# Patient Record
Sex: Male | Born: 1950 | Race: Black or African American | Hispanic: No | Smoking: Never smoker
Health system: Southern US, Community
[De-identification: ages and names within clinical notes are randomized; demographics above are authoritative.]

## PROBLEM LIST (undated history)

## (undated) DIAGNOSIS — I639 Cerebral infarction, unspecified: Secondary | ICD-10-CM

## (undated) DIAGNOSIS — S0990XA Unspecified injury of head, initial encounter: Secondary | ICD-10-CM

## (undated) DIAGNOSIS — F1010 Alcohol abuse, uncomplicated: Secondary | ICD-10-CM

## (undated) DIAGNOSIS — R011 Cardiac murmur, unspecified: Secondary | ICD-10-CM

## (undated) DIAGNOSIS — I4891 Unspecified atrial fibrillation: Secondary | ICD-10-CM

## (undated) DIAGNOSIS — G913 Post-traumatic hydrocephalus, unspecified: Secondary | ICD-10-CM

## (undated) DIAGNOSIS — I82409 Acute embolism and thrombosis of unspecified deep veins of unspecified lower extremity: Secondary | ICD-10-CM

## (undated) DIAGNOSIS — R569 Unspecified convulsions: Secondary | ICD-10-CM

## (undated) DIAGNOSIS — I10 Essential (primary) hypertension: Secondary | ICD-10-CM

## (undated) HISTORY — PX: IM NAILING TIBIA: SUR734

## (undated) HISTORY — PX: ANTERIOR CERVICAL DECOMP/DISCECTOMY FUSION: SHX1161

## (undated) HISTORY — PX: FRACTURE SURGERY: SHX138

## (undated) HISTORY — PX: BACK SURGERY: SHX140

## (undated) HISTORY — PX: TIBIA FRACTURE SURGERY: SHX806

---

## 1998-02-04 ENCOUNTER — Emergency Department (HOSPITAL_COMMUNITY): Admission: EM | Admit: 1998-02-04 | Discharge: 1998-02-04 | Payer: Self-pay | Admitting: Emergency Medicine

## 1998-03-30 ENCOUNTER — Emergency Department (HOSPITAL_COMMUNITY): Admission: EM | Admit: 1998-03-30 | Discharge: 1998-03-31 | Payer: Self-pay

## 1998-03-31 ENCOUNTER — Ambulatory Visit (HOSPITAL_COMMUNITY): Admission: RE | Admit: 1998-03-31 | Discharge: 1998-03-31 | Payer: Self-pay

## 1998-04-06 ENCOUNTER — Ambulatory Visit (HOSPITAL_COMMUNITY): Admission: RE | Admit: 1998-04-06 | Discharge: 1998-04-06 | Payer: Self-pay | Admitting: Urology

## 1998-04-13 ENCOUNTER — Ambulatory Visit (HOSPITAL_COMMUNITY): Admission: RE | Admit: 1998-04-13 | Discharge: 1998-04-13 | Payer: Self-pay | Admitting: Urology

## 1998-04-13 ENCOUNTER — Encounter: Payer: Self-pay | Admitting: Urology

## 2003-01-31 ENCOUNTER — Encounter: Payer: Self-pay | Admitting: Emergency Medicine

## 2003-01-31 ENCOUNTER — Emergency Department (HOSPITAL_COMMUNITY): Admission: EM | Admit: 2003-01-31 | Discharge: 2003-02-01 | Payer: Self-pay | Admitting: *Deleted

## 2003-02-01 ENCOUNTER — Encounter: Payer: Self-pay | Admitting: *Deleted

## 2003-02-13 ENCOUNTER — Emergency Department (HOSPITAL_COMMUNITY): Admission: EM | Admit: 2003-02-13 | Discharge: 2003-02-13 | Payer: Self-pay | Admitting: Emergency Medicine

## 2003-03-02 ENCOUNTER — Encounter: Payer: Self-pay | Admitting: Emergency Medicine

## 2003-03-02 ENCOUNTER — Emergency Department (HOSPITAL_COMMUNITY): Admission: EM | Admit: 2003-03-02 | Discharge: 2003-03-02 | Payer: Self-pay | Admitting: Emergency Medicine

## 2004-09-26 HISTORY — PX: INCISION AND DRAINAGE: SHX5863

## 2004-09-27 ENCOUNTER — Inpatient Hospital Stay (HOSPITAL_COMMUNITY): Admission: EM | Admit: 2004-09-27 | Discharge: 2004-10-01 | Payer: Self-pay | Admitting: Emergency Medicine

## 2005-07-29 HISTORY — PX: VENA CAVA FILTER PLACEMENT: SUR1032
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
## 2006-05-13 ENCOUNTER — Inpatient Hospital Stay (HOSPITAL_COMMUNITY): Admission: AC | Admit: 2006-05-13 | Discharge: 2006-06-11 | Payer: Self-pay

## 2006-05-28 ENCOUNTER — Ambulatory Visit: Payer: Self-pay | Admitting: Physical Medicine & Rehabilitation

## 2006-05-29 DIAGNOSIS — S0990XA Unspecified injury of head, initial encounter: Secondary | ICD-10-CM

## 2006-05-29 HISTORY — DX: Unspecified injury of head, initial encounter: S09.90XA

## 2006-06-11 ENCOUNTER — Inpatient Hospital Stay (HOSPITAL_COMMUNITY)
Admission: RE | Admit: 2006-06-11 | Discharge: 2006-07-04 | Payer: Self-pay | Admitting: Physical Medicine & Rehabilitation

## 2006-06-11 ENCOUNTER — Ambulatory Visit: Payer: Self-pay | Admitting: Physical Medicine & Rehabilitation

## 2006-06-28 DIAGNOSIS — I82409 Acute embolism and thrombosis of unspecified deep veins of unspecified lower extremity: Secondary | ICD-10-CM

## 2006-06-28 HISTORY — DX: Acute embolism and thrombosis of unspecified deep veins of unspecified lower extremity: I82.409

## 2006-07-13 ENCOUNTER — Emergency Department (HOSPITAL_COMMUNITY): Admission: EM | Admit: 2006-07-13 | Discharge: 2006-07-14 | Payer: Self-pay | Admitting: Emergency Medicine

## 2006-07-15 ENCOUNTER — Inpatient Hospital Stay (HOSPITAL_COMMUNITY): Admission: EM | Admit: 2006-07-15 | Discharge: 2006-08-05 | Payer: Self-pay | Admitting: Emergency Medicine

## 2006-07-21 ENCOUNTER — Encounter: Payer: Self-pay | Admitting: Vascular Surgery

## 2006-08-25 ENCOUNTER — Encounter: Admission: RE | Admit: 2006-08-25 | Discharge: 2006-08-25 | Payer: Self-pay | Admitting: Internal Medicine

## 2006-11-27 DIAGNOSIS — G913 Post-traumatic hydrocephalus, unspecified: Secondary | ICD-10-CM

## 2006-11-27 HISTORY — PX: CSF SHUNT: SHX92

## 2006-11-27 HISTORY — DX: Post-traumatic hydrocephalus, unspecified: G91.3

## 2006-12-03 ENCOUNTER — Encounter: Admission: RE | Admit: 2006-12-03 | Discharge: 2006-12-03 | Payer: Self-pay | Admitting: Neurosurgery

## 2006-12-19 ENCOUNTER — Inpatient Hospital Stay (HOSPITAL_COMMUNITY): Admission: RE | Admit: 2006-12-19 | Discharge: 2006-12-21 | Payer: Self-pay | Admitting: Neurosurgery

## 2007-04-22 ENCOUNTER — Emergency Department (HOSPITAL_COMMUNITY): Admission: EM | Admit: 2007-04-22 | Discharge: 2007-04-23 | Payer: Self-pay | Admitting: Emergency Medicine

## 2007-05-25 ENCOUNTER — Emergency Department (HOSPITAL_COMMUNITY): Admission: EM | Admit: 2007-05-25 | Discharge: 2007-05-26 | Payer: Self-pay | Admitting: *Deleted
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
## 2007-05-31 IMAGING — CR DG ABD PORTABLE 1V
1 series · 1 of 1 positions shown · non-contrast
Comparison: none

CLINICAL DATA: Orogastric tube placement

Portable abdomen at 7357:
OG tube extends into the decompressed gastric fundus. Visualized bowel gas
pattern unremarkable. Visualized lung bases clear.

[view not recorded]
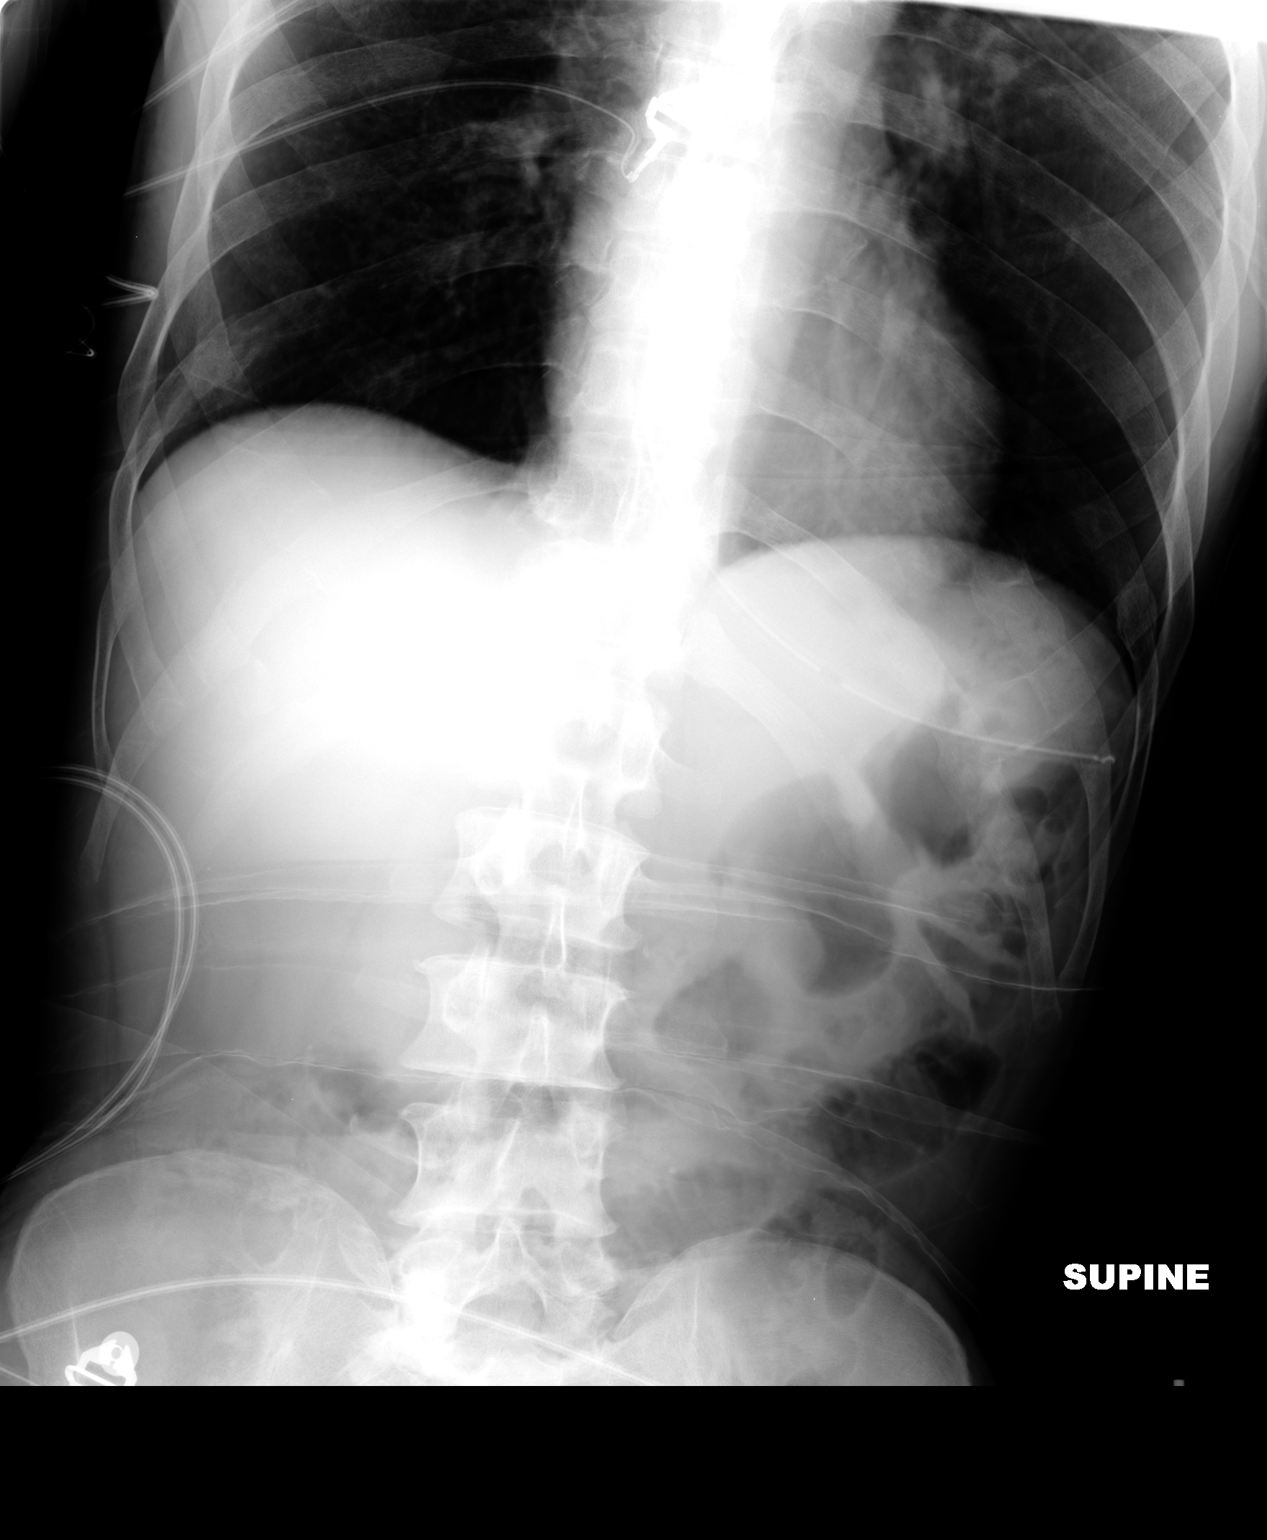

[1 of 1 positions shown; findings below may reference images not displayed]

IMPRESSION: 1. Orogastric tube to the gastric fundus
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
## 2007-06-01 IMAGING — CR DG CHEST 1V PORT
1 series · 1 of 1 positions shown · non-contrast
Comparison: none

CLINICAL DATA: PICC line placement.
PORTABLE SEMIERECT CHEST - 1 VIEW - 05/15/06 AT [DATE] P.M.:
Comparison Examination:   05/15/06 at [DATE] a.m.

[view not recorded]
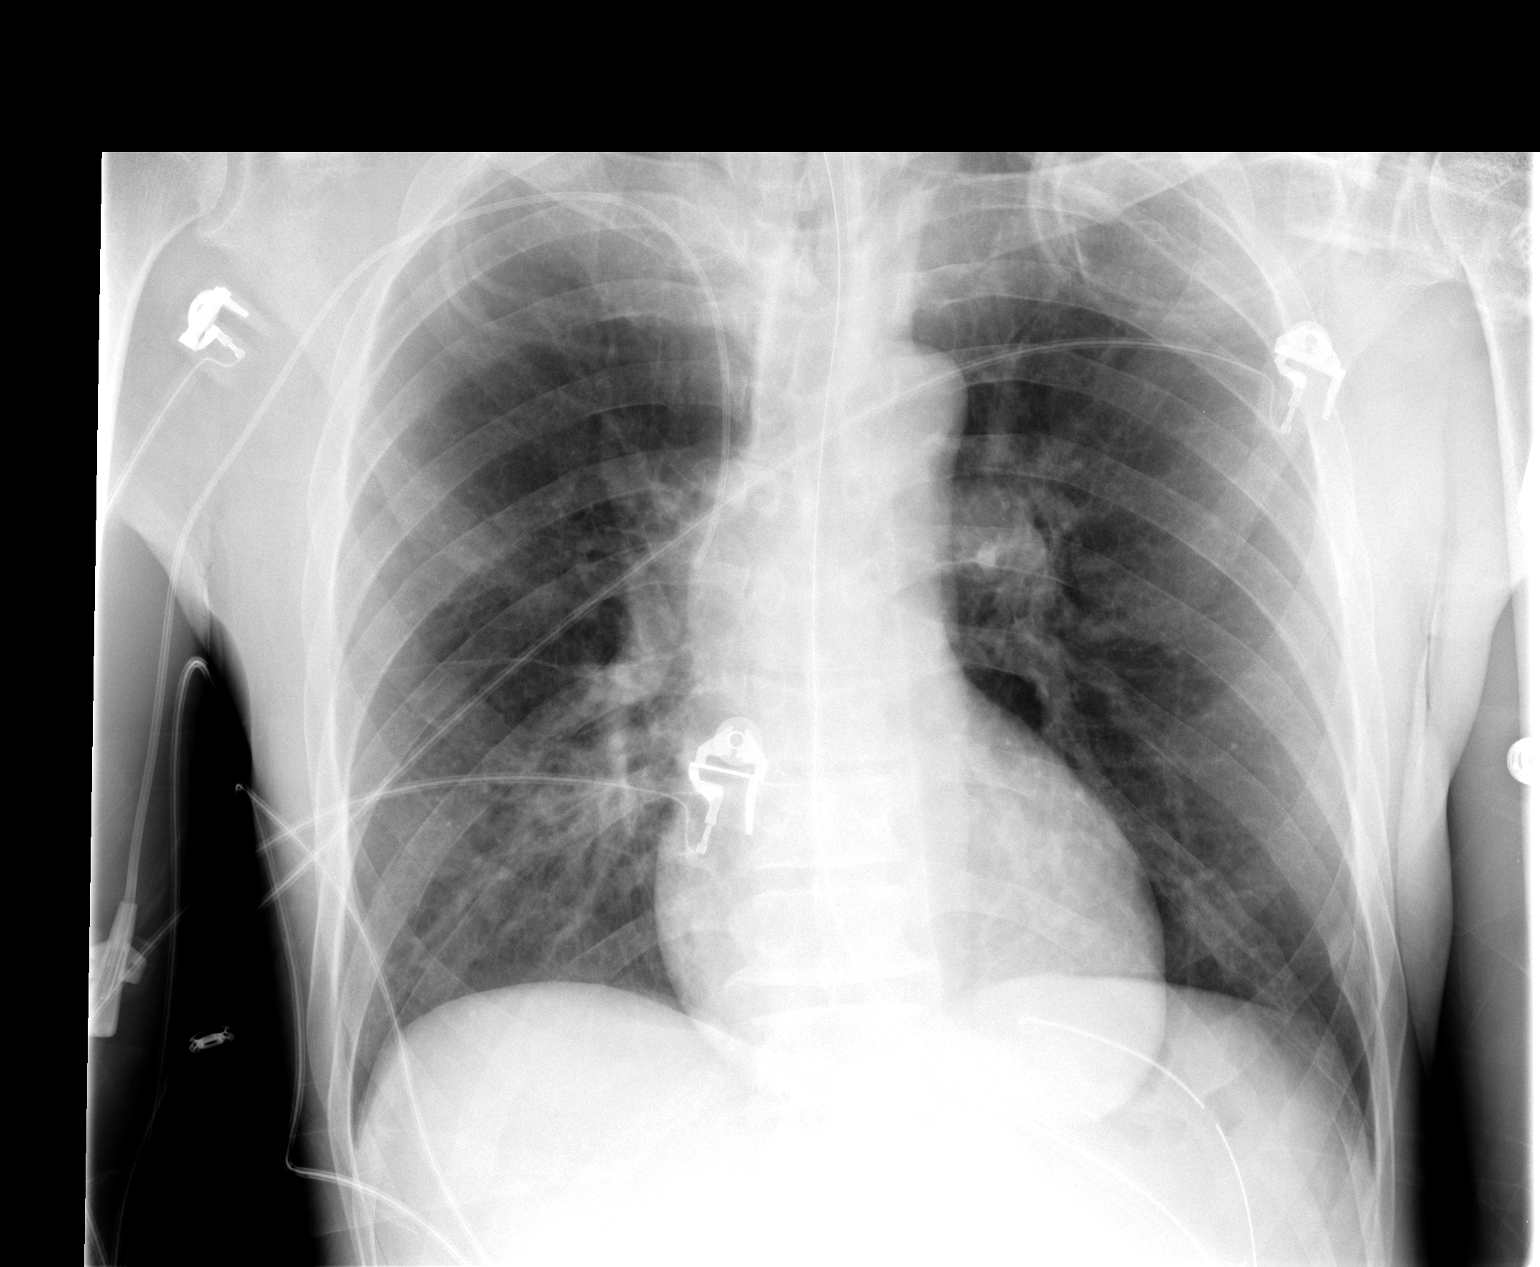

[1 of 1 positions shown; findings below may reference images not displayed]

FINDINGS: ight PICC line tip is at the mid superior vena cava level.  Now better visualized is the well-defined left heart border.  This raises the possibility of subtle pneumomediastinum or medially located left-sided pneumothorax.  This could be evaluated on close follow-up.  Remaining findings are without change.  The floor was contacted.
IMPRESSION: 1.  Right PICC line tip in mid superior vena cava.  
2.  Well-demarcated left heart border raising the possibility of a small left pneumomediastinum/pneumothorax and can be further delineated on close follow-up.
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
## 2007-06-06 IMAGING — CR DG CHEST 1V PORT
1 series · 1 of 1 positions shown · non-contrast
Comparison: 05/19/06.

CLINICAL DATA: Head injury,
 PORTABLE CHEST - 1 VIEW, 1411 HOURS:

[view not recorded]
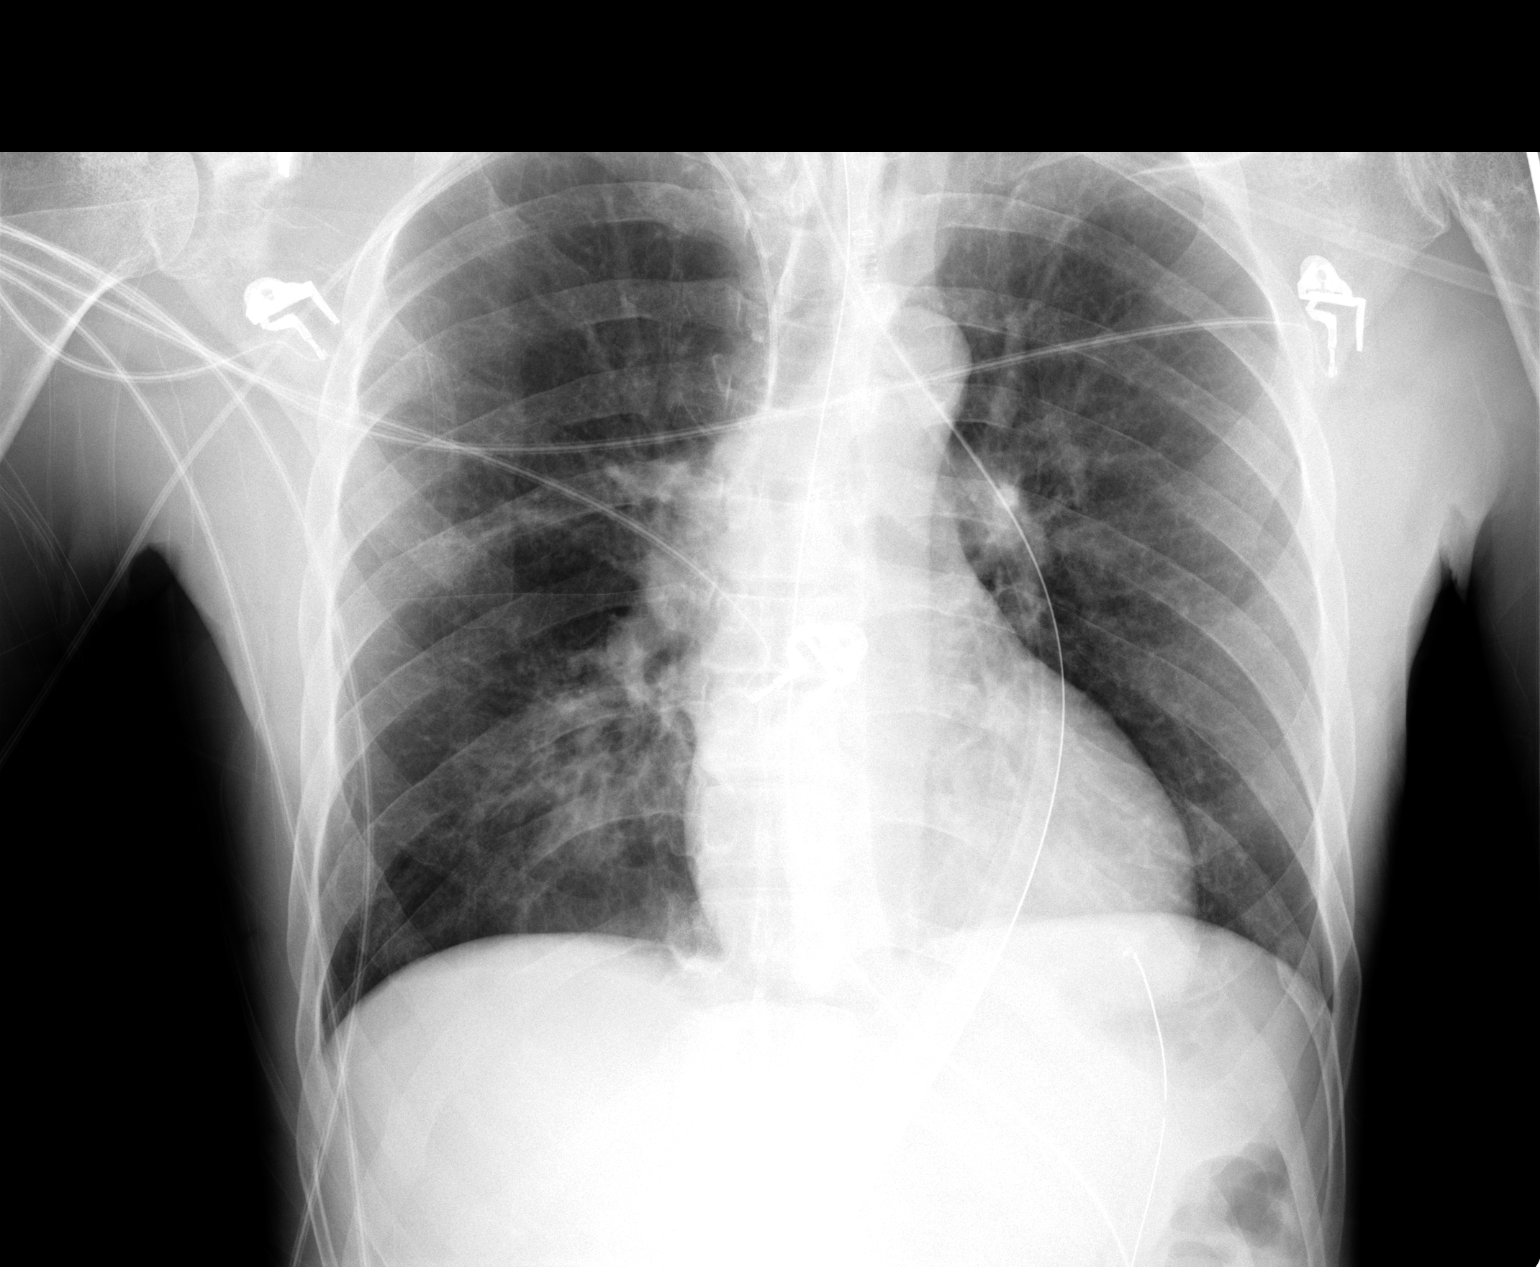

[1 of 1 positions shown; findings below may reference images not displayed]

FINDINGS: Endotracheal tube is in good position.  Central line tip remains in the SVC.  There is no pneumothorax.  NG is coiled in the stomach.  
 There is improved aeration in the bases with less atelectasis.  There remains minimal bibasilar atelectasis.
IMPRESSION: Improved aeration with bibasilar atelectasis present.
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
## 2007-06-08 IMAGING — CR DG CHEST 1V PORT
1 series · 1 of 1 positions shown · non-contrast
Comparison: Comparison made to yesterday?s exam.

CLINICAL DATA: Head injury.  On ventilator.
 PORTABLE CHEST ? 1 VIEW ? 05/22/06 AT 9099 HOURS:

[view not recorded]
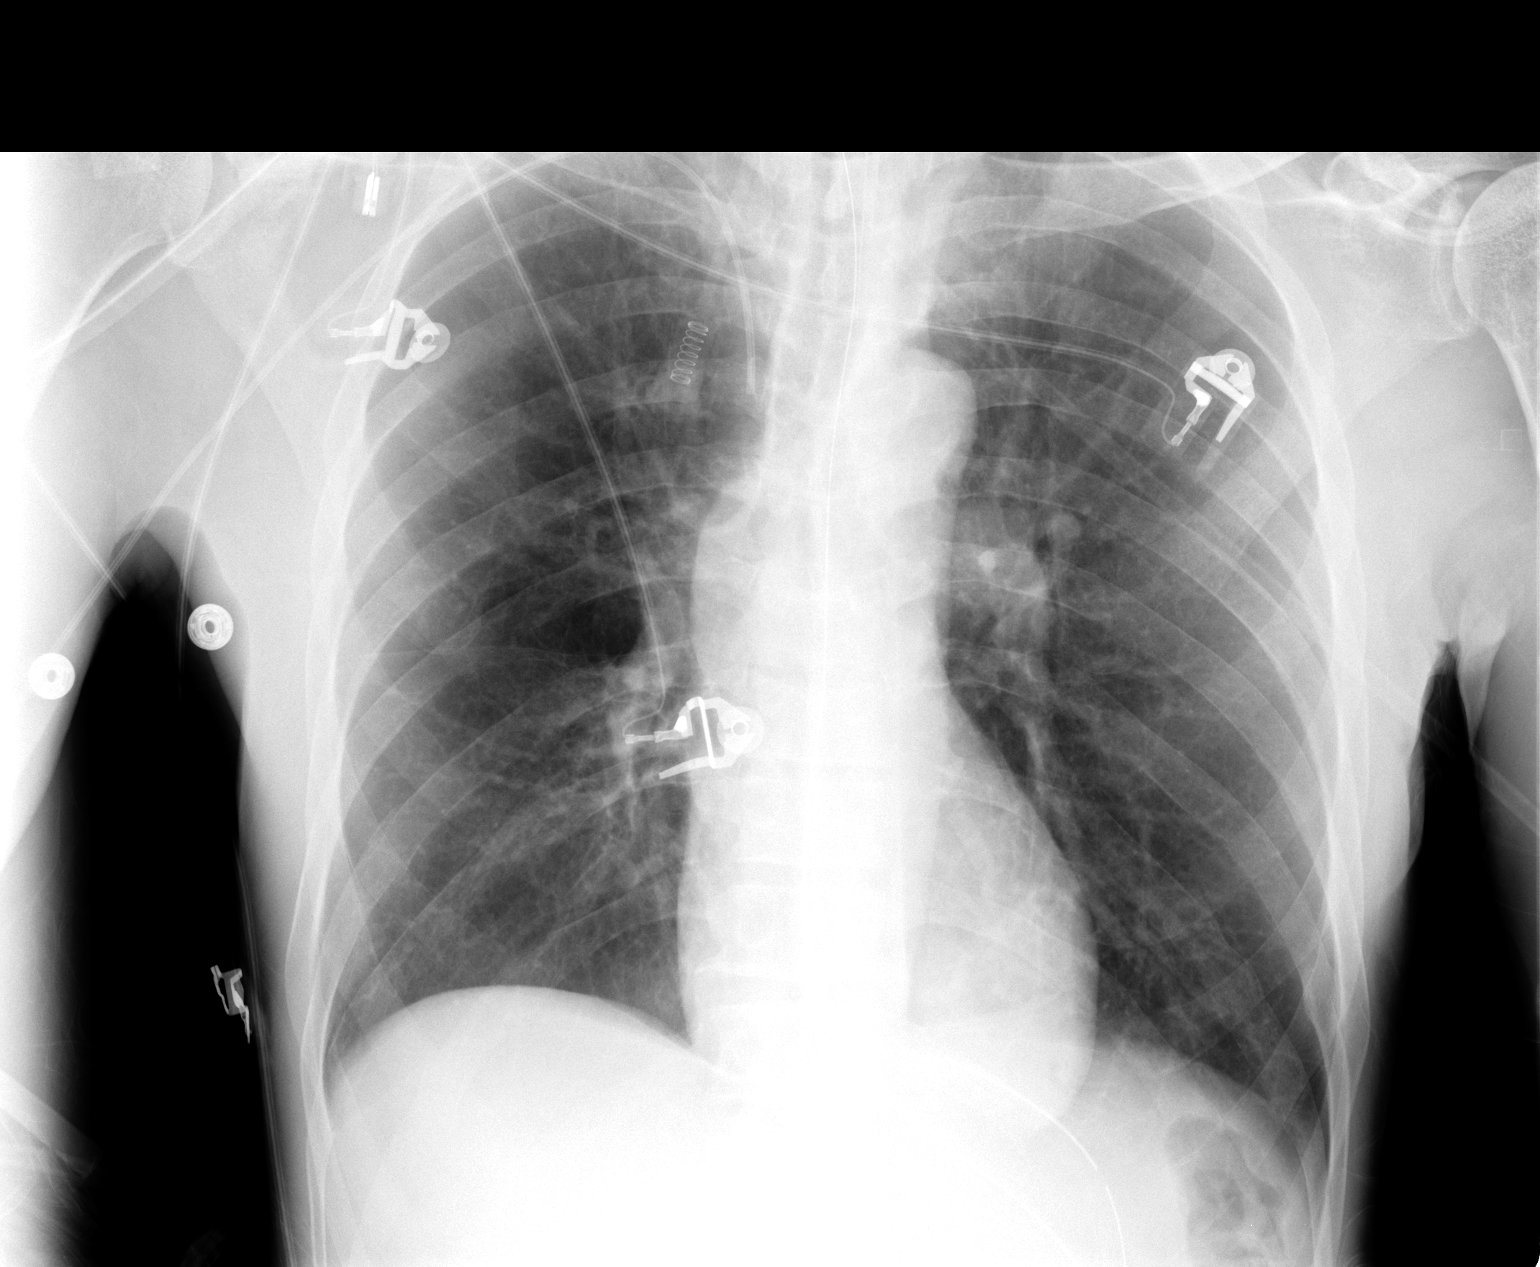

[1 of 1 positions shown; findings below may reference images not displayed]

FINDINGS: Improvement in aeration at the lung bases.  No pneumothorax.  Satisfactory endotracheal tube position.  NG tube tip is in the fundus of the stomach.  Right upper extremity PICC line tip is in the region of the proximal SVC.
IMPRESSION: Improved aeration at the bases.  No new acute chest finding.
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
## 2007-06-12 IMAGING — CR DG CHEST 1V PORT
1 series · 1 of 1 positions shown · non-contrast
Comparison: 05/22/06.

CLINICAL DATA: Traumatic brain injury.  On ventilator.  
 PORTABLE CHEST - 1 VIEW:

[view not recorded]
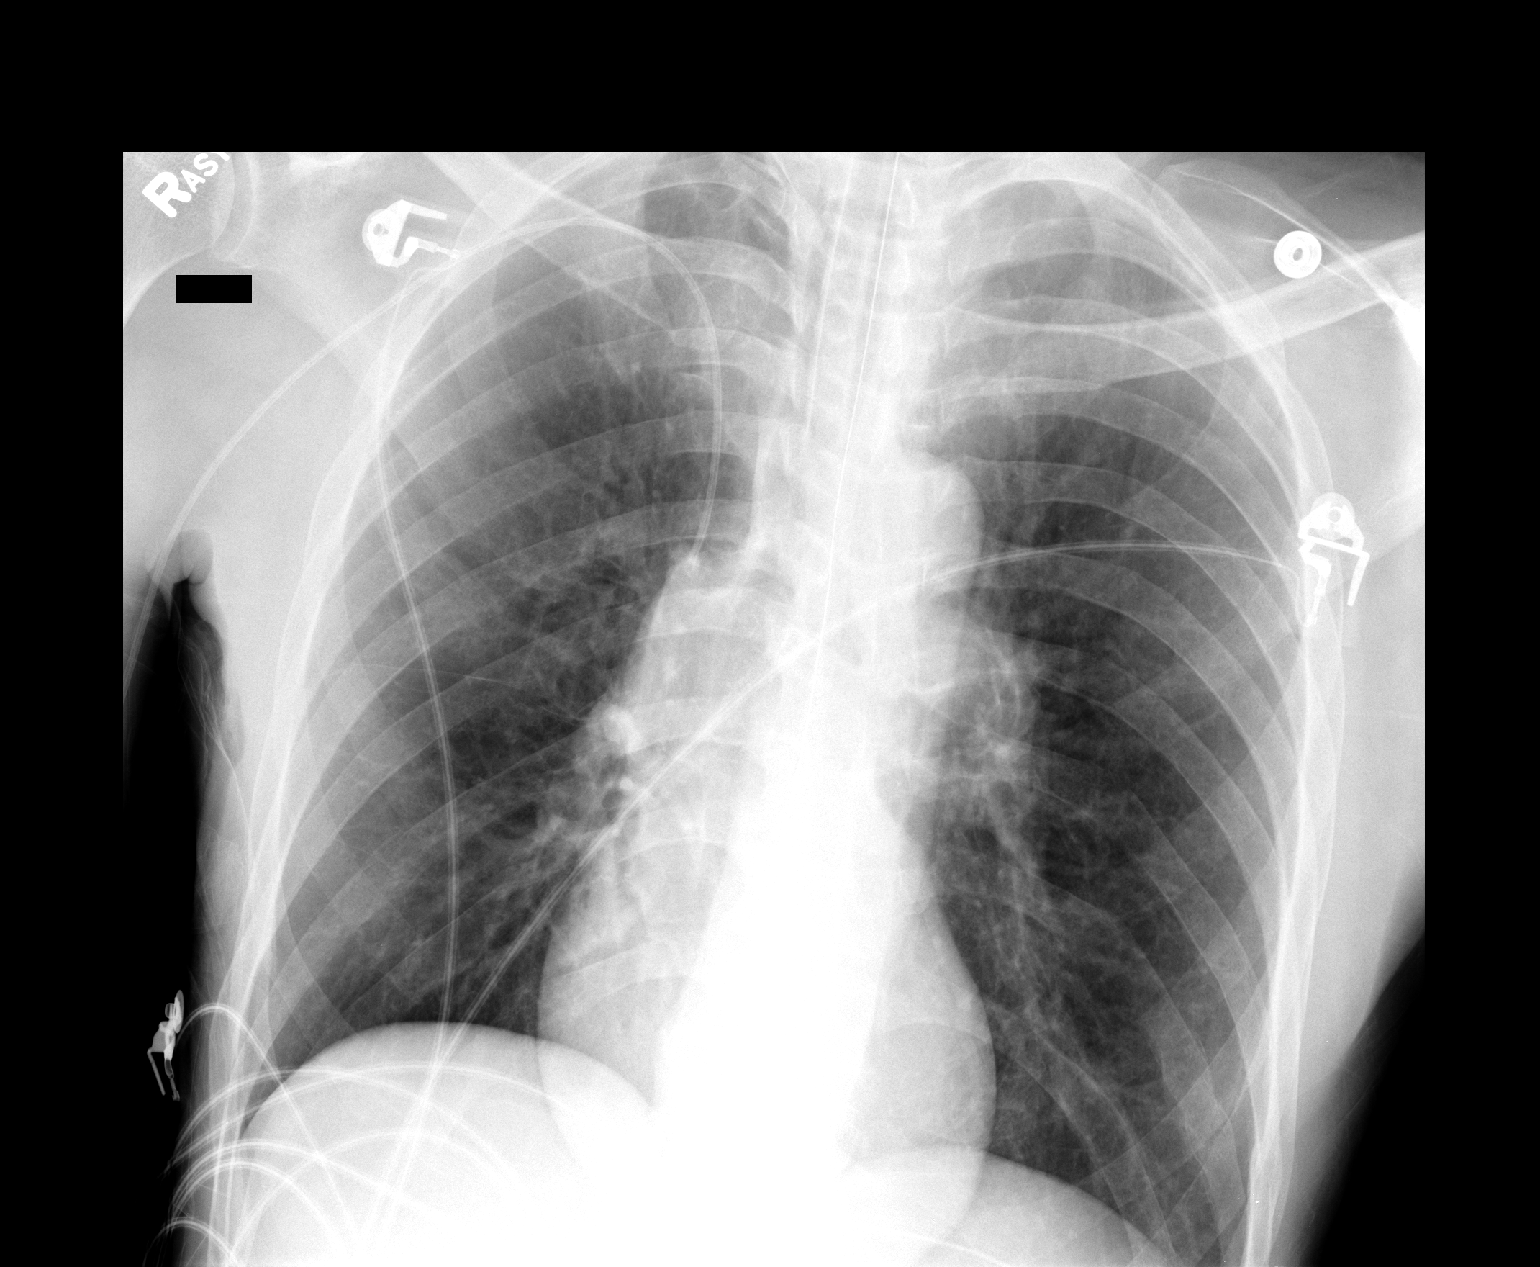

[1 of 1 positions shown; findings below may reference images not displayed]

FINDINGS: Support lines and tubes remain in stable position.  Both lungs are well aerated and are clear.  There is no pneumothorax or pleural effusion.  The heart size is normal.  Left rib fractures are again noted.
IMPRESSION: No active cardiopulmonary disease.   Left rib fractures again noted.

## 2007-06-14 IMAGING — CR DG CHEST 1V PORT
1 series · 1 of 1 positions shown · non-contrast
Comparison: 05/27/06.

CLINICAL DATA: Head injury.  On ventilator.  
 PORTABLE CHEST - 1 VIEW 05/28/06:

[view not recorded]
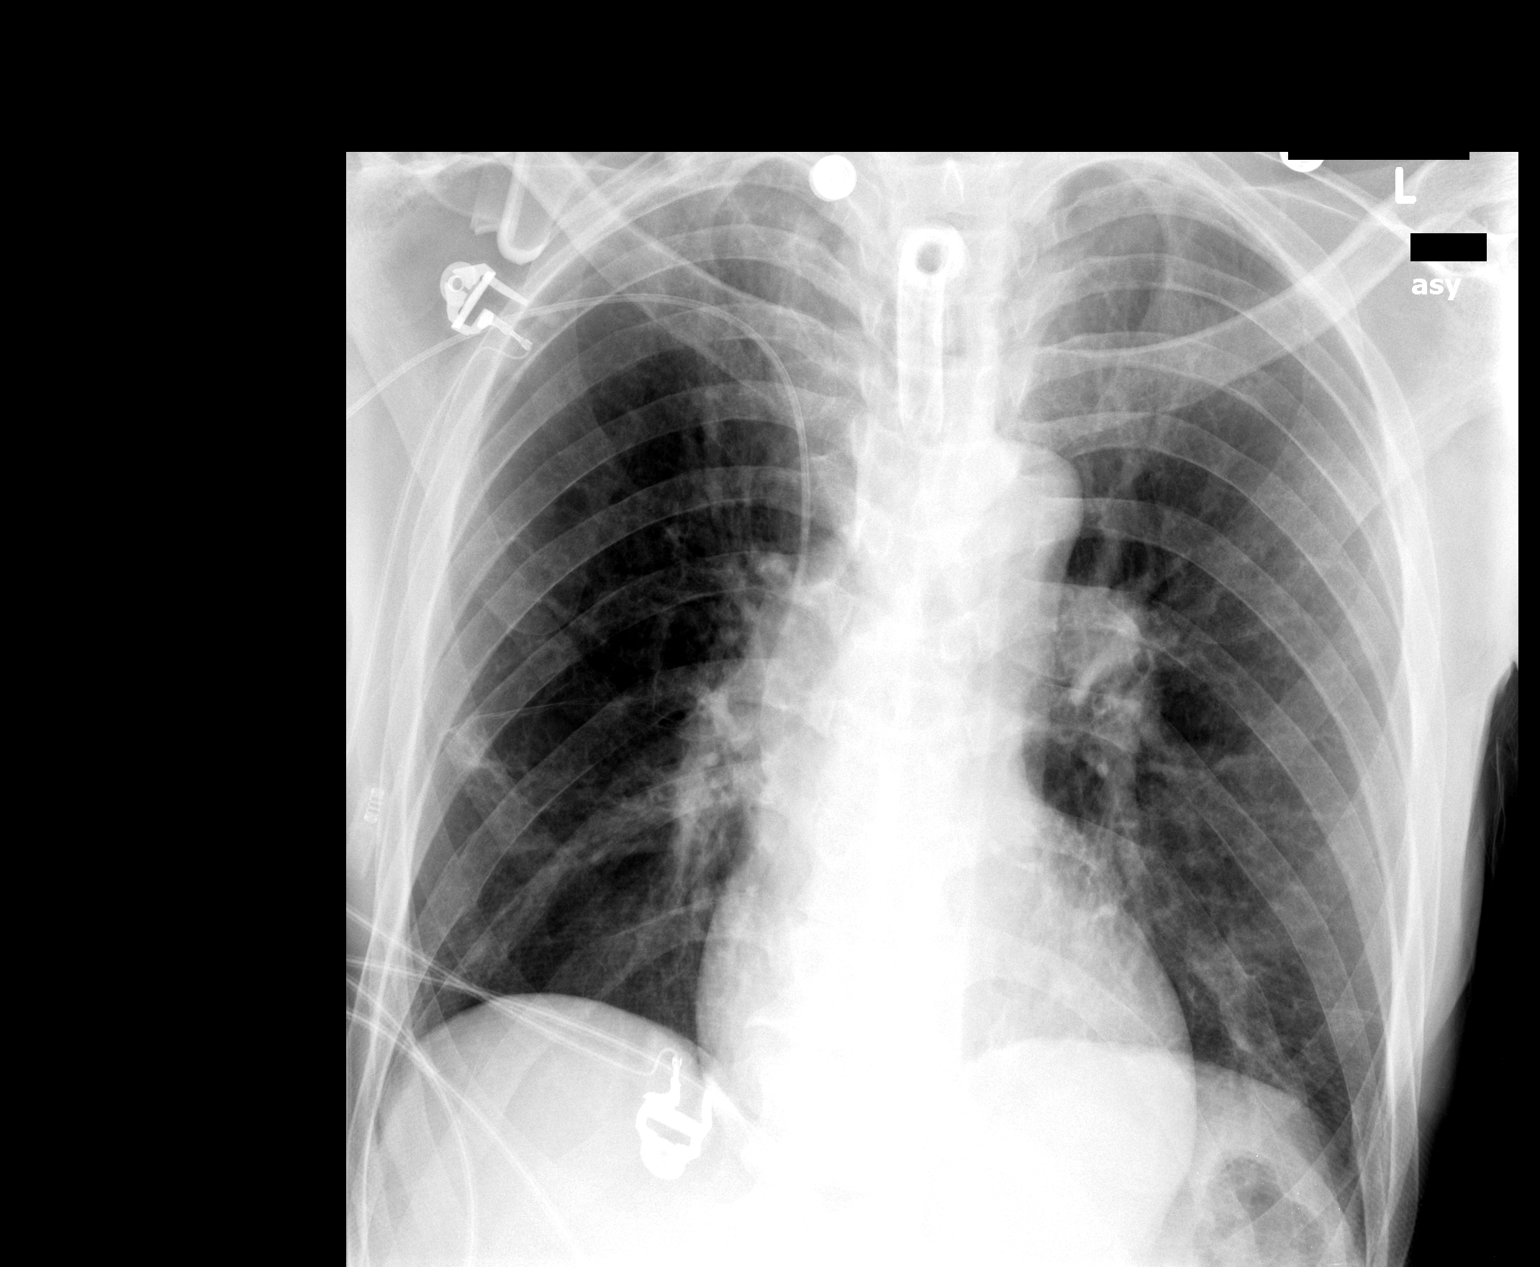

[1 of 1 positions shown; findings below may reference images not displayed]

FINDINGS: There has been development of mild atelectasis in the right mid lung and left lung base since the prior study.  There is no evidence of pulmonary consolidation or edema.  There is no evidence of pleural effusion.  Heart size is normal.
IMPRESSION: Development of mild atelectasis in right mid lung and left lung base.

## 2007-06-15 IMAGING — CT CT HEAD W/O CM
1 of 2 series · 13 of 30 positions shown, 17 images · IV contrast (agent unspecified)
Comparison: 05/21/06.

CLINICAL DATA: Followup intracranial hemorrhage.  
 HEAD CT WITHOUT CONTRAST:
TECHNIQUE: Contiguous axial images were obtained from the base of the skull through the vertex according to standard protocol without contrast.

[Series 2: brain · axial · 0.49mm/px · z∈[+141,+276]mm · 13 of 32 slices shown, 17 images]
[im 3/32  brain]
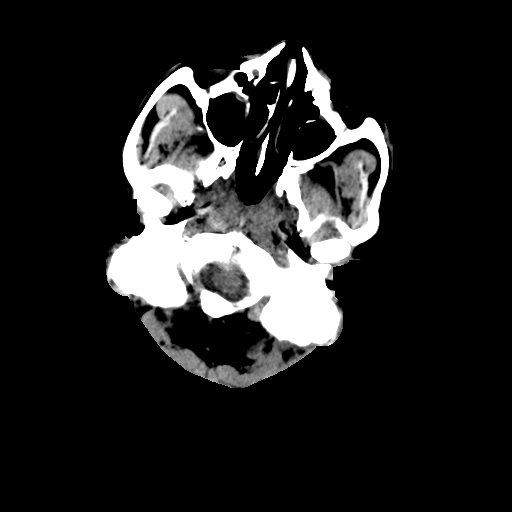
[im 3/32  bone]
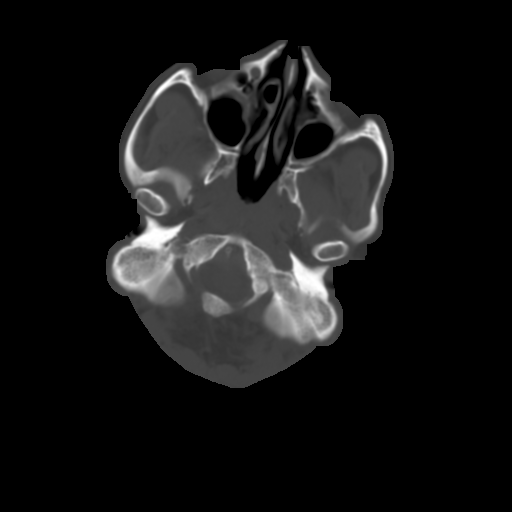
[im 5/32  brain]
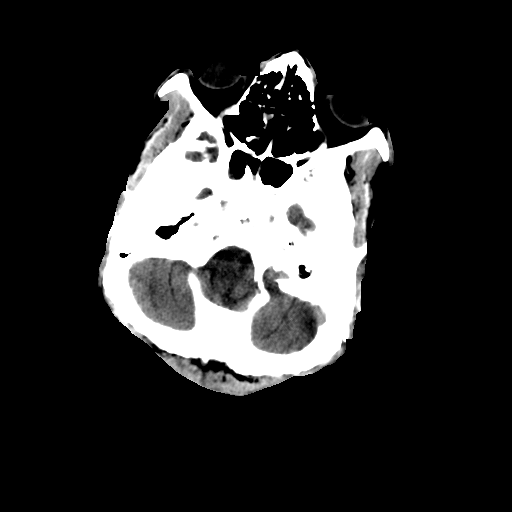
[im 7/32  brain]
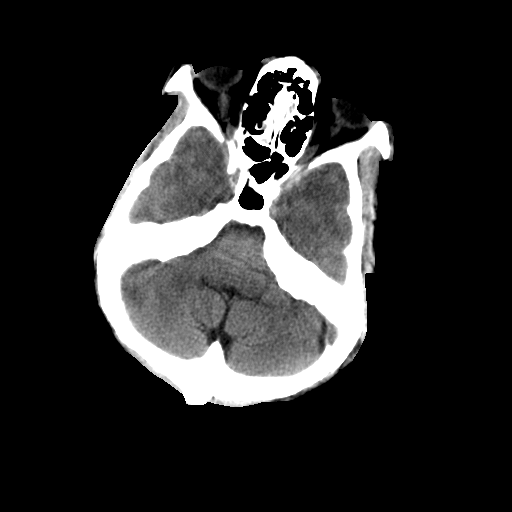
[im 9/32  brain]
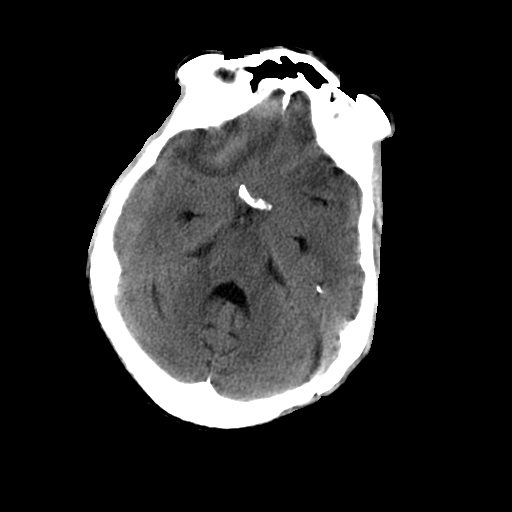
[im 12/32  brain]
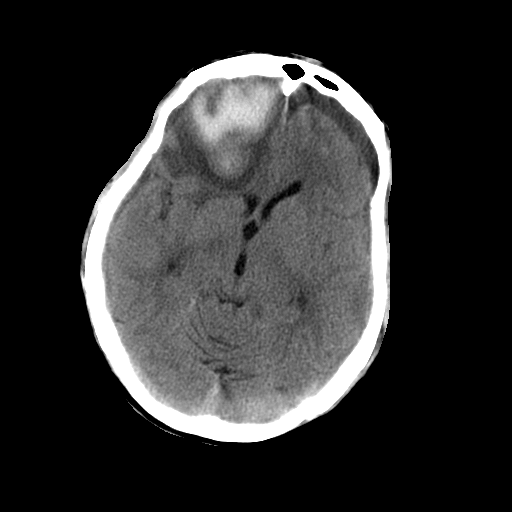
[im 12/32  bone]
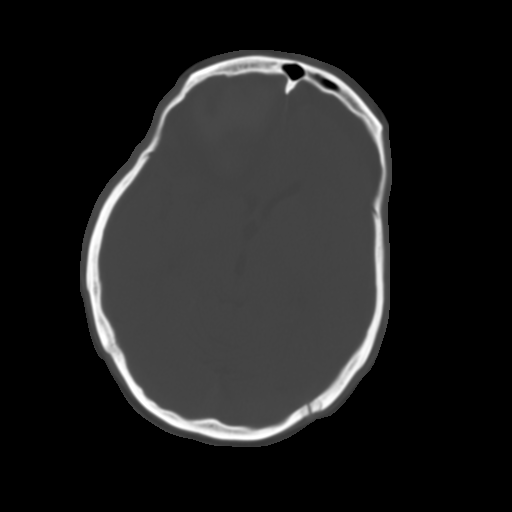
[im 14/32  brain]
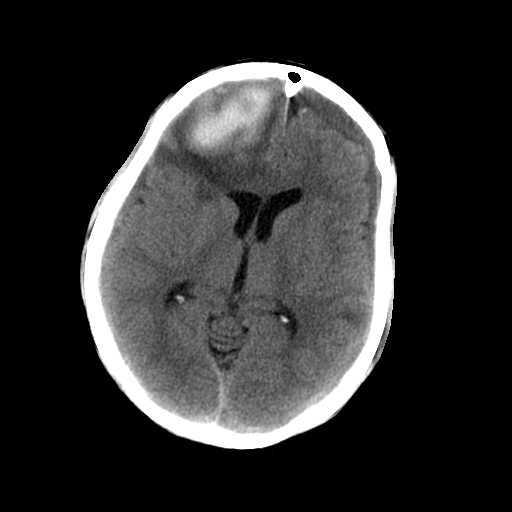
[im 16/32  brain]
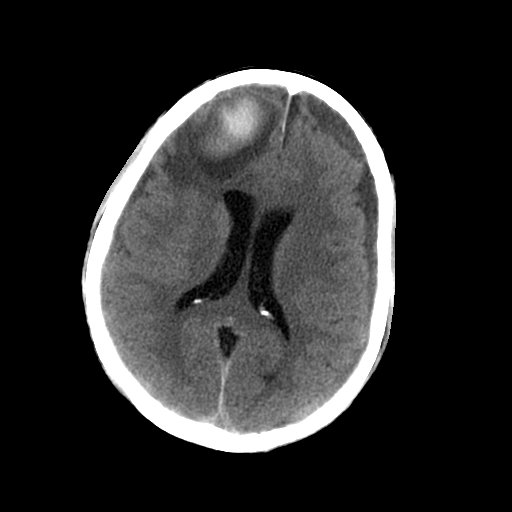
[im 18/32  brain]
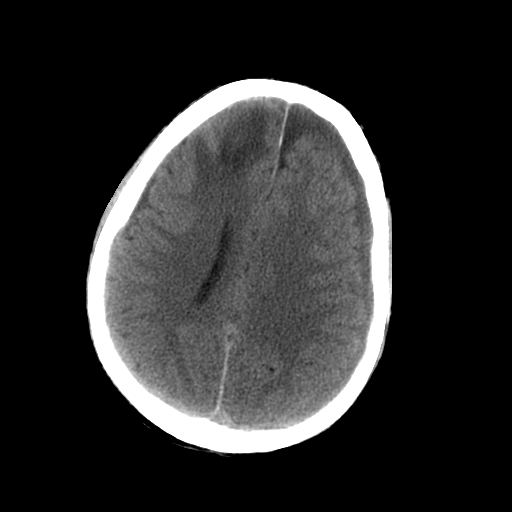
[im 20/32  brain]
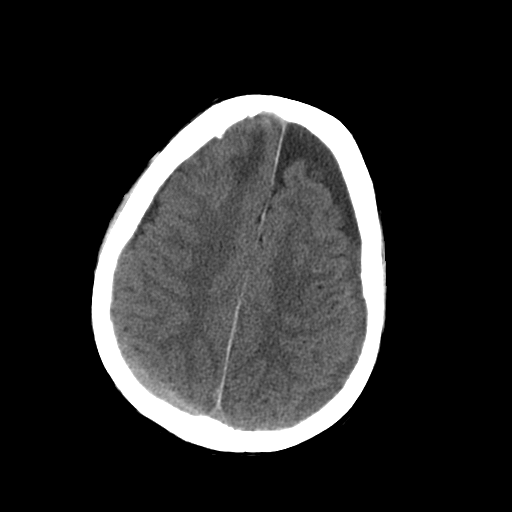
[im 20/32  bone]
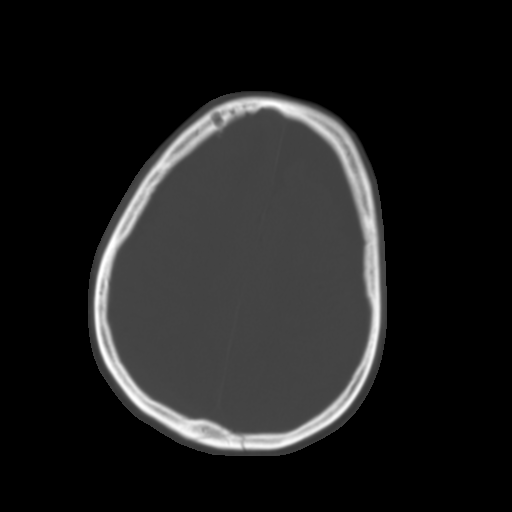
[im 23/32  brain]
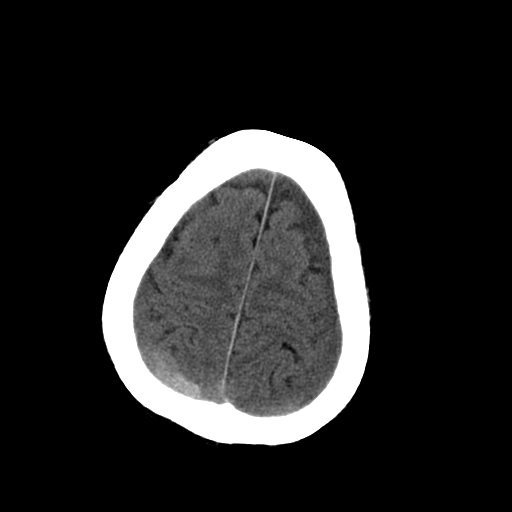
[im 25/32  brain]
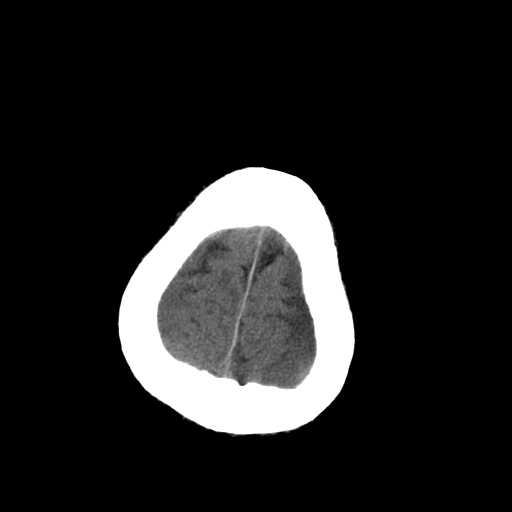
[im 27/32  brain]
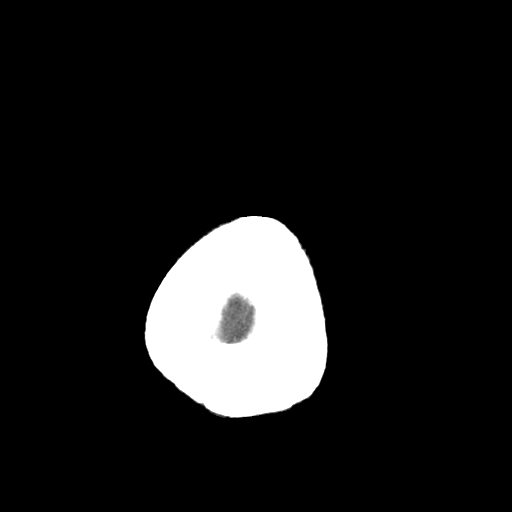
[im 29/32  brain]
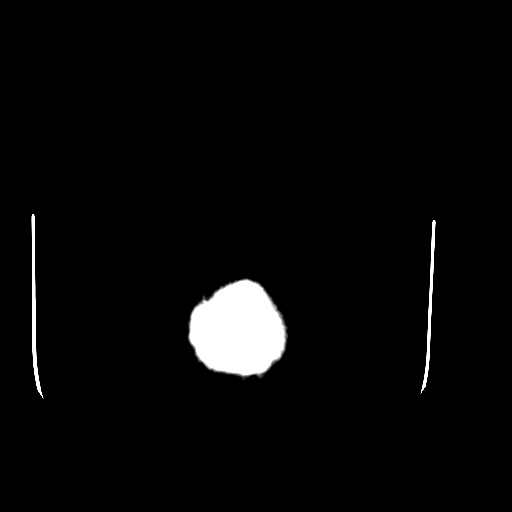
[im 29/32  bone]
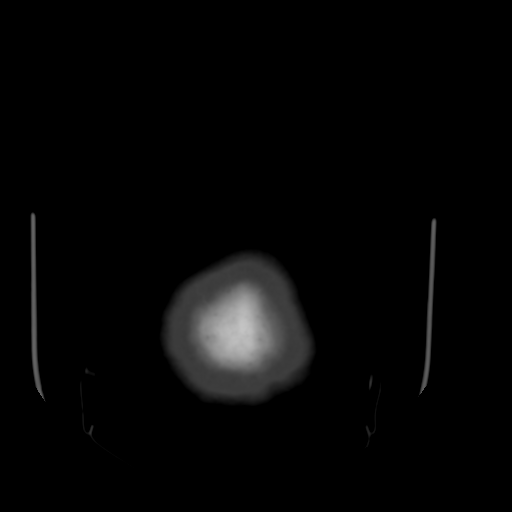

[13 of 30 positions shown; findings below may reference images not displayed]

FINDINGS: Small aging bilateral subdural hematomas are seen which are not significantly changed in size.  Resolution of intracranial gas is noted since prior study.  Decreased size of hemorrhage contusion in the left parietal lobe is noted.  Large intraparenchymal hemorrhage and surrounding edema in the right frontal lobe shows mild decrease in size with decreased mass effect on the right frontal horn.  Mild right to left midline shift has not significantly changed.  Mild increase in ventricular size is seen since prior study although no intraventricular hemorrhage is seen.  Bilateral temporal bone fractures are again noted.
IMPRESSION: 1.  Decreased size of intraparenchymal contusions and associated edema in the right frontal lobe and left parietal lobe. 
 2.  Stable size of aging bilateral subdural hematomas. 
 3.  Mild increase in ventricular size. 
 4.  Bilateral temporal bone fractures again noted.

## 2007-06-15 IMAGING — CR DG CHEST 1V PORT
1 series · 1 of 1 positions shown · non-contrast
Comparison: 05/28/06.

CLINICAL DATA: Traumatic brain injury.  On ventilator.
 PORTABLE CHEST - 1 VIEW:

[view not recorded]
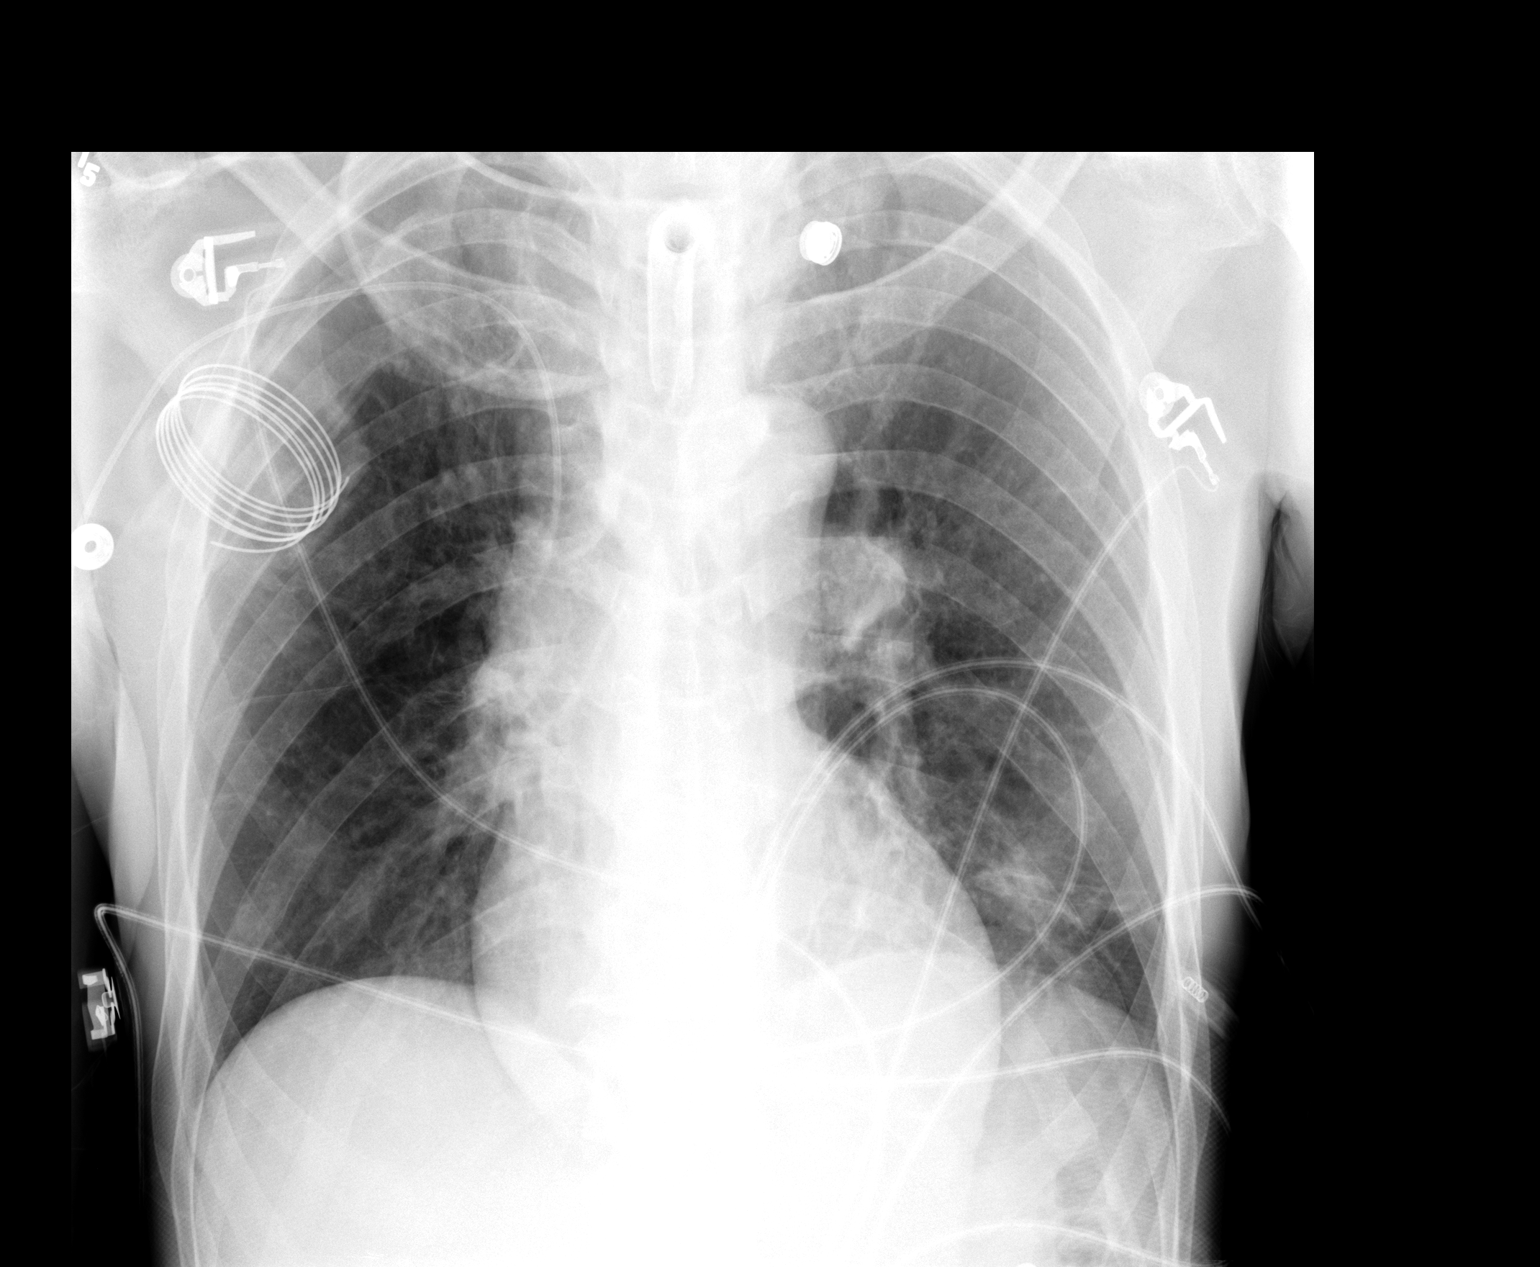

[1 of 1 positions shown; findings below may reference images not displayed]

FINDINGS: Mild worsening of patchy infiltrate is seen in the left lung base.  Right lung remains clear.  Heart size is normal.
IMPRESSION: Mild worsening of left lower lobe infiltrate.

## 2007-06-16 IMAGING — CR DG CHEST 1V PORT
1 series · 1 of 1 positions shown · non-contrast
Comparison: 05/29/06.

CLINICAL DATA: Head injury.  On ventilator
 PORTABLE CHEST ? 1 VIEW:

[view not recorded]
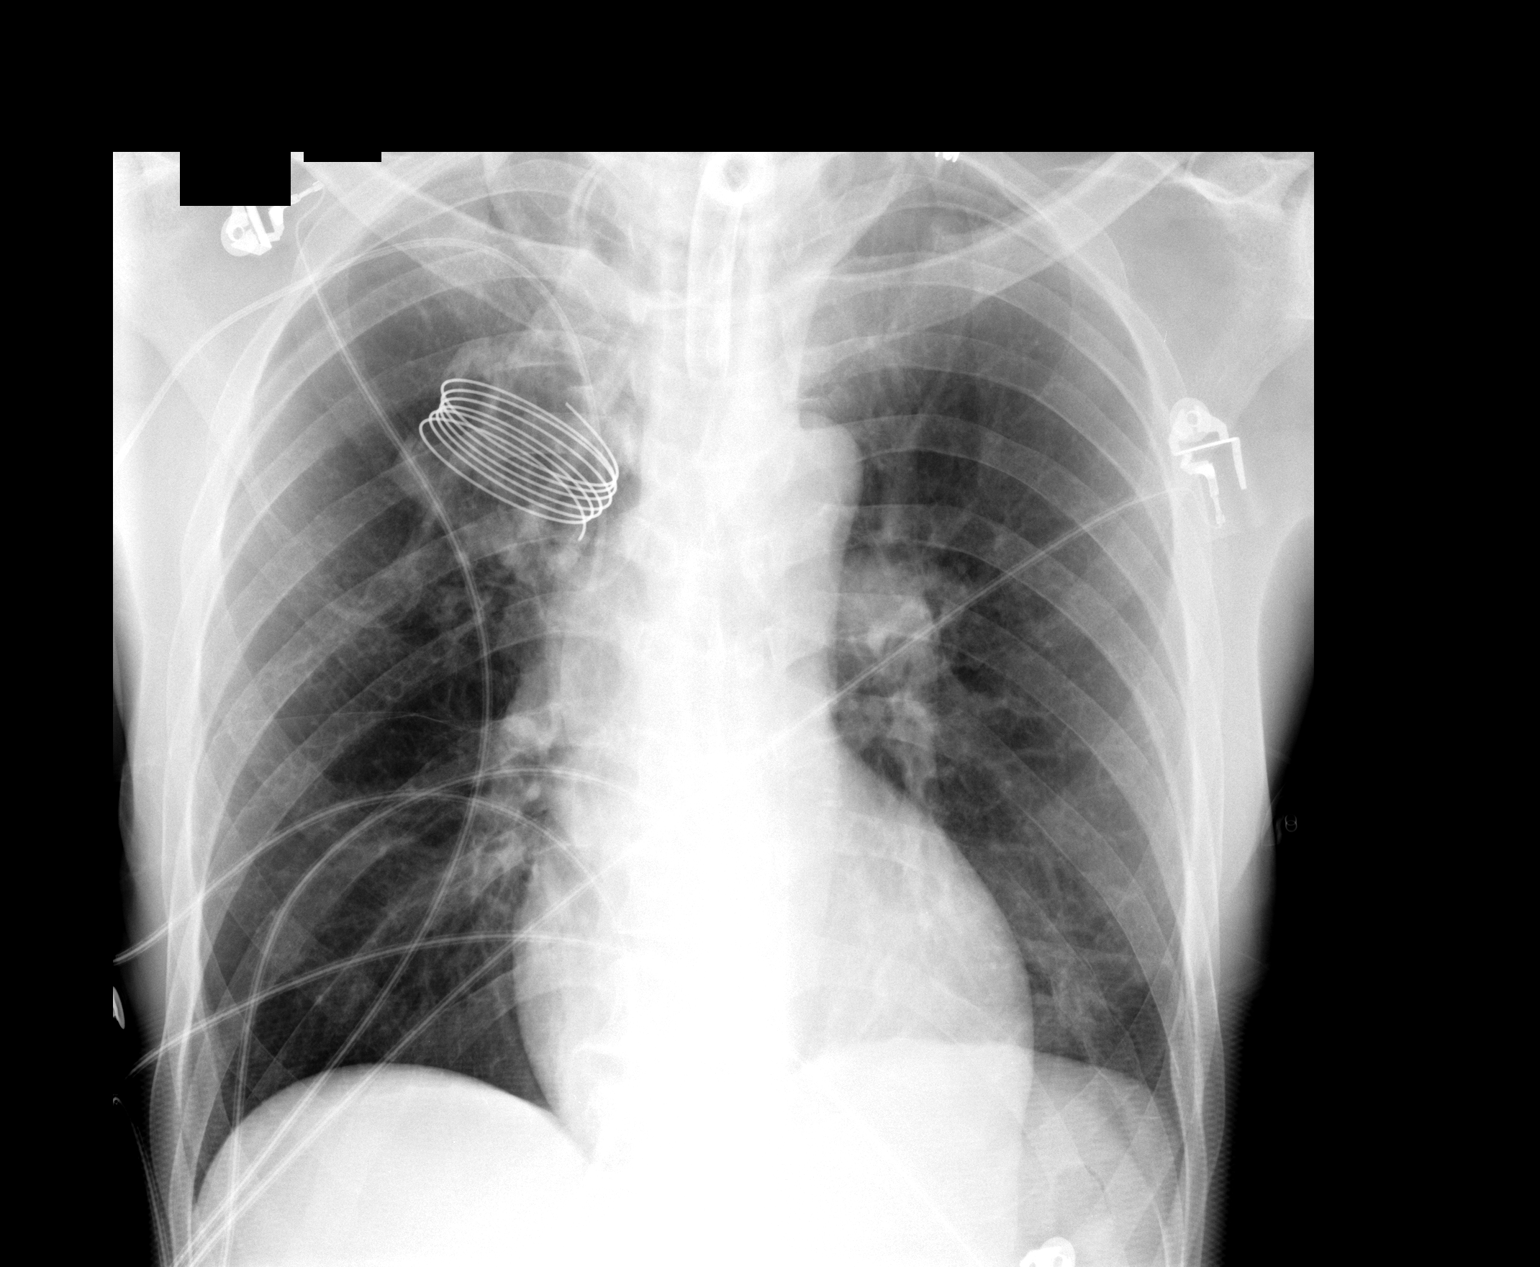

[1 of 1 positions shown; findings below may reference images not displayed]

FINDINGS: Decreased patchy airspace opacity is seen in the left lung base compared with prior study.  The right lung remains clear.  Left lower rib fractures are again noted.
IMPRESSION: Decreased left lower lobe infiltrate.  No new findings.

## 2007-06-19 IMAGING — CR DG CHEST 1V PORT
1 series · 1 of 1 positions shown · non-contrast
Comparison: none

CLINICAL DATA: 55-year-old, with head injury.  
 PORTABLE CHEST ? 1 VIEW:

[view not recorded]
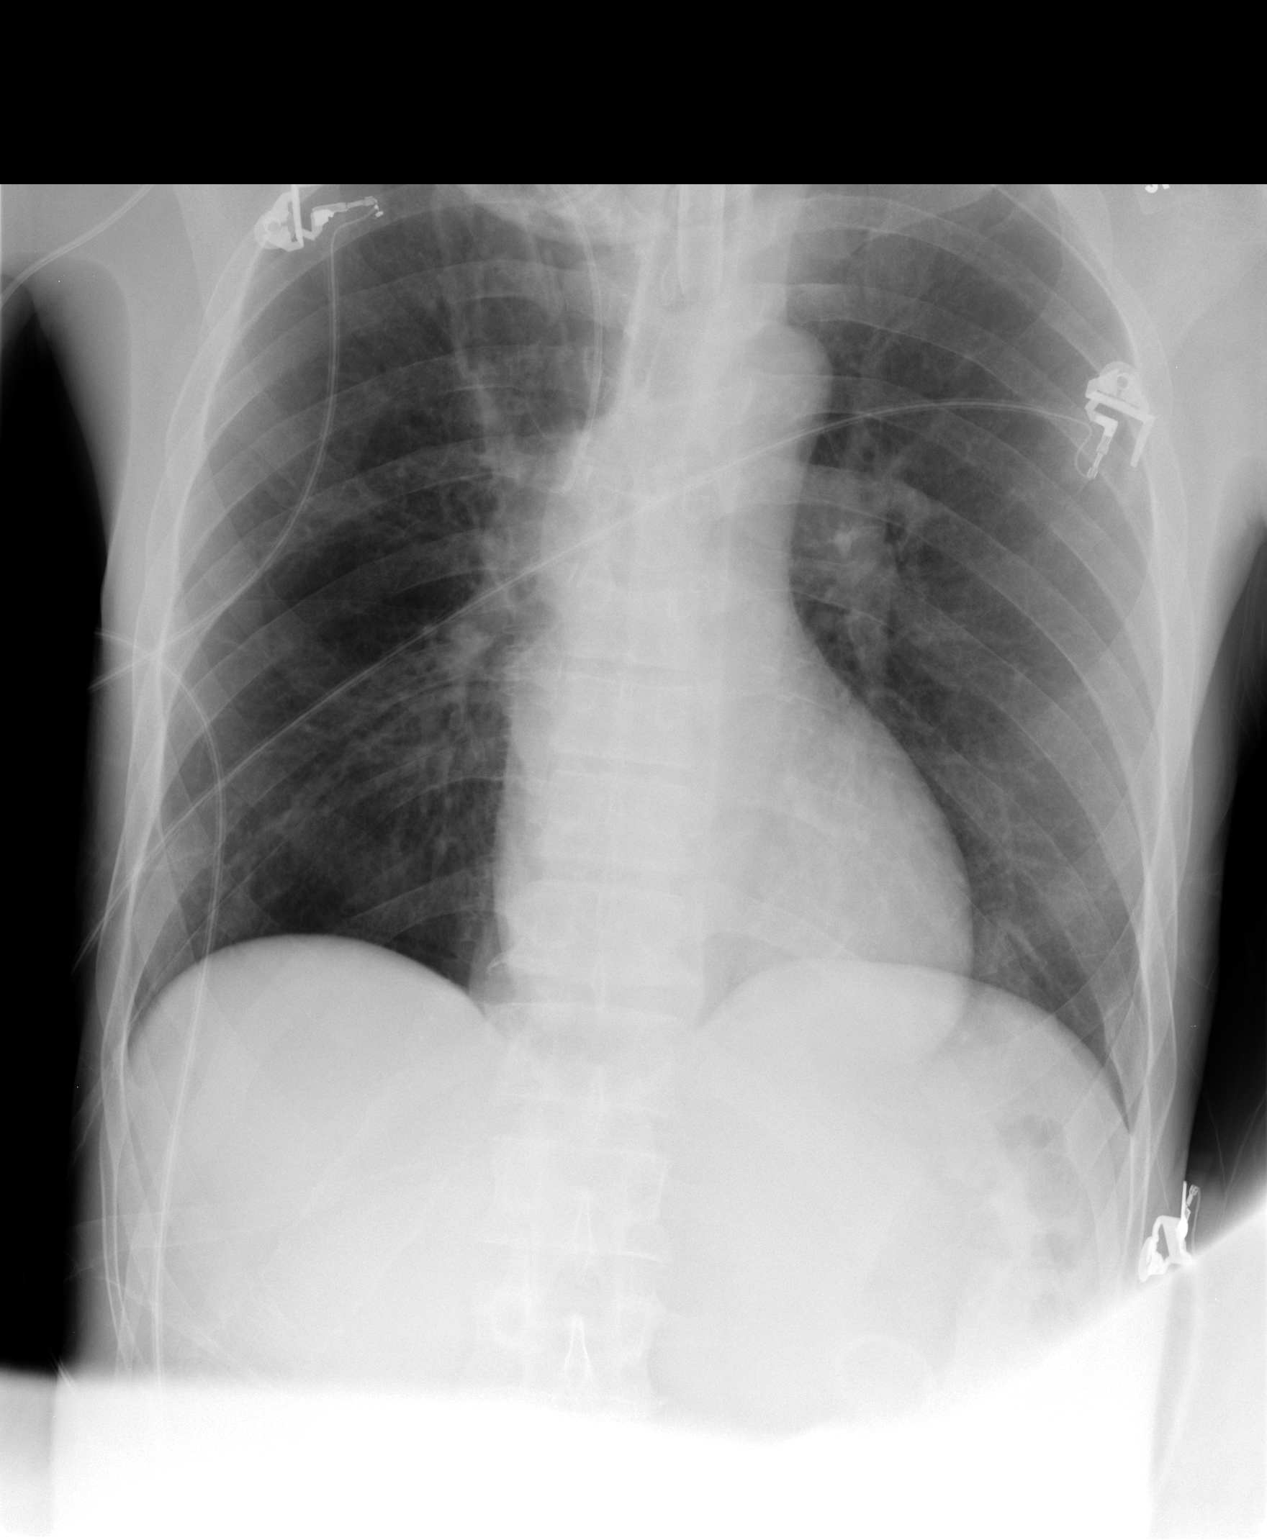

[1 of 1 positions shown; findings below may reference images not displayed]

FINDINGS: Portable exam is performed at [DATE].  Right PICC line tip overlies the level of the superior vena cava.  Patient has a tracheostomy in place.  There is minimal left base atelectasis.  No evidence for pneumothorax.
IMPRESSION: Improving aeration at the left lung base with minimal persistent atelectasis.

## 2007-06-26 IMAGING — CR DG CERVICAL SPINE 2 OR 3 VIEWS
2 series · 2 of 2 positions shown · non-contrast
Comparison: none

CLINICAL DATA: Previous posterior fusion C-3 through C-5. Evaluate alignment.
 CERVICAL SPINE - 2 VIEW:

[w c-spine lat (1 of 2)]
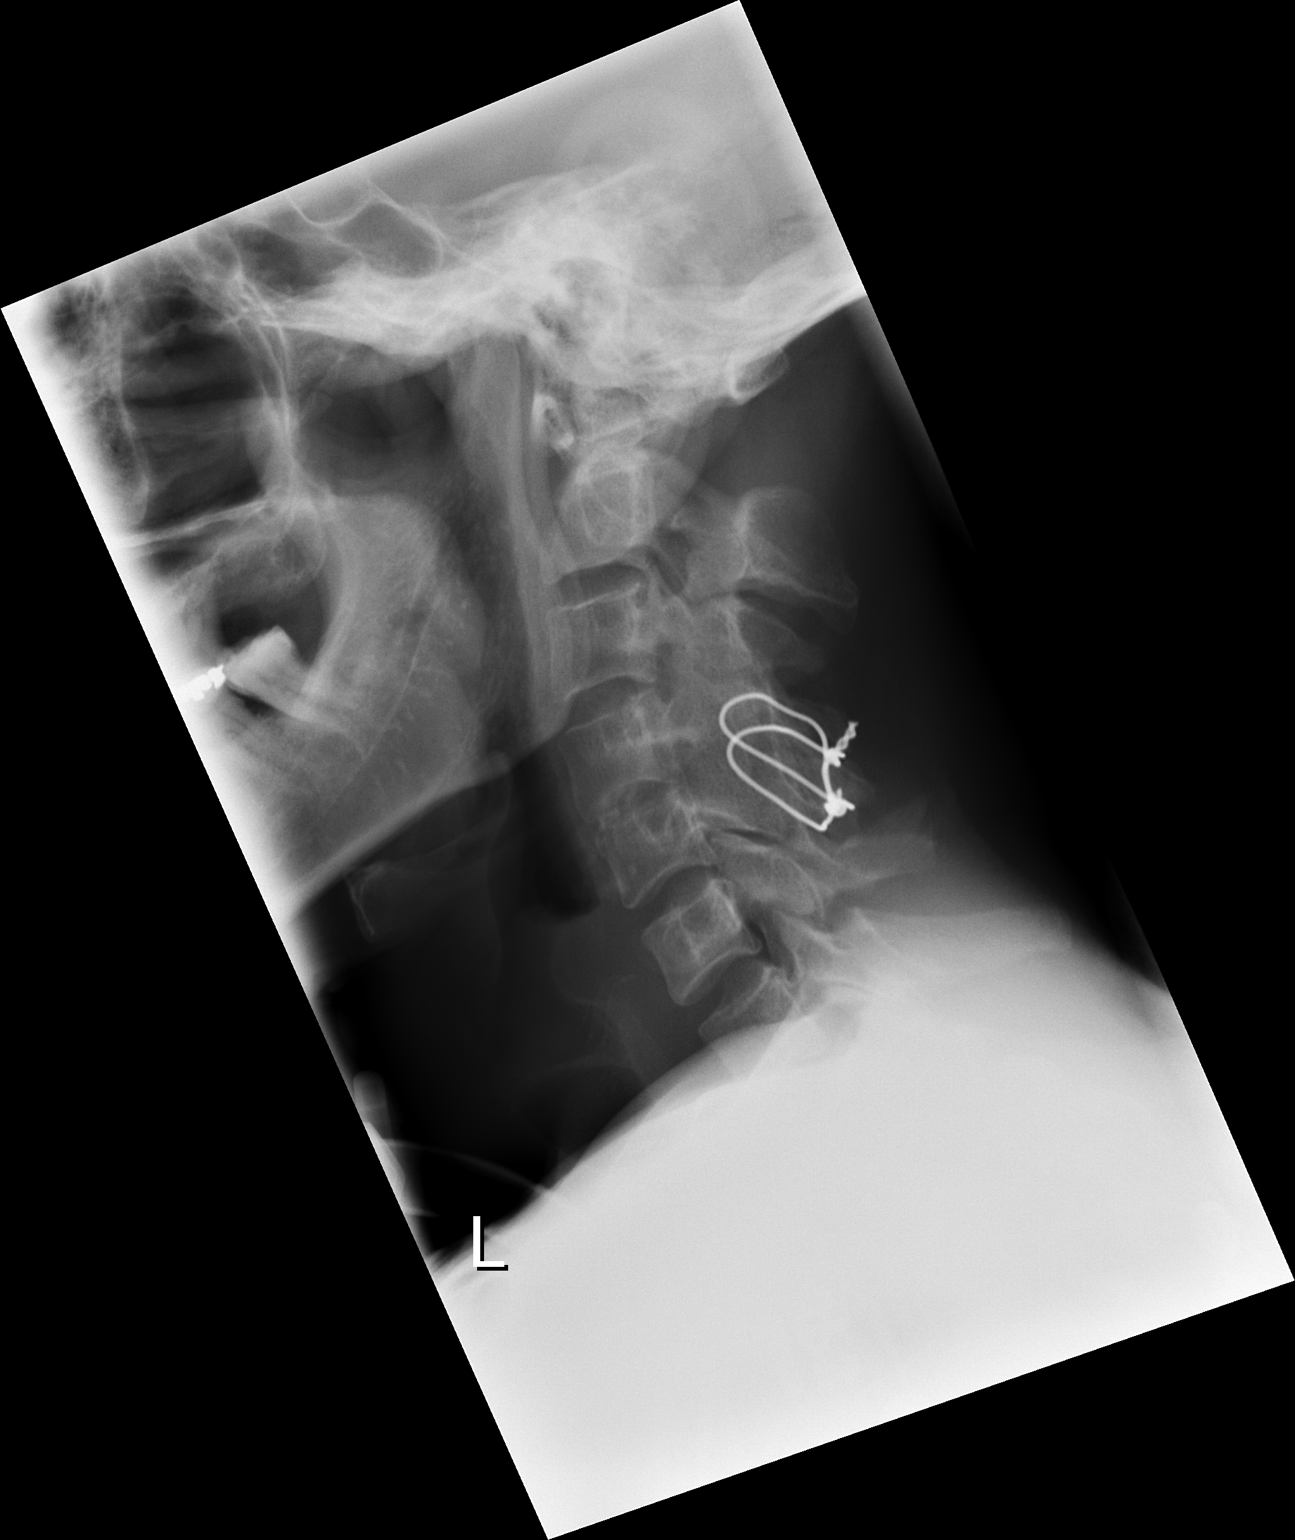

[w c-spine lat (2 of 2)]
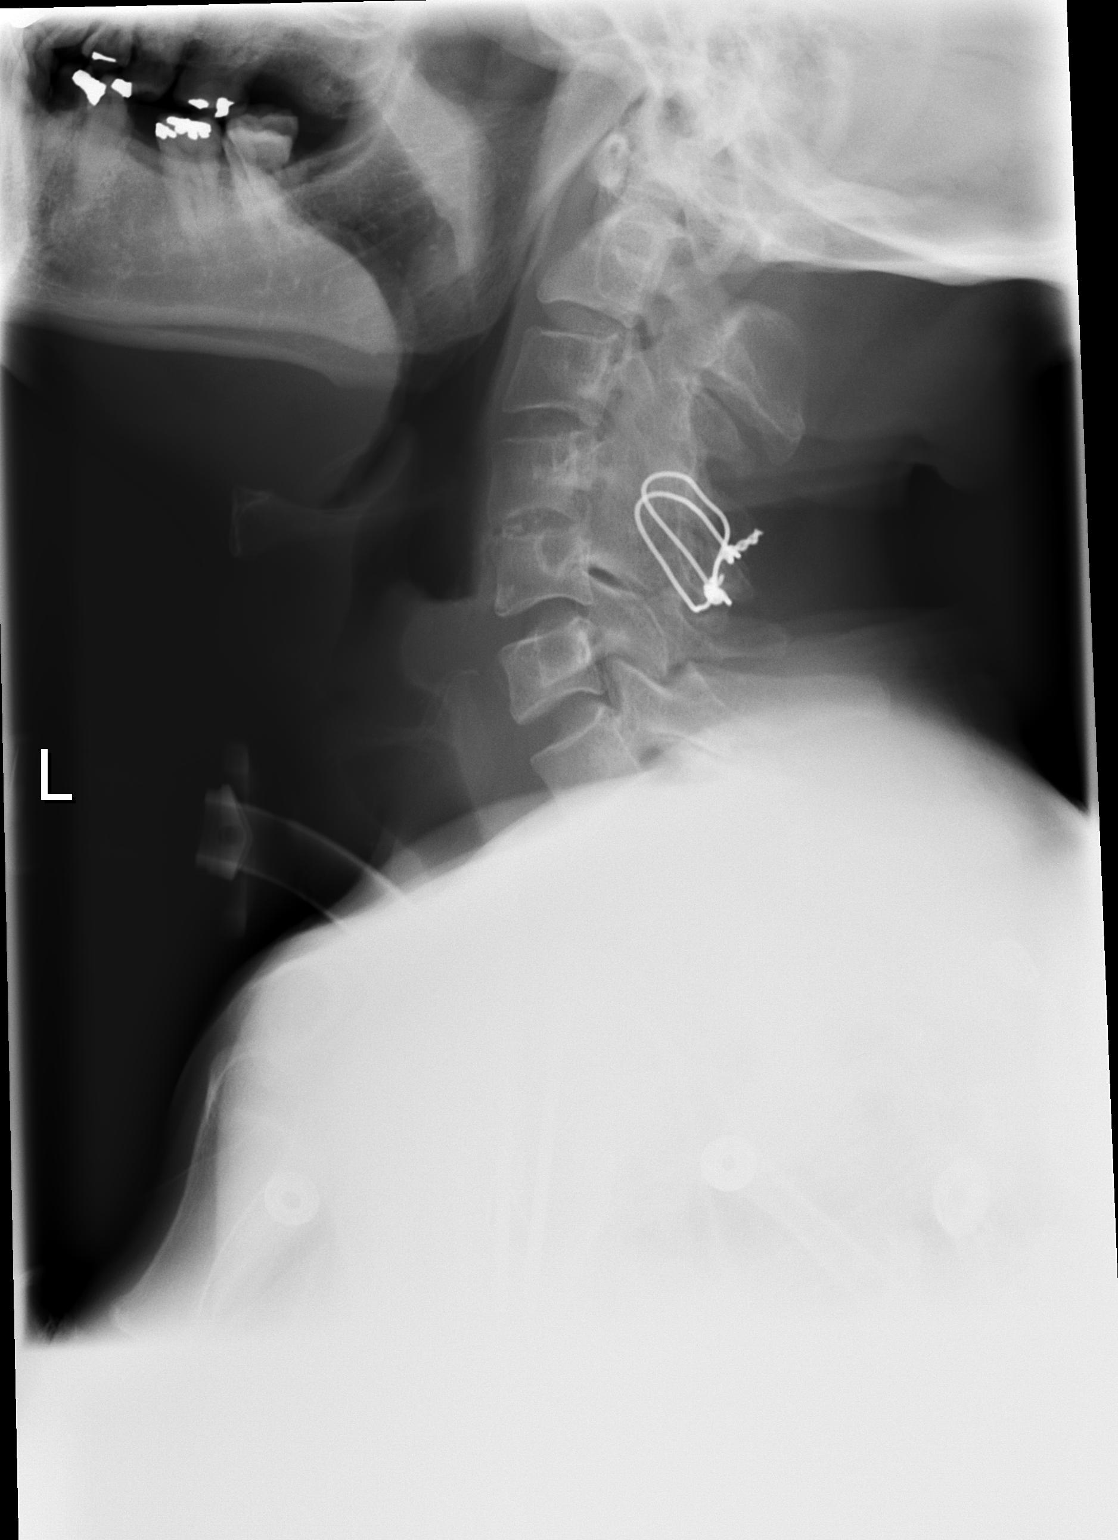

[2 of 2 positions shown; findings below may reference images not displayed]

FINDINGS: The C7-T1 relationship is not visualized on these views. There has been a previous posterior cervical fusion at C-3 through C-5 levels.  Alignment appears satisfactory and stable. There is relatively limited range of motion between flexion and extension.  There is a tracheostomy tube present.
IMPRESSION: Previous posterior cervical fusion C-3 through C-5.  C7-T1 relationship is not visualized on these views.  No evidence for subluxation or fracture.

## 2007-06-27 IMAGING — CR DG CHEST 1V PORT
1 series · 1 of 1 positions shown · non-contrast
Comparison: 06/02/06.

CLINICAL DATA: 55-year-old, patient pulled out tracheostomy tube.  Check replacement. 
PORTABLE CHEST - 1 VIEW:

[view not recorded]
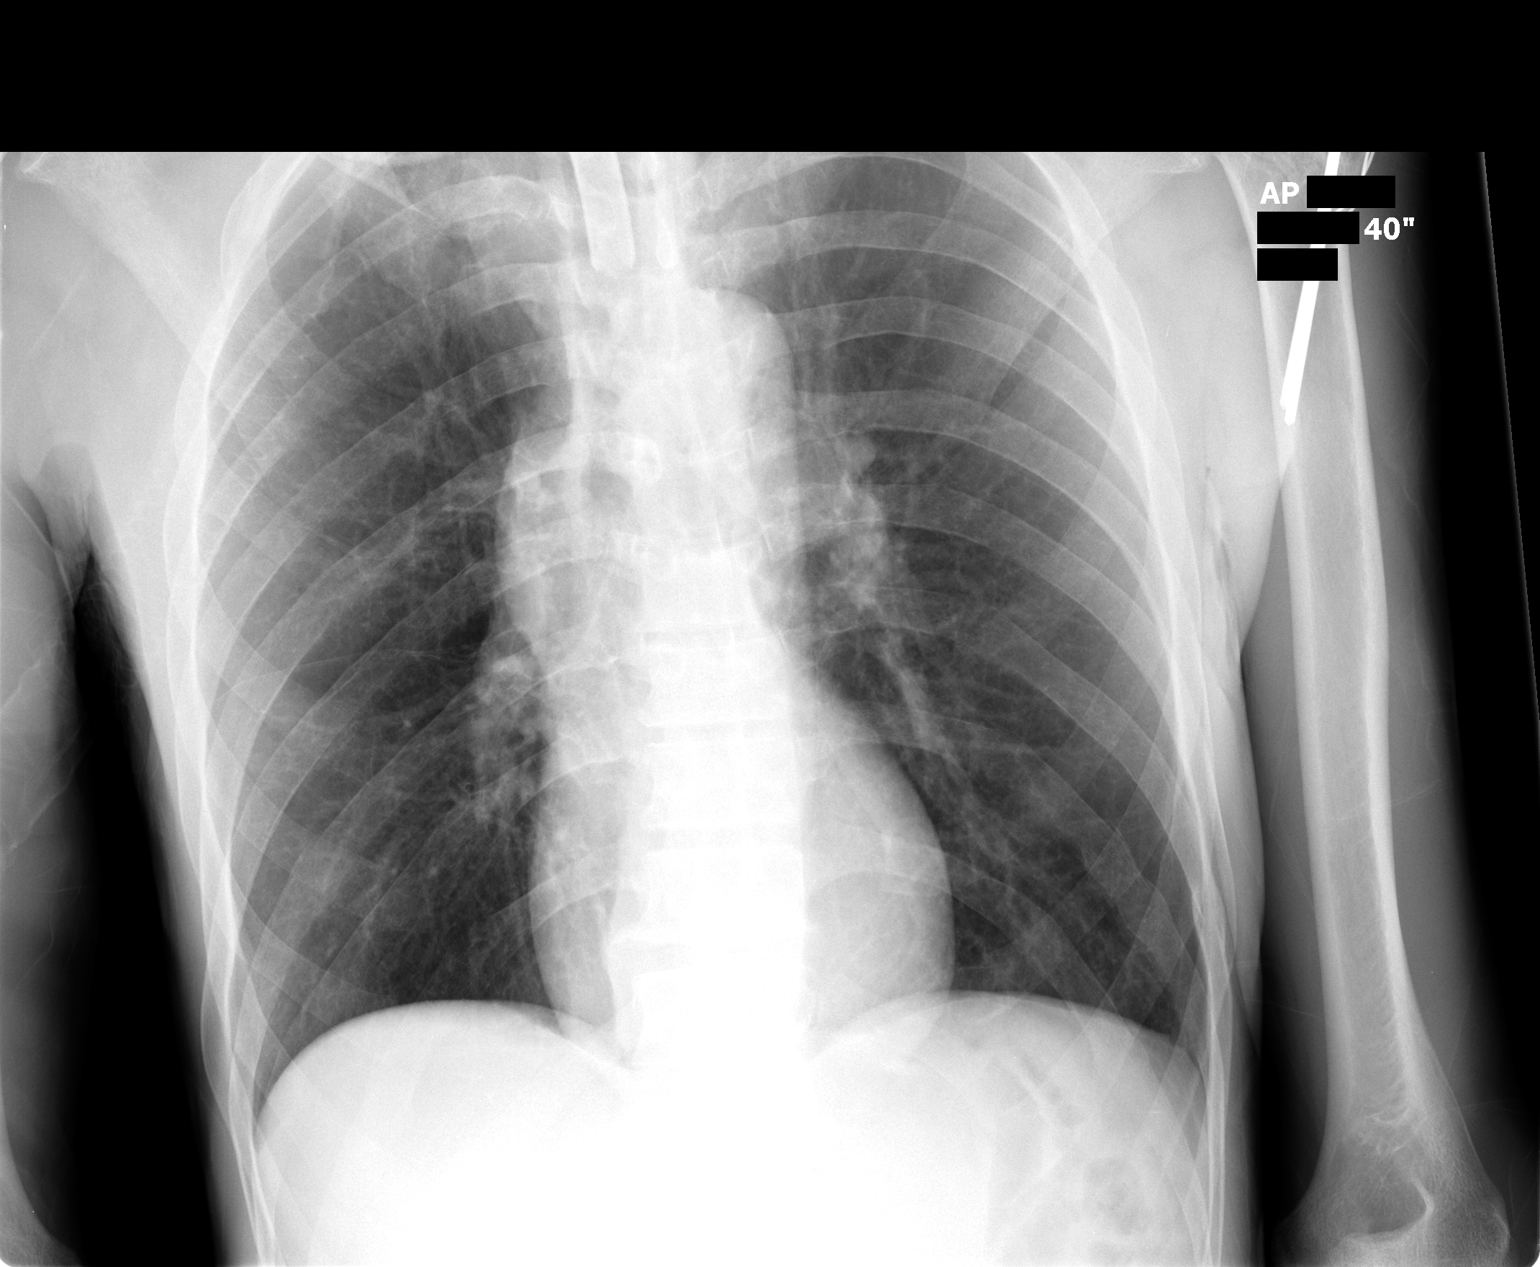

[1 of 1 positions shown; findings below may reference images not displayed]

FINDINGS: Tracheostomy tube is in good position with its tip at the mid tracheal level.  Cardiac silhouette, mediastinal, and hilar contours are within normal limits and lungs are grossly clear.  Remote post traumatic changes are noted.
IMPRESSION: Tracheostomy tube is in good position with its tip in the mid trachea.

## 2007-07-13 ENCOUNTER — Emergency Department (HOSPITAL_COMMUNITY): Admission: EM | Admit: 2007-07-13 | Discharge: 2007-07-13 | Payer: Self-pay | Admitting: Emergency Medicine

## 2007-07-31 IMAGING — CR DG FEMUR 2+V*R*
5 series · 5 of 5 positions shown · non-contrast
Comparison: 03/02/03.

CLINICAL DATA: Status post fall.
RIGHT HIP - 2 VIEW:
TECHNIQUE: Contiguous axial images were obtained from the base of the skull through the vertex according to standard protocol without contrast.

[t femur with hip  ap right]
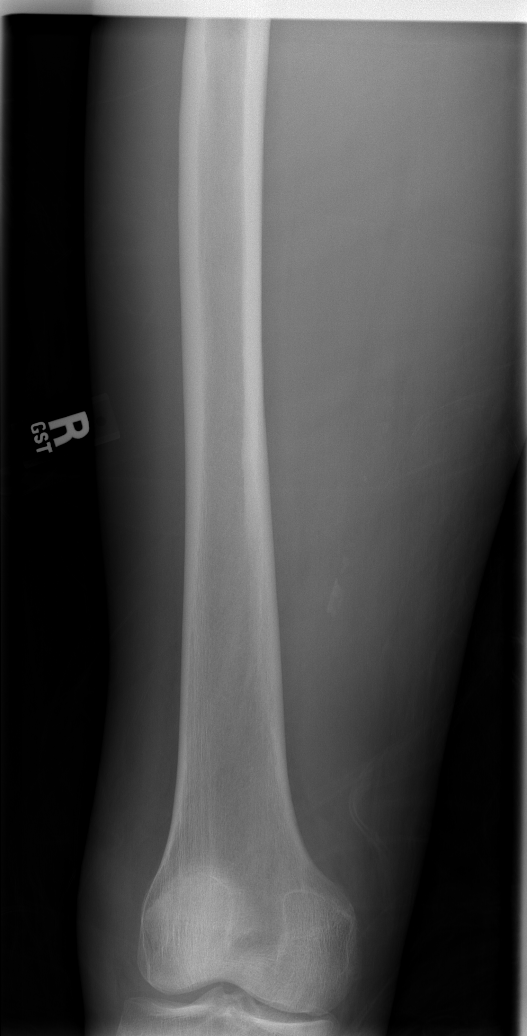

[t femur with knee ap right]
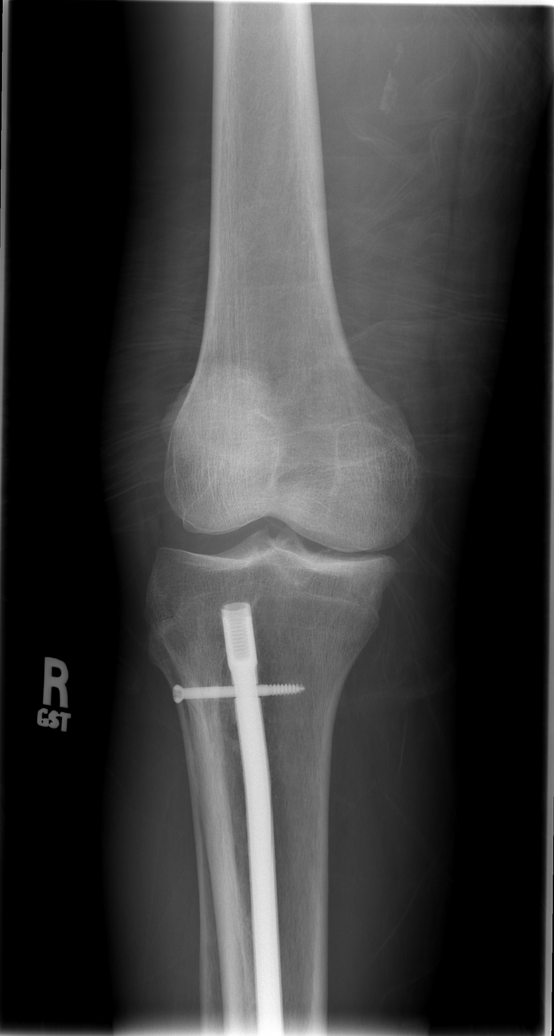

[t femur with hip lat right (1 of 2)]
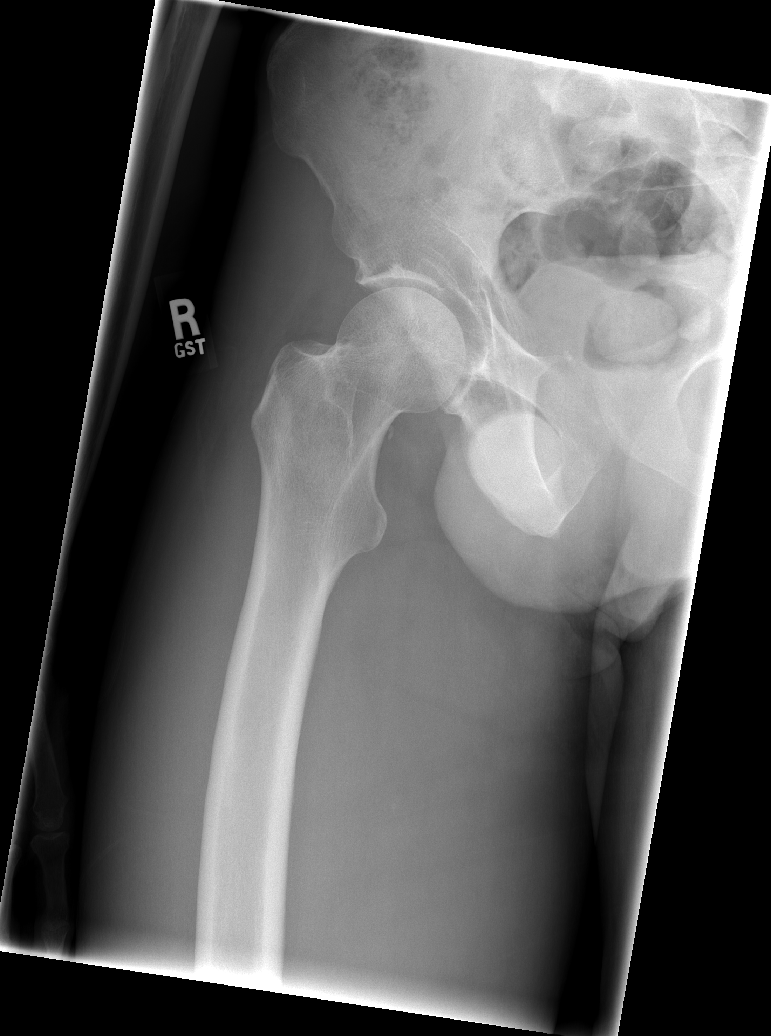

[t femur with hip lat right (2 of 2)]
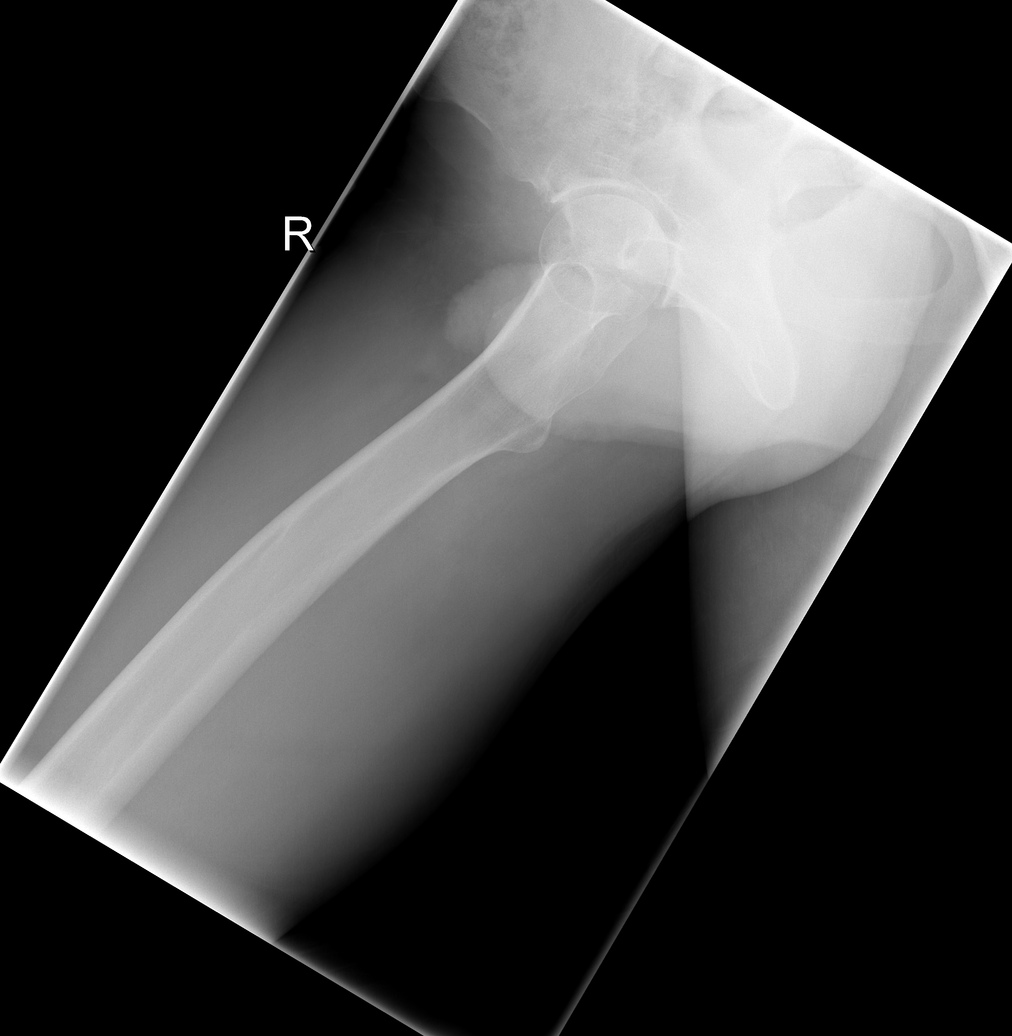

[t femur with knee lat right]
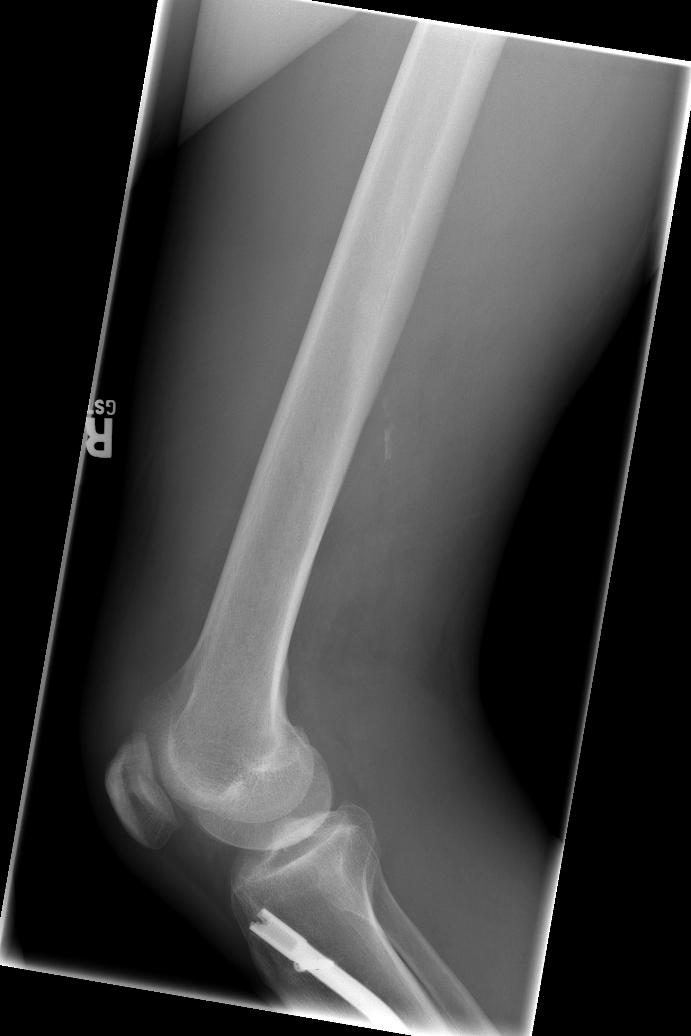

[5 of 5 positions shown; findings below may reference images not displayed]

FINDINGS: The hips are located.  The pelvis is intact.  No fracture is identified.
IMPRESSION: No acute finding. 
RIGHT FEMUR - 4 VIEW:
FINDINGS: Portion of an IM nail is seen in the tibia.  Femur is intact.  There is degenerative change about the knee with near bone on bone medial compartment joint space narrowing.
IMPRESSION: No acute finding with medial compartment degenerative change in the knee noted.  
RIGHT TIB/FIB - 2 VIEW:
FINDINGS: An IM nail with a single proximal and single distal interlocking screw are seen across a healed fracture of the midshaft of the tibia.  No hardware complication with position and alignment anatomical.  Remote healed midshaft fibular fracture noted.  There is no acute bony or joint abnormality.
IMPRESSION: No acute finding in patient with healed tibial and fibular fractures.
HEAD CT WITHOUT CONTRAST:
FINDINGS: Encephalomalacic change is seen in the right frontal lobe.  Patient has subdural hygroma bilaterally larger on the left measuring up to 0.9 cm in diameter.  Ventricular system is diffusely dilated.  There is no evidence of acute hemorrhage, infarct, mass or midline shift.  Imaged paranasal sinuses and mastoid air cells are clear with remote skull fractures on the left noted.
IMPRESSION: 1.  Subdural hygromas bilaterally larger on the left with encephalomalacic change in the right frontal lobe. 
2.  Marked dilatation of ventricular system compatible wiht hydrocephalus.

## 2007-07-31 IMAGING — CR DG TIBIA/FIBULA 2V*R*
3 series · 3 of 3 positions shown · non-contrast
Comparison: 03/02/03.

CLINICAL DATA: Status post fall.
RIGHT HIP - 2 VIEW:
TECHNIQUE: Contiguous axial images were obtained from the base of the skull through the vertex according to standard protocol without contrast.

[t tib/fib ap right]
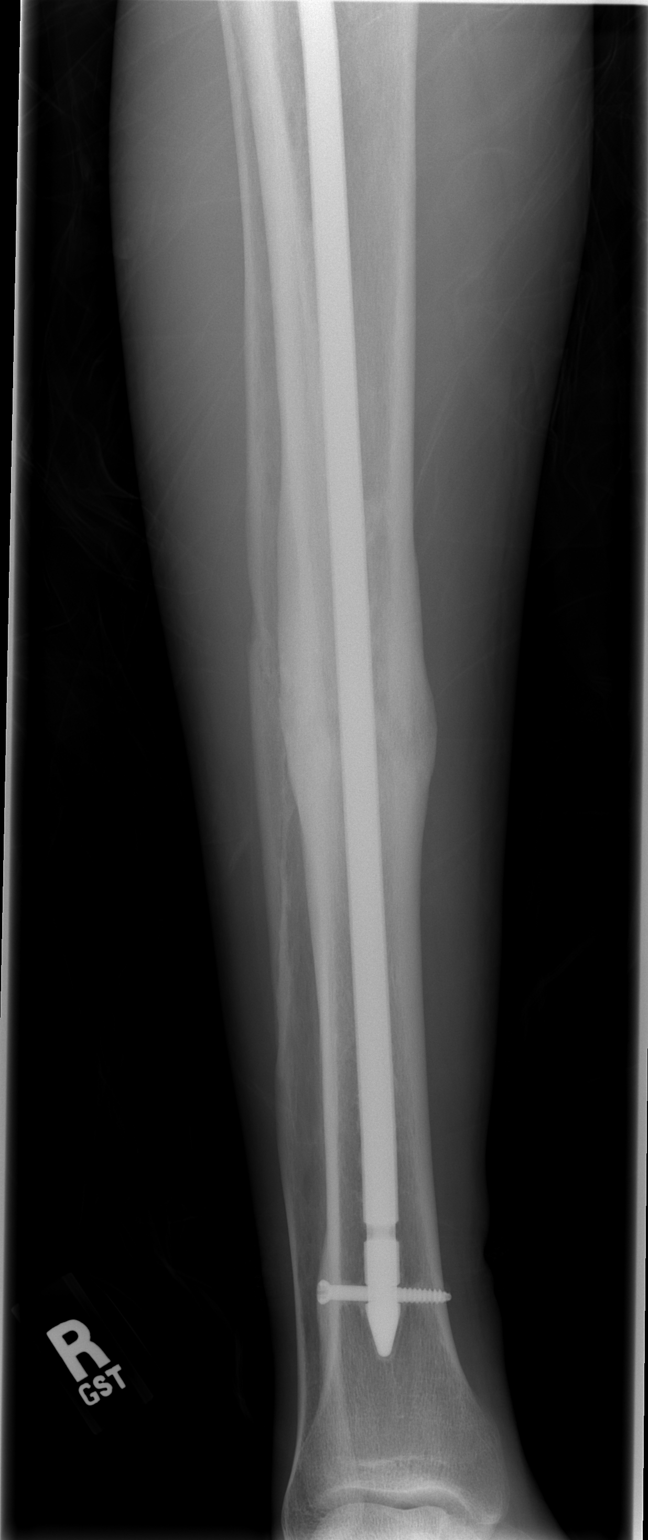

[t tib/fib lat right (1 of 2)]
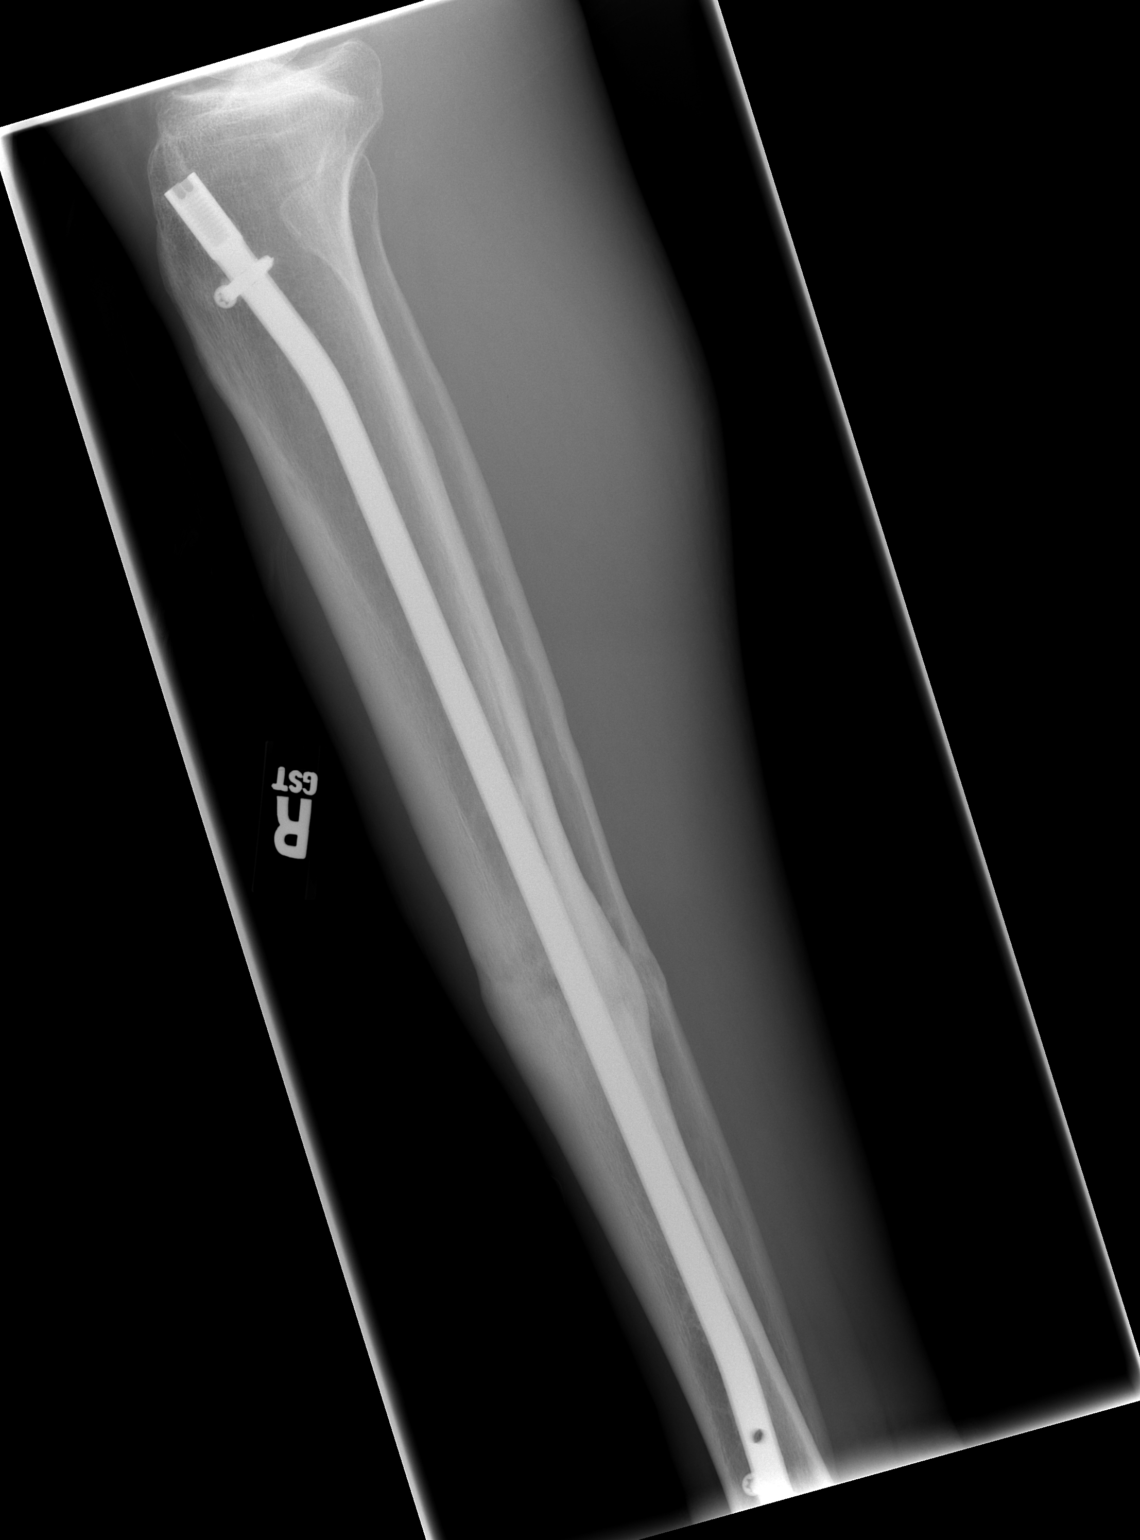

[t tib/fib lat right (2 of 2)]
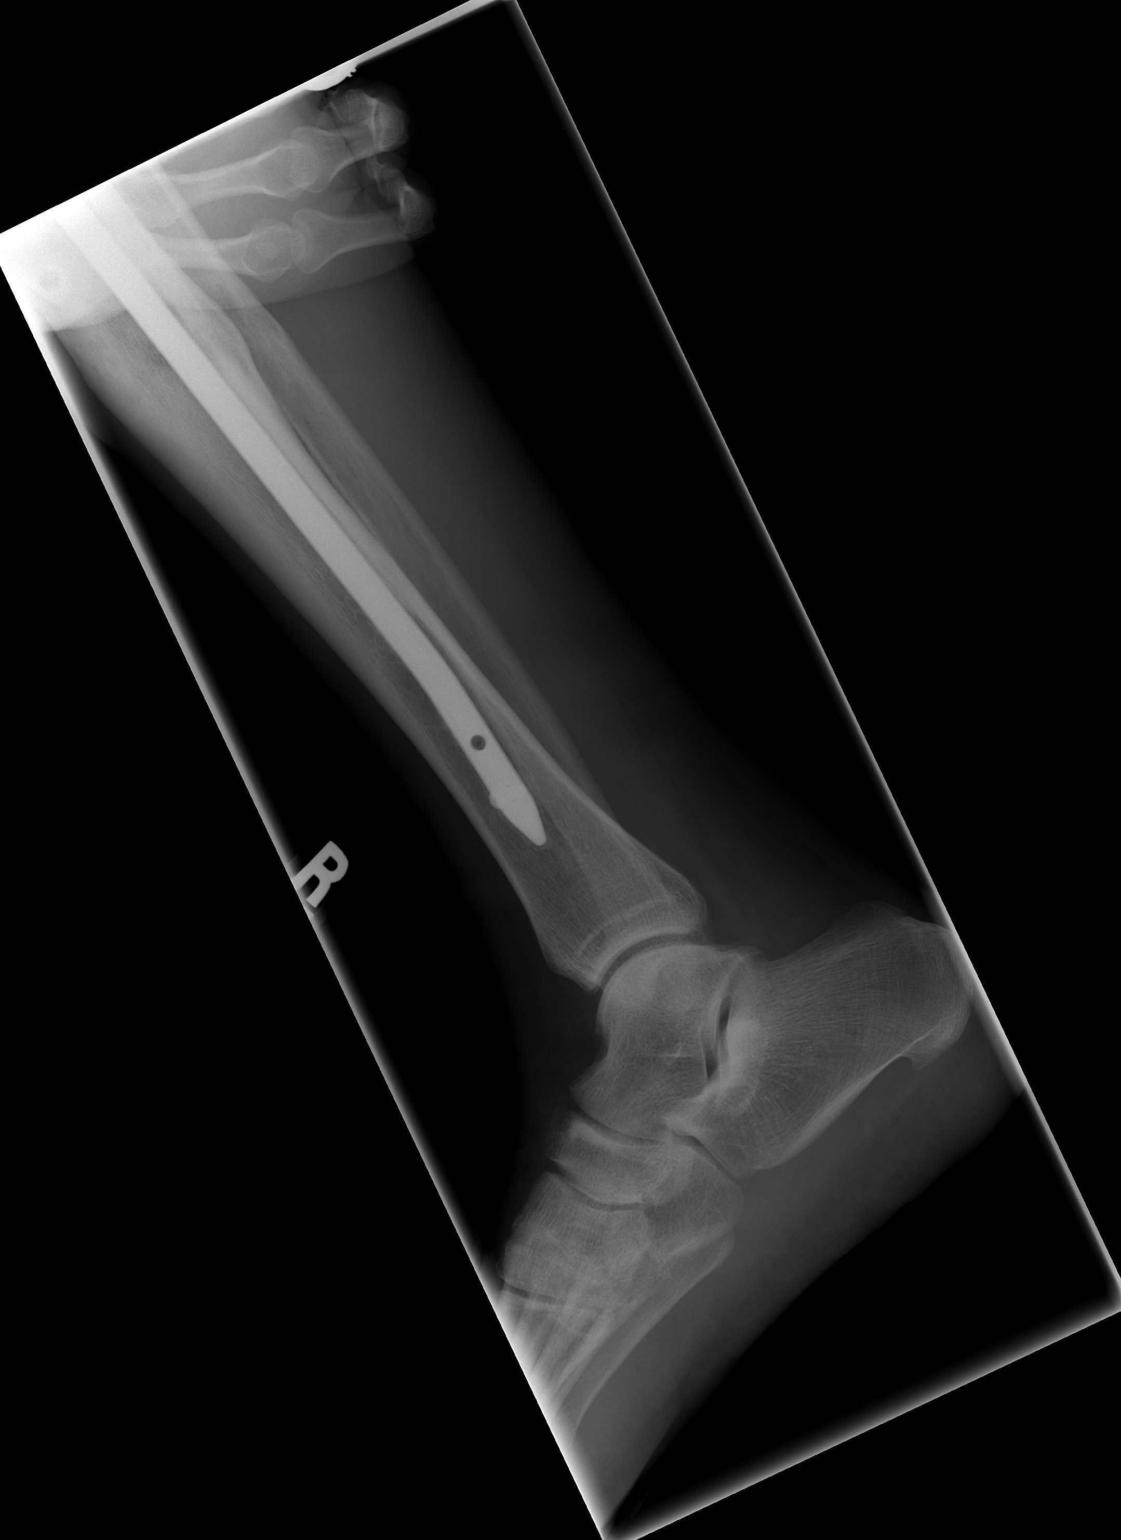

[3 of 3 positions shown; findings below may reference images not displayed]

FINDINGS: The hips are located.  The pelvis is intact.  No fracture is identified.
IMPRESSION: No acute finding. 
RIGHT FEMUR - 4 VIEW:
FINDINGS: Portion of an IM nail is seen in the tibia.  Femur is intact.  There is degenerative change about the knee with near bone on bone medial compartment joint space narrowing.
IMPRESSION: No acute finding with medial compartment degenerative change in the knee noted.  
RIGHT TIB/FIB - 2 VIEW:
FINDINGS: An IM nail with a single proximal and single distal interlocking screw are seen across a healed fracture of the midshaft of the tibia.  No hardware complication with position and alignment anatomical.  Remote healed midshaft fibular fracture noted.  There is no acute bony or joint abnormality.
IMPRESSION: No acute finding in patient with healed tibial and fibular fractures.
HEAD CT WITHOUT CONTRAST:
FINDINGS: Encephalomalacic change is seen in the right frontal lobe.  Patient has subdural hygroma bilaterally larger on the left measuring up to 0.9 cm in diameter.  Ventricular system is diffusely dilated.  There is no evidence of acute hemorrhage, infarct, mass or midline shift.  Imaged paranasal sinuses and mastoid air cells are clear with remote skull fractures on the left noted.
IMPRESSION: 1.  Subdural hygromas bilaterally larger on the left with encephalomalacic change in the right frontal lobe. 
2.  Marked dilatation of ventricular system compatible wiht hydrocephalus.

## 2007-07-31 IMAGING — CT CT ANGIO CHEST
3 of 6 series · 15 of 46 positions shown · IV contrast (APPLIED)
Comparison: none

CLINICAL DATA: Right leg swelling.  Question DVT and pulmonary embolus. 
CT ANGIOGRAPHY OF CHEST (PULMONARY EMBOLUS PROTOCOL):
TECHNIQUE: Multidetector CT imaging of the chest was performed during bolus injection of intravenous contrast.  Multiplanar CT angiographic image reconstructions were generated to evaluate the vascular anatomy.
Contrast:  75 cc Omnipaque 300.
TECHNIQUE: Delayed CT images of the lower extremities were obtained after intravenous contrast administration from the level of the popliteal veins through the lower IVC, according to the protocol for evaluation for deep venous thrombosis.

[Series 5: dvt/legs 5.0 b31s · axial · 0.76mm/px · z∈[-1244,-614]mm · 4 of 37 slices shown]
[im 8/37  lung]
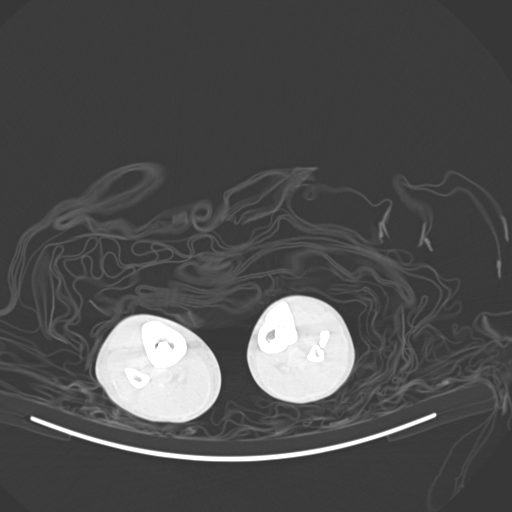
[im 15/37  soft-tissue]
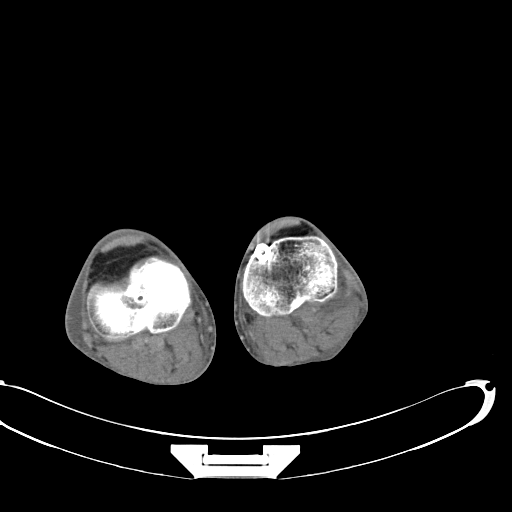
[im 22/37  lung]
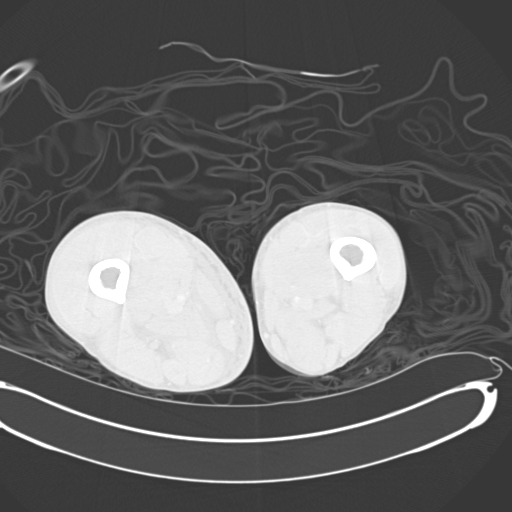
[im 29/37  soft-tissue]
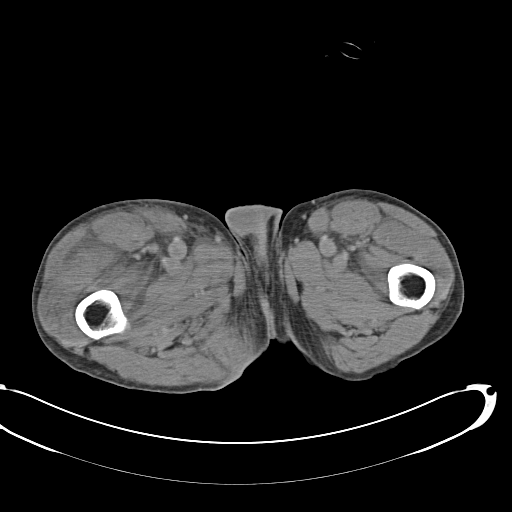

[Series 6: pulm embolism 2.0 st · axial · 0.63mm/px · z∈[-272,+2]mm · 8 of 163 slices shown]
[im 13/163  lung]
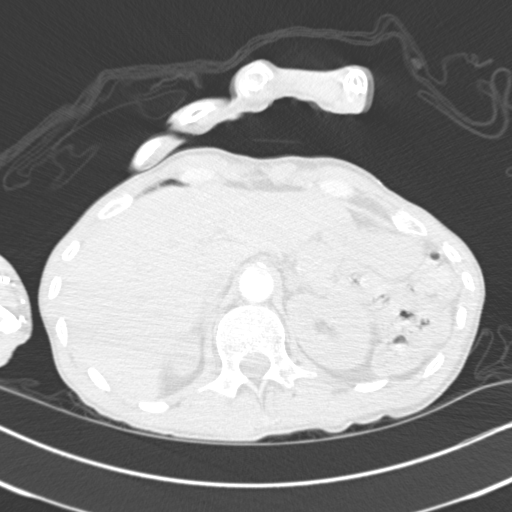
[im 33/163  lung]
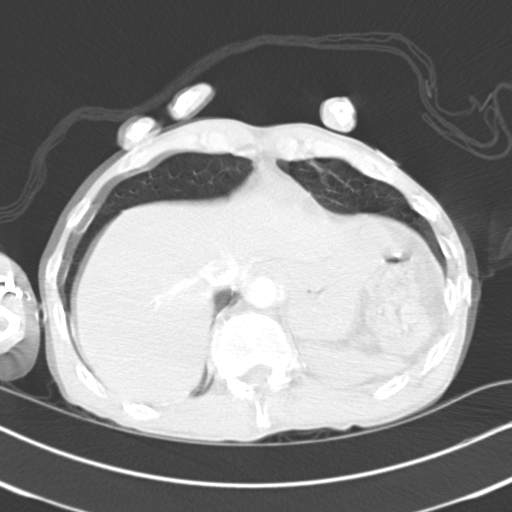
[im 52/163  lung]
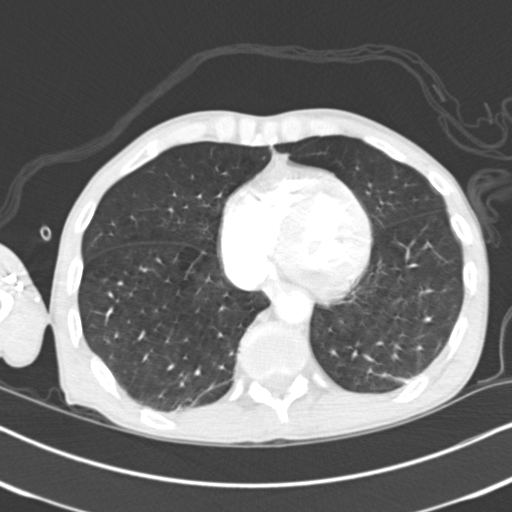
[im 72/163  lung]
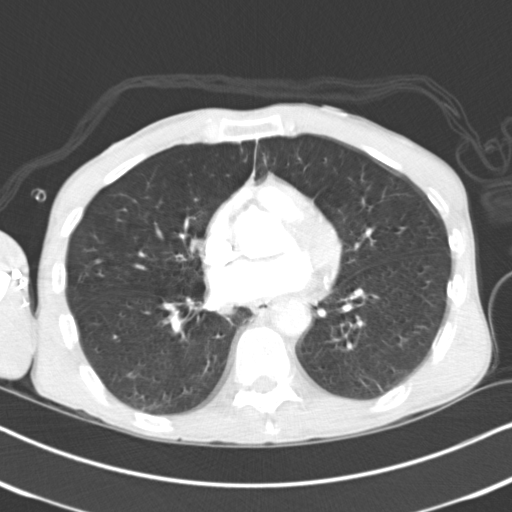
[im 91/163  lung]
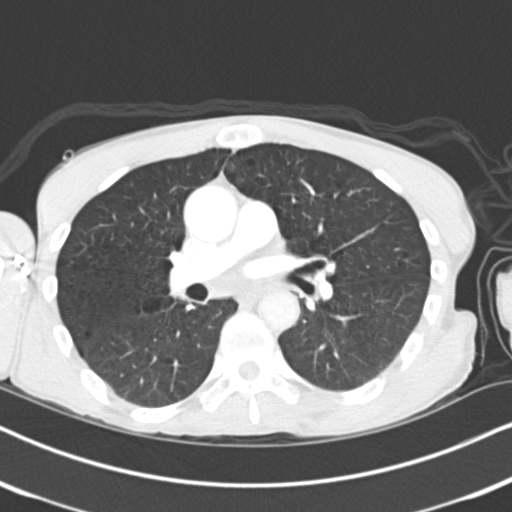
[im 111/163  lung]
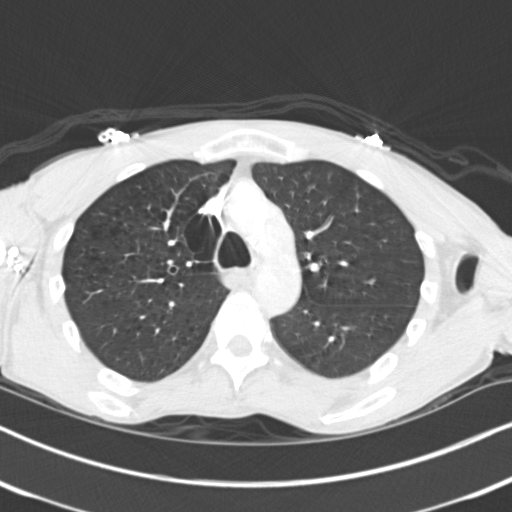
[im 130/163  lung]
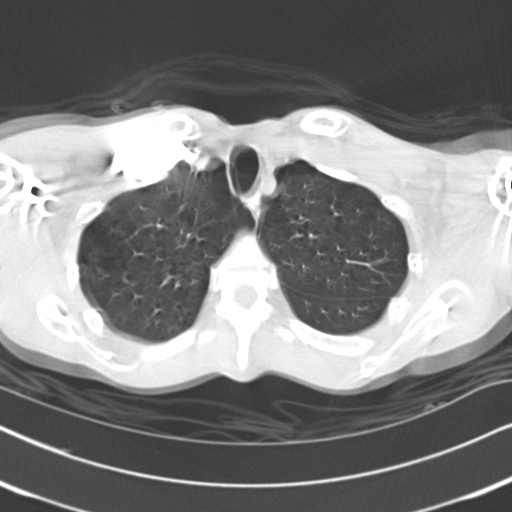
[im 150/163  lung]
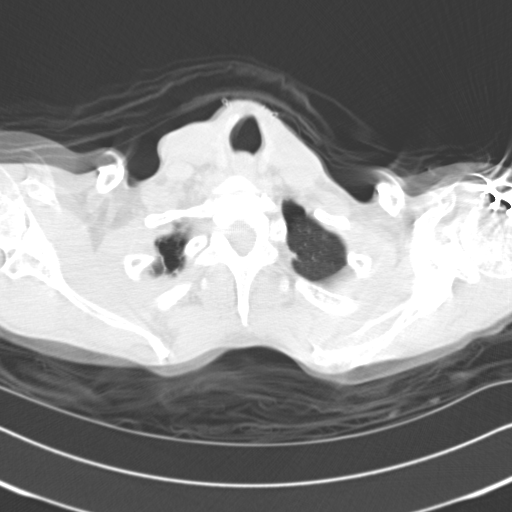

[Series 602: cor · coronal · 0.64mm/px · 3 of 96 slices shown]
[im 24/96  soft-tissue]
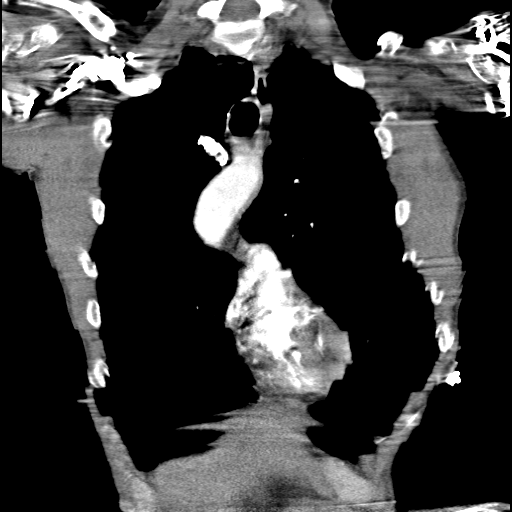
[im 48/96  soft-tissue]
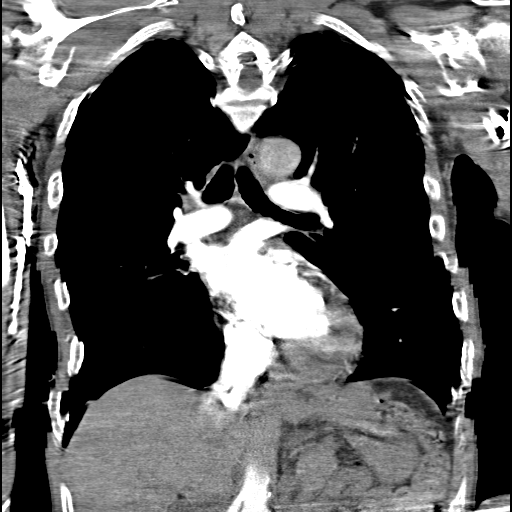
[im 72/96  soft-tissue]
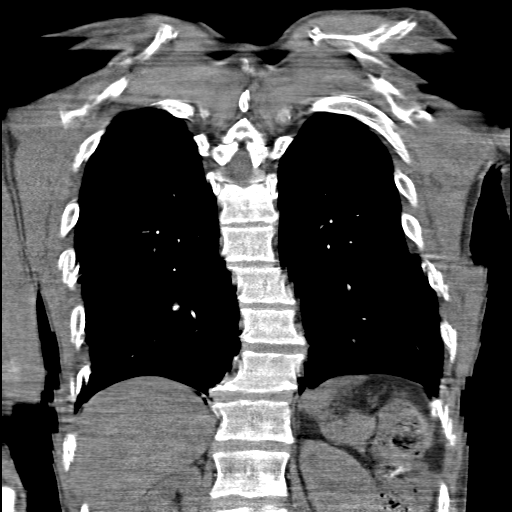

[15 of 46 positions shown; findings below may reference images not displayed]

FINDINGS: The study is limited by patient motion.  There is a tiny filling defect in the periphery of the descending right interlobar pulmonary artery which may represent small chronic pulmonary embolus.  No other evidence of pulmonary embolus is identified.  There is no hilar, mediastinal, or axillary lymphadenopathy.  No pleural or pericardial effusion.  The patient has marked and diffuse emphysema.  The heart size is normal.  Incidentally imaged upper abdomen is unremarkable.  No focal bony abnormality.
IMPRESSION: 1.  Questioned tiny and likely chronic pulmonary embolus in the descending right intralobar pulmonary artery. 
2.  Marked emphysema. 
CT OF THE LOWER EXTREMITIES WITH CONTRAST - DVT PROTOCOL:
FINDINGS: IVC filter is noted.  The soft tissues of the right lower extremity demonstrate diffuse swelling.  Although evaluation is somewhat limited, there is evidence of filling defect within the popliteal vein and within the superficial femoral vein.  The patient has an IM nail in the tibia limiting its evaluation.
IMPRESSION: Findings highly suspicious for deep venous thrombosis in the superficial femoral and popliteal veins.  This can be confirmed with ultrasound.

## 2007-07-31 IMAGING — CR DG HIP COMPLETE 2+V*R*
2 series · 2 of 2 positions shown · non-contrast
Comparison: 03/02/03.

CLINICAL DATA: Status post fall.
RIGHT HIP - 2 VIEW:
TECHNIQUE: Contiguous axial images were obtained from the base of the skull through the vertex according to standard protocol without contrast.

[t pelvis a.p.]
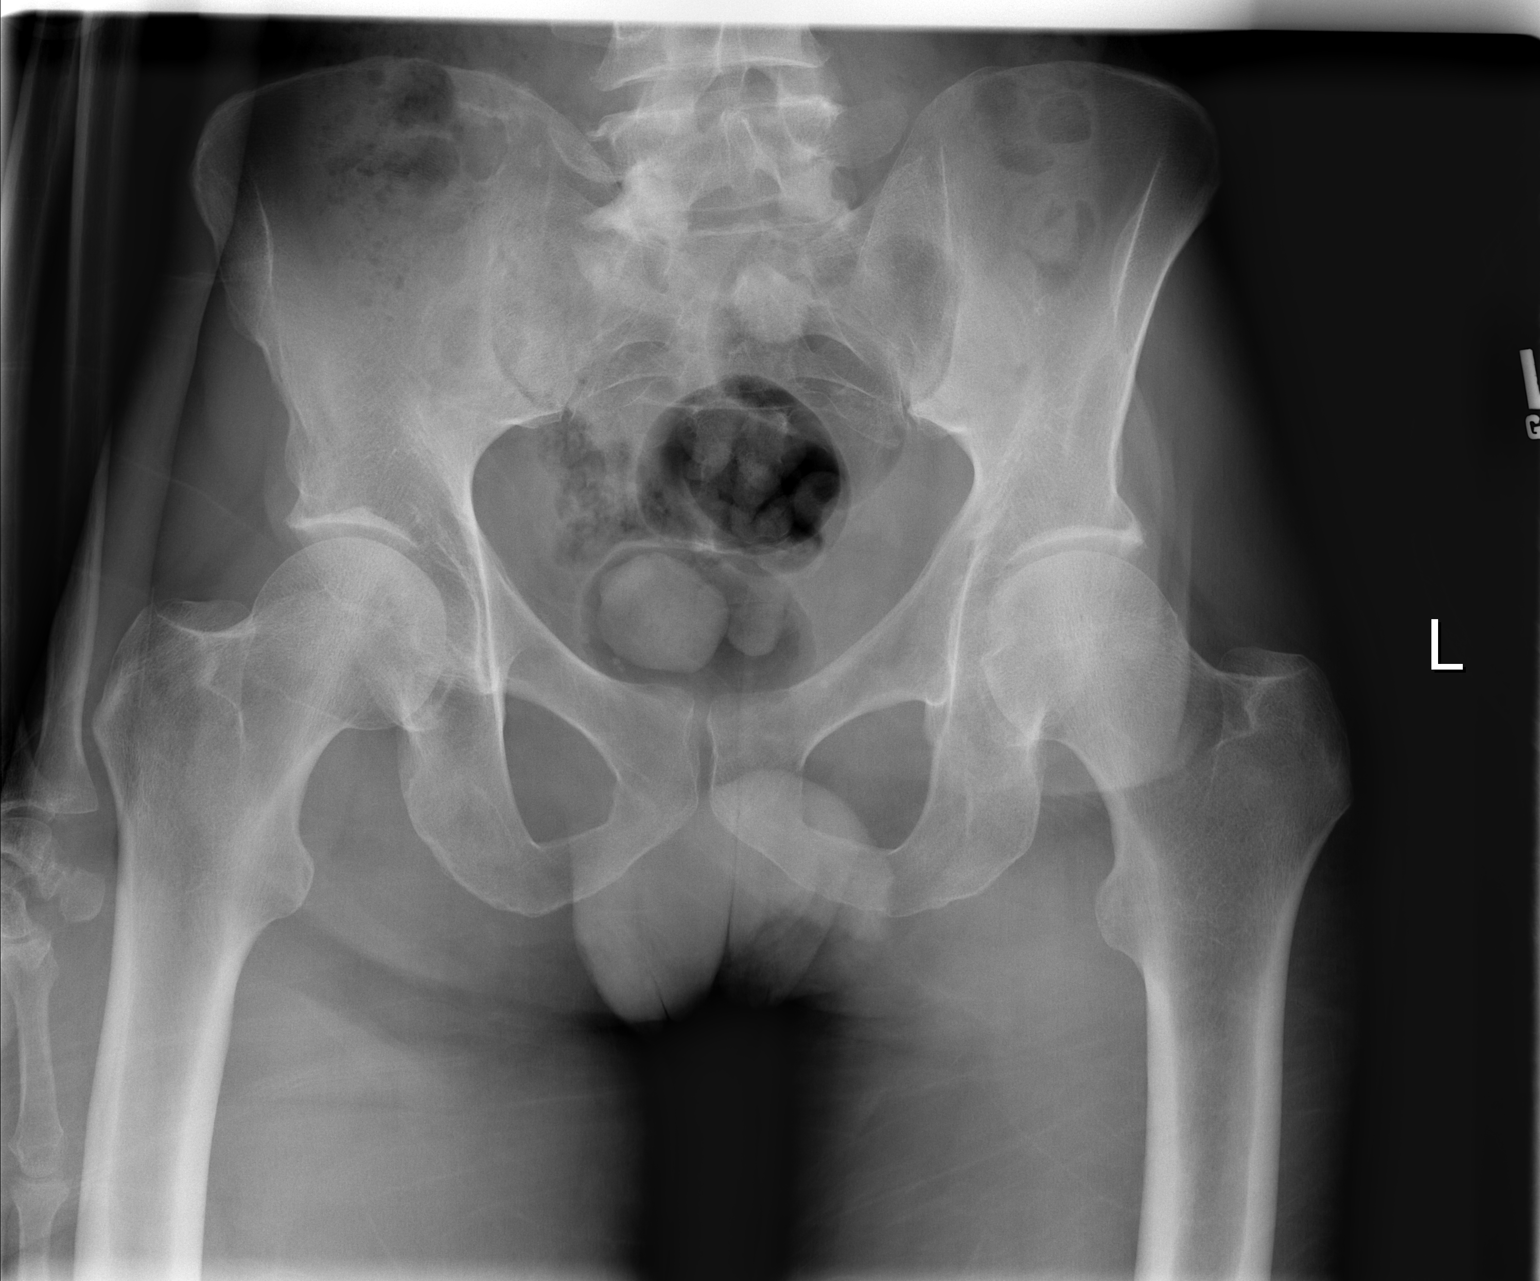

[t hip ap right]
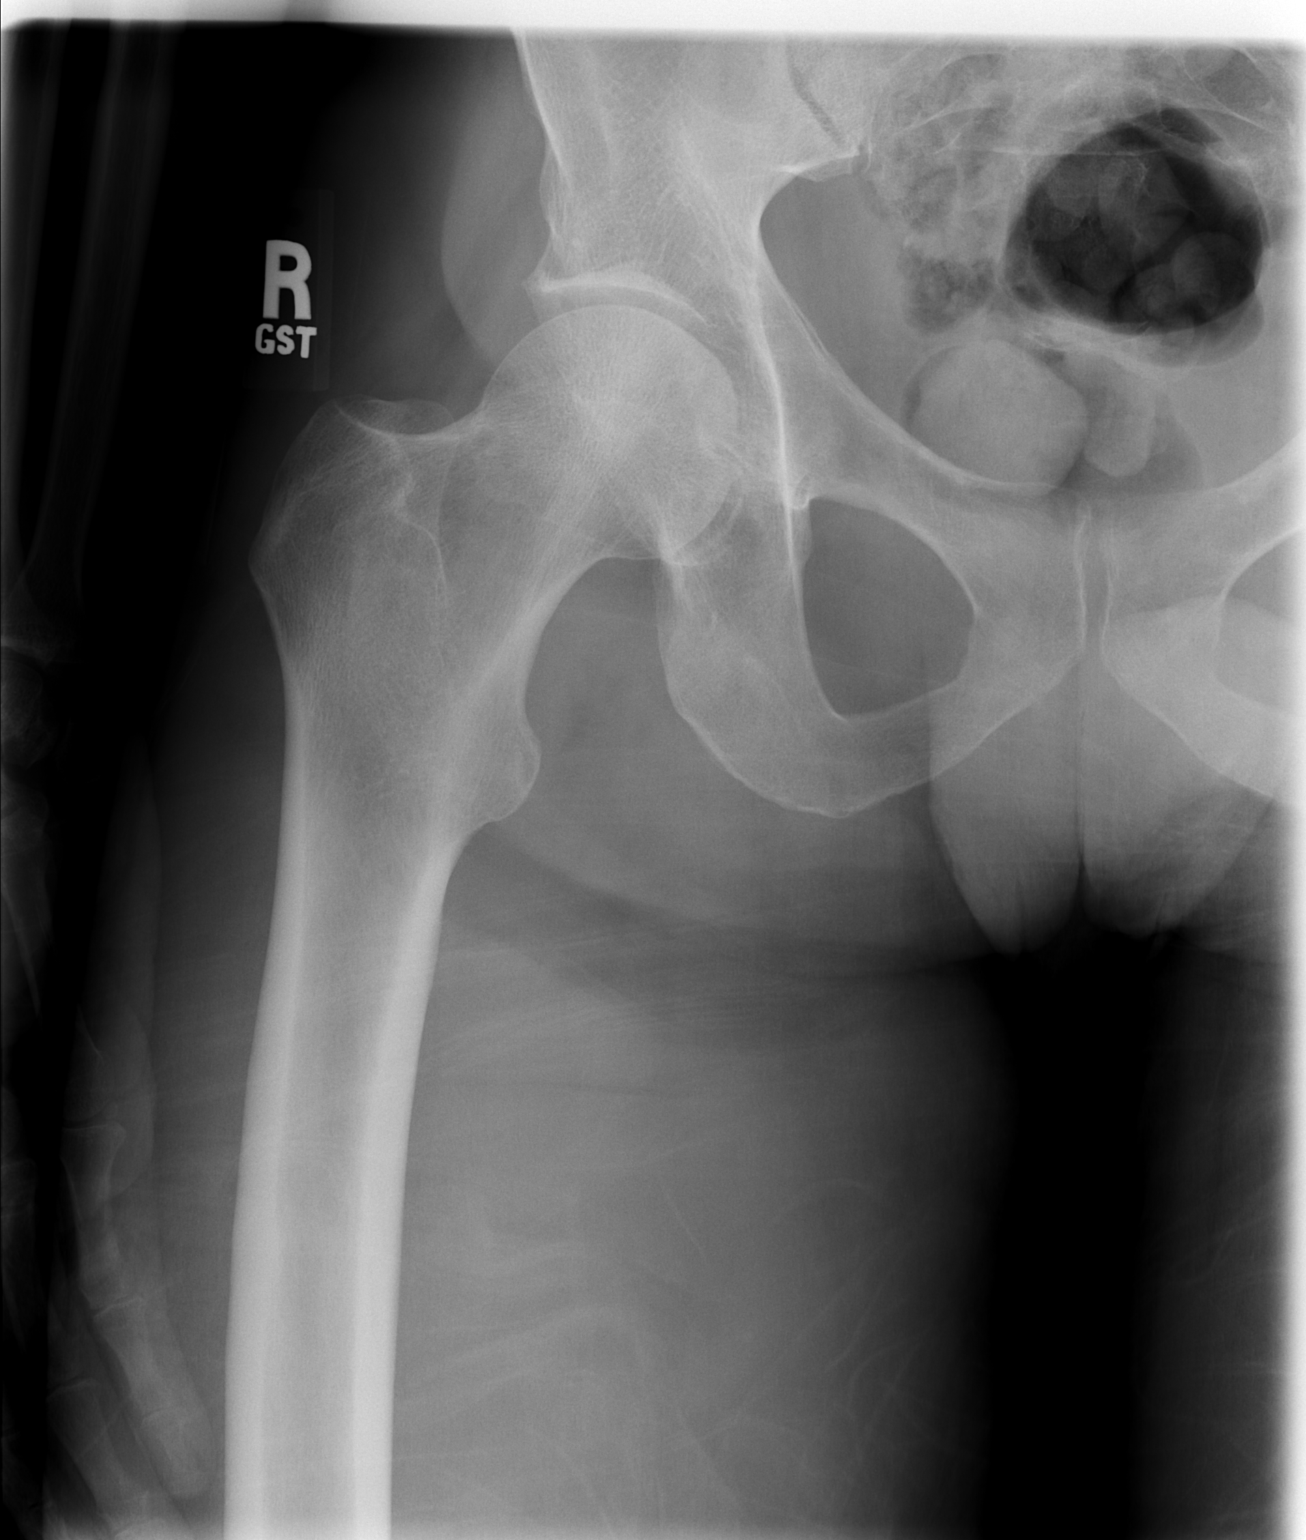

[2 of 2 positions shown; findings below may reference images not displayed]

FINDINGS: The hips are located.  The pelvis is intact.  No fracture is identified.
IMPRESSION: No acute finding. 
RIGHT FEMUR - 4 VIEW:
FINDINGS: Portion of an IM nail is seen in the tibia.  Femur is intact.  There is degenerative change about the knee with near bone on bone medial compartment joint space narrowing.
IMPRESSION: No acute finding with medial compartment degenerative change in the knee noted.  
RIGHT TIB/FIB - 2 VIEW:
FINDINGS: An IM nail with a single proximal and single distal interlocking screw are seen across a healed fracture of the midshaft of the tibia.  No hardware complication with position and alignment anatomical.  Remote healed midshaft fibular fracture noted.  There is no acute bony or joint abnormality.
IMPRESSION: No acute finding in patient with healed tibial and fibular fractures.
HEAD CT WITHOUT CONTRAST:
FINDINGS: Encephalomalacic change is seen in the right frontal lobe.  Patient has subdural hygroma bilaterally larger on the left measuring up to 0.9 cm in diameter.  Ventricular system is diffusely dilated.  There is no evidence of acute hemorrhage, infarct, mass or midline shift.  Imaged paranasal sinuses and mastoid air cells are clear with remote skull fractures on the left noted.
IMPRESSION: 1.  Subdural hygromas bilaterally larger on the left with encephalomalacic change in the right frontal lobe. 
2.  Marked dilatation of ventricular system compatible wiht hydrocephalus.

## 2007-07-31 IMAGING — CT CT HEAD W/O CM
1 of 2 series · 13 of 30 positions shown, 17 images · non-contrast
Comparison: 03/02/03.

CLINICAL DATA: Status post fall.
RIGHT HIP - 2 VIEW:
TECHNIQUE: Contiguous axial images were obtained from the base of the skull through the vertex according to standard protocol without contrast.

[Series 2: brain · axial · 0.47mm/px · z∈[+81,+206]mm · 13 of 28 slices shown, 17 images]
[im 2/28  brain]
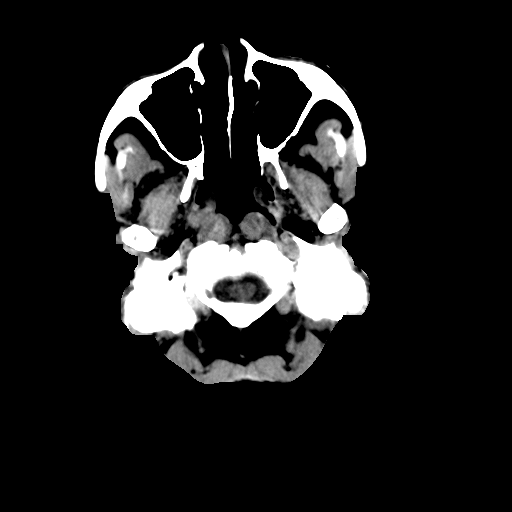
[im 2/28  bone]
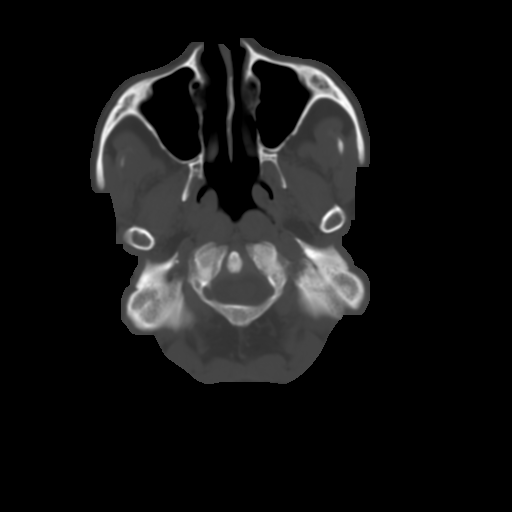
[im 4/28  brain]
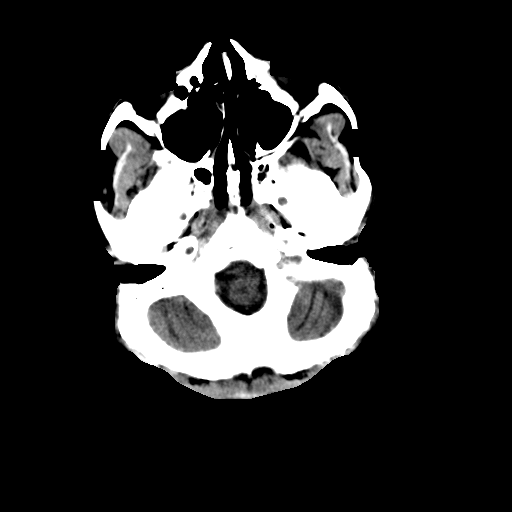
[im 6/28  brain]
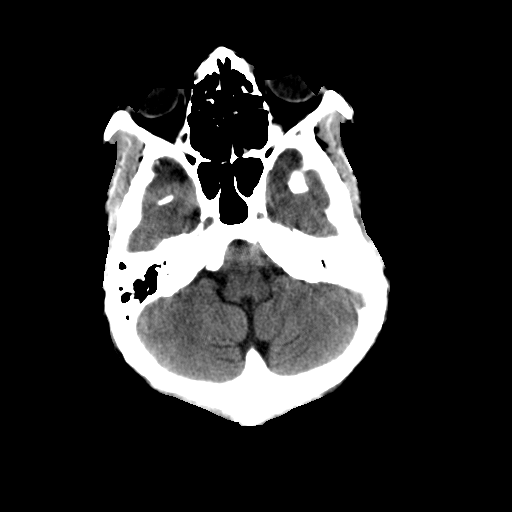
[im 8/28  brain]
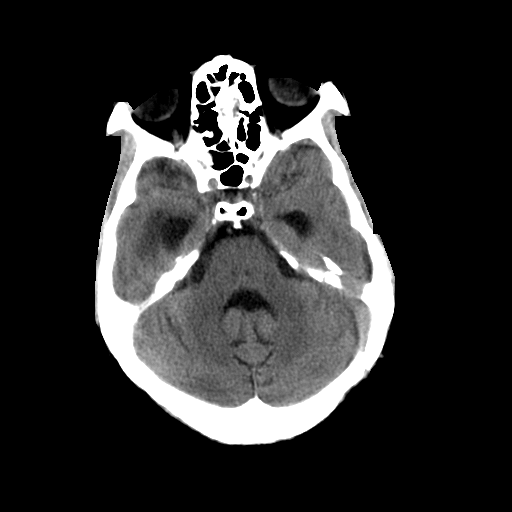
[im 10/28  brain]
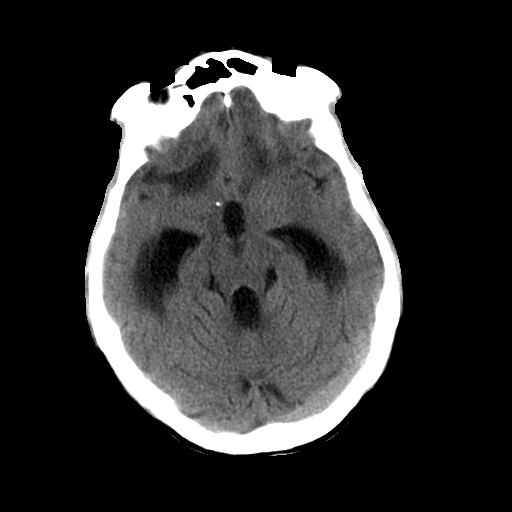
[im 10/28  bone]
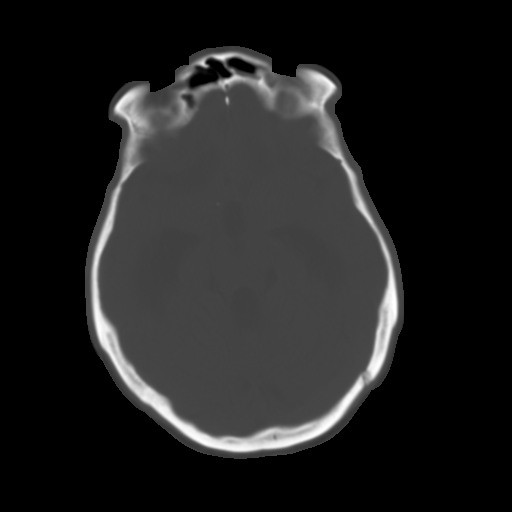
[im 12/28  brain]
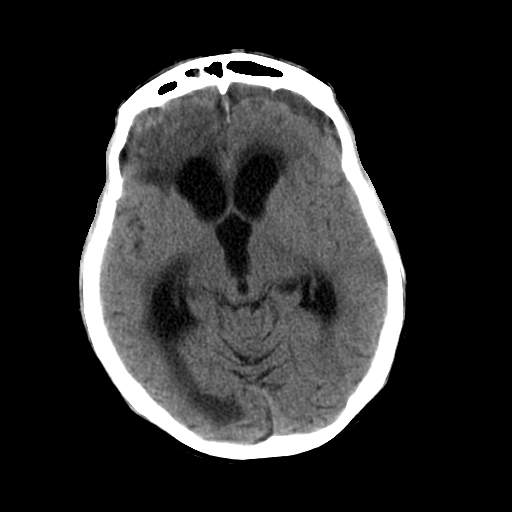
[im 14/28  brain]
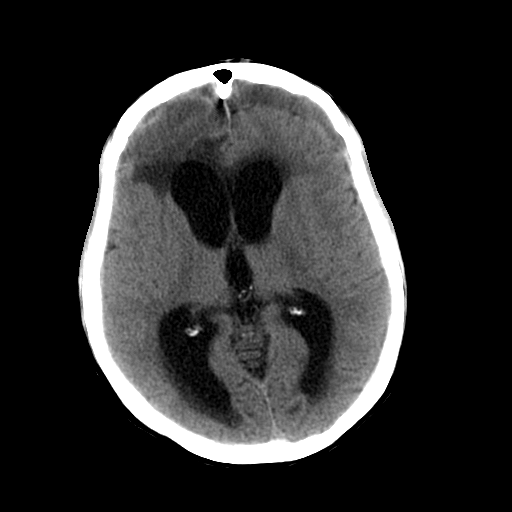
[im 16/28  brain]
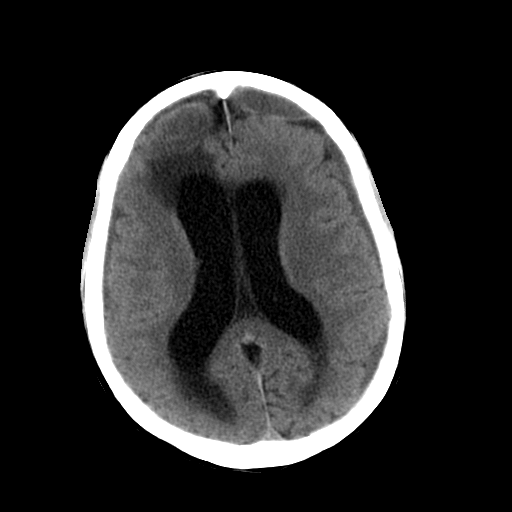
[im 18/28  brain]
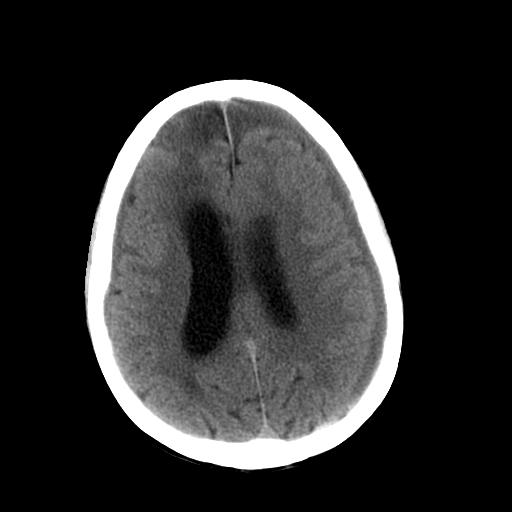
[im 18/28  bone]
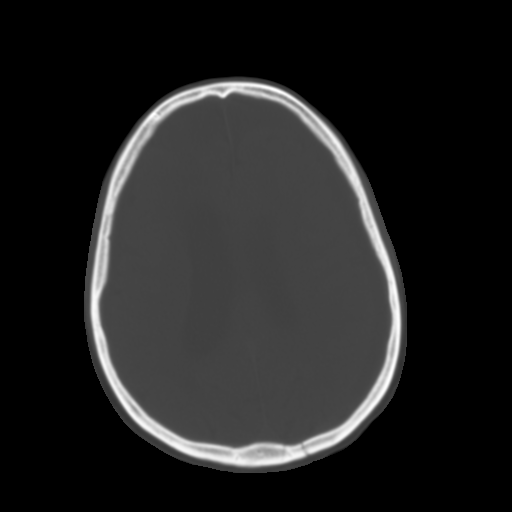
[im 20/28  brain]
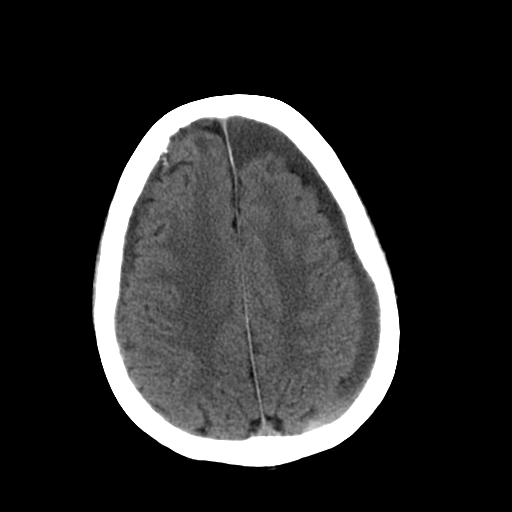
[im 22/28  brain]
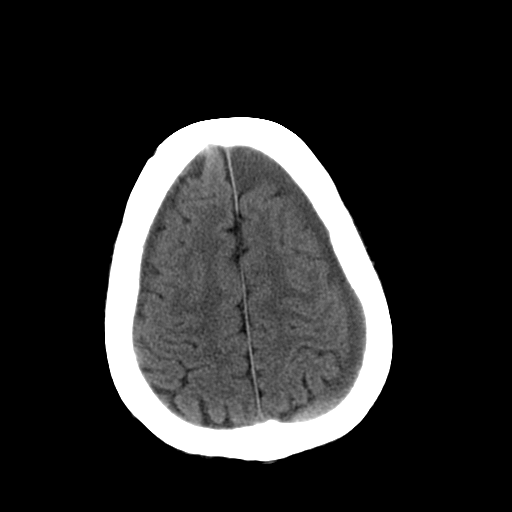
[im 24/28  brain]
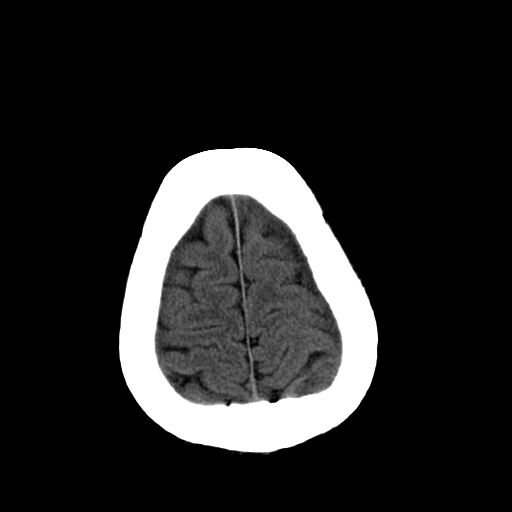
[im 26/28  brain]
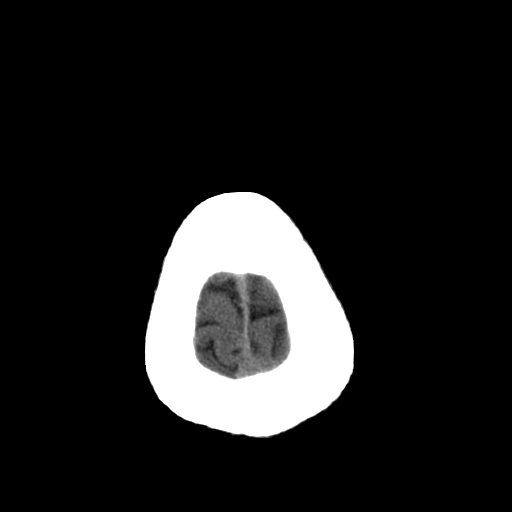
[im 26/28  bone]
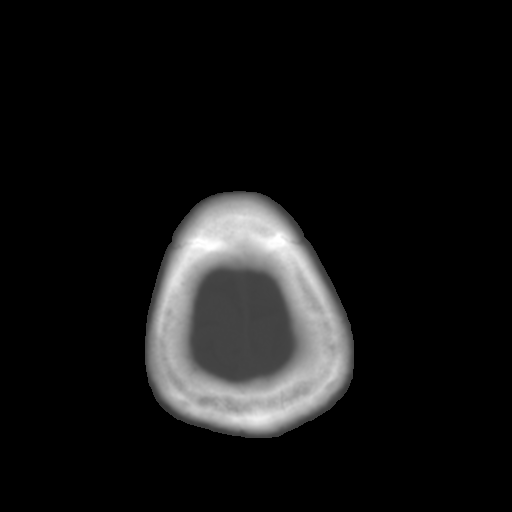

[13 of 30 positions shown; findings below may reference images not displayed]

FINDINGS: The hips are located.  The pelvis is intact.  No fracture is identified.
IMPRESSION: No acute finding. 
RIGHT FEMUR - 4 VIEW:
FINDINGS: Portion of an IM nail is seen in the tibia.  Femur is intact.  There is degenerative change about the knee with near bone on bone medial compartment joint space narrowing.
IMPRESSION: No acute finding with medial compartment degenerative change in the knee noted.  
RIGHT TIB/FIB - 2 VIEW:
FINDINGS: An IM nail with a single proximal and single distal interlocking screw are seen across a healed fracture of the midshaft of the tibia.  No hardware complication with position and alignment anatomical.  Remote healed midshaft fibular fracture noted.  There is no acute bony or joint abnormality.
IMPRESSION: No acute finding in patient with healed tibial and fibular fractures.
HEAD CT WITHOUT CONTRAST:
FINDINGS: Encephalomalacic change is seen in the right frontal lobe.  Patient has subdural hygroma bilaterally larger on the left measuring up to 0.9 cm in diameter.  Ventricular system is diffusely dilated.  There is no evidence of acute hemorrhage, infarct, mass or midline shift.  Imaged paranasal sinuses and mastoid air cells are clear with remote skull fractures on the left noted.
IMPRESSION: 1.  Subdural hygromas bilaterally larger on the left with encephalomalacic change in the right frontal lobe. 
2.  Marked dilatation of ventricular system compatible wiht hydrocephalus.

## 2007-08-01 IMAGING — CR DG CHEST 2V
1 series · 1 of 1 positions shown · non-contrast
Comparison: none

CLINICAL DATA: Confusion. Positive blood cultures.  Lower extremity edema. 
 CHEST ? 2 VIEW:

[view not recorded]
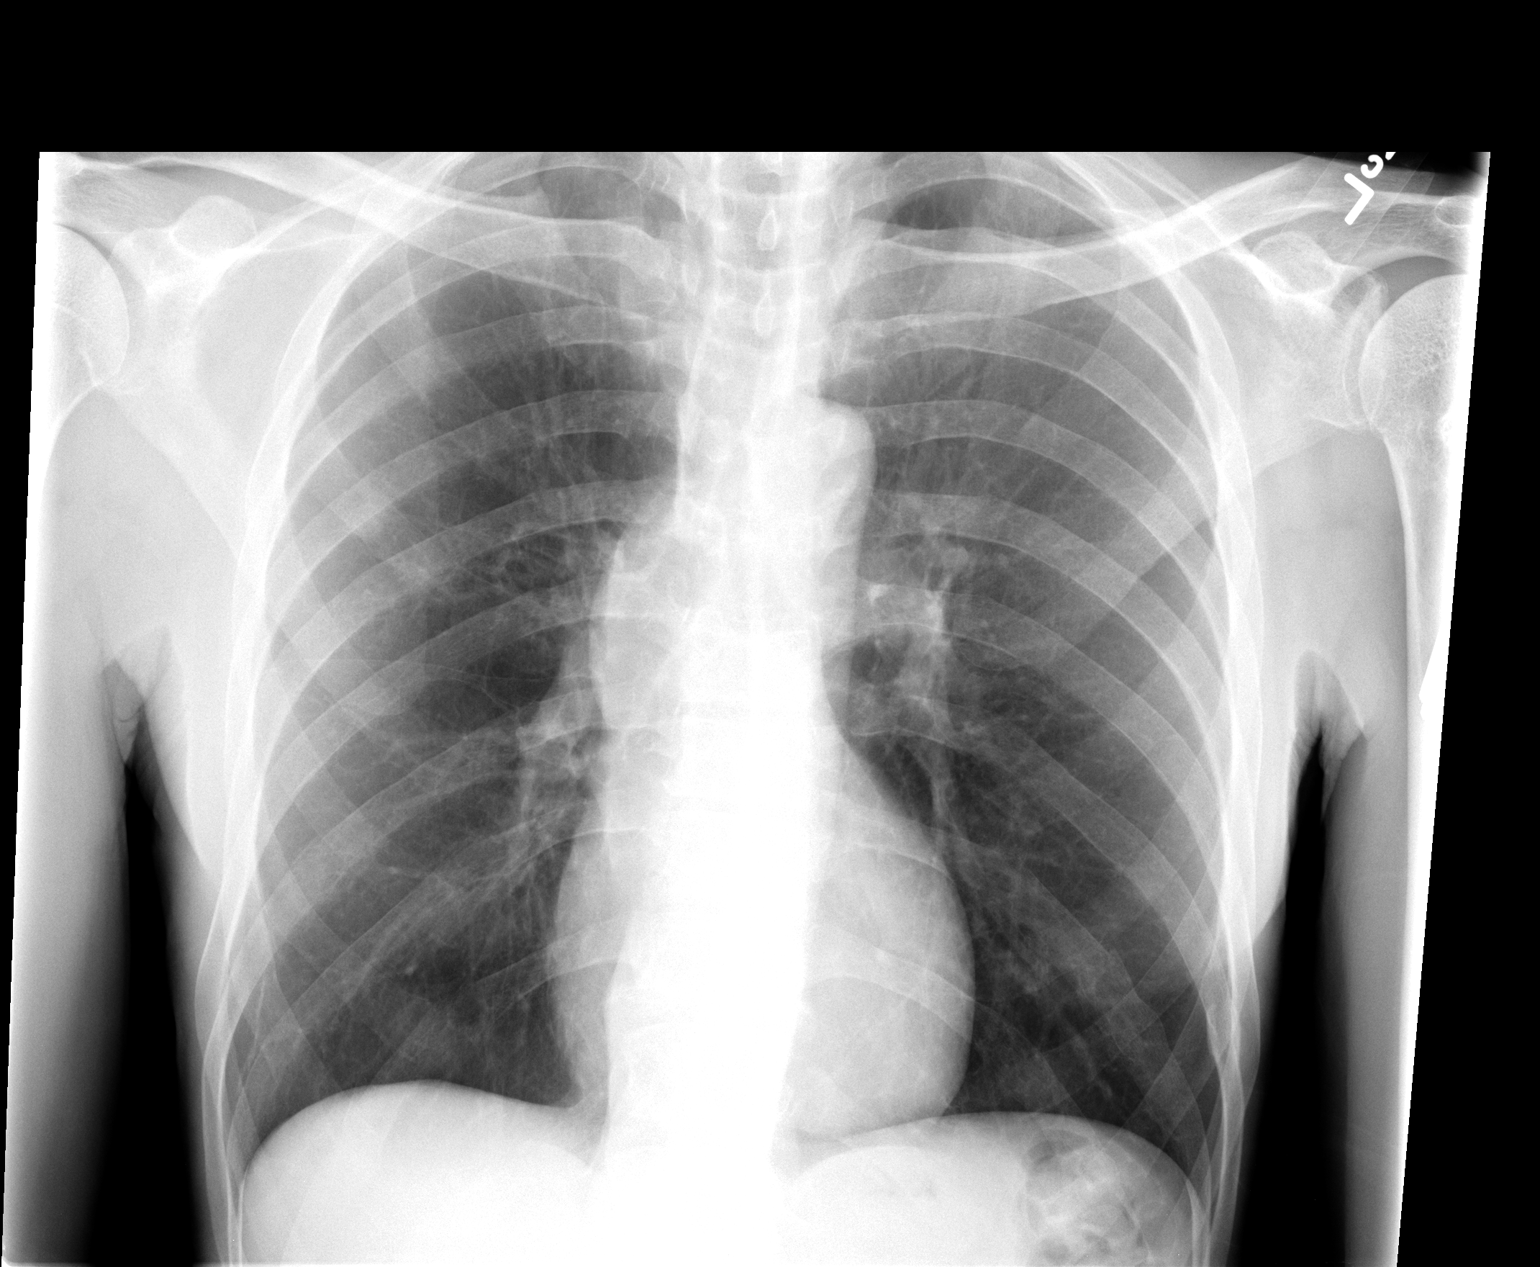

[1 of 1 positions shown; findings below may reference images not displayed]

FINDINGS: Heart size is normal.  Mediastinum is unremarkable except for unfolding of the thoracic aorta.  The lungs are clear.  The vascularity is normal.  No effusions.  No significant bony finding.
IMPRESSION: No active disease.

## 2007-08-02 IMAGING — CT CT PELVIS W/ CM
2 of 5 series · 16 of 46 positions shown, 18 images · IV contrast (APPLIED)
Comparison: Report of CT scan 02/01/03 reviewed.

CLINICAL DATA: Lower abdominal pain.  Nausea.  Anemia.  
ABDOMEN CT WITH CONTRAST:
TECHNIQUE: Multidetector CT imaging of the abdomen was performed following the standard protocol during bolus administration of intravenous contrast.
Contrast:  100 cc Omnipaque 300
TECHNIQUE: Multidetector CT imaging of the pelvis was performed following the standard protocol during bolus administration of intravenous contrast.

[Series 2: abd/pelv with 5.0 b31f st · axial · 0.59mm/px · z∈[-434,-44]mm · 13 of 88 slices shown, 15 images]
[im 5/88  soft-tissue]
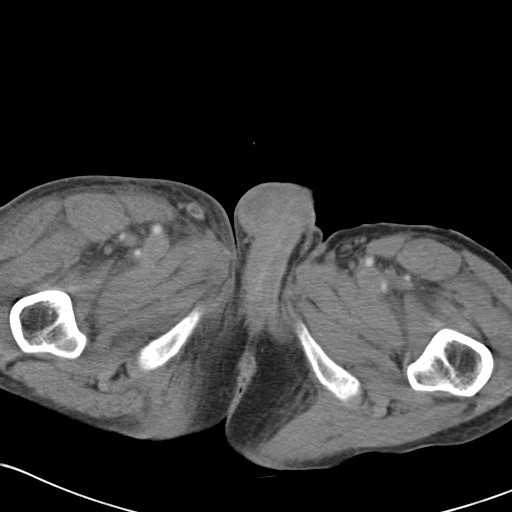
[im 5/88  bone]
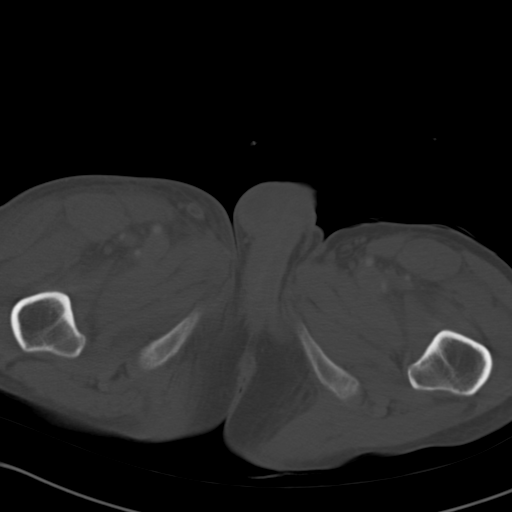
[im 10/88  soft-tissue]
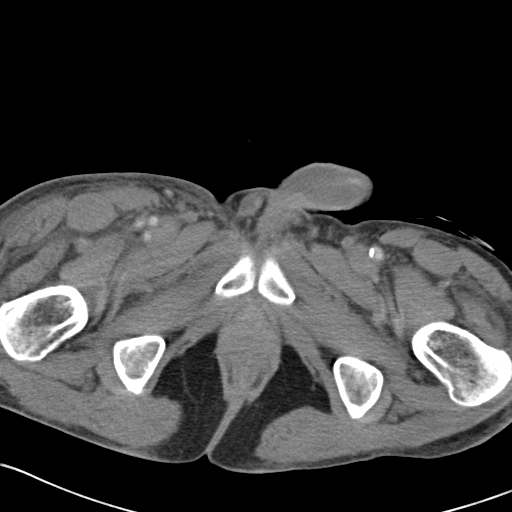
[im 20/88  soft-tissue]
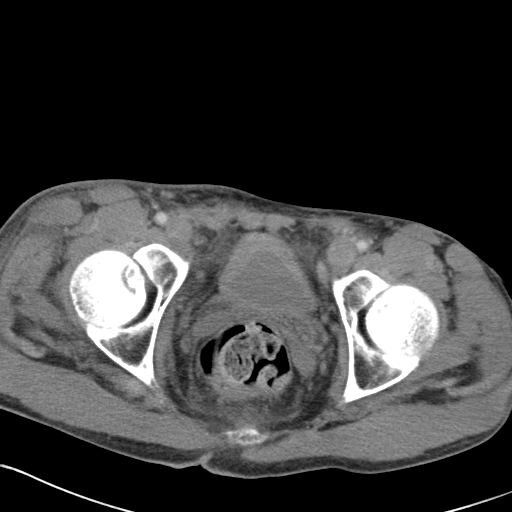
[im 25/88  soft-tissue]
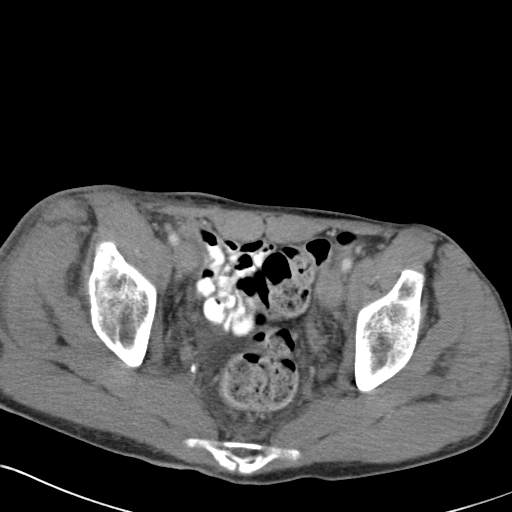
[im 30/88  soft-tissue]
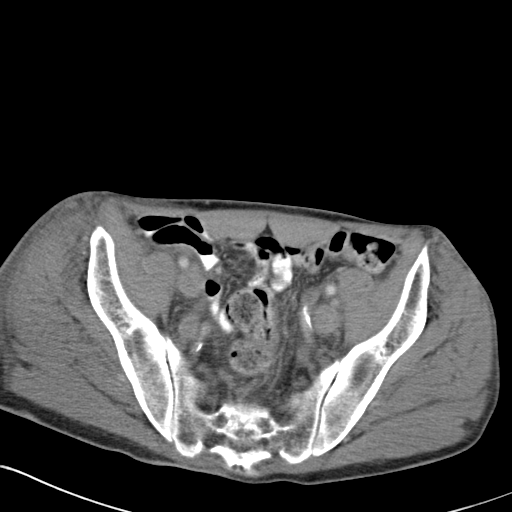
[im 39/88  soft-tissue]
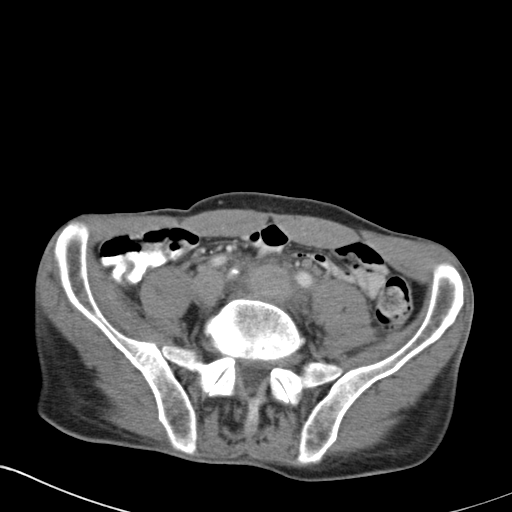
[im 44/88  soft-tissue]
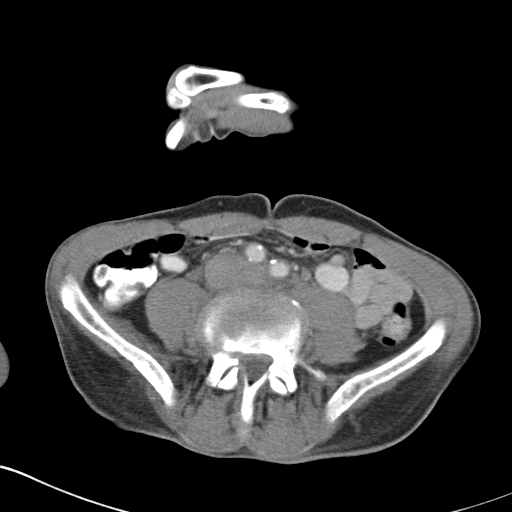
[im 49/88  soft-tissue]
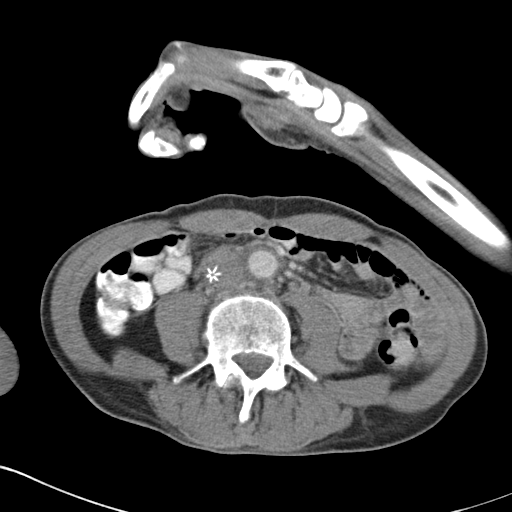
[im 59/88  soft-tissue]
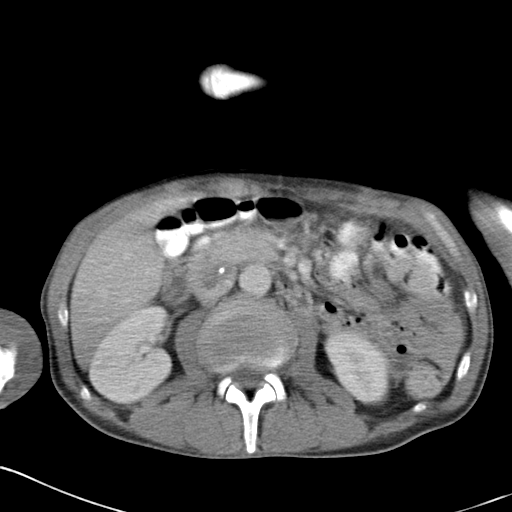
[im 59/88  bone]
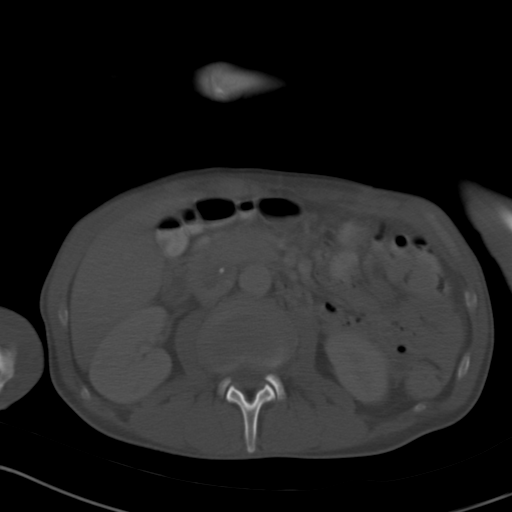
[im 63/88  soft-tissue]
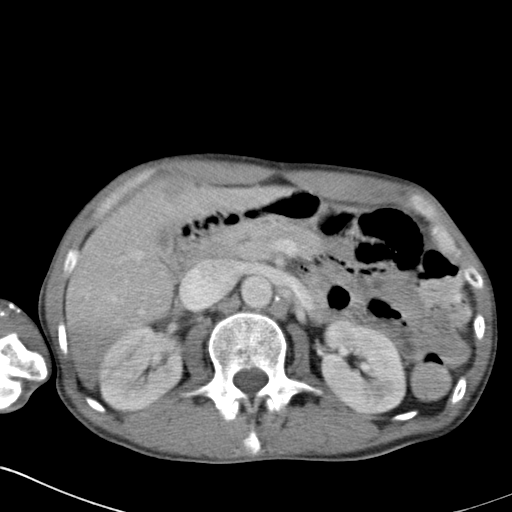
[im 68/88  soft-tissue]
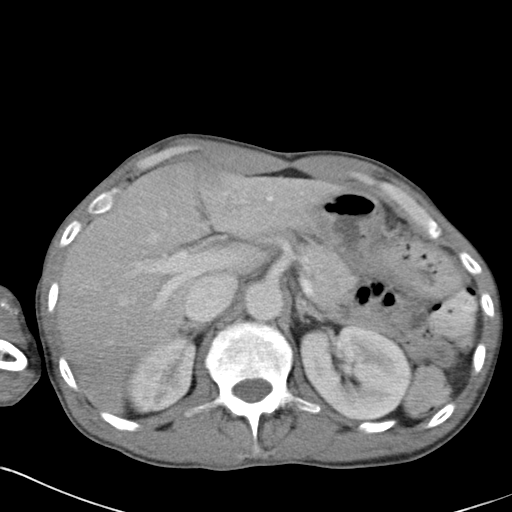
[im 78/88  soft-tissue]
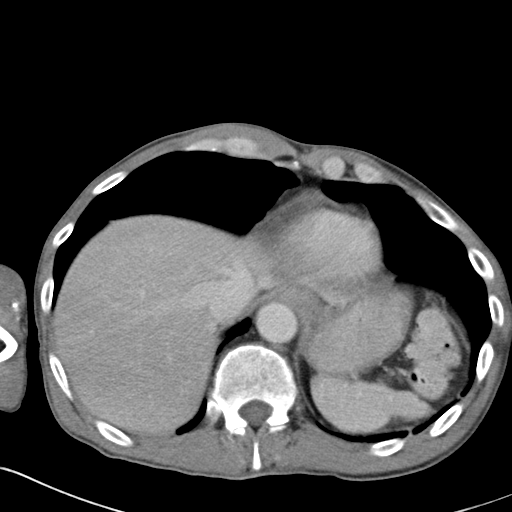
[im 83/88  soft-tissue]
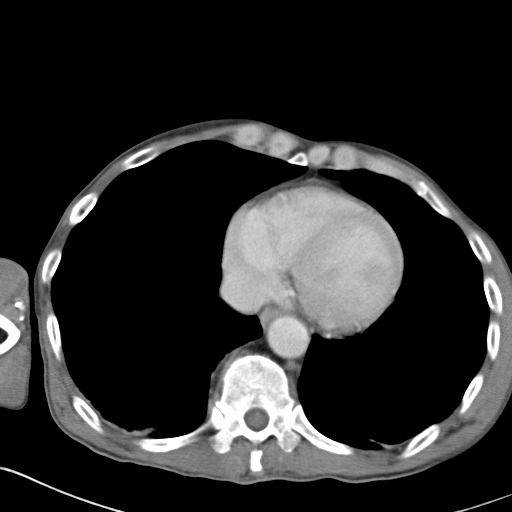

[Series 4: abd/pelv with 2.0 spo st · coronal · 0.86mm/px · 3 of 79 slices shown]
[im 27/79  soft-tissue]
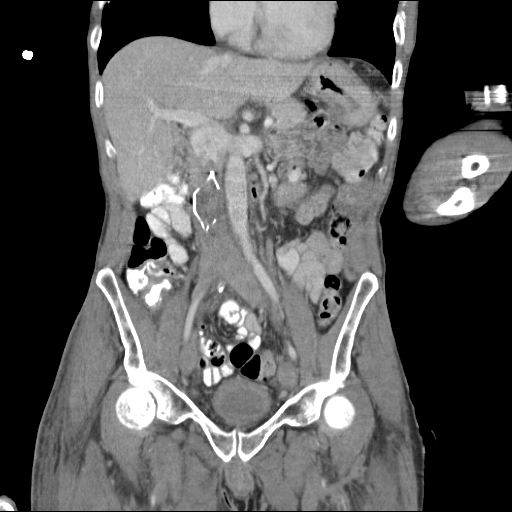
[im 35/79  soft-tissue]
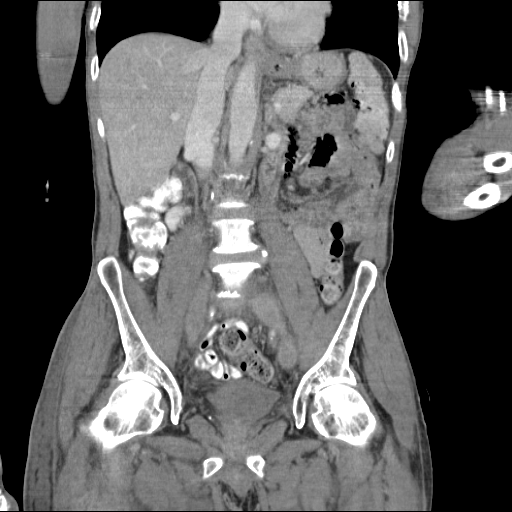
[im 44/79  soft-tissue]
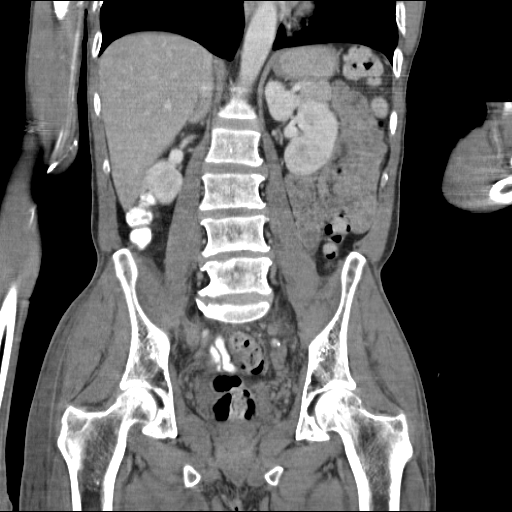

[16 of 46 positions shown; findings below may reference images not displayed]

FINDINGS: There is some mild atelectatic change in the lung bases.  No pleural or pericardial effusion.  
Low attenuation is seen along the inferior aspect of the right lobe of the liver and in the right kidney.  This is compatible with streak artifact as this patient?s right arm is at his side.  Focal area of low attenuation along the falciform ligament on the right likely reflects focal fatty change.  Liver is otherwise unremarkable.  No intrahepatic ductal dilatation.  The spleen, adrenal glands, kidneys, and pancreas are unremarkable.  IVC filter is in place.  No abdominal lymphadenopathy.  Note is made that there is haziness about the inferior vena cava and common iliac veins bilaterally and the veins appear dilated.  There is low attenuation within the patient?s IVC filter and cephalad to it also identified.  Findings are compatible with the presence of thrombus.  No hematoma is identified within the abdomen.  No adenopathy or focal fluid collection.  Stomach and small bowel have an unremarkable CT appearance.
IMPRESSION: Findings compatible with the presence of thrombus within the inferior vena cava and common iliac veins.  Thrombus appears to extend cephalad to patient?s IVC filter. Findings were called to [REDACTED] immediately on interpreatation.
PELVIS CT WITH CONTRAST:
FINDINGS: As noted above, the iliac veins are enlarged with stranding about them suggesting the presence of thrombus.  Urinary bladder is decompressed with the wall thickened.  Colon is unremarkable.  Appendix appears normal.  No focal bony abnormality.
IMPRESSION: Findings compatible with the presence of thrombus in the iliac veins as noted above.

## 2007-08-10 IMAGING — XA IR THROMBOECTOMY MECHANICAL VENOUS
1 series · 14 of 24 positions shown · IV contrast (omnipaque)
Comparison: none

CLINICAL DATA: Extensive bilateral lower extremity, iliac vein, and IVC thrombosis, including complete thrombosis of an indwelling IVC filter.  The patient is status post recent trauma with head injury and intracranial hemorrhage.  He has been started on anticoagulation and is currently receiving IV heparin as well as Coumadin.  There has been a documented strict contraindication to thrombolytic therapy given the recent intracranial hemorrhage.  Further mechanical thrombectomy has now been recommended to provide improved flow in the occluded lower extremities, bilateral iliac veins, and IVC.
ULTRASOUND GUIDANCE FOR VASCULAR ACCESS OF BILATERAL POPLITEAL VEINS:
BILATERAL LOWER EXTREMITY VENOGRAPHY:
IVC VENOGRAPHY:
TRANSCATHETER MECHANICAL THROMBECTOMY OF BILATERAL LOWER EXTREMITY VEINS, BILATERAL ILIAC VEINS, AND THE LOWER IVC:
The patient is unable to give informed consent, and consent was obtained from the patient's wife prior to the procedure. 
Sedation:  2 mg IV Versed, 100 mcg IV fentanyl. 
Total moderate sedation time:  1 hour.
Contrast:  85 cc Omnipaque 300. 
Fluoro time:  8.7 minutes. 
Other medications following the procedure:  Narcan 0.4 mg IV, Romazicon 0.5 mg IV.
The patient was placed in a prone position.  Ultrasound was used to evaluate patency and location of the popliteal veins bilaterally.  The popliteal fossas were sterilely prepped and draped.  During the procedure, local anesthesia was provided with 1% lidocaine. 
Bilateral popliteal venous access was performed under direct ultrasound guidance with micropuncture sets.  Guidewires were advanced.  6-French vascular sheaths were placed. 
Venography was performed of both lower extremities through the indwelling sheaths as well as with staged injection of an AngioJet thrombectomy catheter. 
Extensive mechanical thrombectomy was then performed bilaterally with the AngioJet device beginning in the left lower extremity.  During mechanical suction thrombectomy, periodic formal venograms and contrast injections were performed through the AngioJet catheter with contrast able to be injected around the guidewire.  The mechanical thrombectomy catheter was run up through the femoral vein, common femoral vein, external iliac vein, common iliac vein, and lower IVC.  Contrast injection was also performed at the level of the IVC to evaluate patency.
Mechanical thrombectomy was performed with the AngioJet device in the right lower extremity throughout thrombosed segments of the femoral vein, common femoral vein, external iliac vein, common iliac vein, and lower IVC.  Guidewire was directed in different portions of the indwelling IVC filter and AngioJet thrombectomy performed within the filter extending to just above the filter.
Additional venogram assessment was performed to evaluate flow following mechanical thrombectomy.  After the procedure, both sheaths were removed and hemostasis obtained with manual compression.

[Series 1: run · 14 of 142 slices shown]
[im 1/142]
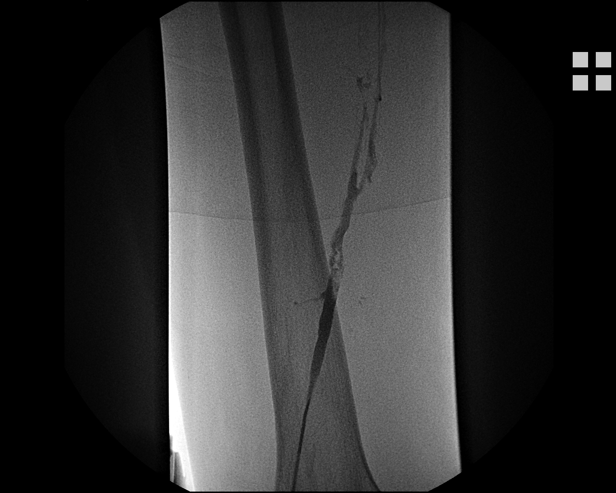
[im 13/142]
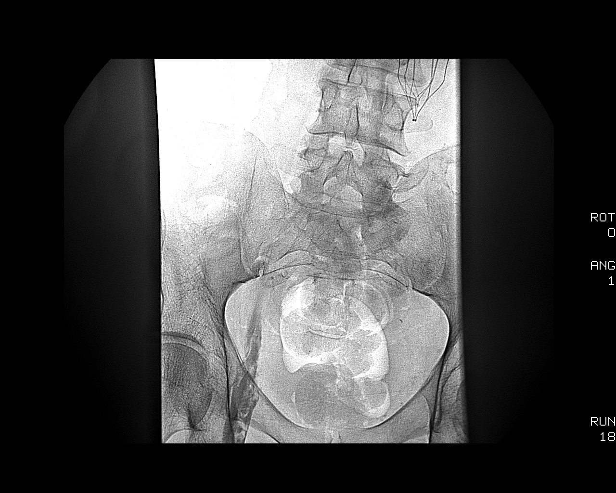
[im 25/142]
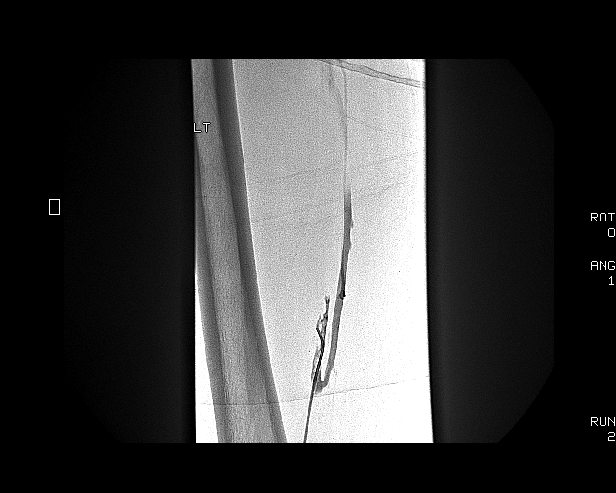
[im 37/142]
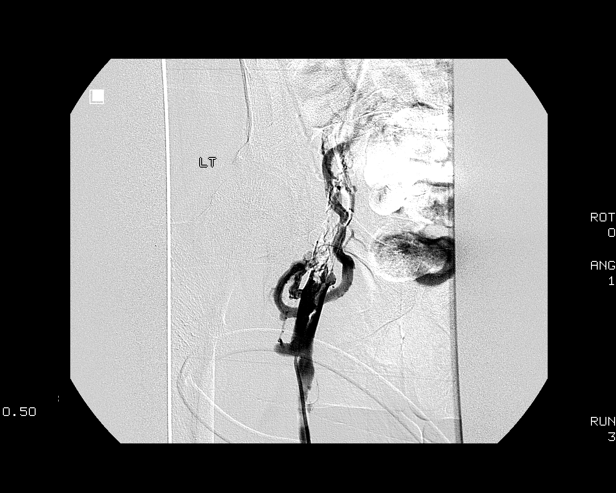
[im 43/142]
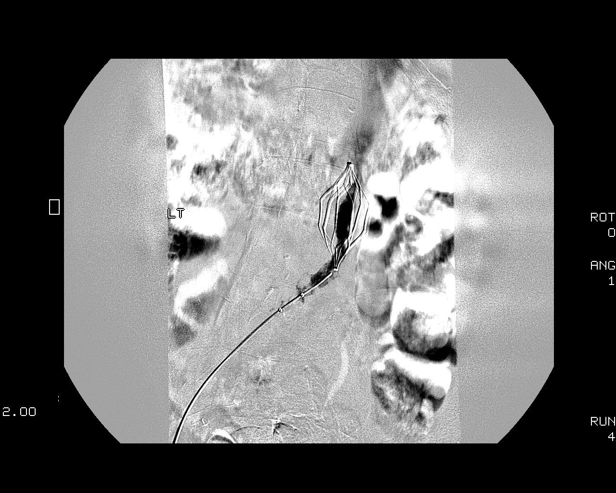
[im 56/142]
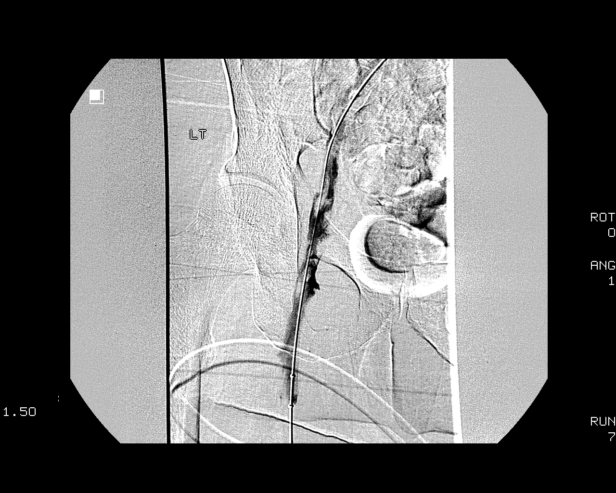
[im 68/142]
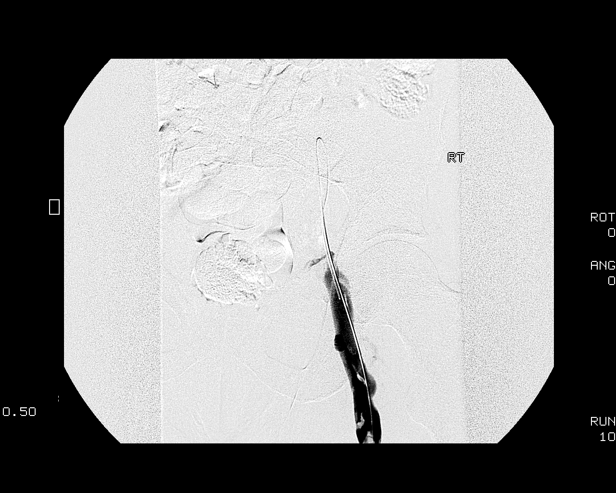
[im 74/142]
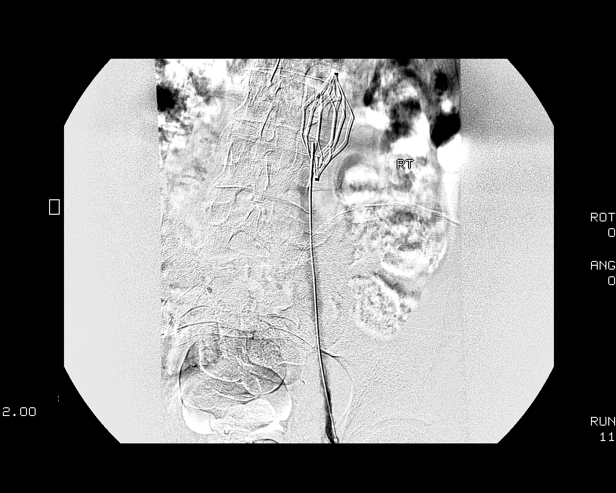
[im 86/142]
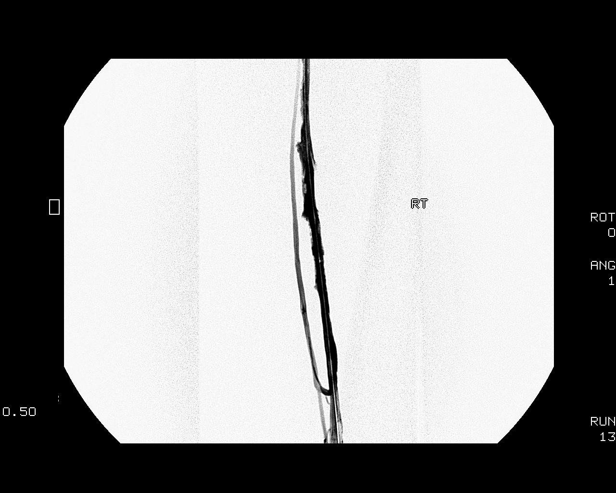
[im 99/142]
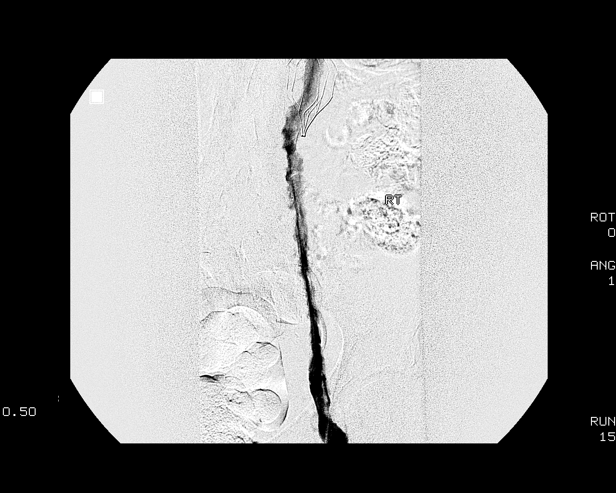
[im 111/142]
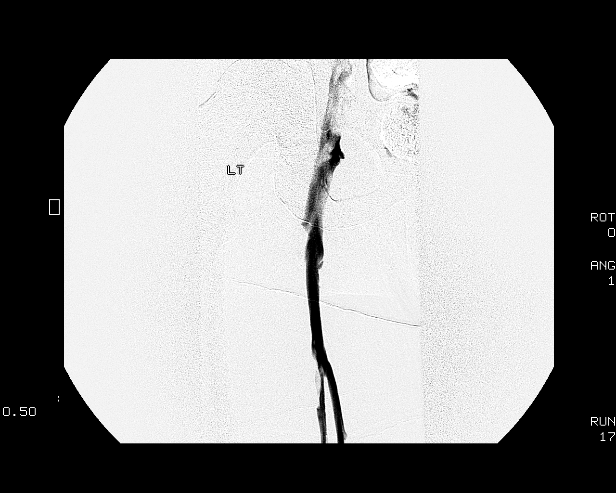
[im 117/142]
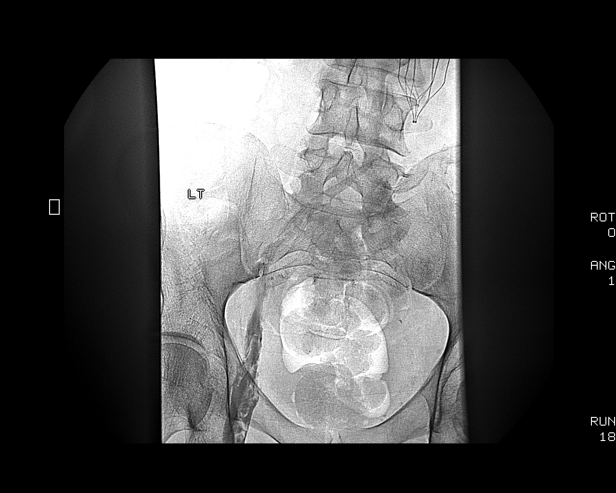
[im 129/142]
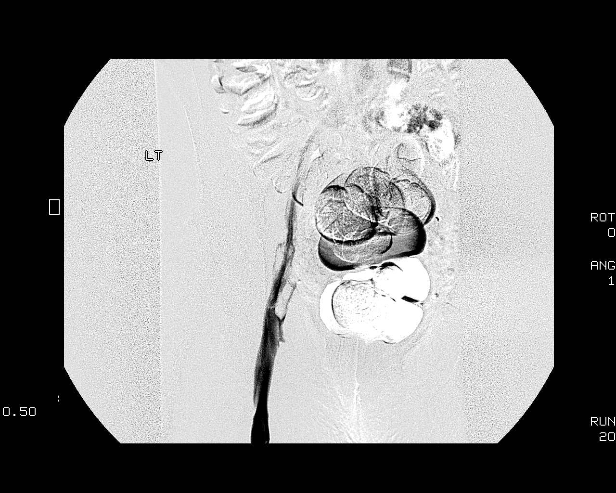
[im 142/142]
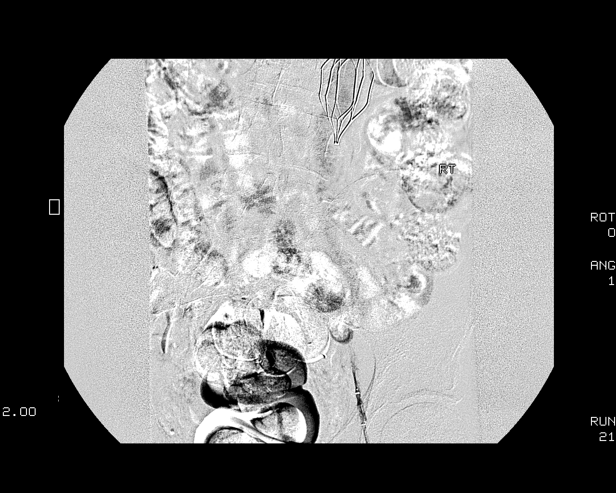

[14 of 24 positions shown; findings below may reference images not displayed]

FINDINGS: Initial ultrasound demonstrates normal patency of the left popliteal vein.  The right popliteal vein shows nonocclusive partial thrombus at the level of the popliteal fossa. 
After establishing venous access, contrast injections show complete occlusion of bilateral deep venous systems in the lower extremities beginning at the level of the distal thighs.  Both common femoral veins and the iliac veins are also completely occluded.  IVC thrombosis is also present extending to the level of the IVC filter.  Above the level of the filter, patent flow was seen. 
Extensive mechanical thrombectomy was performed with the AngioJet device.  This was run for 644 cycles, which is just above the maximum recommended number of cycles due to hemolysis that occurs with use of the thrombectomy catheter.  During the procedure, significant improvement in venous patency was achieved with mechanical thrombectomy.  Patent flow is now present in the femoral veins of the thighs as well as the common femoral veins with component of nonocclusive thrombus remaining present in both common femoral venous segments.  Iliac venous patency was also improved with patent flow now present through bilateral external iliac and common iliac veins after mechanical thrombectomy.  Adherent mural thrombus does cause some residual narrowing of the lumens of the bilateral iliac veins. 
IVC patency at the level of the thrombosed IVC filter was also significantly improved after thrombectomy in various portions of the filter.  Patent flow is now present through the filter.
Upon completion of the procedure, it was difficult to assess the patient?s O2 saturation.  Due to his persistent nonresponsive state from head trauma, it was very difficult to clinically assess the patient.  An arterial blood gas was drawn to assess O2 saturation, which demonstrated a pO2 of 400 on oxygen and O2 saturation of 95%.  Oxygen was therefore decreased.  Dr. Arnous was notified, covering for the [REDACTED].  The patient will be additionally observed for any respiratory compromise, as it is unclear whether any pulmonary embolism occurred during the extensive catheter thrombectomy procedure.  In addition, the patient?s urinary output and renal function, as well as electrolytes, will be followed closely due to hemolysis caused by the thrombectomy catheter.  The patient's urine did turn a reddish color upon completion of the procedure, consistent with hemoglobinuria from hemolysis.  This is expected after the number of cycles of thrombectomy performed.
IMPRESSION: Extensive catheter mechanical thrombectomy of bilateral lower extremity occluded deep veins, bilateral occluded iliac veins, and the occluded lower IVC with the AngioJet device.  Patent flow was reestablished in all of these venous systems with irregular adherent mural thrombus remaining.  Patent flow was also reestablished in the thrombosed IVC filter.  Hemoglobinuria had developed by completion of the procedure due to hemolysis caused by the thrombectomy device.

## 2007-09-11 IMAGING — CT CT HEAD WO/W CM
1 of 2 series · 13 of 30 positions shown, 17 images · IV contrast (75CC OMNI 300)
Comparison: 07/14/2006. 05/29/2006. 05/13/2006.

HEAD CT WITHOUT AND WITH CONTRAST:

CLINICAL DATA: Mental status changes
TECHNIQUE: 5mm collimated images were obtained from the base of the skull
through the vertex according to standard protocol before and after
administration of intravenous contrast.

Contrast:  75 cc Omnipaque 300

[Series 32: 3d filtered head w/o · axial · non-contrast · 0.49mm/px · z∈[+34,+171]mm · 13 of 28 slices shown, 17 images]
[im 2/28  brain]
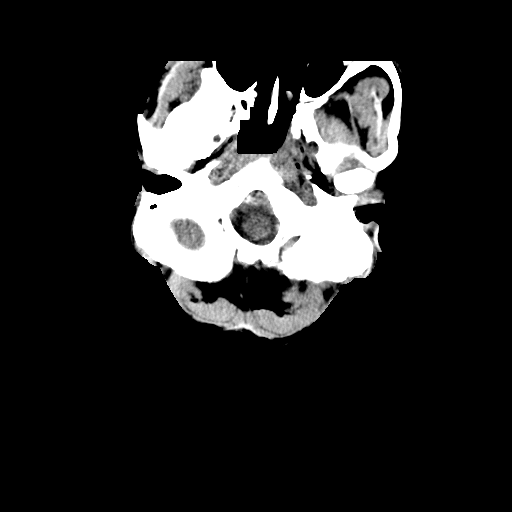
[im 2/28  bone]
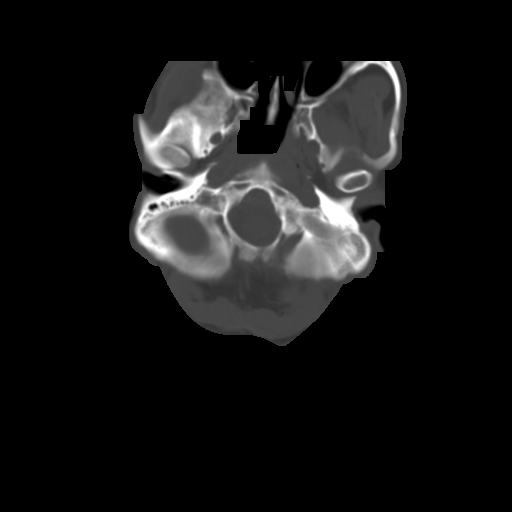
[im 4/28  brain]
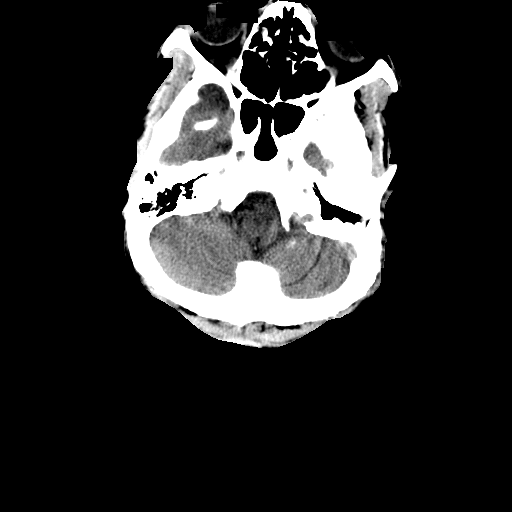
[im 6/28  brain]
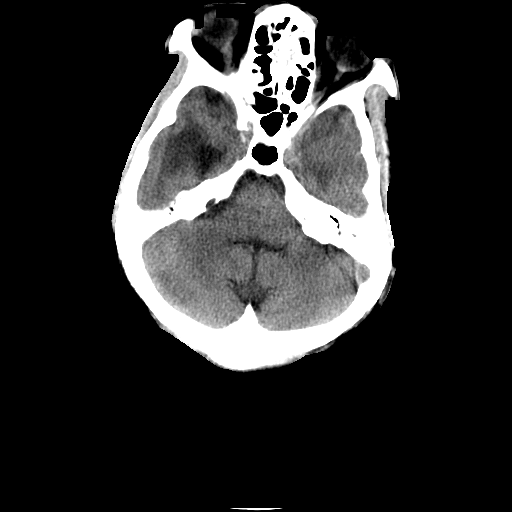
[im 8/28  brain]
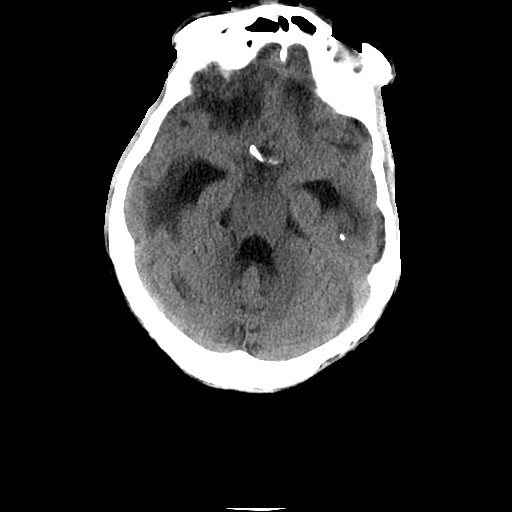
[im 10/28  brain]
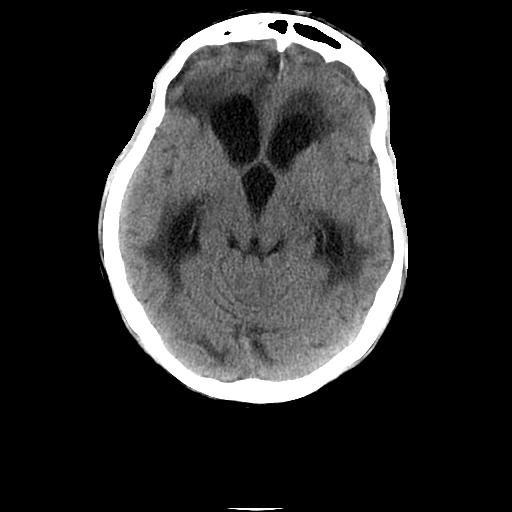
[im 10/28  bone]
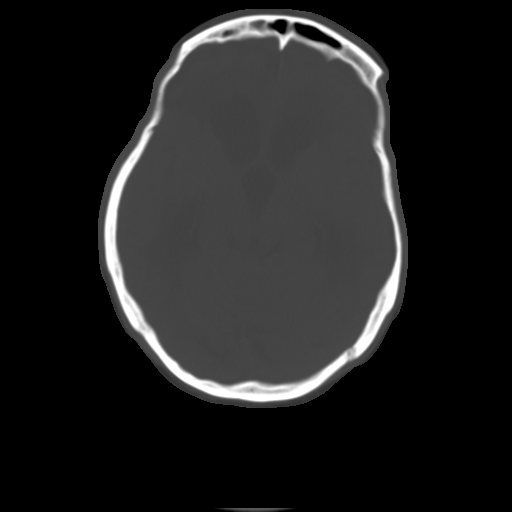
[im 12/28  brain]
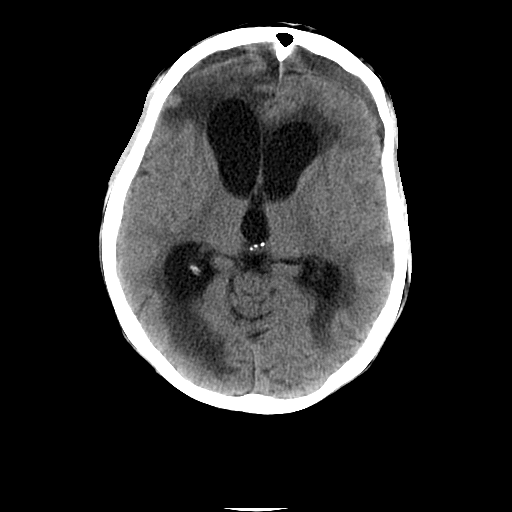
[im 14/28  brain]
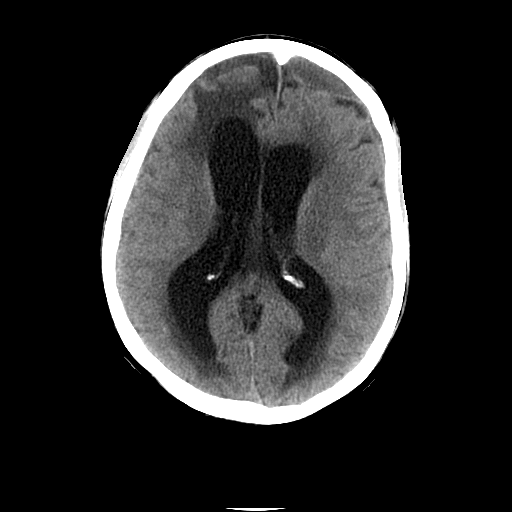
[im 16/28  brain]
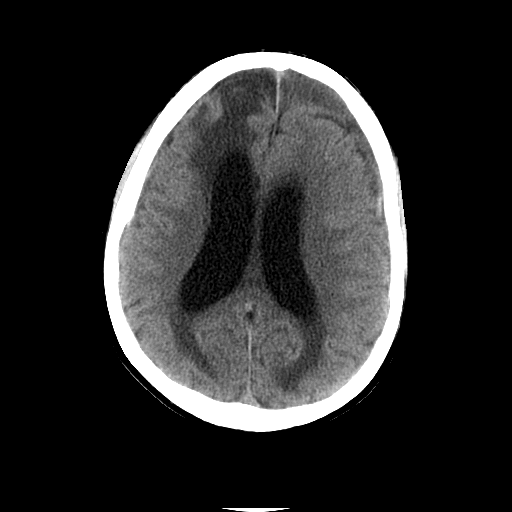
[im 18/28  brain]
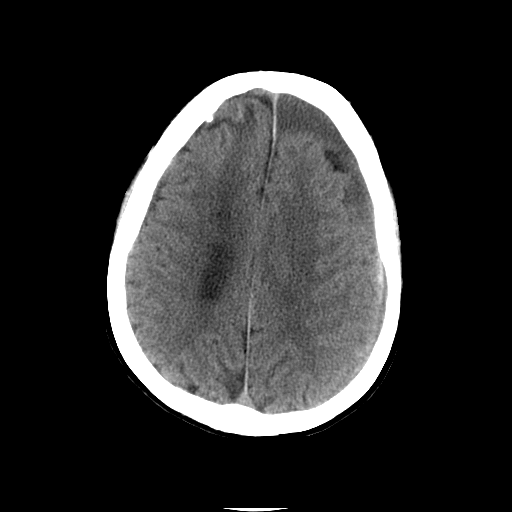
[im 18/28  bone]
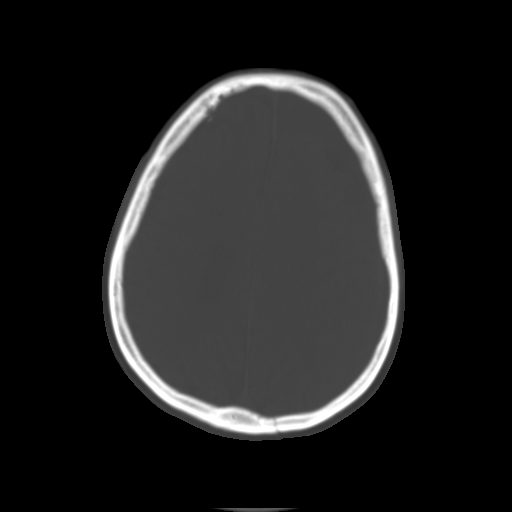
[im 20/28  brain]
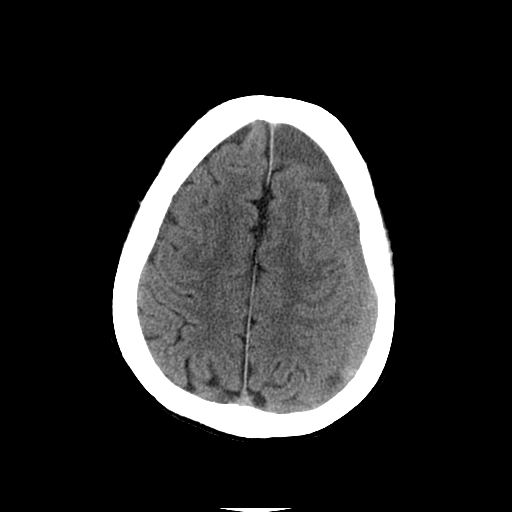
[im 22/28  brain]
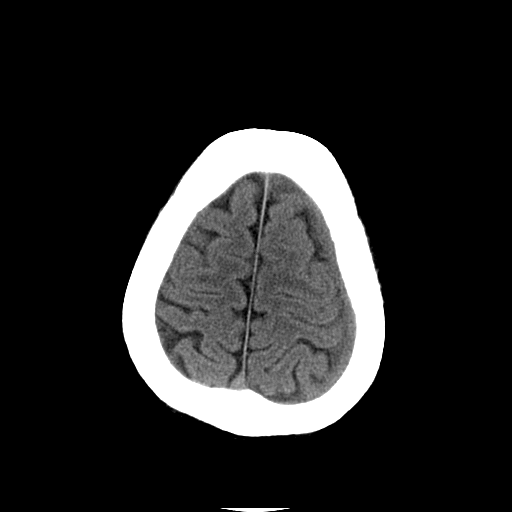
[im 24/28  brain]
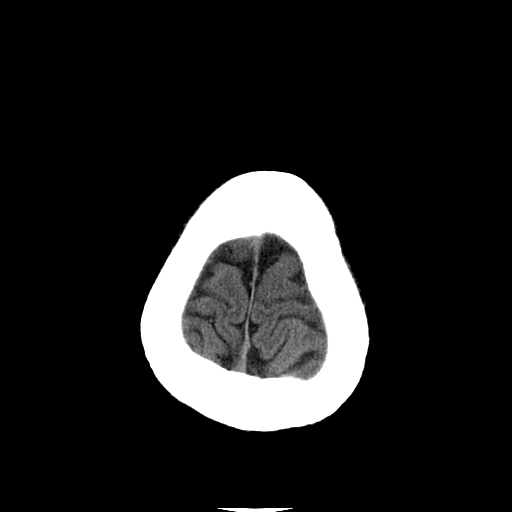
[im 26/28  brain]
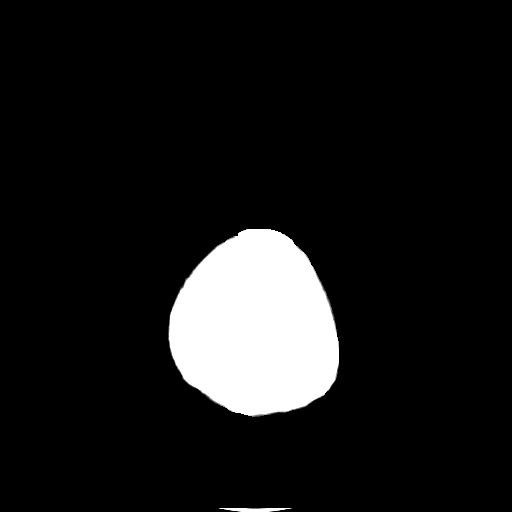
[im 26/28  bone]
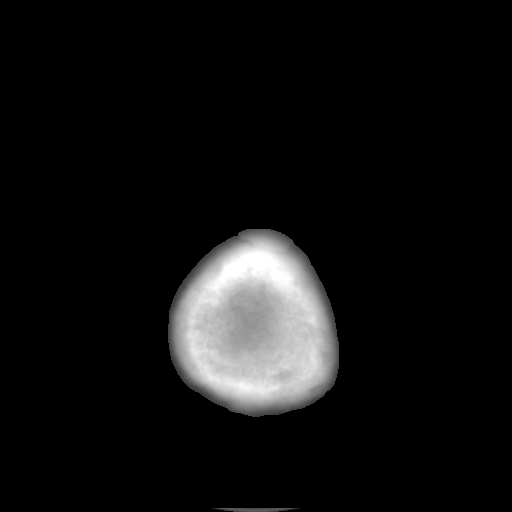

[13 of 30 positions shown; findings below may reference images not displayed]

FINDINGS: Chronic bilateral subdural hematomas or subdural hygromas persist but
are not substantially changed in the interval. There is fairly extensive
encephalomalacia in the right frontal lobe, at the site of the previously seen
hemorrhagic frontal contusion. No evidence for interval acute rebleeding.

The ventricular size is globally increased and while not substantially
progressed when compared to 07/14/2006, ventricular size has clearly increased
when comparing back to the next most recent comparison study of 05/29/2006. There
is low attenuation around the lateral ventricles raising the question of
transependymal CSF flow. The temporal horns are dilated. There is some dural
enhancement underlying the chronic extra-axial fluid collections although no
intraparenchymal enhancing mass lesion is identified. No definite CT evidence to
suggest acute ischemia is apparent.

Bone windows demonstrate the left temporal bone fracture is still apparent. The
right temporal bone fracture is not well demonstrated.
IMPRESSION: Stable dilation of the ventricular system globally suggests hydrocephalus.

No substantial interval change in the chronic extra-axial bilateral fluid
collections. 5 mm of left-to-right midline shift is stable. 

I called the results of this study directly to Dr. Artoshi at the time of
study interpretation.

## 2007-09-12 ENCOUNTER — Emergency Department (HOSPITAL_COMMUNITY): Admission: EM | Admit: 2007-09-12 | Discharge: 2007-09-13 | Payer: Self-pay | Admitting: Emergency Medicine

## 2007-12-20 IMAGING — CT CT HEAD W/O CM
1 series · 16 of 28 positions shown, 20 images · non-contrast
Comparison: 08/25/06.

CLINICAL DATA: Follow-up subdural hematoma.
 HEAD CT WITHOUT CONTRAST ? 12/03/06:
TECHNIQUE: Contiguous axial CT images were obtained from the base of the skull through the vertex according to standard protocol without contrast.

[Series 32: 3d filtered head · axial · 0.49mm/px · z∈[+8,+139]mm · 16 of 28 slices shown, 20 images]
[im 2/28  brain]
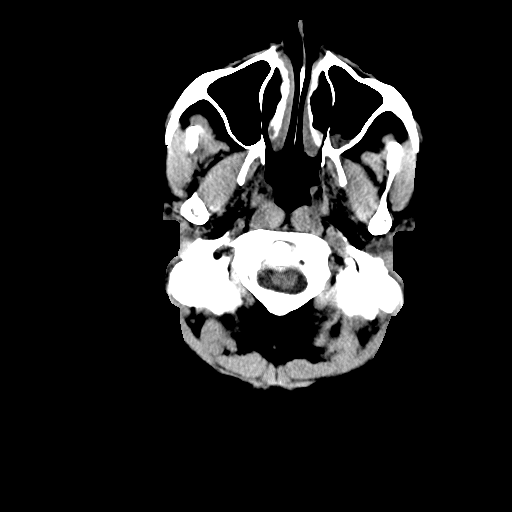
[im 2/28  bone]
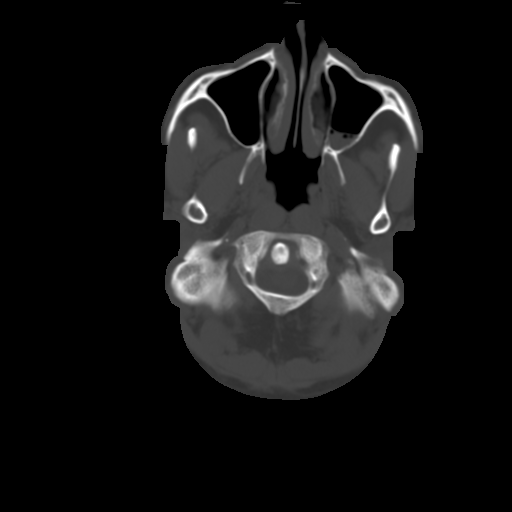
[im 4/28  brain]
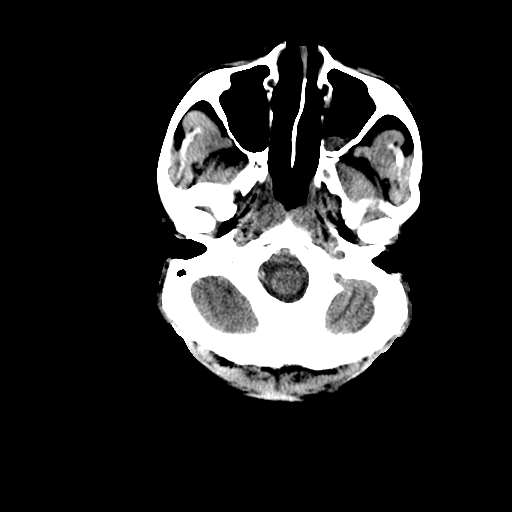
[im 6/28  brain]
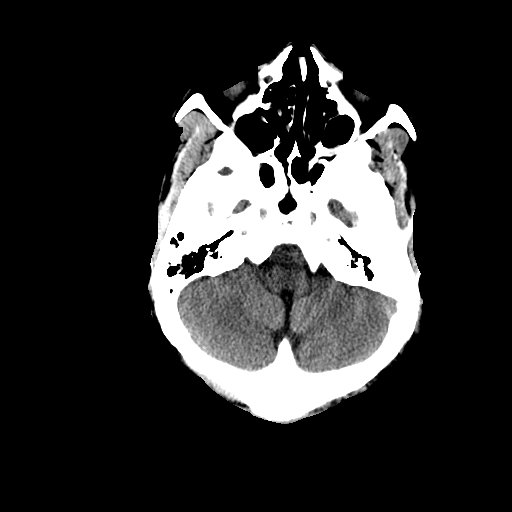
[im 7/28  brain]
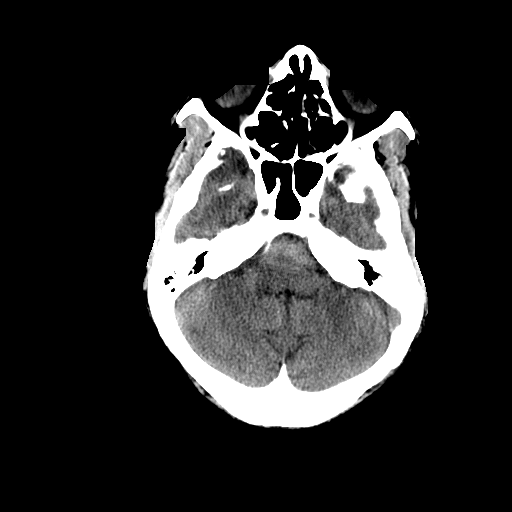
[im 9/28  brain]
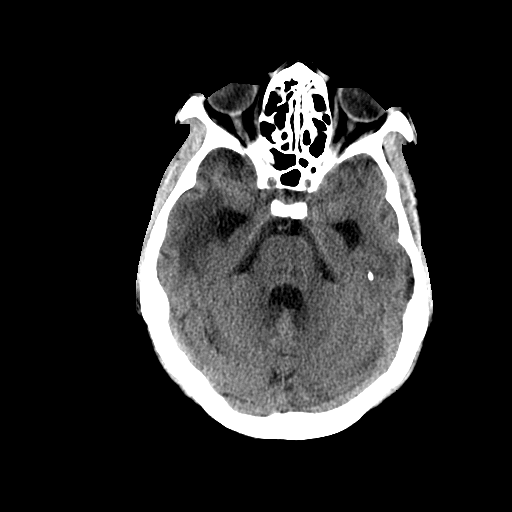
[im 9/28  bone]
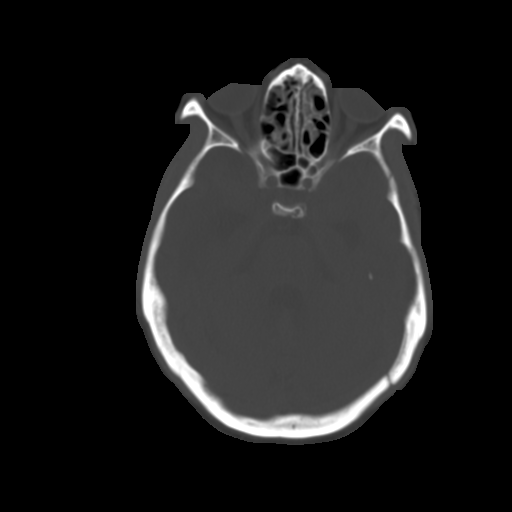
[im 10/28  brain]
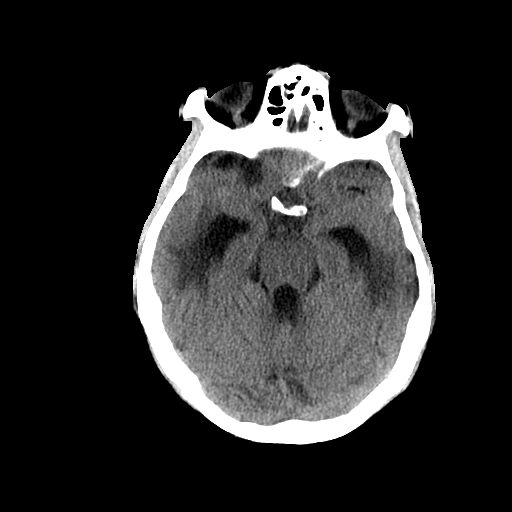
[im 12/28  brain]
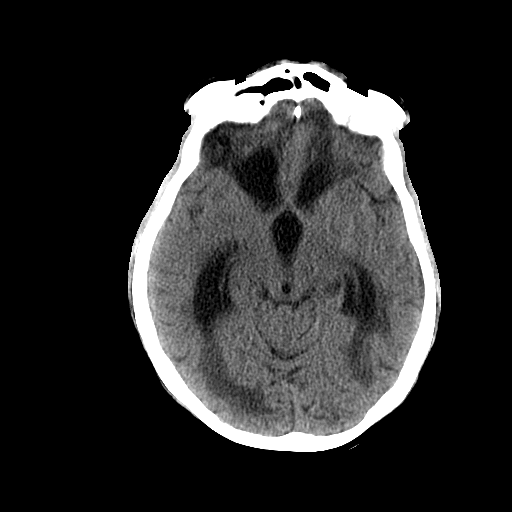
[im 14/28  brain]
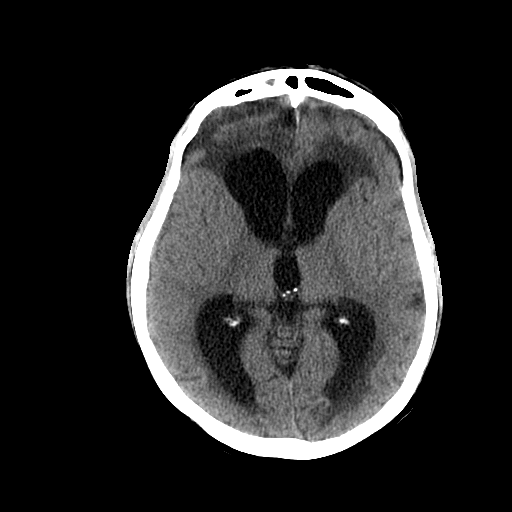
[im 15/28  brain]
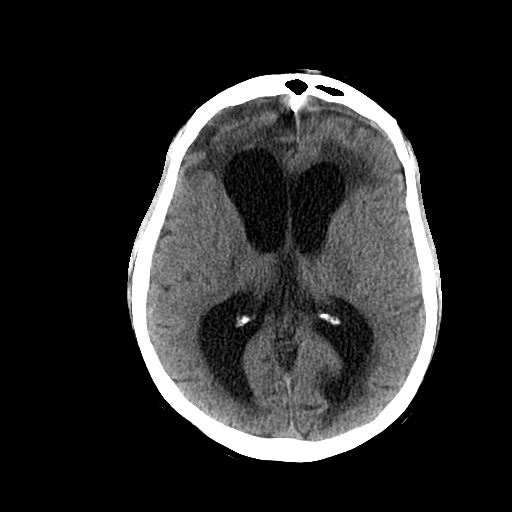
[im 15/28  bone]
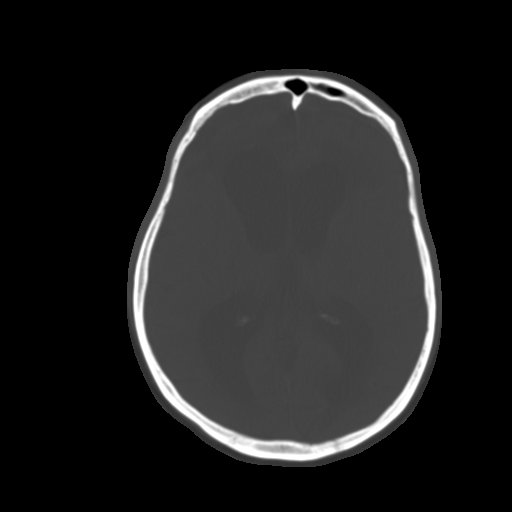
[im 17/28  brain]
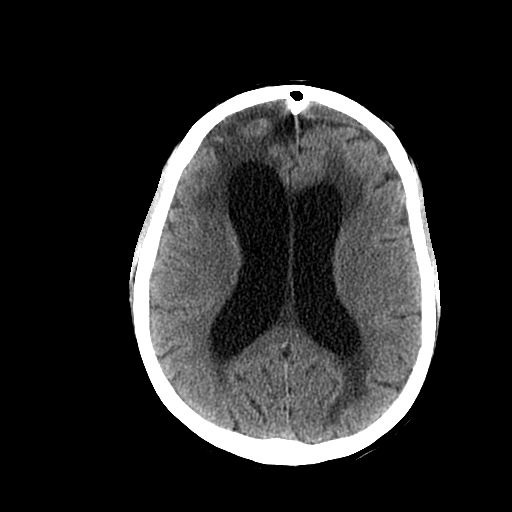
[im 19/28  brain]
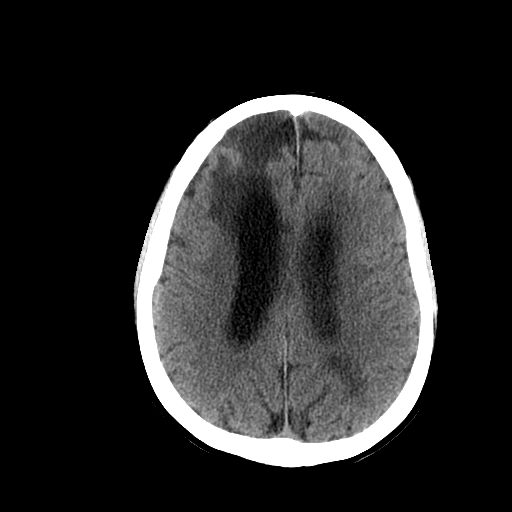
[im 20/28  brain]
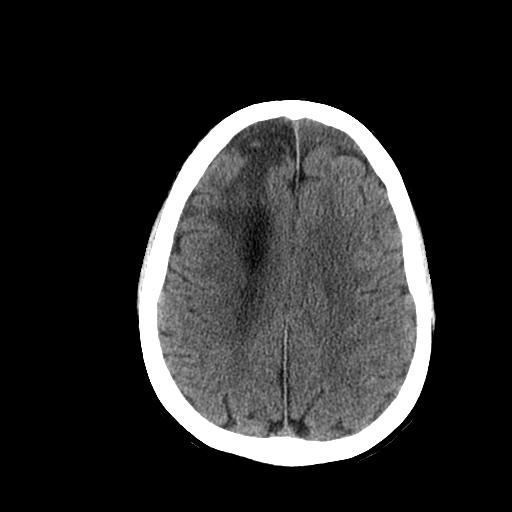
[im 22/28  brain]
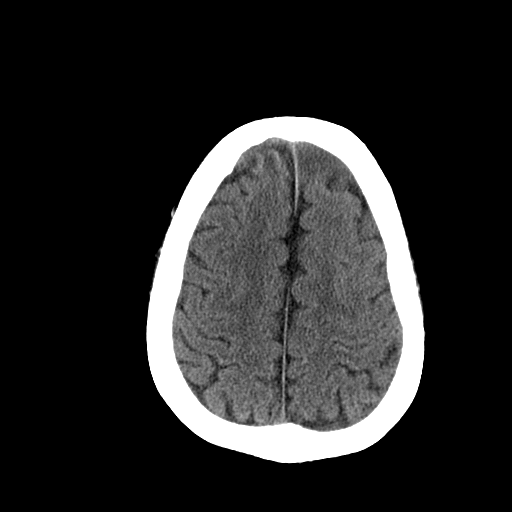
[im 22/28  bone]
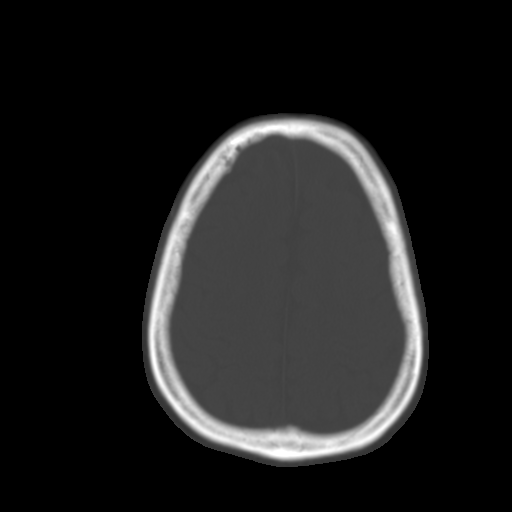
[im 23/28  brain]
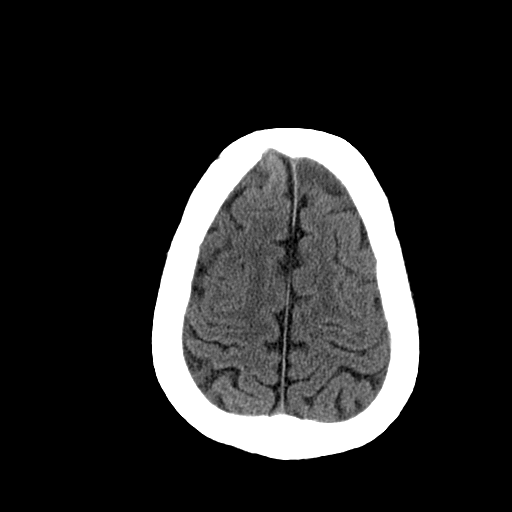
[im 25/28  brain]
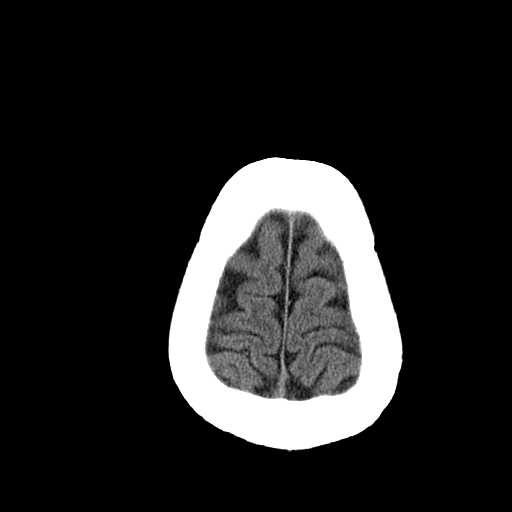
[im 27/28  brain]
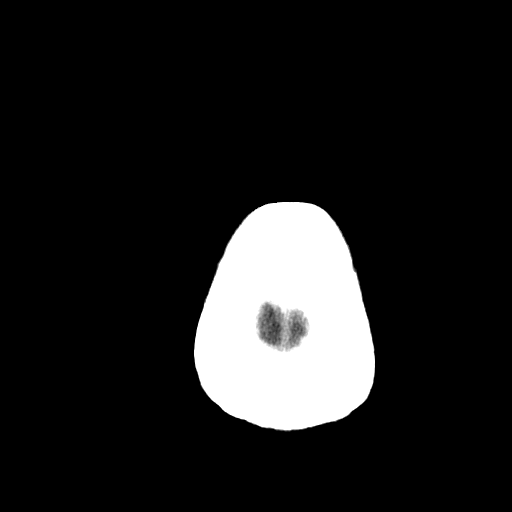

[16 of 28 positions shown; findings below may reference images not displayed]

FINDINGS: Bilateral extra-axial fluid collections are again seen with slight decrease in size on the left.  Marked encephalomalacia is seen in the right frontal lobe.  The ventricles are dilated but stable from the most recent prior exam.  Periventricular low attenuation.  No evidence of acute infarct or acute hemorrhage.  Mucosal thickening is seen in the ethmoid air cells.  Frothy secretions are seen in the left maxillary sinus.  Chronic appearing left mastoid effusion.
IMPRESSION: 1.  Bilateral extra-axial fluid collections with slight improvement on the left.
 2.  Atrophy, chronic microvascular ischemic changes, and right frontal lobe encephalomalacia.

## 2008-05-08 IMAGING — CR DG ABDOMEN 1V
1 series · 1 of 1 positions shown · non-contrast
Comparison: 07/16/2006

CLINICAL DATA: Seizure; shunt tubing.

ABDOMEN - 1 VIEW

[t abdomen supine]
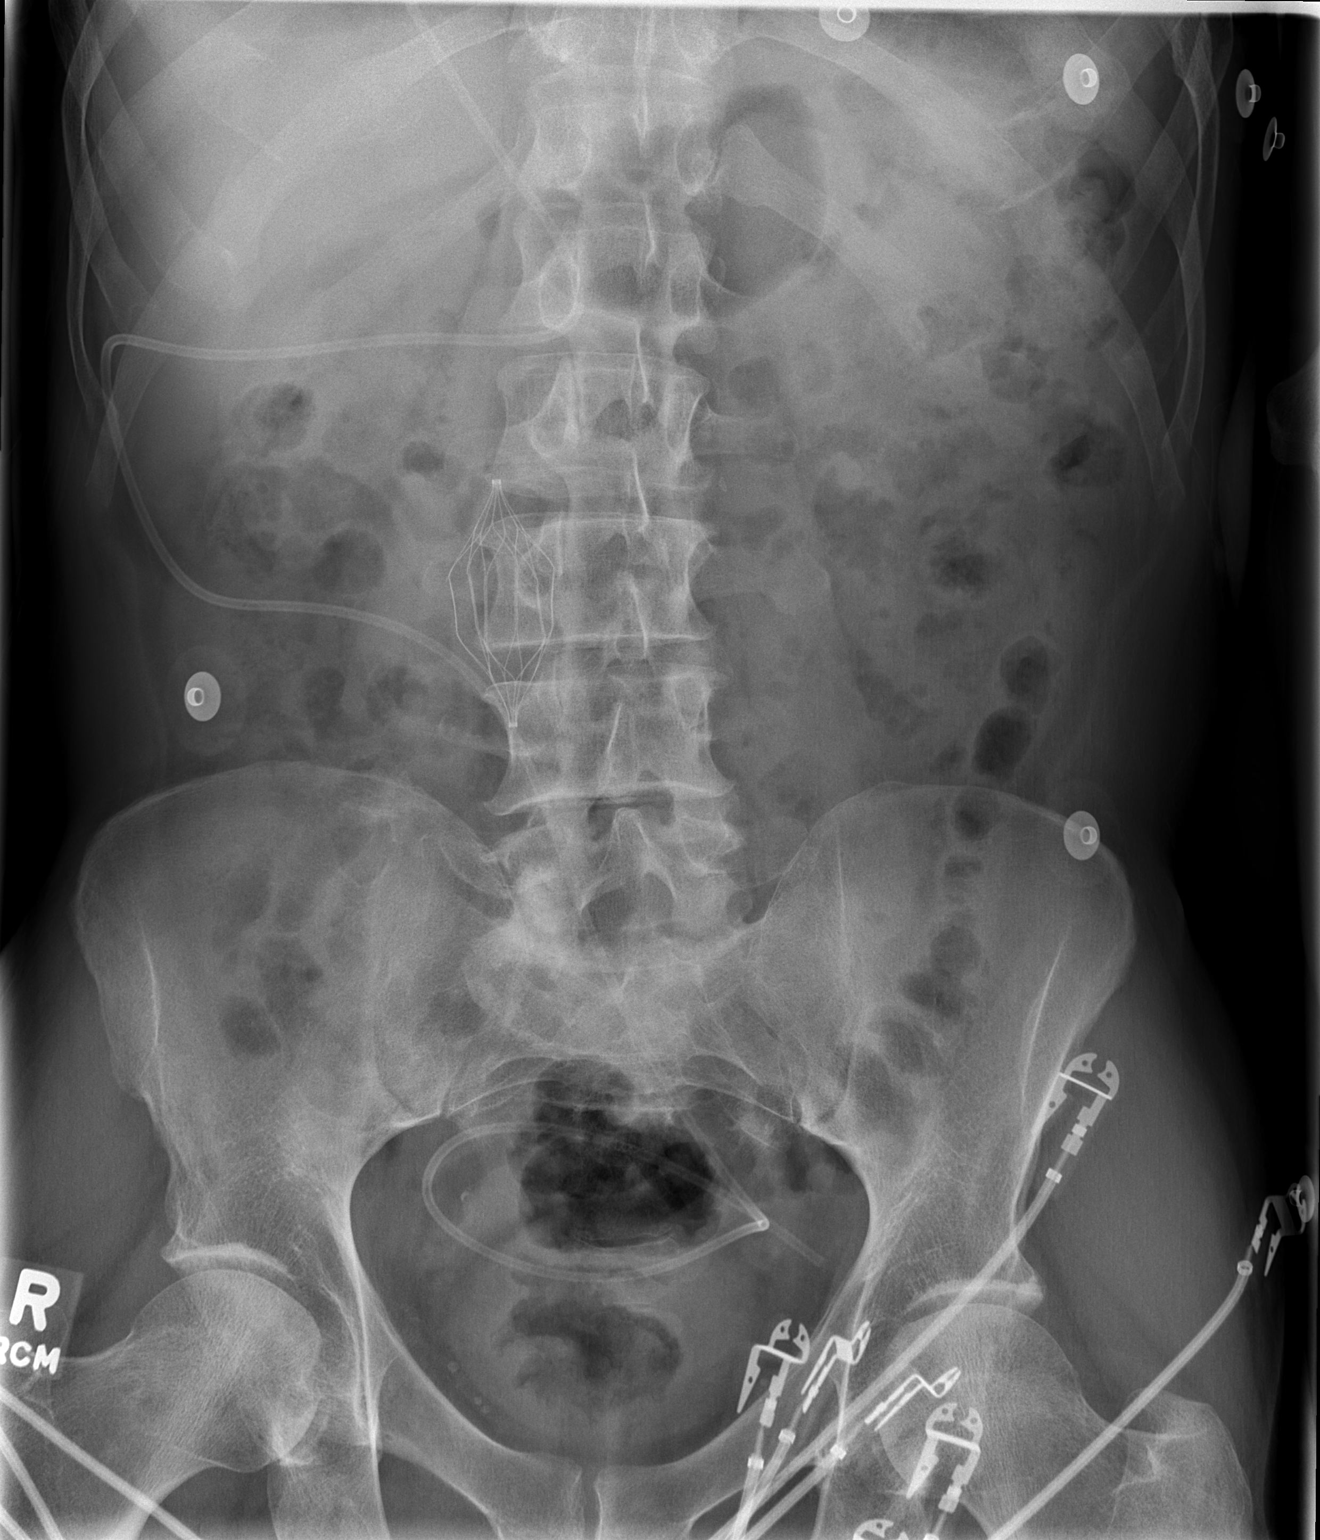

[1 of 1 positions shown; findings below may reference images not displayed]

FINDINGS: Ventriculoperitoneal shunt is noted extending down into the pelvis.
No shunt tubing discontinuity is identified. IVC filter noted. There is mild
spondylosis at the L5-S1 level eccentric to the right. Bowel gas pattern appears
unremarkable. No significant abnormal calcific densities are identified.
IMPRESSION: 1. Continuous appearing ventriculoperitoneal shunt extends along the right
abdomen to coil in the pelvis, with its tip in the left anatomic pelvis.
2. IVC filter noted.

## 2008-05-08 IMAGING — CR DG CHEST 1V
1 series · 1 of 1 positions shown · non-contrast
Comparison: 07/15/2006

CLINICAL DATA: Seizure.

PORTABLE CHEST - 1 VIEW

[t chest supine]
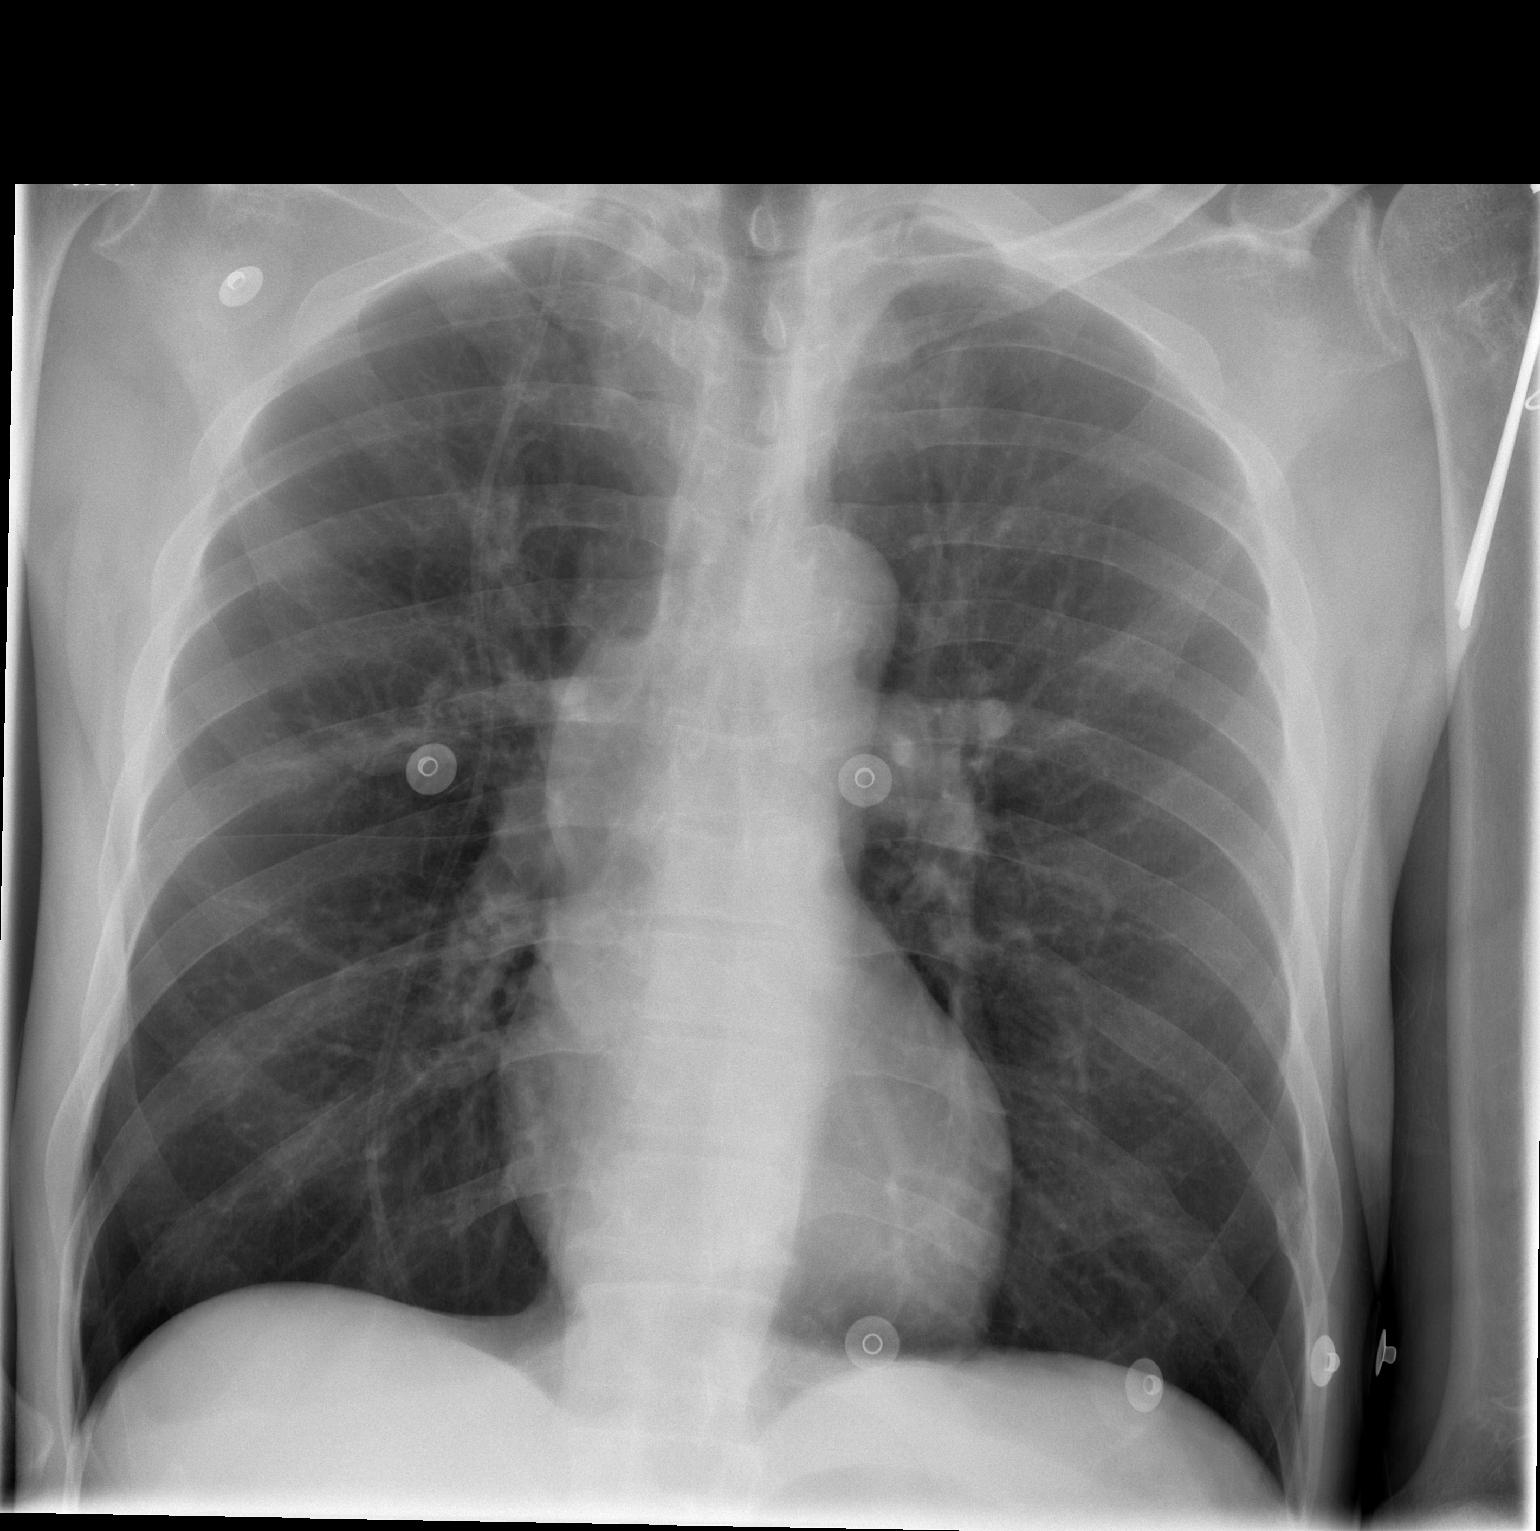

[1 of 1 positions shown; findings below may reference images not displayed]

FINDINGS: Shunt tubing is noted extending down the right neck and along the
right chest. No discontinuity in the tubing is apparent in the visualized field.

Emphysema is present. The lungs appear clear. Mild prominence of the right
border of the ascending aorta is due to mild tortuosity.  

IMPRESSION

1. Emphysema.
2. Visualized portion of the shunt tubing appears intact.

## 2008-05-08 IMAGING — CT CT HEAD W/O CM
1 of 2 series · 16 of 30 positions shown, 20 images · IV contrast (agent unspecified)
Comparison: 12/03/06.

CLINICAL DATA: 55-year-old with seizurelike activity.  Patient had recent head trauma was placed on seizure medications and shunt was placed.  Patient?s family reports patient is noncompliant with meds.  Alcohol abuse.  
HEAD CT WITHOUT CONTRAST:
TECHNIQUE: Contiguous axial images were obtained from the base of the skull through the vertex according to standard protocol without contrast.

[Series 3: recon 2: brain · axial · 0.47mm/px · z∈[+161,+287]mm · 16 of 56 slices shown, 20 images]
[im 3/56  brain]
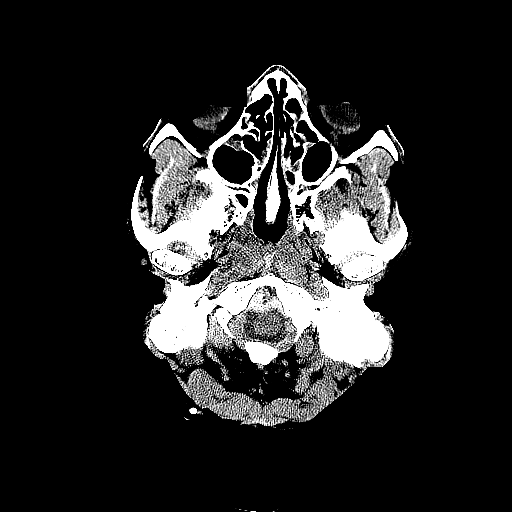
[im 3/56  bone]
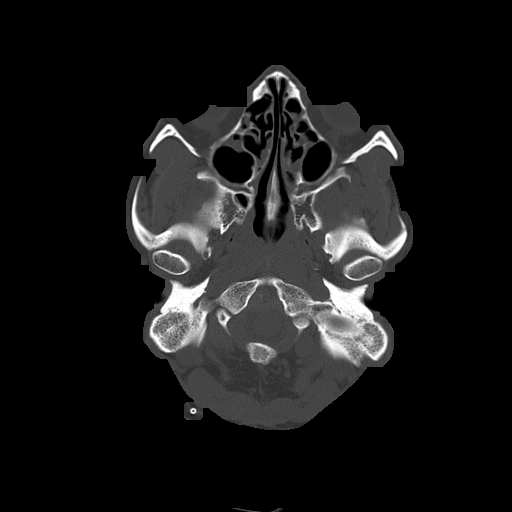
[im 6/56  brain]
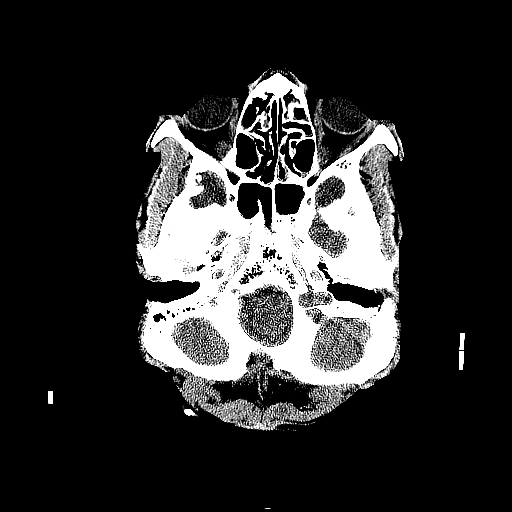
[im 9/56  brain]
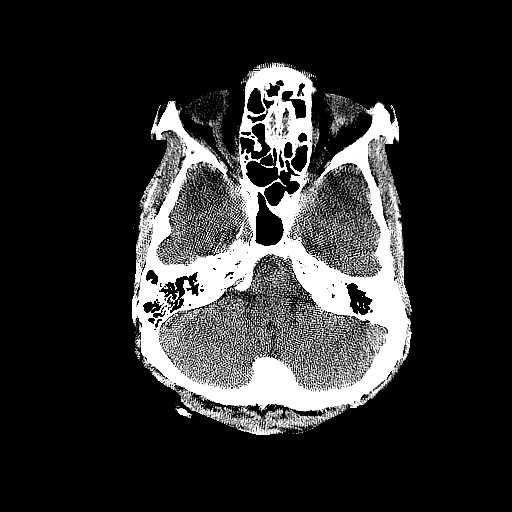
[im 12/56  brain]
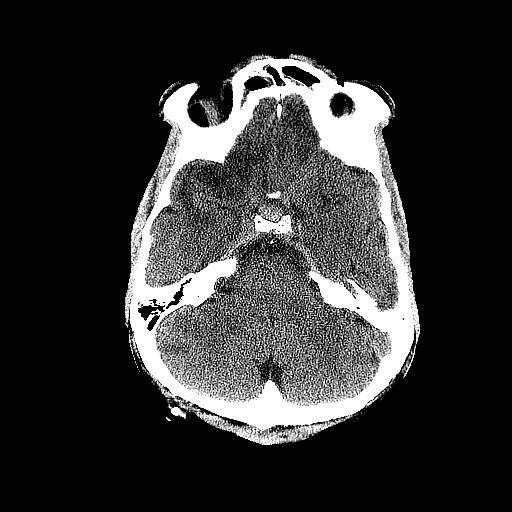
[im 18/56  brain]
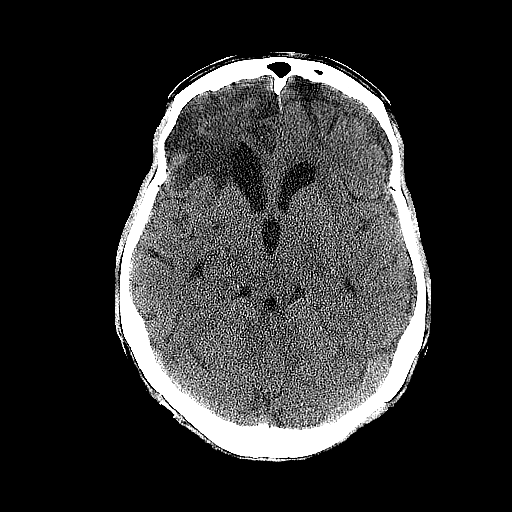
[im 18/56  bone]
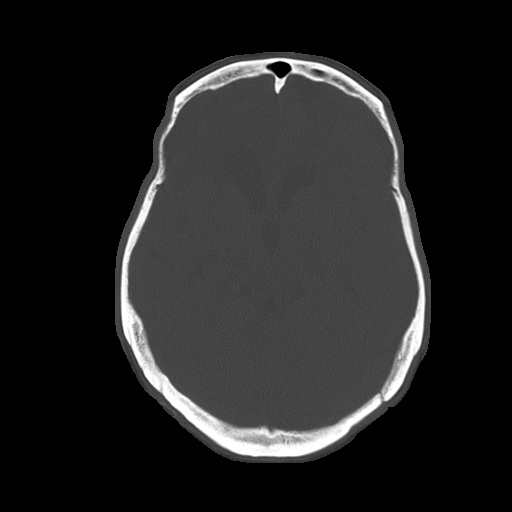
[im 21/56  brain]
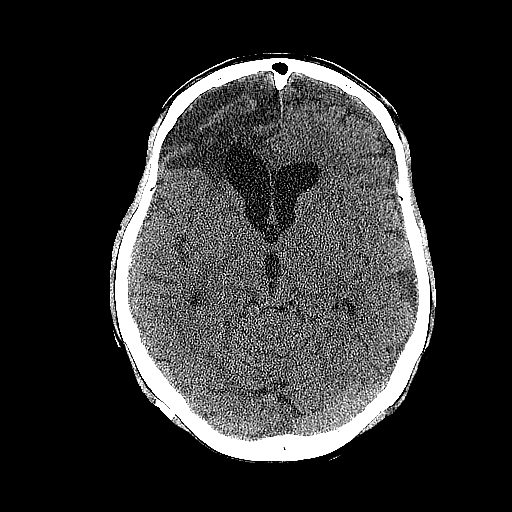
[im 24/56  brain]
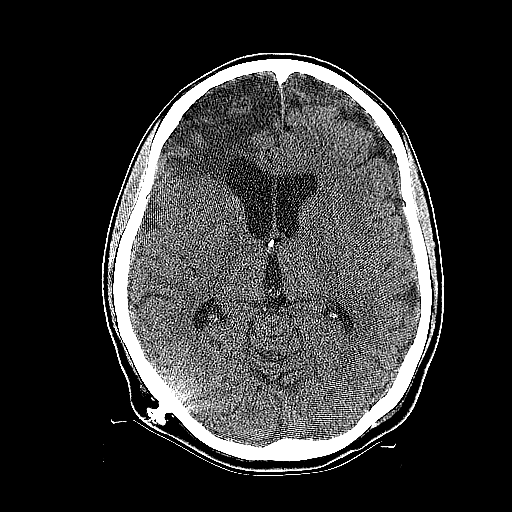
[im 27/56  brain]
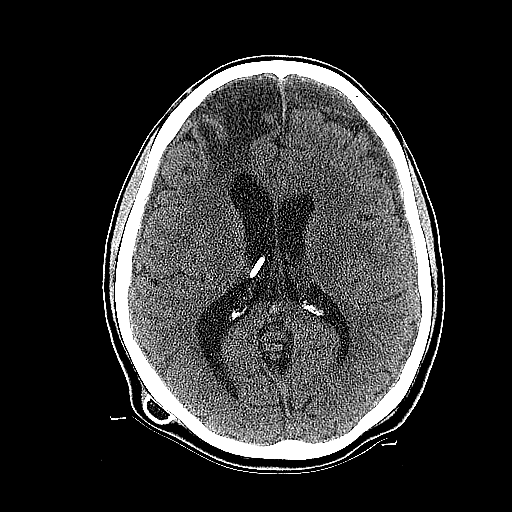
[im 29/56  brain]
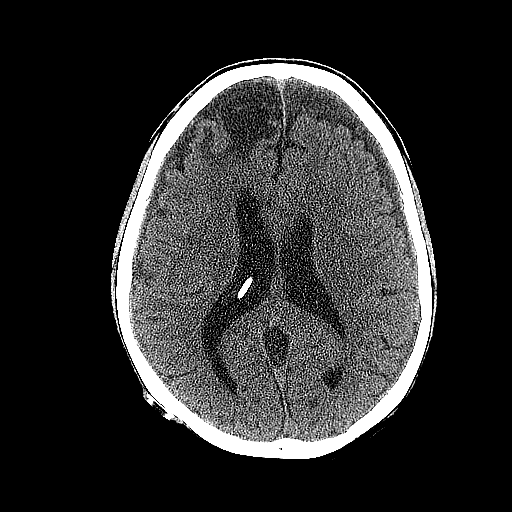
[im 29/56  bone]
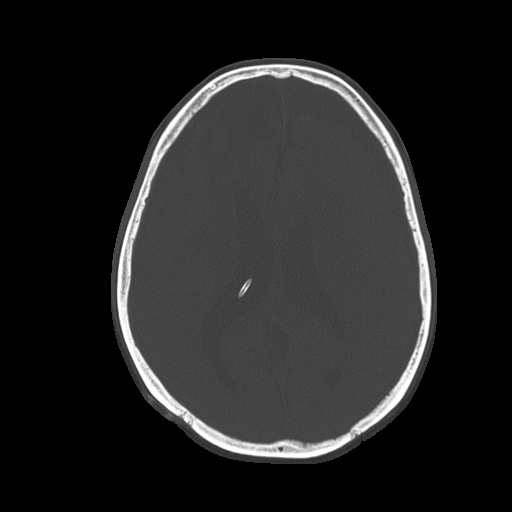
[im 32/56  brain]
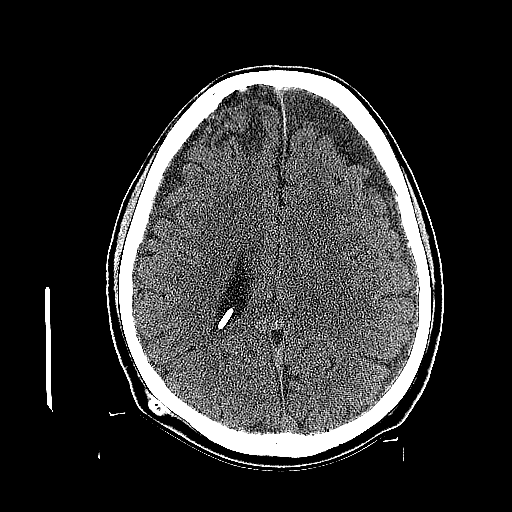
[im 35/56  brain]
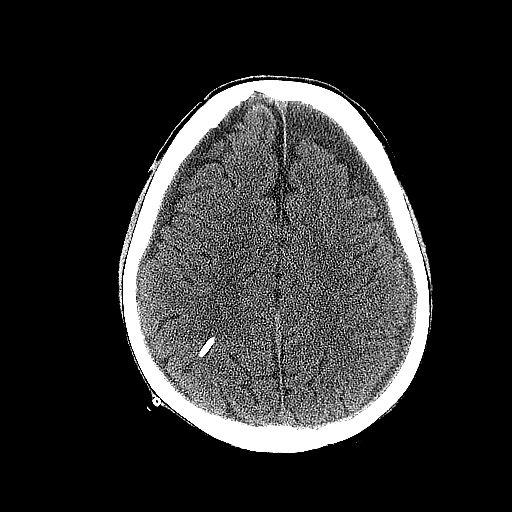
[im 38/56  brain]
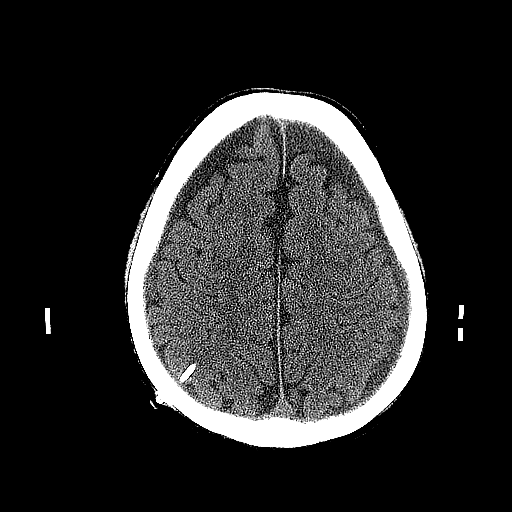
[im 44/56  brain]
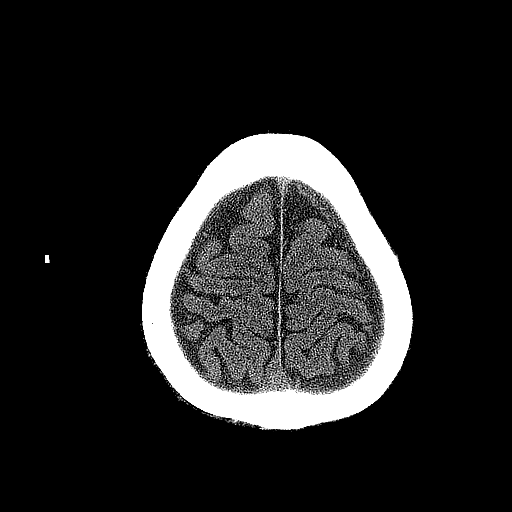
[im 44/56  bone]
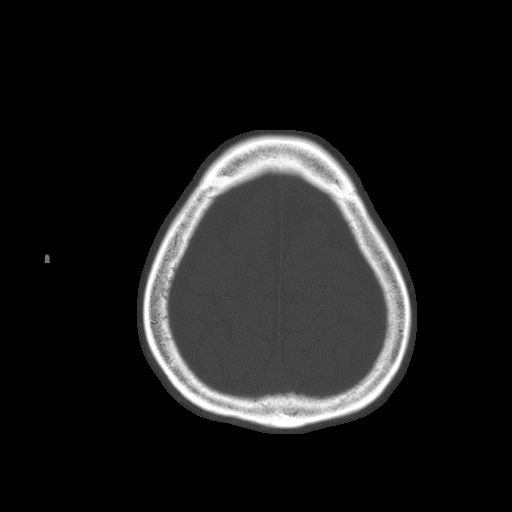
[im 47/56  brain]
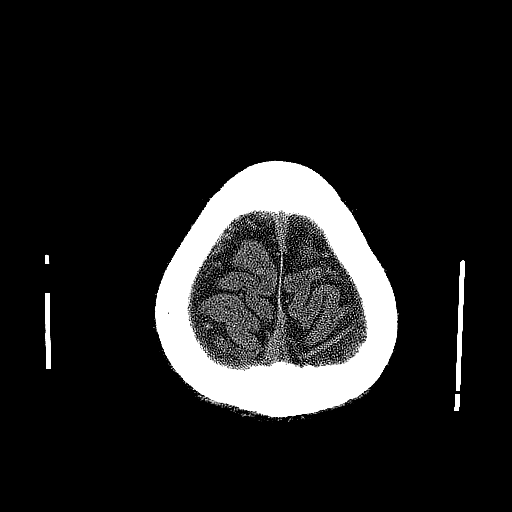
[im 50/56  brain]
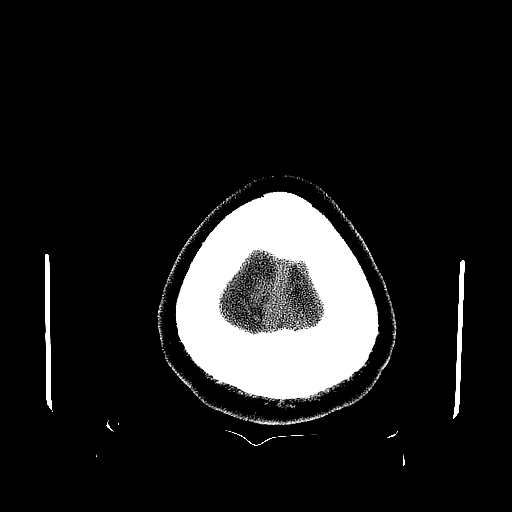
[im 53/56  brain]
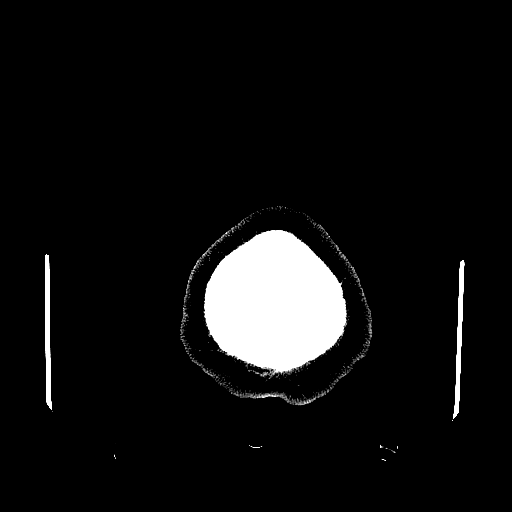

[16 of 30 positions shown; findings below may reference images not displayed]

FINDINGS: There has been interval placement of ventriculostomy shunt via a right parietal approach.  There has been decompression of ventricular size.  Bilateral subdural hygromas are identified and there is encephalomalacic change within the right frontal lobe, stable.  No evidence for acute intracranial abnormality or infarction.  No evidence for mass.
IMPRESSION: 1.  Interval decompression of ventricles.  
2.  Bilateral frontal subdural hygromas.  No acute abnormality.

## 2008-06-10 IMAGING — CT CT HEAD W/O CM
1 series · 15 of 30 positions shown, 19 images · non-contrast
Comparison: 04/22/2007

CLINICAL DATA: Seizure

HEAD CT WITHOUT CONTRAST
TECHNIQUE: 5mm collimated images were obtained from the base of the skull
through the vertex according to standard protocol without contrast.

[Series 3: head routine 4.8 h47s · axial · 0.43mm/px · z∈[-81,+69]mm · 15 of 35 slices shown, 19 images]
[im 2/35  brain]
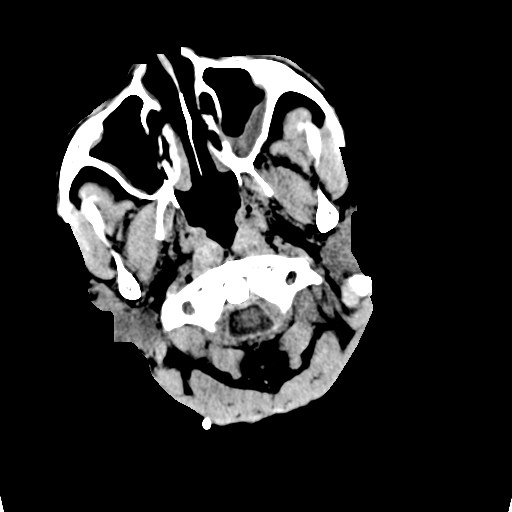
[im 2/35  bone]
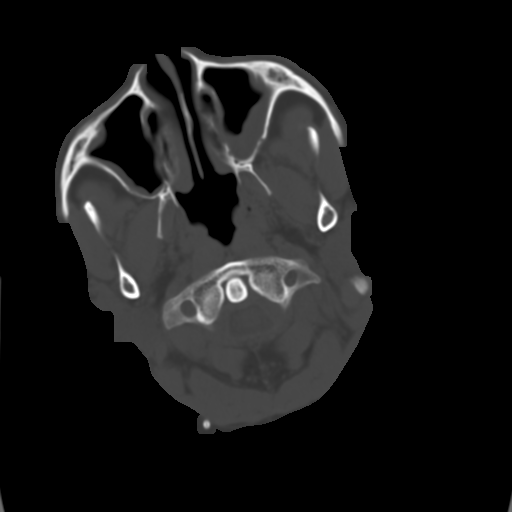
[im 4/35  brain]
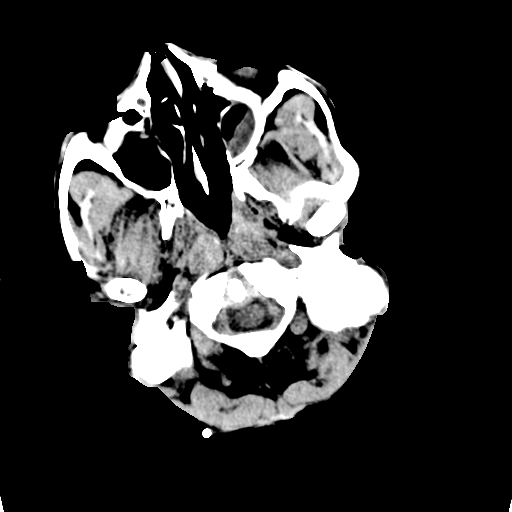
[im 6/35  brain]
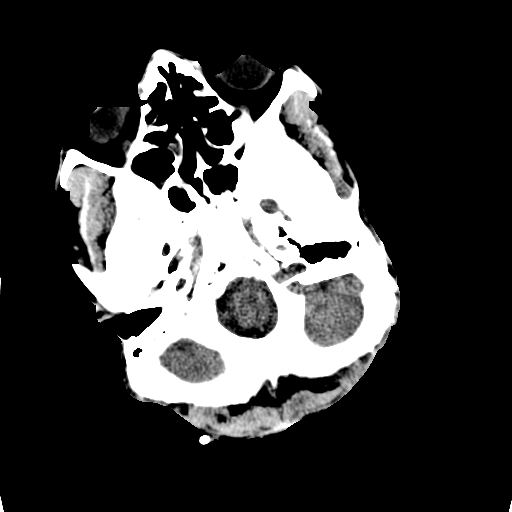
[im 9/35  brain]
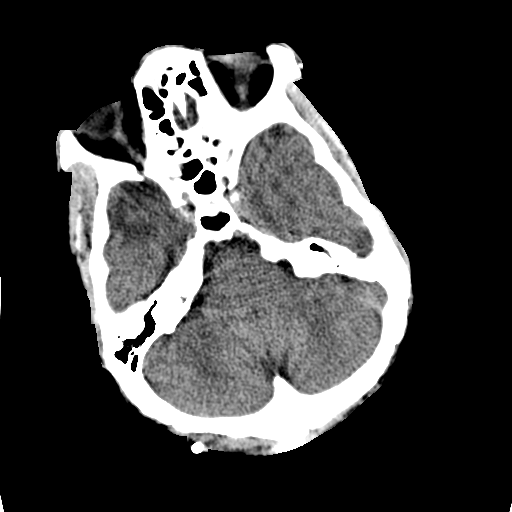
[im 11/35  brain]
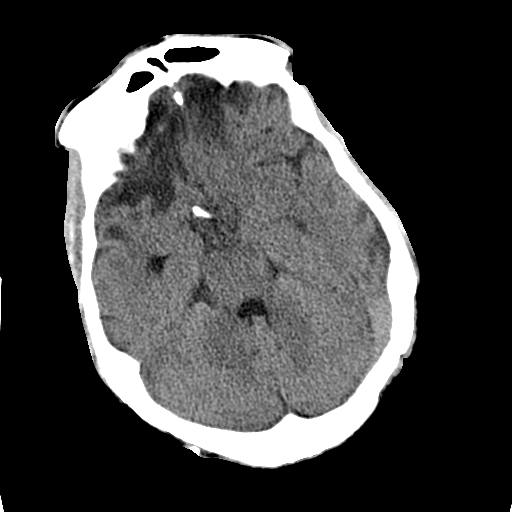
[im 11/35  bone]
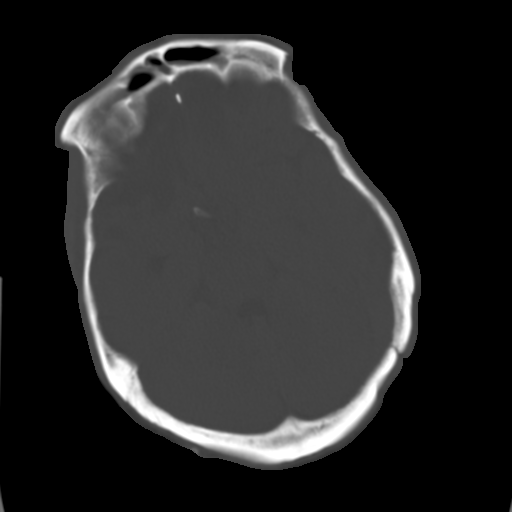
[im 13/35  brain]
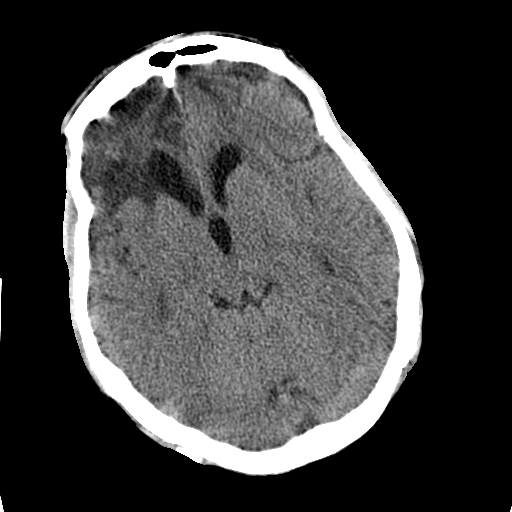
[im 16/35  brain]
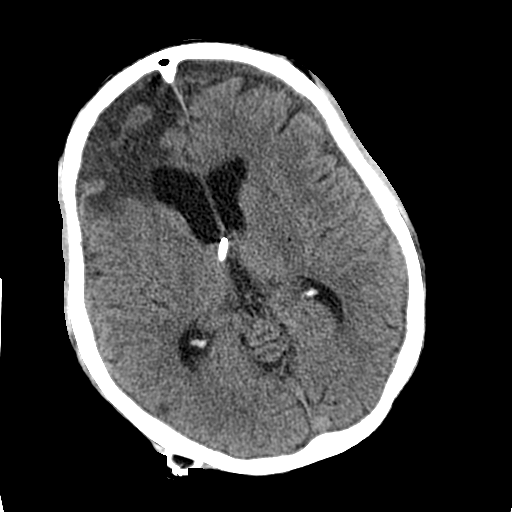
[im 18/35  brain]
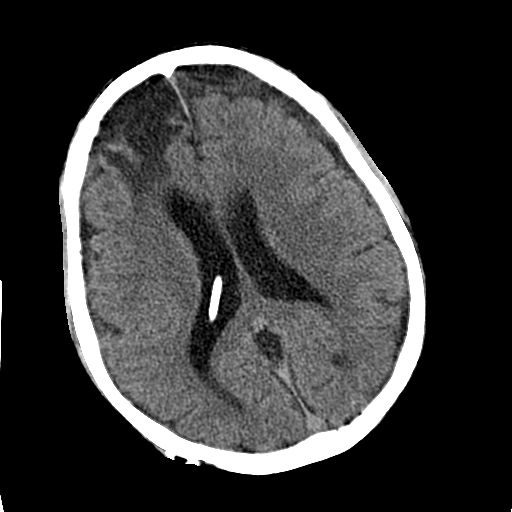
[im 19/35  brain]
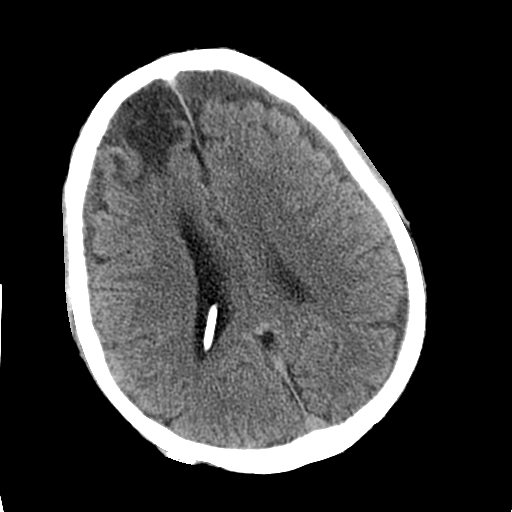
[im 19/35  bone]
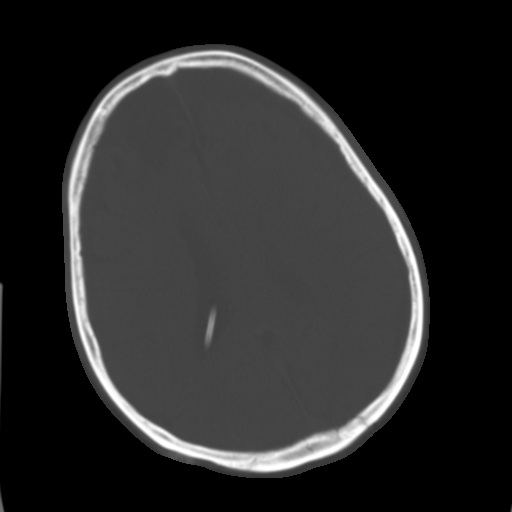
[im 22/35  brain]
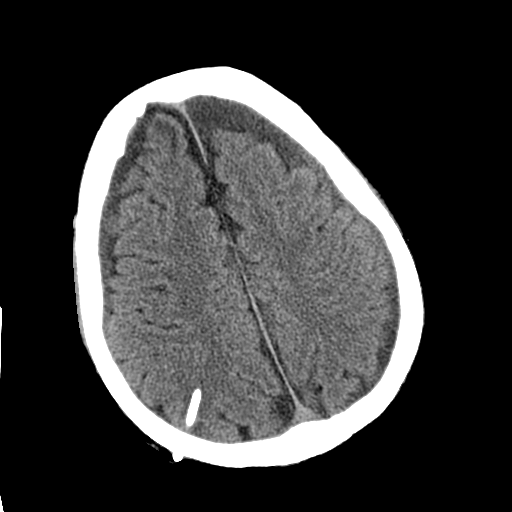
[im 24/35  brain]
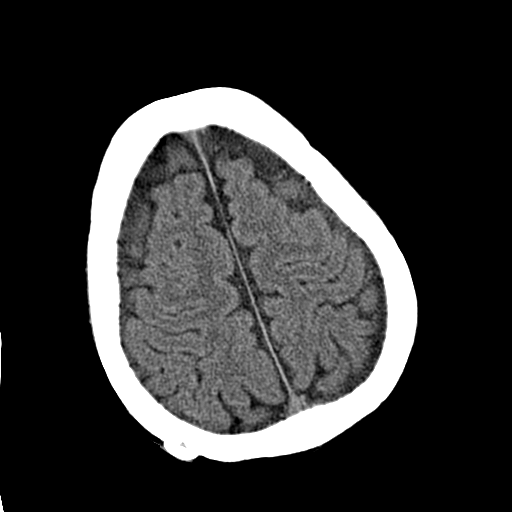
[im 26/35  brain]
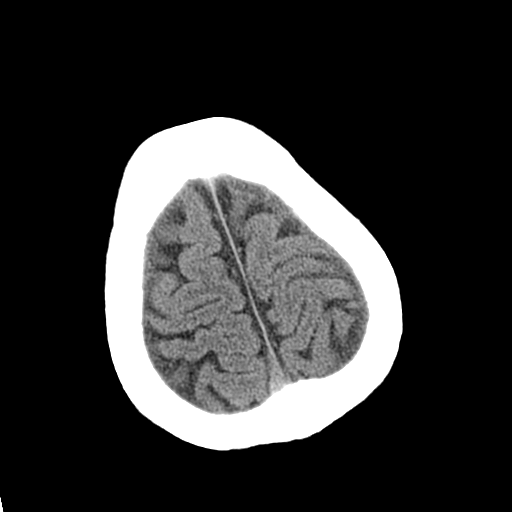
[im 29/35  brain]
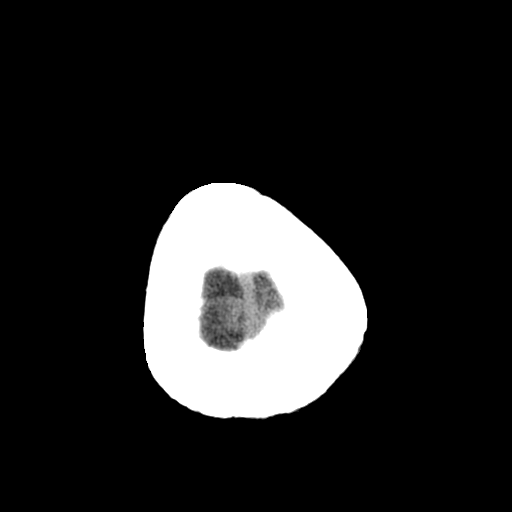
[im 29/35  bone]
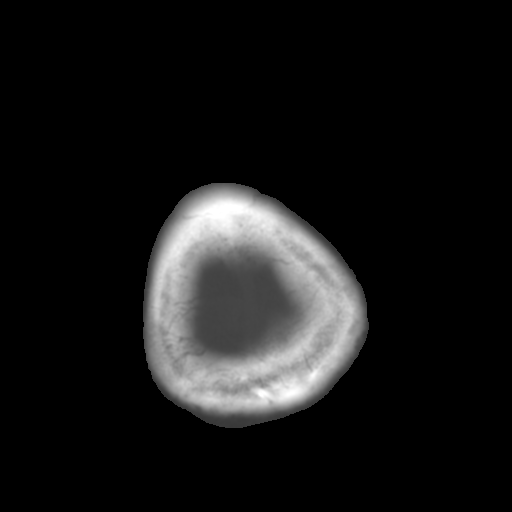
[im 31/35  brain]
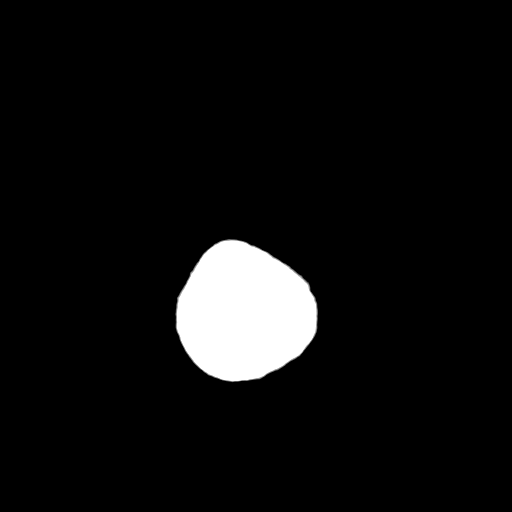
[im 33/35  brain]
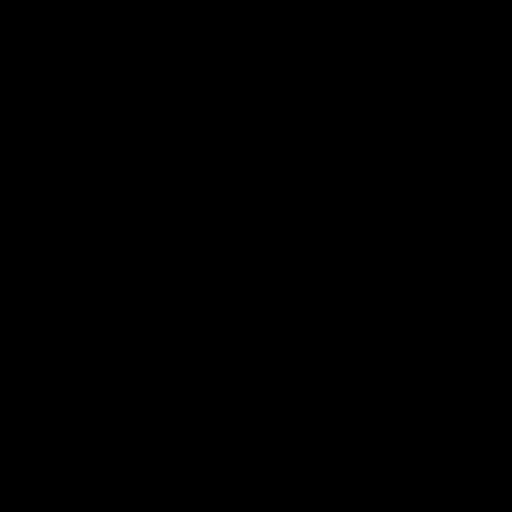

[15 of 30 positions shown; findings below may reference images not displayed]

FINDINGS: There is a right posterior parietal ventriculostomy catheter with tip
in main region of the foramen of [place].  The ventricular volumes are stable
when compared with the prior exam.

Encephalomalacia within the right frontal lobe with ex vacuo dilatation of the
anterior horn of the right lateral ventricle is stable compared to prior exam.

Chronic, bilateral frontal subdural hygromas are unchanged in volume when
compared with the previous study.

There is prominence of the sulci and ventricles consistent with brain atrophy. 

No acute extra-axial fluid collection, intracranial hemorrhage or mass noted.

There is chronic bilateral maxillary ethmoid and frontal sinus inflammation.

IMPRESSION

1. No acute intracranial abnormalities.
2. Stable right parietal ventricular peritoneal shunt tube without complicating
features.
3. Stable bilateral frontal subdural hygromas.
4. Chronic pansinus inflammation.

## 2008-06-11 IMAGING — CR DG CHEST 2V
2 series · 2 of 2 positions shown · non-contrast
Comparison: none

CHEST - 2 VIEW
CLINICAL DATA: Seizure , 2 month history of cough. Comparison:  04/22/2007.

[w chest pa]
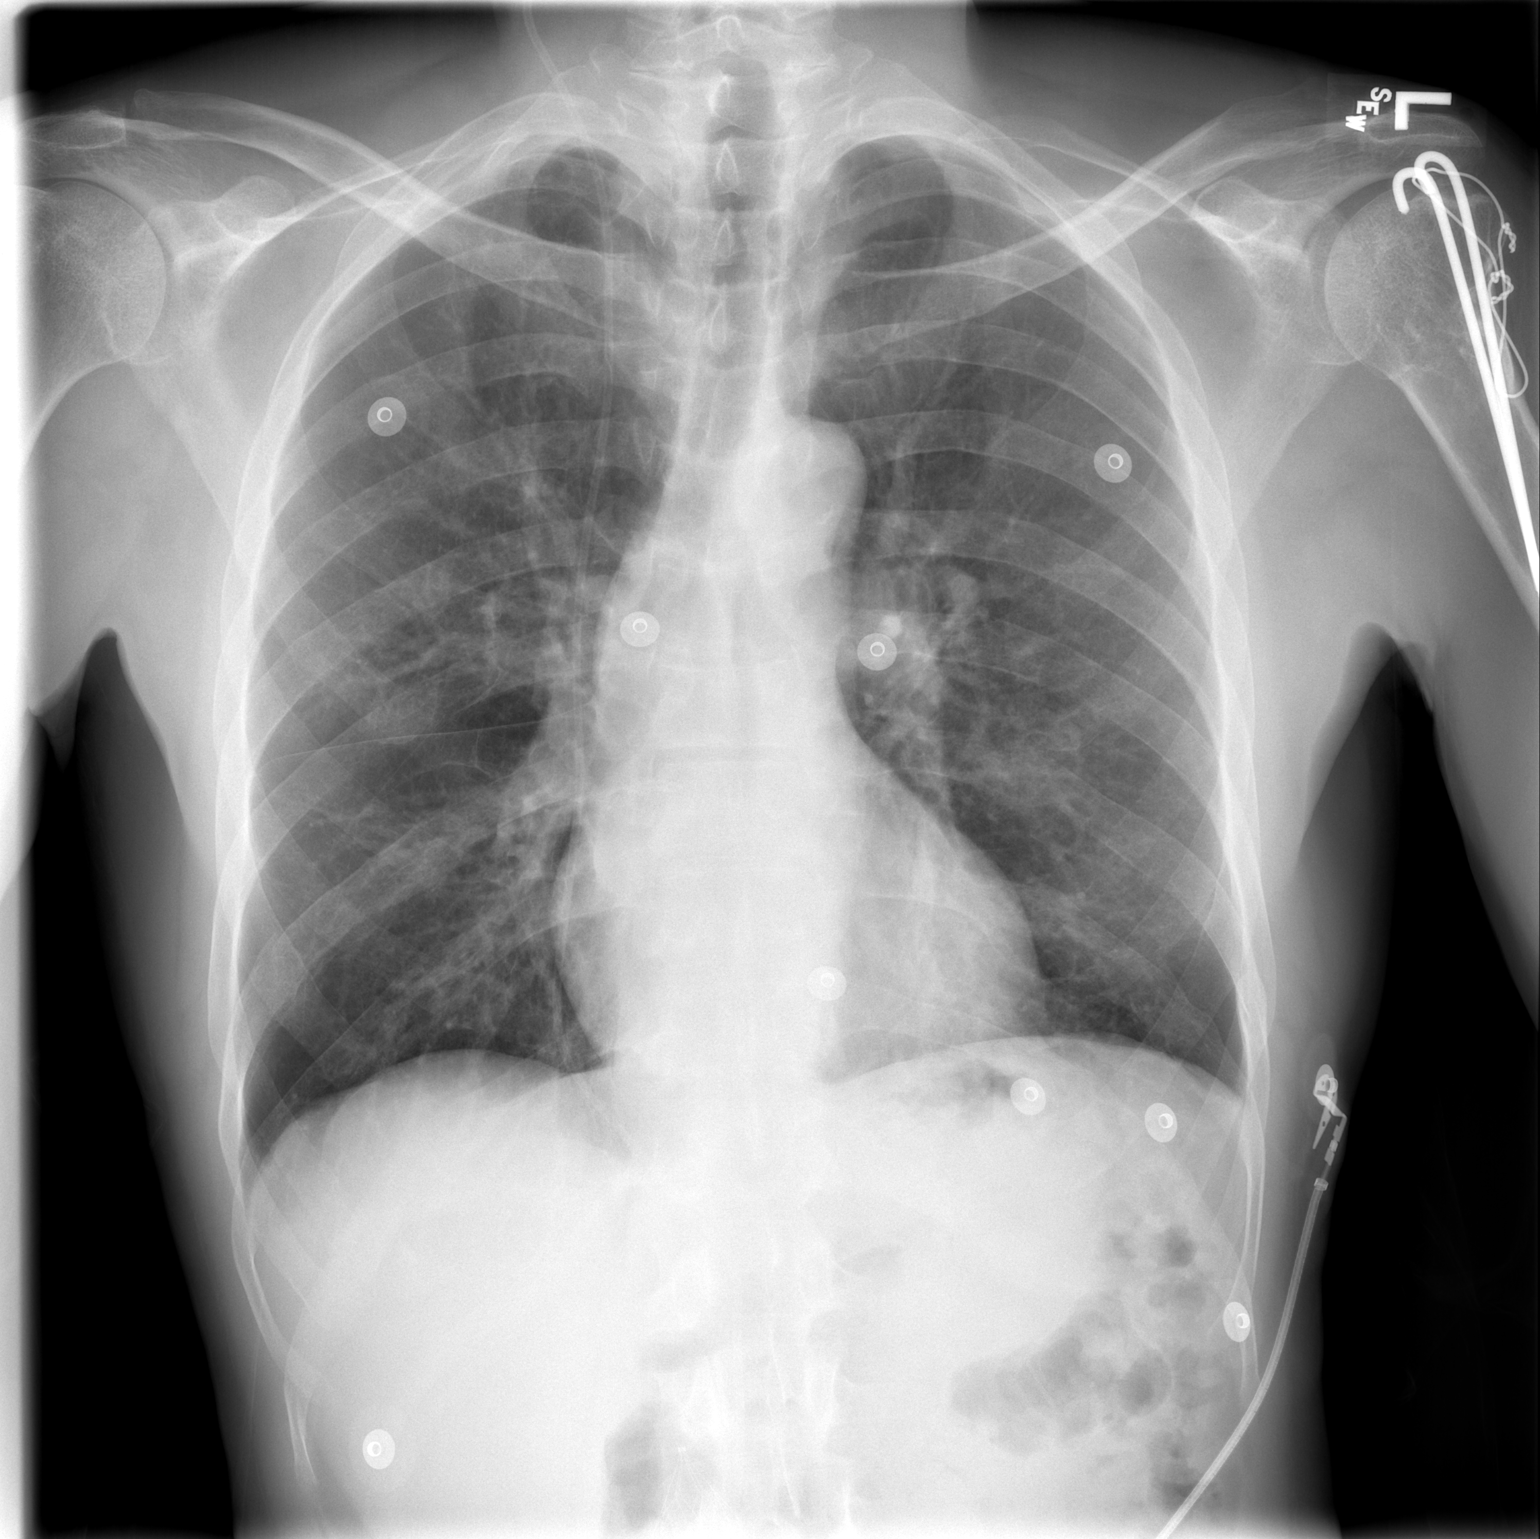

[w chest lat]
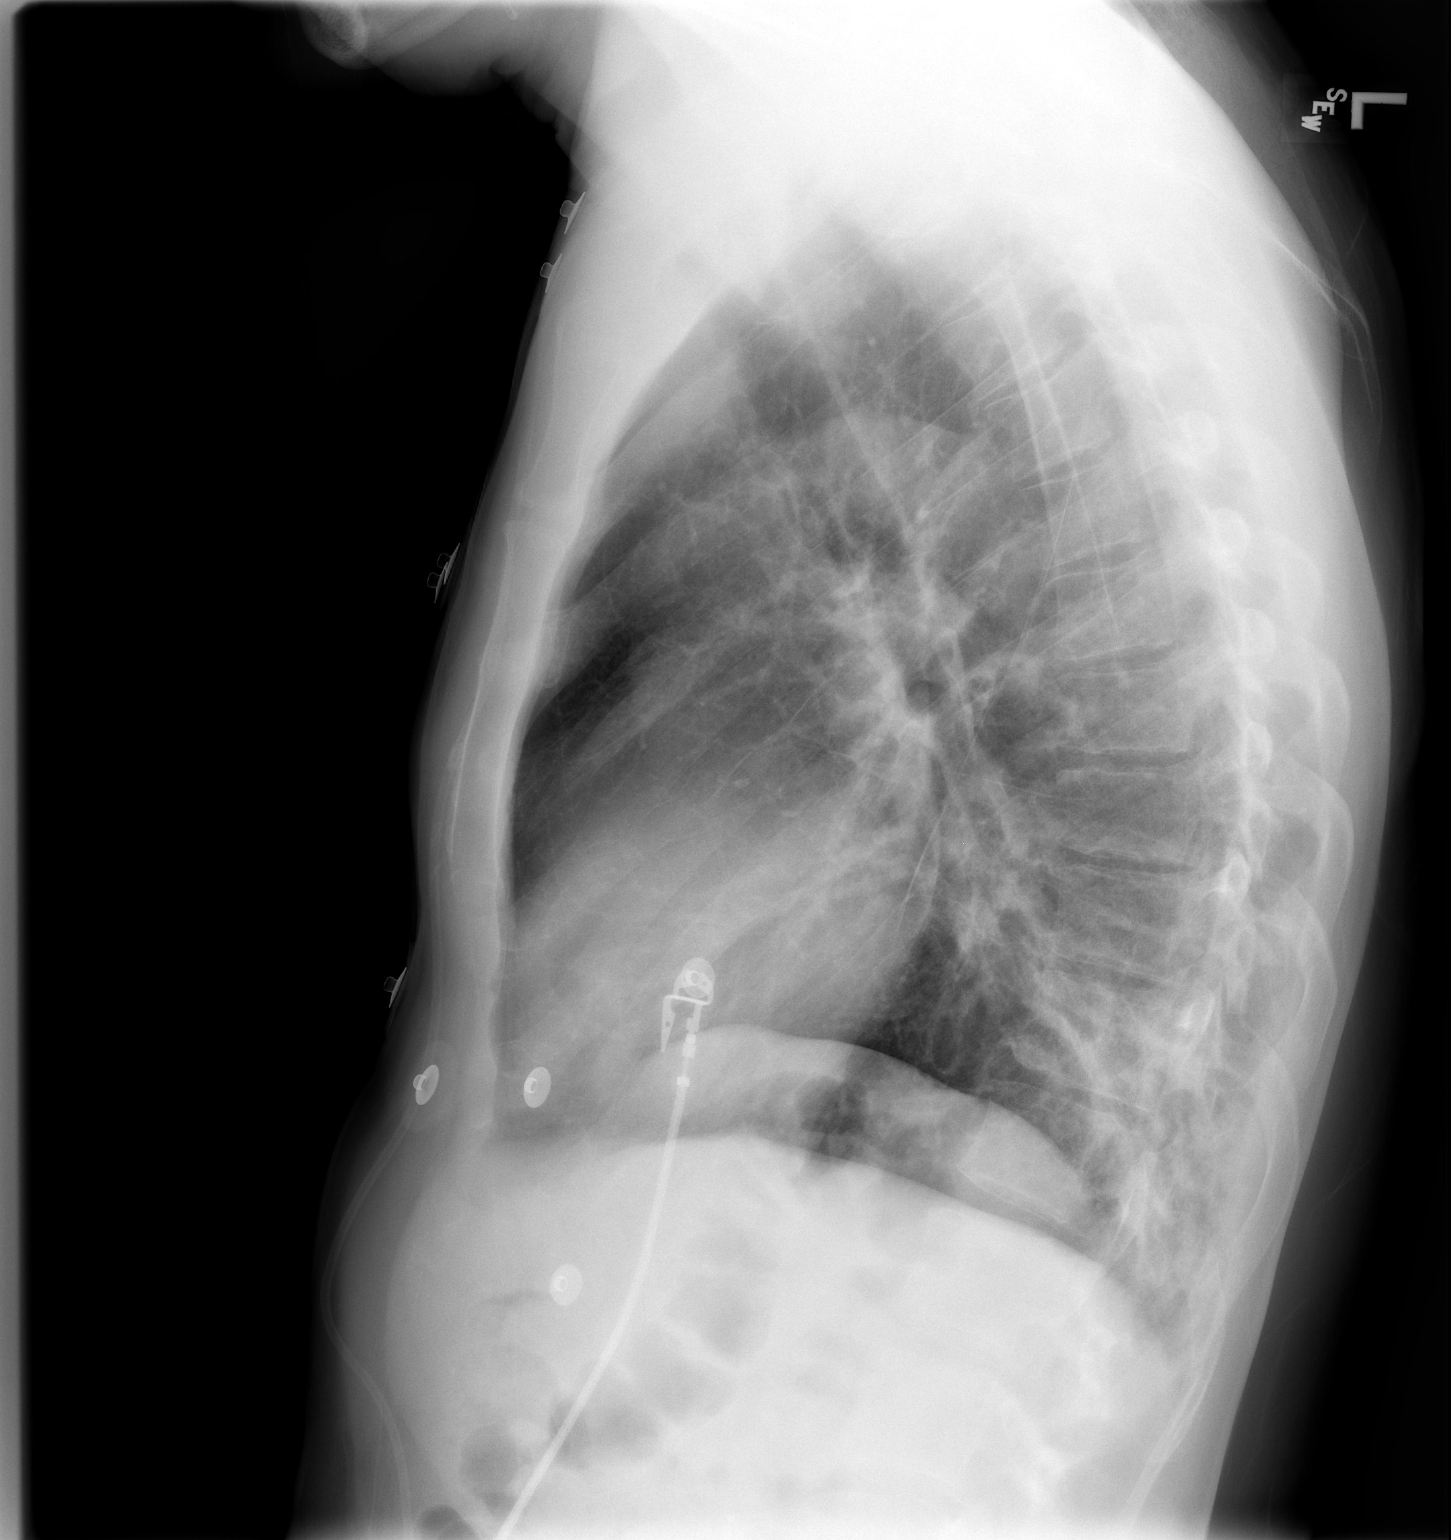

[2 of 2 positions shown; findings below may reference images not displayed]

FINDINGS: Shunt tubing projects over the right hemithorax. Old wire fixation of
the proximal left humerus.

Cardiomediastinal contours appear within normal limits. Increased opacity is
present in the retrocardiac region/left lower lobe, best seen on the lateral
view.

No effusions.
**********************************************************************
IMPRESSION
Left lower lobe atelectasis or airspace disease. 
**********************************************************************

## 2008-09-28 IMAGING — CT CT HEAD W/O CM
1 of 2 series · 13 of 30 positions shown, 17 images · IV contrast (agent unspecified)
Comparison: 05/25/07.

CLINICAL DATA: Altered level of consciousness.  Alcohol use.  Multiple head injuries.
 HEAD CT WITHOUT CONTRAST:
TECHNIQUE: Contiguous axial images were obtained from the base of the skull through the vertex according to standard protocol without contrast.

[Series 2: headseq 4.8 h45s · axial · 0.43mm/px · z∈[+1050,+1197]mm · 13 of 36 slices shown, 17 images]
[im 3/36  brain]
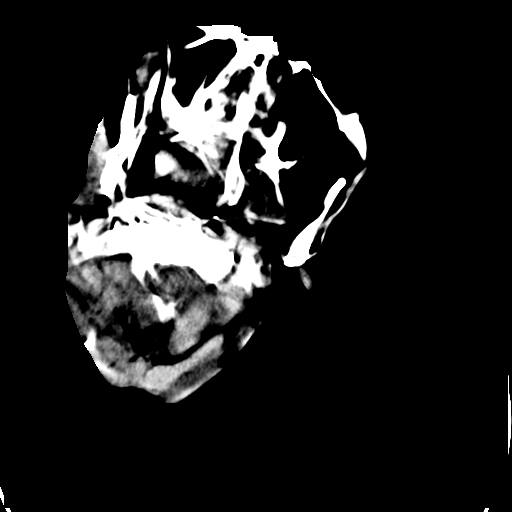
[im 3/36  bone]
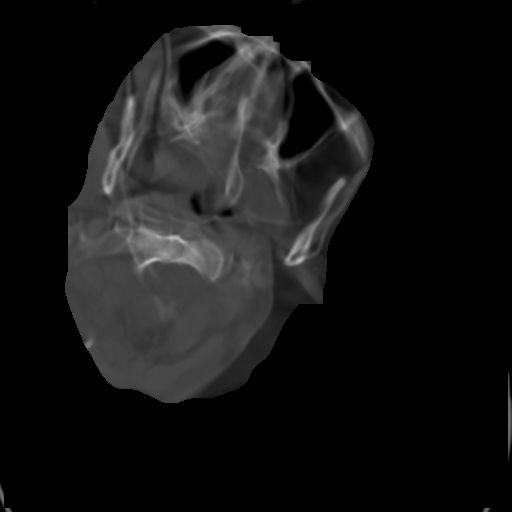
[im 6/36  brain]
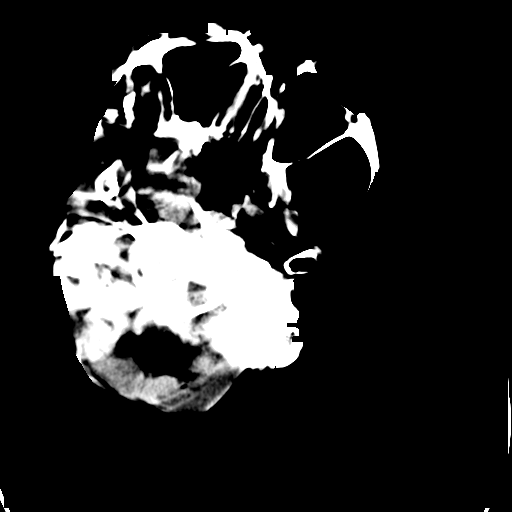
[im 8/36  brain]
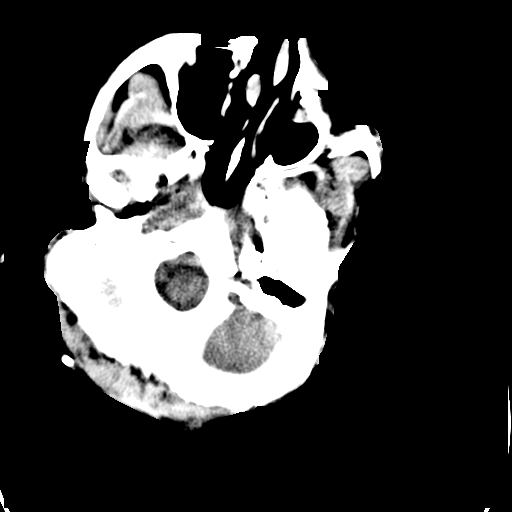
[im 11/36  brain]
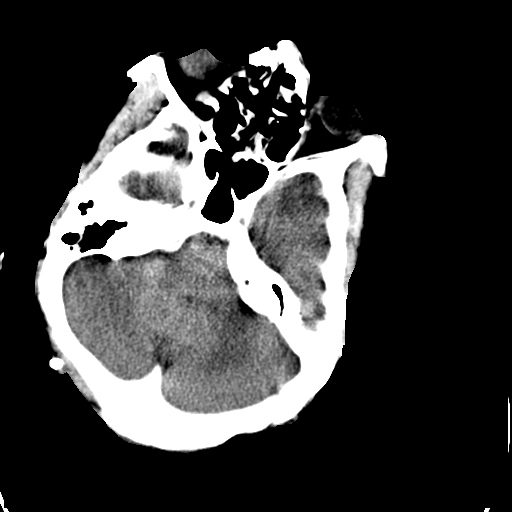
[im 13/36  brain]
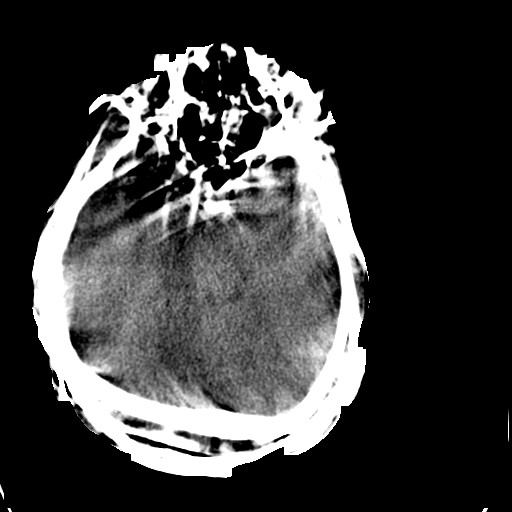
[im 13/36  bone]
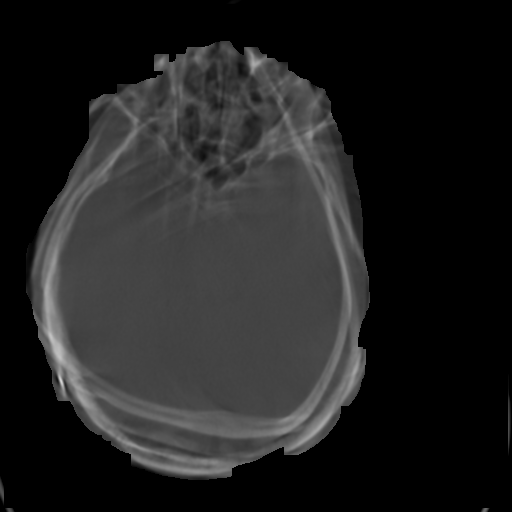
[im 16/36  brain]
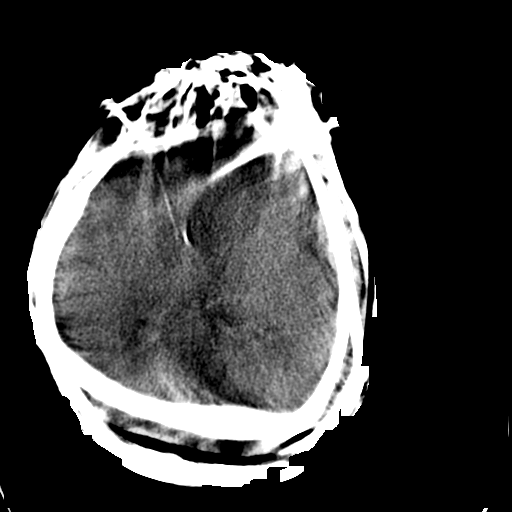
[im 18/36  brain]
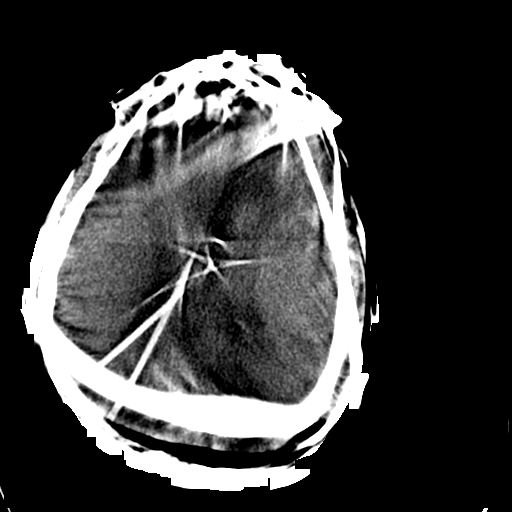
[im 21/36  brain]
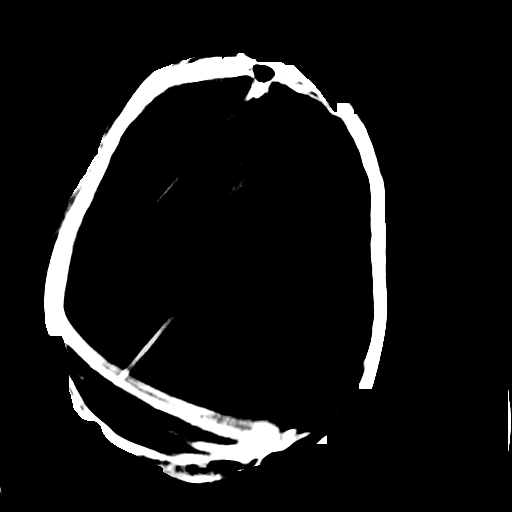
[im 23/36  brain]
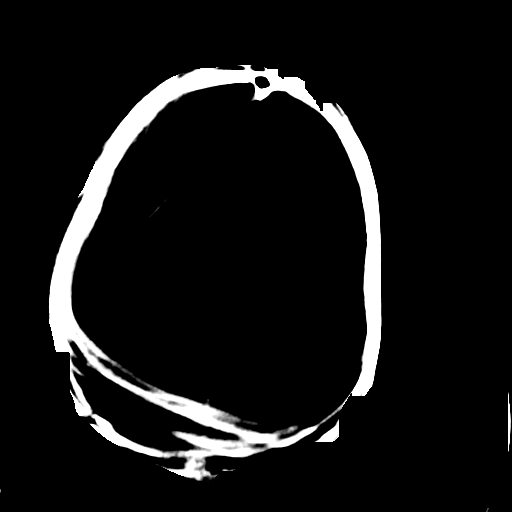
[im 23/36  bone]
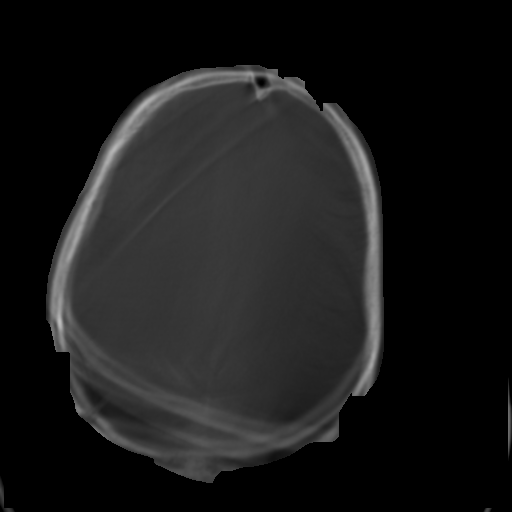
[im 26/36  brain]
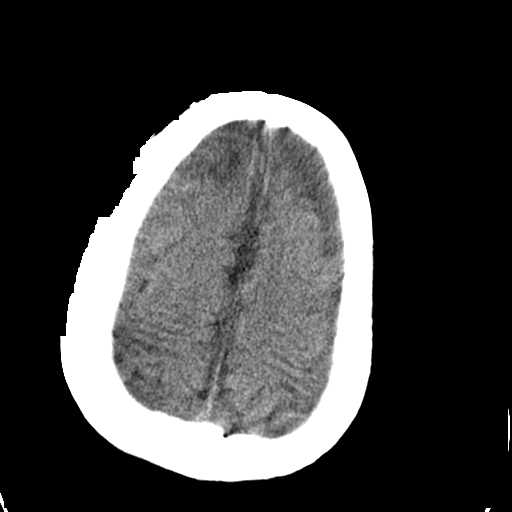
[im 28/36  brain]
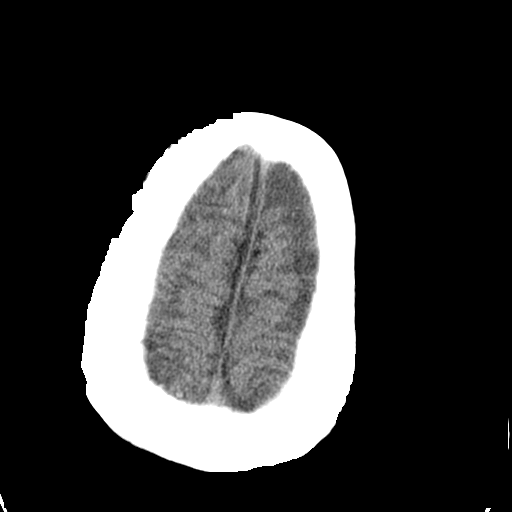
[im 31/36  brain]
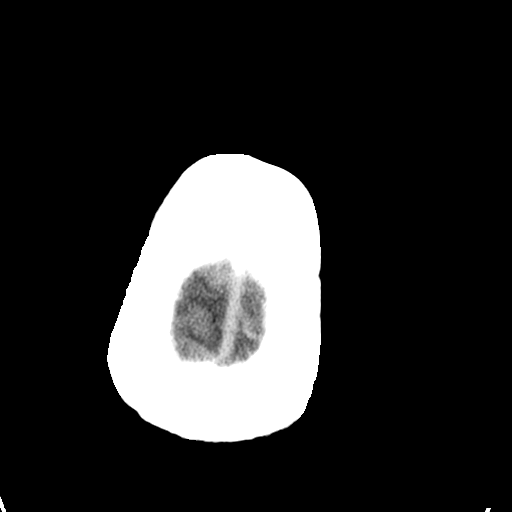
[im 33/36  brain]
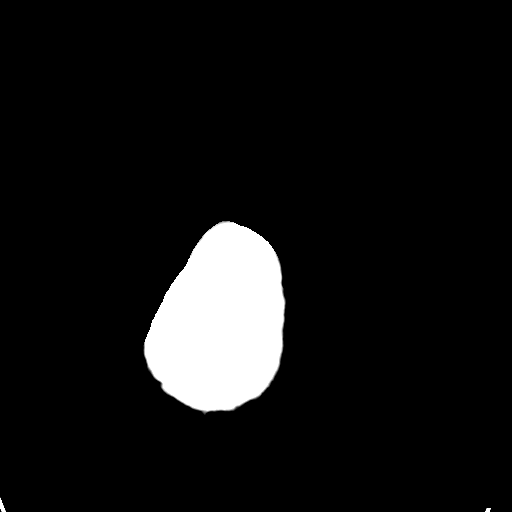
[im 33/36  bone]
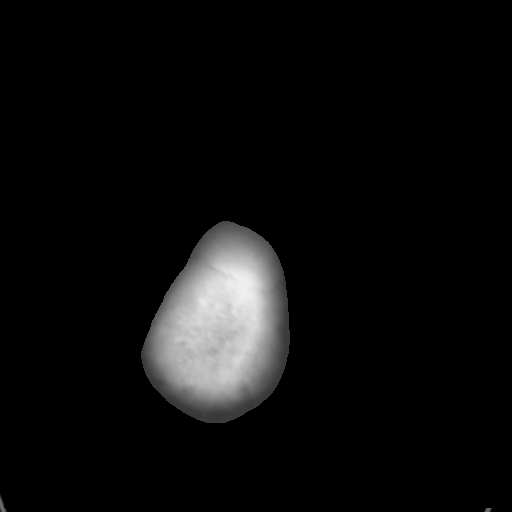

[13 of 30 positions shown; findings below may reference images not displayed]

FINDINGS: The study is degraded by patient motion.  There is ventricular shunt tubing in place from a right parietal approach that enters the right lateral ventricle.  Ventricular size is stable.  Bifrontal atrophy and gliosis appears the same.  No evidence of any acute stroke, mass, hemorrhage or hydrocephalus.  Calvarium is unremarkable.  No fluid in the sinuses.
IMPRESSION: Chronic findings.  No acute lesion.

## 2009-01-03 ENCOUNTER — Ambulatory Visit: Payer: Self-pay | Admitting: Internal Medicine

## 2009-01-03 ENCOUNTER — Inpatient Hospital Stay (HOSPITAL_COMMUNITY): Admission: EM | Admit: 2009-01-03 | Discharge: 2009-01-10 | Payer: Self-pay | Admitting: Emergency Medicine

## 2009-09-26 DIAGNOSIS — I639 Cerebral infarction, unspecified: Secondary | ICD-10-CM

## 2009-09-26 HISTORY — DX: Cerebral infarction, unspecified: I63.9

## 2009-10-14 ENCOUNTER — Inpatient Hospital Stay (HOSPITAL_COMMUNITY): Admission: EM | Admit: 2009-10-14 | Discharge: 2009-10-18 | Payer: Self-pay | Admitting: Emergency Medicine

## 2009-10-16 ENCOUNTER — Encounter (INDEPENDENT_AMBULATORY_CARE_PROVIDER_SITE_OTHER): Payer: Self-pay | Admitting: Internal Medicine

## 2009-10-17 ENCOUNTER — Ambulatory Visit: Payer: Self-pay | Admitting: Physical Medicine & Rehabilitation

## 2009-10-18 ENCOUNTER — Inpatient Hospital Stay (HOSPITAL_COMMUNITY)
Admission: RE | Admit: 2009-10-18 | Discharge: 2009-10-30 | Payer: Self-pay | Admitting: Physical Medicine & Rehabilitation

## 2009-10-19 ENCOUNTER — Ambulatory Visit: Payer: Self-pay | Admitting: Physical Medicine & Rehabilitation

## 2009-11-27 ENCOUNTER — Encounter
Admission: RE | Admit: 2009-11-27 | Discharge: 2009-11-27 | Payer: Self-pay | Admitting: Physical Medicine & Rehabilitation

## 2010-01-20 IMAGING — CR DG CHEST 1V PORT
1 series · 1 of 1 positions shown · non-contrast
Comparison: Portable exam 2222 hours compared to 05/26/2007

CLINICAL DATA: Seizure, status epilepticus, intubation

PORTABLE CHEST - 1 VIEW

[view not recorded]
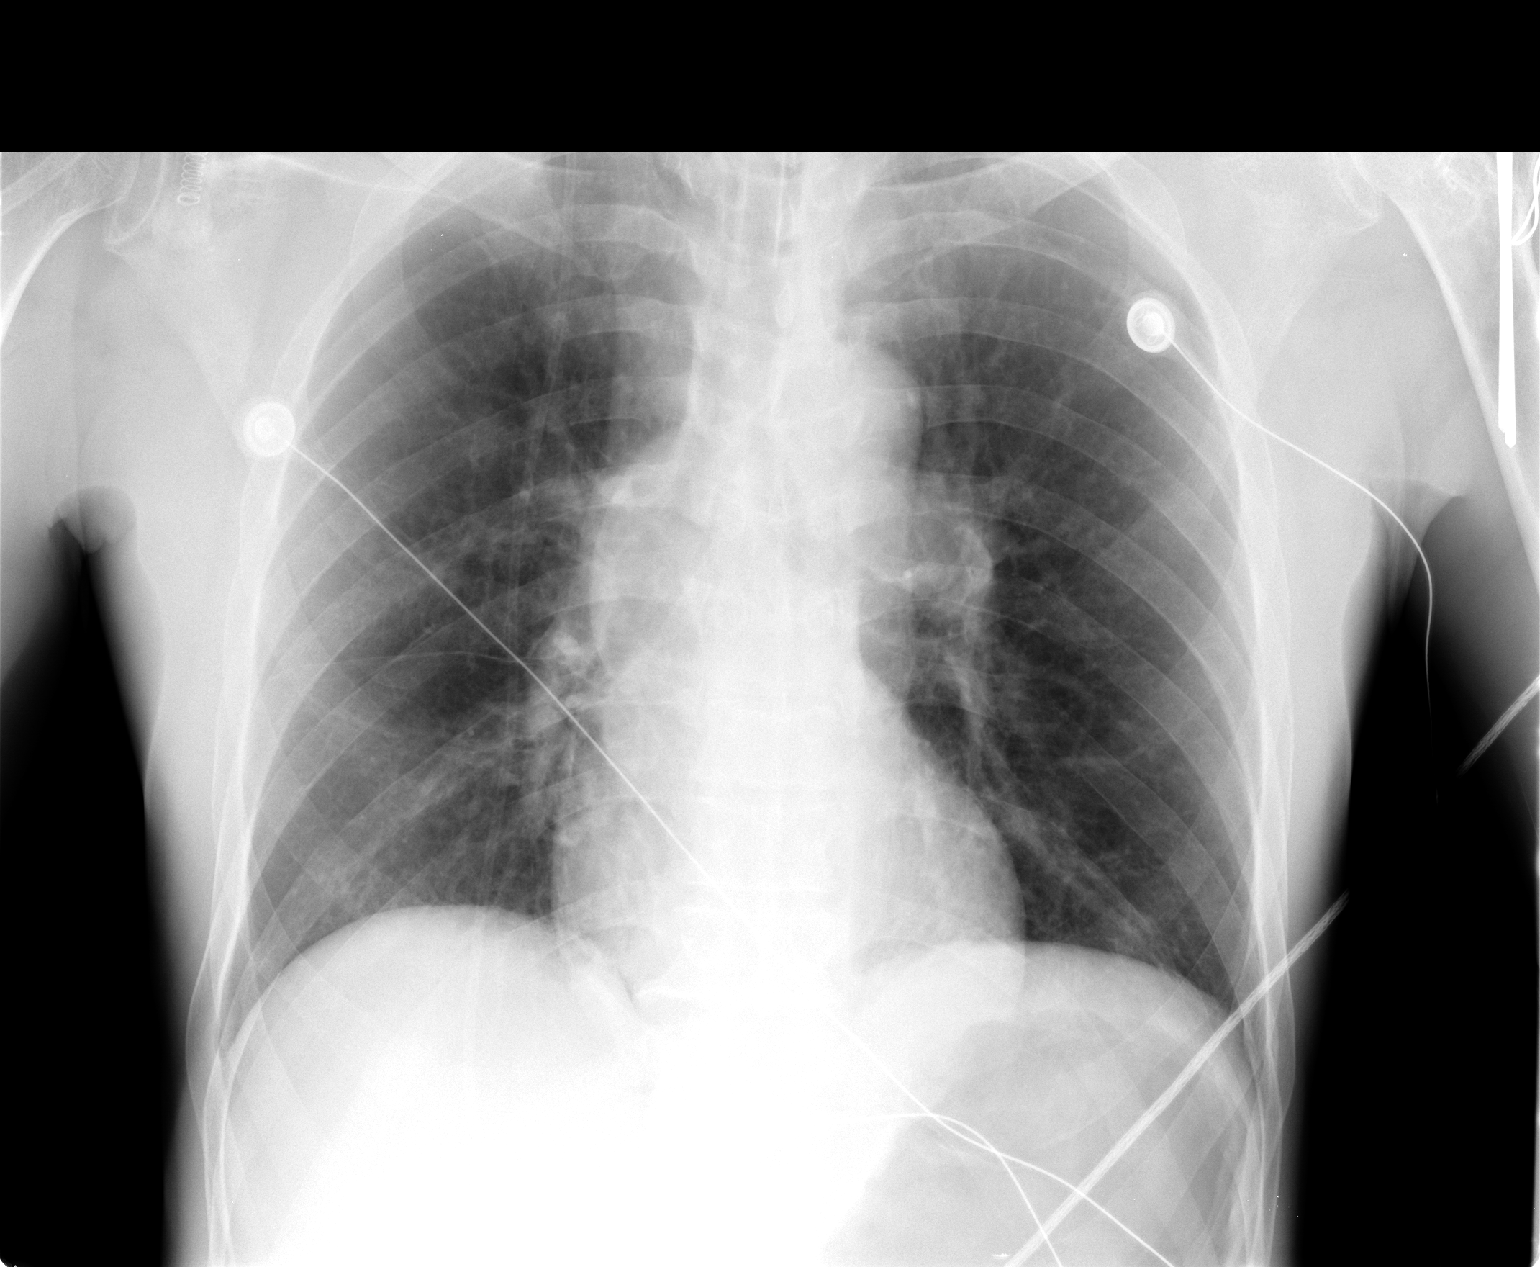

[1 of 1 positions shown; findings below may reference images not displayed]

FINDINGS: Tip of endotracheal tube 6.5 cm above carina.
Normal heart size, mediastinal contours, and pulmonary vascularity.
VP shunt tubing traverses right hemithorax.
No pulmonary infiltrate or pleural effusion.
Mildly hyperexpanded lungs.
Minimal chronic increase in markings in medial left lung base
unchanged.
Questionable nodular density versus summation artifact in right
upper lobe.
IMPRESSION: Satisfactory endotracheal tube position.
Hyperexpanded lungs without definite acute process.
Questionable nodular density versus summation artifact in right
upper lobe, can reassess on follow-up exams.

## 2010-01-20 IMAGING — CR DG CHEST 1V PORT
1 series · 1 of 1 positions shown · non-contrast
Comparison: Portable exam 9859 hours compared to 01/03/2009 at 5775
hours.

CLINICAL DATA: Placement of NG tube

PORTABLE CHEST - 1 VIEW

[view not recorded]
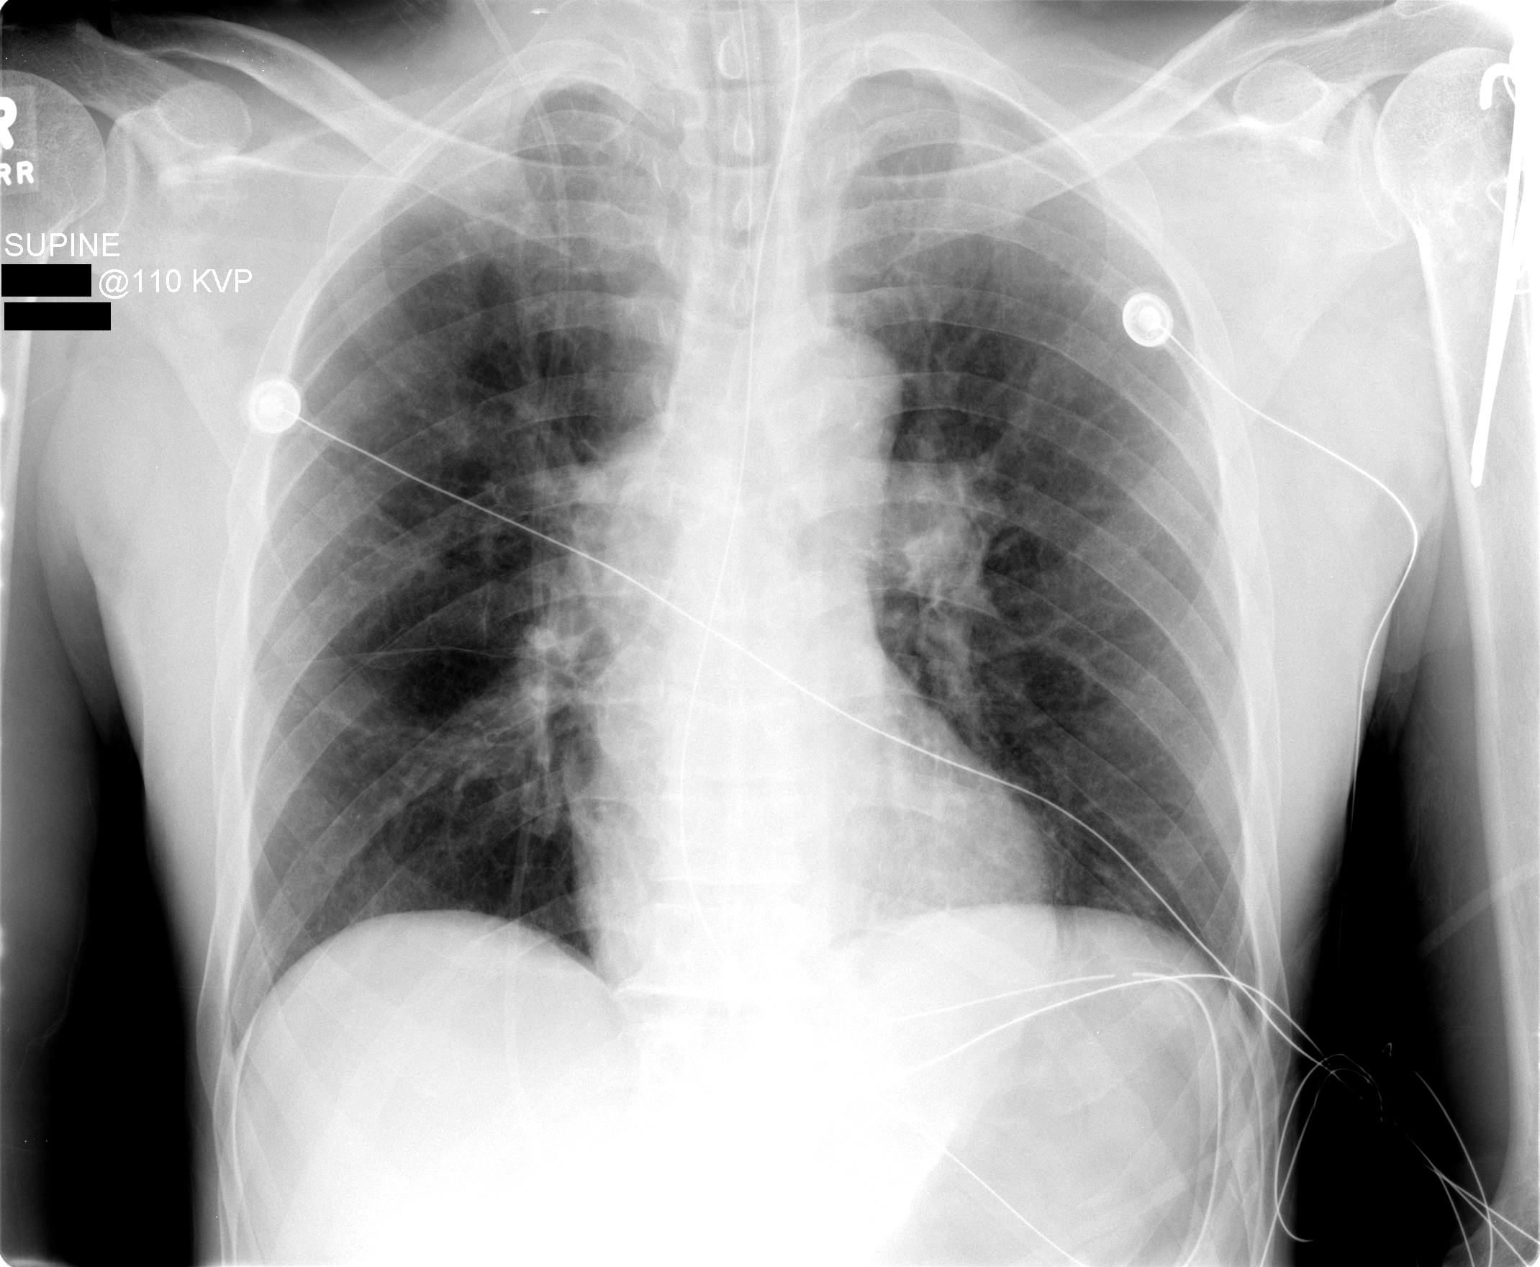

[1 of 1 positions shown; findings below may reference images not displayed]

FINDINGS: Tip of endotracheal tube 5.5 cm above carina.
Nasogastric tube coiled in stomach.
Stable heart size and mediastinal contours.
Left hilum appears slightly more prominent in size than on previous
study, suspect projectional.
No gross pulmonary infiltrate or pleural effusion.
Mild bronchitic changes.
VP shunt tubing traverses right thorax.
IMPRESSION: Minimal bronchitic changes.
No acute abnormalities.

## 2010-01-20 IMAGING — CR DG CHEST 1V PORT
1 series · 1 of 1 positions shown · non-contrast
Comparison: Chest [DATE]/0979 9269 hours.

CLINICAL DATA: Line placement.

PORTABLE CHEST - 1 VIEW

[view not recorded]
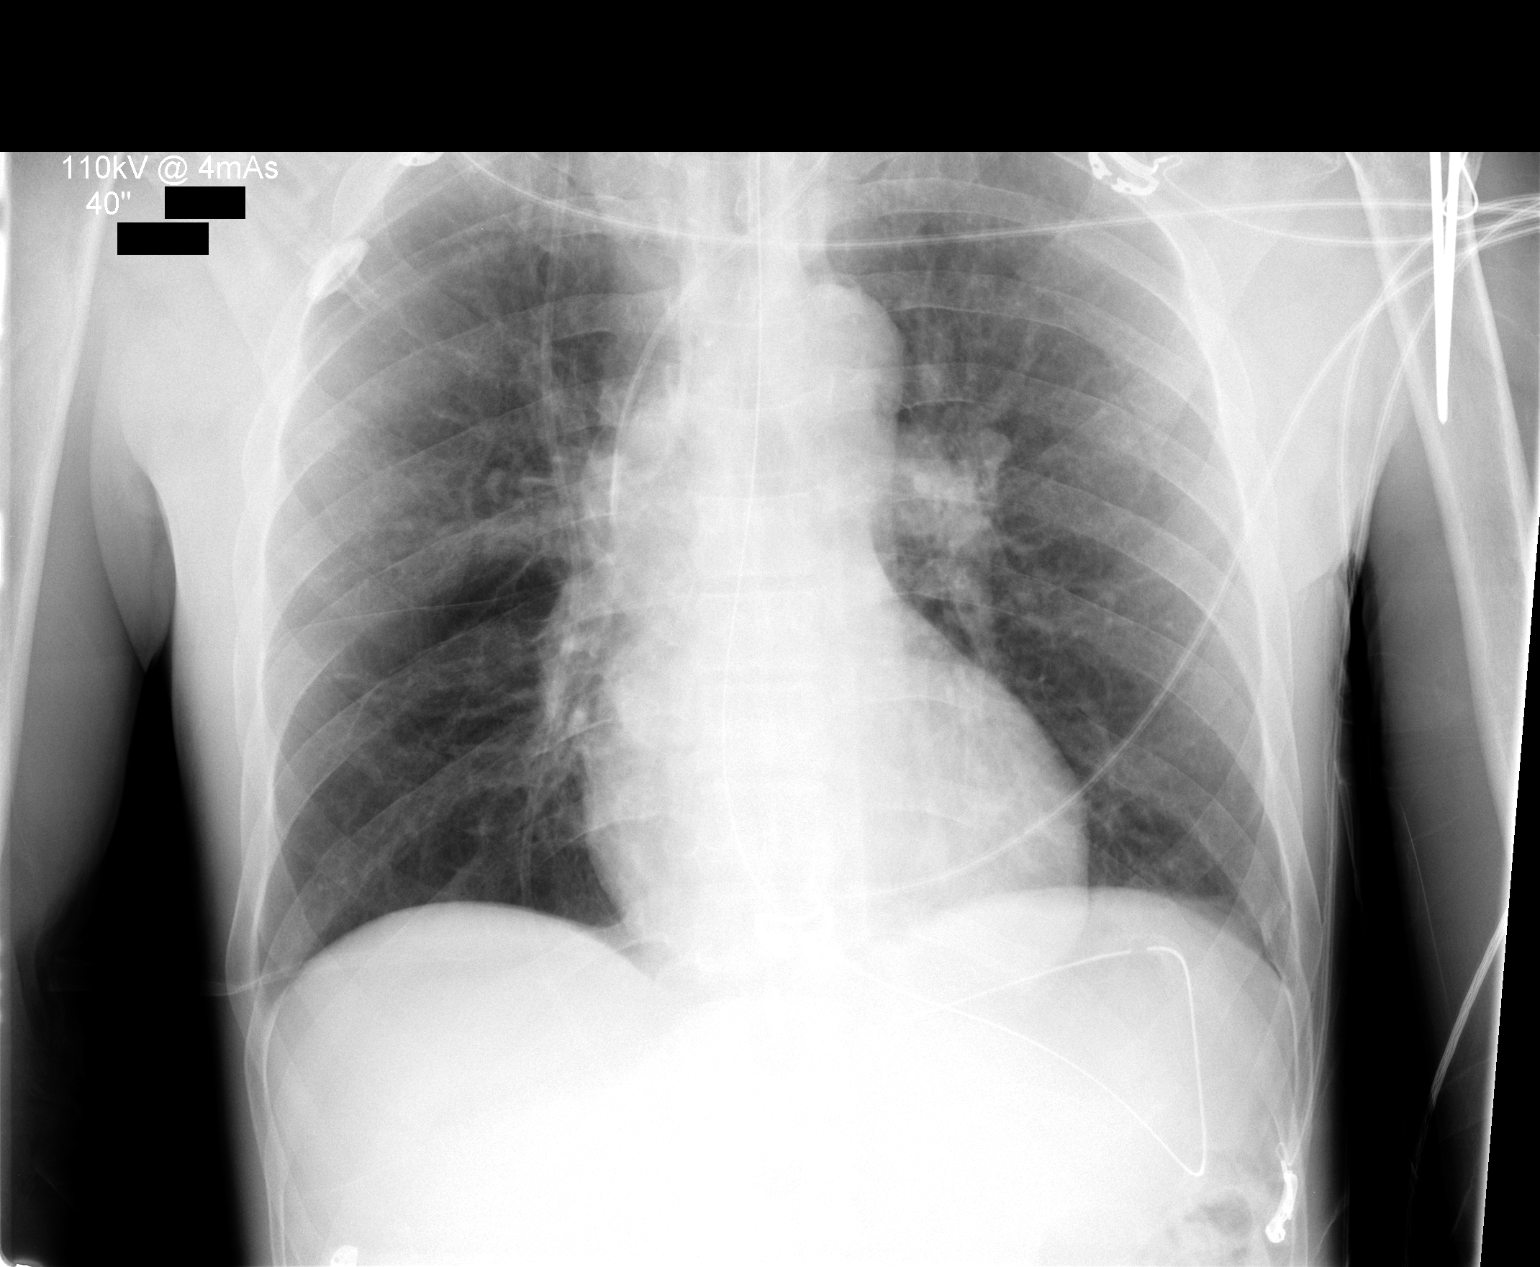

[1 of 1 positions shown; findings below may reference images not displayed]

FINDINGS: The patient has a new left IJ approach central venous
catheter with the tip in the superior vena cava.  There is no
pneumothorax.  Support apparatus is otherwise unchanged.  Lungs are
clear.  Heart size normal.  Remote appearing lower left rib
fracture again noted.
IMPRESSION: Left IJ catheter in good position.  No pneumothorax or other
change.

## 2010-01-21 IMAGING — CR DG CHEST 1V PORT
1 series · 1 of 1 positions shown · non-contrast
Comparison: Portable exam 4474 hours compared to 01/03/2009

CLINICAL DATA: Status epilepticus, intubation

PORTABLE CHEST - 1 VIEW

[view not recorded]
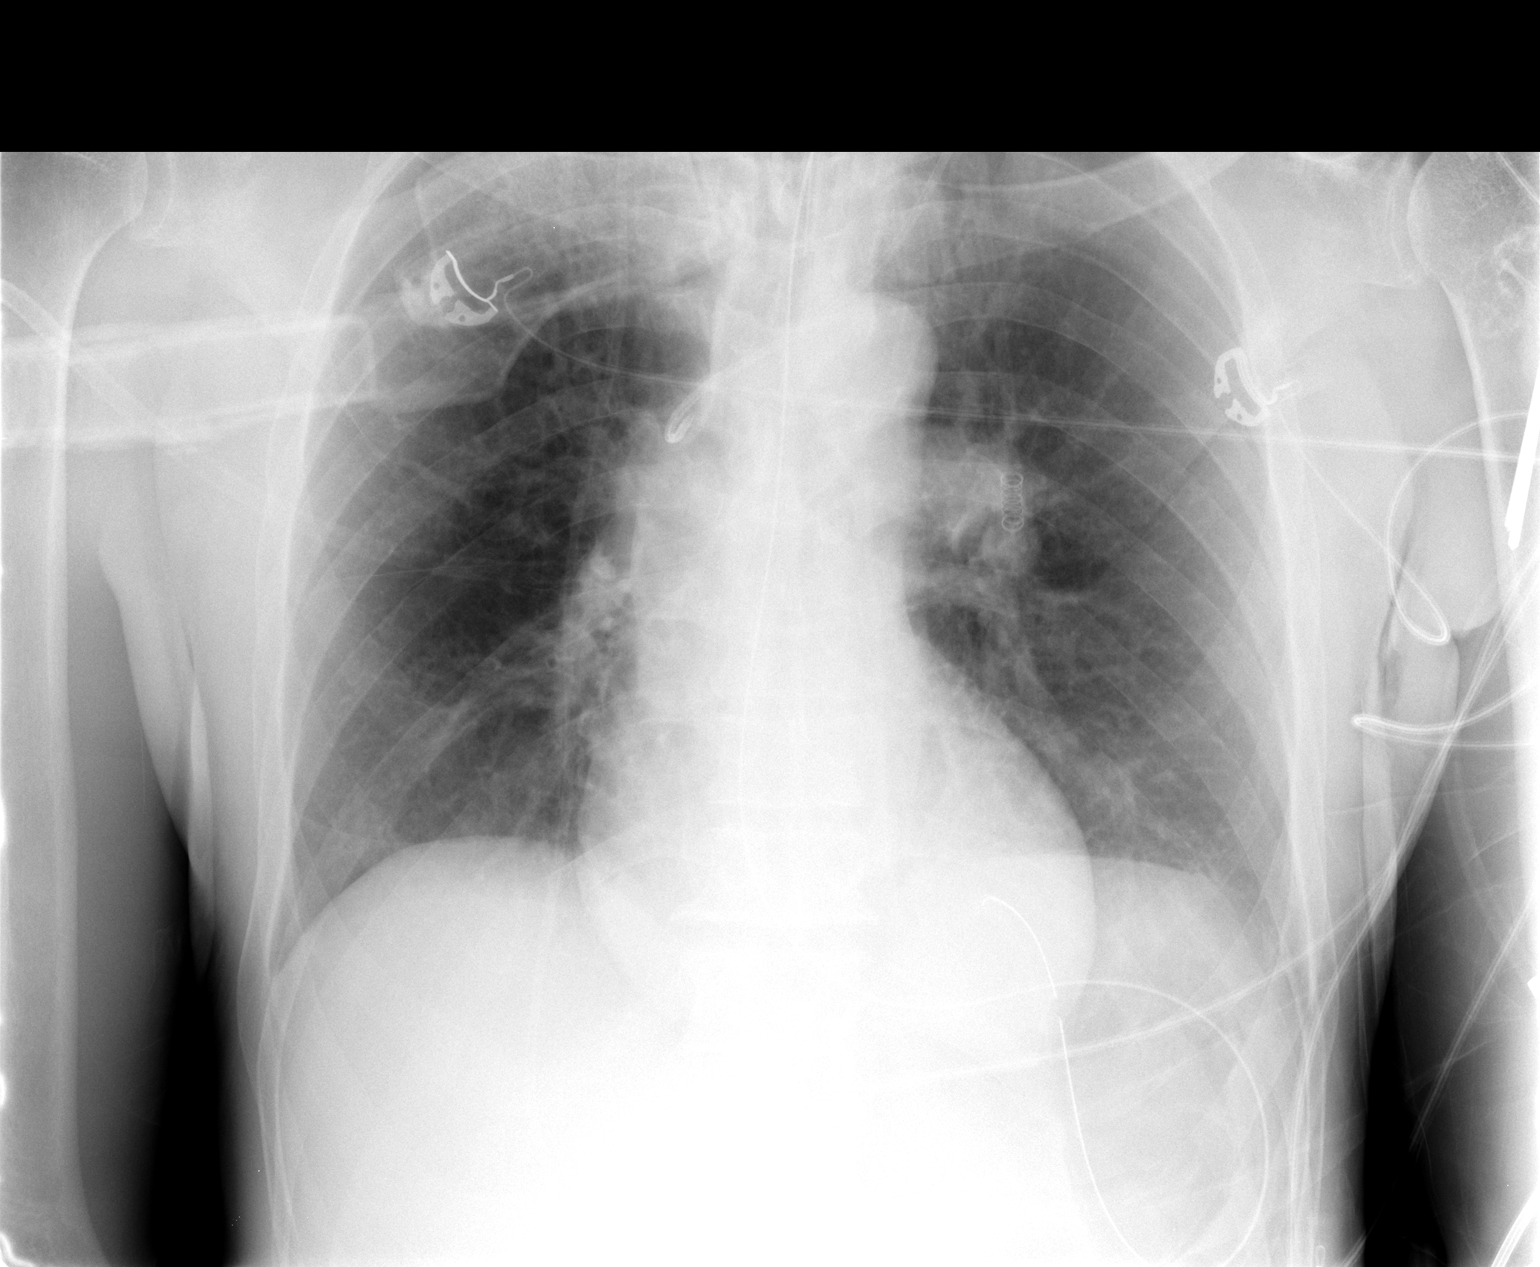

[1 of 1 positions shown; findings below may reference images not displayed]

FINDINGS: Tip of endotracheal tube 5.0 cm above carina.
Nasogastric tube coiled the stomach.
Left jugular line, tip in SVC likely extending into azygos vein.
Stable heart size and mediastinal contours.
Support devices project over chest.
Minimal left basilar atelectasis versus infiltrate.
Minimal bronchitic changes.
IMPRESSION: Tip of left jugular line is likely within the azygos vein at the
the confluence.
Minimal left basilar atelectasis versus infiltrate.

## 2010-01-22 IMAGING — CR DG CHEST 1V PORT
1 series · 1 of 1 positions shown · non-contrast
Comparison: Portable exam 0710 hours compared to 01/04/2009

CLINICAL DATA: Respiratory failure

PORTABLE CHEST - 1 VIEW

[view not recorded]
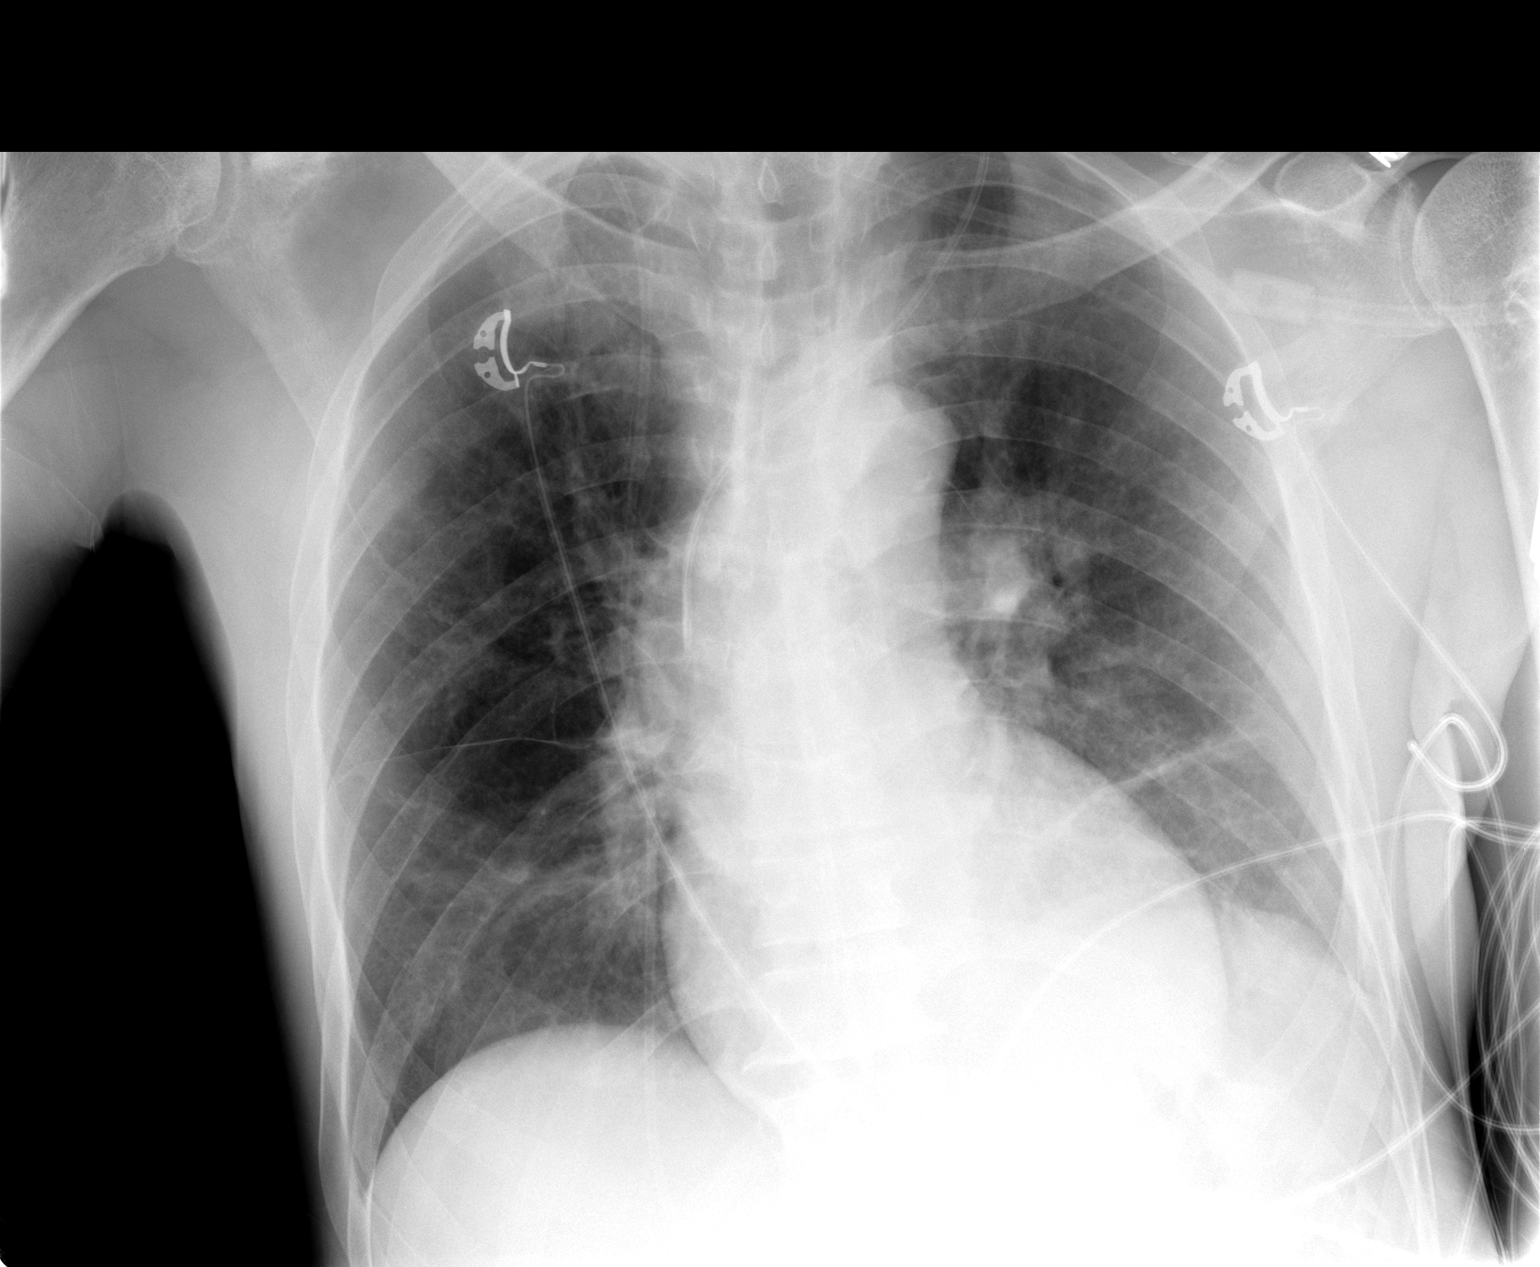

[1 of 1 positions shown; findings below may reference images not displayed]

FINDINGS: Left jugular line stable, tip SVC.
Endotracheal and nasogastric tubes removed.
Stable heart size and mediastinal contours.
Mild left perihilar and basilar infiltrate.
Minimal atelectasis left base.
Remaining lungs clear.
VP shunt tubing traverses right hemithorax.
Bones unremarkable.
IMPRESSION: Mild infiltrate and atelectasis left lower lobe.

## 2010-08-19 ENCOUNTER — Encounter: Payer: Self-pay | Admitting: Neurosurgery

## 2010-10-21 LAB — CBC
HCT: 40.2 % (ref 39.0–52.0)
HCT: 44.7 % (ref 39.0–52.0)
Hemoglobin: 13.3 g/dL (ref 13.0–17.0)
Hemoglobin: 13.6 g/dL (ref 13.0–17.0)
Hemoglobin: 14 g/dL (ref 13.0–17.0)
Hemoglobin: 14.6 g/dL (ref 13.0–17.0)
MCHC: 33.1 g/dL (ref 30.0–36.0)
Platelets: 186 10*3/uL (ref 150–400)
RBC: 4.23 MIL/uL (ref 4.22–5.81)
RBC: 4.29 MIL/uL (ref 4.22–5.81)
RBC: 4.45 MIL/uL (ref 4.22–5.81)
RDW: 15.2 % (ref 11.5–15.5)
RDW: 15.3 % (ref 11.5–15.5)
RDW: 15.4 % (ref 11.5–15.5)
RDW: 15.8 % — ABNORMAL HIGH (ref 11.5–15.5)
WBC: 6.9 10*3/uL (ref 4.0–10.5)
WBC: 7 10*3/uL (ref 4.0–10.5)
WBC: 8.4 10*3/uL (ref 4.0–10.5)

## 2010-10-21 LAB — BASIC METABOLIC PANEL
Calcium: 8.8 mg/dL (ref 8.4–10.5)
GFR calc Af Amer: >60 mL/min (ref >60)
GFR calc Af Amer: >60 mL/min (ref >60)
GFR calc non Af Amer: >60 mL/min (ref >60)
GFR calc non Af Amer: >60 mL/min (ref >60)
GFR calc non Af Amer: >60 mL/min (ref >60)
Glucose, Bld: 110 mg/dL — ABNORMAL HIGH (ref 70–99)
Glucose, Bld: 110 mg/dL — ABNORMAL HIGH (ref 70–99)
Glucose, Bld: 116 mg/dL — ABNORMAL HIGH (ref 70–99)
Potassium: 3.8 mEq/L (ref 3.5–5.1)
Potassium: 4 mEq/L (ref 3.5–5.1)
Sodium: 138 mEq/L (ref 135–145)
Sodium: 138 mEq/L (ref 135–145)
Sodium: 140 mEq/L (ref 135–145)

## 2010-10-21 LAB — COMPREHENSIVE METABOLIC PANEL
ALT: 31 U/L (ref 0–53)
AST: 20 U/L (ref 0–37)
AST: 23 U/L (ref 0–37)
Albumin: 3.2 g/dL — ABNORMAL LOW (ref 3.5–5.2)
Albumin: 3.3 g/dL — ABNORMAL LOW (ref 3.5–5.2)
Alkaline Phosphatase: 76 U/L (ref 39–117)
Alkaline Phosphatase: 80 U/L (ref 39–117)
Alkaline Phosphatase: 84 U/L (ref 39–117)
BUN: 8 mg/dL (ref 6–23)
CO2: 25 mEq/L (ref 19–32)
Calcium: 8.8 mg/dL (ref 8.4–10.5)
Calcium: 8.9 mg/dL (ref 8.4–10.5)
Chloride: 104 mEq/L (ref 96–112)
Chloride: 106 mEq/L (ref 96–112)
GFR calc Af Amer: >60 mL/min (ref >60)
GFR calc Af Amer: >60 mL/min (ref >60)
GFR calc non Af Amer: >60 mL/min (ref >60)
Glucose, Bld: 127 mg/dL — ABNORMAL HIGH (ref 70–99)
Potassium: 3.8 mEq/L (ref 3.5–5.1)
Potassium: 3.9 mEq/L (ref 3.5–5.1)
Potassium: 4 mEq/L (ref 3.5–5.1)
Sodium: 139 mEq/L (ref 135–145)
Sodium: 142 mEq/L (ref 135–145)
Total Bilirubin: 0.4 mg/dL (ref 0.3–1.2)
Total Bilirubin: 0.5 mg/dL (ref 0.3–1.2)
Total Protein: 6.7 g/dL (ref 6.0–8.3)
Total Protein: 6.8 g/dL (ref 6.0–8.3)

## 2010-10-21 LAB — URINALYSIS, ROUTINE W REFLEX MICROSCOPIC
Bilirubin Urine: NEGATIVE
Glucose, UA: NEGATIVE mg/dL
Hgb urine dipstick: NEGATIVE
Nitrite: NEGATIVE
Specific Gravity, Urine: 1.01 (ref 1.005–1.030)
Specific Gravity, Urine: 1.014 (ref 1.005–1.030)
Urobilinogen, UA: 0.2 mg/dL (ref 0.0–1.0)
pH: 7 (ref 5.0–8.0)
pH: 8 (ref 5.0–8.0)

## 2010-10-21 LAB — URINE CULTURE
Colony Count: 80000
Colony Count: NO GROWTH

## 2010-10-21 LAB — DIFFERENTIAL
Basophils Absolute: 0.1 10*3/uL (ref 0.0–0.1)
Basophils Absolute: 0.1 10*3/uL (ref 0.0–0.1)
Basophils Relative: 1 % (ref 0–1)
Basophils Relative: 1 % (ref 0–1)
Eosinophils Relative: 2 % (ref 0–5)
Eosinophils Relative: 4 % (ref 0–5)
Lymphocytes Relative: 31 % (ref 12–46)
Lymphocytes Relative: 41 % (ref 12–46)
Lymphs Abs: 1.8 10*3/uL (ref 0.7–4.0)
Monocytes Absolute: 0.8 10*3/uL (ref 0.1–1.0)
Monocytes Relative: 11 % (ref 3–12)
Monocytes Relative: 12 % (ref 3–12)
Neutro Abs: 4.7 10*3/uL (ref 1.7–7.7)
Neutro Abs: 4.8 10*3/uL (ref 1.7–7.7)

## 2010-10-21 LAB — PROTIME-INR: INR: 0.96 (ref 0.00–1.49)

## 2010-10-21 LAB — POCT CARDIAC MARKERS
CKMB, poc: <1 ng/mL — ABNORMAL LOW (ref 1.0–8.0)
Myoglobin, poc: 59.5 ng/mL (ref 12–200)
Troponin i, poc: <0.05 ng/mL (ref 0.00–0.09)

## 2010-10-21 LAB — RPR: RPR Ser Ql: NONREACTIVE

## 2010-10-21 LAB — LIPID PANEL
HDL: 57 mg/dL (ref >39)
LDL Cholesterol: 92 mg/dL (ref 0–99)
Total CHOL/HDL Ratio: 3 RATIO
VLDL: 22 mg/dL (ref 0–40)

## 2010-10-21 LAB — HOMOCYSTEINE: Homocysteine: 7 umol/L (ref 4.0–15.4)

## 2010-10-21 LAB — RAPID URINE DRUG SCREEN, HOSP PERFORMED: Tetrahydrocannabinol: NOT DETECTED

## 2010-10-21 LAB — PHENYTOIN LEVEL, TOTAL: Phenytoin Lvl: 10.4 ug/mL (ref 10.0–20.0)

## 2010-10-31 IMAGING — MR MR MRA HEAD W/O CM
18 series · 18 of 18 positions shown · IV contrast (12    MULTIHANCE)
Comparison: CT head 01/03/2009 and 10/14/2009.

MRI HEAD

CLINICAL DATA: Right-sided numbness and weakness.  Gait
instability.  Slurred speech.

MRI HEAD WITHOUT AND WITH CONTRAST
MRA HEAD WITHOUT CONTRAST
MRA NECK WITHOUT AND WITH CONTRAST
TECHNIQUE: Multiplanar, multiecho pulse sequences of the brain and
surrounding structures were obtained according to standard protocol
without and with intravenous contrast.  Angiographic images of the
Circle of Willis were obtained using MRA technique without
intravenous contrast.  Angiographic images of the neck were
obtained using MRA technique without and with intravenous contrast.
Carotid stenosis measurements (when applicable) are obtained
utilizing NASCET criteria, using the distal internal carotid
diameter as the denominator.
Contrast: 12 ml Multihance.

[Series 1: loc · axial · 10.0mm · 0.94mm/px · 1 of 17 slices shown]
[im 1/17]
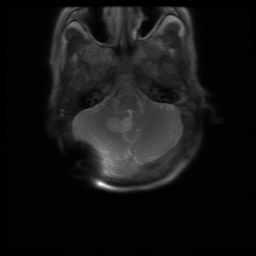

[Series 3: T1 · sagittal · 5.0mm · 0.47mm/px · 1 of 16 slices shown (1 of 3)]
[im 1/16]
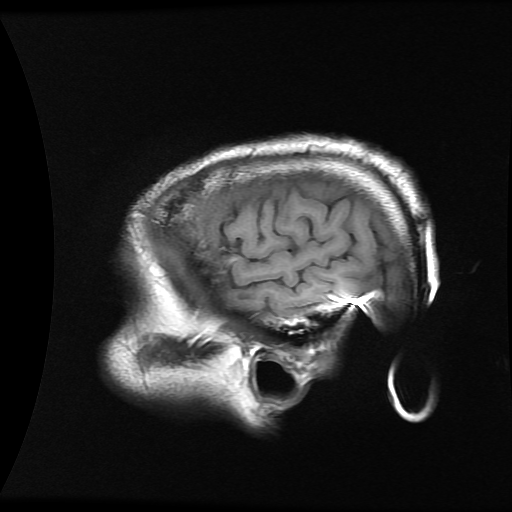

[Series 4: (id) mt fs · axial · 1.6mm · 0.45mm/px · 1 of 130 slices shown]
[im 1/130]
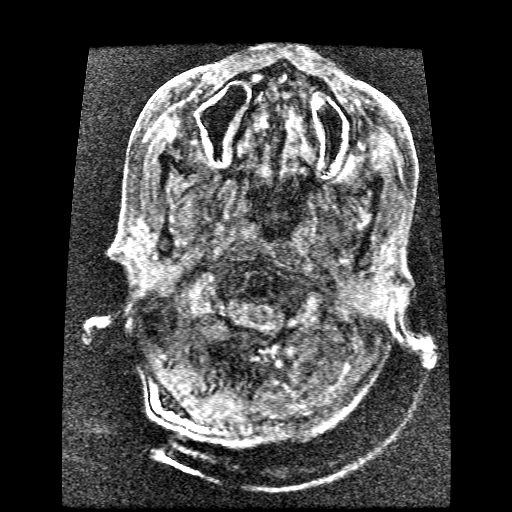

[Series 5: DWI · axial · 5.0mm · 1.09mm/px · 1 of 52 slices shown (1 of 3)]
[im 1/52]
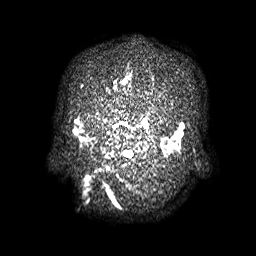

[Series 6: T2 · axial · 5.0mm · 0.43mm/px · 1 of 24 slices shown (1 of 4)]
[im 1/24]
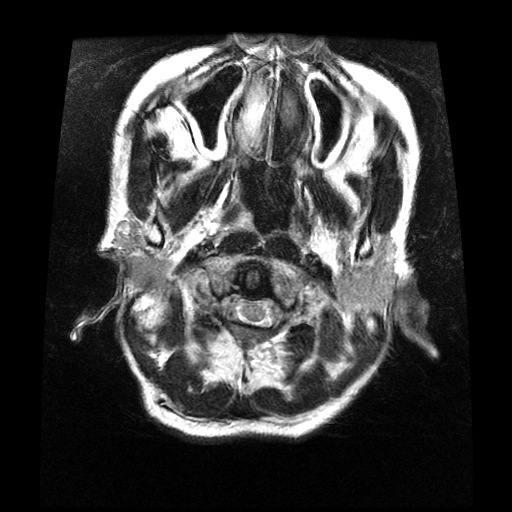

[Series 7: FLAIR · axial · 5.0mm · 0.43mm/px · 1 of 24 slices shown]
[im 1/24]
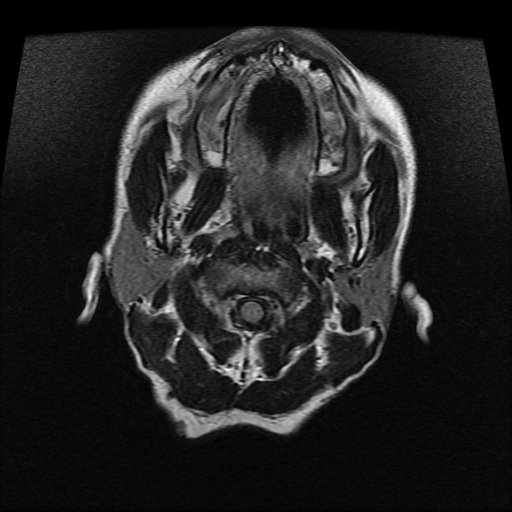

[Series 9: T2 · coronal · 5.0mm · 0.39mm/px · 1 of 26 slices shown (2 of 4)]
[im 1/26]
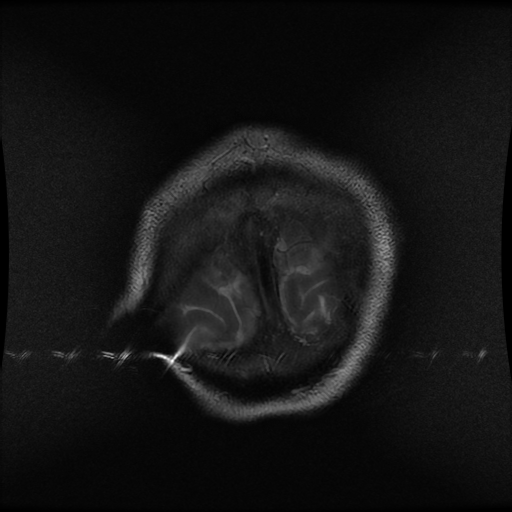

[Series 11: ax fspgr 3d · axial · non-contrast · 3.0mm · 0.43mm/px · 1 of 60 slices shown (1 of 2)]
[im 1/60]
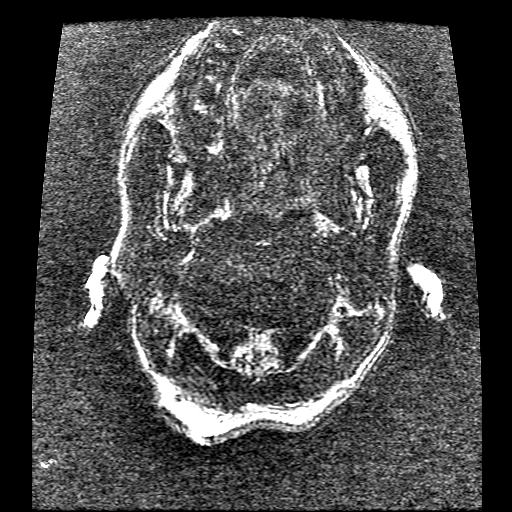

[Series 12: T2 · coronal · 5.0mm · 0.39mm/px · 1 of 26 slices shown (3 of 4)]
[im 1/26]
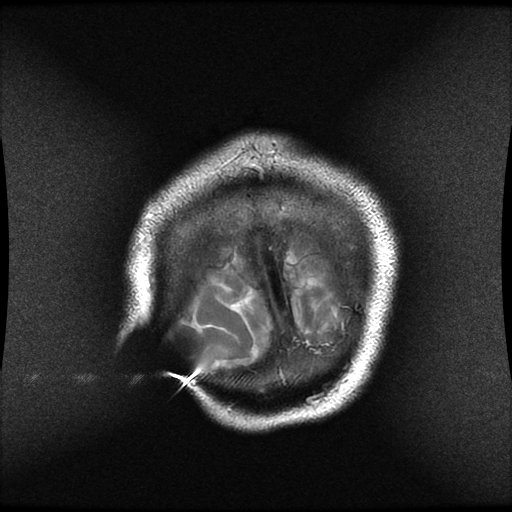

[Series 13: T2 · coronal · 3.0mm · 0.35mm/px · 1 of 30 slices shown (4 of 4)]
[im 1/30]
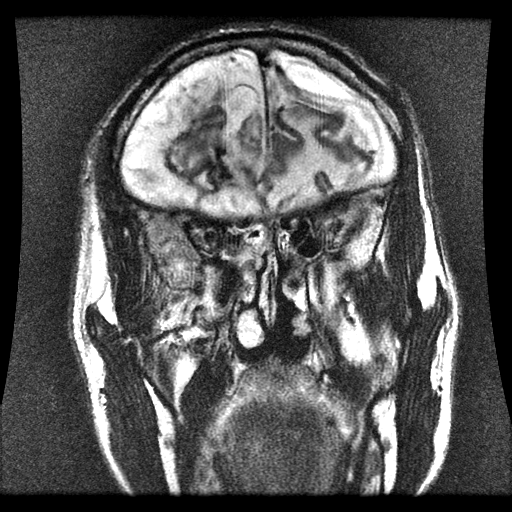

[Series 18: ax fspgr 3d · axial · 3.0mm · 0.43mm/px · 1 of 120 slices shown (2 of 2)]
[im 1/120]
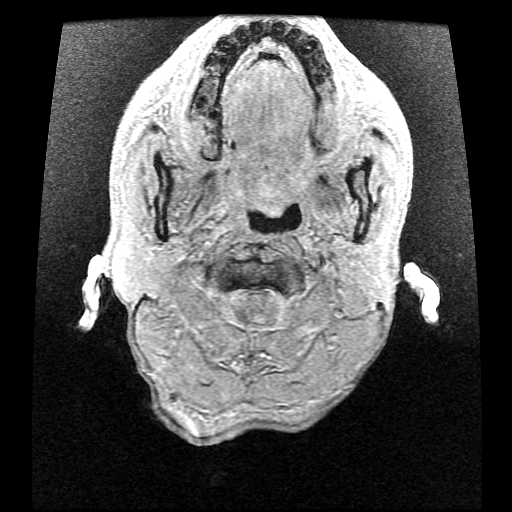

[Series 19: T1 · coronal · 5.0mm · 0.39mm/px · 1 of 26 slices shown (2 of 3)]
[im 1/26]
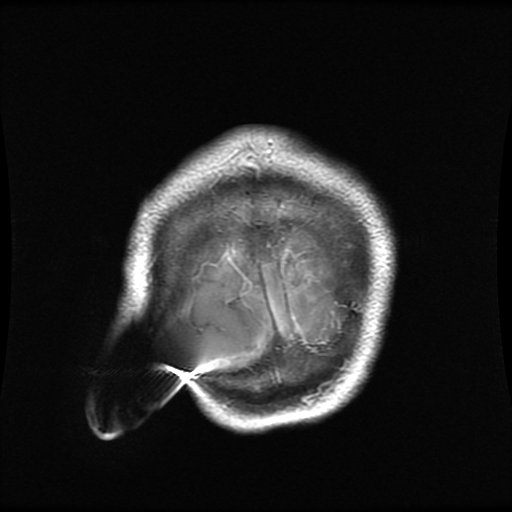

[Series 20: T1 · sagittal · 5.0mm · 0.47mm/px · 1 of 16 slices shown (3 of 3)]
[im 1/16]
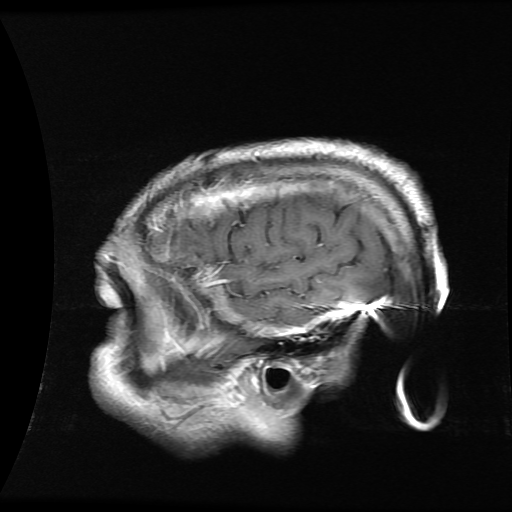

[Series 406: pjn · axial · 1.6mm · 0.35mm/px · 1 of 1 slices shown]
[im 1/1]
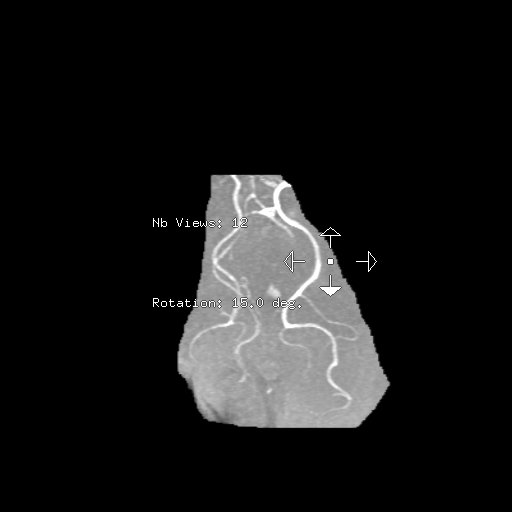

[Series 500: DWI · axial · 5.0mm · 1.09mm/px · 1 of 26 slices shown (2 of 3)]
[im 1/26]
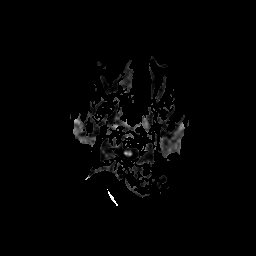

[Series 501: DWI · axial · 5.0mm · 1.09mm/px · 1 of 26 slices shown (3 of 3)]
[im 1/26]
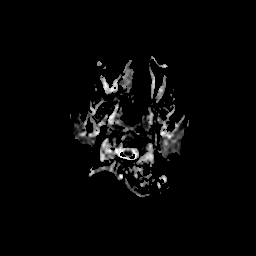

[((date))-((date)) · coronal · 1.4mm · 0.59mm/px · 1 of 100 slices shown (1 of 2)]
[im 1/100]
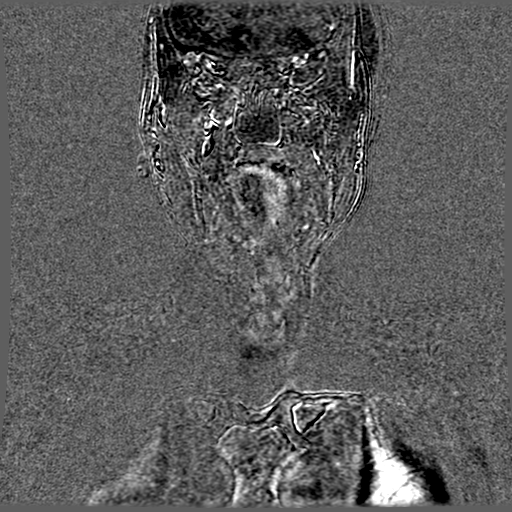

[((date))-((date)) · coronal · 1.4mm · 0.59mm/px · 1 of 100 slices shown (2 of 2)]
[im 1/100]
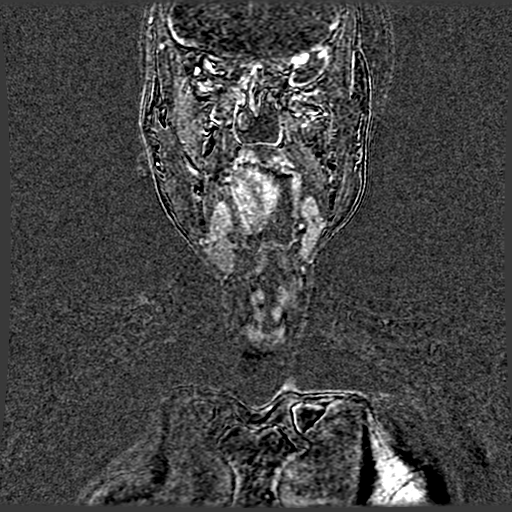

[18 of 18 positions shown; findings below may reference images not displayed]

FINDINGS: Since the study December 2008, there have been new
infarcts within the right cerebellum and posterior lateral right
the dual lead.  There are additional areas of ill-defined edema in
the cerebellum.  It is unclear if there is any acute diffusion
abnormality is there is significant artifact from the patients
shunt catheter.  I suspect that portions, including the mid brain
lesion, are acute / subacute.

Encephalomalacia of the frontal lobes bilaterally is not
significantly changed.  There is a chronic left frontal subdural
hygroma.  Diffuse dural enhancement is present throughout.  Areas
of remote encephalomalacia are seen in the temporal tips
bilaterally as well as more lateral left temporal lobe.

Flow is present in the major intracranial arteries.  The globes
orbits are intact.  Mucosal thickening is scattered throughout the
paranasal sinuses.  No air-fluid levels are evident.  There is
fluid in the mastoid air cells bilaterally.  No definite
obstructing nasopharyngeal lesion is identified.

No other abnormal parenchymal enhancement is identified.
Calcification associated with the right frontal lobe
encephalomalacia is stable.
IMPRESSION: 1.  Interval right cerebellar and posterolateral right medullary
infarcts.  The area is limited by artifact from the patients shunt
catheter, but I suspect that a portion of these infarcts are acute
or subacute, including the lesion in the posterolateral medulla.

2.  Extensive encephalomalacia of the frontal lobes and temporal
lobes bilaterally is stable.  The worst area is in the right
frontal lobe.
3.  Stable chronic left frontal subdural hygroma.
4.  Diffuse dural enhancement.  This is likely chronic related to
the shunt catheter or remote trauma.  Infection cannot be excluded
and sampling from the ventriculostomy shunt may be useful to
exclude infection in the appropriate clinical setting.

MRA HEAD
FINDINGS: The internal carotid arteries are within normal limits
bilaterally from high cervical segments through the ICA termini.
There is decreased signal within the left A1 segment without a
discrete lesion.  The right A1 segment is normal.  The M1 segments
are normal bilaterally.  There is early bifurcation of the left
MCA.  Significant segmental narrowing and distal vessel attenuation
is present within the MCA branches bilaterally.  The anterior
communicating artery is patent.

The basilar artery is small.  The distal vertebral arteries are not
well visualized.  The posterior cerebral arteries originate
predominately from posterior communicating arteries with small P1
contributions.  There is segmental attenuation of distal branches,
worse on the right.
IMPRESSION: 1.  Extensive small vessel disease.
2.  The distal vertebral arteries are not visualized.
3.  The basilar artery is small.
4.  Both posterior cerebral arteries originate from the posterior
communicating arteries, fetal origin.

MRA NECK
FINDINGS: The time-of-flight images demonstrate no significant flow
disturbance within either carotid bifurcation.  The vertebral
arteries are not visualized.

Post contrast images demonstrate a standard three-vessel arch
configuration

There is mild irregularity of the proximal right internal carotid
artery without significant stenosis by NASCET criteria.  The distal
ICA is unremarkable.

The left carotid bifurcation is within normal limits.  No
significant stenosis is present.

Proximal vertebral arteries are not visualized and may be occluded
bilaterally.  These may have started as hypoplastic vessels given
the fetal type posterior cerebral arteries bilaterally.  The distal
left vertebral artery is present at the skull base.  It is unclear
what reconstitutes this vessel or if it is filled from above.  The
basilar artery is small.
IMPRESSION: 1.  Mild irregularity of the right internal carotid artery without
significant stenosis.
2.  Normal left carotid bifurcation.
3.  The vertebral arteries are not visualized, which likely
represents chronic occlusion.
4.  The basilar artery is small as described above.  There may be
some reconstitution of the distal left vertebral artery although it
is unclear from what vessel.

## 2010-10-31 IMAGING — CT CT HEAD W/O CM
1 series · 15 of 30 positions shown, 19 images · non-contrast
Comparison: 01/03/2009

CLINICAL DATA: Right-sided numbness and weakness

CT HEAD WITHOUT CONTRAST
TECHNIQUE: Contiguous axial images were obtained from the base of
the skull through the vertex without contrast.

[Series 2: head trauma 4.8 h37s · axial · 0.44mm/px · z∈[+1286,+1424]mm · 15 of 30 slices shown, 19 images]
[im 2/30  brain]
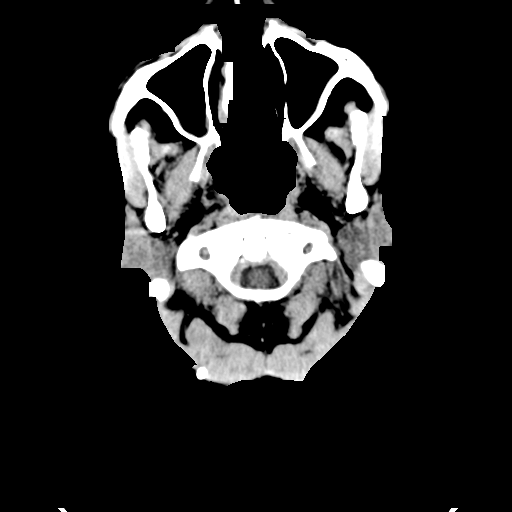
[im 2/30  bone]
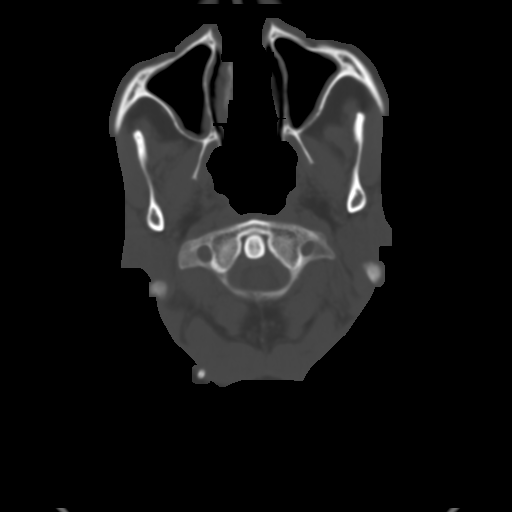
[im 4/30  brain]
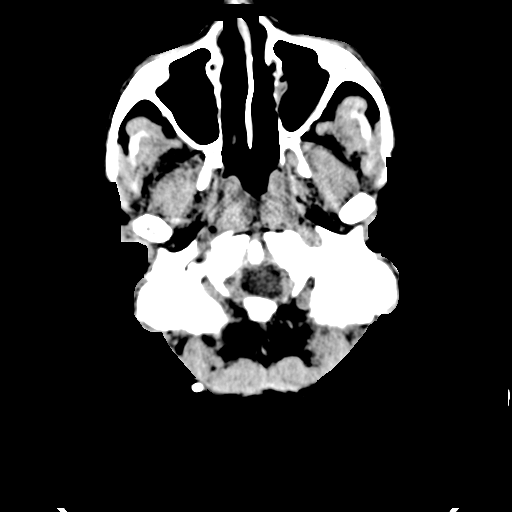
[im 6/30  brain]
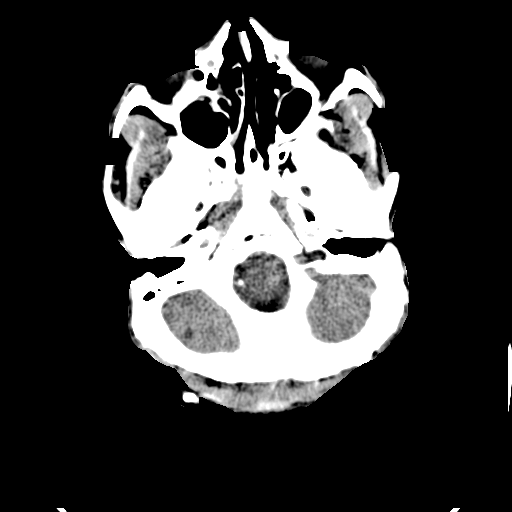
[im 8/30  brain]
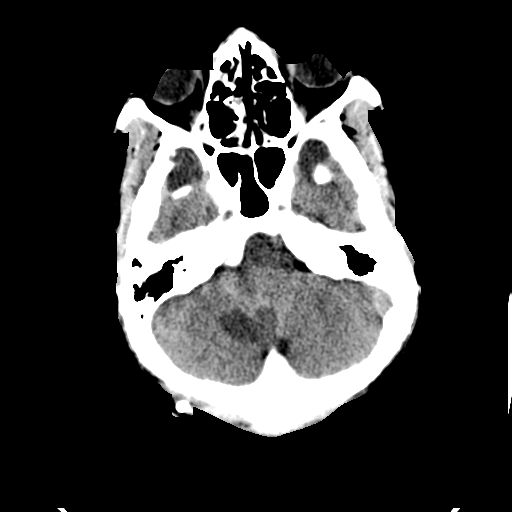
[im 10/30  brain]
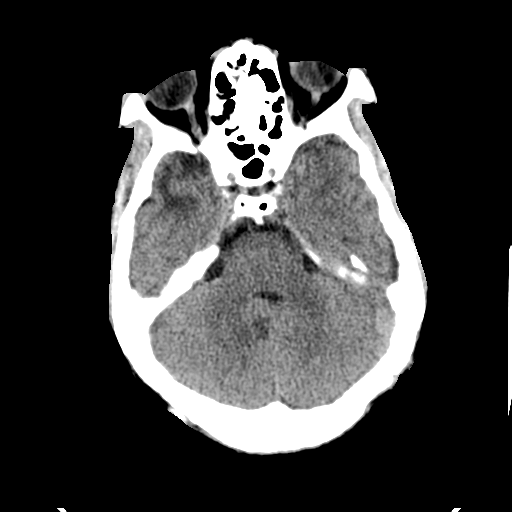
[im 10/30  bone]
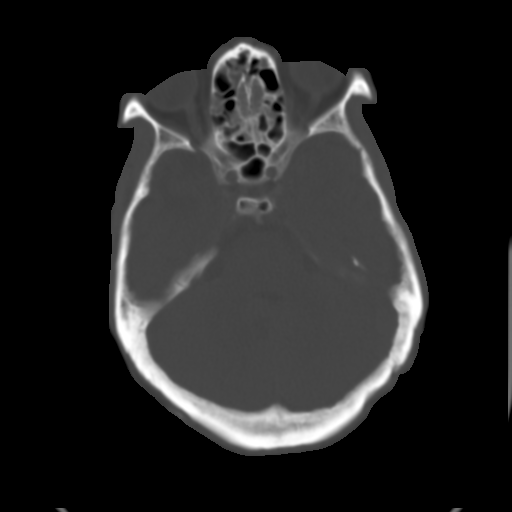
[im 12/30  brain]
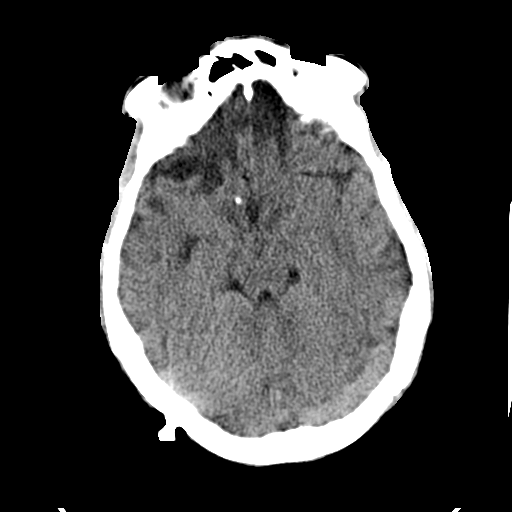
[im 14/30  brain]
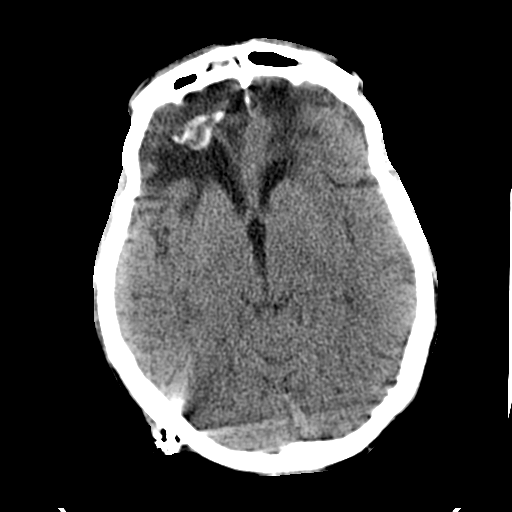
[im 16/30  brain]
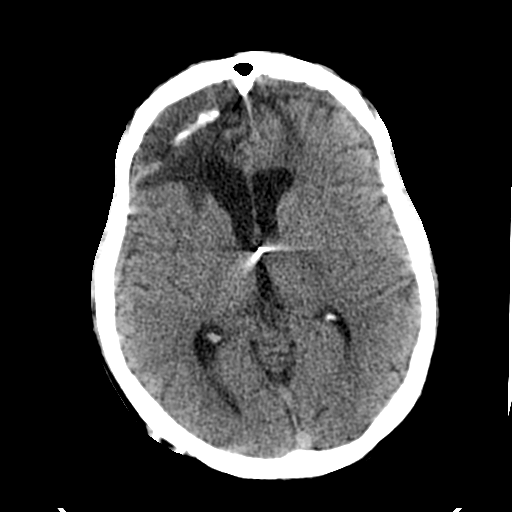
[im 17/30  brain]
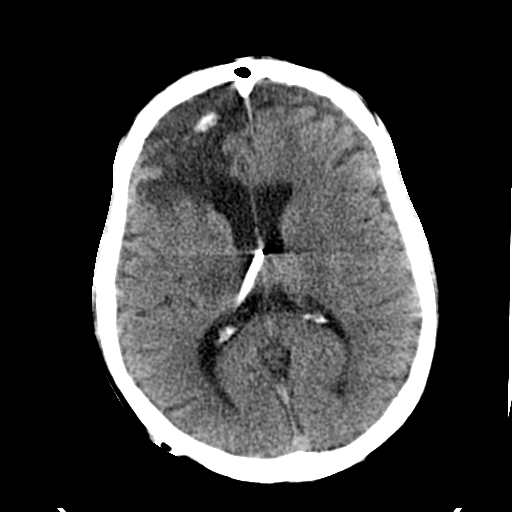
[im 17/30  bone]
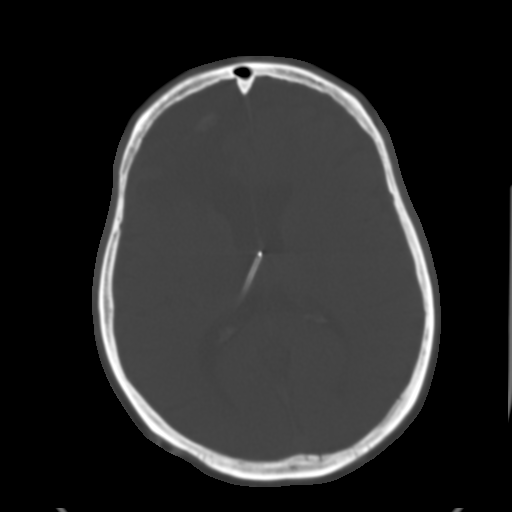
[im 19/30  brain]
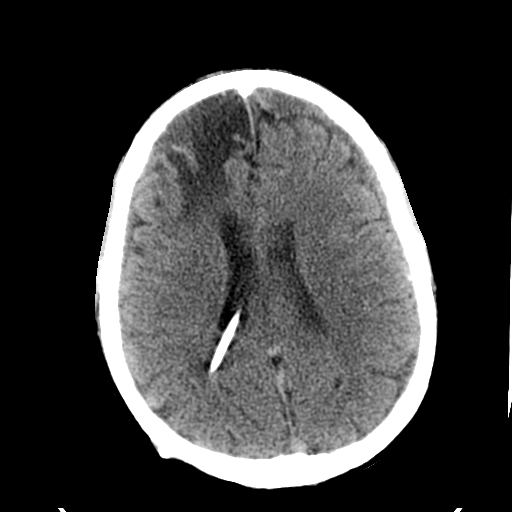
[im 21/30  brain]
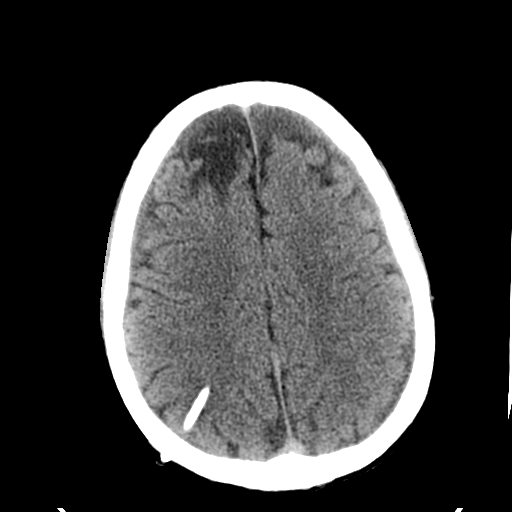
[im 23/30  brain]
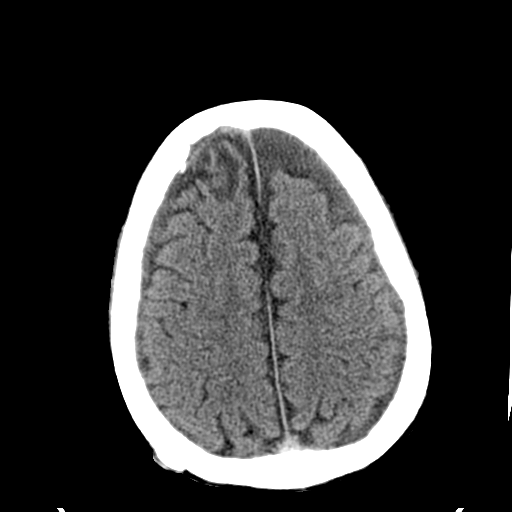
[im 25/30  brain]
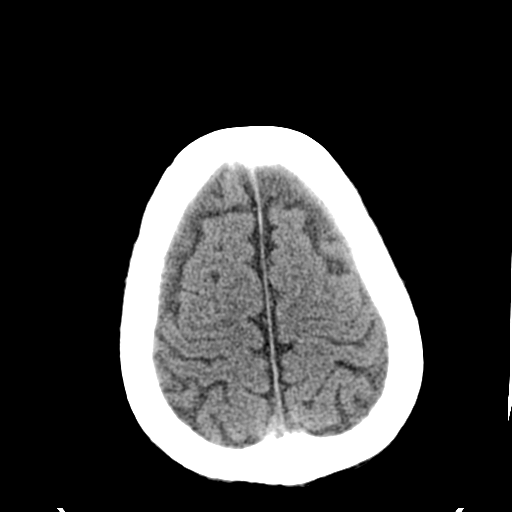
[im 25/30  bone]
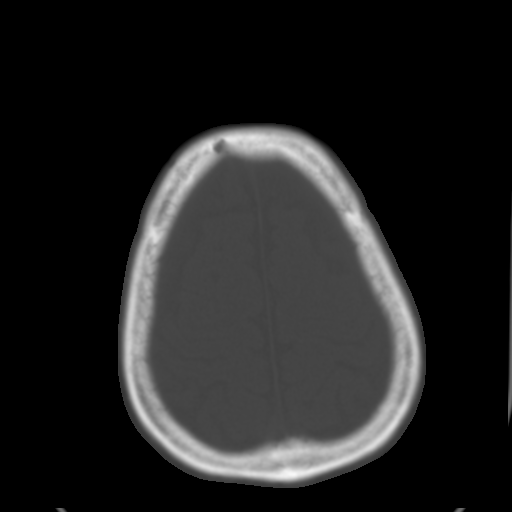
[im 27/30  brain]
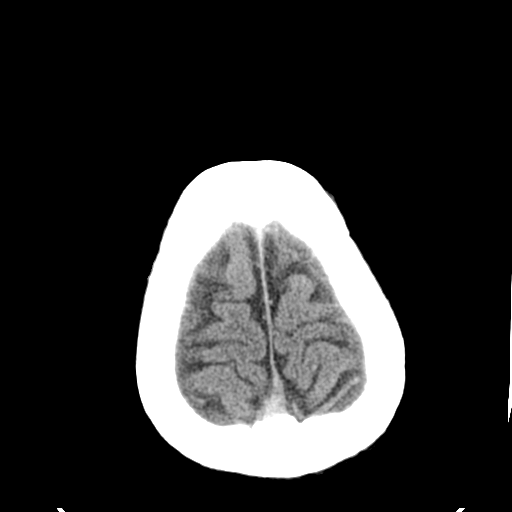
[im 29/30  brain]
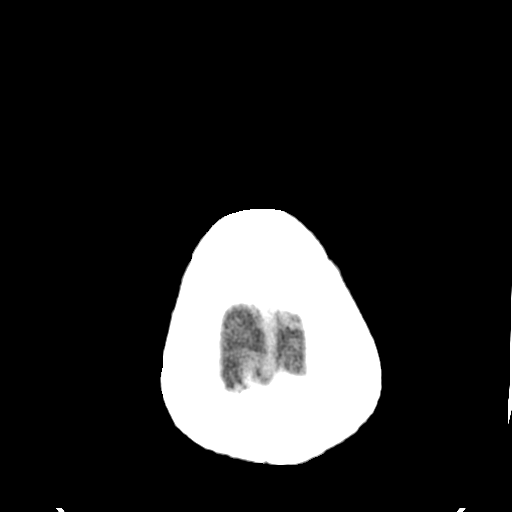

[15 of 30 positions shown; findings below may reference images not displayed]

FINDINGS: Right occipital shunt extends into the right lateral
ventricle as before.  No hydrocephalus or midline shift.
Encephalomalacia in the right frontal lobe with dystrophic
calcification as before.  There has been interval infarct medially
in the right cerebellar hemisphere since the prior study.  Negative
for acute intracranial hemorrhage, midline shift, or mass effect.
Moderate atrophy with resultant prominence of ventricles and sulci
and prominence of extra-axial the CSF spaces as before.  There is
diffuse mucoperiosteal thickening noted bilateral maxillary,
ethmoid, and frontal sinuses.  Acute infarct may be inapparent on
noncontrast CT.  No acute calvarial abnormality.
IMPRESSION: 1.  Stable chronic and postoperative intracranial changes, with
interval right cerebellar hemisphere infarct.
2.  Negative for bleed or other acute intracranial process.

## 2010-10-31 IMAGING — CR DG ABDOMEN 1V
2 series · 2 of 2 positions shown · non-contrast
Comparison: Abdomen film of 04/22/2007

CLINICAL DATA: Right-sided weakness and numbness, the patient has a
VP shunt

ABDOMEN - 1 VIEW

[t abdomen supine * (1 of 2)]
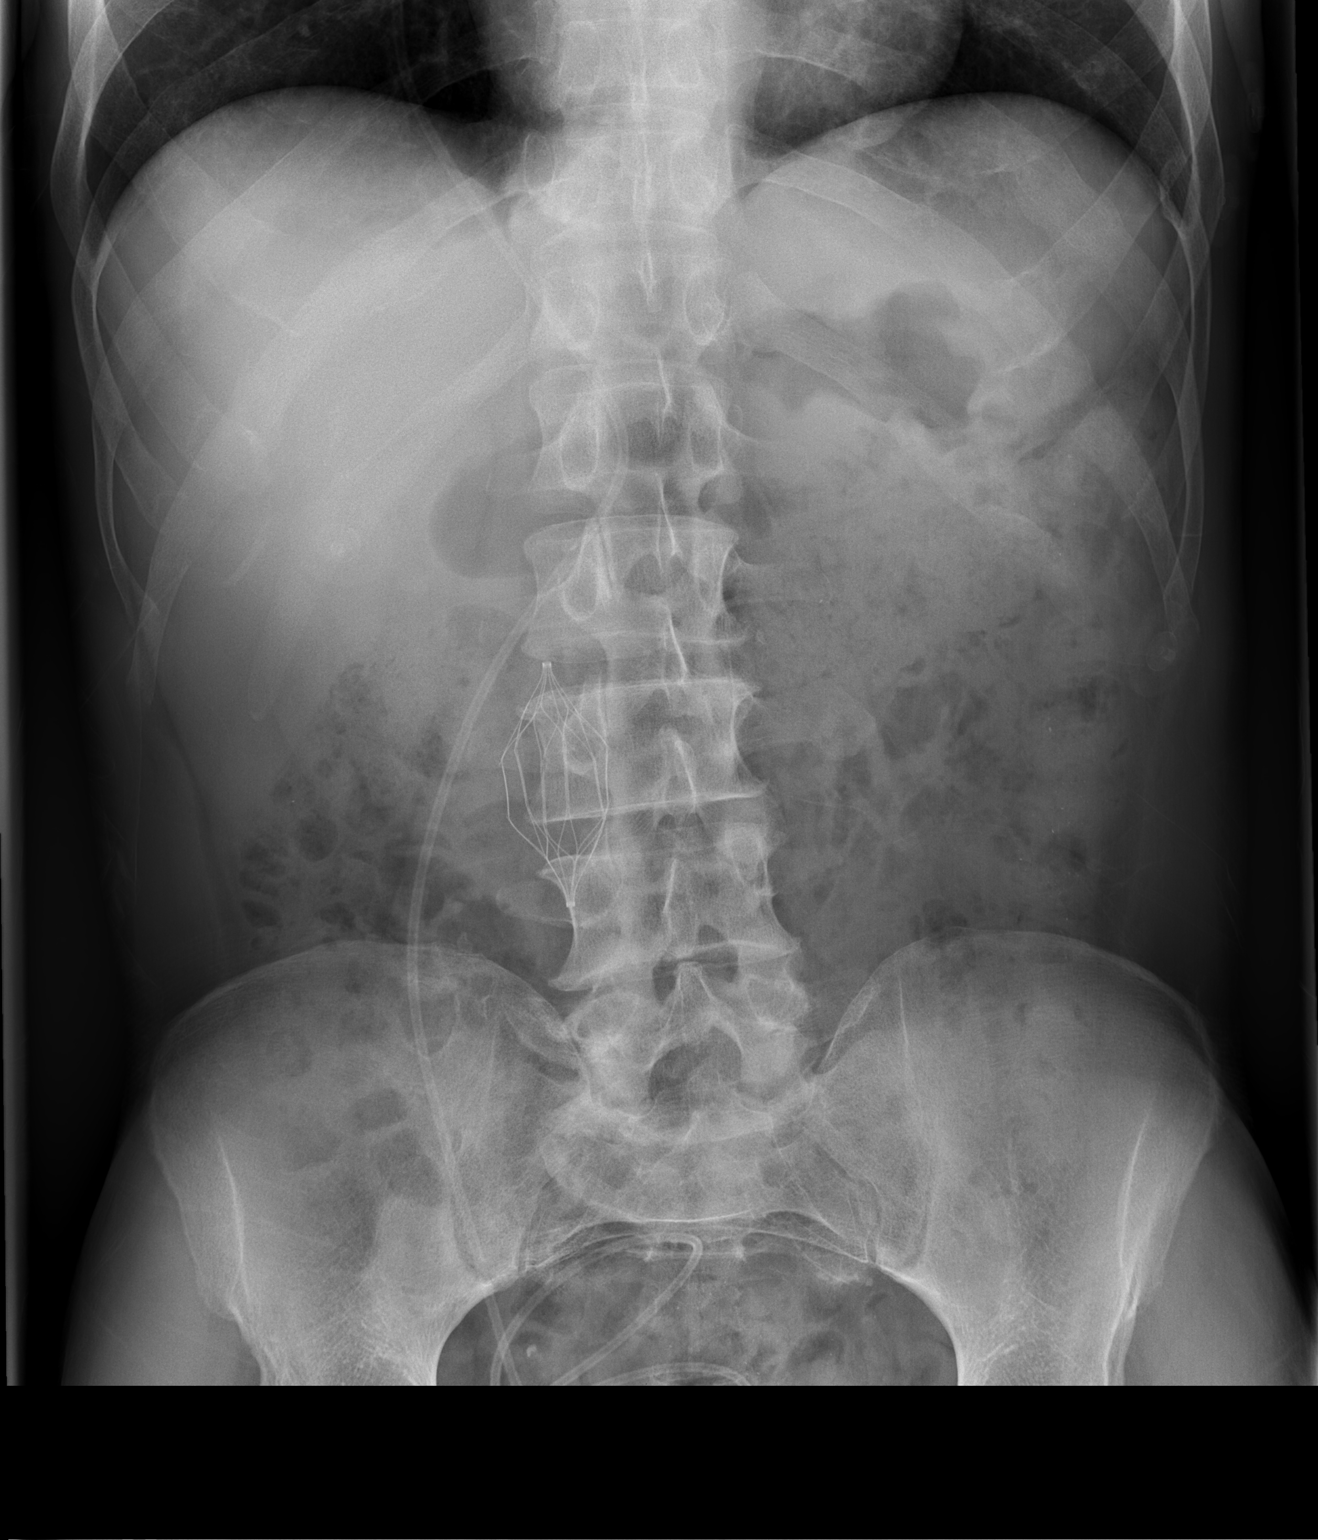

[t abdomen supine * (2 of 2)]
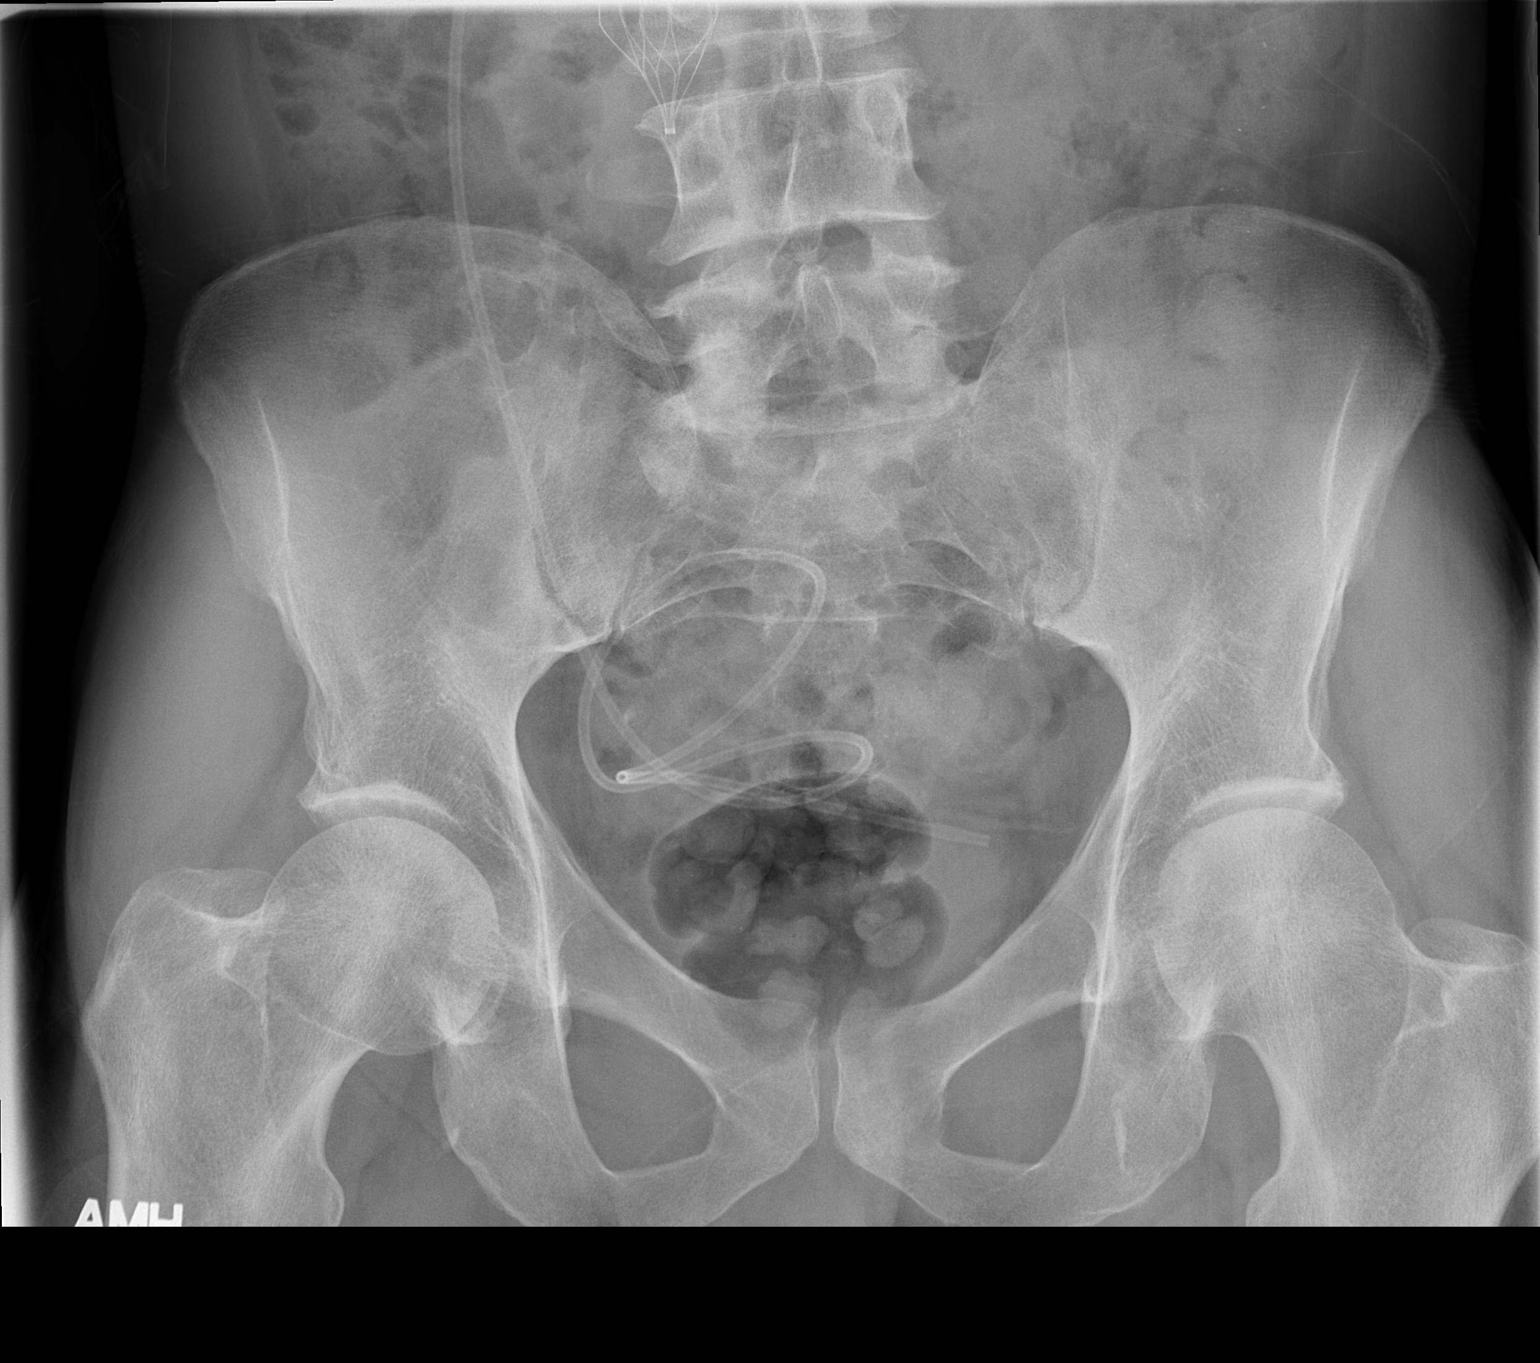

[2 of 2 positions shown; findings below may reference images not displayed]

FINDINGS: The VP shunt catheter appears intact, coiling in the mid
pelvis.  The bowel gas pattern is nonspecific.  An IVC filter is
noted.
IMPRESSION: Shunt catheter appears intact, coiling in the mid pelvis.

## 2010-11-02 IMAGING — CT CT HEAD W/O CM
1 of 2 series · 13 of 30 positions shown, 17 images · non-contrast
Comparison: [DATE]

CLINICAL DATA: Follow-up cerebellar stroke and right-sided
subdural.  VP shunt.

CT HEAD WITHOUT CONTRAST
TECHNIQUE: Contiguous axial images were obtained from the base of
the skull through the vertex without contrast.

[Series 2: brain · axial · 0.47mm/px · z∈[+164,+300]mm · 13 of 28 slices shown, 17 images]
[im 2/28  brain]
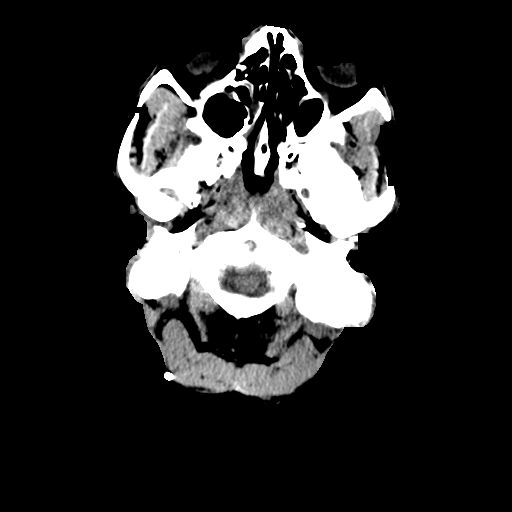
[im 2/28  bone]
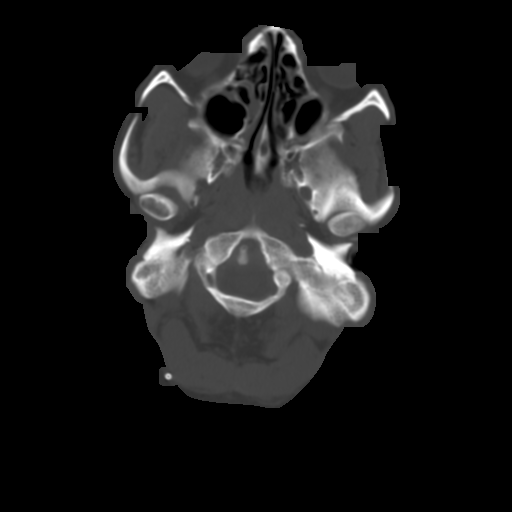
[im 4/28  brain]
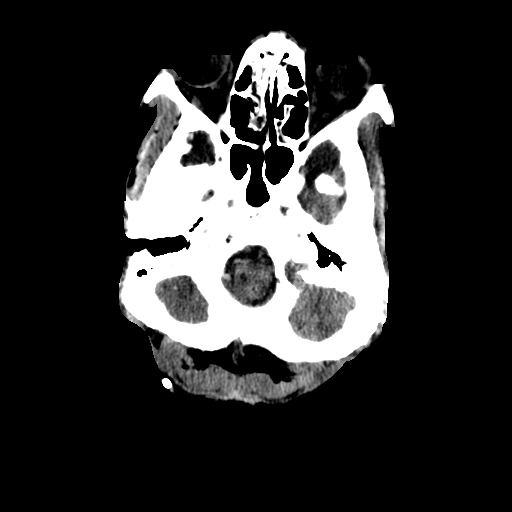
[im 6/28  brain]
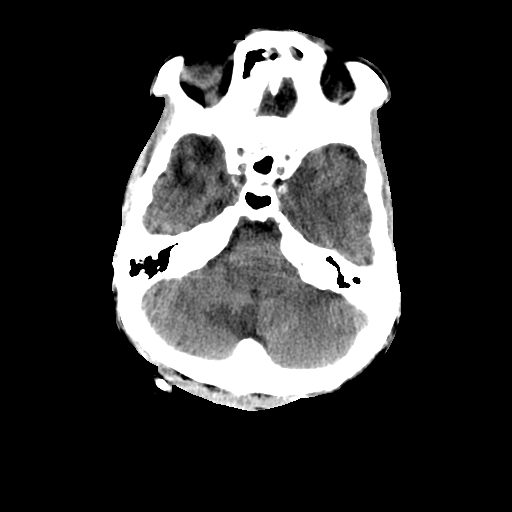
[im 8/28  brain]
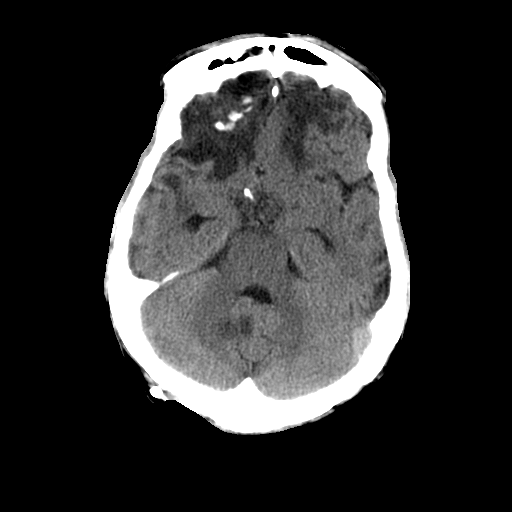
[im 10/28  brain]
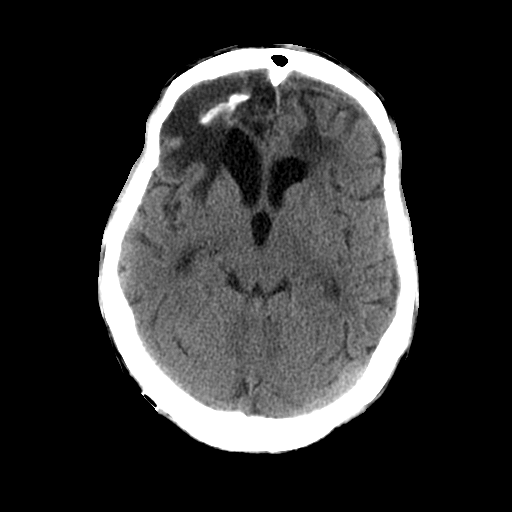
[im 10/28  bone]
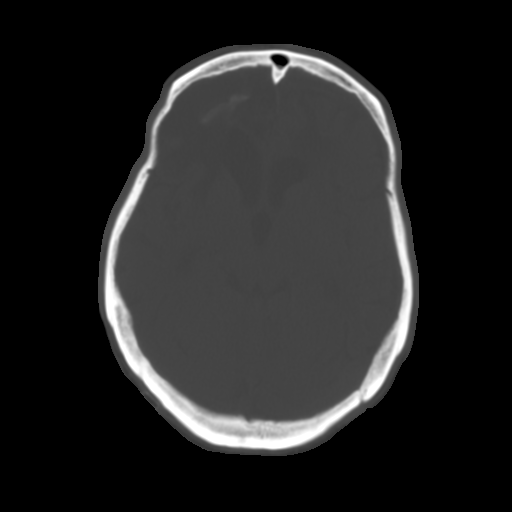
[im 12/28  brain]
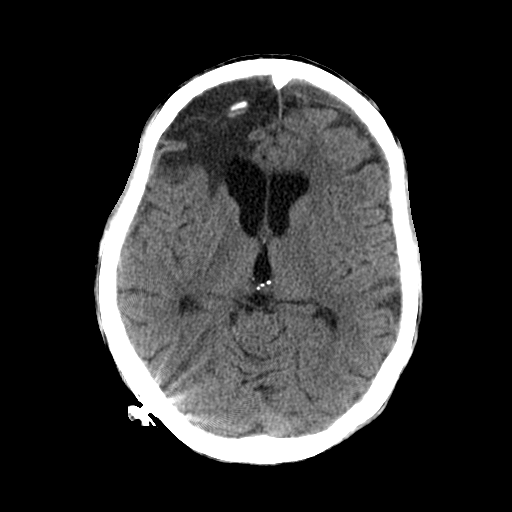
[im 14/28  brain]
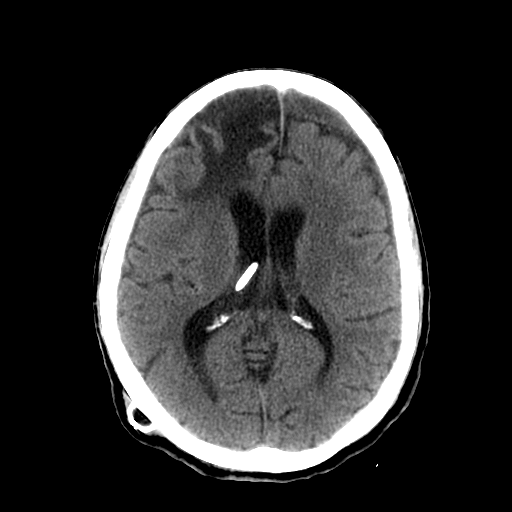
[im 16/28  brain]
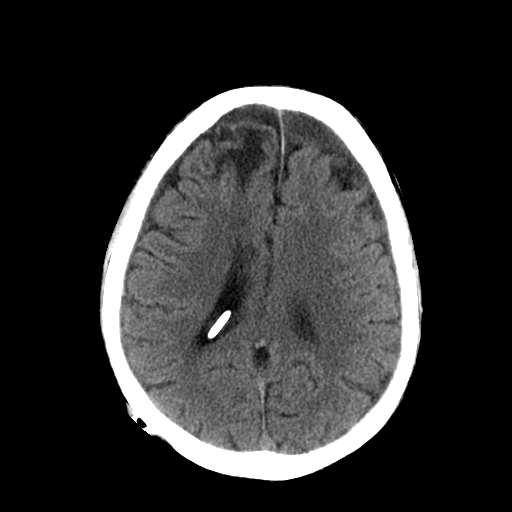
[im 18/28  brain]
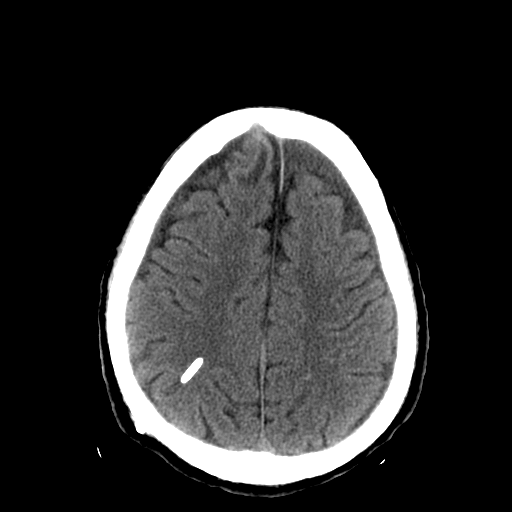
[im 18/28  bone]
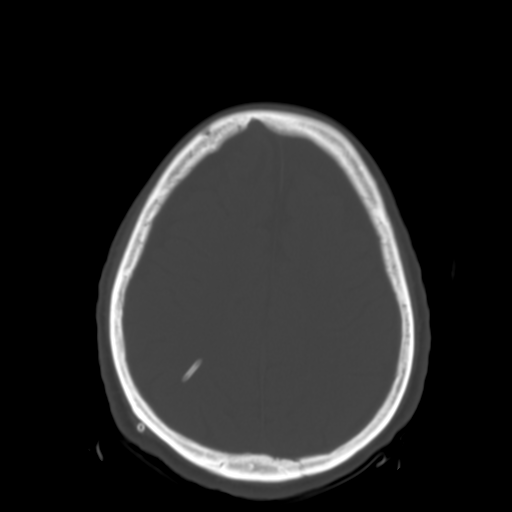
[im 20/28  brain]
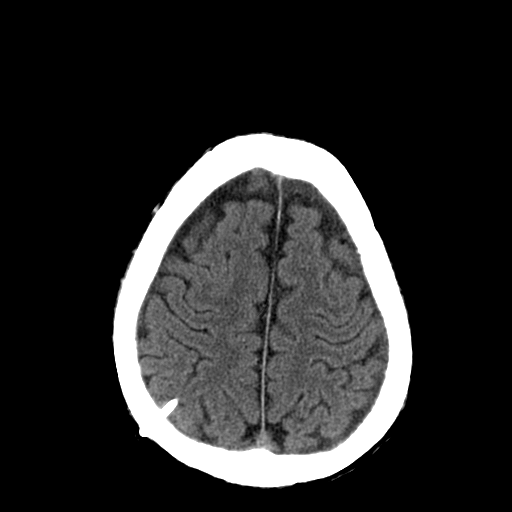
[im 22/28  brain]
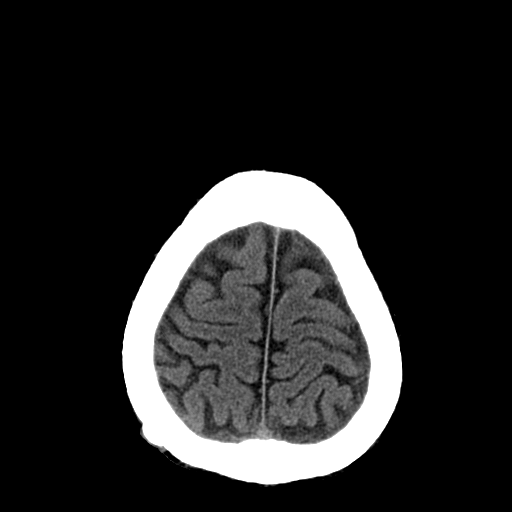
[im 24/28  brain]
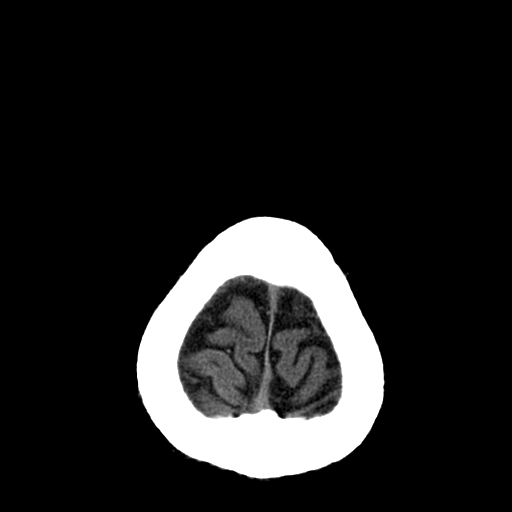
[im 26/28  brain]
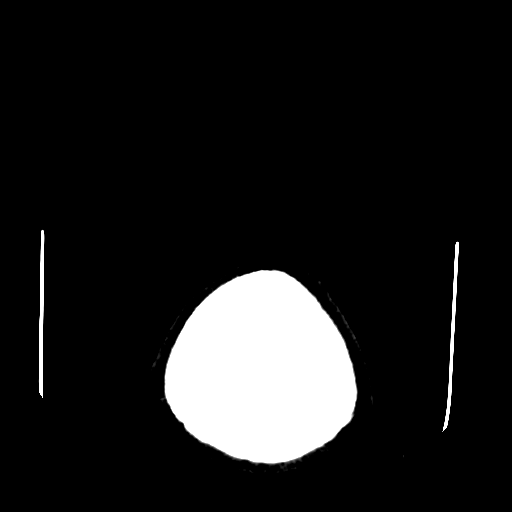
[im 26/28  bone]
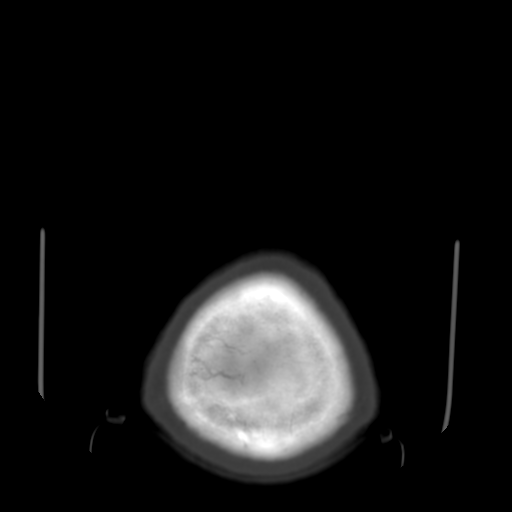

[13 of 30 positions shown; findings below may reference images not displayed]

FINDINGS: Acute/subacute infarction remains evident affecting the
right side the medulla and the inferior cerebellum on the right.
The areas show low density but there is no hemorrhage, significant
mass effect or shift.

Bifrontal encephalomalacia with dystrophic calcification on the
right appears the same.

Right posterior parietal VP shunt is the same, with the tip in the
right lateral ventricle and stable/normal ventricular size.

No extra-axial collection.  No mass lesion.  No acute calvarial
finding.  There is mild mucosal inflammation of the paranasal
sinuses.
IMPRESSION: Acute/subacute infarction within the right medulla and cerebellum.
No hemorrhage or significant mass effect/shift.  Ventricular size
is stable.

Chronic bifrontal encephalomalacia with dystrophic calcification on
the right.

## 2010-11-03 IMAGING — XA IR ANGIO/CAROTID/CERV BI
1 series · 13 of 24 positions shown · IV contrast (IODINE)
Comparison: MRI/MRA of the brain and neck of 10/14/2009.

CLINICAL DATA: Posterior fossa ischemic strokes.

BILATERAL CAROTID ARTERIOGRAPHY AND BILATERAL VERTEBRAL ARTERY
ANGIOGRAMS

[Series 300: neuro · 13 of 117 slices shown]
[im 1/117]
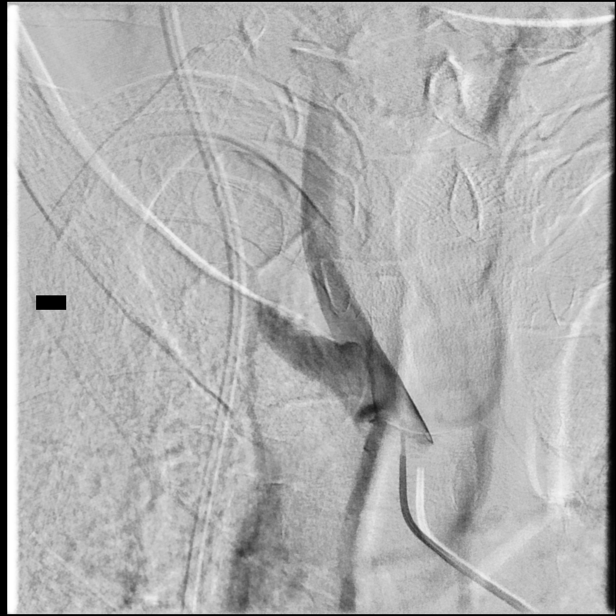
[im 11/117]
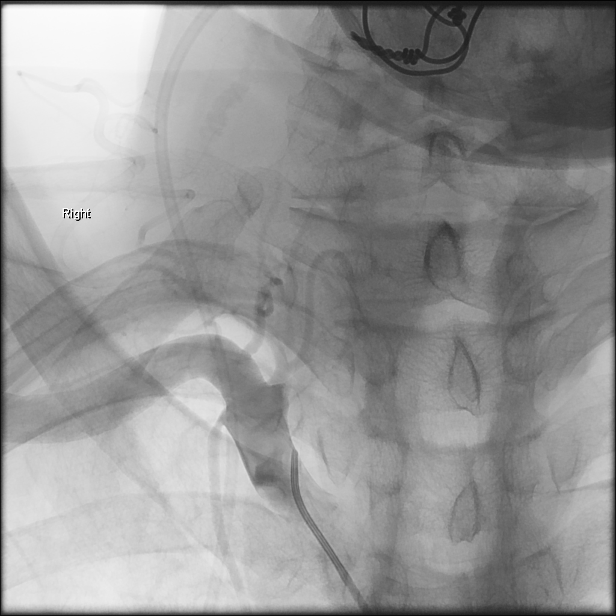
[im 21/117]
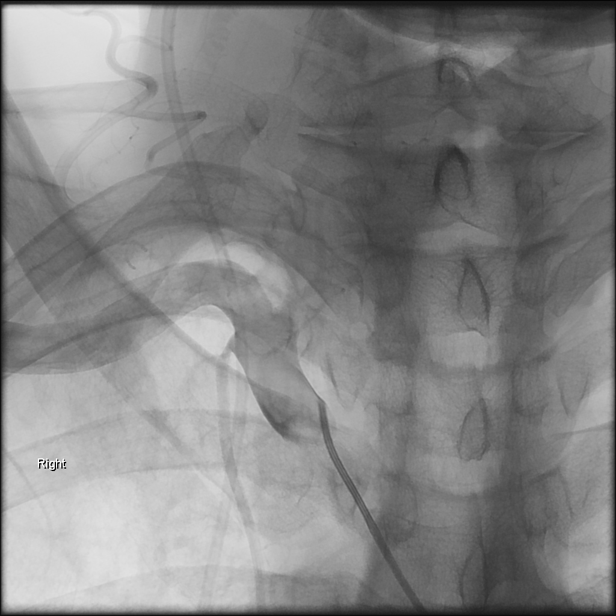
[im 31/117]
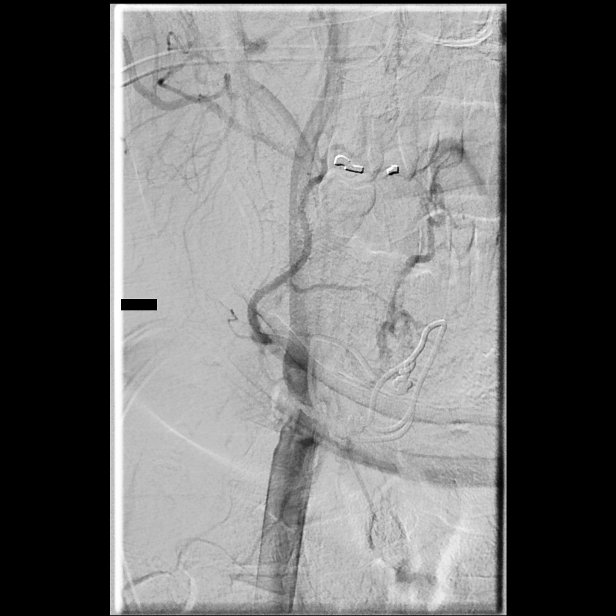
[im 41/117]
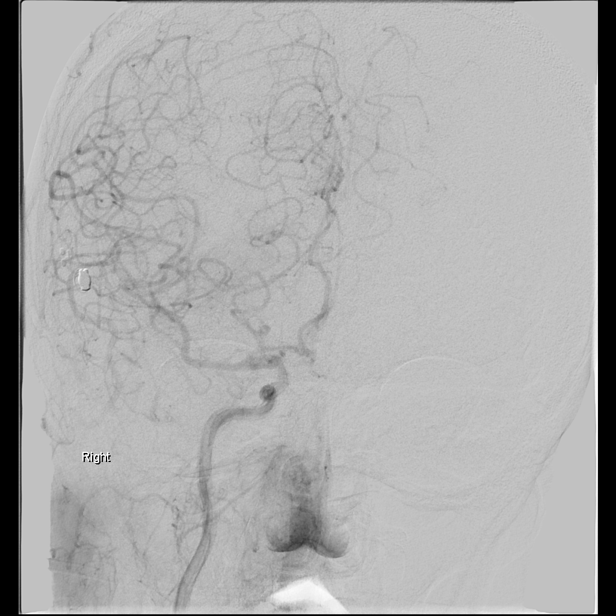
[im 51/117]
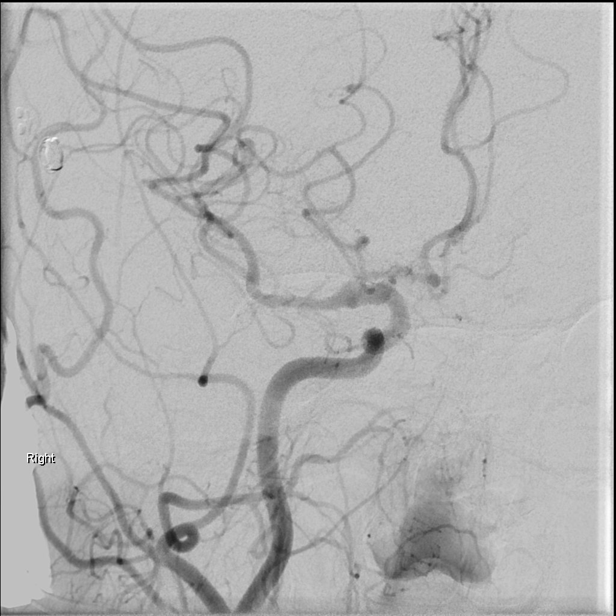
[im 61/117]
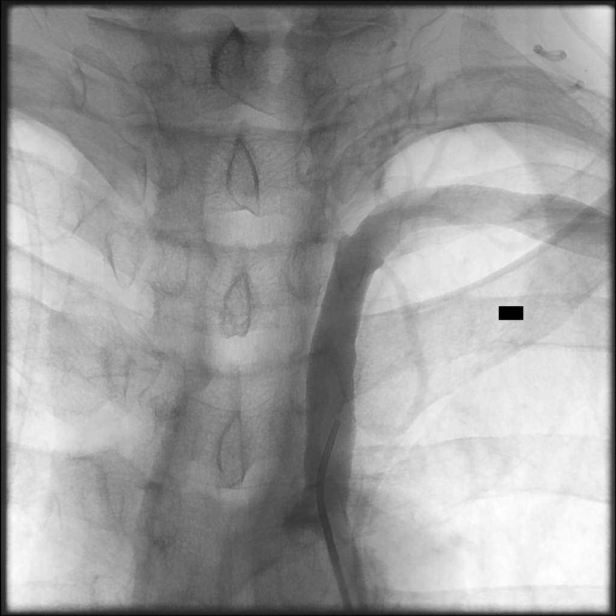
[im 66/117]
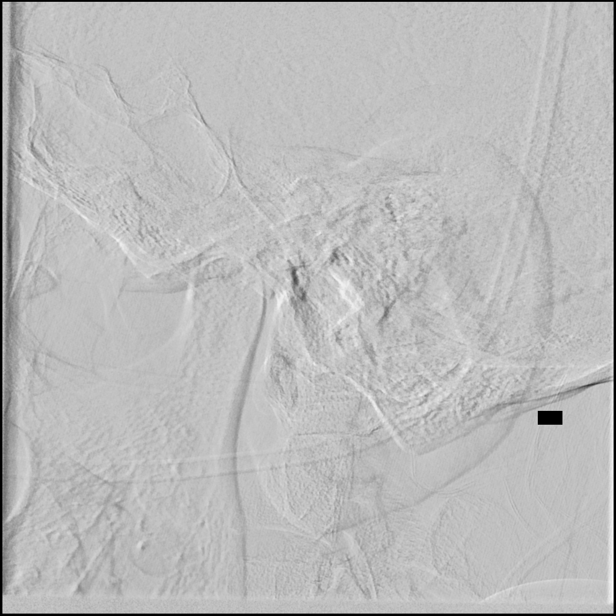
[im 76/117]
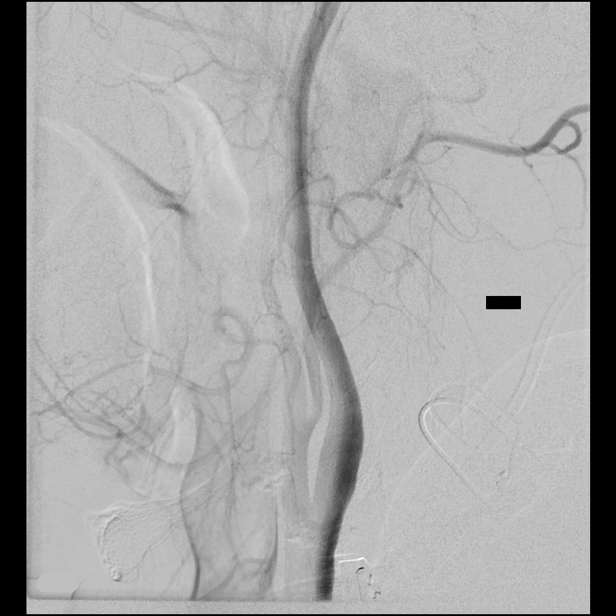
[im 86/117]
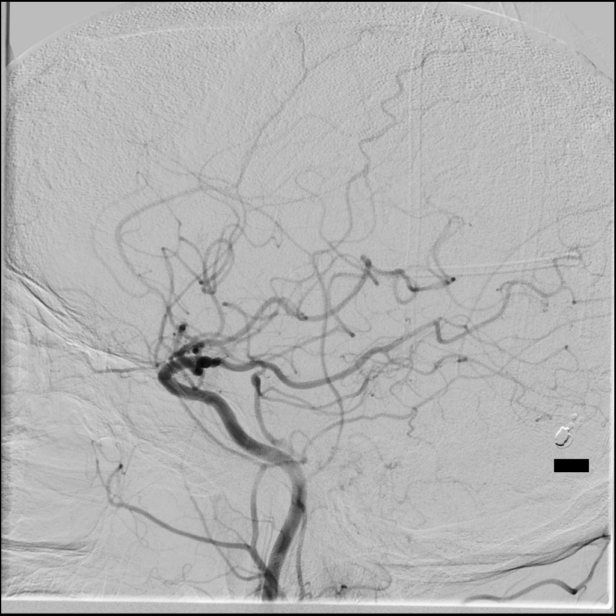
[im 96/117]
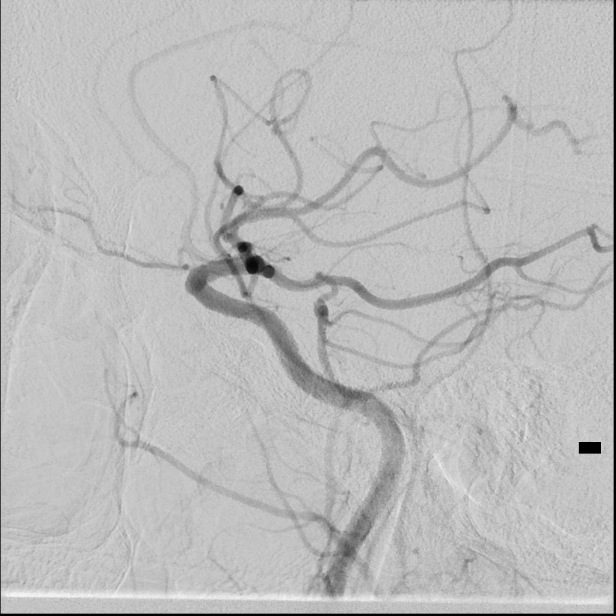
[im 106/117]
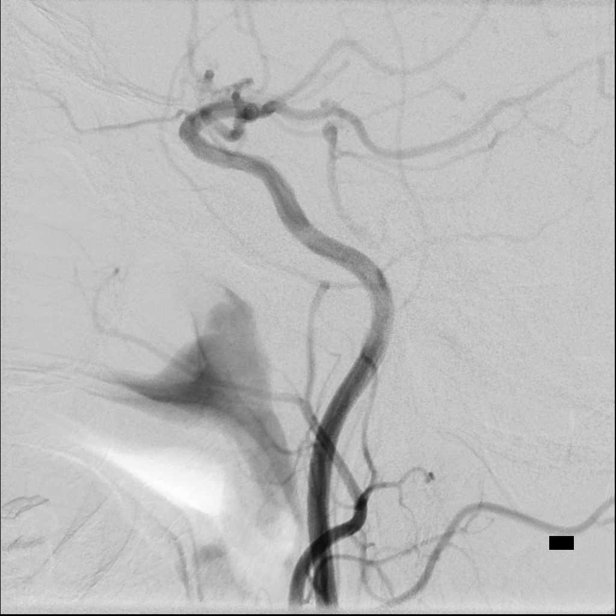
[im 117/117]
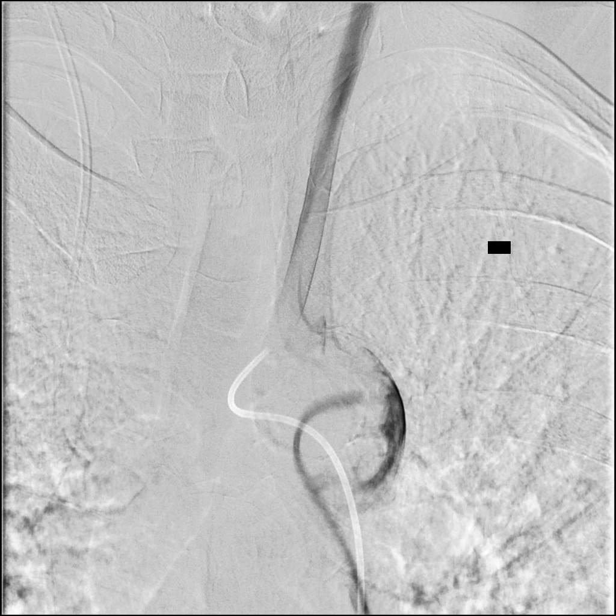

[13 of 24 positions shown; findings below may reference images not displayed]

Following a full explanation of the procedure along with the
potential associated complications, an informed witnessed consent
was obtained.

The right groin was prepped and draped in the usual sterile
fashion.  Thereafter using a modified Seldinger technique,
transfemoral access into the right common femoral artery was
obtained without difficulty.  Over a 0.035-inch guidewire, a 5-
French Pinnacle sheath was inserted.  Through this and also over a
0.035-inch guidewire, a 5-French JB1 catheter was advanced to the
aortic arch region and selectively positioned in the right
subclavian artery, the right common carotid artery, the left common
carotid artery and the left subclavian artery.

There were no acute complications.  The patient tolerated the
procedure well.

Medications utilized: Versed 1 mg IV.  Fentanyl 25 mcg IV.

Contrast: Omnipaque 300 approximately 65 ml.
FINDINGS: The right subclavian arteriogram demonstrates a
triangular stump at the origin of the right vertebral artery.

No angiographic opacification of this vessel is noted in the neck.

Distally there is no reconstitution from the thyrocervical or the
costocervical branches.

The right common carotid arteriogram demonstrates the right
external carotid artery and its major branches to be normal.

The right internal carotid artery at the bulb has a smooth shallow
calcified plaque along the posterior wall without any associated
stenosis or intraluminal irregularities.

The vessel is otherwise seen to opacify normally to the cranial
skull base.

The petrous, the cavernous and the supraclinoid segments are
normal. The right middle cerebral artery opacifies with mild
narrowing in the right M1 segment.

The trifurcation branches are normally opacified.

The right anterior cerebral artery also demonstrates mild narrowing
at its origin.

The vessel distal to this is normally opacified.  There is
transient cross opacification via the anterior communicating artery
of the left anterior cerebral artery distal to the A2 segment.

Also demonstrated is a dominant right posterior communicating
artery opacifying the right posterior cerebral artery and the right
superior cerebellar artery distributions.

The left subclavian arteriogram demonstrates a triangular stump at
the origin of the left vertebral artery.

More distally there is no reconstitution from the costocervical or
the thyrocervical trunks.

The left common carotid arteriogram demonstrates the left external
carotid artery and its major branches to be normal.  Attempt at
reconstitution of the left vertebral artery from the ipsilateral
left occipital artery is noted at the level of C1, with
opacification of the left posterior-inferior cerebellar artery also
noted.

The left internal carotid artery at the bulb has a circumferential
plaque with minimal stenosis by the NASCET criteria.  No
intraluminal filling defects are seen.

The vessel is otherwise seen to opacify normally to the cranial
skull base.

The petrous, the cavernous and the supraclinoid segments are
normal.

The left middle and the left anterior cerebral artery are seen to
opacify normally into capillary and venous phases.  Opacified blood
is also noted in the left anterior cerebral artery A2 segment from
the contralateral side as described above.

A dominant left posterior communicating artery is seen opacifying
the left posterior cerebral and superior cerebellar artery
distributions.

Also demonstrated is retrograde opacification of the basilar artery
to the level of the left anterior inferior cerebellar artery from
the left internal carotid artery via the left posterior
communicating artery.
IMPRESSION: 1.  Angiographically occluded both vertebral arteries at the
origins.
2.  Retrograde opacification of the basilar artery from the left
posterior communicating artery to the level of the left anterior
inferior cerebellar artery as described above.
3.  Partial reconstitution of the left vertebral artery at C1 with
opacification of the left posterior-inferior cerebellar artery from
the ipsilateral occipital artery.
4.   Mild stenosis of the right middle cerebral artery M1 segment,
probably secondary to intracranial arteriosclerosis.

## 2010-11-05 LAB — BASIC METABOLIC PANEL
BUN: 13 mg/dL (ref 6–23)
BUN: 2 mg/dL — ABNORMAL LOW (ref 6–23)
BUN: 7 mg/dL (ref 6–23)
CO2: 27 mEq/L (ref 19–32)
CO2: 28 mEq/L (ref 19–32)
Calcium: 8.5 mg/dL (ref 8.4–10.5)
Chloride: 108 mEq/L (ref 96–112)
Chloride: 110 mEq/L (ref 96–112)
GFR calc non Af Amer: 50 mL/min — ABNORMAL LOW (ref >60)
GFR calc non Af Amer: >60 mL/min (ref >60)
Glucose, Bld: 104 mg/dL — ABNORMAL HIGH (ref 70–99)
Glucose, Bld: 97 mg/dL (ref 70–99)
Potassium: 3.3 mEq/L — ABNORMAL LOW (ref 3.5–5.1)
Potassium: 3.4 mEq/L — ABNORMAL LOW (ref 3.5–5.1)
Sodium: 141 mEq/L (ref 135–145)
Sodium: 141 mEq/L (ref 135–145)
Sodium: 142 mEq/L (ref 135–145)

## 2010-11-05 LAB — BLOOD GAS, ARTERIAL
Acid-Base Excess: 0.4 mmol/L (ref 0.0–2.0)
Drawn by: 232811
FIO2: 0.3 %
MECHVT: 500 mL
O2 Saturation: 98.3 %
Patient temperature: 99.8
RATE: 12 resp/min
TCO2: 21.7 mmol/L (ref 0–100)
pCO2 arterial: 38.3 mmHg (ref 35.0–45.0)

## 2010-11-05 LAB — DIFFERENTIAL
Basophils Absolute: 0 10*3/uL (ref 0.0–0.1)
Eosinophils Absolute: 0.1 10*3/uL (ref 0.0–0.7)
Eosinophils Relative: 1 % (ref 0–5)
Lymphocytes Relative: 12 % (ref 12–46)
Lymphocytes Relative: 14 % (ref 12–46)
Lymphs Abs: 1.1 10*3/uL (ref 0.7–4.0)
Lymphs Abs: 1.2 10*3/uL (ref 0.7–4.0)
Monocytes Absolute: 0.6 10*3/uL (ref 0.1–1.0)
Monocytes Relative: 11 % (ref 3–12)
Monocytes Relative: 6 % (ref 3–12)
Neutro Abs: 8.4 10*3/uL — ABNORMAL HIGH (ref 1.7–7.7)
Neutrophils Relative %: 74 % (ref 43–77)

## 2010-11-05 LAB — CBC
HCT: 36.1 % — ABNORMAL LOW (ref 39.0–52.0)
HCT: 39.1 % (ref 39.0–52.0)
HCT: 39.6 % (ref 39.0–52.0)
Hemoglobin: 12 g/dL — ABNORMAL LOW (ref 13.0–17.0)
Hemoglobin: 13.1 g/dL (ref 13.0–17.0)
Hemoglobin: 13.6 g/dL (ref 13.0–17.0)
MCHC: 33.7 g/dL (ref 30.0–36.0)
MCHC: 34.3 g/dL (ref 30.0–36.0)
MCV: 94.4 fL (ref 78.0–100.0)
MCV: 95.2 fL (ref 78.0–100.0)
MCV: 95.2 fL (ref 78.0–100.0)
Platelets: 102 10*3/uL — ABNORMAL LOW (ref 150–400)
Platelets: 108 10*3/uL — ABNORMAL LOW (ref 150–400)
Platelets: 131 10*3/uL — ABNORMAL LOW (ref 150–400)
Platelets: 135 10*3/uL — ABNORMAL LOW (ref 150–400)
RDW: 14.3 % (ref 11.5–15.5)
RDW: 15 % (ref 11.5–15.5)
WBC: 10.2 10*3/uL (ref 4.0–10.5)
WBC: 7.9 10*3/uL (ref 4.0–10.5)

## 2010-11-05 LAB — COMPREHENSIVE METABOLIC PANEL
ALT: 15 U/L (ref 0–53)
Calcium: 8 mg/dL — ABNORMAL LOW (ref 8.4–10.5)
Creatinine, Ser: 1.04 mg/dL (ref 0.4–1.5)
GFR calc Af Amer: >60 mL/min (ref >60)
Glucose, Bld: 116 mg/dL — ABNORMAL HIGH (ref 70–99)
Sodium: 143 mEq/L (ref 135–145)
Total Protein: 5.3 g/dL — ABNORMAL LOW (ref 6.0–8.3)

## 2010-11-05 LAB — HEPATIC FUNCTION PANEL
ALT: 18 U/L (ref 0–53)
AST: 28 U/L (ref 0–37)
Alkaline Phosphatase: 47 U/L (ref 39–117)
Bilirubin, Direct: 0.1 mg/dL (ref 0.0–0.3)
Indirect Bilirubin: 0.2 mg/dL — ABNORMAL LOW (ref 0.3–0.9)
Total Bilirubin: 0.3 mg/dL (ref 0.3–1.2)

## 2010-11-05 LAB — URINALYSIS, ROUTINE W REFLEX MICROSCOPIC
Bilirubin Urine: NEGATIVE
Ketones, ur: NEGATIVE mg/dL
Nitrite: NEGATIVE
Specific Gravity, Urine: 1.021 (ref 1.005–1.030)
Urobilinogen, UA: 0.2 mg/dL (ref 0.0–1.0)

## 2010-11-05 LAB — RAPID URINE DRUG SCREEN, HOSP PERFORMED
Amphetamines: NOT DETECTED
Barbiturates: NOT DETECTED
Benzodiazepines: POSITIVE — AB
Tetrahydrocannabinol: NOT DETECTED

## 2010-11-05 LAB — ETHANOL: Alcohol, Ethyl (B): <5 mg/dL (ref 0–10)

## 2010-11-05 LAB — VALPROIC ACID LEVEL: Valproic Acid Lvl: <10 ug/mL — ABNORMAL LOW (ref 50.0–100.0)
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
## 2010-12-11 NOTE — Discharge Summary (Signed)
NAME:  Miguel [name], WOOLSTENHULME NO.:  [id number]   MEDICAL RECORD NO.:  [id number]          PATIENT TYPE:  INP   LOCATION:  1412                         FACILITY:  [facility]   PHYSICIAN:  Fleet Contras, M.D.    DATE OF BIRTH:  February 25, 1951   DATE OF ADMISSION:  01/03/2009  DATE OF DISCHARGE:  01/10/2009                               DISCHARGE SUMMARY   HISTORY OF PRESENT ILLNESS:  Mr. Chaloux is a 60 year old African  American gentleman with past medical history significant for alcohol  abuse, seizure disorder, and traumatic brain injury who was admitted via  the emergency room at [facility] after he developed a tonic-  clonic seizure at home.  Apparently he was in jail for about 4 days and  was not able to take his seizure medicine and had been drinking alcohol.  At the emergency room he was unresponsive and unconscious and had to be  intubated for airway protection and was transferred to the intensive  care unit of the hospital.  He received sedation for refractory seizures  and was placed on delirium tremens therapy with Ativan.  Symptoms  improved.  He was loaded with Dilantin and continued on it intravenously  until seizures were well-controlled.  He was eventually extubated on  January 05, 2009 and then transferred to my care to continue treatment.  When I saw him on January 06, 2009 he was extubated, awake, alert and  coherent.  His vital signs were stable.  He has not had any further  seizures.  His Dilantin level was 12.3 and this was converted to oral  therapy as well as the Ativan protocol.  He was then transferred to a  telemetry medical bed and he was monitored closely for next few days.  He was able to tolerate full diet, was ambulating with physical therapy  and is now considered stable for discharge home.  This morning he is  tolerating full diet.  He is ambulant in the hallway.   DISCHARGE PHYSICAL EXAMINATION:  VITAL SIGNS:  Blood pressure is 122/77,  heart  rate of 67 regular, respiratory rate of 16, temperature 98.1, O2  saturations on room air is 96%.  GENERAL:  He is not pale.  He is not icteric.  He is not cyanosed.  He  is well-hydrated.  NECK:  Supple with no elevated JVD, no cervical lymphadenopathy.  CHEST:  Shows good air entry bilaterally with no rales, rhonchi or  wheezes.  HEART:  S1 and S2 are heard with no murmurs.  No S3 gallop.  ABDOMEN:  Flat, soft, nontender, no masses.  Bowel sounds present.  EXTREMITIES:  Showed no edema, cyanosis or swelling.  CNS:  He is alert and oriented x3 with no focal deficits.   LABORATORY DATA:  His latest laboratory data on January 07, 2009 shows  sodium of 142, potassium 3.8, chloride 107, bicarbonate of 28, BUN 7,  creatinine 0.73, glucose of 97, calcium 8.5, white count was 7.7,  hemoglobin 13.6, hematocrit 39 and platelet count of 135.  Dilantin  level was 18.4.  A  CT scan of the head done on admission on January 03, 2009  showed atrophy with stable ventricular morphology and right parietal VP  shunt.  Bilateral frontal encephalomalacia with right greater than left.  There was a small chronic left frontal subdural collection.  There is no  acute infarct, mass lesion or intraparenchymal hemorrhage.  There was no  midline shift or mass effect.   DISCHARGE DIAGNOSES:  1. Seizure disorder.  2. Alcohol abuse.  3. Alcohol withdrawal.  4. Traumatic brain injury by history.   CONDITION ON DISCHARGE:  Stable.   DISPOSITION:  Is for home discharge.   MEDICATIONS:  1. Dilantin 300 mg one p.o. q.h.s.  2. Depakote 250 mg b.i.d.  3. Jantoven 7.5 mg on Tuesday, Thursday, Saturday and Sunday and 6 mg      daily.  4. Methylphenidate 10 mg daily.  5. Namenda 10 mg b.i.d.  6. Seroquel 50 mg q.h.s.  7. Zoloft 100 mg daily.  8. Aricept 5 mg daily.   Follow-up with me in the office in 1-2 weeks.  Discharge plan discussed  with him and his partner and their questions answered.      Fleet Contras,  M.D.  Electronically Signed     EA/MEDQ  D:  01/10/2009  T:  01/10/2009  Job:  [id number]

## 2010-12-14 NOTE — Discharge Summary (Signed)
Miguel [name], Miguel [name]                  ACCOUNT NO.:  [id number]   MEDICAL RECORD NO.:  [id number]          PATIENT TYPE:  INP   LOCATION:  3036                         FACILITY:  [facility]   PHYSICIAN:  Michaelyn Barter, M.D. DATE OF BIRTH:  [birth year]   DATE OF ADMISSION:  07/15/2006  DATE OF DISCHARGE:                               DISCHARGE SUMMARY   INTERIM DISCHARGE SUMMARY   Date of discharge is yet to be determined.  This is an interim discharge  summary that follows those discharge summaries by Dr. Michaelyn Barter  on July 15, 2006, and by Dr. Marcellus Scott on July 26, 2006.  For the events that have occurred over the course of the patient's  hospitalization, please refer to both of those discharge summaries.  This discharge summary will outline those events that have occurred  during the time frame  of July 28, 2006, up until July 04, 2006.  Final discharge diagnoses with regards to:  1. Bilateral lower extremity extensive deep venous thrombosis with      thrombus in the inferior vena cava.  The patient has not complained      of any leg pain throughout the course of this past week.  He      remains on anticoagulation. He has been hemodynamically stable over      the past 7 days.  2. Chronic pulmonary embolism.  The patient has remained on Coumadin      over the past 7 days.  He has not complained  chest pain or      shortness of breath over the past 7 days.  3. Blood loss anemia.  This has been stable over the past 7 days.  4. Failure to thrive.  The patient's activity level is limited by his      past medical history of head trauma with resulting intracranial      hemorrhage.  Because of that, the patient's ability to talk is      compromised.  The patient will talk, however, the words that he      speaks are very limited and his response to questions is slow.  At      times, the patient may not even respond to questions.  5. Deconditioning.  The pt was in the  process of undergoing      rehabilitation prior to this admissionl.  His activity level has      been minimal throughout his hospitalization.  I suspect the patient      may need aggressive physical therapy and occupational therapy after      being discharged from the hospital.   SECONDARY DIAGNOSES:  Remote history of head trauma.  Dr. Jordan Likes, the  patient's neurosurgeon, had been consulted during the earlier portion of  this hospitalization and, during that consultation, he indicated that  once the patient is discharged from the hospital, he would like to see  the patient in his office, at which time, he would consider the  placement of a ventriculoperitoneal shunt.   This brings me up to August 04, 2006.  At  this particular time, the  patient appears to be medically stable for discharge from the hospital.  The patient's medications at the time of discharge will be:  1. Wellbutrin 75 mg p.o. b.i.d.  2. Nu-Iron 150 mg p.o. daily.  3. Methylphenidate 10 mg p.o. daily.  4. Protonix 40 mg p.o. daily.  5. Senokot-S 1 tablet p.o. nightly.  6. Coumadin 5 mg p.o. daily.      Michaelyn Barter, M.D.  Electronically Signed     OR/MEDQ  D:  08/05/2006  T:  08/05/2006  Job:  [id number]

## 2010-12-14 NOTE — Discharge Summary (Signed)
Miguel [name], FIX NO.:  [id number]   MEDICAL RECORD NO.:  [id number]          PATIENT TYPE:  INP   LOCATION:  3036                         FACILITY:  [facility]   PHYSICIAN:  Marcellus Scott, MD     DATE OF BIRTH:  07/01/1951   DATE OF ADMISSION:  07/15/2006  DATE OF DISCHARGE:                               DISCHARGE SUMMARY   ADDENDUM:  This is an addendum discharge summary, which will only  outline the events of this patient's inpatient care from July 21, 2006 until to date.  For details of his care prior to that please refer  to my history and physical note on July 15, 2006 and the interim  discharge summary that was done by Dr. Michaelyn Barter on July 20, 2006.   PRIMARY CARE PHYSICIAN:  Unassigned to the [facility].   DISCHARGE DIAGNOSES:  1. Bilateral lower extremity extensive deep vein thrombosis with      thrombus in the inferior vena cava filter.  2. Chronic pulmonary embolism.  3. Blood loss anemia.  4. Failure to thrive.  5. Status post head injury.   DISCHARGE MEDICATIONS:  Discharge medications are to be determined  during the time of discharge.   PROCEDURE PERFORMED:  On July 21, 2006, Doppler bilateral lower  extremities summary is bilateral extensive deep vein thrombosis noted  throughout the leg.  Right:  Minute to minimal flow noted.  Superficial  thrombosis of the lesser saphenous vein.  Left:  Minimal to mild  recanalization of the veins of the thigh with dilation of the collateral  veins.  Minimal flow also noted in the popliteal and distal veins.   CONSULTATIONS:  Interventional radiology, Dr. Deanne Coffer and Dr. Fredia Sorrow.   HOSPITAL COURSE:  1. Bilateral lower extremity DVT's with clot in IVC filter: Since      July 21, 2006, patient has been in usual state of minimal      words.  All he said initially was that he was unable to walk but      denied any pain.  He was monitored and followed up by  pharmacy for      his anticoagulation.  He has been on a unfractionated heparin drip      as well as Coumadin until the INR was therapeutic.  The patient was      noted to have markedly swollen left lower extremity.      Interventional radiology was contacted regarding the thrombectomy      that they had suggested earlier.  They requested Doppler of the      lower extremities, which was done and reported as above, following      which they performed the bilateral lower extremity thrombectomy.      Please refer to the detailed procedure note.  The procedure was on      July 24, 2006.  Following the procedure, patient transiently      dropped his oxygen saturation to 88%, following which the patient      was transferred to stepdown; however, an ABG  done was noted to be      ok.  In the step down, patient has progressively done well, looks      stronger, says he feels stronger, has been feeling better too.      Patient has mild soreness in both his lower extremities.  The sites      of the thrombectomy look clean without any evidence of hematoma.      He has PAS hose on bilateral lower extremities as advised by      interventional radiology.  The lower extremity swelling has almost      resolved in both lower extremities.  Patient at this time is      anticoagulated with an INR of 2, his hemoglobin/hematocrit is 12      and 36.  Patient does not show any significant events on the      telemonitor.  Patient will be transferred out of the stepdown unit      today and he is to continue anticoagulation with Coumadin probably      for 3 to 6 months, during which time his INR will have to be      carefully monitored and so also his hemoglobin and hematocrit.   1. Chronic pulmonary embolism: He is status post IVC filter and is to      continue anticoagulation.   1. Anemia: As indicated earlier, his anemia has been stable not      requiring any transfusions.  He is on iron sulfate  supplements.   1. Failure to thrive: His failure to thrive is probably multi      factorial secondary to his prior head injury and overall change in      mental status as well as his acute presentation of abdominal pain      and bilateral lower extremity DVT.  At this time with the      resolution of the leg swellings, he already seems to be feeling      better and hopefully this should improve.   1. Status post head injury.  Unsure of what the patient's baseline      mental status was prior to the head injury; however, at this time      patient is awake and alert, oriented in time, person, and place,      but intermittently since admission, he has not been talking much at      times and just looking at you or watching the TV and not answering      questions appropriately.   The patient is referred to the community [facility] center to try and obtain  a physician if he does not get transferred to a nursing facility.      Marcellus Scott, MD  Electronically Signed     AH/MEDQ  D:  07/26/2006  T:  07/27/2006  Job:  [id number]

## 2010-12-14 NOTE — Op Note (Signed)
NAME:  Miguel [name], Miguel [name]                  ACCOUNT NO.:  [id number]   MEDICAL RECORD NO.:  [id number]          PATIENT TYPE:  INP   LOCATION:  5710                         FACILITY:  [facility]   PHYSICIAN:  John C. Madilyn Fireman, M.D.    DATE OF BIRTH:  1950/11/25   DATE OF PROCEDURE:  07/18/2006  DATE OF DISCHARGE:                               OPERATIVE REPORT   Esophagogastroduodenoscopy.   INDICATIONS FOR PROCEDURE:  Abdominal pain and anemia.   PROCEDURE:  The patient was placed in the left lateral decubitus  position and placed on the pulse monitor with continuous low-flow oxygen  delivered by nasal cannula.  He was sedated with 50 mcg IV fentanyl and  5 mg IV Versed.  The Olympus video endoscope was advanced under direct  vision into the oropharynx and esophagus.  The esophagus was straight  and of normal caliber, the squamocolumnar line at 38 cm.  There was no  visible hiatal hernia, ring, stricture or other abnormality of the GE  junction.  The stomach was entered, and a small amount of liquid  secretions were suctioned from the fundus.  Retroflexed view of cardia  was unremarkable.  The fundus, body, antrum and pylorus all appeared  normal.  The duodenum was entered, and the both bulb and second portion  were well inspected and appeared to be within normal limits as well.  Scope was then withdrawn, and the patient returned to the recovery room  in stable condition.  He tolerated the procedure well, and there were no  immediate complications.   IMPRESSION:  Normal study.   PLAN:  Probably needs colonoscopy at some time due to his anemia.  Would  proceed with this after recovery from his acute illness, probably as an  outpatient           ______________________________  Everardo All. Madilyn Fireman, M.D.     JCH/MEDQ  D:  07/18/2006  T:  07/19/2006  Job:  [id number]

## 2010-12-14 NOTE — Discharge Summary (Signed)
Miguel [name], SCHEEL                  ACCOUNT NO.:  [id number]   MEDICAL RECORD NO.:  [id number]          PATIENT TYPE:  INP   LOCATION:  5006                         FACILITY:  [facility]   PHYSICIAN:  Nadara Mustard, MD     DATE OF BIRTH:  11/06/1950   DATE OF ADMISSION:  09/27/2004  DATE OF DISCHARGE:  10/01/2004                                 DISCHARGE SUMMARY   DIAGNOSIS:  Right forearm wound, status post irrigation and debridement.   DISPOSITION:  Discharged to home in stable condition and to follow up in  office in 2 weeks.   HISTORY OF PRESENT ILLNESS:  The patient is a 60 year old gentleman who  states he was trying to jump over a wall and sustained a large laceration to  his right forearm.  Blood work was positive for multiple substance abuse. On  examination the patient had a large forearm laceration measuring 10 x 10 x 5  cm.  The patient did move his fingers well, he had absent sensation of the  little finger.  Radiographs were negative for fracture.   ASSESSMENT:  The patient had a large soft tissue laceration to the right  forearm.   HOSPITAL COURSE:  The patient initially underwent irrigation and debridement  of the skin, soft tissue and muscle in the ER.  He received a dT shot and  was started on Kefzol for infection prophylaxis.  The patient's hemoglobin  dropped to 7.0 and he was typed and crossed and transfused with 2 units of  packed red blood cells.  The patient returned to the operating room for  repeat irrigation and debridement of the skin, soft tissue and muscle and  closure of the complex wound laceration.  He received Kefzol and gentamicin  preoperatively, and was transferred to the recovery room in stable  condition.  The patient progressed well postoperatively, he received  antibiotics, including Kefzol and gentamicin.  The patient was felt to be  stable for discharge to home on October 01, 2004, and he was discharged to home  in stable condition with followup  in office in 2 weeks.      MVD/MEDQ  D:  12/05/2004  T:  12/05/2004  Job:  [id number]
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
## 2010-12-14 NOTE — Consult Note (Signed)
NAME:  Miguel [name], Miguel [name]                  ACCOUNT NO.:  [id number]   MEDICAL RECORD NO.:  [id number]          PATIENT TYPE:  INP   LOCATION:  5710                         FACILITY:  [facility]   PHYSICIAN:  John C. Madilyn Fireman, M.D.    DATE OF BIRTH:  July 26, 1951   DATE OF CONSULTATION:  07/17/2006  DATE OF DISCHARGE:                                 CONSULTATION   REASON FOR CONSULTATION:  Black stool and drop in hemoglobin.   HISTORY OF PRESENT ILLNESS:  Mr. Frankl is a 60 year old African  American male who recently suffered a subdural hematoma with brain  damage in October 2007.  He and his wife report increased abdominal pain  and complaints throughout the night as well as after eating.  His wife  also reports that he was a heavy straight alcohol drinker prior to his  injury.  His wife says she has noticed black diarrhea at home on one  occasion, approximately 12:15.  He has no trouble swallowing.  He has a  good appetite.  His hemoglobin has dropped from 11.4 on December 17 to  8.8 on December 20.  He is positive for bacteremia, positive for  thrombus in his IVC filter, positive for thrombus in his iliac vein.   PAST MEDICAL HISTORY:  1. Brain injury, skull fracture, subdural hematoma.  2. Zygomatic fracture.  3. IVC filter placed in October 2007.  4. History of alcohol abuse.  5. History of tobacco abuse.   MEDICATIONS AT HOME:  1. Ritalin 10 mg once a day.  2. Amoxicillin.  3. Bupropion.   ALLERGIES:  NO KNOWN DRUG ALLERGIES.   SOCIAL HISTORY:  Currently negative for alcohol.  Negative tobacco.   PHYSICAL EXAMINATION:  GENERAL:  He is awake, oriented to self but not  to place or time.  This gentleman also looks rather cachectic.  CARDIOVASCULAR:  Regular rate and rhythm.  LUNGS:  Clear to auscultation anteriorly.  ABDOMEN:  His abdomen is very thin.  He has positive bowel sounds.  He  is guarding and extremely tender in his left lower quadrant as well as  right lower quadrant.  He  is also somewhat tender in his epigastric  area.  RECTAL:  he was guaiac negative.  Also note that rectal exam was very  painful to the patient.   LABORATORY DATA:  CBC:  Hemoglobin 8.8 pack of 26.6, white count 6.1 and  platelets 128.  Chem-7 is within normal limits.  LFTs were within normal  limits on 12/19.  Iron is low at 16, TIBC low at 165, percent sat is low  at 10 and ferritin of 719.  On December 19, he had a CT of his abdomen  and pelvis that showed a thrombus in his IVC as well as a thrombus in  his iliac vein.   ASSESSMENT:  This is a 60 year old male with a drop in hemoglobin,  possibly due to a gastric ulcer.  The patient was seen and examined by  Dr. Dorena Cookey who collected the history, and his plan of care is to try  to  determine the etiology of drop in hemoglobin with an endoscopy  scheduled for July 18, 2006.  And very much for this consultation.  Please send a copy of this note to Dr. Dorena Cookey as well as an  Compazine D      Stephani Police, PA    ______________________________  Everardo All. Madilyn Fireman, M.D.    MLY/MEDQ  D:  07/17/2006  T:  07/18/2006  Job:  [id number]   cc:   Everardo All. Madilyn Fireman, M.D.  [facility]
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
## 2011-04-19 LAB — PROTIME-INR: INR: 1

## 2011-04-19 LAB — ETHANOL: Alcohol, Ethyl (B): 328 — ABNORMAL HIGH

## 2011-04-19 LAB — CBC
Hemoglobin: 13.3
MCHC: 33.7
RBC: 4.27
WBC: 6.2

## 2011-04-19 LAB — BASIC METABOLIC PANEL
Calcium: 8.8
Creatinine, Ser: 0.9
GFR calc Af Amer: >60
GFR calc non Af Amer: >60
Sodium: 148 — ABNORMAL HIGH

## 2011-04-19 LAB — VALPROIC ACID LEVEL: Valproic Acid Lvl: 11.8 — ABNORMAL LOW

## 2011-04-19 LAB — URINALYSIS, ROUTINE W REFLEX MICROSCOPIC
Nitrite: NEGATIVE
Specific Gravity, Urine: 1.003 — ABNORMAL LOW
Urobilinogen, UA: 0.2
pH: 7

## 2011-04-19 LAB — DIFFERENTIAL
Basophils Relative: 1
Lymphocytes Relative: 43
Lymphs Abs: 2.7
Monocytes Relative: 8
Neutro Abs: 2.9
Neutrophils Relative %: 46

## 2011-04-19 LAB — RAPID URINE DRUG SCREEN, HOSP PERFORMED
Amphetamines: NOT DETECTED
Opiates: NOT DETECTED
Tetrahydrocannabinol: NOT DETECTED

## 2011-05-03 LAB — I-STAT 8, (EC8 V) (CONVERTED LAB)
BUN: 9
Bicarbonate: 22.2
HCT: 46
Operator id: 270651
TCO2: 23
pCO2, Ven: 34.6 — ABNORMAL LOW

## 2011-05-03 LAB — POCT I-STAT CREATININE: Creatinine, Ser: 1.1

## 2011-05-03 LAB — ETHANOL: Alcohol, Ethyl (B): <5

## 2011-05-08 LAB — POCT CARDIAC MARKERS: CKMB, poc: 2.3

## 2011-05-08 LAB — I-STAT 8, (EC8 V) (CONVERTED LAB)
Acid-base deficit: 4 — ABNORMAL HIGH
Chloride: 108
Potassium: 4.1
pH, Ven: 7.313 — ABNORMAL HIGH

## 2011-05-08 LAB — PROTIME-INR: Prothrombin Time: 11.9

## 2011-05-08 LAB — ETHANOL: Alcohol, Ethyl (B): <5

## 2011-05-08 LAB — POCT I-STAT CREATININE: Creatinine, Ser: 1

## 2011-05-09 LAB — COMPREHENSIVE METABOLIC PANEL
ALT: 20
AST: 30
Albumin: 3.3 — ABNORMAL LOW
Alkaline Phosphatase: 61
GFR calc Af Amer: >60
Potassium: 4.3
Sodium: 142
Total Protein: 6.5

## 2011-05-09 LAB — DIFFERENTIAL
Basophils Relative: 1
Eosinophils Absolute: 0.3
Lymphs Abs: 1.5
Monocytes Absolute: 0.5
Monocytes Relative: 8

## 2011-05-09 LAB — ETHANOL: Alcohol, Ethyl (B): <5

## 2011-05-09 LAB — CBC
Hemoglobin: 13.3
Platelets: 189
RDW: 17.3 — ABNORMAL HIGH

## 2011-05-09 LAB — RAPID URINE DRUG SCREEN, HOSP PERFORMED
Amphetamines: NOT DETECTED
Benzodiazepines: NOT DETECTED
Cocaine: NOT DETECTED
Tetrahydrocannabinol: NOT DETECTED

## 2012-07-02 ENCOUNTER — Encounter (HOSPITAL_COMMUNITY): Payer: Self-pay | Admitting: *Deleted

## 2012-07-02 ENCOUNTER — Emergency Department (HOSPITAL_COMMUNITY)
Admission: EM | Admit: 2012-07-02 | Discharge: 2012-07-03 | Disposition: A | Discharge status: Home / Self Care | Payer: Medicare Other | Attending: Emergency Medicine | Admitting: Emergency Medicine

## 2012-07-02 DIAGNOSIS — Y929 Unspecified place or not applicable: Secondary | ICD-10-CM | POA: Insufficient documentation

## 2012-07-02 DIAGNOSIS — Y939 Activity, unspecified: Secondary | ICD-10-CM | POA: Insufficient documentation

## 2012-07-02 DIAGNOSIS — IMO0002 Reserved for concepts with insufficient information to code with codable children: Secondary | ICD-10-CM | POA: Insufficient documentation

## 2012-07-02 DIAGNOSIS — Z79899 Other long term (current) drug therapy: Secondary | ICD-10-CM | POA: Insufficient documentation

## 2012-07-02 DIAGNOSIS — W1809XA Striking against other object with subsequent fall, initial encounter: Secondary | ICD-10-CM | POA: Insufficient documentation

## 2012-07-02 DIAGNOSIS — F17200 Nicotine dependence, unspecified, uncomplicated: Secondary | ICD-10-CM | POA: Insufficient documentation

## 2012-07-02 DIAGNOSIS — R7889 Finding of other specified substances, not normally found in blood: Secondary | ICD-10-CM

## 2012-07-02 DIAGNOSIS — G40909 Epilepsy, unspecified, not intractable, without status epilepticus: Secondary | ICD-10-CM | POA: Insufficient documentation

## 2012-07-02 DIAGNOSIS — R892 Abnormal level of other drugs, medicaments and biological substances in specimens from other organs, systems and tissues: Secondary | ICD-10-CM | POA: Insufficient documentation

## 2012-07-02 DIAGNOSIS — I10 Essential (primary) hypertension: Secondary | ICD-10-CM | POA: Insufficient documentation

## 2012-07-02 DIAGNOSIS — S0081XA Abrasion of other part of head, initial encounter: Secondary | ICD-10-CM

## 2012-07-02 DIAGNOSIS — Z23 Encounter for immunization: Secondary | ICD-10-CM | POA: Insufficient documentation

## 2012-07-02 DIAGNOSIS — F1010 Alcohol abuse, uncomplicated: Secondary | ICD-10-CM | POA: Insufficient documentation

## 2012-07-02 DIAGNOSIS — S0990XA Unspecified injury of head, initial encounter: Secondary | ICD-10-CM | POA: Insufficient documentation

## 2012-07-02 DIAGNOSIS — Z982 Presence of cerebrospinal fluid drainage device: Secondary | ICD-10-CM | POA: Insufficient documentation

## 2012-07-02 HISTORY — DX: Unspecified convulsions: R56.9

## 2012-07-02 HISTORY — DX: Essential (primary) hypertension: I10

## 2012-07-02 LAB — COMPREHENSIVE METABOLIC PANEL
ALT: 27 U/L (ref 0–53)
AST: 23 U/L (ref 0–37)
Albumin: 3.9 g/dL (ref 3.5–5.2)
Alkaline Phosphatase: 84 U/L (ref 39–117)
BUN: 11 mg/dL (ref 6–23)
CO2: 24 mEq/L (ref 19–32)
Calcium: 9.3 mg/dL (ref 8.4–10.5)
Chloride: 105 mEq/L (ref 96–112)
Creatinine, Ser: 0.98 mg/dL (ref 0.50–1.35)
GFR calc Af Amer: >90 mL/min (ref >90)
GFR calc non Af Amer: 87 mL/min — ABNORMAL LOW (ref >90)
Glucose, Bld: 98 mg/dL (ref 70–99)
Potassium: 3.6 mEq/L (ref 3.5–5.1)
Sodium: 142 mEq/L (ref 135–145)
Total Bilirubin: 0.5 mg/dL (ref 0.3–1.2)
Total Protein: 7.1 g/dL (ref 6.0–8.3)

## 2012-07-02 LAB — PHENYTOIN LEVEL, TOTAL: Phenytoin Lvl: 4.8 ug/mL — ABNORMAL LOW (ref 10.0–20.0)

## 2012-07-02 LAB — RAPID URINE DRUG SCREEN, HOSP PERFORMED
Barbiturates: NOT DETECTED
Tetrahydrocannabinol: NOT DETECTED

## 2012-07-02 LAB — CBC WITH DIFFERENTIAL/PLATELET
Basophils Absolute: 0.1 10*3/uL (ref 0.0–0.1)
Basophils Relative: 1 % (ref 0–1)
Eosinophils Absolute: 0.2 10*3/uL (ref 0.0–0.7)
Eosinophils Relative: 4 % (ref 0–5)
HCT: 42.4 % (ref 39.0–52.0)
Hemoglobin: 14.4 g/dL (ref 13.0–17.0)
Lymphocytes Relative: 40 % (ref 12–46)
Lymphs Abs: 2.5 10*3/uL (ref 0.7–4.0)
MCH: 30.7 pg (ref 26.0–34.0)
MCHC: 34 g/dL (ref 30.0–36.0)
MCV: 90.4 fL (ref 78.0–100.0)
Monocytes Absolute: 0.6 10*3/uL (ref 0.1–1.0)
Monocytes Relative: 10 % (ref 3–12)
Neutro Abs: 2.9 10*3/uL (ref 1.7–7.7)
Neutrophils Relative %: 46 % (ref 43–77)
Platelets: 167 10*3/uL (ref 150–400)
RBC: 4.69 MIL/uL (ref 4.22–5.81)
RDW: 14.3 % (ref 11.5–15.5)
WBC: 6.2 10*3/uL (ref 4.0–10.5)

## 2012-07-02 LAB — URINALYSIS, ROUTINE W REFLEX MICROSCOPIC
Bilirubin Urine: NEGATIVE
Hgb urine dipstick: NEGATIVE
Ketones, ur: NEGATIVE mg/dL
Nitrite: NEGATIVE
Protein, ur: NEGATIVE mg/dL
Specific Gravity, Urine: 1.016 (ref 1.005–1.030)
Urobilinogen, UA: 0.2 mg/dL (ref 0.0–1.0)

## 2012-07-02 LAB — ETHANOL: Alcohol, Ethyl (B): <11 mg/dL (ref 0–11)

## 2012-07-02 MED ORDER — TETANUS-DIPHTH-ACELL PERTUSSIS 5-2.5-18.5 LF-MCG/0.5 IM SUSP
0.5000 mL | Freq: Once | INTRAMUSCULAR | Status: AC
  Administered 2012-07-02: 0.5 mL via INTRAMUSCULAR
  Filled 2012-07-02: qty 0.5

## 2012-07-02 MED ORDER — PHENYTOIN SODIUM EXTENDED 100 MG PO CAPS
400.0000 mg | ORAL_CAPSULE | Freq: Once | ORAL | Status: AC
  Administered 2012-07-02: 400 mg via ORAL
  Filled 2012-07-02: qty 4

## 2012-07-02 MED ORDER — ACETAMINOPHEN 325 MG PO TABS
650.0000 mg | ORAL_TABLET | Freq: Once | ORAL | Status: AC
  Administered 2012-07-02: 650 mg via ORAL
  Filled 2012-07-02: qty 2

## 2012-07-02 NOTE — ED Notes (Signed)
The pt has a headache and he has a v-p shunt

## 2012-07-02 NOTE — ED Notes (Signed)
The pt has a history of seizures and his last seizure was this am.  He drinks alcohol every day and he has not been taking his seizure med. Marland Kitchen

## 2012-07-03 ENCOUNTER — Emergency Department (HOSPITAL_COMMUNITY): Payer: Medicare Other

## 2012-07-03 MED ORDER — PHENYTOIN SODIUM EXTENDED 100 MG PO CAPS
400.0000 mg | ORAL_CAPSULE | Freq: Once | ORAL | Status: AC
  Administered 2012-07-03: 400 mg via ORAL
  Filled 2012-07-03: qty 4

## 2012-07-03 MED ORDER — PHENYTOIN SODIUM EXTENDED 100 MG PO CAPS: 300.0000 mg | ORAL_CAPSULE | Freq: Every day | ORAL | Status: DC

## 2012-07-03 NOTE — ED Provider Notes (Signed)
History     CSN: [id number]  Arrival date & time 07/02/12  2001   First MD Initiated Contact with Patient 07/02/12 2255      Chief Complaint  Patient presents with  . Seizures    (Consider location/radiation/quality/duration/timing/severity/associated sxs/prior treatment) HPI 61 yo male presents to the ER with complaint of seizure this morning.  Pt has h/o seizure disorder, ran out of his dilantin 3 days ago.  Pt also reports drinking heavily 2 days ago, and he fell, striking his head.  He denies LOC.  Unknown last tetanus.  Pt reports he drinks often, but denies daily drinking, he denies withdrawal sxs if he does not drink.  No neck pain, no n/v. Pt does not have a doctor.  Past Medical History  Diagnosis Date  . Hx of ventricular shunt   . Seizures   . Hypertension     History reviewed. No pertinent past surgical history.  No family history on file.  History  Substance Use Topics  . Smoking status: Current Every Day Smoker  . Smokeless tobacco: Not on file  . Alcohol Use: Yes      Review of Systems  All other systems reviewed and are negative.    Allergies  Review of patient's allergies indicates not on file.  Home Medications   Current Outpatient Rx  Name  Route  Sig  Dispense  Refill  . PHENYTOIN SODIUM EXTENDED 100 MG PO CAPS   Oral   Take 3 capsules (300 mg total) by mouth at bedtime.   30 capsule   0     BP 151/86  Pulse 68  Temp 98.2 F (36.8 C) (Oral)  Resp 16  SpO2 95%  Physical Exam  Nursing note and vitals reviewed. Constitutional: He is oriented to person, place, and time. He appears well-developed and well-nourished.  HENT:  Head: Normocephalic.  Right Ear: External ear normal.  Left Ear: External ear normal.  Nose: Nose normal.  Mouth/Throat: Oropharynx is clear and moist.       Abrasion left forehead  Eyes: Conjunctivae normal and EOM are normal. Pupils are equal, round, and reactive to light.  Neck: Normal range of motion.  Neck supple. No JVD present. No tracheal deviation present. No thyromegaly present.  Cardiovascular: Normal rate, regular rhythm, normal heart sounds and intact distal pulses.  Exam reveals no gallop and no friction rub.   No murmur heard. Pulmonary/Chest: Effort normal and breath sounds normal. No stridor. No respiratory distress. He has no wheezes. He has no rales. He exhibits no tenderness.  Abdominal: Soft. Bowel sounds are normal. He exhibits no distension and no mass. There is no tenderness. There is no rebound and no guarding.  Musculoskeletal: Normal range of motion. He exhibits no edema and no tenderness.  Lymphadenopathy:    He has no cervical adenopathy.  Neurological: He is alert and oriented to person, place, and time. He has normal reflexes. No cranial nerve deficit. He exhibits normal muscle tone. Coordination normal.  Skin: Skin is dry. No rash noted. No erythema. No pallor.  Psychiatric: He has a normal mood and affect. His behavior is normal. Judgment and thought content normal.    ED Course  Procedures (including critical care time)  Labs Reviewed  COMPREHENSIVE METABOLIC PANEL - Abnormal; Notable for the following:    GFR calc non Af Amer 87 (*)     All other components within normal limits  PHENYTOIN LEVEL, TOTAL - Abnormal; Notable for the following:  Phenytoin Lvl 4.8 (*)     All other components within normal limits  CBC WITH DIFFERENTIAL  ETHANOL  URINALYSIS, ROUTINE W REFLEX MICROSCOPIC  URINE RAPID DRUG SCREEN (HOSP PERFORMED)  LAB REPORT - SCANNED   Ct Head Wo Contrast  07/03/2012  *RADIOLOGY REPORT*  Clinical Data: Headache.  Seizures.  Ventriculoperitoneal shunt.  CT HEAD WITHOUT CONTRAST  Technique:  Contiguous axial images were obtained from the base of the skull through the vertex without contrast.  Comparison: 10/16/2009  Findings: There is no evidence of intracranial hemorrhage, brain edema or other signs of acute infarction.  There is no evidence of  intracranial mass lesion or mass effect.  No abnormal extra-axial fluid collections are identified.  Ventricles are stable in size.  Right posterior parietal ventricular shunt catheter remains in place, with tip near the foramen of Monro.  Bilateral frontal encephalomalacia remains stable in appearance with dystrophic calcifications seen in the right frontal region, and ex vacuo dilatation of both frontal horns, right side greater than left.  No evidence of acute skull fracture or other significant bone abnormality.  Chronic frontal and ethmoid sinus disease again noted.  IMPRESSION:  1.  No acute intracranial findings. 2.  Stable bilateral frontal encephalomalacia.   Original Report Authenticated By: Myles Rosenthal, M.D.      1. Head injury, closed, without LOC   2. Forehead abrasion   3. Seizure disorder   4. Subtherapeutic serum phenytoin level       MDM  61 yo male with h/o seizure disorder, recent etoh binge, fall with forehead abrasion, off his dilantin.  Level is subtherapeutic, will give oral load.  CT scan of head without intracranial injury.  Tetanus updated.  Rx for dilantin given along with outpatient resources.        Olivia Mackie, MD 07/03/12 2024

## 2012-10-13 ENCOUNTER — Encounter (HOSPITAL_COMMUNITY): Payer: Self-pay | Admitting: Emergency Medicine

## 2012-10-13 ENCOUNTER — Emergency Department (HOSPITAL_COMMUNITY)
Admission: EM | Admit: 2012-10-13 | Discharge: 2012-10-13 | Disposition: A | Discharge status: Home / Self Care | Payer: Medicare Other | Attending: Emergency Medicine | Admitting: Emergency Medicine

## 2012-10-13 ENCOUNTER — Emergency Department (HOSPITAL_COMMUNITY): Payer: Medicare Other

## 2012-10-13 DIAGNOSIS — R748 Abnormal levels of other serum enzymes: Secondary | ICD-10-CM | POA: Insufficient documentation

## 2012-10-13 DIAGNOSIS — F29 Unspecified psychosis not due to a substance or known physiological condition: Secondary | ICD-10-CM | POA: Insufficient documentation

## 2012-10-13 DIAGNOSIS — Z7982 Long term (current) use of aspirin: Secondary | ICD-10-CM | POA: Insufficient documentation

## 2012-10-13 DIAGNOSIS — F17200 Nicotine dependence, unspecified, uncomplicated: Secondary | ICD-10-CM | POA: Insufficient documentation

## 2012-10-13 DIAGNOSIS — Z87828 Personal history of other (healed) physical injury and trauma: Secondary | ICD-10-CM | POA: Insufficient documentation

## 2012-10-13 DIAGNOSIS — R7889 Finding of other specified substances, not normally found in blood: Secondary | ICD-10-CM

## 2012-10-13 DIAGNOSIS — I10 Essential (primary) hypertension: Secondary | ICD-10-CM | POA: Insufficient documentation

## 2012-10-13 DIAGNOSIS — R5383 Other fatigue: Secondary | ICD-10-CM | POA: Insufficient documentation

## 2012-10-13 DIAGNOSIS — Z7902 Long term (current) use of antithrombotics/antiplatelets: Secondary | ICD-10-CM | POA: Insufficient documentation

## 2012-10-13 DIAGNOSIS — Z8673 Personal history of transient ischemic attack (TIA), and cerebral infarction without residual deficits: Secondary | ICD-10-CM | POA: Insufficient documentation

## 2012-10-13 DIAGNOSIS — R5381 Other malaise: Secondary | ICD-10-CM | POA: Insufficient documentation

## 2012-10-13 DIAGNOSIS — Z982 Presence of cerebrospinal fluid drainage device: Secondary | ICD-10-CM | POA: Insufficient documentation

## 2012-10-13 DIAGNOSIS — G40909 Epilepsy, unspecified, not intractable, without status epilepticus: Secondary | ICD-10-CM | POA: Insufficient documentation

## 2012-10-13 DIAGNOSIS — Z79899 Other long term (current) drug therapy: Secondary | ICD-10-CM | POA: Insufficient documentation

## 2012-10-13 HISTORY — DX: Cerebral infarction, unspecified: I63.9

## 2012-10-13 LAB — POCT I-STAT, CHEM 8
BUN: 10 mg/dL (ref 6–23)
Calcium, Ion: 1.22 mmol/L (ref 1.13–1.30)
Creatinine, Ser: 0.9 mg/dL (ref 0.50–1.35)
TCO2: 27 mmol/L (ref 0–100)

## 2012-10-13 LAB — PHENYTOIN LEVEL, TOTAL: Phenytoin Lvl: <2.5 ug/mL — ABNORMAL LOW (ref 10.0–20.0)

## 2012-10-13 MED ORDER — LISINOPRIL 20 MG PO TABS: 10.0000 mg | ORAL_TABLET | Freq: Every day | ORAL | Status: DC

## 2012-10-13 MED ORDER — PHENYTOIN SODIUM EXTENDED 100 MG PO CAPS: 300.0000 mg | ORAL_CAPSULE | Freq: Every day | ORAL | Status: DC

## 2012-10-13 MED ORDER — CLOPIDOGREL BISULFATE 75 MG PO TABS: 75.0000 mg | ORAL_TABLET | Freq: Every day | ORAL | Status: DC

## 2012-10-13 MED ORDER — SODIUM CHLORIDE 0.9 % IV SOLN
700.0000 mg | Freq: Once | INTRAVENOUS | Status: AC
  Administered 2012-10-13: 700 mg via INTRAVENOUS
  Filled 2012-10-13: qty 14

## 2012-10-13 MED ORDER — LORAZEPAM 1 MG PO TABS
2.0000 mg | ORAL_TABLET | Freq: Once | ORAL | Status: AC
  Administered 2012-10-13: 2 mg via ORAL
  Filled 2012-10-13: qty 2

## 2012-10-13 NOTE — ED Notes (Signed)
Per EMS: pts girlfriend states she thinks he had seizure this morning and possibly having stroke; pt c/o being sluggish; pt negative for stroke symptoms per EMS; pt states normally sluggish after seizures; pt has been without medications for 2 weeks due to not being able to afford them; BP 130/78 pulse 60 CBG 80

## 2012-10-13 NOTE — ED Notes (Addendum)
Pt alert and mentating appropriately upon d/c teaching; pt has been instructed multiple times to call PJ the case worker; educated pts girlfriend on phone to call PJ the case worker in the AM about getting pts medications; pt verbalizes understanding of d/c teaching and prescriptions; pt has no other questions upon d/c; NAD noted upon d/c; pt being taken out via wheelchair and with bus ticket via [place], [place]

## 2012-10-13 NOTE — ED Provider Notes (Addendum)
History     CSN: [id number]  Arrival date & time 10/13/12  1723   First MD Initiated Contact with Patient 10/13/12 1751      Chief Complaint  Patient presents with  . Seizures    (Consider location/radiation/quality/duration/timing/severity/associated sxs/prior treatment) Patient is a 62 y.o. male presenting with seizures. The history is provided by the patient.  Seizures Seizure activity on arrival: no   Seizure type:  Unable to specify Preceding symptoms comment:  Patient did not remember anything before the seizure today Initial focality:  Unable to specify Episode characteristics: generalized shaking   Postictal symptoms: confusion   Postictal symptoms comment:  Fatigue Return to baseline: yes   Severity:  Moderate Timing:  Once Number of seizures this episode:  1 Progression:  Unchanged Context: change in medication, intracranial shunt and previous head injury   Context: not alcohol withdrawal   Context comment:  Has been off of his medications for greater than 2 weeks due to inability to afford them Recent head injury:  No recent head injuries PTA treatment:  None History of seizures: yes   Seizure control level:  Poorly controlled Current therapy:  Phenytoin Compliance with current therapy:  Poor   Past Medical History  Diagnosis Date  . Hx of ventricular shunt   . Seizures   . Hypertension   . Stroke     History reviewed. No pertinent past surgical history.  History reviewed. No pertinent family history.  History  Substance Use Topics  . Smoking status: Current Every Day Smoker  . Smokeless tobacco: Not on file  . Alcohol Use: Yes      Review of Systems  Constitutional: Positive for fatigue.  Neurological: Positive for seizures.  All other systems reviewed and are negative.    Allergies  Review of patient's allergies indicates no known allergies.  Home Medications   Current Outpatient Rx  Name  Route  Sig  Dispense  Refill  .  albuterol (PROVENTIL HFA;VENTOLIN HFA) 108 (90 BASE) MCG/ACT inhaler   Inhalation   Inhale 2 puffs into the lungs every 6 (six) hours as needed for wheezing.         Marland Kitchen aspirin EC 81 MG tablet   Oral   Take 81 mg by mouth daily.         . clopidogrel (PLAVIX) 75 MG tablet   Oral   Take 75 mg by mouth daily.         . diphenhydrAMINE (BENADRYL) 25 mg capsule   Oral   Take 25 mg by mouth every 6 (six) hours as needed for allergies.         Marland Kitchen donepezil (ARICEPT) 10 MG tablet   Oral   Take 10 mg by mouth at bedtime.         Marland Kitchen ibuprofen (ADVIL,MOTRIN) 800 MG tablet   Oral   Take 800 mg by mouth every 8 (eight) hours as needed for pain.         Marland Kitchen lisinopril (PRINIVIL,ZESTRIL) 10 MG tablet   Oral   Take 10 mg by mouth daily.         . memantine (NAMENDA) 10 MG tablet   Oral   Take 10 mg by mouth 2 (two) times daily.         . phenytoin (DILANTIN) 100 MG ER capsule   Oral   Take 300 mg by mouth at bedtime.           BP 137/79  Pulse 57  Temp(Src) 98.4 F (36.9 C) (Oral)  SpO2 100%  Physical Exam  Nursing note and vitals reviewed. Constitutional: He is oriented to person, place, and time. He appears well-developed and well-nourished. No distress.  HENT:  Head: Normocephalic and atraumatic.  Mouth/Throat: Oropharynx is clear and moist.  VP shunt tubing palpated along the right side of the neck  Eyes: Conjunctivae and EOM are normal. Pupils are equal, round, and reactive to light.  Neck: Normal range of motion. Neck supple.  Cardiovascular: Normal rate, regular rhythm and intact distal pulses.   No murmur heard. Pulmonary/Chest: Effort normal and breath sounds normal. No respiratory distress. He has no wheezes. He has no rales.  Abdominal: Soft. He exhibits no distension. There is no tenderness. There is no rebound and no guarding.  Musculoskeletal: Normal range of motion. He exhibits no edema and no tenderness.  Neurological: He is alert and oriented  to person, place, and time. He has normal strength. No cranial nerve deficit or sensory deficit.  Skin: Skin is warm and dry. No rash noted. No erythema.  Psychiatric: He has a normal mood and affect. His behavior is normal.    ED Course  Procedures (including critical care time)  Labs Reviewed  PHENYTOIN LEVEL, TOTAL - Abnormal; Notable for the following:    Phenytoin Lvl <2.5 (*)    All other components within normal limits  POCT I-STAT, CHEM 8 - Abnormal; Notable for the following:    Sodium 146 (*)    All other components within normal limits   Ct Head Wo Contrast  10/13/2012  *RADIOLOGY REPORT*  Clinical Data: Possible seizure.  CT HEAD WITHOUT CONTRAST  Technique:  Contiguous axial images were obtained from the base of the skull through the vertex without contrast.  Comparison: CT 07/03/2012  Findings: Encephalomalacia in the inferior frontal lobes bilaterally, right greater than left.  There is dystrophic calcification in the right frontal lobe, unchanged from the prior study.  This is most likely due to chronic head injury.  There is enlargement of the right frontal horn due to volume loss in the right frontal lobe. 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  Right parietal shunt catheter is unchanged.  No hydrocephalus. Negative for acute hemorrhage.  Negative for acute infarct or mass.  Chronic sinusitis.  IMPRESSION: Chronic frontal lobe injury bilaterally is stable.  No acute abnormality.   Original Report Authenticated By: Janeece Riggers, M.D.      1. Seizure disorder   2. Subtherapeutic serum phenytoin level       MDM   Patient with a history of seizure disorder who had a seizure today. Patient denies any pain or injury from the seizure but family members states that he was very sleepy this morning and they were concerned he was having a stroke. 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 He has not been on Dilantin for greater than 2 weeks  due to inability to afford it.  Patient has no focal neurologic findings at this time and only complains of being tired. She was given Ativan to prevent further seizures. Will most likely need to load him with Depakote. I-STAT and phenytoin level pending. Head CT pending  6:54 PM Labs and CT neg.  Will load with fosphenytoin and d/c home.  Will have case management speak with pt about affording meds.      Gwyneth Sprout, MD 10/13/12 0347  Gwyneth Sprout, MD 10/13/12 4259

## 2012-10-13 NOTE — ED Notes (Signed)
2 failed IV attempts on left forearm and right AC; asked second nurse to look

## 2012-10-13 NOTE — ED Notes (Signed)
Lowell Guitar, RN at bedside attempting IV

## 2012-10-13 NOTE — ED Notes (Signed)
Pt denies pain; pt states "I just feel all funny like." Pt alert and mentating appropriately; pt denies numbness and tingling; pt denies n/v/d; pt denies headache and blurred vision; pt states he is dizzy and lightheaded; NAD noted at this time; pt denies injury to head during seizure today.

## 2012-10-13 NOTE — ED Notes (Signed)
Case manager called at this time. Case manager requesting pt call her directly at [phone] to attempt to get a 30 day supply of seizure medication while alternate health care coverage is obtained. Pt's direct care RN made aware

## 2012-10-13 NOTE — ED Provider Notes (Signed)
Miguel [name]  8:00 PM  Pt discussed in sign out with Dr. Anitra Lauth. Pt with hx of seizure currently off dilantin due to lack of money.  Pt with one episode of seizure now back to baseline.  Pt receiving loading dose of fosphenytoin.  Will also try to assist with home Rx if possible.  Pt does follow up with [facility].   Case manager has review the patient'[name] information. She would like patient to call first thing tomorrow and they will assist with helping with prescriptions at that time her phone number is [phone].  Patient has received medications and is well at this time. Will discharge with prescriptions for his home medicines.   Angus Seller, PA-C 10/13/12 2109

## 2012-10-13 NOTE — ED Notes (Signed)
Dr. Plunkett at bedside.  

## 2012-10-15 NOTE — ED Provider Notes (Signed)
Medical screening examination/treatment/procedure(s) were performed by non-physician practitioner and as supervising physician I was immediately available for consultation/collaboration.  Raeford Razor, MD 10/15/12 1355

## 2013-01-22 ENCOUNTER — Emergency Department (HOSPITAL_COMMUNITY): Payer: Medicare Other

## 2013-01-22 ENCOUNTER — Emergency Department (HOSPITAL_COMMUNITY)
Admission: EM | Admit: 2013-01-22 | Discharge: 2013-01-22 | Disposition: A | Discharge status: Home / Self Care | Payer: Medicare Other | Attending: Emergency Medicine | Admitting: Emergency Medicine

## 2013-01-22 ENCOUNTER — Encounter (HOSPITAL_COMMUNITY): Payer: Self-pay | Admitting: Physical Medicine and Rehabilitation

## 2013-01-22 DIAGNOSIS — F10121 Alcohol abuse with intoxication delirium: Secondary | ICD-10-CM

## 2013-01-22 DIAGNOSIS — R4789 Other speech disturbances: Secondary | ICD-10-CM | POA: Insufficient documentation

## 2013-01-22 DIAGNOSIS — Z79899 Other long term (current) drug therapy: Secondary | ICD-10-CM | POA: Insufficient documentation

## 2013-01-22 DIAGNOSIS — Z7982 Long term (current) use of aspirin: Secondary | ICD-10-CM | POA: Insufficient documentation

## 2013-01-22 DIAGNOSIS — F10231 Alcohol dependence with withdrawal delirium: Secondary | ICD-10-CM

## 2013-01-22 DIAGNOSIS — F10931 Alcohol use, unspecified with withdrawal delirium: Secondary | ICD-10-CM | POA: Insufficient documentation

## 2013-01-22 DIAGNOSIS — I10 Essential (primary) hypertension: Secondary | ICD-10-CM | POA: Insufficient documentation

## 2013-01-22 DIAGNOSIS — G40909 Epilepsy, unspecified, not intractable, without status epilepticus: Secondary | ICD-10-CM | POA: Insufficient documentation

## 2013-01-22 DIAGNOSIS — F17200 Nicotine dependence, unspecified, uncomplicated: Secondary | ICD-10-CM | POA: Insufficient documentation

## 2013-01-22 DIAGNOSIS — Z982 Presence of cerebrospinal fluid drainage device: Secondary | ICD-10-CM | POA: Insufficient documentation

## 2013-01-22 DIAGNOSIS — Z8673 Personal history of transient ischemic attack (TIA), and cerebral infarction without residual deficits: Secondary | ICD-10-CM | POA: Insufficient documentation

## 2013-01-22 DIAGNOSIS — Z7902 Long term (current) use of antithrombotics/antiplatelets: Secondary | ICD-10-CM | POA: Insufficient documentation

## 2013-01-22 LAB — CBC WITH DIFFERENTIAL/PLATELET
Basophils Relative: 1 % (ref 0–1)
Eosinophils Absolute: 0.4 10*3/uL (ref 0.0–0.7)
Hemoglobin: 16 g/dL (ref 13.0–17.0)
MCH: 31.1 pg (ref 26.0–34.0)
MCHC: 34.5 g/dL (ref 30.0–36.0)
Monocytes Relative: 9 % (ref 3–12)
Neutrophils Relative %: 38 % — ABNORMAL LOW (ref 43–77)
Platelets: 193 10*3/uL (ref 150–400)
RDW: 14.6 % (ref 11.5–15.5)

## 2013-01-22 LAB — RAPID URINE DRUG SCREEN, HOSP PERFORMED
Opiates: NOT DETECTED
Tetrahydrocannabinol: NOT DETECTED

## 2013-01-22 LAB — GLUCOSE, CAPILLARY: Glucose-Capillary: 88 mg/dL (ref 70–99)

## 2013-01-22 LAB — BASIC METABOLIC PANEL
BUN: 15 mg/dL (ref 6–23)
Calcium: 8.7 mg/dL (ref 8.4–10.5)
Creatinine, Ser: 0.9 mg/dL (ref 0.50–1.35)
GFR calc non Af Amer: >90 mL/min (ref >90)
Glucose, Bld: 88 mg/dL (ref 70–99)

## 2013-01-22 LAB — PHENYTOIN LEVEL, TOTAL: Phenytoin Lvl: <2.5 ug/mL — ABNORMAL LOW (ref 10.0–20.0)

## 2013-01-22 MED ORDER — SODIUM CHLORIDE 0.9 % IV BOLUS (SEPSIS)
1000.0000 mL | Freq: Once | INTRAVENOUS | Status: AC
  Administered 2013-01-22: 1000 mL via INTRAVENOUS

## 2013-01-22 MED ORDER — SODIUM CHLORIDE 0.9 % IV SOLN: Freq: Once | INTRAVENOUS | Status: DC

## 2013-01-22 NOTE — ED Provider Notes (Addendum)
History    CSN: [id number] Arrival date & time 01/22/13  0945  First MD Initiated Contact with Patient 01/22/13 [phone]     Chief Complaint  Patient presents with  . Alcohol Intoxication  . Fall    Patient is a 62 y.o. male presenting with intoxication. The history is provided by the EMS personnel. The history is limited by the condition of the patient.  Alcohol Intoxication This is a new problem. Episode onset: unknown time ago. The problem occurs constantly. The problem has been gradually worsening. Nothing aggravates the symptoms. Nothing relieves the symptoms.  pt presents via EMS for presumed ETOH intoxication as well as fall Apparently he was drinking ETOH and was noted to fall off of low lying wall Pt has been slurring speech and incoherent No other details are known Past Medical History  Diagnosis Date  . Hx of ventricular shunt   . Seizures   . Hypertension   . Stroke    No past surgical history on file. History reviewed. No pertinent family history. History  Substance Use Topics  . Smoking status: Current Every Day Smoker  . Smokeless tobacco: Not on file  . Alcohol Use: Yes    Review of Systems  Unable to perform ROS: Mental status change  Constitutional: Fatigue: .now.  HENT: Neck stiffness: .now.    Error in typing ROS, unable to remove from constitutional and HENT, unable to perform due to mental status Allergies  Review of patient's allergies indicates no known allergies.  Home Medications   Current Outpatient Rx  Name  Route  Sig  Dispense  Refill  . albuterol (PROVENTIL HFA;VENTOLIN HFA) 108 (90 BASE) MCG/ACT inhaler   Inhalation   Inhale 2 puffs into the lungs every 6 (six) hours as needed for wheezing.         Marland Kitchen aspirin EC 81 MG tablet   Oral   Take 81 mg by mouth daily.         . clopidogrel (PLAVIX) 75 MG tablet   Oral   Take 75 mg by mouth daily.         . clopidogrel (PLAVIX) 75 MG tablet   Oral   Take 1 tablet (75 mg total) by  mouth daily.   30 tablet   0   . diphenhydrAMINE (BENADRYL) 25 mg capsule   Oral   Take 25 mg by mouth every 6 (six) hours as needed for allergies.         Marland Kitchen donepezil (ARICEPT) 10 MG tablet   Oral   Take 10 mg by mouth at bedtime.         Marland Kitchen ibuprofen (ADVIL,MOTRIN) 800 MG tablet   Oral   Take 800 mg by mouth every 8 (eight) hours as needed for pain.         Marland Kitchen lisinopril (PRINIVIL,ZESTRIL) 10 MG tablet   Oral   Take 10 mg by mouth daily.         Marland Kitchen lisinopril (PRINIVIL,ZESTRIL) 20 MG tablet   Oral   Take 0.5 tablets (10 mg total) by mouth daily.   30 tablet   0   . memantine (NAMENDA) 10 MG tablet   Oral   Take 10 mg by mouth 2 (two) times daily.         . phenytoin (DILANTIN) 100 MG ER capsule   Oral   Take 300 mg by mouth at bedtime.         . phenytoin (DILANTIN) 100 MG ER  capsule   Oral   Take 3 capsules (300 mg total) by mouth at bedtime.   30 capsule   0   BP 103/83  Pulse 73  Temp(Src) 94.9 F (34.9 C) (Rectal)  Resp 72  SpO2 95%  Physical Exam CONSTITUTIONAL: disheveled, smells of ETOH HEAD: Normocephalic/atraumatic EYES: EOMI/PERRL ENMT: Mucous membranes moist.  Edentulous.  No evidence of facial/nasal trauma NECK: cervical collar in place SPINE:No bruising/crepitance/stepoffs noted to spine Patient maintained in spinal precautions/logroll utilized CV: S1/S2 noted, no murmurs/rubs/gallops noted LUNGS: Lungs are clear to auscultation bilaterally, no apparent distress ABDOMEN: soft, nontender, no rebound or guarding GU:no cva tenderness, no signs of trauma, chaperone present NEURO: Pt is awake/alert, he will follow commands and will move all extremities but is unable to communicate and has garbled speech EXTREMITIES: pulses normal, full ROM, no obvious deformity noted to any extremity SKIN: warm, color normal PSYCH: anxious  ED Course  Procedures  Labs Reviewed  GLUCOSE, CAPILLARY  BASIC METABOLIC PANEL  CBC WITH DIFFERENTIAL   ETHANOL  URINE RAPID DRUG SCREEN (HOSP PERFORMED)  CK  PHENYTOIN LEVEL, TOTAL  pt appears intoxicated with fall.  No signs of trauma but history is unclear and his speech is garbled and will not allow for full evaluation.  He does have h/o seizure as well as VP shunt.  Will obtain CT imaging and reassess Pt is not tachypneic on my evaluation.  He is in no resp. Distress on my evaluation  11:52 AM Ct imaging negative Pt more awake but speech is garbled, possibly due to ETOH intoxication but he is also edentulous on maxillary teeth Will allow patient to sober and reassess c-collar removed Hypothermia noted, will place warming blanket 2:56 PM Pt more alert, however persistently hypothermic.  He is answering most questions appropriately but difficult to understand at this time Due to hypothermia, I have ordered bair hugger as well as triad to evaluate patient for admission  MDM  Nursing notes including past medical history and social history reviewed and considered in documentation Previous records reviewed and considered - recent ED visit reviewed xrays reviewed and considered Labs/vital reviewed and considered      Date: 01/22/2013  Rate: 147 (artifact noted  Rhythm: indeterminate  QRS Axis: normal  Intervals: normal  ST/T Wave abnormalities: nonspecific ST changes  Conduction Disutrbances:none  Narrative Interpretation:   Old EKG Reviewed: changes noted Significant artifact limits EKG interpretation        Joya Gaskins, MD 01/22/13 1457  4:35 PM Pt improved, ambulatory, and his temp has improved He is clinically sober and feels well He reports he has all of his meds and does not need refill I advised him to take his dilantin Appreciate medicine consult  Stable for d/c  Joya Gaskins, MD 01/22/13 1636

## 2013-01-22 NOTE — ED Notes (Signed)
bair hugger applied. Pt resting quietly at the time. Safety sitter at bedside. Vital signs stable. No change in neurological status, unable to follow commands, speech remains slurred.

## 2013-01-22 NOTE — ED Notes (Signed)
CBG 88 

## 2013-01-22 NOTE — ED Notes (Signed)
Seizure pads applied to bed, suction at bedside. C-collar remains in place. Vital signs stable.
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
## 2013-01-22 NOTE — ED Notes (Signed)
Pt presents to department via [facility] for evaluation of ETOH. Pt was downtown sitting on brick wall drinking beer when bystanders saw him fall over. Slurred speech and ETOH smell, unable to follow simple commands. CBG 87. LSB and c-collar upon arrival.

## 2013-02-24 ENCOUNTER — Emergency Department (HOSPITAL_COMMUNITY): Payer: Medicare Other

## 2013-02-24 ENCOUNTER — Encounter (HOSPITAL_COMMUNITY): Payer: Self-pay

## 2013-02-24 ENCOUNTER — Inpatient Hospital Stay (HOSPITAL_COMMUNITY)
Admission: EM | Admit: 2013-02-24 | Discharge: 2013-03-01 | DRG: 918 | Disposition: A | Discharge status: Home Health | Payer: Medicare Other | Attending: Internal Medicine | Admitting: Internal Medicine

## 2013-02-24 DIAGNOSIS — Z720 Tobacco use: Secondary | ICD-10-CM

## 2013-02-24 DIAGNOSIS — I10 Essential (primary) hypertension: Secondary | ICD-10-CM

## 2013-02-24 DIAGNOSIS — Z7902 Long term (current) use of antithrombotics/antiplatelets: Secondary | ICD-10-CM

## 2013-02-24 DIAGNOSIS — Z8673 Personal history of transient ischemic attack (TIA), and cerebral infarction without residual deficits: Secondary | ICD-10-CM

## 2013-02-24 DIAGNOSIS — R569 Unspecified convulsions: Secondary | ICD-10-CM

## 2013-02-24 DIAGNOSIS — Z79899 Other long term (current) drug therapy: Secondary | ICD-10-CM

## 2013-02-24 DIAGNOSIS — Z5900 Homelessness unspecified: Secondary | ICD-10-CM

## 2013-02-24 DIAGNOSIS — Z982 Presence of cerebrospinal fluid drainage device: Secondary | ICD-10-CM

## 2013-02-24 DIAGNOSIS — Z681 Body mass index (BMI) 19 or less, adult: Secondary | ICD-10-CM

## 2013-02-24 DIAGNOSIS — Z87891 Personal history of nicotine dependence: Secondary | ICD-10-CM | POA: Diagnosis present

## 2013-02-24 DIAGNOSIS — T426X1A Poisoning by other antiepileptic and sedative-hypnotic drugs, accidental (unintentional), initial encounter: Secondary | ICD-10-CM | POA: Diagnosis present

## 2013-02-24 DIAGNOSIS — Z91199 Patient's noncompliance with other medical treatment and regimen due to unspecified reason: Secondary | ICD-10-CM

## 2013-02-24 DIAGNOSIS — F1010 Alcohol abuse, uncomplicated: Secondary | ICD-10-CM | POA: Diagnosis present

## 2013-02-24 DIAGNOSIS — I959 Hypotension, unspecified: Secondary | ICD-10-CM | POA: Diagnosis present

## 2013-02-24 DIAGNOSIS — F17200 Nicotine dependence, unspecified, uncomplicated: Secondary | ICD-10-CM | POA: Diagnosis present

## 2013-02-24 DIAGNOSIS — Z87820 Personal history of traumatic brain injury: Secondary | ICD-10-CM

## 2013-02-24 DIAGNOSIS — R636 Underweight: Secondary | ICD-10-CM | POA: Diagnosis present

## 2013-02-24 DIAGNOSIS — Z9119 Patient's noncompliance with other medical treatment and regimen: Secondary | ICD-10-CM

## 2013-02-24 DIAGNOSIS — F10120 Alcohol abuse with intoxication, uncomplicated: Secondary | ICD-10-CM

## 2013-02-24 DIAGNOSIS — T420X1A Poisoning by hydantoin derivatives, accidental (unintentional), initial encounter: Principal | ICD-10-CM | POA: Diagnosis present

## 2013-02-24 DIAGNOSIS — F0390 Unspecified dementia without behavioral disturbance: Secondary | ICD-10-CM | POA: Diagnosis present

## 2013-02-24 LAB — COMPREHENSIVE METABOLIC PANEL
ALT: 17 U/L (ref 0–53)
AST: 23 U/L (ref 0–37)
Albumin: 3.6 g/dL (ref 3.5–5.2)
CO2: 24 mEq/L (ref 19–32)
Calcium: 8.8 mg/dL (ref 8.4–10.5)
Creatinine, Ser: 1.04 mg/dL (ref 0.50–1.35)
Sodium: 146 mEq/L — ABNORMAL HIGH (ref 135–145)

## 2013-02-24 LAB — CBC
MCH: 31.3 pg (ref 26.0–34.0)
MCV: 90.3 fL (ref 78.0–100.0)
Platelets: 160 10*3/uL (ref 150–400)
RBC: 4.34 MIL/uL (ref 4.22–5.81)
RDW: 14.5 % (ref 11.5–15.5)
WBC: 7 10*3/uL (ref 4.0–10.5)

## 2013-02-24 LAB — RAPID URINE DRUG SCREEN, HOSP PERFORMED
Amphetamines: NOT DETECTED
Benzodiazepines: NOT DETECTED
Cocaine: NOT DETECTED
Opiates: NOT DETECTED

## 2013-02-24 LAB — GLUCOSE, CAPILLARY: Glucose-Capillary: 107 mg/dL — ABNORMAL HIGH (ref 70–99)

## 2013-02-24 LAB — ACETAMINOPHEN LEVEL: Acetaminophen (Tylenol), Serum: <15 ug/mL (ref 10–30)

## 2013-02-24 LAB — SALICYLATE LEVEL: Salicylate Lvl: <2 mg/dL — ABNORMAL LOW (ref 2.8–20.0)

## 2013-02-24 MED ORDER — LORAZEPAM 2 MG/ML IJ SOLN: 1.0000 mg | Freq: Once | INTRAMUSCULAR | Status: AC

## 2013-02-24 MED ORDER — HALOPERIDOL LACTATE 5 MG/ML IJ SOLN
INTRAMUSCULAR | Status: AC
  Administered 2013-02-24: 5 mg
  Filled 2013-02-24: qty 1

## 2013-02-24 MED ORDER — LORAZEPAM 2 MG/ML IJ SOLN
INTRAMUSCULAR | Status: AC
  Administered 2013-02-24: 1 mg
  Filled 2013-02-24: qty 1

## 2013-02-24 MED ORDER — SODIUM CHLORIDE 0.9 % IV BOLUS (SEPSIS)
1000.0000 mL | Freq: Once | INTRAVENOUS | Status: AC
  Administered 2013-02-24: 1000 mL via INTRAVENOUS

## 2013-02-24 NOTE — ED Notes (Addendum)
Patient transported to radiology; accompanied by EMT

## 2013-02-24 NOTE — ED Notes (Signed)
Patient agitated, mumbling, loud and attempting to get OOB. Security and [employer] at bedside. Patient assisted back into bed. MD notified of situation and patient's hx. Patient on cardiac monitoring. VSS. Will continue to monitor.
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
## 2013-02-24 NOTE — ED Notes (Signed)
Patient returned from xray. EMT remains at bedside

## 2013-02-24 NOTE — ED Notes (Addendum)
Patient has been laying in bed quietly. Contacted radiology to see if they could come and get patient for imaging

## 2013-02-24 NOTE — ED Notes (Signed)
Patient removed cardiac monitoring and attempting to get out of bed. Swinging arms at staff and spitting. ED RNs, ED EMTS, [employer], security and EDP at bedside. Patient assisted back in bed. Verbal order for Haldol 5 mg IVP given and administered. Patient resting comfotably at this time. Side rails up x 2. Call bell in reach. Will continue to monitor on patient.

## 2013-02-24 NOTE — ED Notes (Signed)
Notified by staff who was walking by that patient was "getting out of bed." immediately went to room and found patient up OOB. Patient fell and hit right upper back and right flank area on bed rail and landed to the floor on his right side. Unsure if patient hit head. No LOC. No obvious wounds to head however, large abrasion noted to right upper back that extends downward to right flank. No bleeding noted. Patient c/o pain to the area of impact. Nursing staff and EDP at bedside. Patient assisted back into bed. Very drowsy (from previous medication administration). Red socks and yellow arm band applied. Safety huddle done at bedside with patient, nursing staff and charge RN. Patient unable to contract for safety. Side rails up x 2. Call light within reach. Sitter placed at bedside for safety.

## 2013-02-24 NOTE — ED Notes (Signed)
Patient's "significant other" phoned earlier inquiring about patient's status. No information was provided however nurse secretary obtained name and number for nursing to contact. Attempted to phone significant other Virgina Evener @ [phone]). No answer. She subsequently phoned back. Cofirmed that patient was in emergency room but unable to provide any information regarding his care. Ms. Clelia Croft verbalized understating of protected patient information and will come to the ED to see the patient.

## 2013-02-24 NOTE — ED Notes (Signed)
All x-rays and CT scan normal. Patient sleeping comfortably in bed. Per Dr. Loretha Stapler, patient may be transferred to POD C to "sober up" and will be re-assessed by EDP for possible discharge home later tonight. Report given and care transferred to [facility], RN & Felipa Eth, RN. Patient in room 23, directly in front of nursing station. Door opened. Bed rails up x 4. Sleeping comfortably, resp e/u, NAD noted at this time.

## 2013-02-24 NOTE — ED Notes (Signed)
Patient pulled off cardiac leads and aggressively attempting to get OOB. States "I wanna go" Encouraged patient to get back in bed. Security at bedside. Patient assisted back in bed. Cardiac monitoring reapplied. Ativan 1 mg IVP given per Dr. Loretha Stapler. Patient currently laying in bed comfortably. Lights turned down for comfort. VSS. Will continue to monitor.
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
## 2013-02-25 ENCOUNTER — Encounter (HOSPITAL_COMMUNITY): Payer: Self-pay | Admitting: *Deleted

## 2013-02-25 DIAGNOSIS — T420X1A Poisoning by hydantoin derivatives, accidental (unintentional), initial encounter: Principal | ICD-10-CM

## 2013-02-25 DIAGNOSIS — I10 Essential (primary) hypertension: Secondary | ICD-10-CM | POA: Insufficient documentation

## 2013-02-25 DIAGNOSIS — Z9119 Patient's noncompliance with other medical treatment and regimen: Secondary | ICD-10-CM

## 2013-02-25 DIAGNOSIS — Z91199 Patient's noncompliance with other medical treatment and regimen due to unspecified reason: Secondary | ICD-10-CM

## 2013-02-25 DIAGNOSIS — R569 Unspecified convulsions: Secondary | ICD-10-CM | POA: Insufficient documentation

## 2013-02-25 DIAGNOSIS — F1010 Alcohol abuse, uncomplicated: Secondary | ICD-10-CM

## 2013-02-25 DIAGNOSIS — Z87891 Personal history of nicotine dependence: Secondary | ICD-10-CM | POA: Diagnosis present

## 2013-02-25 LAB — CBC
HCT: 45 % (ref 39.0–52.0)
Hemoglobin: 15.7 g/dL (ref 13.0–17.0)
MCHC: 34.9 g/dL (ref 30.0–36.0)
MCV: 90.7 fL (ref 78.0–100.0)
RDW: 14.7 % (ref 11.5–15.5)

## 2013-02-25 LAB — CREATININE, SERUM
GFR calc Af Amer: >90 mL/min (ref >90)
GFR calc non Af Amer: >90 mL/min (ref >90)

## 2013-02-25 LAB — PHENYTOIN LEVEL, TOTAL: Phenytoin Lvl: 26.5 ug/mL — ABNORMAL HIGH (ref 10.0–20.0)

## 2013-02-25 MED ORDER — ADULT MULTIVITAMIN W/MINERALS CH
1.0000 | ORAL_TABLET | Freq: Every day | ORAL | Status: DC
  Administered 2013-02-25 – 2013-03-01 (×5): 1 via ORAL
  Filled 2013-02-25 (×5): qty 1

## 2013-02-25 MED ORDER — THIAMINE HCL 100 MG/ML IJ SOLN
Freq: Once | INTRAVENOUS | Status: AC
  Administered 2013-02-25: 21:00:00 via INTRAVENOUS
  Filled 2013-02-25: qty 1000

## 2013-02-25 MED ORDER — THIAMINE HCL 100 MG/ML IJ SOLN
100.0000 mg | Freq: Every day | INTRAMUSCULAR | Status: DC
  Filled 2013-02-25 (×4): qty 1

## 2013-02-25 MED ORDER — VITAMIN B-1 100 MG PO TABS
100.0000 mg | ORAL_TABLET | Freq: Every day | ORAL | Status: DC
  Administered 2013-02-26 – 2013-03-01 (×4): 100 mg via ORAL
  Filled 2013-02-25 (×4): qty 1

## 2013-02-25 MED ORDER — SODIUM CHLORIDE 0.9 % IJ SOLN
3.0000 mL | Freq: Two times a day (BID) | INTRAMUSCULAR | Status: DC
  Administered 2013-02-25 – 2013-03-01 (×8): 3 mL via INTRAVENOUS

## 2013-02-25 MED ORDER — SODIUM CHLORIDE 0.9 % IV SOLN: INTRAVENOUS | Status: DC

## 2013-02-25 MED ORDER — VITAMIN B-1 100 MG PO TABS: 100.0000 mg | ORAL_TABLET | Freq: Every day | ORAL | Status: DC

## 2013-02-25 MED ORDER — ACETAMINOPHEN 650 MG RE SUPP: 650.0000 mg | Freq: Four times a day (QID) | RECTAL | Status: DC | PRN

## 2013-02-25 MED ORDER — CLOPIDOGREL BISULFATE 75 MG PO TABS
75.0000 mg | ORAL_TABLET | Freq: Every day | ORAL | Status: DC
  Administered 2013-02-25 – 2013-03-01 (×5): 75 mg via ORAL
  Filled 2013-02-25 (×5): qty 1

## 2013-02-25 MED ORDER — FOLIC ACID 1 MG PO TABS: 1.0000 mg | ORAL_TABLET | Freq: Every day | ORAL | Status: DC

## 2013-02-25 MED ORDER — NICOTINE 21 MG/24HR TD PT24
21.0000 mg | MEDICATED_PATCH | Freq: Every day | TRANSDERMAL | Status: DC
  Administered 2013-02-26 – 2013-03-01 (×4): 21 mg via TRANSDERMAL
  Filled 2013-02-25 (×4): qty 1

## 2013-02-25 MED ORDER — PHENYTOIN SODIUM EXTENDED 100 MG PO CAPS
300.0000 mg | ORAL_CAPSULE | Freq: Every day | ORAL | Status: DC
  Filled 2013-02-25: qty 3

## 2013-02-25 MED ORDER — ENOXAPARIN SODIUM 40 MG/0.4ML ~~LOC~~ SOLN
40.0000 mg | SUBCUTANEOUS | Status: DC
  Administered 2013-02-25 – 2013-02-28 (×4): 40 mg via SUBCUTANEOUS
  Filled 2013-02-25 (×5): qty 0.4

## 2013-02-25 MED ORDER — SENNA 8.6 MG PO TABS
1.0000 | ORAL_TABLET | Freq: Two times a day (BID) | ORAL | Status: DC
  Administered 2013-02-25 – 2013-03-01 (×8): 8.6 mg via ORAL
  Filled 2013-02-25 (×9): qty 1

## 2013-02-25 MED ORDER — ONDANSETRON HCL 4 MG PO TABS: 4.0000 mg | ORAL_TABLET | Freq: Four times a day (QID) | ORAL | Status: DC | PRN

## 2013-02-25 MED ORDER — ALUM & MAG HYDROXIDE-SIMETH 200-200-20 MG/5ML PO SUSP: 30.0000 mL | Freq: Four times a day (QID) | ORAL | Status: DC | PRN

## 2013-02-25 MED ORDER — ONDANSETRON HCL 4 MG/2ML IJ SOLN: 4.0000 mg | Freq: Three times a day (TID) | INTRAMUSCULAR | Status: AC | PRN

## 2013-02-25 MED ORDER — LISINOPRIL 10 MG PO TABS
10.0000 mg | ORAL_TABLET | Freq: Every day | ORAL | Status: DC
  Administered 2013-02-25 – 2013-03-01 (×5): 10 mg via ORAL
  Filled 2013-02-25 (×5): qty 1

## 2013-02-25 MED ORDER — FOLIC ACID 1 MG PO TABS
1.0000 mg | ORAL_TABLET | Freq: Every day | ORAL | Status: DC
  Administered 2013-02-26 – 2013-03-01 (×4): 1 mg via ORAL
  Filled 2013-02-25 (×4): qty 1

## 2013-02-25 MED ORDER — ACETAMINOPHEN 325 MG PO TABS: 650.0000 mg | ORAL_TABLET | Freq: Four times a day (QID) | ORAL | Status: DC | PRN

## 2013-02-25 MED ORDER — ONDANSETRON HCL 4 MG/2ML IJ SOLN: 4.0000 mg | Freq: Four times a day (QID) | INTRAMUSCULAR | Status: DC | PRN

## 2013-02-25 MED ORDER — SODIUM CHLORIDE 0.9 % IV SOLN
1250.0000 mg | Freq: Once | INTRAVENOUS | Status: AC
  Administered 2013-02-25: 1250 mg via INTRAVENOUS
  Filled 2013-02-25: qty 25

## 2013-02-25 MED ORDER — DICLOFENAC SODIUM 75 MG PO TBEC
75.0000 mg | DELAYED_RELEASE_TABLET | Freq: Two times a day (BID) | ORAL | Status: DC | PRN
  Filled 2013-02-25: qty 1

## 2013-02-25 MED ORDER — PHENYTOIN SODIUM EXTENDED 100 MG PO CAPS: 300.0000 mg | ORAL_CAPSULE | Freq: Every day | ORAL | Status: DC

## 2013-02-25 MED ORDER — ALBUTEROL SULFATE HFA 108 (90 BASE) MCG/ACT IN AERS
2.0000 | INHALATION_SPRAY | Freq: Four times a day (QID) | RESPIRATORY_TRACT | Status: DC | PRN
  Filled 2013-02-25: qty 6.7

## 2013-02-25 MED ORDER — LORAZEPAM 1 MG PO TABS
1.0000 mg | ORAL_TABLET | Freq: Four times a day (QID) | ORAL | Status: AC | PRN
  Administered 2013-02-26: 1 mg via ORAL
  Filled 2013-02-25: qty 1

## 2013-02-25 MED ORDER — LORAZEPAM 2 MG/ML IJ SOLN: 1.0000 mg | Freq: Four times a day (QID) | INTRAMUSCULAR | Status: AC | PRN

## 2013-02-25 NOTE — ED Notes (Signed)
Report called to ginger on [address]
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
## 2013-02-25 NOTE — Progress Notes (Signed)
RN request for a taxi to get patient home.Patient resides in [place].CSW Informed of request.No case management needs identified at this time.

## 2013-02-25 NOTE — H&P (Signed)
[facility] History and Physical  LEWI DROST ZOX:[id number] DOB: 05-26-1951 DOA: 02/24/2013  Referring physician: Dr. Lajean Saver PCP: Dorrene German, MD  Specialists:   Chief Complaint: Ataxia  HPI: PAIDEN CAVELL is a 62 y.o. male who is homeless and has a history of alcoholism, IVP shunt, Seizures, Hypertension, and CVA.  He is unable to provide much history given his mental status.  Per the ED RN notes was found down on 7/30 on the side of the road.  EMS reports he was hypotensive, had difficulty speaking and an unsteady gait.  He was brought to the ED and found to have an alcohol level of 248 and an undetectable phenytoin level.  He was given 1250 mg of phenytoin and IVF in the ED.  He was kept overnight in order to sober up.  On Thursday (7/31) the EDP attempted to discharge the patient, but discovered he had an ataxic gait.  Repeat phenytoin level was 26 (elevated).   CT head is negative with a stable ventriculostomy, chest xray mentions possible aspiration pneumonitis.   We have been asked to admit Mr. [name] for ataxia.  Of note Mr Heideman had a similar ED visit on 6/27.   Review of Systems: The patient does not give a significant amount of history - but tells me he is hungry for breakfast.  He lives with a friend.  He is out of his medications at home.  He does not have a primary care doctor (although dr. Concepcion Elk is listed in epic).  He denies diarrhea, vomiting, recent illness, CP, AP, SOB.    Past Medical History  Diagnosis Date  . Hx of ventricular shunt   . Seizures   . Hypertension   . Stroke    History reviewed. No pertinent past surgical history. Social History:  reports that he has been smoking.  He does not have any smokeless tobacco history on file. He reports that  drinks alcohol. His drug history is not on file.   No Known Allergies  No family history on file. unable to obtain.  Prior to Admission medications   Medication Sig Start Date End Date Taking?  Authorizing Provider  albuterol (PROVENTIL HFA;VENTOLIN HFA) 108 (90 BASE) MCG/ACT inhaler Inhale 2 puffs into the lungs every 6 (six) hours as needed for wheezing.   Yes Historical Provider, MD  clopidogrel (PLAVIX) 75 MG tablet Take 1 tablet (75 mg total) by mouth daily. 10/13/12  Yes [name] S Dammen, PA-C  diclofenac (VOLTAREN) 75 MG EC tablet Take 75 mg by mouth 2 (two) times daily.   Yes Historical Provider, MD  donepezil (ARICEPT) 10 MG tablet Take 10 mg by mouth at bedtime.   Yes Historical Provider, MD  donepezil (ARICEPT) 10 MG tablet Take 10 mg by mouth at bedtime as needed.   Yes Historical Provider, MD  ibuprofen (ADVIL,MOTRIN) 800 MG tablet Take 800 mg by mouth every 8 (eight) hours as needed for pain.   Yes Historical Provider, MD  lisinopril (PRINIVIL,ZESTRIL) 10 MG tablet Take 10 mg by mouth daily.   Yes Historical Provider, MD  memantine (NAMENDA) 10 MG tablet Take 10 mg by mouth 2 (two) times daily.   Yes Historical Provider, MD  phenytoin (DILANTIN) 100 MG ER capsule Take 3 capsules (300 mg total) by mouth at bedtime. 10/13/12  Yes [name] S Dammen, PA-C  phenytoin (DILANTIN) 100 MG ER capsule Take 3 capsules (300 mg total) by mouth at bedtime. 02/25/13   Dione Booze, MD  Physical Exam: Filed Vitals:   02/24/13 2100 02/24/13 2130 02/25/13 0740 02/25/13 1303  BP: 129/75 117/61 146/88 142/80  Pulse: 58 63 71 63  Temp:   97.8 F (36.6 C)   TempSrc:   Oral   Resp: 11 13 18 16   SpO2: 100% 97% 100% 100%     General:  Very thin, AA male, sitting up in ED, smells strongly of urine  Eyes: muddy sclera, pupils equal and round  Neck: supple  Cardiovascular: rrr, no m/r/g  Respiratory: cta, no w/c/r  Abdomen: thin, nt, nd, +bs, no masses  Skin: no rash, bruise or lesions  Musculoskeletal: able to move all 4 extremities, 5/5 strength  Neurologic: slurred words, no nystagmus, slow to respond, orientated to self, poorly groomed.  Labs on Admission:  Basic Metabolic  Panel:  Recent Labs Lab 02/24/13 2021  NA 146*  K 3.6  CL 113*  CO2 24  GLUCOSE 106*  BUN 13  CREATININE 1.04  CALCIUM 8.8   Liver Function Tests:  Recent Labs Lab 02/24/13 2021  AST 23  ALT 17  ALKPHOS 55  BILITOT 0.5  PROT 7.0  ALBUMIN 3.6   CBC:  Recent Labs Lab 02/24/13 2021  WBC 7.0  HGB 13.6  HCT 39.2  MCV 90.3  PLT 160   CBG:  Recent Labs Lab 02/24/13 1947  GLUCAP 107*    Radiological Exams on Admission: Dg Skull 1-3 Views  02/24/2013   *RADIOLOGY REPORT*  Clinical Data: Altered mental status.  Shunt series.  SKULL - 1-3 VIEW  Comparison: None.  Findings: Right parietal ventriculostomy is present.  There is no discontinuity of the shunt tubing identified.  Partially visualized wire projects over the cervical spine, presumably for fixation.  IMPRESSION: Left parietal ventriculostomy.  No shunt tubing discontinuity identified.   Original Report Authenticated By: Andreas Newport, M.D.   Dg Chest 1 View  02/24/2013   *RADIOLOGY REPORT*  Clinical Data: Altered mental status.  Shunt series.  Hypotension. Alcohol intoxication.  CHEST - 1 VIEW  Comparison: 01/22/2013.  Findings: Shunt tubing projects over the right chest.  This appears contiguous.  Lung volumes are lower than on prior exam.  Basilar opacity is present which could represent aspiration pneumonitis in the setting of intoxication.  This appears more compatible with airspace disease than atelectasis.  Cardiopericardial silhouette appears similar.  IMPRESSION:  1.  Shunt tubing appears contiguous across the chest. 2.  Medial basilar airspace opacity suggestive of aspiration pneumonitis.  There is probably some superimposed atelectasis with lower lung volumes than on prior.   Original Report Authenticated By: Andreas Newport, M.D.   Dg Abd 1 View  02/24/2013   *RADIOLOGY REPORT*  Clinical Data: Hypotension.  Alcohol intoxication.  ABDOMEN - 1 VIEW  Comparison: 10/14/2009.  Findings: The shunt tubing appears  contiguous and terminates in the anatomic pelvis.  There is a safety pin projected over the left anatomic pelvis, presumably external to the patient.  Recommend correlation with inspection.  IVC filter appears similar.  Bowel gas pattern is within normal limits.  No mass lesions or displacement of bowel loops.  IMPRESSION: Shunt tubing appears within normal limits.  Safety pin projects over the left pelvis, presumably external to the patient.   Original Report Authenticated By: Andreas Newport, M.D.   Ct Head Wo Contrast  02/24/2013   *RADIOLOGY REPORT*  Clinical Data: Altered mental status and found unresponsive.  CT HEAD WITHOUT CONTRAST  Technique:  Contiguous axial images were obtained from the base  of the skull through the vertex without contrast.  Comparison: 01/22/2013  Findings: There is stable positioning of a right-sided ventriculostomy catheter.  Ventricular size is stable with no evidence of hydrocephalus.  Stable encephalomalacia is identified involving both frontal lobes, right greater than left with dystrophic calcification in the right frontal lobe. The brain demonstrates no evidence of hemorrhage, acute infarction, edema, mass effect, extra-axial fluid collection or mass lesion.  The skull is unremarkable. Mucosal thickening present in ethmoid air cells and the visualized maxillary sinuses bilaterally.  IMPRESSION: Stable head CT without acute findings.  Ventriculostomy and ventricular size are stable.   Original Report Authenticated By: Irish Lack, M.D.    EKG: Independently reviewed. NSR  Assessment/Plan Active Problems:   Alcohol abuse   Dilantin level too low   Tobacco abuse   Noncompliance  Ataxia Secondary to phenytoin toxicity vs alcoholism vs seizures CT head negative Will monitor overnight.  Request PT / OT consultation  Elevated phenytoin level Admit for observation to telemetry Discussed with pharmacy.  Will let it come down on its own rather than giving activated  charcoal. Consider changing anti seizure meds  Alcohol abuse CIWA protocol Social work consultation  Tobacco abuse Nicotine patch  IVP Shunt Continue plavix  HTN Will resume lisinopril with BP stabilizes  Social This patient will likely continue to return to the ED with similar visits. Consider group home vs ALF? Social work Administrator, sports for resources.   Code Status: full Family Communication: Disposition Plan: Telemetry, observation  Time spent: 1 hour  Conley Canal [facility] Pager [phone]  If 7PM-7AM, please contact night-coverage [email or web address] Password [facility] 02/25/2013, 3:56 PM  Attending Patient seen and examined, agree with the assessment and plan as outlined above. Monitor in tele, repeat Dilantin level in am, PT eval.   S Ghimire

## 2013-02-25 NOTE — ED Provider Notes (Signed)
His nurse reports he is ataxic and unable to walk stably. His CT scan was unremarkable. He was given Dilantin last night. Repeat Dilantin level of 26. 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 Will need admission for observation.  BP 142/80  Pulse 63  Temp(Src) 97.8 F (36.6 C) (Oral)  Resp 16  SpO2 100%   Glynn Octave, MD 02/25/13 [phone]

## 2013-02-25 NOTE — Progress Notes (Signed)
Miguel [name] [id number] Admission Data: 02/25/2013 5:46 PM Attending Provider: Maretta Bees, MD  WUJ:[id number],WGNFA A, MD Consults/ Treatment Team:    Miguel [name] is a 61 y.o. male patient admitted from ED awake, alert  & orientated  X 3,  Full Code, VSS - Blood pressure 149/76, pulse 57, temperature 97.6 F (36.4 C), temperature source Oral, resp. rate 20, height 6\' 1"  (1.854 m), weight 58.8 kg (129 lb 10.1 oz), SpO2 99.00%., no shortness of breath, no c/o chest pain, no distress noted. Tele # 5W TX 13 placed.  IV site WDL:  hand left, condition patent and no redness with a transparent dsg that's clean dry and intact.  Allergies:  No Known Allergies   Past Medical History  Diagnosis Date  . Hx of ventricular shunt   . Seizures   . Hypertension   . Stroke     History:  obtained from the patient. Tobacco/alcohol: Smoked 1 packs per day for 19 years 4 beers per day(s)  Pt orientation to unit, room and routine. Information packet given to patient/family and safety video watched.  Admission INP armband ID verified with patient/family, and in place. SR up x 2, fall risk assessment complete with Patient and family verbalizing understanding of risks associated with falls. Pt verbalizes an understanding of how to use the call bell and to call for help before getting out of bed.  Skin, clean-dry- intact without evidence of bruising, or skin tears.   No evidence of skin break down noted on exam. no rashes, no ecchymoses, no petechiae    Will cont to monitor and assist as needed.  Patrica Mendell Consuella Lose, RN 02/25/2013 5:46 PM

## 2013-02-25 NOTE — ED Provider Notes (Signed)
Patient slept through the night. Repeat alcohol level several hours ago was down to 117. He is now awake and alert and cooperative. He will be discharged with a new prescription for phenytoin and is advised to follow up with his PCP in one week.  Dione Booze, MD 02/25/13 [phone]

## 2013-02-25 NOTE — ED Notes (Signed)
Dr Manus Gunning aware of pt dilantin level. He will come reeval pt and ready him for admission.

## 2013-02-25 NOTE — ED Notes (Signed)
Patient at nurses station cursing calling staff "bitch" and "whore". gpd at desk. Patient instructed to return to his room . He continues to be verbally abusive to staff.

## 2013-02-25 NOTE — ED Notes (Signed)
Pt. Noted to be getting out of bed by himself. Stated he needed urinate. Pt. Noted to smell like urine. Pt. Had been incontinent of urine. Assisted to bathroom by Wandra Mannan. Linen changed and bed cleaned. Pt. Changed into blue scrubs and then helped back into bed.

## 2013-02-26 DIAGNOSIS — R569 Unspecified convulsions: Secondary | ICD-10-CM

## 2013-02-26 DIAGNOSIS — I10 Essential (primary) hypertension: Secondary | ICD-10-CM

## 2013-02-26 LAB — CBC
HCT: 42.8 % (ref 39.0–52.0)
MCH: 31.1 pg (ref 26.0–34.0)
MCHC: 34.6 g/dL (ref 30.0–36.0)
MCV: 89.9 fL (ref 78.0–100.0)
Platelets: 166 10*3/uL (ref 150–400)
RDW: 14.5 % (ref 11.5–15.5)
WBC: 6.7 10*3/uL (ref 4.0–10.5)

## 2013-02-26 LAB — BASIC METABOLIC PANEL
BUN: 11 mg/dL (ref 6–23)
Calcium: 9.1 mg/dL (ref 8.4–10.5)
Creatinine, Ser: 0.84 mg/dL (ref 0.50–1.35)
GFR calc Af Amer: >90 mL/min (ref >90)

## 2013-02-26 LAB — PHENYTOIN LEVEL, TOTAL: Phenytoin Lvl: 18 ug/mL (ref 10.0–20.0)

## 2013-02-26 MED ORDER — DONEPEZIL HCL 10 MG PO TABS
10.0000 mg | ORAL_TABLET | Freq: Every day | ORAL | Status: DC
  Administered 2013-02-26 – 2013-02-28 (×3): 10 mg via ORAL
  Filled 2013-02-26 (×4): qty 1

## 2013-02-26 MED ORDER — ENSURE PUDDING PO PUDG
1.0000 | Freq: Three times a day (TID) | ORAL | Status: DC
  Administered 2013-02-27 – 2013-03-01 (×8): 1 via ORAL

## 2013-02-26 MED ORDER — PHENYTOIN SODIUM EXTENDED 100 MG PO CAPS
300.0000 mg | ORAL_CAPSULE | Freq: Every day | ORAL | Status: DC
  Administered 2013-02-26 – 2013-02-28 (×3): 300 mg via ORAL
  Filled 2013-02-26 (×4): qty 3

## 2013-02-26 MED ORDER — ENSURE COMPLETE PO LIQD
237.0000 mL | Freq: Three times a day (TID) | ORAL | Status: DC
  Administered 2013-02-26 – 2013-03-01 (×10): 237 mL via ORAL

## 2013-02-26 MED ORDER — MEMANTINE HCL 10 MG PO TABS
10.0000 mg | ORAL_TABLET | Freq: Two times a day (BID) | ORAL | Status: DC
  Administered 2013-02-26 – 2013-03-01 (×7): 10 mg via ORAL
  Filled 2013-02-26 (×8): qty 1
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
## 2013-02-26 NOTE — Progress Notes (Signed)
[facility] PROGRESS NOTE  JAFARI MCKILLOP AOZ:[id number] DOB: [birth year] DOA: 02/24/2013 PCP: Dorrene German, MD  Assessment/Plan: Ataxia  -Suspect this is likely more from phenytoin toxicity, although EtOH intoxication could have been contributing  -CT head negative  -PT Recommending SNF vs CIR for rehabilitation -This seems to be better today, although still unsteady, Dilantin level is now just about therapeutic, we will resume  Dilantin toxicity - Likely causing the ataxia on admission -Level normalizing -Pharmacy dosing. -Problem with non-compliance  Alcohol abuse  -Concern for alcohol withdrawL.  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  Dementia -Aricept and Namenda held as they may contribute to decreased mental status on admission-will resume  DVT Prophylaxis: -  lovenox  Code Status: full code Family Communication:  Disposition Plan: to Rehab when medically stable.   Consultants:    Procedures:    Antibiotics:    HPI/Subjective: Patient able to eat.  Denies pain.  Is willing to go to SNF or CIR which ever is more medically appropriate.  Objective: Filed Vitals:   02/25/13 1647 02/25/13 1722 02/25/13 2057 02/26/13 0550  BP: 160/78 149/76 145/88 138/82  Pulse: 74 57 67 60  Temp: 97.5 F (36.4 C) 97.6 F (36.4 C) 98.2 F (36.8 C) 97.7 F (36.5 C)  TempSrc: Oral Oral Oral Oral  Resp: 20 20 20 18   Height:  6\' 1"  (1.854 m)    Weight:  58.8 kg (129 lb 10.1 oz)    SpO2: 100% 99% 98% 98%    Intake/Output Summary (Last 24 hours) at 02/26/13 1034 Last data filed at 02/26/13 0857  Gross per 24 hour  Intake    358 ml  Output   1100 ml  Net   -742 ml   Filed Weights   02/25/13 1722  Weight: 58.8 kg (129 lb 10.1 oz)     Exam:   General:  Wd, Thin, A&O, NAD, sitting on the side of the bed  Cardiovascular: RRR, no murmurs, rubs or gallops, no lower extremity edema  Respiratory: CTA, no wheeze, crackles, or rales.  No increased work of breathing.  Abdomen: Soft, non-tender, non-distended, + bowel sounds, no masses  Musculoskeletal: Able to move all 4 extremities, 5/5 strength in each  Neuro:  Still slightly slow to respond with ataxic gait, but otherwise non focal.  Data Reviewed: Basic Metabolic Panel:  Recent Labs Lab 02/24/13 2021 02/25/13 1810 02/26/13 0600  NA 146*  --  140  K 3.6  --  3.7  CL 113*  --  107  CO2 24  --  22  GLUCOSE 106*  --  86  BUN 13  --  11  CREATININE 1.04 0.88 0.84  CALCIUM 8.8  --  9.1   Liver Function Tests:  Recent Labs Lab 02/24/13 2021 02/26/13 0600  AST 23  --   ALT 17  --   ALKPHOS 55  --   BILITOT 0.5  --   PROT 7.0  --   ALBUMIN 3.6 3.3*   CBC:  Recent Labs Lab 02/24/13 2021 02/25/13 1810 02/26/13 0600  WBC 7.0 7.7 6.7  HGB 13.6 15.7 14.8  HCT 39.2 45.0 42.8  MCV 90.3 90.7 89.9  PLT 160 169 166   CBG:  Recent Labs Lab 02/24/13  1947  GLUCAP 107*      Studies: Dg Skull 1-3 Views  02/24/2013   *RADIOLOGY REPORT*  Clinical Data: Altered mental status.  Shunt series.  SKULL - 1-3 VIEW  Comparison: None.  Findings: Right parietal ventriculostomy is present.  There is no discontinuity of the shunt tubing identified.  Partially visualized wire projects over the cervical spine, presumably for fixation.  IMPRESSION: Left parietal ventriculostomy.  No shunt tubing discontinuity identified.   Original Report Authenticated By: Andreas Newport, M.D.   Dg Chest 1 View  02/24/2013   *RADIOLOGY REPORT*  Clinical Data: Altered mental status.  Shunt series.  Hypotension. Alcohol intoxication.  CHEST - 1 VIEW  Comparison: 01/22/2013.  Findings: Shunt tubing projects over the right chest.  This appears contiguous.  Lung volumes are lower than  on prior exam.  Basilar opacity is present which could represent aspiration pneumonitis in the setting of intoxication.  This appears more compatible with airspace disease than atelectasis.  Cardiopericardial silhouette appears similar.  IMPRESSION:  1.  Shunt tubing appears contiguous across the chest. 2.  Medial basilar airspace opacity suggestive of aspiration pneumonitis.  There is probably some superimposed atelectasis with lower lung volumes than on prior.   Original Report Authenticated By: Andreas Newport, M.D.   Dg Abd 1 View  02/24/2013   *RADIOLOGY REPORT*  Clinical Data: Hypotension.  Alcohol intoxication.  ABDOMEN - 1 VIEW  Comparison: 10/14/2009.  Findings: The shunt tubing appears contiguous and terminates in the anatomic pelvis.  There is a safety pin projected over the left anatomic pelvis, presumably external to the patient.  Recommend correlation with inspection.  IVC filter appears similar.  Bowel gas pattern is within normal limits.  No mass lesions or displacement of bowel loops.  IMPRESSION: Shunt tubing appears within normal limits.  Safety pin projects over the left pelvis, presumably external to the patient.   Original Report Authenticated By: Andreas Newport, M.D.   Ct Head Wo Contrast  02/24/2013   *RADIOLOGY REPORT*  Clinical Data: Altered mental status and found unresponsive.  CT HEAD WITHOUT CONTRAST  Technique:  Contiguous axial images were obtained from the base of the skull through the vertex without contrast.  Comparison: 01/22/2013  Findings: There is stable positioning of a right-sided ventriculostomy catheter.  Ventricular size is stable with no evidence of hydrocephalus.  Stable encephalomalacia is identified involving both frontal lobes, right greater than left with dystrophic calcification in the right frontal lobe. The brain demonstrates no evidence of hemorrhage, acute infarction, edema, mass effect, extra-axial fluid collection or mass lesion.  The skull is  unremarkable. Mucosal thickening present in ethmoid air cells and the visualized maxillary sinuses bilaterally.  IMPRESSION: Stable head CT without acute findings.  Ventriculostomy and ventricular size are stable.   Original Report Authenticated By: Irish Lack, M.D.    Scheduled Meds: . clopidogrel  75 mg Oral Daily  . enoxaparin (LOVENOX) injection  40 mg Subcutaneous Q24H  . folic acid  1 mg Oral Daily  . lisinopril  10 mg Oral Daily  . multivitamin with minerals  1 tablet Oral Daily  . nicotine  21 mg Transdermal Daily  . phenytoin  300 mg Oral QHS  . senna  1 tablet Oral BID  . sodium chloride  3 mL Intravenous Q12H  . thiamine  100 mg Oral Daily   Or  . thiamine  100 mg Intravenous Daily   Continuous Infusions:   Principal Problem:   Dilantin toxicity Active Problems:   Alcohol  abuse   Tobacco abuse   Noncompliance    Conley Canal  [facility] Pager [phone]. If 7PM-7AM, please contact night-coverage at [email or web address], password [facility] 02/26/2013, 10:34 AM  LOS: 2 days   Attending Seen and examined, the with the above assessment and plan. Symptomatically much better than yesterday, okay to resume Dilantin. Current disposition is to skilled nursing facility.  Miguel [name]
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
## 2013-02-26 NOTE — Progress Notes (Signed)
Rehab Admissions Coordinator Note:  Patient was screened by Clois Dupes for appropriateness for an Inpatient Acute Rehab Consult. Noted OT eval. Marissa Nestle, PA to follow up on Monday with his progress.  Clois Dupes 02/26/2013, 3:22 PM  I can be reached at [phone].
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
## 2013-02-26 NOTE — Care Management Note (Addendum)
    Page 1 of 2   03/01/2013     4:50:41 PM   CARE MANAGEMENT NOTE 03/01/2013  Patient:  Miguel [name], Miguel [name]   Account Number:  [id number]  Date Initiated:  02/26/2013  Documentation initiated by:  Letha Cape  Subjective/Objective Assessment:   dx alcohol detox; dilantin toxicity  admit- lives with grilfriend. ? Homeless.     Action/Plan:   Anticipated DC Date:  03/01/2013   Anticipated DC Plan:  HOME W HOME HEALTH SERVICES  In-house referral  Clinical Social Worker      DC Associate Professor  CM consult      [facility] Choice  HOME HEALTH   Choice offered to / List presented to:  C-1 Patient        HH arranged  HH-1 RN  HH-2 PT  HH-3 OT  HH-4 NURSE'S AIDE  HH-6 SOCIAL WORKER      HH agency  [facility]   Status of service:  Completed, signed off Medicare Important Message given?   (If response is "NO", the following Medicare IM given date fields will be blank) Date Medicare IM given:   Date Additional Medicare IM given:    Discharge Disposition:  SKILLED NURSING FACILITY  Per UR Regulation:  Reviewed for med. necessity/level of care/duration of stay  If discussed at Long Length of Stay Meetings, dates discussed:    Comments:  03/01/13 14:07 Letha Cape RN, BSN [phone] patient for dc to snf today, CSW following, patient has changed his mind and has decided to go home , chose [facility] , referral made to [facility] for [facility], PT, OT aide and Child psychotherapist.  Soc will begin 24-48 hrs post discharge.  Patient is for dc today, his girlfriend will be picking him up.  02/26/13 12:14 Letha Cape RN, BSN [phone] patient states he lives with his grifriend.  Per physical therapy recs CIR, CIR recs SNF, CSW referral.

## 2013-02-27 NOTE — Progress Notes (Signed)
Weekend CSW presented pt with bed offers from SNF. Pt seemed agreeable and agreed to review offers. Weekday CSW to follow.  Belenda Cruise Faithlyn Recktenwald, LCSWA

## 2013-02-27 NOTE — Progress Notes (Signed)
Pt oob to chair with assist x 1, pt working with PT, pt CIWA score 0, pt has not had any c/o pain, will continue to monitor

## 2013-02-27 NOTE — Progress Notes (Signed)
Physical Therapy Treatment Patient Details Name: Miguel [name] MRN: [id number] DOB: 1951-03-03 Today's Date: 02/27/2013 Time: 1478-2956 PT Time Calculation (min): 18 min  PT Assessment / Plan / Recommendation  History of Present Illness 62 y.o. male who is homeless and has a history of alcoholism, IVP shunt, Seizures, Hypertension, and CVA. Per the ED RN notes was found down on 7/30 on the side of the road.  EMS reports he was hypotensive, had difficulty speaking and an unsteady gait.  He was brought to the ED and found to have an alcohol level of 248 and an undetectable phenytoin level.  He was given 1250 mg of phenytoin and IVF in the ED.  He was kept overnight in order to sober up.  On Thursday (7/31) the EDP attempted to discharge the patient, but discovered he had an ataxic gait.  Repeat phenytoin level was 26 (elevated).   CT head is negative with a stable ventriculostomy, chest xray mentions possible aspiration pneumonitis.      PT Comments   Patient is making good progress with PT.  Still limited in ambulation without assistive device - limited by unstable gait and ataxia.  Did much better with RW, may need RW at discharge, depending on d/c disposition.  Will need continued work on balance and coordination of movement.   Follow Up Recommendations  SNF;Supervision/Assistance - 24 hour;CIR     Does the patient have the potential to tolerate intense rehabilitation     Barriers to Discharge        Equipment Recommendations  Rolling walker with 5" wheels    Recommendations for Other Services    Frequency Min 3X/week   Progress towards PT Goals Progress towards PT goals: Progressing toward goals  Plan Current plan remains appropriate    Precautions / Restrictions     Pertinent Vitals/Pain No pain    Mobility  Bed Mobility Bed Mobility: Sit to Supine Sit to Supine: 7: Independent Transfers Transfers: Sit to Stand;Stand to Sit Sit to Stand: 4: Min guard Stand to Sit: 4: Min  guard Details for Transfer Assistance: min guard for balance Ambulation/Gait Ambulation/Gait Assistance: 3: Mod assist Ambulation Distance (Feet): 10 Feet Assistive device: 1 person hand held assist Ambulation/Gait Assistance Details: attempted ambulation without device - patient with unstable gait, slight ataxia of trunk and LE's.  Did much better with RW, requiring only min-guard assist.      Exercises General Exercises - Lower Extremity Ankle Circles/Pumps: AROM;Both;10 reps;Seated Long Arc Quad: AROM;Both;10 reps;Seated Hip Flexion/Marching: AROM;Both;10 reps;Seated   PT Diagnosis:    PT Problem List:   PT Treatment Interventions:     PT Goals (current goals can now be found in the care plan section)    Visit Information  Last PT Received On: 02/27/13 Assistance Needed: +1 History of Present Illness: 62 y.o. male who is homeless and has a history of alcoholism, IVP shunt, Seizures, Hypertension, and CVA. Per the ED RN notes was found down on 7/30 on the side of the road.  EMS reports he was hypotensive, had difficulty speaking and an unsteady gait.  He was brought to the ED and found to have an alcohol level of 248 and an undetectable phenytoin level.  He was given 1250 mg of phenytoin and IVF in the ED.  He was kept overnight in order to sober up.  On Thursday (7/31) the EDP attempted to discharge the patient, but discovered he had an ataxic gait.  Repeat phenytoin level was 26 (elevated).  CT head is negative with a stable ventriculostomy, chest xray mentions possible aspiration pneumonitis.       Subjective Data      Cognition  Cognition Arousal/Alertness: Awake/alert Behavior During Therapy: WFL for tasks assessed/performed Overall Cognitive Status: Impaired/Different from baseline Area of Impairment: Problem solving;Awareness    Balance     End of Session PT - End of Session Equipment Utilized During Treatment: Gait belt Activity Tolerance: Patient tolerated treatment  well Patient left: in bed;with bed alarm set   GP     Olivia Canter, [place] [phone] 02/27/2013, 12:38 PM

## 2013-02-27 NOTE — Progress Notes (Signed)
PATIENT DETAILS Name: Miguel [name] Age: 62 y.o. Sex: male Date of Birth: [birth year] Admit Date: 02/24/2013 Admitting Physician Dewayne Shorter Levora Dredge, MD [id number],AOZHY A, MD  Subjective: No major issues  Assessment/Plan: Ataxia  -Suspect this is likely more from phenytoin toxicity, although EtOH intoxication could have been contributing  -CT head negative  -PT Recommending SNF vs CIR for rehabilitation   Dilantin toxicity  - Likely causing the ataxia on admission  -Level normalizing  -Pharmacy dosing.  -Problem with non-compliance   Alcohol abuse  -Concern for alcohol withdrawL. 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   Dementia  -Aricept and Namenda held as they may contribute to decreased mental status on admission-will resume  Disposition: Remain inpatient  DVT Prophylaxis: Prophylactic Lovenox   Code Status: Full code   Family Communication None  Procedures:  None  CONSULTS:  None   MEDICATIONS: Scheduled Meds: . clopidogrel  75 mg Oral Daily  . donepezil  10 mg Oral QHS  . enoxaparin (LOVENOX) injection  40 mg Subcutaneous Q24H  . feeding supplement  237 mL Oral TID BM  . feeding supplement  1 Container Oral TID BM  . folic acid  1 mg Oral Daily  . lisinopril  10 mg Oral Daily  . memantine  10 mg Oral BID  . multivitamin with minerals  1 tablet Oral Daily  . nicotine  21 mg Transdermal Daily  . phenytoin  300 mg Oral QHS  . senna  1 tablet Oral BID  . sodium chloride  3 mL Intravenous Q12H  . thiamine  100 mg Oral Daily   Or  . thiamine  100 mg Intravenous Daily   Continuous Infusions:  PRN Meds:.acetaminophen, acetaminophen, albuterol, alum & mag hydroxide-simeth, diclofenac, LORazepam, LORazepam,  ondansetron (ZOFRAN) IV, ondansetron  Antibiotics: Anti-infectives   None       PHYSICAL EXAM: Vital signs in last 24 hours: Filed Vitals:   02/27/13 0715 02/27/13 0717 02/27/13 0939 02/27/13 1429  BP: 146/89 142/92 146/86 136/87  Pulse: 67 71  68  Temp:    97.5 F (36.4 C)  TempSrc:    Oral  Resp: 18 18  16   Height:      Weight:      SpO2: 97%   98%    Weight change:  Filed Weights   02/25/13 1722  Weight: 58.8 kg (129 lb 10.1 oz)   Body mass index is 17.11 kg/(m^2).   Gen Exam: Awake and alert with clear speech.   Neck: Supple, No JVD.   Chest: B/L Clear.   CVS: S1 S2 Regular, no murmurs.  Abdomen: soft, BS +, non tender, non distended.  Extremities: no edema, lower extremities warm to touch Neurologic: Non Focal.   Skin: No Rash.   Wounds: N/A.    Intake/Output from previous day:  Intake/Output Summary (Last 24 hours) at 02/27/13 1430 Last data filed at 02/27/13 0908  Gross per 24 hour  Intake    360 ml  Output   1125 ml  Net   -765 ml     LAB RESULTS: CBC  Recent Labs Lab 02/24/13 2021 02/25/13 1810 02/26/13 0600  WBC 7.0 7.7 6.7  HGB 13.6 15.7 14.8  HCT 39.2 45.0  42.8  PLT 160 169 166  MCV 90.3 90.7 89.9  MCH 31.3 31.7 31.1  MCHC 34.7 34.9 34.6  RDW 14.5 14.7 14.5    Chemistries   Recent Labs Lab 02/24/13 2021 02/25/13 1810 02/26/13 0600  NA 146*  --  140  K 3.6  --  3.7  CL 113*  --  107  CO2 24  --  22  GLUCOSE 106*  --  86  BUN 13  --  11  CREATININE 1.04 0.88 0.84  CALCIUM 8.8  --  9.1    CBG:  Recent Labs Lab 02/24/13 1947  GLUCAP 107*    GFR Estimated Creatinine Clearance: 76.8 ml/min (by C-G formula based on Cr of 0.84).  Coagulation profile No results found for this basename: INR, PROTIME,  in the last 168 hours  Cardiac Enzymes No results found for this basename: CK, CKMB, TROPONINI, MYOGLOBIN,  in the last 168 hours  No components found with this basename: POCBNP,  No results found for this  basename: DDIMER,  in the last 72 hours No results found for this basename: HGBA1C,  in the last 72 hours No results found for this basename: CHOL, HDL, LDLCALC, TRIG, CHOLHDL, LDLDIRECT,  in the last 72 hours No results found for this basename: TSH, T4TOTAL, FREET3, T3FREE, THYROIDAB,  in the last 72 hours No results found for this basename: VITAMINB12, FOLATE, FERRITIN, TIBC, IRON, RETICCTPCT,  in the last 72 hours No results found for this basename: LIPASE, AMYLASE,  in the last 72 hours  Urine Studies No results found for this basename: UACOL, UAPR, USPG, UPH, UTP, UGL, UKET, UBIL, UHGB, UNIT, UROB, ULEU, UEPI, UWBC, URBC, UBAC, CAST, CRYS, UCOM, BILUA,  in the last 72 hours  MICROBIOLOGY: No results found for this or any previous visit (from the past 240 hour(s)).  RADIOLOGY STUDIES/RESULTS: Dg Skull 1-3 Views  02/24/2013   *RADIOLOGY REPORT*  Clinical Data: Altered mental status.  Shunt series.  SKULL - 1-3 VIEW  Comparison: None.  Findings: Right parietal ventriculostomy is present.  There is no discontinuity of the shunt tubing identified.  Partially visualized wire projects over the cervical spine, presumably for fixation.  IMPRESSION: Left parietal ventriculostomy.  No shunt tubing discontinuity identified.   Original Report Authenticated By: Andreas Newport, M.D.   Dg Chest 1 View  02/24/2013   *RADIOLOGY REPORT*  Clinical Data: Altered mental status.  Shunt series.  Hypotension. Alcohol intoxication.  CHEST - 1 VIEW  Comparison: 01/22/2013.  Findings: Shunt tubing projects over the right chest.  This appears contiguous.  Lung volumes are lower than on prior exam.  Basilar opacity is present which could represent aspiration pneumonitis in the setting of intoxication.  This appears more compatible with airspace disease than atelectasis.  Cardiopericardial silhouette appears similar.  IMPRESSION:  1.  Shunt tubing appears contiguous across the chest. 2.  Medial basilar airspace opacity  suggestive of aspiration pneumonitis.  There is probably some superimposed atelectasis with lower lung volumes than on prior.   Original Report Authenticated By: Andreas Newport, M.D.   Dg Abd 1 View  02/24/2013   *RADIOLOGY REPORT*  Clinical Data: Hypotension.  Alcohol intoxication.  ABDOMEN - 1 VIEW  Comparison: 10/14/2009.  Findings: The shunt tubing appears contiguous and terminates in the anatomic pelvis.  There is a safety pin projected over the left anatomic pelvis, presumably external to the patient.  Recommend correlation with inspection.  IVC filter appears similar.  Bowel gas pattern is within normal limits.  No mass lesions or displacement of bowel loops.  IMPRESSION: Shunt tubing appears within normal limits.  Safety pin projects over the left pelvis, presumably external to the patient.   Original Report Authenticated By: Andreas Newport, M.D.   Ct Head Wo Contrast  02/24/2013   *RADIOLOGY REPORT*  Clinical Data: Altered mental status and found unresponsive.  CT HEAD WITHOUT CONTRAST  Technique:  Contiguous axial images were obtained from the base of the skull through the vertex without contrast.  Comparison: 01/22/2013  Findings: There is stable positioning of a right-sided ventriculostomy catheter.  Ventricular size is stable with no evidence of hydrocephalus.  Stable encephalomalacia is identified involving both frontal lobes, right greater than left with dystrophic calcification in the right frontal lobe. The brain demonstrates no evidence of hemorrhage, acute infarction, edema, mass effect, extra-axial fluid collection or mass lesion.  The skull is unremarkable. Mucosal thickening present in ethmoid air cells and the visualized maxillary sinuses bilaterally.  IMPRESSION: Stable head CT without acute findings.  Ventriculostomy and ventricular size are stable.   Original Report Authenticated By: Irish Lack, M.D.    Jeoffrey Massed, MD  [facility] Pager:[phone]  If  7PM-7AM, please contact night-coverage [email or web address] Password [email or web address] 02/27/2013, 2:30 PM   LOS: 3 days

## 2013-02-28 NOTE — Progress Notes (Signed)
PATIENT DETAILS Name: Miguel [name] Age: 62 y.o. Sex: male Date of Birth: Jul 21, 1951 Admit Date: 02/24/2013 Admitting Physician Dewayne Shorter Levora Dredge, MD ZOX:[id number],UJWJX A, MD  Subjective: No major issues, awake and alert-awaiting SNF bed  Assessment/Plan: Ataxia  -Suspect this is likely more from phenytoin toxicity, although EtOH intoxication could have been contributing  -CT head negative  -PT Recommending SNF vs CIR for rehabilitation   Dilantin toxicity  - Likely causing the ataxia on admission  -Level normalizing  -Pharmacy dosing.  -Problem with non-compliance   Alcohol abuse  -Concern for alcohol withdrawL. 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   Dementia  -Aricept and Namenda held as they may contribute to decreased mental status on admission-will resume  Underweight - Appreciate nutrition consultation, on appropriate supplementation  Disposition: Remain inpatient-SNF when bed available  DVT Prophylaxis: Prophylactic Lovenox   Code Status: Full code   Family Communication None  Procedures:  None  CONSULTS:  None   MEDICATIONS: Scheduled Meds: . clopidogrel  75 mg Oral Daily  . donepezil  10 mg Oral QHS  . enoxaparin (LOVENOX) injection  40 mg Subcutaneous Q24H  . feeding supplement  237 mL Oral TID BM  . feeding supplement  1 Container Oral TID BM  . folic acid  1 mg Oral Daily  . lisinopril  10 mg Oral Daily  . memantine  10 mg Oral BID  . multivitamin with minerals  1 tablet Oral Daily  . nicotine  21 mg Transdermal Daily  . phenytoin  300 mg Oral QHS  . senna  1 tablet Oral BID  . sodium chloride  3 mL Intravenous Q12H  . thiamine  100 mg Oral Daily   Or  . thiamine  100 mg Intravenous Daily    Continuous Infusions:  PRN Meds:.acetaminophen, acetaminophen, albuterol, alum & mag hydroxide-simeth, diclofenac, LORazepam, LORazepam, ondansetron (ZOFRAN) IV, ondansetron  Antibiotics: Anti-infectives   None       PHYSICAL EXAM: Vital signs in last 24 hours: Filed Vitals:   02/27/13 2116 02/28/13 0555 02/28/13 0558 02/28/13 0559  BP: 142/79 116/75 113/78 117/82  Pulse: 80 65    Temp: 98.2 F (36.8 C) 98.2 F (36.8 C)    TempSrc: Oral Oral    Resp: 18 18    Height:      Weight:      SpO2: 99% 100% 96% 95%    Weight change:  Filed Weights   02/25/13 1722  Weight: 58.8 kg (129 lb 10.1 oz)   Body mass index is 17.11 kg/(m^2).   Gen Exam: Awake and alert with clear speech.   Neck: Supple, No JVD.   Chest: B/L Clear.   CVS: S1 S2 Regular, no murmurs.  Abdomen: soft, BS +, non tender, non distended.  Extremities: no edema, lower extremities warm to touch Neurologic: Non Focal.   Skin: No Rash.   Wounds: N/A.    Intake/Output from previous day:  Intake/Output Summary (Last 24 hours) at 02/28/13 1149 Last data filed at 02/28/13 0225  Gross per 24 hour  Intake    240 ml  Output    400 ml  Net   -160 ml     LAB RESULTS: CBC  Recent Labs  Lab 02/24/13 2021 02/25/13 1810 02/26/13 0600  WBC 7.0 7.7 6.7  HGB 13.6 15.7 14.8  HCT 39.2 45.0 42.8  PLT 160 169 166  MCV 90.3 90.7 89.9  MCH 31.3 31.7 31.1  MCHC 34.7 34.9 34.6  RDW 14.5 14.7 14.5    Chemistries   Recent Labs Lab 02/24/13 2021 02/25/13 1810 02/26/13 0600  NA 146*  --  140  K 3.6  --  3.7  CL 113*  --  107  CO2 24  --  22  GLUCOSE 106*  --  86  BUN 13  --  11  CREATININE 1.04 0.88 0.84  CALCIUM 8.8  --  9.1    CBG:  Recent Labs Lab 02/24/13 1947  GLUCAP 107*    GFR Estimated Creatinine Clearance: 76.8 ml/min (by C-G formula based on Cr of 0.84).  Coagulation profile No results found for this basename: INR, PROTIME,  in the last 168 hours  Cardiac Enzymes No results  found for this basename: CK, CKMB, TROPONINI, MYOGLOBIN,  in the last 168 hours  No components found with this basename: POCBNP,  No results found for this basename: DDIMER,  in the last 72 hours No results found for this basename: HGBA1C,  in the last 72 hours No results found for this basename: CHOL, HDL, LDLCALC, TRIG, CHOLHDL, LDLDIRECT,  in the last 72 hours No results found for this basename: TSH, T4TOTAL, FREET3, T3FREE, THYROIDAB,  in the last 72 hours No results found for this basename: VITAMINB12, FOLATE, FERRITIN, TIBC, IRON, RETICCTPCT,  in the last 72 hours No results found for this basename: LIPASE, AMYLASE,  in the last 72 hours  Urine Studies No results found for this basename: UACOL, UAPR, USPG, UPH, UTP, UGL, UKET, UBIL, UHGB, UNIT, UROB, ULEU, UEPI, UWBC, URBC, UBAC, CAST, CRYS, UCOM, BILUA,  in the last 72 hours  MICROBIOLOGY: No results found for this or any previous visit (from the past 240 hour(s)).  RADIOLOGY STUDIES/RESULTS: Dg Skull 1-3 Views  02/24/2013   *RADIOLOGY REPORT*  Clinical Data: Altered mental status.  Shunt series.  SKULL - 1-3 VIEW  Comparison: None.  Findings: Right parietal ventriculostomy is present.  There is no discontinuity of the shunt tubing identified.  Partially visualized wire projects over the cervical spine, presumably for fixation.  IMPRESSION: Left parietal ventriculostomy.  No shunt tubing discontinuity identified.   Original Report Authenticated By: Andreas Newport, M.D.   Dg Chest 1 View  02/24/2013   *RADIOLOGY REPORT*  Clinical Data: Altered mental status.  Shunt series.  Hypotension. Alcohol intoxication.  CHEST - 1 VIEW  Comparison: 01/22/2013.  Findings: Shunt tubing projects over the right chest.  This appears contiguous.  Lung volumes are lower than on prior exam.  Basilar opacity is present which could represent aspiration pneumonitis in the setting of intoxication.  This appears more compatible with airspace disease than  atelectasis.  Cardiopericardial silhouette appears similar.  IMPRESSION:  1.  Shunt tubing appears contiguous across the chest. 2.  Medial basilar airspace opacity suggestive of aspiration pneumonitis.  There is probably some superimposed atelectasis with lower lung volumes than on prior.   Original Report Authenticated By: Andreas Newport, M.D.   Dg Abd 1 View  02/24/2013   *RADIOLOGY REPORT*  Clinical Data: Hypotension.  Alcohol intoxication.  ABDOMEN - 1 VIEW  Comparison: 10/14/2009.  Findings: The shunt tubing appears contiguous and terminates in the anatomic pelvis.  There is a safety pin projected over the left anatomic pelvis, presumably external  to the patient.  Recommend correlation with inspection.  IVC filter appears similar.  Bowel gas pattern is within normal limits.  No mass lesions or displacement of bowel loops.  IMPRESSION: Shunt tubing appears within normal limits.  Safety pin projects over the left pelvis, presumably external to the patient.   Original Report Authenticated By: Andreas Newport, M.D.   Ct Head Wo Contrast  02/24/2013   *RADIOLOGY REPORT*  Clinical Data: Altered mental status and found unresponsive.  CT HEAD WITHOUT CONTRAST  Technique:  Contiguous axial images were obtained from the base of the skull through the vertex without contrast.  Comparison: 01/22/2013  Findings: There is stable positioning of a right-sided ventriculostomy catheter.  Ventricular size is stable with no evidence of hydrocephalus.  Stable encephalomalacia is identified involving both frontal lobes, right greater than left with dystrophic calcification in the right frontal lobe. The brain demonstrates no evidence of hemorrhage, acute infarction, edema, mass effect, extra-axial fluid collection or mass lesion.  The skull is unremarkable. Mucosal thickening present in ethmoid air cells and the visualized maxillary sinuses bilaterally.  IMPRESSION: Stable head CT without acute findings.  Ventriculostomy and  ventricular size are stable.   Original Report Authenticated By: Irish Lack, M.D.    Jeoffrey Massed, MD  [facility] Pager:[phone]  If 7PM-7AM, please contact night-coverage [email or web address] Password [email or web address] 02/28/2013, 11:49 AM   LOS: 4 days

## 2013-03-01 MED ORDER — FOLIC ACID 1 MG PO TABS: 1.0000 mg | ORAL_TABLET | Freq: Every day | ORAL | Status: DC

## 2013-03-01 MED ORDER — SENNA 8.6 MG PO TABS: 1.0000 | ORAL_TABLET | Freq: Two times a day (BID) | ORAL | Status: DC

## 2013-03-01 MED ORDER — THIAMINE HCL 100 MG PO TABS: 100.0000 mg | ORAL_TABLET | Freq: Every day | ORAL | Status: DC

## 2013-03-01 MED ORDER — DICLOFENAC SODIUM 75 MG PO TBEC: 75.0000 mg | DELAYED_RELEASE_TABLET | Freq: Two times a day (BID) | ORAL | Status: DC

## 2013-03-01 MED ORDER — CLOPIDOGREL BISULFATE 75 MG PO TABS: 75.0000 mg | ORAL_TABLET | Freq: Every day | ORAL | Status: DC

## 2013-03-01 MED ORDER — MEMANTINE HCL 10 MG PO TABS: 10.0000 mg | ORAL_TABLET | Freq: Two times a day (BID) | ORAL | Status: DC

## 2013-03-01 MED ORDER — NICOTINE 21 MG/24HR TD PT24: 1.0000 | MEDICATED_PATCH | Freq: Every day | TRANSDERMAL | Status: DC

## 2013-03-01 MED ORDER — ENSURE COMPLETE PO LIQD: 237.0000 mL | Freq: Three times a day (TID) | ORAL | Status: DC

## 2013-03-01 MED ORDER — ADULT MULTIVITAMIN W/MINERALS CH: 1.0000 | ORAL_TABLET | Freq: Every day | ORAL | Status: DC

## 2013-03-01 MED ORDER — PHENYTOIN SODIUM EXTENDED 100 MG PO CAPS: 300.0000 mg | ORAL_CAPSULE | Freq: Every day | ORAL | Status: DC

## 2013-03-01 MED ORDER — LISINOPRIL 10 MG PO TABS: 10.0000 mg | ORAL_TABLET | Freq: Every day | ORAL | Status: DC

## 2013-03-01 MED ORDER — ACETAMINOPHEN 325 MG PO TABS: 650.0000 mg | ORAL_TABLET | Freq: Four times a day (QID) | ORAL | Status: DC | PRN

## 2013-03-01 MED ORDER — DONEPEZIL HCL 10 MG PO TABS: 10.0000 mg | ORAL_TABLET | Freq: Every day | ORAL | Status: DC

## 2013-03-01 NOTE — Progress Notes (Signed)
.    NURSING PROGRESS NOTE  Patient changed his mind about going to SNF  Miguel [name] [id number] Discharge Data: 03/01/2013 5:23 PM Attending Provider: No att. providers found [id number],NGEXB A, MD     [name] to be D/C'd Home per MD order.    All IV's discontinued with no bleeding noted.  All belongings returned to patient for patient to take home.   Last Vital Signs:  Blood pressure 124/74, pulse 76, temperature 97.6 F (36.4 C), temperature source Oral, resp. rate 20, height 6\' 1"  (1.854 m), weight 58.8 kg (129 lb 10.1 oz), SpO2 98.00%.  Discharge Medication List   Medication List    STOP taking these medications       ibuprofen 800 MG tablet  Commonly known as:  ADVIL,MOTRIN      TAKE these medications       acetaminophen 325 MG tablet  Commonly known as:  TYLENOL  Take 2 tablets (650 mg total) by mouth every 6 (six) hours as needed.     albuterol 108 (90 BASE) MCG/ACT inhaler  Commonly known as:  PROVENTIL HFA;VENTOLIN HFA  Inhale 2 puffs into the lungs every 6 (six) hours as needed for wheezing.     clopidogrel 75 MG tablet  Commonly known as:  PLAVIX  Take 1 tablet (75 mg total) by mouth daily.     diclofenac 75 MG EC tablet  Commonly known as:  VOLTAREN  Take 1 tablet (75 mg total) by mouth 2 (two) times daily.     donepezil 10 MG tablet  Commonly known as:  ARICEPT  Take 1 tablet (10 mg total) by mouth at bedtime.     feeding supplement Liqd  Take 237 mLs by mouth 3 (three) times daily between meals.     folic acid 1 MG tablet  Commonly known as:  FOLVITE  Take 1 tablet (1 mg total) by mouth daily.     lisinopril 10 MG tablet  Commonly known as:  PRINIVIL,ZESTRIL  Take 1 tablet (10 mg total) by mouth daily.     memantine 10 MG tablet  Commonly known as:  NAMENDA  Take 1 tablet (10 mg total) by mouth 2 (two) times daily.     multivitamin with minerals Tabs tablet  Take 1 tablet by mouth daily.     nicotine 21 mg/24hr patch  Commonly  known as:  NICODERM CQ - dosed in mg/24 hours  Place 1 patch onto the skin daily.     phenytoin 100 MG ER capsule  Commonly known as:  DILANTIN  Take 3 capsules (300 mg total) by mouth at bedtime.     thiamine 100 MG tablet  Take 1 tablet (100 mg total) by mouth daily.        Madelin Rear, MSN, RN, [employer]

## 2013-03-01 NOTE — Clinical Social Work Placement (Signed)
Clinical Social Work Department CLINICAL SOCIAL WORK PLACEMENT NOTE 03/01/2013  Patient:  Miguel [name], Miguel [name]  Account Number:  [id number] Admit date:  02/24/2013  Clinical Social Worker:  Cherre Blanc, [place]  Date/time:  02/26/2013 01:48 PM  Clinical Social Work is seeking post-discharge placement for this patient at the following level of care:   SKILLED NURSING   (*CSW will update this form in Epic as items are completed)   02/26/2013  Patient/family provided with [facility] Department of Clinical Social Work's list of facilities offering this level of care within the geographic area requested by the patient (or if unable, by the patient's family).  02/26/2013  Patient/family informed of their freedom to choose among providers that offer the needed level of care, that participate in Medicare, Medicaid or managed care program needed by the patient, have an available bed and are willing to accept the patient.  02/26/2013  Patient/family informed of [facility]' ownership interest in [facility], as well as of the fact that they are under no obligation to receive care at this facility.  PASARR submitted to EDS on 02/26/2013 PASARR number received from EDS on 02/26/2013  FL2 transmitted to all facilities in geographic area requested by pt/family on  02/26/2013 FL2 transmitted to all facilities within larger geographic area on   Patient informed that his/her managed care company has contracts with or will negotiate with  certain facilities, including the following:     Patient/family informed of bed offers received:  02/27/2013 Patient chooses bed at [facility] Physician recommends and patient chooses bed at    Patient to be transferred to OTHER on  03/01/2013 Patient to be transferred to facility by   The following physician request were entered in Epic:   Additional Comments: Patient refused to be transported to [place] upon DC. Patient stated  that he did not want to go despite being onboard with DC plan until EMS arrival. Patient DC to girlfriend's home. RNCM notified and HH/PT set up for patient. CSW explained to patient that he should follow the recommendation for SNF, but patient still refused. Patient was given written prescriptions upon DC. CSW signing off.   Roddie Mc, [place], [place], [phone]

## 2013-03-01 NOTE — Progress Notes (Signed)
Patient now requests he be discharged home, he wants to go home instead. Spoke with the Patients girlfiend, who he lives with, she can take care of him 24/7. Although with dementia-he is awake and alert. He is concerned about missing his social security payments in case he goes to a SNF. Have arranged to follow up at Aug 12 at 4:15 pm on 8/12

## 2013-03-01 NOTE — Progress Notes (Signed)
Patient discharging to [facility]) for rehab.  Called report to [place] @ [place].  Admission staff from [place] requested that we delay discharge until 15:45.  Will monitor patient until transport arrives.  Peri Maris, MBA, BS, RN

## 2013-03-01 NOTE — Progress Notes (Signed)
Discussed mobility issues with patient and nurse. They both report that he's at supervision level. Has been walking to the bathroom without assistive device. Patient reports that he has premorbid LLE weakness and uses cane occasionally. He's agreeable to SNF. Will defer CIR consult.
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
## 2013-03-01 NOTE — Progress Notes (Signed)
OT Cancellation Note  Patient Details Name: Miguel [name] MRN: [id number] DOB: 03-13-1951   Cancelled Treatment:      OT cx today secondary to pt being d/c to SNF for rehab today. Will sign off acute OT at this time.  Roselie Awkward Dixon 03/01/2013, 10:52 AM

## 2013-03-09 ENCOUNTER — Inpatient Hospital Stay: Payer: Medicare Other

## 2013-07-12 ENCOUNTER — Encounter (HOSPITAL_COMMUNITY): Payer: Self-pay | Admitting: Emergency Medicine

## 2013-07-12 ENCOUNTER — Emergency Department (HOSPITAL_COMMUNITY): Payer: Medicare Other

## 2013-07-12 ENCOUNTER — Emergency Department (HOSPITAL_COMMUNITY)
Admission: EM | Admit: 2013-07-12 | Discharge: 2013-07-12 | Disposition: A | Discharge status: Home / Self Care | Payer: Medicare Other | Attending: Emergency Medicine | Admitting: Emergency Medicine

## 2013-07-12 DIAGNOSIS — Z7902 Long term (current) use of antithrombotics/antiplatelets: Secondary | ICD-10-CM | POA: Insufficient documentation

## 2013-07-12 DIAGNOSIS — R600 Localized edema: Secondary | ICD-10-CM

## 2013-07-12 DIAGNOSIS — I10 Essential (primary) hypertension: Secondary | ICD-10-CM | POA: Insufficient documentation

## 2013-07-12 DIAGNOSIS — R609 Edema, unspecified: Secondary | ICD-10-CM | POA: Insufficient documentation

## 2013-07-12 DIAGNOSIS — Z791 Long term (current) use of non-steroidal anti-inflammatories (NSAID): Secondary | ICD-10-CM | POA: Insufficient documentation

## 2013-07-12 DIAGNOSIS — Z79899 Other long term (current) drug therapy: Secondary | ICD-10-CM | POA: Insufficient documentation

## 2013-07-12 DIAGNOSIS — G40909 Epilepsy, unspecified, not intractable, without status epilepticus: Secondary | ICD-10-CM | POA: Insufficient documentation

## 2013-07-12 DIAGNOSIS — F17200 Nicotine dependence, unspecified, uncomplicated: Secondary | ICD-10-CM | POA: Insufficient documentation

## 2013-07-12 DIAGNOSIS — Z8673 Personal history of transient ischemic attack (TIA), and cerebral infarction without residual deficits: Secondary | ICD-10-CM | POA: Insufficient documentation

## 2013-07-12 DIAGNOSIS — Z982 Presence of cerebrospinal fluid drainage device: Secondary | ICD-10-CM | POA: Insufficient documentation

## 2013-07-12 LAB — BASIC METABOLIC PANEL
BUN: 12 mg/dL (ref 6–23)
CO2: 26 mEq/L (ref 19–32)
Chloride: 106 mEq/L (ref 96–112)
Creatinine, Ser: 0.93 mg/dL (ref 0.50–1.35)
GFR calc Af Amer: >90 mL/min (ref >90)
GFR calc non Af Amer: 88 mL/min — ABNORMAL LOW (ref >90)
Glucose, Bld: 91 mg/dL (ref 70–99)
Potassium: 4.9 mEq/L (ref 3.5–5.1)
Sodium: 142 mEq/L (ref 135–145)

## 2013-07-12 LAB — CBC
HCT: 36.3 % — ABNORMAL LOW (ref 39.0–52.0)
Hemoglobin: 12 g/dL — ABNORMAL LOW (ref 13.0–17.0)
MCHC: 33.1 g/dL (ref 30.0–36.0)
MCV: 93.1 fL (ref 78.0–100.0)
RDW: 14.4 % (ref 11.5–15.5)

## 2013-07-12 LAB — PRO B NATRIURETIC PEPTIDE: Pro B Natriuretic peptide (BNP): 152.4 pg/mL — ABNORMAL HIGH (ref 0–125)

## 2013-07-12 MED ORDER — MORPHINE SULFATE 4 MG/ML IJ SOLN
4.0000 mg | Freq: Once | INTRAMUSCULAR | Status: AC
  Administered 2013-07-12: 4 mg via INTRAVENOUS
  Filled 2013-07-12: qty 1

## 2013-07-12 MED ORDER — HYDROCODONE-ACETAMINOPHEN 5-325 MG PO TABS: 1.0000 | ORAL_TABLET | ORAL | Status: DC | PRN

## 2013-07-12 MED ORDER — FUROSEMIDE 20 MG PO TABS: 20.0000 mg | ORAL_TABLET | Freq: Every day | ORAL | Status: DC

## 2013-07-12 MED ORDER — FUROSEMIDE 10 MG/ML IJ SOLN
40.0000 mg | Freq: Once | INTRAMUSCULAR | Status: AC
  Administered 2013-07-12: 40 mg via INTRAVENOUS
  Filled 2013-07-12: qty 4

## 2013-07-12 NOTE — ED Notes (Signed)
RN and EDP attempted to draw labs.  Phlebotomy called to draw.
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
## 2013-07-12 NOTE — ED Notes (Signed)
C/o BLE edema and pain x 3 days. Denies SOB or CP.
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
## 2013-10-30 IMAGING — CT CT HEAD W/O CM
1 series · 16 of 30 positions shown, 20 images · non-contrast
Comparison: CT 07/03/2012

CLINICAL DATA: Possible seizure.

CT HEAD WITHOUT CONTRAST
TECHNIQUE: Contiguous axial images were obtained from the base of
the skull through the vertex without contrast.

[Series 2: (id) head 4.8 h37s st · axial · 0.54mm/px · z∈[+1215,+1375]mm · 16 of 36 slices shown, 20 images]
[im 2/36  brain]
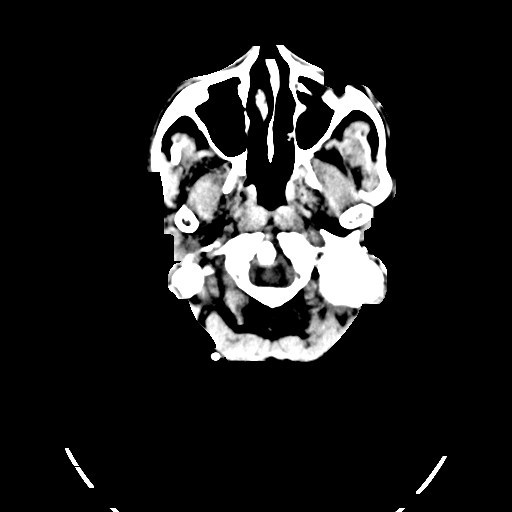
[im 2/36  bone]
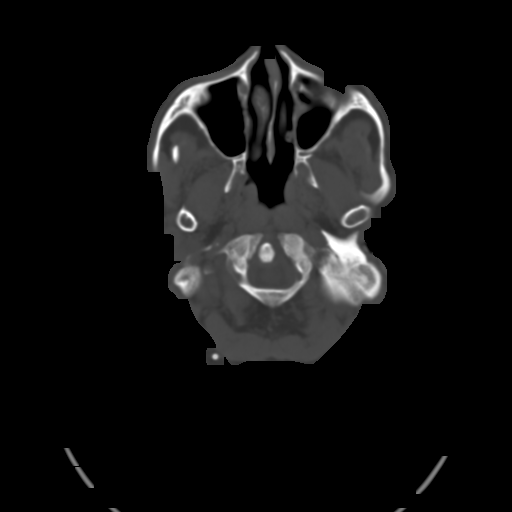
[im 4/36  brain]
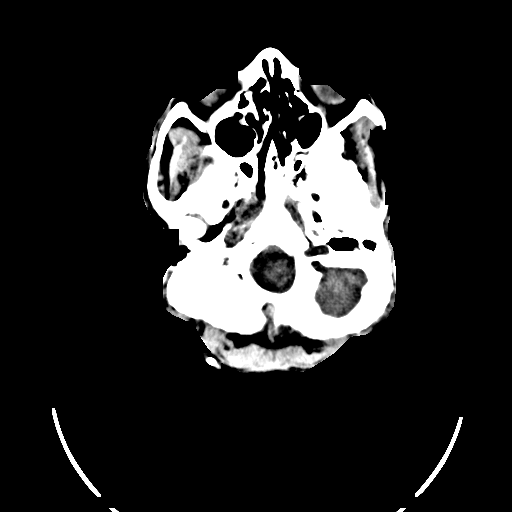
[im 7/36  brain]
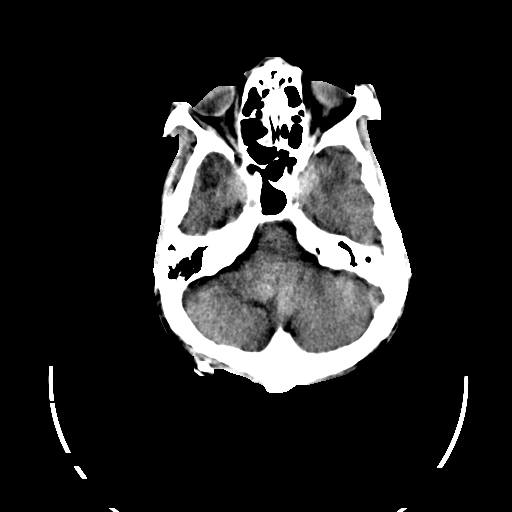
[im 9/36  brain]
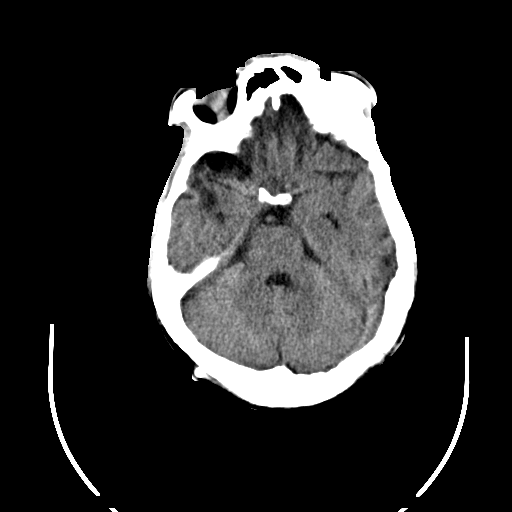
[im 10/36  brain]
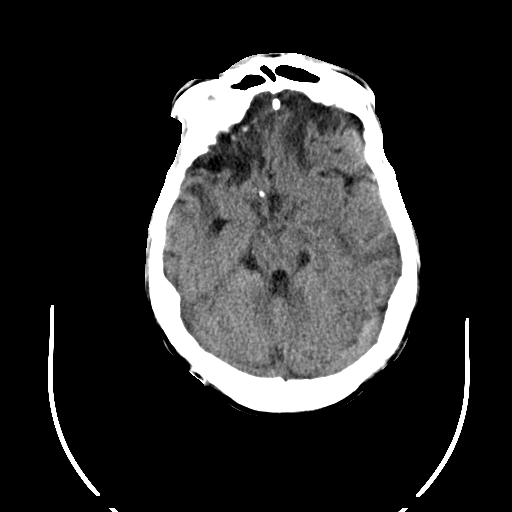
[im 10/36  bone]
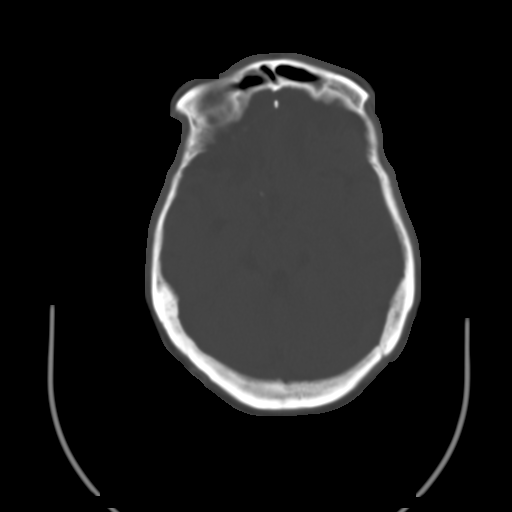
[im 13/36  brain]
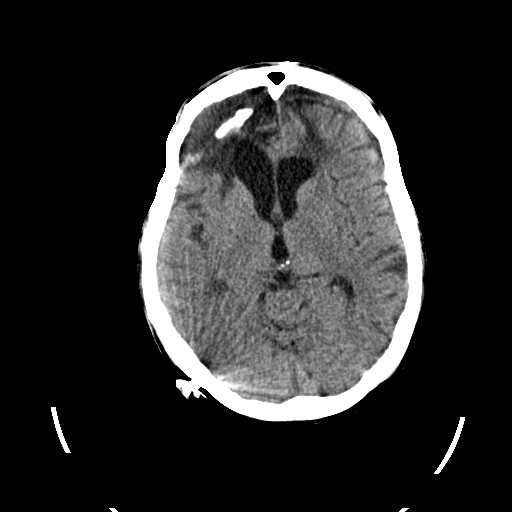
[im 15/36  brain]
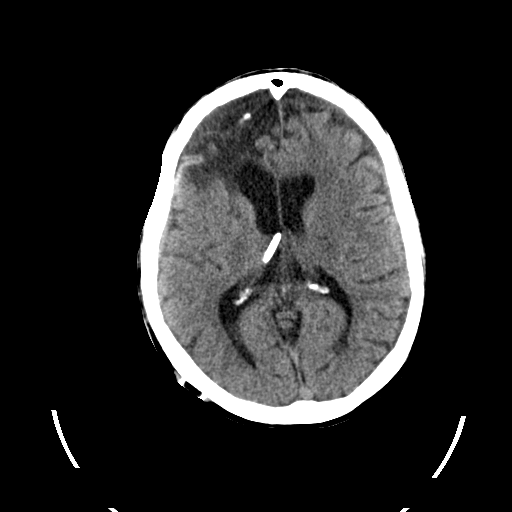
[im 17/36  brain]
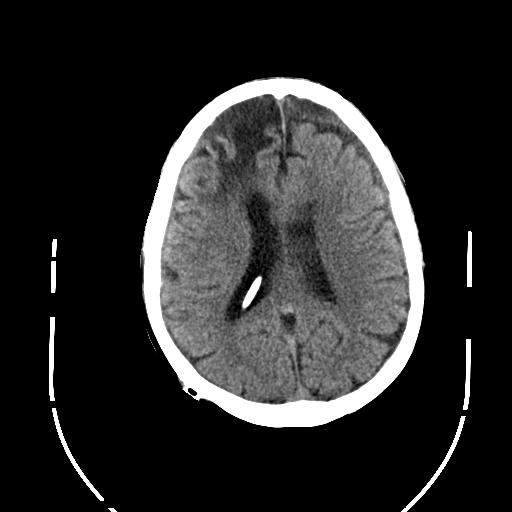
[im 19/36  brain]
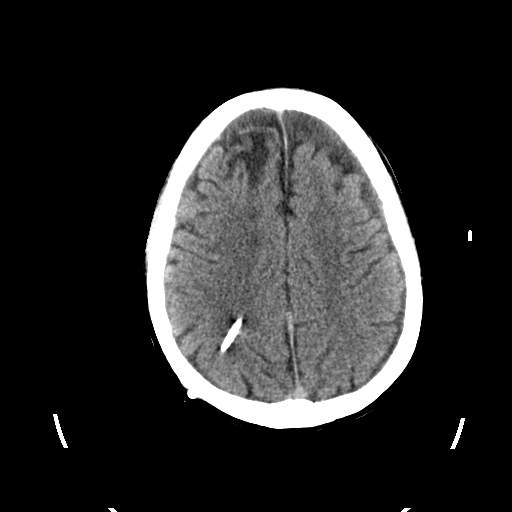
[im 19/36  bone]
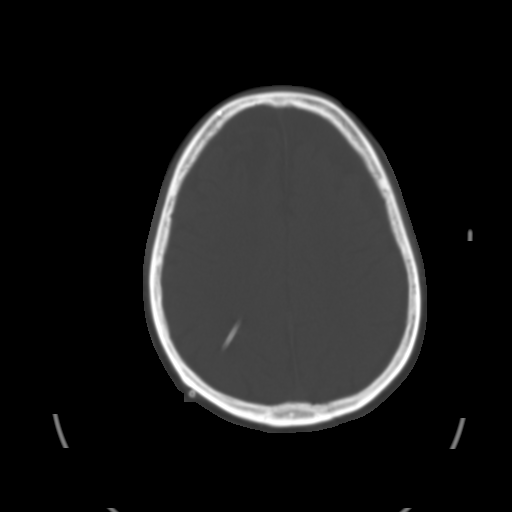
[im 21/36  brain]
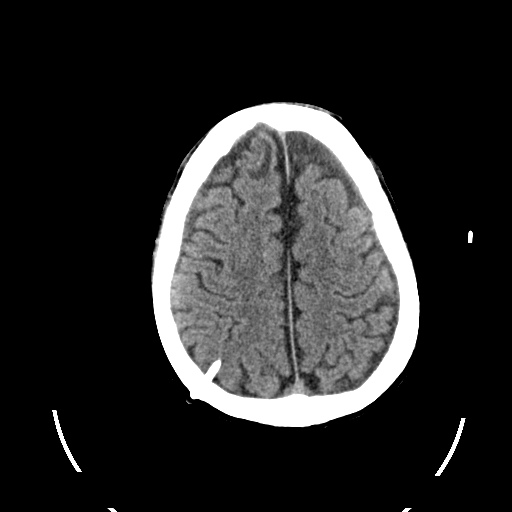
[im 23/36  brain]
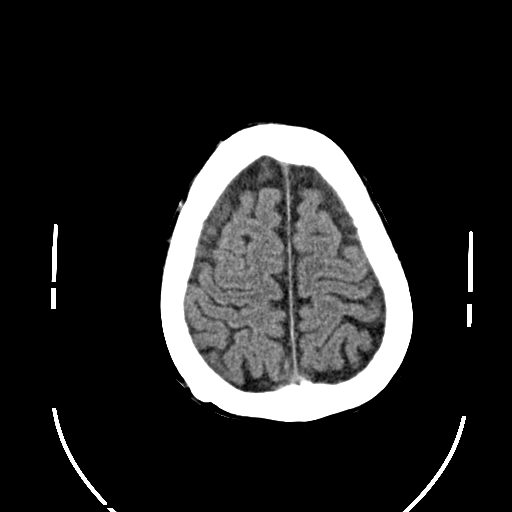
[im 26/36  brain]
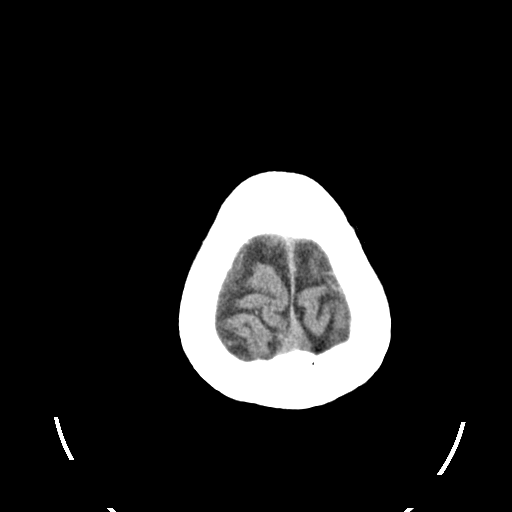
[im 27/36  brain]
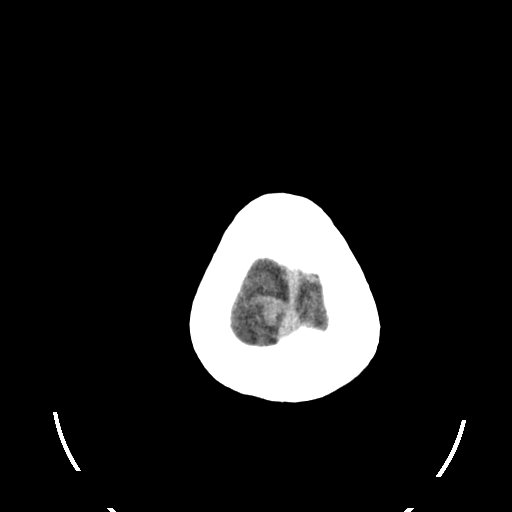
[im 27/36  bone]
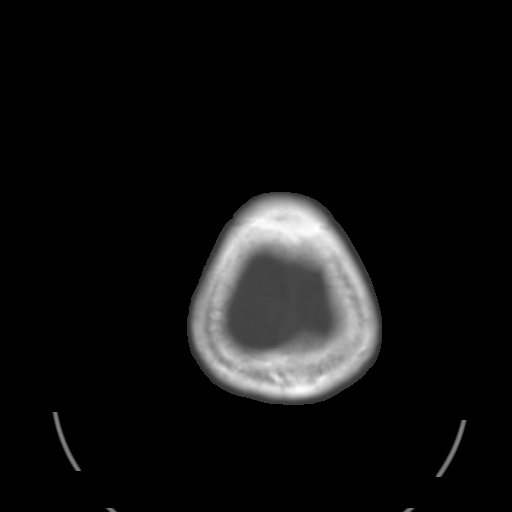
[im 29/36  brain]
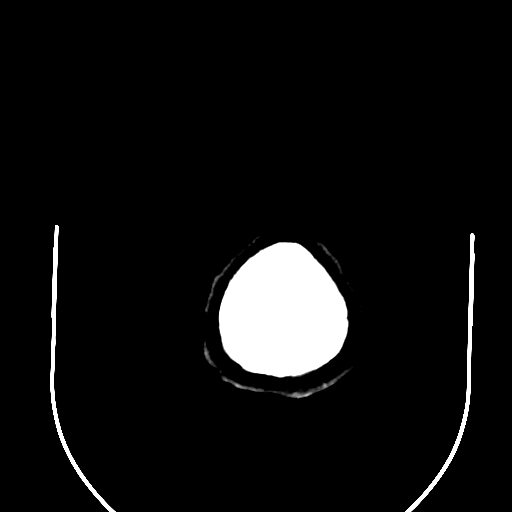
[im 32/36  brain]
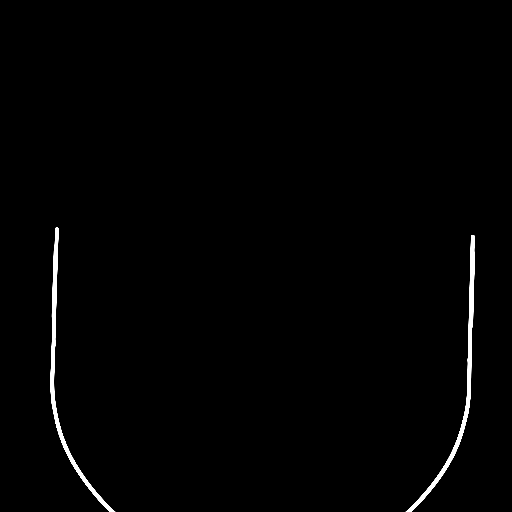
[im 34/36  brain]
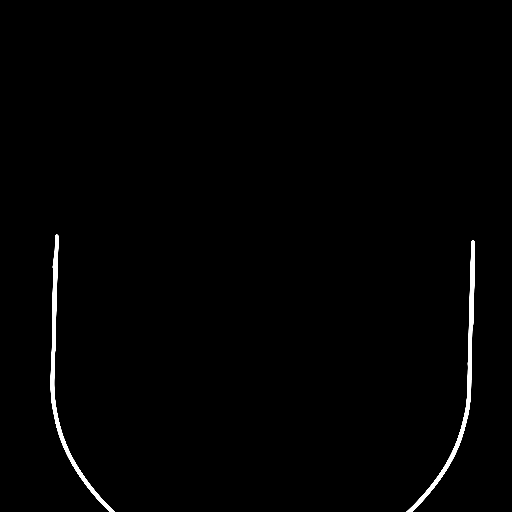

[16 of 30 positions shown; findings below may reference images not displayed]

FINDINGS: Encephalomalacia in the inferior frontal lobes
bilaterally, right greater than left.  There is dystrophic
calcification in the right frontal lobe, unchanged from the prior
study.  This is most likely due to chronic head injury.  There is
enlargement of the right frontal horn due to volume loss in the
right frontal lobe. Chronic right cerebellar infarct is unchanged.

Right parietal shunt catheter is unchanged.  No hydrocephalus.
Negative for acute hemorrhage.  Negative for acute infarct or mass.

Chronic sinusitis.
IMPRESSION: Chronic frontal lobe injury bilaterally is stable.  No acute
abnormality.

## 2014-01-20 ENCOUNTER — Emergency Department (HOSPITAL_COMMUNITY)
Admission: EM | Admit: 2014-01-20 | Discharge: 2014-01-20 | Disposition: A | Discharge status: Home / Self Care | Payer: Medicare Other | Attending: Emergency Medicine | Admitting: Emergency Medicine

## 2014-01-20 DIAGNOSIS — I10 Essential (primary) hypertension: Secondary | ICD-10-CM | POA: Insufficient documentation

## 2014-01-20 DIAGNOSIS — F17200 Nicotine dependence, unspecified, uncomplicated: Secondary | ICD-10-CM | POA: Insufficient documentation

## 2014-01-20 DIAGNOSIS — G40909 Epilepsy, unspecified, not intractable, without status epilepticus: Secondary | ICD-10-CM | POA: Insufficient documentation

## 2014-01-20 DIAGNOSIS — M25569 Pain in unspecified knee: Secondary | ICD-10-CM | POA: Insufficient documentation

## 2014-01-20 DIAGNOSIS — Z760 Encounter for issue of repeat prescription: Secondary | ICD-10-CM | POA: Insufficient documentation

## 2014-01-20 DIAGNOSIS — G8929 Other chronic pain: Secondary | ICD-10-CM | POA: Insufficient documentation

## 2014-01-20 DIAGNOSIS — Z982 Presence of cerebrospinal fluid drainage device: Secondary | ICD-10-CM | POA: Insufficient documentation

## 2014-01-20 DIAGNOSIS — Z8673 Personal history of transient ischemic attack (TIA), and cerebral infarction without residual deficits: Secondary | ICD-10-CM | POA: Insufficient documentation

## 2014-01-20 MED ORDER — FOLIC ACID 1 MG PO TABS: 1.0000 mg | ORAL_TABLET | Freq: Every day | ORAL | Status: DC

## 2014-01-20 MED ORDER — CLOPIDOGREL BISULFATE 75 MG PO TABS: 75.0000 mg | ORAL_TABLET | Freq: Every day | ORAL | Status: DC

## 2014-01-20 MED ORDER — FUROSEMIDE 20 MG PO TABS: 20.0000 mg | ORAL_TABLET | Freq: Every day | ORAL | Status: DC

## 2014-01-20 MED ORDER — DICLOFENAC SODIUM 75 MG PO TBEC: 75.0000 mg | DELAYED_RELEASE_TABLET | Freq: Two times a day (BID) | ORAL | Status: DC

## 2014-01-20 MED ORDER — DONEPEZIL HCL 10 MG PO TABS: 10.0000 mg | ORAL_TABLET | Freq: Every day | ORAL | Status: DC

## 2014-01-20 MED ORDER — ALBUTEROL SULFATE HFA 108 (90 BASE) MCG/ACT IN AERS: 2.0000 | INHALATION_SPRAY | Freq: Four times a day (QID) | RESPIRATORY_TRACT | Status: DC | PRN

## 2014-01-20 MED ORDER — PHENYTOIN SODIUM EXTENDED 100 MG PO CAPS: 300.0000 mg | ORAL_CAPSULE | Freq: Every day | ORAL | Status: DC

## 2014-01-20 MED ORDER — MEMANTINE HCL 10 MG PO TABS: 10.0000 mg | ORAL_TABLET | Freq: Two times a day (BID) | ORAL | Status: DC

## 2014-01-20 MED ORDER — LISINOPRIL 10 MG PO TABS: 10.0000 mg | ORAL_TABLET | Freq: Every day | ORAL | Status: DC

## 2014-01-20 NOTE — ED Provider Notes (Signed)
CSN: [id number]     Arrival date & time 01/20/14  1055 History  This chart was scribed for non-physician practitioner, Trixie DredgeEmily West, PA-C working with Rolland PorterMark James, MD by Greggory StallionKayla Andersen, ED scribe. This patient was seen in room TR08C/TR08C and the patient's care was started at 12:24 PM.   Chief Complaint  Patient presents with  . Knee Pain  . Medication Refill   The history is provided by the patient and a caregiver. No language interpreter was used.   HPI Comments: Miguel [name] is a 63 y.o. male with multiple medical problems presents to the Emergency Department requesting medication refills.  He is also complaining of chronic bilateral knee pain that started several years ago after his stroke. Denies injury. Bearing weight worsens the pain. The pain is unchanged from his chronic daily pain. He tried to called [facility] (his PCP) and could not afford to go to there to get refills so he is requesting refills on his daily medications (owes them money, will have to pay $200 to be seen). Pt has been out of seizure and heart medications and all medications except lisinopril for about one week. Denies recent seizures. Denies fever, leg swelling, chest pain, trouble breathing, SOB, dysuria, difficulty urinating, frequency, urgency, weakness or numbness in extremities.  Denies any change in any of his chronic symptoms from baseline.    Past Medical History  Diagnosis Date  . Hx of ventricular shunt   . Seizures   . Hypertension   . Stroke    No past surgical history on file. No family history on file. History  Substance Use Topics  . Smoking status: Current Every Day Smoker  . Smokeless tobacco: Not on file  . Alcohol Use: Yes    Review of Systems  Constitutional: Negative for fever.  Respiratory: Negative for shortness of breath.   Cardiovascular: Negative for chest pain and leg swelling.  Genitourinary: Negative for dysuria, urgency, frequency and difficulty urinating.   Musculoskeletal: Positive for arthralgias.  Neurological: Negative for seizures, weakness and numbness.  All other systems reviewed and are negative.  Allergies  Review of patient's allergies indicates no known allergies.  Home Medications   Prior to Admission medications   Medication Sig Start Date End Date Taking? Authorizing Provider  acetaminophen (TYLENOL) 325 MG tablet Take 2 tablets (650 mg total) by mouth every 6 (six) hours as needed. 03/01/13   Tora KindredMarianne L York, PA-C  albuterol (PROVENTIL HFA;VENTOLIN HFA) 108 (90 BASE) MCG/ACT inhaler Inhale 2 puffs into the lungs every 6 (six) hours as needed for wheezing.    Historical Provider, MD  clopidogrel (PLAVIX) 75 MG tablet Take 1 tablet (75 mg total) by mouth daily. 03/01/13   Shanker Levora DredgeM Ghimire, MD  diclofenac (VOLTAREN) 75 MG EC tablet Take 1 tablet (75 mg total) by mouth 2 (two) times daily. 03/01/13   Shanker Levora DredgeM Ghimire, MD  donepezil (ARICEPT) 10 MG tablet Take 1 tablet (10 mg total) by mouth at bedtime. 03/01/13   Shanker Levora DredgeM Ghimire, MD  folic acid (FOLVITE) 1 MG tablet Take 1 tablet (1 mg total) by mouth daily. 03/01/13   Tora KindredMarianne L York, PA-C  furosemide (LASIX) 20 MG tablet Take 1 tablet (20 mg total) by mouth daily. 07/12/13   Kristen N Ward, DO  HYDROcodone-acetaminophen (NORCO/VICODIN) 5-325 MG per tablet Take 1 tablet by mouth every 4 (four) hours as needed. 07/12/13   Kristen N Ward, DO  lisinopril (PRINIVIL,ZESTRIL) 10 MG tablet Take 1 tablet (10 mg  total) by mouth daily. 03/01/13   Shanker Levora DredgeM Ghimire, MD  memantine (NAMENDA) 10 MG tablet Take 1 tablet (10 mg total) by mouth 2 (two) times daily. 03/01/13   Shanker Levora DredgeM Ghimire, MD  Multiple Vitamin (MULTIVITAMIN WITH MINERALS) TABS tablet Take 1 tablet by mouth daily. 03/01/13   Stephani PoliceMarianne L York, PA-C  phenytoin (DILANTIN) 100 MG ER capsule Take 3 capsules (300 mg total) by mouth at bedtime. 03/01/13   Marianne L York, PA-C   BP 101/87  Pulse 69  Temp(Src) 97.5 F (36.4 C) (Oral)  Resp 16   Wt 148 lb (67.132 kg)  SpO2 97%  Physical Exam  Nursing note and vitals reviewed. Constitutional: He appears well-developed and well-nourished. No distress.  HENT:  Head: Normocephalic and atraumatic.  Neck: Neck supple.  Cardiovascular: Normal rate, regular rhythm and intact distal pulses.   Pulmonary/Chest: Effort normal and breath sounds normal. No respiratory distress. He has no wheezes. He has no rales.  Abdominal: Soft. He exhibits no distension and no mass. There is no tenderness. There is no rebound and no guarding.  Musculoskeletal:  Upper and lower extremities:  Strength 5/5, sensation intact, distal pulses intact.   Bilateral knees are without edema, erythema or warmth. Dorsalis pedis and posterior tibialis pulses intact bilaterally.  Neurological: He is alert. No cranial nerve deficit. He exhibits normal muscle tone.  Skin: He is not diaphoretic.    ED Course  Procedures (including critical care time)  DIAGNOSTIC STUDIES: Oxygen Saturation is 97% on RA, normal by my interpretation.    COORDINATION OF CARE: 12:34 PM-Discussed treatment plan which includes short term refills of medications and speaking with case management about PCP with pt at bedside and pt agreed to plan.   Labs Review Labs Reviewed - No data to display  Imaging Review No results found.   EKG Interpretation None      MDM   Final diagnoses:  Medication refill    Pt with multiple medical problems out of the majority of his home medications x 1 week.  Denies any worsening or change in his chronic symptoms.  Does note bilateral knee pain that is also unchanged from his baseline.  Owes his PCP money, will have money July 3 and will be able to follow up then.  I have written prescriptions for 2 weeks of his home medications and urged close follow up. Exam unremarkable.  Discussed findings, treatment, and follow up  with patient.  Pt given return precautions.  Pt verbalizes understanding and agrees  with plan.      I personally performed the services described in this documentation, which was scribed in my presence. The recorded information has been reviewed and is accurate.  Trixie Dredgemily West, PA-C 01/20/14 1356

## 2014-01-20 NOTE — ED Notes (Signed)
Pt arrives ambulatory with cane. Reports pain to anterior left knee. Denies recent injury. Pt also requesting refills on daily medications. Pt reports unable to contact PCP. Pt awake, alert, oriented x4, NAD at present.

## 2014-01-20 NOTE — ED Notes (Signed)
Pt escorted out via WC by NT to ED entrance

## 2014-01-20 NOTE — ED Notes (Addendum)
Patient states that he is unable to see MD because of account balance. Pt states taht he has siezures  And is out of all his medication. Pt also complains of pain and states that he takes percoset for pain. Pts significant other states that he is also very unstable on his feet. Denies recent injury.

## 2014-01-20 NOTE — Discharge Instructions (Signed)
Read the information below.  Use the prescribed medication as directed.  Please discuss all new medications with your pharmacist.  You may return to the Emergency Department at any time for worsening condition or any new symptoms that concern you.     Medication Refill, Emergency Department We have refilled your medication today as a courtesy to you. It is best for your medical care, however, to take care of getting refills done through your primary caregiver's office. They have your records and can do a better job of follow-up than we can in the emergency department. On maintenance medications, we often only prescribe enough medications to get you by until you are able to see your regular caregiver. This is a more expensive way to refill medications. In the future, please plan for refills so that you will not have to use the emergency department for this. Thank you for your help. Your help allows [place] to better take care of the daily emergencies that enter our department. Document Released: 11/01/2003 Document Revised: 10/07/2011 Document Reviewed: 07/15/2005 [facility] Patient Information 2015 [place], [place]. This information is not intended to replace advice given to you by your health care provider. Make sure you discuss any questions you have with your health care provider.    Emergency Department Resource Guide 1) Find a Doctor and Pay Out of Pocket Although you won't have to find out who is covered by your insurance plan, it is a good idea to ask around and get recommendations. You will then need to call the office and see if the doctor you have chosen will accept you as a new patient and what types of options they offer for patients who are self-pay. Some doctors offer discounts or will set up payment plans for their patients who do not have insurance, but you will need to ask so you aren't surprised when you get to your appointment.  2) Contact Your Local Health Department Not all health  departments have doctors that can see patients for sick visits, but many do, so it is worth a call to see if yours does. If you don't know where your local health department is, you can check in your phone book. The CDC also has a tool to help you locate your state's health department, and many state websites also have listings of all of their local health departments.  3) Find a Walk-in Clinic If your illness is not likely to be very severe or complicated, you may want to try a walk in clinic. These are popping up all over the country in pharmacies, drugstores, and shopping centers. They're usually staffed by nurse practitioners or physician assistants that have been trained to treat common illnesses and complaints. They're usually fairly quick and inexpensive. However, if you have serious medical issues or chronic medical problems, these are probably not your best option.  No Primary Care Doctor: - Call Health Connect at  [phone] - they can help you locate a primary care doctor that  accepts your insurance, provides certain services, etc. - Physician Referral Service- [phone]  Chronic Pain Problems: Organization         Address  Phone   Notes  [facility]  [phone] Patients need to be referred by their primary care doctor.   Medication Assistance: Organization         Address  Phone   Notes  [name] Medication [facility] [address] [place] [place], [place] [address] [phone] --Must  be a resident of [place] -- Must have NO insurance coverage whatsoever (no Medicaid/ Medicare, etc.) -- The pt. MUST have a primary care doctor that directs their care regularly and follows them in the community   [facility]ist  [phone]   [employer]  [phone]) [phone]    Agencies that provide inexpensive medical care: Organization         Address  Phone   Notes  [facility]  [phone]   [facility]     [phone]   [facility] [address] [place], [place] [address] [phone]) [phone]   [facility] [address], [place] [phone](336) [phone]   [facility]anned [facility]    [phone]) [phone]   [facility]    [phone]) [phone]   [facility]  [address], [place] Phone:  [phone], Fax:  [phone](336) [phone] Hours of Operation:  9 am - 6 pm, M-F.  Also accepts Medicaid/Medicare and self-pay.  [facility]  [address], [place] Phone: [phone](336) [phone], Fax: [phone]. Hours of Operation:  8:30 am - 5:30 pm, M-F.  Also accepts Medicaid and self-pay.  [facility] [place] [address], [place] Phone: [phone](336) [phone]   [facility] [address], [place], [place] [phone], Ext. [phone] Mondays & Thursdays: 7-9 AM.  First 15 patients are seen on a first come, first serve basis.    Medicaid-accepting [place] [place] Providers:  Organization         Address  Phone   Notes  [facility] [address], [place] [phone]) [phone] Also accepts self-pay patients.  [facility] [address], [place]  [phone]) [phone]   [facility] [address], [place] [phone]) [phone]   [facility] [address] [place] [address], [place] [phone]) [phone]   Renaye RakersVeita Bland [address], [place]   [phone] Only accepts [place] Access [place] patients after they have their name applied to their card.   Self-Pay (no insurance) in [facility] [place]:  Organization         Address  Phone   Notes  Sickle Cell Patients, [facility] [address], [place] [phone](336) [phone]   [facility] [address], [place] [phone]) [phone]   [facility] [place]  [address], [place] [phone]) [phone]     [facility]/Dr. Osei-Bonsu  [address] [place] [address], [place] or [phone], [place] [phone]) [phone] Phone number for both [place] and [place] locations is the same.  [facility] [address], [place] [phone]) [phone]   [facility] [place] [address] [place] [address], [place] or [address] [phone]   [facility] [address], [place] [phone](336) [phone], phone; [phone](336) [phone], fax Sees patients 1st and 3rd Saturday of every month.  Must not qualify for public or private insurance (i.e. Medicaid, Medicare, [place] Health Choice, Veterans' Benefits)  Household income should be no more than 200% of the poverty level The clinic cannot treat you if you are pregnant or think you are pregnant  Sexually transmitted diseases are not treated at the clinic.    Dental Care: Organization  Address  Phone  Notes  °[place] [facility] [address], [place] [phone] Accepts children up to age 21 who are enrolled in Medicaid or [place] Health Choice; pregnant women with a Medicaid card; and children who have applied for Medicaid or [place] Health Choice, but were declined, whose parents can pay a reduced fee at time of service.  °[place] [facility] [place]  [address], [place] [phone] Accepts children up to age 21 who are enrolled in Medicaid or [place] Health Choice; pregnant women with a Medicaid card; and children who have applied for Medicaid or [place] Health Choice, but were declined, whose parents can pay a reduced fee at time of service.  °[facility] ° [address], [place] [phone] Patients are seen by appointment only. Walk-ins are not accepted. [facility] will see patients 18 years of age and older. °Monday - Tuesday (8am-5pm) °Most Wednesdays (8:30-5pm) °$30 per visit,  cash only  °[facility] ° [address], [place] [phone] Patients are seen by appointment only. Walk-ins are not accepted. [facility] will see patients 18 years of age and older. °One Wednesday Evening (Monthly: Volunteer Based).  $30 per visit, cash only  °[facility]  [phone] for adults; Children under age 4, call Graduate Pediatric Dentistry at [phone]. Children aged 4-14, please call [phone] to request a pediatric application. ° Dental services are provided in all areas of dental care including fillings, crowns and bridges, complete and partial dentures, implants, gum treatment, root canals, and extractions. Preventive care is also provided. Treatment is provided to both adults and children. °Patients are selected via a lottery and there is often a waiting list. °  °[facility] [address], °[place] ° [phone] [email or web address] °  °[facility] [address], [place], [place] [phone], Ext. [phone] Second and Fourth Thursday of each month, opens at 6:30 AM; Clinic ends at 9 AM.  Patients are seen on a first-come first-served basis, and a limited number are seen during each clinic.  ° °[facility] ° [address], [place], [place] [phone]   Eligibility Requirements °You must have lived in [place], [place], or [place] counties for at least the last three months. °  You cannot be eligible for state or federal sponsored healthcare insurance, including Veterans Administration, Medicaid, or Medicare. °  You generally cannot be eligible for healthcare insurance through your employer.  °  How to apply: °Eligibility screenings are held every Tuesday and Wednesday afternoon from 1:00 pm until 4:00 pm. You do not need an appointment for the interview!  °[facility] [address], [place], [place] [phone]   °[place] [facility]   [phone]   °[place] [facility]  [phone]   °[place]  [phone]   ° °Behavioral Health Resources in the Community: °Intensive Outpatient Programs °Organization         Address  Phone  Notes  °[place] [facility] [address], [place], [place] [phone]   °[facility] [address], [place], [place] [phone]   °[facility] [address], [place], [place] ° [phone]   °[place] [address],  °, [place] 1-800-853-5163 or [phone]   °Substance Abuse Resources °  Organization         Address  Phone  Notes  [facility]  [phone]   [facility]  [phone]   [facility]  [phone]   [facility]  [phone]   Residential & Outpatient Substance Abuse Program  [phone]   Psychological Services Organization         Address  Phone  Notes  [facility]  [phone]- [phone]   [facility]  [phone]- [phone]   [facility] [address], [place] [phone] or [phone]    Mobile Crisis Teams Organization         Address  Phone  Notes  [facility], Mobile Crisis Care Unit  [phone]   [facility]  [address] [place], [place] [phone]   Doristine LocksSharon DeEsch [address] [place] [phone]    Self-Help/Support Groups Organization         Address  Phone             Notes  Mental Health Assoc. of  - variety of support groups  336- [phone] Call for more information  Narcotics Anonymous (NA), [facility] [address], [place]  2 meetings at this location   [facility]idential Treatment Programs Organization         Address  Phone  Notes  [facility] [address],    [place]  [phone]   [facility]  [address], [place] [phone], [place], [place] [phone]    [facility] [address], [place] [phone] Admissions: 8am-3pm M-F  [facility] [address]    [place], [place] [phone]   [facility] [address], [place], [place] [phone]   [facility] [address]  [place], [place] [phone]   [facility] - Intensive Outpatient [address], [place], [place] [phone]   [facility] ([facility] [address]  [place], [place] [phone] or [phone]   [facility] ([facility]) [address] [place], [place] [phone] Accepts Medicaid  [facility] [address]  [place] [phone] Substance Abuse/Addiction Treatment   [facility] Resources Organization         Address  Phone  Notes  [facility]  [phone]) [phone]   Angie FavaJulie Brannon, PhD [address], [address] [place], [place]   [phone]) [phone] or [phone]   [facility]   [address] [place], [place] [phone](336) [phone]   [facility] [address], [place], [place] [phone]) [phone] Insurance/Medicaid/sponsorship through [facility]  [facility] [address]                                    [place], [place] [phone](336) [phone] Therapy/tele-psych/case  [facility] [address]   [place], [place] [phone](336) [phone]    Dr. Lolly MustacheArfeen  [phone](336) [phone]   [facility] [place]  [employer] [facility] 1) [address], [place] 2) [address], [place] 3)  [address], [place] [phone](336) [phone]) [phone]  [phone](336) [phone]   [facility] Child Abuse Hotline [phone](336) [phone] or [phone](336) [phone] (After Hours)

## 2014-01-24 NOTE — ED Provider Notes (Signed)
Medical screening examination/treatment/procedure(s) were performed by non-physician practitioner and as supervising physician I was immediately available for consultation/collaboration.   EKG Interpretation None        Rolland PorterMark James, MD 01/24/14 2141

## 2014-02-08 IMAGING — CR DG CHEST 1V PORT
1 series · 1 of 1 positions shown · non-contrast
Comparison: 10/14/2009

CLINICAL DATA: Altered mental status.  Pain.  Recent fall.

PORTABLE CHEST - 1 VIEW

[AP]
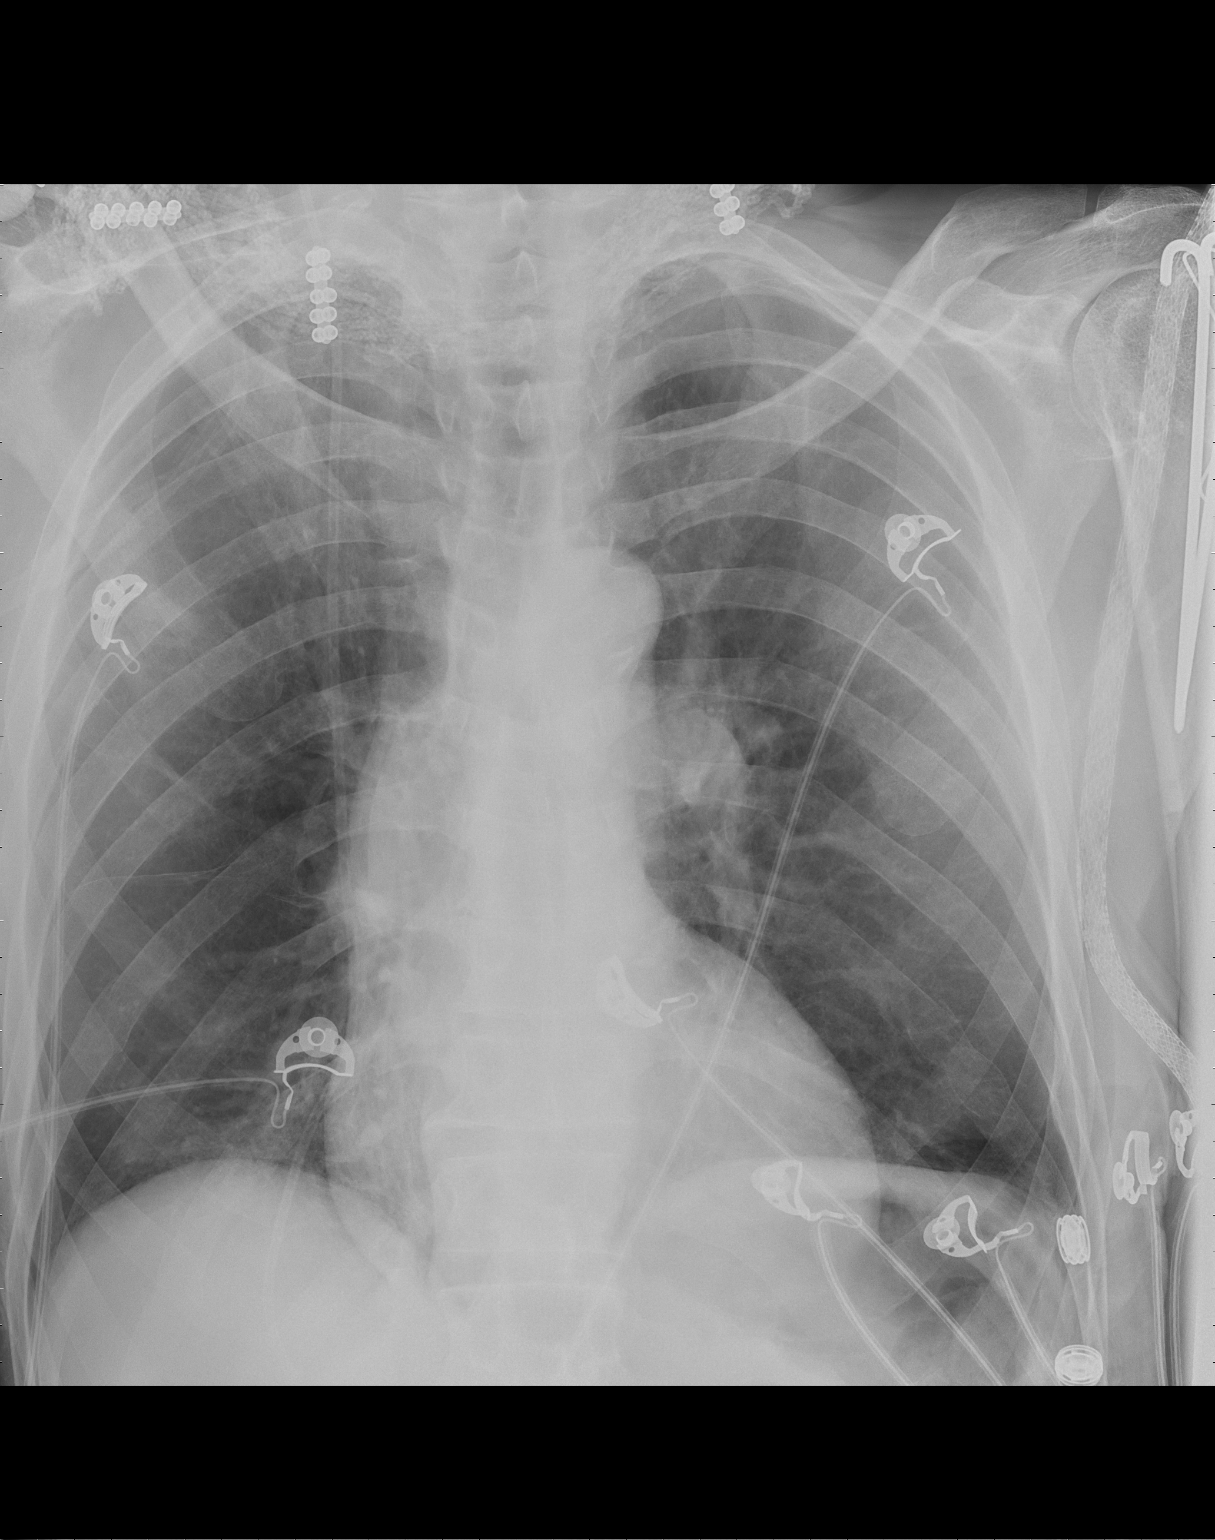

[1 of 1 positions shown; findings below may reference images not displayed]

FINDINGS: Heart size and pulmonary vascularity are normal.  Slight
atelectasis at the right lung base medially.  Lungs are otherwise
clear.  No acute osseous abnormality.
IMPRESSION: Focal slight atelectasis at the right lung base.

## 2014-02-08 IMAGING — CT CT HEAD W/O CM
4 of 6 series · 17 of 47 positions shown, 19 images · non-contrast
Comparison: 10/13/2012

CLINICAL DATA: Altered mental status.  Slurred speech.  Recent
fall.  Urinary incontinence.

CT HEAD WITHOUT CONTRAST
TECHNIQUE: Contiguous axial images were obtained from the base of
the skull through the vertex without contrast.

[Series 3: recon 2: brain · axial · 0.47mm/px · z∈[-127,-44]mm · 5 of 64 slices shown]
[im 8/64  brain]
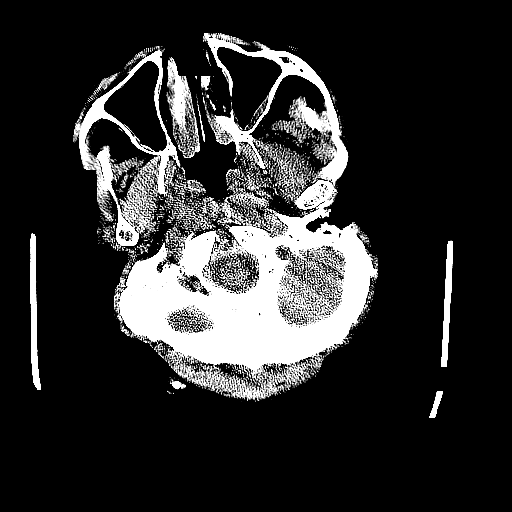
[im 16/64  brain]
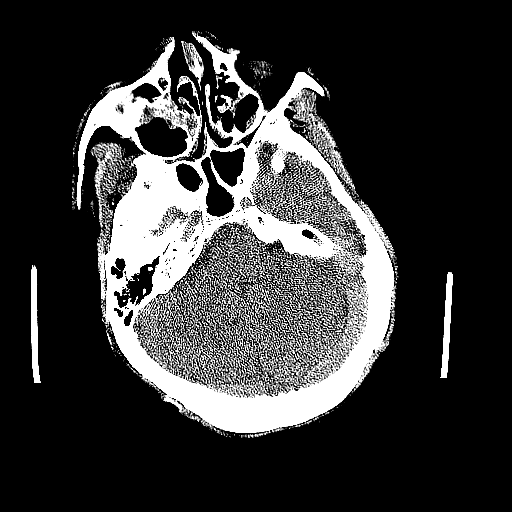
[im 24/64  brain]
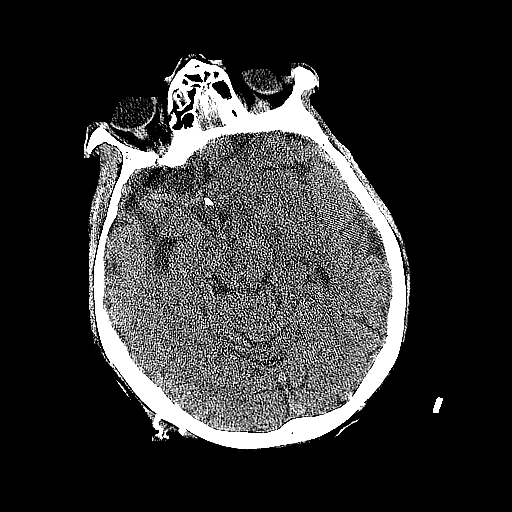
[im 32/64  brain]
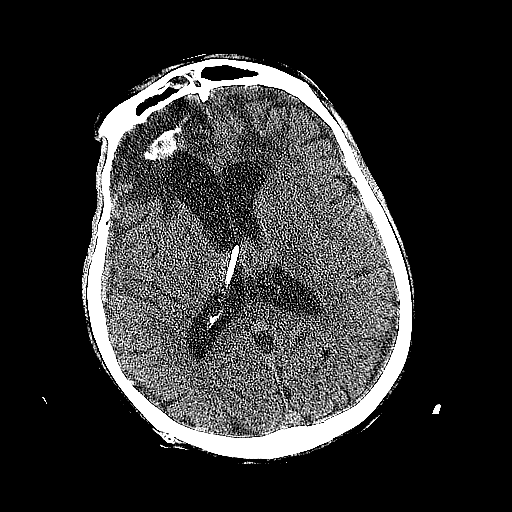
[im 40/64  brain]
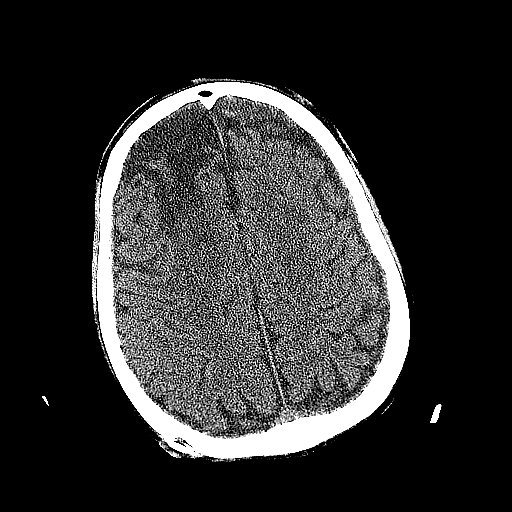

[Series 600: sagittals · sagittal · 0.39mm/px · 3 of 29 slices shown]
[im 10/29  brain]
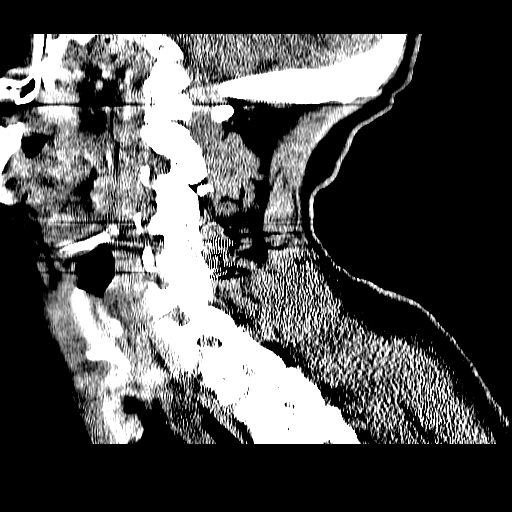
[im 15/29  brain]
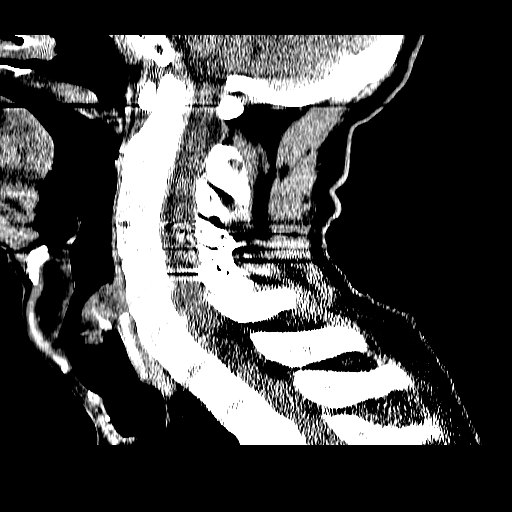
[im 19/29  brain]
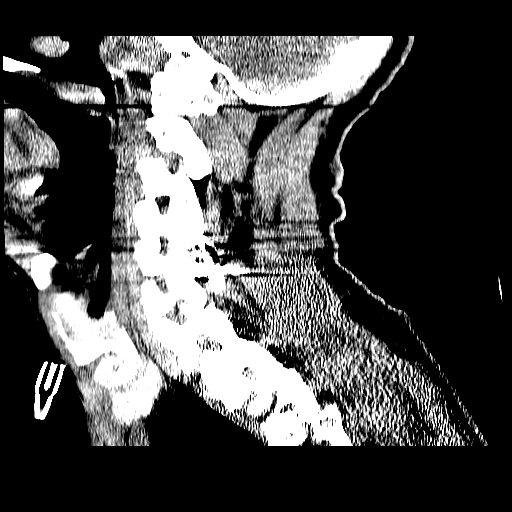

[Series 601: orthos · axial · 0.33mm/px · z∈[-301,-189]mm · 6 of 55 slices shown, 8 images]
[im 8/55  brain]
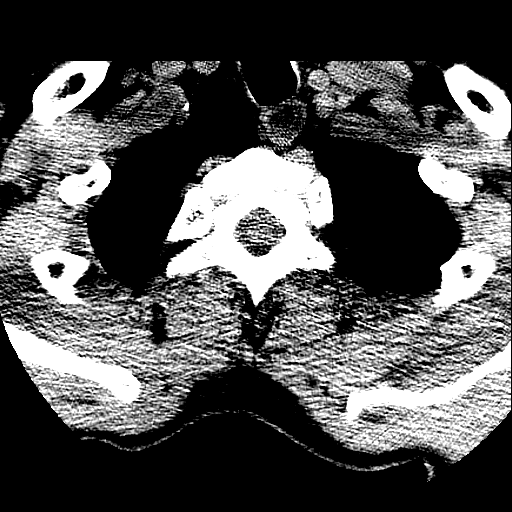
[im 8/55  bone]
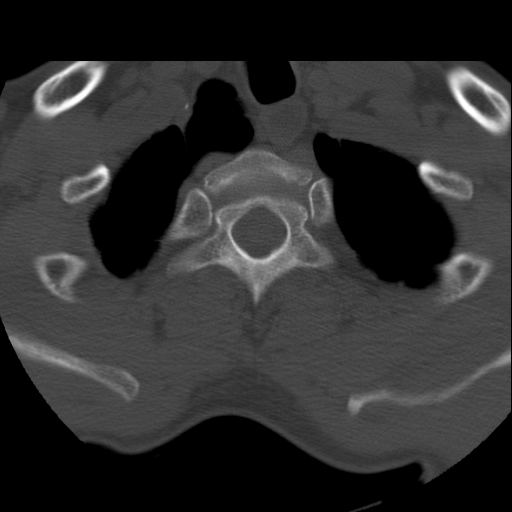
[im 16/55  brain]
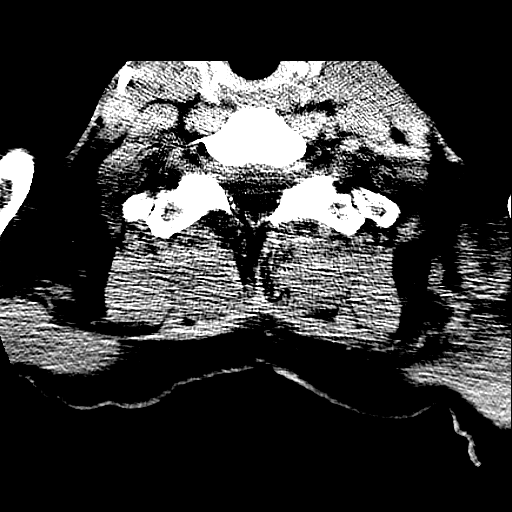
[im 24/55  brain]
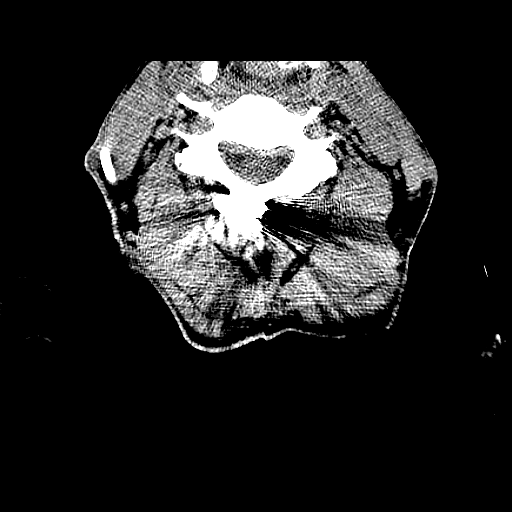
[im 31/55  brain]
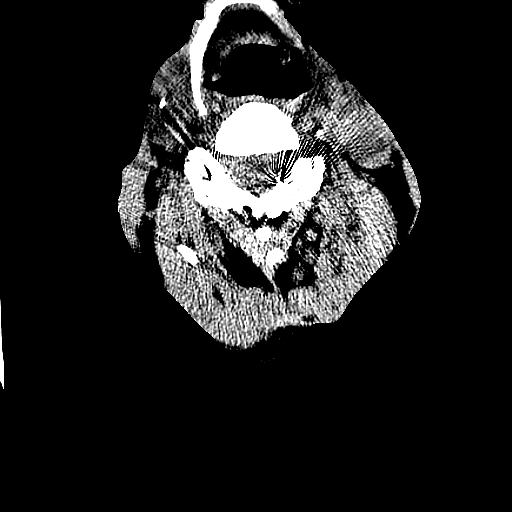
[im 39/55  brain]
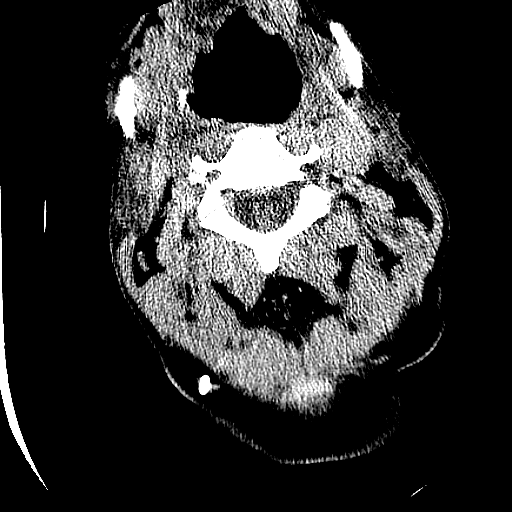
[im 39/55  bone]
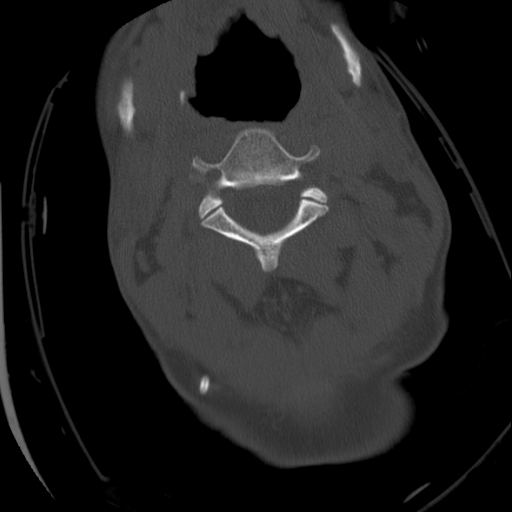
[im 47/55  brain]
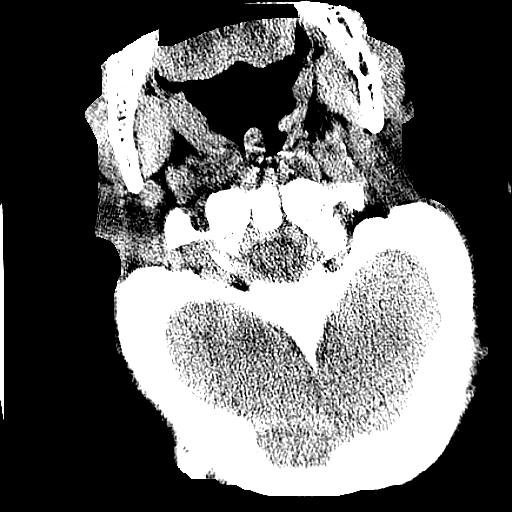

[Series 602: coronals · coronal · 0.39mm/px · 3 of 43 slices shown]
[im 15/43  brain]
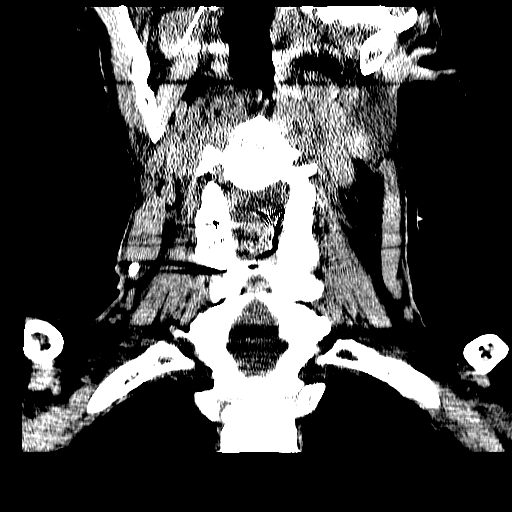
[im 19/43  brain]
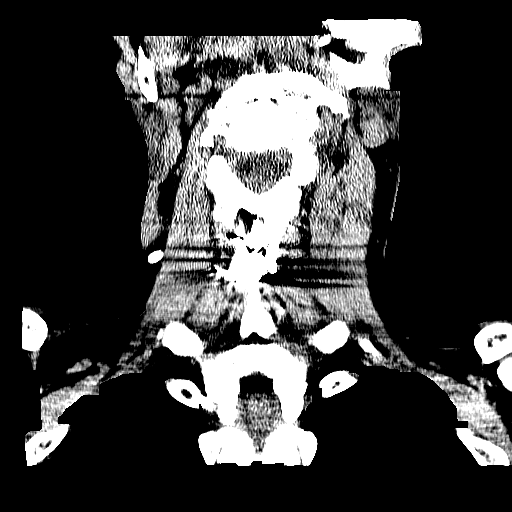
[im 24/43  brain]
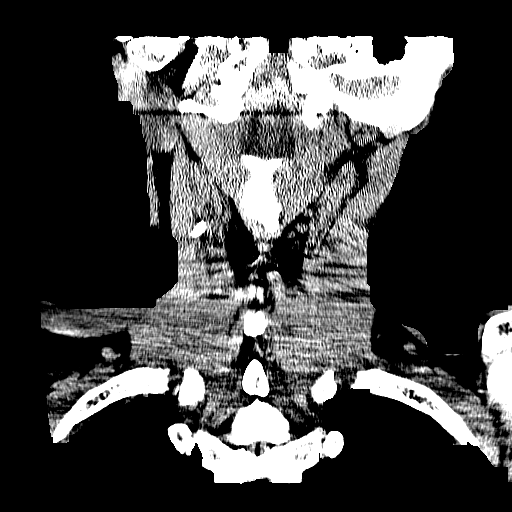

[17 of 47 positions shown; findings below may reference images not displayed]

FINDINGS: There is no acute intracranial hemorrhage, infarction, or
mass lesion.  Interventricular shunt is in place, unchanged.  No
change in the size of the ventricles.

Chronic encephalomalacia of the frontal lobes, more prominent on
the right than the left.  Dystrophic calcification on the right is
stable.

No acute osseous abnormality.  Slight mucosal thickening in the
ethmoid and maxillary sinuses.
IMPRESSION: No acute intracranial abnormality.  No significant change since the
prior exam.

## 2014-03-13 IMAGING — CR DG CHEST 1V
1 series · 1 of 1 positions shown · non-contrast
Comparison: 01/22/2013.

CLINICAL DATA: Altered mental status.  Shunt series.  Hypotension.
Alcohol intoxication.

CHEST - 1 VIEW

[t chest supine]
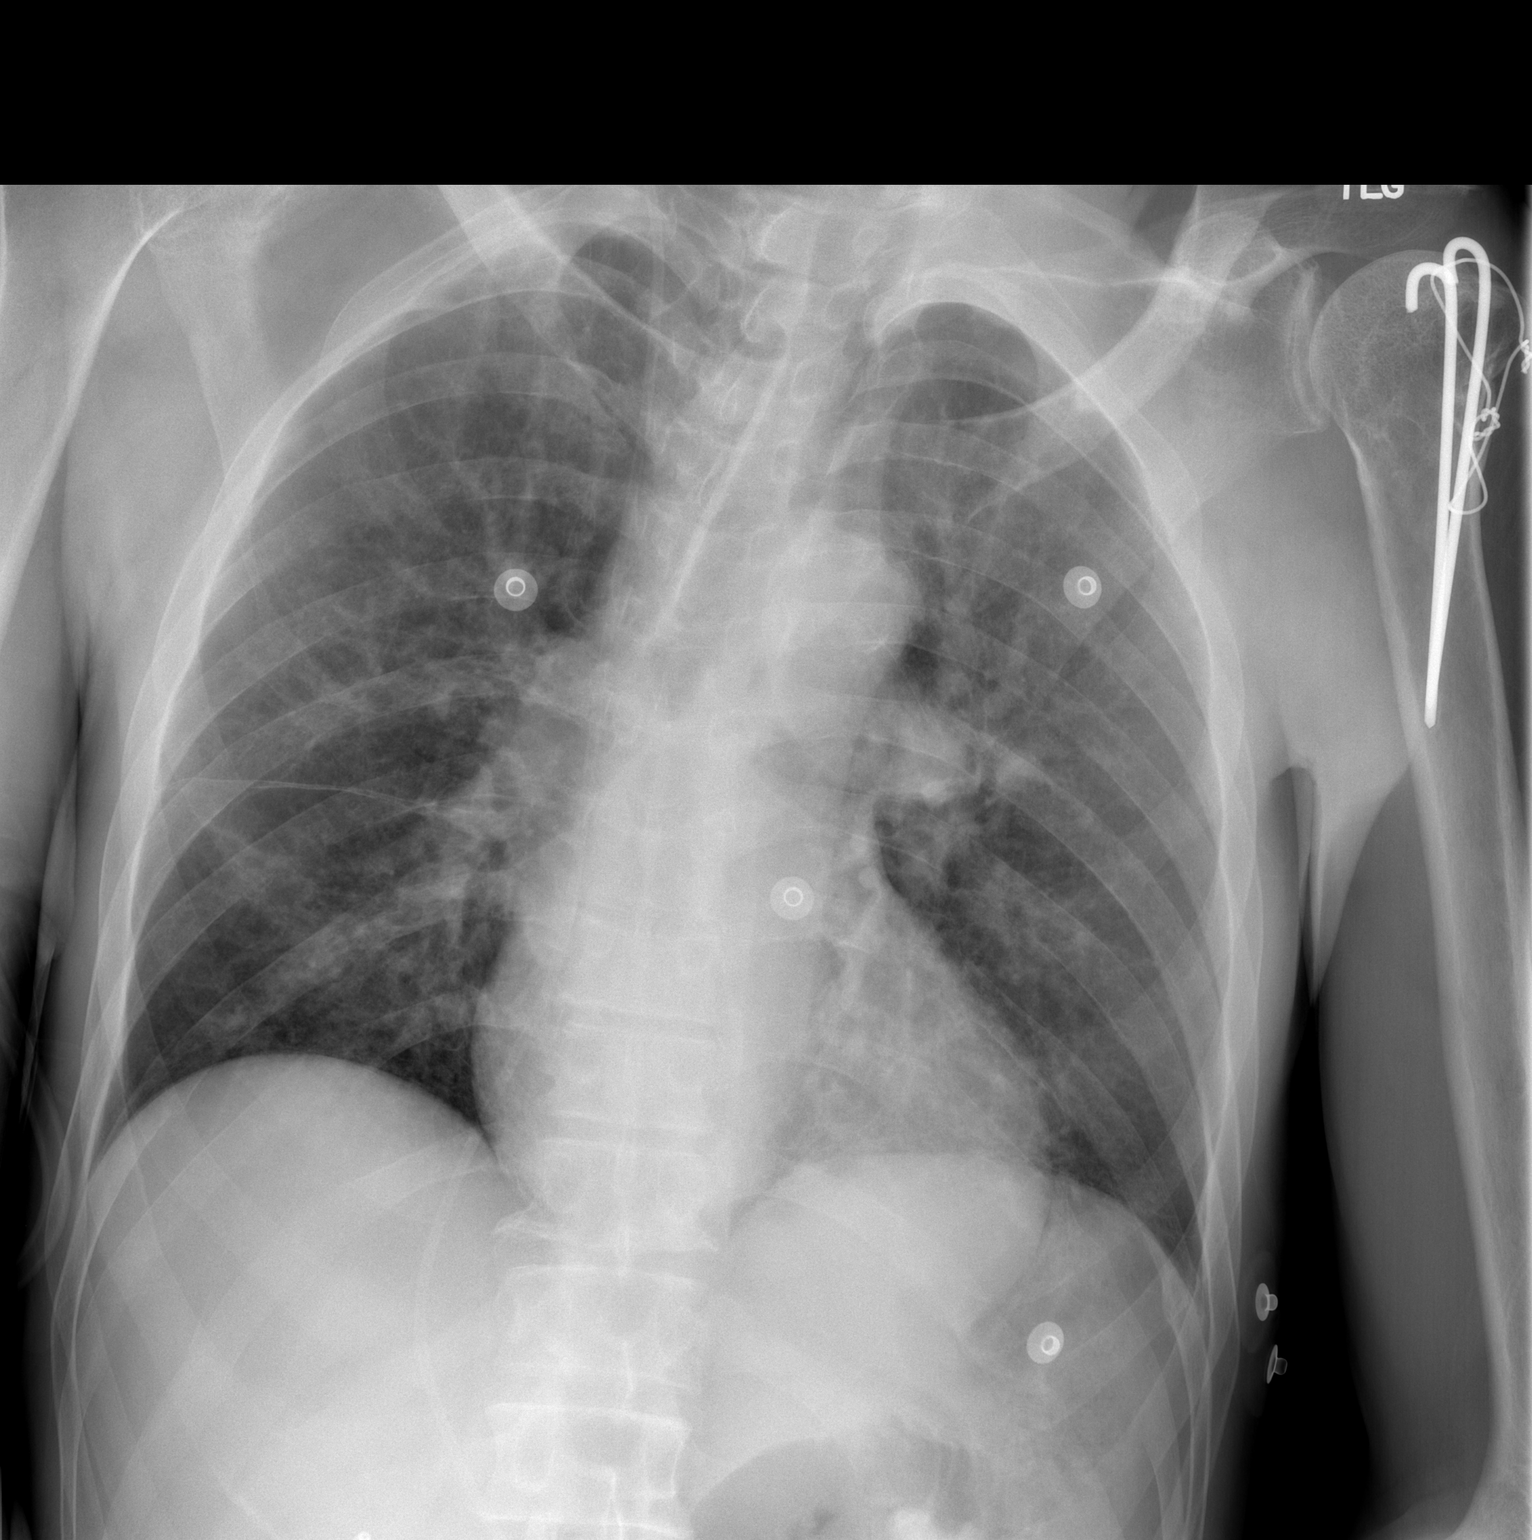

[1 of 1 positions shown; findings below may reference images not displayed]

FINDINGS: Shunt tubing projects over the right chest.  This appears
contiguous.  Lung volumes are lower than on prior exam.  Basilar
opacity is present which could represent aspiration pneumonitis in
the setting of intoxication.  This appears more compatible with
airspace disease than atelectasis.  Cardiopericardial silhouette
appears similar.
IMPRESSION: 1.  Shunt tubing appears contiguous across the chest.
2.  Medial basilar airspace opacity suggestive of aspiration
pneumonitis.  There is probably some superimposed atelectasis with
lower lung volumes than on prior.

## 2014-03-13 IMAGING — CR DG ABDOMEN 1V
1 series · 1 of 1 positions shown · non-contrast
Comparison: 10/14/2009.

CLINICAL DATA: Hypotension.  Alcohol intoxication.

ABDOMEN - 1 VIEW

[t abdomen supine]
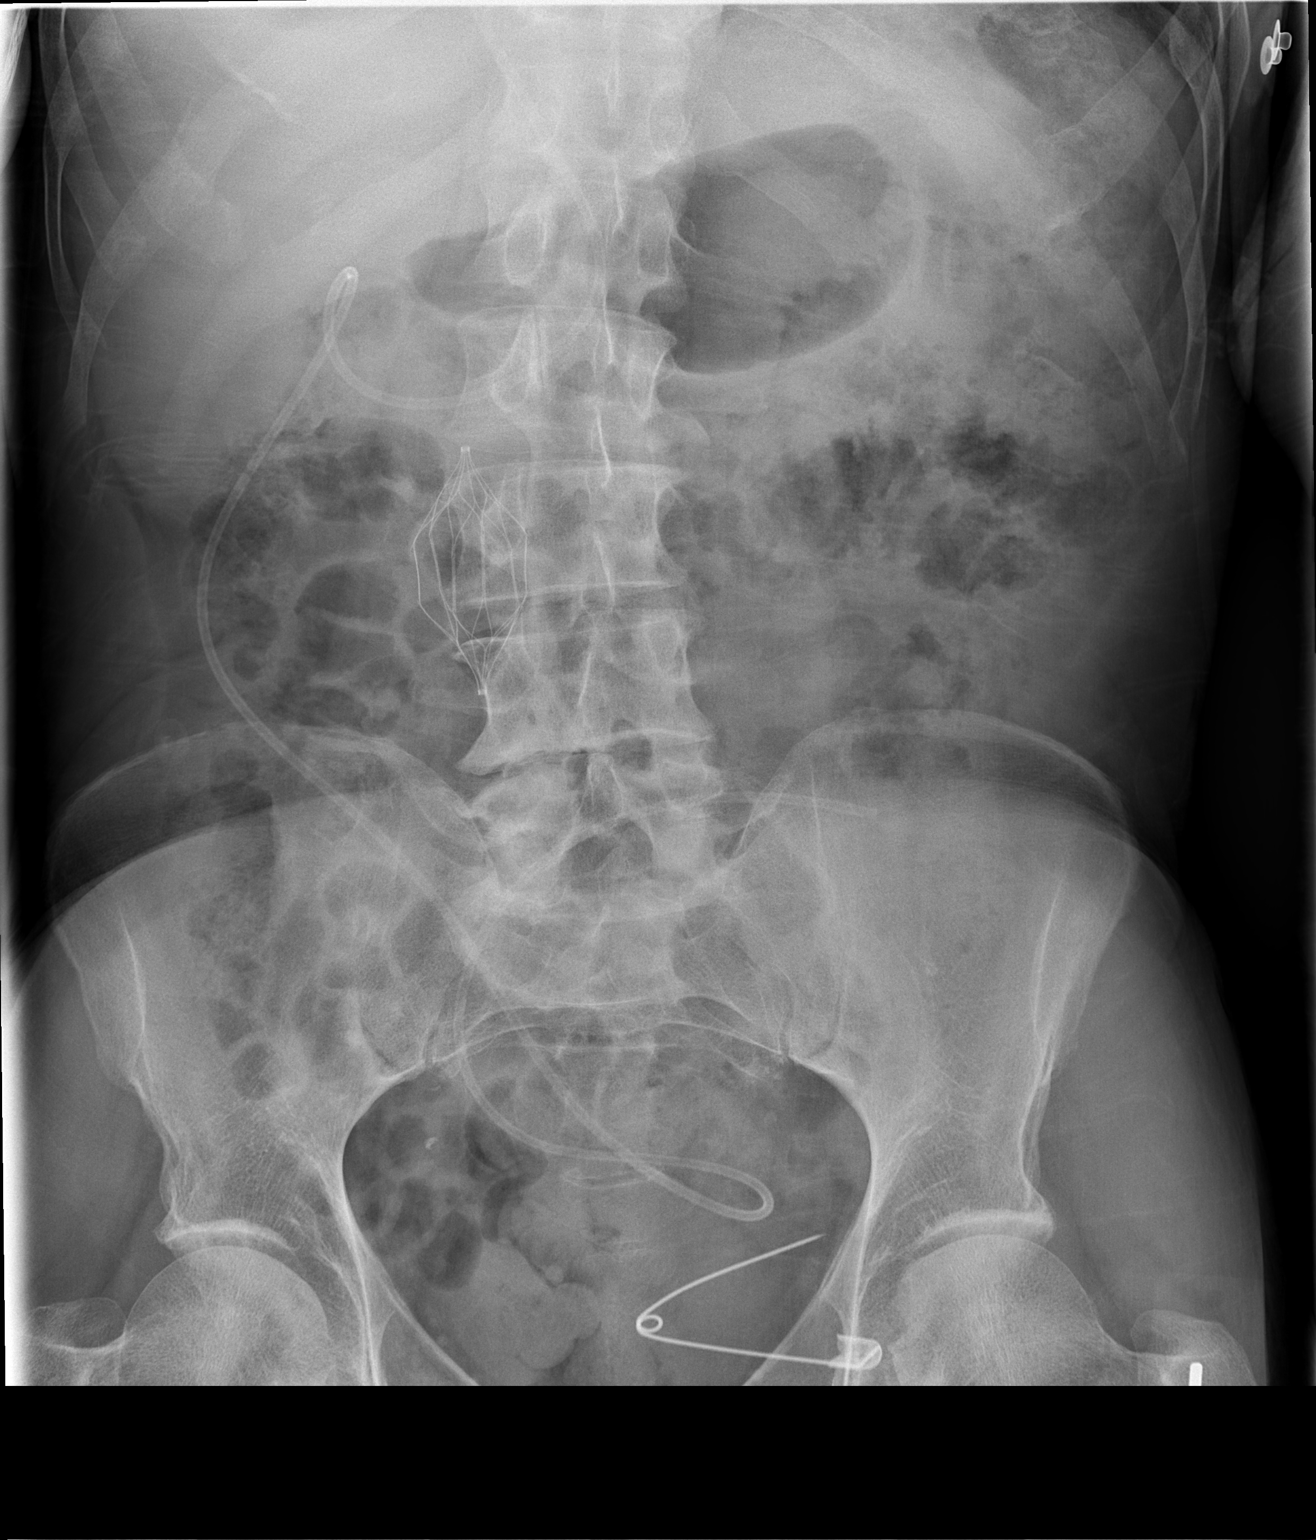

[1 of 1 positions shown; findings below may reference images not displayed]

FINDINGS: The shunt tubing appears contiguous and terminates in the
anatomic pelvis.  There is a safety pin projected over the left
anatomic pelvis, presumably external to the patient.  Recommend
correlation with inspection.  IVC filter appears similar.  Bowel
gas pattern is within normal limits.

No mass lesions or displacement of bowel loops.
IMPRESSION: Shunt tubing appears within normal limits.  Safety pin projects
over the left pelvis, presumably external to the patient.

## 2014-03-13 IMAGING — CR DG SKULL 1-3V
1 series · 1 of 1 positions shown · non-contrast
Comparison: None.

CLINICAL DATA: Altered mental status.  Shunt series.

SKULL - 1-3 VIEW

[t skull lat]
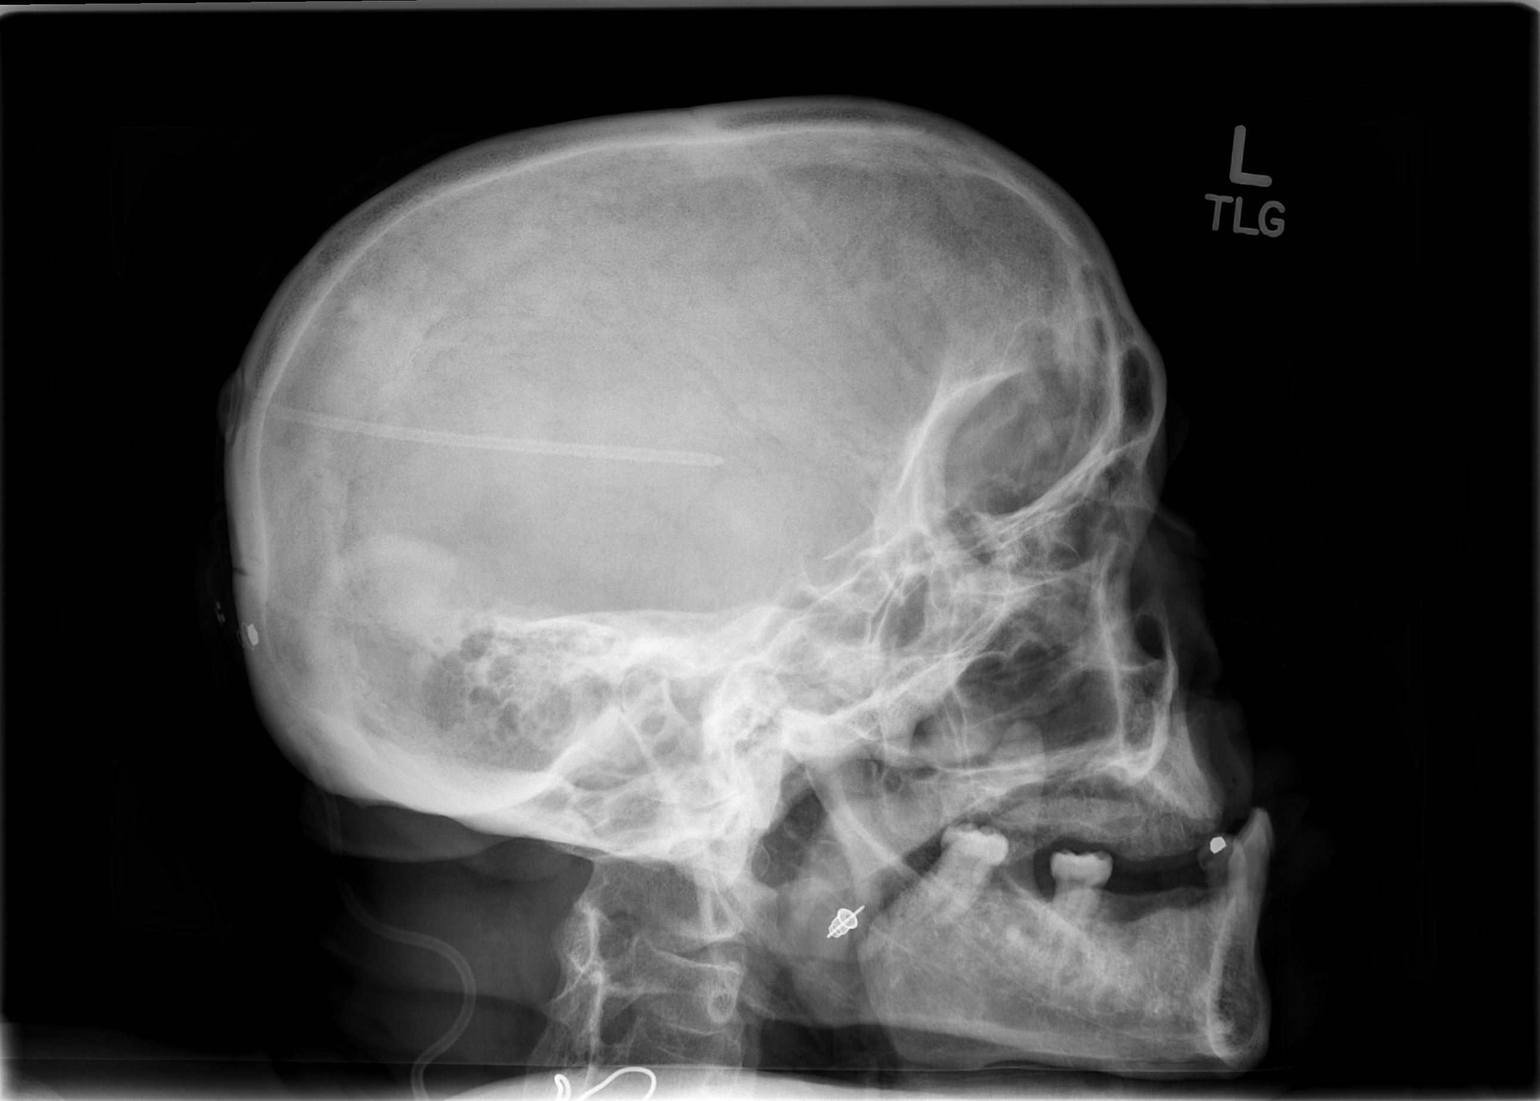

[1 of 1 positions shown; findings below may reference images not displayed]

FINDINGS: Right parietal ventriculostomy is present.  There is no
discontinuity of the shunt tubing identified.  Partially visualized
wire projects over the cervical spine, presumably for fixation.
IMPRESSION: Left parietal ventriculostomy.  No shunt tubing discontinuity
identified.

## 2014-03-13 IMAGING — CT CT HEAD W/O CM
2 series · 16 of 30 positions shown, 18 images · non-contrast
Comparison: 01/22/2013

CLINICAL DATA: Altered mental status and found unresponsive.

CT HEAD WITHOUT CONTRAST
TECHNIQUE: Contiguous axial images were obtained from the base of
the skull through the vertex without contrast.

[Series 2: head w/o · axial · non-contrast · 0.49mm/px · z∈[+105,+225]mm · 8 of 32 slices shown, 10 images]
[im 4/32  brain]
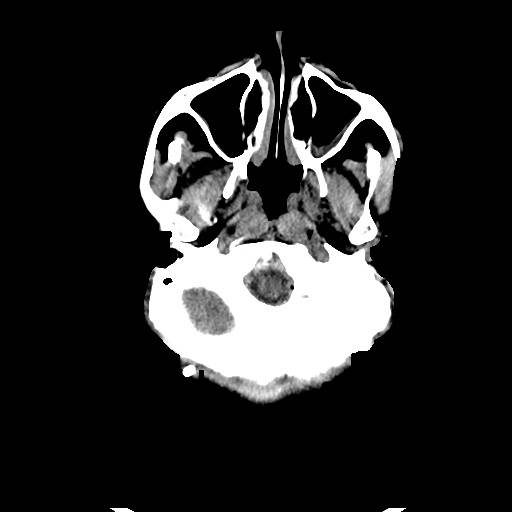
[im 4/32  bone]
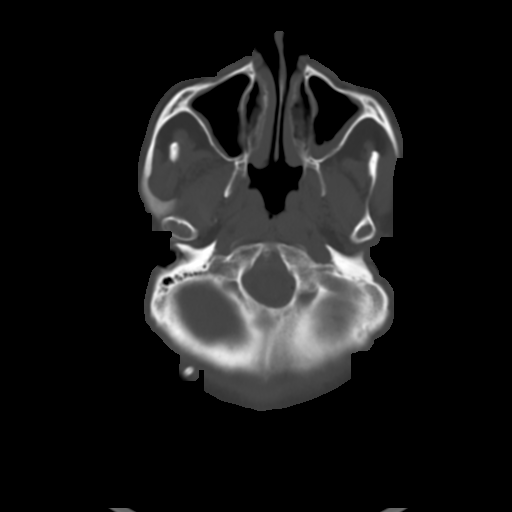
[im 7/32  brain]
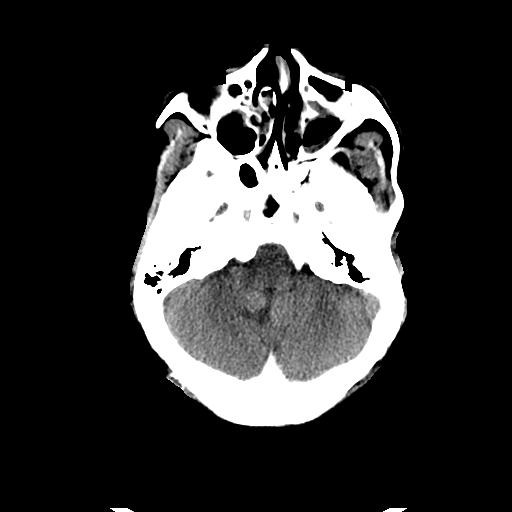
[im 11/32  brain]
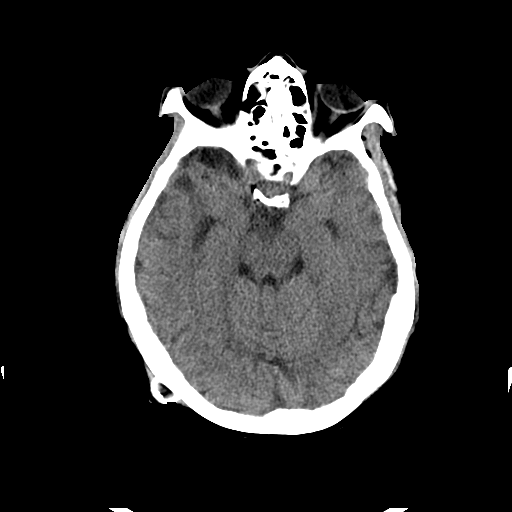
[im 14/32  brain]
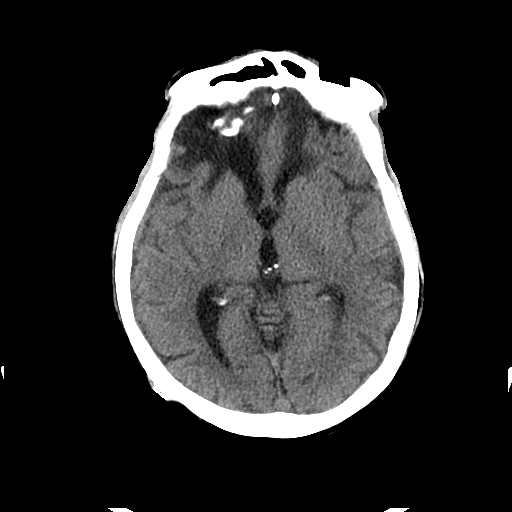
[im 18/32  brain]
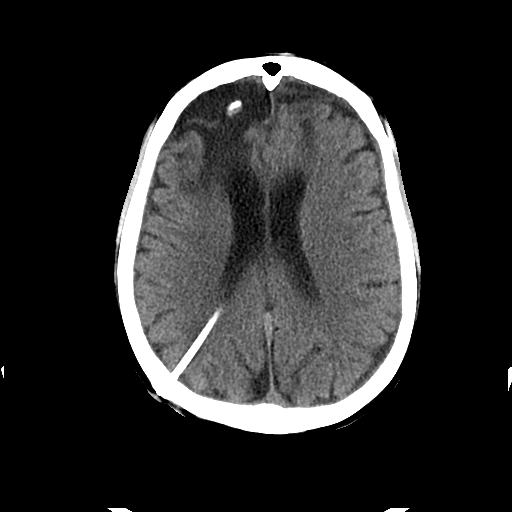
[im 18/32  bone]
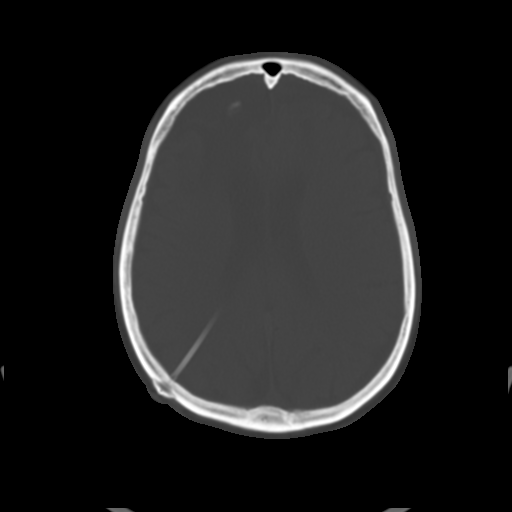
[im 21/32  brain]
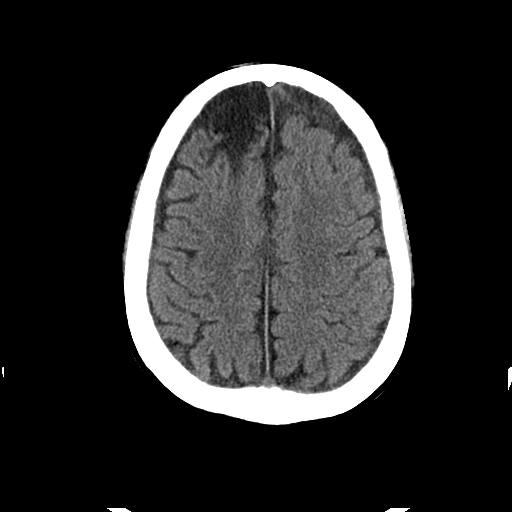
[im 25/32  brain]
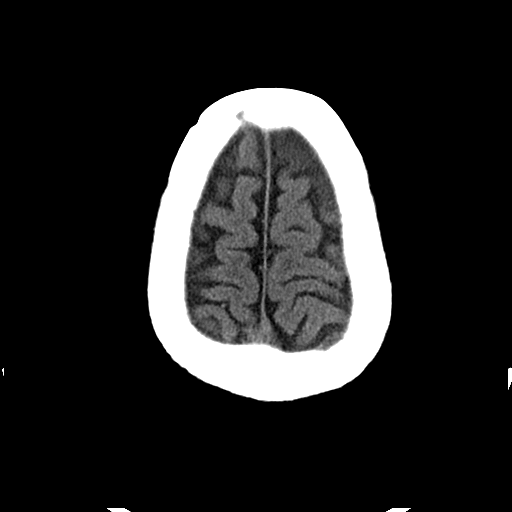
[im 28/32  brain]
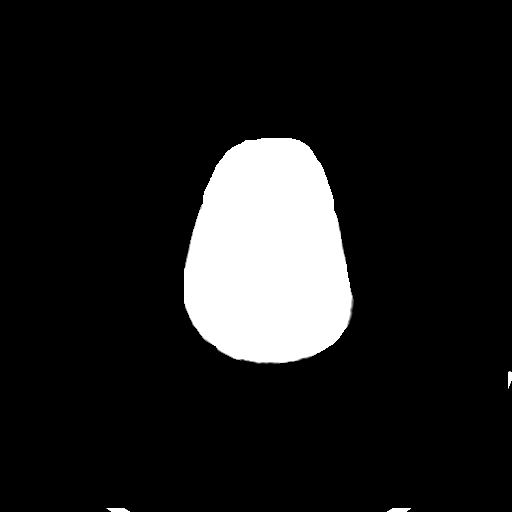

[Series 3: head w/o bone · axial · non-contrast · 0.49mm/px · z∈[+105,+228]mm · 8 of 63 slices shown]
[im 7/63  bone]
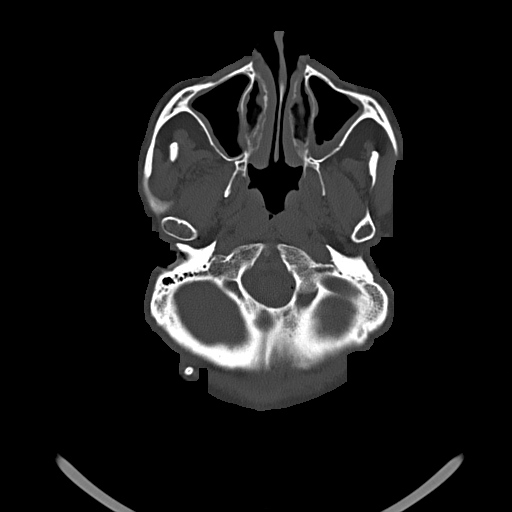
[im 14/63  bone]
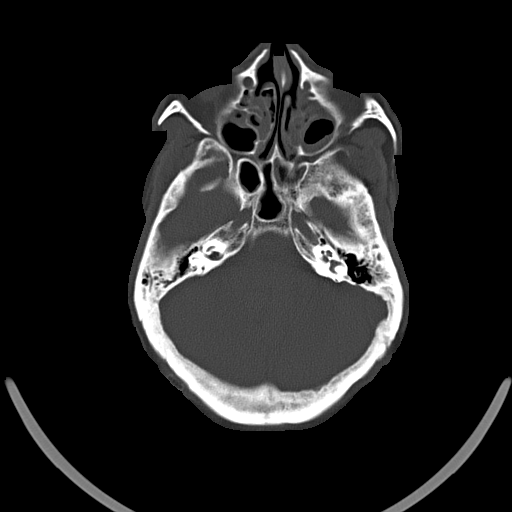
[im 20/63  bone]
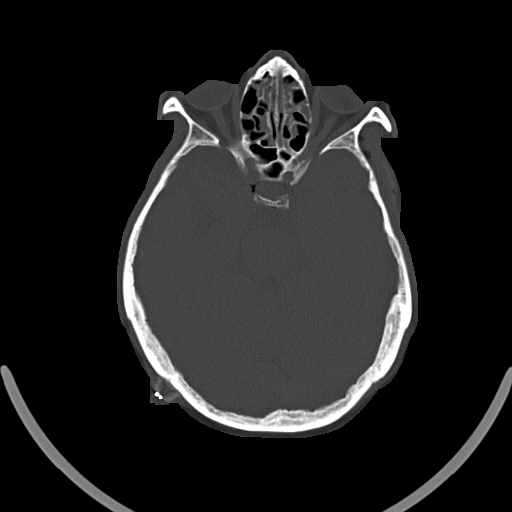
[im 27/63  bone]
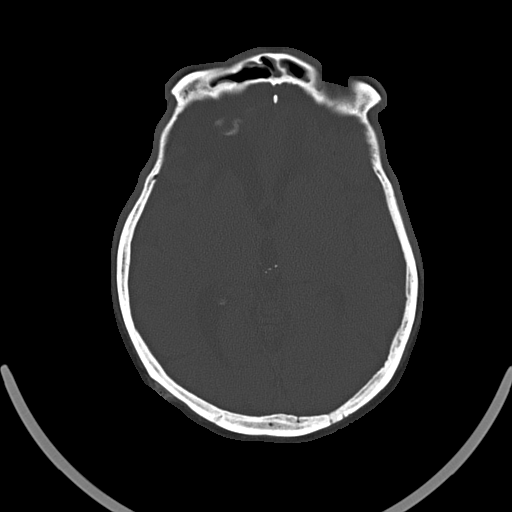
[im 36/63  bone]
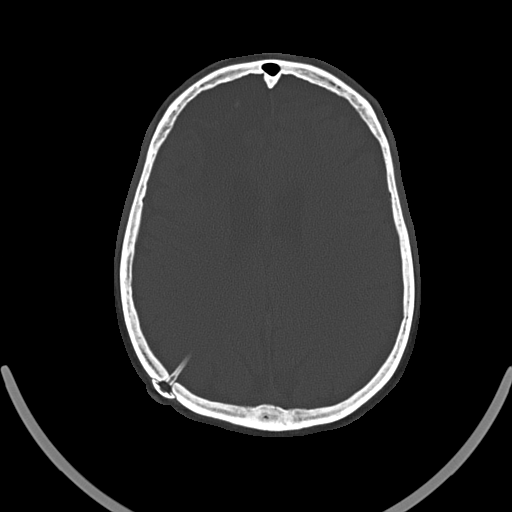
[im 43/63  bone]
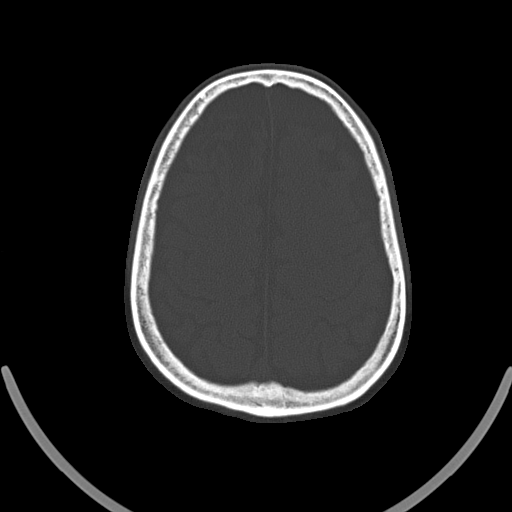
[im 49/63  bone]
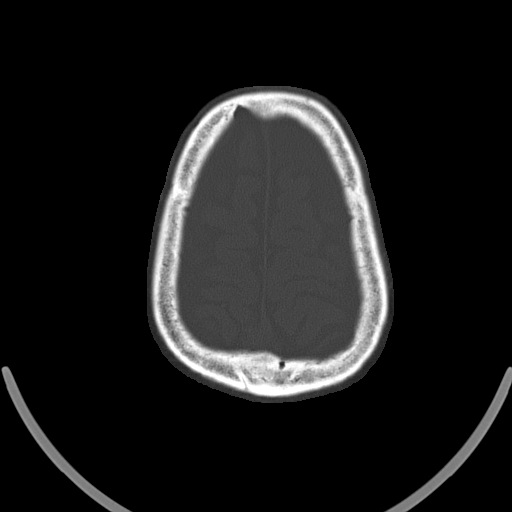
[im 56/63  bone]
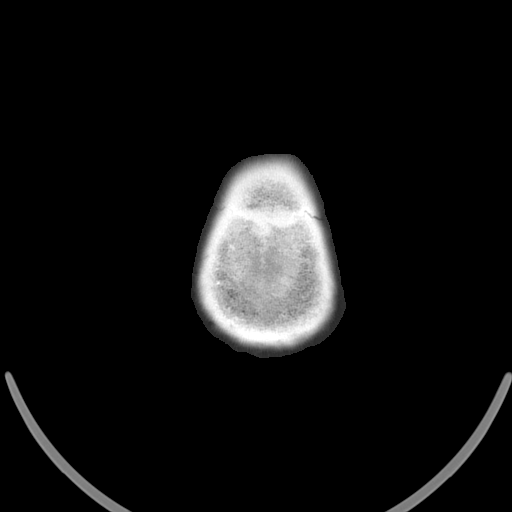

[16 of 30 positions shown; findings below may reference images not displayed]

FINDINGS: There is stable positioning of a right-sided
ventriculostomy catheter.  Ventricular size is stable with no
evidence of hydrocephalus.  Stable encephalomalacia is identified
involving both frontal lobes, right greater than left with
dystrophic calcification in the right frontal lobe. The brain
demonstrates no evidence of hemorrhage, acute infarction, edema,
mass effect, extra-axial fluid collection or mass lesion.  The
skull is unremarkable. Mucosal thickening present in ethmoid air
cells and the visualized maxillary sinuses bilaterally.
IMPRESSION: Stable head CT without acute findings.  Ventriculostomy and
ventricular size are stable.

## 2014-05-21 ENCOUNTER — Emergency Department (HOSPITAL_COMMUNITY)
Admission: EM | Admit: 2014-05-21 | Discharge: 2014-05-22 | Disposition: A | Discharge status: Home / Self Care | Payer: Medicare Other | Attending: Emergency Medicine | Admitting: Emergency Medicine

## 2014-05-21 ENCOUNTER — Encounter (HOSPITAL_COMMUNITY): Payer: Self-pay | Admitting: Emergency Medicine

## 2014-05-21 ENCOUNTER — Emergency Department (HOSPITAL_COMMUNITY): Payer: Medicare Other

## 2014-05-21 DIAGNOSIS — Z7902 Long term (current) use of antithrombotics/antiplatelets: Secondary | ICD-10-CM | POA: Insufficient documentation

## 2014-05-21 DIAGNOSIS — G40909 Epilepsy, unspecified, not intractable, without status epilepticus: Secondary | ICD-10-CM | POA: Diagnosis not present

## 2014-05-21 DIAGNOSIS — Z720 Tobacco use: Secondary | ICD-10-CM | POA: Diagnosis not present

## 2014-05-21 DIAGNOSIS — R4182 Altered mental status, unspecified: Secondary | ICD-10-CM | POA: Insufficient documentation

## 2014-05-21 DIAGNOSIS — Z8673 Personal history of transient ischemic attack (TIA), and cerebral infarction without residual deficits: Secondary | ICD-10-CM | POA: Diagnosis not present

## 2014-05-21 DIAGNOSIS — Z79899 Other long term (current) drug therapy: Secondary | ICD-10-CM | POA: Diagnosis not present

## 2014-05-21 DIAGNOSIS — Z791 Long term (current) use of non-steroidal anti-inflammatories (NSAID): Secondary | ICD-10-CM | POA: Insufficient documentation

## 2014-05-21 DIAGNOSIS — I10 Essential (primary) hypertension: Secondary | ICD-10-CM | POA: Diagnosis not present

## 2014-05-21 LAB — CBC WITH DIFFERENTIAL/PLATELET
Basophils Absolute: 0.1 10*3/uL (ref 0.0–0.1)
Basophils Relative: 1 % (ref 0–1)
Eosinophils Absolute: 0.3 10*3/uL (ref 0.0–0.7)
Eosinophils Relative: 6 % — ABNORMAL HIGH (ref 0–5)
HEMATOCRIT: 40.7 % (ref 39.0–52.0)
Hemoglobin: 13.6 g/dL (ref 13.0–17.0)
LYMPHS PCT: 38 % (ref 12–46)
Lymphs Abs: 2.3 10*3/uL (ref 0.7–4.0)
MCH: 30.5 pg (ref 26.0–34.0)
MCHC: 33.4 g/dL (ref 30.0–36.0)
MCV: 91.3 fL (ref 78.0–100.0)
MONO ABS: 0.7 10*3/uL (ref 0.1–1.0)
Monocytes Relative: 12 % (ref 3–12)
NEUTROS ABS: 2.6 10*3/uL (ref 1.7–7.7)
Neutrophils Relative %: 43 % (ref 43–77)
PLATELETS: 154 10*3/uL (ref 150–400)
RBC: 4.46 MIL/uL (ref 4.22–5.81)
RDW: 14.3 % (ref 11.5–15.5)
WBC: 5.9 10*3/uL (ref 4.0–10.5)

## 2014-05-21 LAB — RAPID URINE DRUG SCREEN, HOSP PERFORMED
AMPHETAMINES: NOT DETECTED
Barbiturates: NOT DETECTED
Benzodiazepines: POSITIVE — AB
Cocaine: NOT DETECTED
Opiates: NOT DETECTED
TETRAHYDROCANNABINOL: NOT DETECTED

## 2014-05-21 LAB — COMPREHENSIVE METABOLIC PANEL
ALT: 13 U/L (ref 0–53)
AST: 31 U/L (ref 0–37)
Albumin: 4 g/dL (ref 3.5–5.2)
Alkaline Phosphatase: 60 U/L (ref 39–117)
Anion gap: 18 — ABNORMAL HIGH (ref 5–15)
BILIRUBIN TOTAL: 0.3 mg/dL (ref 0.3–1.2)
BUN: 21 mg/dL (ref 6–23)
CHLORIDE: 107 meq/L (ref 96–112)
CO2: 19 meq/L (ref 19–32)
Calcium: 8.8 mg/dL (ref 8.4–10.5)
Creatinine, Ser: 1.27 mg/dL (ref 0.50–1.35)
GFR calc Af Amer: 68 mL/min — ABNORMAL LOW (ref >90)
GFR, EST NON AFRICAN AMERICAN: 58 mL/min — AB (ref >90)
Glucose, Bld: 84 mg/dL (ref 70–99)
Potassium: 3.9 mEq/L (ref 3.7–5.3)
SODIUM: 144 meq/L (ref 137–147)
Total Protein: 7.6 g/dL (ref 6.0–8.3)

## 2014-05-21 LAB — ETHANOL: ALCOHOL ETHYL (B): 375 mg/dL — AB (ref 0–11)

## 2014-05-21 LAB — ACETAMINOPHEN LEVEL: Acetaminophen (Tylenol), Serum: <15 ug/mL (ref 10–30)

## 2014-05-21 MED ORDER — SODIUM CHLORIDE 0.9 % IV BOLUS (SEPSIS)
1000.0000 mL | Freq: Once | INTRAVENOUS | Status: AC
  Administered 2014-05-21: 1000 mL via INTRAVENOUS

## 2014-05-21 MED ORDER — THIAMINE HCL 100 MG/ML IJ SOLN
100.0000 mg | Freq: Every day | INTRAMUSCULAR | Status: DC
  Administered 2014-05-21: 100 mg via INTRAVENOUS
  Filled 2014-05-21: qty 2

## 2014-05-21 MED ORDER — MIDAZOLAM HCL 2 MG/2ML IJ SOLN
2.0000 mg | Freq: Once | INTRAMUSCULAR | Status: AC
  Administered 2014-05-21: 2 mg via INTRAVENOUS
  Filled 2014-05-21: qty 2

## 2014-05-21 NOTE — ED Notes (Signed)
Bed: [id number] Expected date: 05/21/14 Expected time: 2:54 PM Means of arrival: Ambulance Comments: ? ETOH, confusion
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
## 2014-05-21 NOTE — ED Notes (Signed)
Patient found standing in room, covered in urine.  He is speaking, but it is unintelligible.  Assisted back to bed by three staff members.

## 2014-05-21 NOTE — ED Notes (Signed)
Pt is still attempting to get out of bed,  He took his gown off and was naked,  Pt reoriented to surroundings and once again told to stay in bed and keep gown on.

## 2014-05-21 NOTE — ED Notes (Signed)
MD at bedside. 

## 2014-05-21 NOTE — ED Notes (Signed)
Patient transported to CT 
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
## 2014-05-21 NOTE — ED Notes (Signed)
Pt moved from room 20 to 25 for better viewing,  Pt has continuously attempted to get out of bed,  Pt is intoxicated and supposedly history of CVA  ,  Unable to evaluate effects of CVA due to pt's speech and not sure if speech is due to alcohol or CVA.

## 2014-05-22 NOTE — ED Notes (Signed)
Awake. Verbally responsive. Resp even and unlabored. ABC's intact. Pt ambulating with walking cane. Gait steady. Denies pain.

## 2014-05-22 NOTE — ED Notes (Signed)
Pt d/c to lobby. Pt encouraged to contact family or wait for bus. Pt verbalized understanding. Staff attempted to contact friend without answer noted.

## 2014-05-23 ENCOUNTER — Emergency Department (HOSPITAL_COMMUNITY): Payer: Medicare Other

## 2014-05-23 ENCOUNTER — Encounter (HOSPITAL_COMMUNITY): Payer: Self-pay | Admitting: Emergency Medicine

## 2014-05-23 ENCOUNTER — Emergency Department (HOSPITAL_COMMUNITY)
Admission: EM | Admit: 2014-05-23 | Discharge: 2014-05-23 | Disposition: A | Discharge status: Home / Self Care | Payer: Medicare Other | Attending: Emergency Medicine | Admitting: Emergency Medicine

## 2014-05-23 DIAGNOSIS — Z7902 Long term (current) use of antithrombotics/antiplatelets: Secondary | ICD-10-CM | POA: Insufficient documentation

## 2014-05-23 DIAGNOSIS — Z9119 Patient's noncompliance with other medical treatment and regimen: Secondary | ICD-10-CM | POA: Diagnosis not present

## 2014-05-23 DIAGNOSIS — R4182 Altered mental status, unspecified: Secondary | ICD-10-CM | POA: Diagnosis not present

## 2014-05-23 DIAGNOSIS — Z9114 Patient's other noncompliance with medication regimen: Secondary | ICD-10-CM

## 2014-05-23 DIAGNOSIS — I10 Essential (primary) hypertension: Secondary | ICD-10-CM | POA: Insufficient documentation

## 2014-05-23 DIAGNOSIS — S0990XA Unspecified injury of head, initial encounter: Secondary | ICD-10-CM

## 2014-05-23 DIAGNOSIS — R569 Unspecified convulsions: Secondary | ICD-10-CM | POA: Diagnosis present

## 2014-05-23 DIAGNOSIS — S0081XA Abrasion of other part of head, initial encounter: Secondary | ICD-10-CM | POA: Diagnosis not present

## 2014-05-23 DIAGNOSIS — Y929 Unspecified place or not applicable: Secondary | ICD-10-CM | POA: Diagnosis not present

## 2014-05-23 DIAGNOSIS — Z79899 Other long term (current) drug therapy: Secondary | ICD-10-CM | POA: Insufficient documentation

## 2014-05-23 DIAGNOSIS — Y9389 Activity, other specified: Secondary | ICD-10-CM | POA: Insufficient documentation

## 2014-05-23 DIAGNOSIS — Z791 Long term (current) use of non-steroidal anti-inflammatories (NSAID): Secondary | ICD-10-CM | POA: Insufficient documentation

## 2014-05-23 DIAGNOSIS — X58XXXA Exposure to other specified factors, initial encounter: Secondary | ICD-10-CM | POA: Diagnosis not present

## 2014-05-23 DIAGNOSIS — Z720 Tobacco use: Secondary | ICD-10-CM | POA: Diagnosis not present

## 2014-05-23 DIAGNOSIS — Z8673 Personal history of transient ischemic attack (TIA), and cerebral infarction without residual deficits: Secondary | ICD-10-CM | POA: Diagnosis not present

## 2014-05-23 DIAGNOSIS — G40909 Epilepsy, unspecified, not intractable, without status epilepticus: Secondary | ICD-10-CM | POA: Diagnosis not present

## 2014-05-23 LAB — RAPID URINE DRUG SCREEN, HOSP PERFORMED
Amphetamines: NOT DETECTED
BARBITURATES: NOT DETECTED
Benzodiazepines: NOT DETECTED
COCAINE: NOT DETECTED
OPIATES: NOT DETECTED
TETRAHYDROCANNABINOL: NOT DETECTED

## 2014-05-23 LAB — PHENYTOIN LEVEL, TOTAL: Phenytoin Lvl: <2.5 ug/mL — ABNORMAL LOW (ref 10.0–20.0)

## 2014-05-23 LAB — CBG MONITORING, ED: Glucose-Capillary: 81 mg/dL (ref 70–99)

## 2014-05-23 LAB — ETHANOL: Alcohol, Ethyl (B): <11 mg/dL (ref 0–11)

## 2014-05-23 MED ORDER — PHENYTOIN SODIUM EXTENDED 100 MG PO CAPS
300.0000 mg | ORAL_CAPSULE | Freq: Once | ORAL | Status: AC
  Administered 2014-05-23: 300 mg via ORAL
  Filled 2014-05-23: qty 3

## 2014-05-23 MED ORDER — PHENYTOIN SODIUM EXTENDED 100 MG PO CAPS
400.0000 mg | ORAL_CAPSULE | Freq: Once | ORAL | Status: AC
  Administered 2014-05-23: 400 mg via ORAL
  Filled 2014-05-23: qty 4
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
## 2014-05-23 NOTE — ED Notes (Signed)
Bed: [id number] Expected date:  Expected time:  Means of arrival:  Comments: EMS 63yo M ? Seizure, found on ground shaking; hx of dementia and seizures

## 2014-05-23 NOTE — ED Notes (Signed)
CT of spine in process. Will attempt to obtain urine when results are available.
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
## 2014-05-23 NOTE — Discharge Instructions (Signed)
Take all of your medicines, as prescribed. Take your usual dose of Dilantin (phenytoin), this evening. Get an appointment to see your primary care doctor in 3 days.   Abrasion An abrasion is a cut or scrape of the skin. Abrasions do not extend through all layers of the skin and most heal within 10 days. It is important to care for your abrasion properly to prevent infection. CAUSES  Most abrasions are caused by falling on, or gliding across, the ground or other surface. When your skin rubs on something, the outer and inner layer of skin rubs off, causing an abrasion. DIAGNOSIS  Your caregiver will be able to diagnose an abrasion during a physical exam.  TREATMENT  Your treatment depends on how large and deep the abrasion is. Generally, your abrasion will be cleaned with water and a mild soap to remove any dirt or debris. An antibiotic ointment may be put over the abrasion to prevent an infection. A bandage (dressing) may be wrapped around the abrasion to keep it from getting dirty.  You may need a tetanus shot if:  You cannot remember when you had your last tetanus shot.  You have never had a tetanus shot.  The injury broke your skin. If you get a tetanus shot, your arm may swell, get red, and feel warm to the touch. This is common and not a problem. If you need a tetanus shot and you choose not to have one, there is a rare chance of getting tetanus. Sickness from tetanus can be serious.  HOME CARE INSTRUCTIONS   If a dressing was applied, change it at least once a day or as directed by your caregiver. If the bandage sticks, soak it off with warm water.   Wash the area with water and a mild soap to remove all the ointment 2 times a day. Rinse off the soap and pat the area dry with a clean towel.   Reapply any ointment as directed by your caregiver. This will help prevent infection and keep the bandage from sticking. Use gauze over the wound and under the dressing to help keep the bandage  from sticking.   Change your dressing right away if it becomes wet or dirty.   Only take over-the-counter or prescription medicines for pain, discomfort, or fever as directed by your caregiver.   Follow up with your caregiver within 24-48 hours for a wound check, or as directed. If you were not given a wound-check appointment, look closely at your abrasion for redness, swelling, or pus. These are signs of infection. SEEK IMMEDIATE MEDICAL CARE IF:   You have increasing pain in the wound.   You have redness, swelling, or tenderness around the wound.   You have pus coming from the wound.   You have a fever or persistent symptoms for more than 2-3 days.  You have a fever and your symptoms suddenly get worse.  You have a bad smell coming from the wound or dressing.  MAKE SURE YOU:   Understand these instructions.  Will watch your condition.  Will get help right away if you are not doing well or get worse. Document Released: 04/24/2005 Document Revised: 07/01/2012 Document Reviewed: 06/18/2011 [facility] Patient Information 2015 [place], [place]. This information is not intended to replace advice given to you by your health care provider. Make sure you discuss any questions you have with your health care provider.  Head Injury You have a head injury. Headaches and throwing up (vomiting) are common after  a head injury. It should be easy to wake up from sleeping. Sometimes you must stay in the hospital. Most problems happen within the first 24 hours. Side effects may occur up to 7-10 days after the injury.  WHAT ARE THE TYPES OF HEAD INJURIES? Head injuries can be as minor as a bump. Some head injuries can be more severe. More severe head injuries include:  A jarring injury to the brain (concussion).  A bruise of the brain (contusion). This mean there is bleeding in the brain that can cause swelling.  A cracked skull (skull fracture).  Bleeding in the brain that collects, clots,  and forms a bump (hematoma). WHEN SHOULD I GET HELP RIGHT AWAY?   You are confused or sleepy.  You cannot be woken up.  You feel sick to your stomach (nauseous) or keep throwing up (vomiting).  Your dizziness or unsteadiness is getting worse.  You have very bad, lasting headaches that are not helped by medicine. Take medicines only as told by your doctor.  You cannot use your arms or legs like normal.  You cannot walk.  You notice changes in the black spots in the center of the colored part of your eye (pupil).  You have clear or bloody fluid coming from your nose or ears.  You have trouble seeing. During the next 24 hours after the injury, you must stay with someone who can watch you. This person should get help right away (call 911 in the [place] if you start to shake and are not able to control it (have seizures), you pass out, or you are unable to wake up. HOW CAN I PREVENT A HEAD INJURY IN THE FUTURE?  Wear seat belts.  Wear a helmet while bike riding and playing sports like football.  Stay away from dangerous activities around the house. WHEN CAN I RETURN TO NORMAL ACTIVITIES AND ATHLETICS? See your doctor before doing these activities. You should not do normal activities or play contact sports until 1 week after the following symptoms have stopped:  Headache that does not go away.  Dizziness.  Poor attention.  Confusion.  Memory problems.  Sickness to your stomach or throwing up.  Tiredness.  Fussiness.  Bothered by bright lights or loud noises.  Anxiousness or depression.  Restless sleep. MAKE SURE YOU:   Understand these instructions.  Will watch your condition.  Will get help right away if you are not doing well or get worse. Document Released: 06/27/2008 Document Revised: 11/29/2013 Document Reviewed: 03/22/2013 [facility] Patient Information 2015 [place], [place]. This information is not intended to replace advice given to you by your health care  provider. Make sure you discuss any questions you have with your health care provider.  Seizure, Adult A seizure means there is unusual activity in the brain. A seizure can cause changes in attention or behavior. Seizures often cause shaking (convulsions). Seizures often last from 30 seconds to 2 minutes. HOME CARE   If you are given medicines, take them exactly as told by your doctor.  Keep all doctor visits as told.  Do not swim or drive until your doctor says it is okay.  Teach others what to do if you have a seizure. They should:  Lay you on the ground.  Put a cushion under your head.  Loosen any tight clothing around your neck.  Turn you on your side.  Stay with you until you get better. GET HELP RIGHT AWAY IF:   The seizure lasts longer than  2 to 5 minutes.  The seizure is very bad.  The person does not wake up after the seizure.  The person's attention or behavior changes. Drive the person to the emergency room or call your local emergency services (911 in [place] MAKE SURE YOU:   Understand these instructions.  Will watch your condition.  Will get help right away if you are not doing well or get worse. Document Released: 01/01/2008 Document Revised: 10/07/2011 Document Reviewed: 07/03/2011 [facility] Patient Information 2015 [place], [place]. This information is not intended to replace advice given to you by your health care provider. Make sure you discuss any questions you have with your health care provider.
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
## 2014-05-23 NOTE — Progress Notes (Signed)
CSW and CSW colleague met with patient and assessed his current needs. Pt stated that [place] and sometimes he lives with his girlfriend. Pt also stated that he is aware about his medications and he usually receives them from a Product/process development scientist. Pt states he has some of his medications at home. CSW gave the Pt at list of homeless shelters and resources.      Willette Brace [phone] ED CSW .05/23/2014  10:57AM

## 2014-07-16 ENCOUNTER — Emergency Department (HOSPITAL_COMMUNITY)
Admission: EM | Admit: 2014-07-16 | Discharge: 2014-07-16 | Disposition: A | Discharge status: Home / Self Care | Payer: Medicare Other | Attending: Emergency Medicine | Admitting: Emergency Medicine

## 2014-07-16 ENCOUNTER — Encounter (HOSPITAL_COMMUNITY): Payer: Self-pay | Admitting: Emergency Medicine

## 2014-07-16 ENCOUNTER — Emergency Department (HOSPITAL_COMMUNITY): Payer: Medicare Other

## 2014-07-16 DIAGNOSIS — F1010 Alcohol abuse, uncomplicated: Secondary | ICD-10-CM

## 2014-07-16 DIAGNOSIS — R110 Nausea: Secondary | ICD-10-CM | POA: Diagnosis not present

## 2014-07-16 DIAGNOSIS — I10 Essential (primary) hypertension: Secondary | ICD-10-CM | POA: Diagnosis not present

## 2014-07-16 DIAGNOSIS — Z8673 Personal history of transient ischemic attack (TIA), and cerebral infarction without residual deficits: Secondary | ICD-10-CM | POA: Insufficient documentation

## 2014-07-16 DIAGNOSIS — Z720 Tobacco use: Secondary | ICD-10-CM | POA: Diagnosis not present

## 2014-07-16 DIAGNOSIS — R4182 Altered mental status, unspecified: Secondary | ICD-10-CM | POA: Diagnosis present

## 2014-07-16 LAB — URINALYSIS, ROUTINE W REFLEX MICROSCOPIC
Bilirubin Urine: NEGATIVE
GLUCOSE, UA: NEGATIVE mg/dL
Hgb urine dipstick: NEGATIVE
Ketones, ur: NEGATIVE mg/dL
LEUKOCYTES UA: NEGATIVE
Nitrite: NEGATIVE
PH: 5 (ref 5.0–8.0)
PROTEIN: NEGATIVE mg/dL
Specific Gravity, Urine: 1.013 (ref 1.005–1.030)
Urobilinogen, UA: 0.2 mg/dL (ref 0.0–1.0)

## 2014-07-16 LAB — COMPREHENSIVE METABOLIC PANEL
ALBUMIN: 3.5 g/dL (ref 3.5–5.2)
ALT: 20 U/L (ref 0–53)
AST: 29 U/L (ref 0–37)
Alkaline Phosphatase: 60 U/L (ref 39–117)
Anion gap: 17 — ABNORMAL HIGH (ref 5–15)
BUN: 23 mg/dL (ref 6–23)
CALCIUM: 9.3 mg/dL (ref 8.4–10.5)
CO2: 20 mEq/L (ref 19–32)
Chloride: 102 mEq/L (ref 96–112)
Creatinine, Ser: 0.94 mg/dL (ref 0.50–1.35)
GFR calc Af Amer: >90 mL/min (ref >90)
GFR calc non Af Amer: 87 mL/min — ABNORMAL LOW (ref >90)
Glucose, Bld: 84 mg/dL (ref 70–99)
Potassium: 3.4 mEq/L — ABNORMAL LOW (ref 3.7–5.3)
Sodium: 139 mEq/L (ref 137–147)
Total Bilirubin: 0.2 mg/dL — ABNORMAL LOW (ref 0.3–1.2)
Total Protein: 7.4 g/dL (ref 6.0–8.3)

## 2014-07-16 LAB — ETHANOL: Alcohol, Ethyl (B): 168 mg/dL — ABNORMAL HIGH (ref 0–11)

## 2014-07-16 LAB — CBC
HCT: 38.1 % — ABNORMAL LOW (ref 39.0–52.0)
Hemoglobin: 12.6 g/dL — ABNORMAL LOW (ref 13.0–17.0)
MCH: 30.1 pg (ref 26.0–34.0)
MCHC: 33.1 g/dL (ref 30.0–36.0)
MCV: 91.1 fL (ref 78.0–100.0)
PLATELETS: 197 10*3/uL (ref 150–400)
RBC: 4.18 MIL/uL — ABNORMAL LOW (ref 4.22–5.81)
RDW: 14.6 % (ref 11.5–15.5)
WBC: 7.3 10*3/uL (ref 4.0–10.5)

## 2014-07-16 LAB — CBG MONITORING, ED: Glucose-Capillary: 92 mg/dL (ref 70–99)

## 2014-07-16 NOTE — ED Notes (Signed)
Patient ambulate to restroom with stand by assist, gait improved. Ambulated approximately 150 feet.
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
## 2014-07-16 NOTE — ED Notes (Signed)
Bair hugger removed  

## 2014-07-16 NOTE — ED Notes (Signed)
bair hugger applied.
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
## 2014-07-16 NOTE — ED Notes (Signed)
cbg 92 

## 2014-07-16 NOTE — ED Notes (Signed)
On return to room, patient changed into paper scrubs, and he sits in chair to finish eating sandwich.  Full linen change.

## 2014-07-16 NOTE — ED Notes (Signed)
Patient found eating [employer].

## 2014-07-16 NOTE — Discharge Instructions (Signed)
Alcohol Intoxication Mr. Bastone, you seen today for alcohol intoxication. Follow-up with a primary care physician for treatment on how to stop. If symptoms worsen come back to emergency department. Thank you. Alcohol intoxication occurs when you drink enough alcohol that it affects your ability to function. It can be mild or very severe. Drinking a lot of alcohol in a short time is called binge drinking. This can be very harmful. Drinking alcohol can also be more dangerous if you are taking medicines or other drugs. Some of the effects caused by alcohol may include:  Loss of coordination.  Changes in mood and behavior.  Unclear thinking.  Trouble talking (slurred speech).  Throwing up (vomiting).  Confusion.  Slowed breathing.  Twitching and shaking (seizures).  Loss of consciousness. HOME CARE  Do not drive after drinking alcohol.  Drink enough water and fluids to keep your pee (urine) clear or pale yellow. Avoid caffeine.  Only take medicine as told by your doctor. GET HELP IF:  You throw up (vomit) many times.  You do not feel better after a few days.  You frequently have alcohol intoxication. Your doctor can help decide if you should see a substance use treatment counselor. GET HELP RIGHT AWAY IF:  You become shaky when you stop drinking.  You have twitching and shaking.  You throw up blood. It may look bright red or like coffee grounds.  You notice blood in your poop (bowel movements).  You become lightheaded or pass out (faint). MAKE SURE YOU:   Understand these instructions.  Will watch your condition.  Will get help right away if you are not doing well or get worse. Document Released: 01/01/2008 Document Revised: 03/17/2013 Document Reviewed: 12/18/2012 [facility] Patient Information 2015 [place], [place]. This information is not intended to replace advice given to you by your health care provider. Make sure you discuss any questions you have with your  health care provider.

## 2014-07-16 NOTE — ED Notes (Signed)
Patient sleeping

## 2014-09-07 ENCOUNTER — Emergency Department (HOSPITAL_COMMUNITY): Payer: Medicare Other

## 2014-09-07 ENCOUNTER — Observation Stay (HOSPITAL_COMMUNITY): Payer: Medicare Other

## 2014-09-07 ENCOUNTER — Encounter (HOSPITAL_COMMUNITY): Payer: Self-pay

## 2014-09-07 ENCOUNTER — Inpatient Hospital Stay (HOSPITAL_COMMUNITY)
Admission: EM | Admit: 2014-09-07 | Discharge: 2014-09-11 | DRG: 871 | Disposition: A | Discharge status: Home / Self Care | Payer: Medicare Other | Attending: Internal Medicine | Admitting: Internal Medicine

## 2014-09-07 DIAGNOSIS — X31XXXA Exposure to excessive natural cold, initial encounter: Secondary | ICD-10-CM | POA: Diagnosis present

## 2014-09-07 DIAGNOSIS — F1010 Alcohol abuse, uncomplicated: Secondary | ICD-10-CM | POA: Diagnosis present

## 2014-09-07 DIAGNOSIS — Z8673 Personal history of transient ischemic attack (TIA), and cerebral infarction without residual deficits: Secondary | ICD-10-CM

## 2014-09-07 DIAGNOSIS — Z87891 Personal history of nicotine dependence: Secondary | ICD-10-CM | POA: Diagnosis present

## 2014-09-07 DIAGNOSIS — E860 Dehydration: Secondary | ICD-10-CM | POA: Diagnosis present

## 2014-09-07 DIAGNOSIS — Z5900 Homelessness unspecified: Secondary | ICD-10-CM

## 2014-09-07 DIAGNOSIS — N179 Acute kidney failure, unspecified: Secondary | ICD-10-CM | POA: Diagnosis present

## 2014-09-07 DIAGNOSIS — E872 Acidosis, unspecified: Secondary | ICD-10-CM | POA: Diagnosis present

## 2014-09-07 DIAGNOSIS — A419 Sepsis, unspecified organism: Principal | ICD-10-CM | POA: Diagnosis present

## 2014-09-07 DIAGNOSIS — G40909 Epilepsy, unspecified, not intractable, without status epilepticus: Secondary | ICD-10-CM | POA: Diagnosis present

## 2014-09-07 DIAGNOSIS — Z720 Tobacco use: Secondary | ICD-10-CM

## 2014-09-07 DIAGNOSIS — R05 Cough: Secondary | ICD-10-CM

## 2014-09-07 DIAGNOSIS — Z91120 Patient's intentional underdosing of medication regimen due to financial hardship: Secondary | ICD-10-CM | POA: Diagnosis present

## 2014-09-07 DIAGNOSIS — R197 Diarrhea, unspecified: Secondary | ICD-10-CM | POA: Diagnosis not present

## 2014-09-07 DIAGNOSIS — R7889 Finding of other specified substances, not normally found in blood: Secondary | ICD-10-CM | POA: Diagnosis present

## 2014-09-07 DIAGNOSIS — Z982 Presence of cerebrospinal fluid drainage device: Secondary | ICD-10-CM | POA: Diagnosis not present

## 2014-09-07 DIAGNOSIS — T420X6A Underdosing of hydantoin derivatives, initial encounter: Secondary | ICD-10-CM | POA: Diagnosis present

## 2014-09-07 DIAGNOSIS — M25551 Pain in right hip: Secondary | ICD-10-CM

## 2014-09-07 DIAGNOSIS — J690 Pneumonitis due to inhalation of food and vomit: Secondary | ICD-10-CM | POA: Diagnosis present

## 2014-09-07 DIAGNOSIS — J449 Chronic obstructive pulmonary disease, unspecified: Secondary | ICD-10-CM | POA: Diagnosis present

## 2014-09-07 DIAGNOSIS — D72829 Elevated white blood cell count, unspecified: Secondary | ICD-10-CM | POA: Diagnosis present

## 2014-09-07 DIAGNOSIS — F17210 Nicotine dependence, cigarettes, uncomplicated: Secondary | ICD-10-CM | POA: Diagnosis present

## 2014-09-07 DIAGNOSIS — R410 Disorientation, unspecified: Secondary | ICD-10-CM

## 2014-09-07 DIAGNOSIS — T68XXXA Hypothermia, initial encounter: Secondary | ICD-10-CM | POA: Diagnosis present

## 2014-09-07 DIAGNOSIS — R569 Unspecified convulsions: Secondary | ICD-10-CM

## 2014-09-07 DIAGNOSIS — R059 Cough, unspecified: Secondary | ICD-10-CM

## 2014-09-07 DIAGNOSIS — I10 Essential (primary) hypertension: Secondary | ICD-10-CM | POA: Diagnosis present

## 2014-09-07 DIAGNOSIS — G9340 Encephalopathy, unspecified: Secondary | ICD-10-CM | POA: Diagnosis present

## 2014-09-07 HISTORY — DX: Acute embolism and thrombosis of unspecified deep veins of unspecified lower extremity: I82.409

## 2014-09-07 HISTORY — DX: Unspecified injury of head, initial encounter: S09.90XA

## 2014-09-07 HISTORY — DX: Cardiac murmur, unspecified: R01.1

## 2014-09-07 HISTORY — DX: Post-traumatic hydrocephalus, unspecified: G91.3

## 2014-09-07 LAB — CBC WITH DIFFERENTIAL/PLATELET
BASOS ABS: 0 10*3/uL (ref 0.0–0.1)
Basophils Relative: 0 % (ref 0–1)
EOS PCT: 0 % (ref 0–5)
Eosinophils Absolute: 0 10*3/uL (ref 0.0–0.7)
HCT: 40.6 % (ref 39.0–52.0)
Hemoglobin: 13.1 g/dL (ref 13.0–17.0)
LYMPHS PCT: 12 % (ref 12–46)
Lymphs Abs: 1.5 10*3/uL (ref 0.7–4.0)
MCH: 29.9 pg (ref 26.0–34.0)
MCHC: 32.3 g/dL (ref 30.0–36.0)
MCV: 92.7 fL (ref 78.0–100.0)
MONO ABS: 1 10*3/uL (ref 0.1–1.0)
Monocytes Relative: 8 % (ref 3–12)
NEUTROS PCT: 80 % — AB (ref 43–77)
Neutro Abs: 9.5 10*3/uL — ABNORMAL HIGH (ref 1.7–7.7)
Platelets: 161 10*3/uL (ref 150–400)
RBC: 4.38 MIL/uL (ref 4.22–5.81)
RDW: 14.3 % (ref 11.5–15.5)
WBC: 12 10*3/uL — ABNORMAL HIGH (ref 4.0–10.5)

## 2014-09-07 LAB — I-STAT ARTERIAL BLOOD GAS, ED
ACID-BASE DEFICIT: 5 mmol/L — AB (ref 0.0–2.0)
Bicarbonate: 18.7 mEq/L — ABNORMAL LOW (ref 20.0–24.0)
O2 SAT: 95 %
PCO2 ART: 30.6 mmHg — AB (ref 35.0–45.0)
Patient temperature: 98.6
TCO2: 20 mmol/L (ref 0–100)
pH, Arterial: 7.394 (ref 7.350–7.450)
pO2, Arterial: 72 mmHg — ABNORMAL LOW (ref 80.0–100.0)

## 2014-09-07 LAB — COMPREHENSIVE METABOLIC PANEL
ALK PHOS: 50 U/L (ref 39–117)
ALT: 17 U/L (ref 0–53)
ANION GAP: 14 (ref 5–15)
AST: 28 U/L (ref 0–37)
Albumin: 4.4 g/dL (ref 3.5–5.2)
BILIRUBIN TOTAL: 0.8 mg/dL (ref 0.3–1.2)
BUN: 25 mg/dL — ABNORMAL HIGH (ref 6–23)
CALCIUM: 9.4 mg/dL (ref 8.4–10.5)
CHLORIDE: 110 mmol/L (ref 96–112)
CO2: 17 mmol/L — ABNORMAL LOW (ref 19–32)
Creatinine, Ser: 2.21 mg/dL — ABNORMAL HIGH (ref 0.50–1.35)
GFR calc Af Amer: 35 mL/min — ABNORMAL LOW (ref >90)
GFR, EST NON AFRICAN AMERICAN: 30 mL/min — AB (ref >90)
Glucose, Bld: 74 mg/dL (ref 70–99)
POTASSIUM: 3.9 mmol/L (ref 3.5–5.1)
Sodium: 141 mmol/L (ref 135–145)
Total Protein: 7.8 g/dL (ref 6.0–8.3)

## 2014-09-07 LAB — LACTIC ACID, PLASMA: Lactic Acid, Venous: 1 mmol/L (ref 0.5–2.0)

## 2014-09-07 LAB — PROCALCITONIN: PROCALCITONIN: 2.58 ng/mL

## 2014-09-07 LAB — URINALYSIS, ROUTINE W REFLEX MICROSCOPIC
Bilirubin Urine: NEGATIVE
Glucose, UA: NEGATIVE mg/dL
HGB URINE DIPSTICK: NEGATIVE
Ketones, ur: 15 mg/dL — AB
Leukocytes, UA: NEGATIVE
NITRITE: NEGATIVE
PROTEIN: NEGATIVE mg/dL
Specific Gravity, Urine: 1.017 (ref 1.005–1.030)
Urobilinogen, UA: 0.2 mg/dL (ref 0.0–1.0)
pH: 5 (ref 5.0–8.0)

## 2014-09-07 LAB — CBC
HEMATOCRIT: 35.1 % — AB (ref 39.0–52.0)
HEMOGLOBIN: 11.4 g/dL — AB (ref 13.0–17.0)
MCH: 30 pg (ref 26.0–34.0)
MCHC: 32.5 g/dL (ref 30.0–36.0)
MCV: 92.4 fL (ref 78.0–100.0)
Platelets: 146 10*3/uL — ABNORMAL LOW (ref 150–400)
RBC: 3.8 MIL/uL — ABNORMAL LOW (ref 4.22–5.81)
RDW: 14.5 % (ref 11.5–15.5)
WBC: 7.9 10*3/uL (ref 4.0–10.5)

## 2014-09-07 LAB — CREATININE, SERUM
Creatinine, Ser: 1.42 mg/dL — ABNORMAL HIGH (ref 0.50–1.35)
GFR calc non Af Amer: 51 mL/min — ABNORMAL LOW (ref >90)
GFR, EST AFRICAN AMERICAN: 59 mL/min — AB (ref >90)

## 2014-09-07 LAB — PHENYTOIN LEVEL, TOTAL: Phenytoin Lvl: <2.5 ug/mL — ABNORMAL LOW (ref 10.0–20.0)

## 2014-09-07 LAB — RAPID URINE DRUG SCREEN, HOSP PERFORMED
AMPHETAMINES: NOT DETECTED
Barbiturates: NOT DETECTED
Benzodiazepines: NOT DETECTED
Cocaine: NOT DETECTED
OPIATES: NOT DETECTED
TETRAHYDROCANNABINOL: NOT DETECTED

## 2014-09-07 LAB — CK: Total CK: 544 U/L — ABNORMAL HIGH (ref 7–232)

## 2014-09-07 LAB — ETHANOL: Alcohol, Ethyl (B): <5 mg/dL (ref 0–9)

## 2014-09-07 LAB — AMMONIA: Ammonia: 21 umol/L (ref 11–32)

## 2014-09-07 MED ORDER — ONDANSETRON HCL 4 MG/2ML IJ SOLN: 4.0000 mg | Freq: Four times a day (QID) | INTRAMUSCULAR | Status: DC | PRN

## 2014-09-07 MED ORDER — HEPARIN SODIUM (PORCINE) 5000 UNIT/ML IJ SOLN
5000.0000 [IU] | Freq: Three times a day (TID) | INTRAMUSCULAR | Status: DC
  Administered 2014-09-07 – 2014-09-11 (×11): 5000 [IU] via SUBCUTANEOUS
  Filled 2014-09-07 (×15): qty 1

## 2014-09-07 MED ORDER — DEXTROSE 5 % IV SOLN
500.0000 mg | INTRAVENOUS | Status: DC
  Administered 2014-09-07: 500 mg via INTRAVENOUS
  Filled 2014-09-07: qty 500

## 2014-09-07 MED ORDER — THIAMINE HCL 100 MG/ML IJ SOLN
100.0000 mg | Freq: Once | INTRAMUSCULAR | Status: AC
  Administered 2014-09-07: 100 mg via INTRAMUSCULAR
  Filled 2014-09-07: qty 2

## 2014-09-07 MED ORDER — ONDANSETRON HCL 4 MG PO TABS: 4.0000 mg | ORAL_TABLET | Freq: Four times a day (QID) | ORAL | Status: DC | PRN

## 2014-09-07 MED ORDER — CEFTRIAXONE SODIUM IN DEXTROSE 20 MG/ML IV SOLN
1.0000 g | INTRAVENOUS | Status: DC
  Administered 2014-09-07: 1 g via INTRAVENOUS
  Filled 2014-09-07: qty 50

## 2014-09-07 MED ORDER — SODIUM CHLORIDE 0.9 % IJ SOLN
3.0000 mL | Freq: Two times a day (BID) | INTRAMUSCULAR | Status: DC
  Administered 2014-09-08 – 2014-09-11 (×6): 3 mL via INTRAVENOUS

## 2014-09-07 MED ORDER — ACETAMINOPHEN 650 MG RE SUPP: 650.0000 mg | Freq: Four times a day (QID) | RECTAL | Status: DC | PRN

## 2014-09-07 MED ORDER — SODIUM CHLORIDE 0.9 % IV SOLN
INTRAVENOUS | Status: AC
  Administered 2014-09-07 – 2014-09-08 (×2): via INTRAVENOUS

## 2014-09-07 MED ORDER — SODIUM CHLORIDE 0.9 % IV BOLUS (SEPSIS)
1000.0000 mL | Freq: Once | INTRAVENOUS | Status: AC
  Administered 2014-09-07: 1000 mL via INTRAVENOUS

## 2014-09-07 MED ORDER — PNEUMOCOCCAL VAC POLYVALENT 25 MCG/0.5ML IJ INJ
0.5000 mL | INJECTION | INTRAMUSCULAR | Status: AC
  Administered 2014-09-09: 0.5 mL via INTRAMUSCULAR
  Filled 2014-09-07: qty 0.5

## 2014-09-07 MED ORDER — ALUM & MAG HYDROXIDE-SIMETH 200-200-20 MG/5ML PO SUSP: 30.0000 mL | Freq: Four times a day (QID) | ORAL | Status: DC | PRN

## 2014-09-07 MED ORDER — ADULT MULTIVITAMIN W/MINERALS CH
1.0000 | ORAL_TABLET | Freq: Every day | ORAL | Status: DC
  Administered 2014-09-07 – 2014-09-11 (×5): 1 via ORAL
  Filled 2014-09-07 (×5): qty 1

## 2014-09-07 MED ORDER — SODIUM CHLORIDE 0.9 % IV BOLUS (SEPSIS): 1000.0000 mL | Freq: Once | INTRAVENOUS | Status: DC

## 2014-09-07 MED ORDER — CETYLPYRIDINIUM CHLORIDE 0.05 % MT LIQD
7.0000 mL | Freq: Two times a day (BID) | OROMUCOSAL | Status: DC
  Administered 2014-09-07 – 2014-09-09 (×3): 7 mL via OROMUCOSAL

## 2014-09-07 MED ORDER — OXYCODONE HCL 5 MG PO TABS: 5.0000 mg | ORAL_TABLET | ORAL | Status: DC | PRN

## 2014-09-07 MED ORDER — DOCUSATE SODIUM 100 MG PO CAPS
100.0000 mg | ORAL_CAPSULE | Freq: Two times a day (BID) | ORAL | Status: DC
  Administered 2014-09-07 – 2014-09-11 (×5): 100 mg via ORAL
  Filled 2014-09-07 (×10): qty 1

## 2014-09-07 MED ORDER — ACETAMINOPHEN 325 MG PO TABS: 650.0000 mg | ORAL_TABLET | Freq: Four times a day (QID) | ORAL | Status: DC | PRN

## 2014-09-07 NOTE — ED Notes (Signed)
Pt is awake, given happy meal. IVF finished.

## 2014-09-07 NOTE — ED Notes (Signed)
Pt is confused to what month it is.

## 2014-09-07 NOTE — ED Notes (Signed)
Admitting MD at the bedside.  

## 2014-09-07 NOTE — ED Notes (Addendum)
PER EMS: [place] and had been out there all night. He reports left leg pain that started around an hour ago. A&Ox4.Pt shivering upon arrival.

## 2014-09-07 NOTE — ED Provider Notes (Signed)
Patient signed out to me by Dr. Judd Lienelo to follow-up after IV fluids. [place] cardboard [place]. He reports that he had been there all night. Patient was hypothermic upon arrival. Patient was treated with warm fluids and bear hugger. Patient's temperature has improved. He was receiving IV fluids at time of signout. I did recheck him, patient was very somnolent. He did awaken to voice, but appears confused. Patient was sent to radiology for CT head, as he does have a history of VP shunt. No apparent shunt abnormality noted. CT was unchanged from previous.  Patient noted to have difficulty with ambulation. He was unable to walk to the bathroom because he is extremely unsteady.  Etiology of confusion is unclear. I do not have a baseline for the patient. He does have a previous history of alcoholism, alcohol level was negative upon arrival to the ER. He does not, however, have vital signs to support withdrawal.  Patient will require hospitalization for further management of this acute encephalopathy of unclear etiology. Will be admitted to the hospitalist service.  Gilda Creasehristopher J. Trea Carnegie, MD 09/07/14 1414

## 2014-09-07 NOTE — H&P (Signed)
[facility] History and Physical                                                                                    Miguel [name], is a 64 y.o. male  MRN: [id number]   DOB - Sep 26, 1950  Admit Date - 09/07/2014  Outpatient Primary MD for the patient is Miguel German, MD  With History of -  Past Medical History  Diagnosis Date  . Hx of ventricular shunt   . Seizures   . Hypertension   . Stroke       History reviewed. No pertinent past surgical history.  in for   Chief Complaint  Patient presents with  . Leg Pain     HPI  Miguel [name]  is a 64 y.o. male, with a history of seizures and ventriculoperitoneal shunt who can't afford his phenytoin, stroke, and hypertension.  He is also homeless.  He was brought in via police after he was [place] a bridge.  He says that's not where he normally stays but he wanted to get out from the wind. He was hypothermic upon arrival (core temperature 92.70F) but was rewarmed via [employer].  During his exam today, he was able to answer questions when given enough time, but he was lethargic and only oriented to self.  Attempts were made to contact his girlfriend to determine his baseline, but she did not answer.  He denies fever, cough, chest pain, abdominal pain, and pain on urination.   CT head does not show any acute changes, however labs and vitals meet criteria for sepsis.    Review of Systems   In addition to the HPI above,  No Fever No Headache, No changes with Vision or hearing, No problems swallowing food or Liquids, No Chest pain, Cough or Shortness of Breath, No Abdominal pain, No Nausea or Vomiting, Bowel movements are regular, No Blood in stool or Urine, No dysuria, No new skin rashes or bruises, No new weakness in any extremity, A full 10 point Review of Systems was done, except as stated above, all other Review of Systems were negative.  Social History History  Substance Use Topics  . Smoking status: Current Every Day  Smoker  . Smokeless tobacco: Not on file  . Alcohol Use: Yes  Smokes 5 cigarettes per day.  Last alcohol use was 3 weeks ago & he drinks "hard liquor"  Family History No family history on file. Father is still living.  Patient is unable to give a family history (does not recall) Prior to Admission medications   Not on File    No Known Allergies  Physical Exam  Vitals  Blood pressure 143/83, pulse 77, temperature 98.3 F (36.8 C), temperature source Oral, resp. rate 10, SpO2 96 %.   General:  lying in bed in NAD, lightly dozing, able to respond to questions when given enough time, falls asleep occasionally - lethargic  Psych:  Awake, Alert when answering questions, Oriented to person only  Neuro:   No F.N deficits, generalized weakness, Strength 5/5 all 4 extremities.  Able to follow instruction.  ENT:  Conjunctivae clear, PER. EOMs intact. Moist oral mucosa without  erythema or exudates.  Neck:  Supple, No lymphadenopathy appreciated  Respiratory:  Coarse, Crackles at bases bilaterally.  Cardiac:  RRR, No Murmurs, no LE edema noted, no JVD.    Abdomen:  Positive bowel sounds, Soft, Non tender, Non distended,  No masses appreciated  Skin:  No Cyanosis, Normal Skin Turgor, scars on his stomach and left side of chest.  No ulcers on feet.  Extremities:  Able to move all 4. 5/5 strength in each,  no effusions.  Data Review  CBC  Recent Labs Lab 09/07/14 0522  WBC 12.0*  HGB 13.1  HCT 40.6  PLT 161  MCV 92.7  MCH 29.9  MCHC 32.3  RDW 14.3  LYMPHSABS 1.5  MONOABS 1.0  EOSABS 0.0  BASOSABS 0.0    Chemistries   Recent Labs Lab 09/07/14 0522  NA 141  K 3.9  CL 110  CO2 17*  GLUCOSE 74  BUN 25*  CREATININE 2.21*  CALCIUM 9.4  AST 28  ALT 17  ALKPHOS 50  BILITOT 0.8   Urinalysis    Component Value Date/Time   COLORURINE YELLOW 07/16/2014 0113   APPEARANCEUR CLEAR 07/16/2014 0113   LABSPEC 1.013 07/16/2014 0113   PHURINE 5.0 07/16/2014 0113    GLUCOSEU NEGATIVE 07/16/2014 0113   HGBUR NEGATIVE 07/16/2014 0113   BILIRUBINUR NEGATIVE 07/16/2014 0113   KETONESUR NEGATIVE 07/16/2014 0113   PROTEINUR NEGATIVE 07/16/2014 0113   UROBILINOGEN 0.2 07/16/2014 0113   NITRITE NEGATIVE 07/16/2014 0113   LEUKOCYTESUR NEGATIVE 07/16/2014 0113    Imaging results:   Dg Pelvis 1-2 Views  09/07/2014   CLINICAL DATA:  Left hip pain while walking.  Initial encounter.  EXAM: PELVIS - 1-2 VIEW  COMPARISON:  Abdominal radiograph from 02/24/2013  FINDINGS: There is no evidence of fracture or dislocation. Both femoral heads are seated normally within their respective acetabula. No significant degenerative change is appreciated. The sacroiliac joints are unremarkable in appearance.  The visualized bowel gas pattern is grossly unremarkable in appearance. A shunt is noted overlying the right hemipelvis. An IVC filter is noted.  IMPRESSION: No evidence of fracture or dislocation.   Electronically Signed   By: Roanna RaiderJeffery  Chang M.D.   On: 09/07/2014 06:20   Ct Head Wo Contrast  09/07/2014   CLINICAL DATA:  64 year old male with altered mental status, possible fever  EXAM: CT HEAD WITHOUT CONTRAST  TECHNIQUE: Contiguous axial images were obtained from the base of the skull through the vertex without intravenous contrast.  COMPARISON:  Prior head CT 07/16/2014  FINDINGS: Negative for acute intracranial hemorrhage, acute infarction, mass, mass effect, hydrocephalus or midline shift. Gray-white differentiation is preserved throughout. Stable appearance of the brain with focal encephalomalacia in the bilateral frontal and inferior anterior temporal lobes. Dystrophic calcifications are present in the encephalomalacia kicked right frontal lobe. Right parietal approach ventriculoperitoneal shunt catheter. The tip is again noted in the region of the foramen of Monro. Stable ventricular configuration.  No focal soft tissue or calvarial abnormality. Partial opacification of the  ethmoid air cells.  IMPRESSION: 1. No acute intracranial abnormality. 2. Unchanged appearance of the brain with extensive bilateral frontal and temporal encephalomalacia. Query past history of traumatic brain injury? 3. Right parietal approach ventriculoperitoneal shunt catheter remains in unchanged position. Stable configuration of the ventricles. 4. Inflammatory paranasal sinus disease with partial opacification of scattered ethmoid air cells.   Electronically Signed   By: Malachy MoanHeath  McCullough M.D.   On: 09/07/2014 12:48    My personal review of EKG:  Pending   Assessment & Plan  Principal Problem:   Sepsis Active Problems:   Seizures   Tobacco abuse   Hypothermia   Acute renal failure (ARF)   Metabolic acidosis   Altered mental status   Homelessness   Dilantin level too low   Leukocytosis   S/P ventriculoperitoneal shunt  Sepsis Uncertain etiology.  Temp 92, WBC 12.0, Resp 24 CXR, UA, blood cultures pending.  Lungs sound coarse on exam.  Patient likely has Asp pna vs CAP. Sepsis order set filled out.  Will start azith and rocephin. 6:39 pm - addendum.  Follow up vitals, labs, and cxr appear improved.  Will d/c antibiotics. Doubt Sepsis at this point.  Likely simply hypothermia.  Hypothermia Resolved with Bair Hugger and IV fluids AMS - checking UA, head CT negative, checking ammonia and ABG  ARF with metabolic acidosis Checking CK looking for possible rhabdo Giving IV fluids.  AM bmet.  Hx of seizures Checking dilantin level Cannot afford meds therefore not taking them Needs case manager consultation for medications.  Homeless consulted social work  Leukocytosis Granulocytes high, likely bacterial infection Awaiting CXR, UA, and blood cultures No abx started at this time  Tobacco abuse Smokes 5 cigarettes/day Politely refused nicotine patch  Hx of ventriculoperitoneal shunt In place on head CT  DVT Prophylaxis: heparin SQ  AM Labs Ordered, also please review  Full Orders  Family Communication: no family at bedside, father lives in [place] but pt can't remember his whole phone number, attempts were made to contact girlfriend (phone # in chart)     Code Status:  Full  Condition:  Guarded  Time spent in minutes : 60  Caroline E. Howell-Methvin, PA-S [place],  PA-C on 09/07/2014 at 2:21 PM  Between 7am to 7pm - Pager - [phone]  After 7pm go to [email or web address] - password [email or web address]  And look for the night coverage person covering me after hours  [facility]

## 2014-09-07 NOTE — Discharge Instructions (Signed)
Hypothermia  Hypothermia is low body temperature. The commonly accepted average normal body temperature is 98.6° F (37° C). When the body temperature drops below 95° F (35° C), it is a medical emergency.  CAUSES   Hypothermia usually occurs when a person is exposed to low temperatures. Such exposures frequently occur when a person does not have proper shelter or clothing. This can occur when a person is stranded outside, homeless, does not have heat in his or her home, or is immersed in cold water. People with impaired mobility, babies, and the elderly are particularly susceptible to becoming hypothermic. Sometimes the body's temperature control can be altered by disease. Certain medical conditions increase a person's risk of developing hypothermia, including:  · Burns.  · Lasting (chronic) skin disease (psoriasis).  · Head injuries.  · Strokes.  · Poisonings.  · Parkinson's disease.  · Brain tumors.  · Multiple sclerosis.  · Inflamed pancreas (pancreatitis).  · Overwhelming infections.  · Uremia.  · Alcoholism.  · Substance abuse.  · Mental illness.  · Certain medicines.  SYMPTOMS   · Drowsiness.  · Confusion.  · Slowed thinking and speech.  · Slow, shallow breathing.  · Slow, weak pulse.  · Shivering (early in hypothermia) or no shivering (late in hypothermia).  · Uncoordinated movements.  · Slowed response time.  · Numbness.  DIAGNOSIS   Hypothermia is usually suspected when someone has been exposed to cold. It is verified by taking the person's temperature with a thermometer. The most accurate measurement is a rectal temperature.  TREATMENT   Treatment begins with first aid on the scene. If hypothermia is suspected, treatment should be gentle. Vigorous handling can cause the heart to stop. Attempt first aid until it is possible to transport the person to a medical facility for further help. First aid on the scene includes:  · Moving the person to a warmer location. If you cannot get the person inside, protect  him or her from the wind. Lay him or her on a cloth, blanket, sleeping bag, or even cardboard, so that he or she is not in contact with the cold ground.  · Taking off the person's wet clothes. Wrap him or her in warm, dry material, such as blankets, coats, towels, sleeping bags, or clothing. If you cannot get the person to medical help right away, lie down close to the person. Sharing body heat, especially skin-to-skin contact, can help raise his or her body temperature.  · Giving the person a warm drink. Do not give alcohol or caffeine.  · Using first-aid warm compresses to provide heat to the neck, chest, or groin. Do not apply the compress to the arms or legs, as this can cause a fatal heart attack.  Never use a heat lamp, heating pad, or hot water to apply heat directly to a person who is hypothermic. This can injure his or her skin or cause a heart attack.  Once the person is taken to a medical facility, a variety of treatments can be done, including:  · Giving warm fluids by intravenous (IV) access.  · Giving warm, humidified oxygen through nasal tubing (nasal cannula), an oxygen mask or a breathing tube (endotracheal tube).  · Rewarming the blood by removing it, passing it through a hemodialysis machine, and returning it to the body at gradually increasing temperatures.  · Inserting a tiny tube into the stomach, bladder, or intestine, and instilling a warm saline solution directly into the body cavity. This is called   from the head and hands, so always wear a hat and gloves in the cold. Find out whether any of your medicines may put you at risk for hypothermia. Do not drink alcohol if you will be in the cold. Alcohol causes your blood vessels to get larger (dilate), making you lose body  heat. SEEK IMMEDIATE MEDICAL CARE IF:   You start to shiver.  Your teeth chatter.  You become drowsy.  You become confused or have slowed thinking and speech.  You develop slow, shallow breathing.  You develop a slow, weak pulse.  You have decreased coordination.  Your response time is slow. Document Released: 01/02/2010 Document Revised: 10/07/2011 Document Reviewed: 01/02/2010 [facility] Patient Information 2015 [place], [place]. This information is not intended to replace advice given to you by your health care provider. Make sure you discuss any questions you have with your health care provider.  Hip Pain Your hip is the joint between your upper legs and your lower pelvis. The bones, cartilage, tendons, and muscles of your hip joint perform a lot of work each day supporting your body weight and allowing you to move around. Hip pain can range from a minor ache to severe pain in one or both of your hips. Pain may be felt on the inside of the hip joint near the groin, or the outside near the buttocks and upper thigh. You may have swelling or stiffness as well.  HOME CARE INSTRUCTIONS   Take medicines only as directed by your health care provider.  Apply ice to the injured area:  Put ice in a plastic bag.  Place a towel between your skin and the bag.  Leave the ice on for 15-20 minutes at a time, 3-4 times a day.  Keep your leg raised (elevated) when possible to lessen swelling.  Avoid activities that cause pain.  Follow specific exercises as directed by your health care provider.  Sleep with a pillow between your legs on your most comfortable side.  Record how often you have hip pain, the location of the pain, and what it feels like. SEEK MEDICAL CARE IF:   You are unable to put weight on your leg.  Your hip is red or swollen or very tender to touch.  Your pain or swelling continues or worsens after 1 week.  You have increasing difficulty walking.  You have a  fever. SEEK IMMEDIATE MEDICAL CARE IF:   You have fallen.  You have a sudden increase in pain and swelling in your hip. MAKE SURE YOU:   Understand these instructions.  Will watch your condition.  Will get help right away if you are not doing well or get worse. Document Released: 01/02/2010 Document Revised: 11/29/2013 Document Reviewed: 03/11/2013 [facility] Patient Information 2015 [place], [place]. This information is not intended to replace advice given to you by your health care provider. Make sure you discuss any questions you have with your health care provider.

## 2014-09-07 NOTE — ED Provider Notes (Signed)
CSN: [id number]     Arrival date & time    History   First MD Initiated Contact with Patient 09/07/14 [phone]     Chief Complaint  Patient presents with  . Leg Pain     (Consider location/radiation/quality/duration/timing/severity/associated sxs/prior Treatment) HPI Comments: Patient is a 64 year old male with history of seizure disorder and VP shunt. He was brought for evaluation after being found [place] a bridge underneath cardboard boxes. He stated he had been there all night. His history is somewhat vague, however it appears as though he is homeless. He is shivering and feels cold [place]  Patient is a 64 y.o. male presenting with leg pain. The history is provided by the patient.  Leg Pain Location:  Hip Injury: no   Hip location:  R hip Pain details:    Quality:  Sharp   Radiates to:  Does not radiate   Severity:  Moderate   Timing:  Constant Chronicity:  New   Past Medical History  Diagnosis Date  . Hx of ventricular shunt   . Seizures   . Hypertension   . Stroke    History reviewed. No pertinent past surgical history. No family history on file. History  Substance Use Topics  . Smoking status: Current Every Day Smoker  . Smokeless tobacco: Not on file  . Alcohol Use: Yes    Review of Systems  All other systems reviewed and are negative.     Allergies  Review of patient's allergies indicates no known allergies.  Home Medications   Prior to Admission medications   Not on File   BP 124/95 mmHg  Pulse 88  Temp(Src) 92.8 F (33.8 C) (Rectal)  Resp 24 Physical Exam  Constitutional: He is oriented to person, place, and time.  Patient is a 64 year old male who is somewhat disheveled and unkempt. He is shivering. He is awake, alert, and responds appropriately to questions.  HENT:  Head: Normocephalic and atraumatic.  Mouth/Throat: Oropharynx is clear and moist.  Eyes: EOM are normal. Pupils are equal, round, and reactive to  light.  Neck: Normal range of motion. Neck supple.  Cardiovascular: Normal rate, regular rhythm and normal heart sounds.   No murmur heard. Pulmonary/Chest: Effort normal and breath sounds normal. No respiratory distress. He has no wheezes.  Abdominal: Soft. Bowel sounds are normal. He exhibits no distension. There is no tenderness.  Musculoskeletal: Normal range of motion. He exhibits no edema.  There is mild tenderness to palpation over the right lateral hip, however he has good range of motion and is neurovascularly intact. His lower extremities are somewhat cool to the touch.  Lymphadenopathy:    He has no cervical adenopathy.  Neurological: He is alert and oriented to person, place, and time.  Skin: Skin is warm and dry.  Nursing note and vitals reviewed.   ED Course  Procedures (including critical care time) Labs Review Labs Reviewed  COMPREHENSIVE METABOLIC PANEL  CBC WITH DIFFERENTIAL/PLATELET  ETHANOL    Imaging Review No results found.   EKG Interpretation None      MDM   Final diagnoses:  None    Patient is a 64 year old homeless male brought for evaluation of hip pain and cold exposure. He was apparently found [place] bridge covered in multiple boxes by police. He was then brought here for evaluation. His hip appears grossly normal with no significant swelling. X-rays do not reveal an acute fracture.  He was initially hypothermic upon  arrival with a temperature of 92. He is given warmed IV fluids and the bear hugger.    He will be observed for an additional period of time. If he is feeling better, he will be discharged. Care will be signed out to Dr. Blinda Leatherwood at shift change.      Geoffery Lyons, MD 09/08/14 [phone]

## 2014-09-08 DIAGNOSIS — Z590 Homelessness: Secondary | ICD-10-CM

## 2014-09-08 DIAGNOSIS — N179 Acute kidney failure, unspecified: Secondary | ICD-10-CM

## 2014-09-08 DIAGNOSIS — R7889 Finding of other specified substances, not normally found in blood: Secondary | ICD-10-CM

## 2014-09-08 DIAGNOSIS — T68XXXA Hypothermia, initial encounter: Secondary | ICD-10-CM

## 2014-09-08 LAB — BASIC METABOLIC PANEL
Anion gap: 9 (ref 5–15)
BUN: 19 mg/dL (ref 6–23)
CO2: 22 mmol/L (ref 19–32)
Calcium: 8.5 mg/dL (ref 8.4–10.5)
Chloride: 112 mmol/L (ref 96–112)
Creatinine, Ser: 1.22 mg/dL (ref 0.50–1.35)
GFR calc Af Amer: 71 mL/min — ABNORMAL LOW (ref >90)
GFR calc non Af Amer: 61 mL/min — ABNORMAL LOW (ref >90)
Glucose, Bld: 111 mg/dL — ABNORMAL HIGH (ref 70–99)
Potassium: 4.2 mmol/L (ref 3.5–5.1)
Sodium: 143 mmol/L (ref 135–145)

## 2014-09-08 LAB — CBC
HCT: 38.4 % — ABNORMAL LOW (ref 39.0–52.0)
Hemoglobin: 12.4 g/dL — ABNORMAL LOW (ref 13.0–17.0)
MCH: 30.4 pg (ref 26.0–34.0)
MCHC: 32.3 g/dL (ref 30.0–36.0)
MCV: 94.1 fL (ref 78.0–100.0)
PLATELETS: 148 10*3/uL — AB (ref 150–400)
RBC: 4.08 MIL/uL — ABNORMAL LOW (ref 4.22–5.81)
RDW: 14.5 % (ref 11.5–15.5)
WBC: 5.8 10*3/uL (ref 4.0–10.5)

## 2014-09-08 LAB — LACTIC ACID, PLASMA: Lactic Acid, Venous: 0.8 mmol/L (ref 0.5–2.0)

## 2014-09-08 MED ORDER — PHENYTOIN SODIUM EXTENDED 100 MG PO CAPS
300.0000 mg | ORAL_CAPSULE | Freq: Every day | ORAL | Status: DC
  Administered 2014-09-08 – 2014-09-10 (×3): 300 mg via ORAL
  Filled 2014-09-08 (×4): qty 3

## 2014-09-08 MED ORDER — AZITHROMYCIN 500 MG PO TABS
500.0000 mg | ORAL_TABLET | Freq: Every day | ORAL | Status: DC
  Administered 2014-09-08 – 2014-09-10 (×3): 500 mg via ORAL
  Filled 2014-09-08 (×5): qty 1

## 2014-09-08 MED ORDER — LORAZEPAM 1 MG PO TABS: 1.0000 mg | ORAL_TABLET | Freq: Four times a day (QID) | ORAL | Status: AC | PRN

## 2014-09-08 MED ORDER — VITAMIN B-1 100 MG PO TABS
100.0000 mg | ORAL_TABLET | Freq: Every day | ORAL | Status: DC
  Administered 2014-09-08 – 2014-09-11 (×4): 100 mg via ORAL
  Filled 2014-09-08 (×4): qty 1

## 2014-09-08 MED ORDER — CEFTRIAXONE SODIUM IN DEXTROSE 20 MG/ML IV SOLN
1.0000 g | INTRAVENOUS | Status: DC
  Administered 2014-09-08 – 2014-09-10 (×3): 1 g via INTRAVENOUS
  Filled 2014-09-08 (×4): qty 50

## 2014-09-08 MED ORDER — FOLIC ACID 1 MG PO TABS
1.0000 mg | ORAL_TABLET | Freq: Every day | ORAL | Status: DC
  Administered 2014-09-08 – 2014-09-11 (×4): 1 mg via ORAL
  Filled 2014-09-08 (×4): qty 1

## 2014-09-08 MED ORDER — LORAZEPAM 2 MG/ML IJ SOLN
1.0000 mg | Freq: Four times a day (QID) | INTRAMUSCULAR | Status: AC | PRN
  Administered 2014-09-09: 1 mg via INTRAVENOUS
  Filled 2014-09-08: qty 1

## 2014-09-08 MED ORDER — THIAMINE HCL 100 MG/ML IJ SOLN
100.0000 mg | Freq: Every day | INTRAMUSCULAR | Status: DC
  Filled 2014-09-08 (×3): qty 1

## 2014-09-08 NOTE — Progress Notes (Addendum)
[facility] PROGRESS NOTE  Miguel [name] ZOX:[id number] DOB: 1951/01/02 DOA: 09/07/2014 PCP: No PCP Per Patient  Brief Summary  Miguel [name] is a 64 y.o. male, with a history of seizures and ventriculoperitoneal shunt who can't afford his phenytoin, stroke, and hypertension. He is also homeless. He was brought in via police after he was [place] a bridge.He was hypothermic upon arrival (core temperature 92.44F) but was rewarmed via [employer]. He denied fever, cough, chest pain, abdominal pain, and pain on urination. CT head did not show any acute changes, however labs and vitals meet criteria for sepsis.   Assessment/Plan  Sepsis, uncertain etiology. Temp 92, WBC 12.0, Resp 24, elevated procalcitonin CXR, hyperinflation/COPD, no acute disease UA neg blood cultures pending.  Continue azith and rocephin day 2  Hypothermia, resolved with Bair Hugger and IV fluids  AMS - resolved.  Possibly secondary to hypothermia ammonia 21 ABG with normal pH Electrolytes normalized  ARF with metabolic acidosis, resolved.  Likely secondary to dehydration and hypothermia.  Repeat BMP in AM CK minimally elevated and likely not contributing to ARF Continue IV fluids. AM bmet.  Hx of seizures  dilantin level undetectable Resume dilantin Needs case manager consultation for medications.  Homeless consulted social work  Leukocytosis resolved.  May have underlying infection vs. dehydraton  Tobacco abuse Smokes 5 cigarettes/day Politely refused nicotine patch  Hx of ventriculoperitoneal shunt In place on head CT  Hx of CVA, stable neurologic exam per patient -  High falls risk due to homelessness and substance abuse.  Consider aspirin for secondary prevention  EtOH abuse - CIWA  Diet:  regular Access:  PIV IVF:  yes Proph:  heparin  Code Status: full Family Communication: patient alone Disposition Plan: pending improvement in kidney function, blood cultures negative  for at least 24 hours.  Possible discharge tomorrow if not showing signs of EtOH withdrawal  Consultants:  none  Procedures:  CT head  Antibiotics:  Ceftriaxone 2/10 >>  azithromcyin 2/10 >>   HPI/Subjective:  Feeling much better.  Had a chest cold for several days prior ot admission and continues to have some cough.  + sinus congestion, but denies sore throat.  - n/v/d  Objective: Filed Vitals:   09/07/14 1533 09/07/14 2206 09/08/14 0613 09/08/14 0759  BP: 158/101 128/78 149/70 127/62  Pulse:  70 79 69  Temp: 98.9 F (37.2 C) 99.5 F (37.5 C) 98.8 F (37.1 C) 99 F (37.2 C)  TempSrc: Oral Oral Oral Oral  Resp: Height:   6' (1.829 m)   Weight:   67.858 kg (149 lb 9.6 oz)   SpO2: 98% 96% 100% 100%    Intake/Output Summary (Last 24 hours) at 09/08/14 1045 Last data filed at 09/08/14 8119  Gross per 24 hour  Intake 2170.33 ml  Output   1460 ml  Net 710.33 ml   Filed Weights   09/08/14 1478  Weight: 67.858 kg (149 lb 9.6 oz)    Exam:   General:  Thin male, No acute distress  HEENT:  NCAT, MMM  Cardiovascular:  RRR, nl S1, S2 no mrg, 2+ pulses, warm extremities  Respiratory:  CTAB, no increased WOB  Abdomen:   NABS, soft, NT/ND  MSK:   Normal tone and bulk, no LEE  Neuro:  Grossly intact, mild left hemiparesis with mildly slurred speech (stroke several years ago left deficits)  Data Reviewed: Basic Metabolic Panel:  Recent Labs Lab 09/07/14 0522 09/07/14 1630 09/08/14 2956  NA 141  --  143  K 3.9  --  4.2  CL 110  --  112  CO2 17*  --  22  GLUCOSE 74  --  111*  BUN 25*  --  19  CREATININE 2.21* 1.42* 1.22  CALCIUM 9.4  --  8.5   Liver Function Tests:  Recent Labs Lab 09/07/14 0522  AST 28  ALT 17  ALKPHOS 50  BILITOT 0.8  PROT 7.8  ALBUMIN 4.4   No results for input(s): LIPASE, AMYLASE in the last 168 hours.  Recent Labs Lab 09/07/14 1409  AMMONIA 21   CBC:  Recent Labs Lab 09/07/14 0522 09/07/14 1630  09/08/14 0716  WBC 12.0* 7.9 5.8  NEUTROABS 9.5*  --   --   HGB 13.1 11.4* 12.4*  HCT 40.6 35.1* 38.4*  MCV 92.7 92.4 94.1  PLT 161 146* 148*   Cardiac Enzymes:  Recent Labs Lab 09/07/14 1409  CKTOTAL 544*   BNP (last 3 results) No results for input(s): BNP in the last 8760 hours.  ProBNP (last 3 results) No results for input(s): PROBNP in the last 8760 hours.  CBG: No results for input(s): GLUCAP in the last 168 hours.  Recent Results (from the past 240 hour(s))  Culture, blood (x 2)     Status: None (Preliminary result)   Collection Time: 09/07/14  4:40 PM  Result Value Ref Range Status   Specimen Description BLOOD LEFT ARM  Final   Special Requests BOTTLES DRAWN AEROBIC ONLY 7CC  Final   Culture   Final           BLOOD CULTURE RECEIVED NO GROWTH TO DATE CULTURE WILL BE HELD FOR 5 DAYS BEFORE ISSUING A FINAL NEGATIVE REPORT [facility]    Report Status PENDING  Incomplete  Culture, blood (x 2)     Status: None (Preliminary result)   Collection Time: 09/07/14  4:45 PM  Result Value Ref Range Status   Specimen Description BLOOD LEFT HAND  Final   Special Requests BOTTLES DRAWN AEROBIC ONLY 6CC  Final   Culture   Final           BLOOD CULTURE RECEIVED NO GROWTH TO DATE CULTURE WILL BE HELD FOR 5 DAYS BEFORE ISSUING A FINAL NEGATIVE REPORT [facility]formed at [facility]    Report Status PENDING  Incomplete     Studies: Dg Chest 2 View  09/07/2014   CLINICAL DATA:  Altered mental status. Cough, seizures. Acute renal failure.  EXAM: CHEST  2 VIEW  COMPARISON:  07/12/2013  FINDINGS: There is hyperinflation of the lungs compatible with COPD. Heart and mediastinal contours are within normal limits. No focal opacities or effusions. No acute bony abnormality. Ventriculoperitoneal shunt catheter projects over the right chest.  IMPRESSION: Hyperinflation/COPD.  No active disease.   Electronically Signed   By: Charlett NoseKevin  Dover M.D.   On: 09/07/2014 15:27    Dg Pelvis 1-2 Views  09/07/2014   CLINICAL DATA:  Left hip pain while walking.  Initial encounter.  EXAM: PELVIS - 1-2 VIEW  COMPARISON:  Abdominal radiograph from 02/24/2013  FINDINGS: There is no evidence of fracture or dislocation. Both femoral heads are seated normally within their respective acetabula. No significant degenerative change is appreciated. The sacroiliac joints are unremarkable in appearance.  The visualized bowel gas pattern is grossly unremarkable in appearance. A shunt is noted overlying the right hemipelvis. An IVC filter is noted.  IMPRESSION: No evidence of fracture or  dislocation.   Electronically Signed   By: Roanna Raider M.D.   On: 09/07/2014 06:20   Ct Head Wo Contrast  09/07/2014   CLINICAL DATA:  64 year old male with altered mental status, possible fever  EXAM: CT HEAD WITHOUT CONTRAST  TECHNIQUE: Contiguous axial images were obtained from the base of the skull through the vertex without intravenous contrast.  COMPARISON:  Prior head CT 07/16/2014  FINDINGS: Negative for acute intracranial hemorrhage, acute infarction, mass, mass effect, hydrocephalus or midline shift. Gray-white differentiation is preserved throughout. Stable appearance of the brain with focal encephalomalacia in the bilateral frontal and inferior anterior temporal lobes. Dystrophic calcifications are present in the encephalomalacia kicked right frontal lobe. Right parietal approach ventriculoperitoneal shunt catheter. The tip is again noted in the region of the foramen of Monro. Stable ventricular configuration.  No focal soft tissue or calvarial abnormality. Partial opacification of the ethmoid air cells.  IMPRESSION: 1. No acute intracranial abnormality. 2. Unchanged appearance of the brain with extensive bilateral frontal and temporal encephalomalacia. Query past history of traumatic brain injury? 3. Right parietal approach ventriculoperitoneal shunt catheter remains in unchanged position. Stable  configuration of the ventricles. 4. Inflammatory paranasal sinus disease with partial opacification of scattered ethmoid air cells.   Electronically Signed   By: Malachy Moan M.D.   On: 09/07/2014 12:48    Scheduled Meds: . antiseptic oral rinse  7 mL Mouth Rinse BID  . docusate sodium  100 mg Oral BID  . heparin  5,000 Units Subcutaneous 3 times per day  . multivitamin with minerals  1 tablet Oral Daily  . phenytoin  300 mg Oral QHS  . pneumococcal 23 valent vaccine  0.5 mL Intramuscular Tomorrow-1000  . sodium chloride  3 mL Intravenous Q12H   Continuous Infusions: . sodium chloride 100 mL/hr at 09/08/14 1610    Principal Problem:   Sepsis Active Problems:   Seizures   Tobacco abuse   Hypothermia   Acute renal failure (ARF)   Metabolic acidosis   Altered mental status   Homelessness   Dilantin level too low   Leukocytosis   S/P ventriculoperitoneal shunt    Time spent: 30 min    Nikeria Kalman, [facility]  [facility] Pager [phone]. If 7PM-7AM, please contact night-coverage at [email or web address], password [facility] 09/08/2014, 10:45 AM  LOS: 1 day

## 2014-09-08 NOTE — Evaluation (Signed)
Physical Therapy Evaluation Patient Details Name: Miguel [name] MRN: [id number] DOB: 06-11-1951 Today's Date: 09/08/2014   History of Present Illness  Miguel [name] is a 64 y.o. male, with a history of seizures and ventriculoperitoneal shunt who can't afford his phenytoin, stroke, and hypertension. He is also homeless. He was brought in via police after he was found [place] a bridge. He says that's not where he normally stays but he wanted to get out from the wind. He was hypothermic upon arrival (core temperature 92.75F) but was rewarmed via [employer]. During his exam today, he was able to answer questions when given enough time, but he was lethargic and only oriented to self. Attempts were made to contact his girlfriend to determine his baseline, but she did not answer. He denies fever, cough, chest pain, abdominal pain, and pain on urination. CT head does not show any acute changes, however labs and vitals meet criteria for sepsis.   Clinical Impression  Pt admitted with/for hypothermia.  Pt currently limited functionally due to the problems listed below.  (see problems list.)  Pt will benefit from PT to maximize function and safety to be able to get home safely with available assist of family/friends.     Follow Up Recommendations No PT follow up    Equipment Recommendations  None recommended by PT    Recommendations for Other Services       Precautions / Restrictions Restrictions Weight Bearing Restrictions: No      Mobility  Bed Mobility Overal bed mobility: Modified Independent                Transfers Overall transfer level: Needs assistance   Transfers: Sit to/from Stand Sit to Stand: Supervision         General transfer comment: safe transfer technique  Ambulation/Gait Ambulation/Gait assistance: Supervision Ambulation Distance (Feet): 230 Feet Assistive device: Straight cane (2 Canes) Gait Pattern/deviations: Step-through pattern Gait velocity:  moderate   General Gait Details: generally steady gait with 2 canes.  Pt able to scann environment and stay steady in turns.  Stairs Stairs:  (deferred, needed to go to bathroom)          Wheelchair Mobility    Modified Rankin (Stroke Patients Only)       Balance Overall balance assessment: Needs assistance Sitting-balance support: No upper extremity supported Sitting balance-Leahy Scale: Good     Standing balance support: No upper extremity supported Standing balance-Leahy Scale: Fair                               Pertinent Vitals/Pain Pain Assessment: No/denies pain    Home Living Family/patient expects to be discharged to:: Private residence Living Arrangements: Spouse/significant other Available Help at Discharge:  (states has sign. other, but don't know much more) Type of Home: House Home Access: Stairs to enter   [employer] of Steps: 2 Home Layout: One level Home Equipment: Cane - single point;Other (comment) (2 homemade cane used like hiking sticks)      Prior Function Level of Independence: Independent with assistive device(s)               Hand Dominance        Extremity/Trunk Assessment   Upper Extremity Assessment: Defer to OT evaluation           Lower Extremity Assessment: Overall WFL for tasks assessed (mild proximal weakness)      Cervical / Trunk Assessment:  Normal  Communication   Communication: No difficulties  Cognition Arousal/Alertness: Awake/alert Behavior During Therapy: WFL for tasks assessed/performed Overall Cognitive Status: Within Functional Limits for tasks assessed                      General Comments General comments (skin integrity, edema, etc.): Not sure that I got a complete history or true level of his PLOF    Exercises        Assessment/Plan    PT Assessment Patient needs continued PT services  PT Diagnosis Abnormality of gait;Other (comment) (decreased  activity tolerance)   PT Problem List Decreased strength;Decreased balance;Decreased activity tolerance  PT Treatment Interventions Stair training;Gait training;Functional mobility training;Therapeutic activities;Patient/family education   PT Goals (Current goals can be found in the Care Plan section) Acute Rehab PT Goals Patient Stated Goal: pt did not participate with goals PT Goal Formulation: With patient Time For Goal Achievement: 09/15/14 Potential to Achieve Goals: Good    Frequency Min 2X/week   Barriers to discharge        Co-evaluation               End of Session   Activity Tolerance: Patient tolerated treatment well Patient left: in bed;with call bell/phone within reach Nurse Communication: Mobility status         Time: 1002-1029 PT Time Calculation (min) (ACUTE ONLY): 27 min   Charges:   PT Evaluation $Initial PT Evaluation Tier I: 1 Procedure PT Treatments $Gait Training: 8-22 mins   PT G Codes:        Bergen Melle, Eliseo GumKenneth V 09/08/2014, 10:47 AM  09/08/2014  [place] BingKen Taygen Acklin, PT [phone]  (pager)

## 2014-09-08 NOTE — Evaluation (Signed)
Occupational Therapy Evaluation Patient Details Name: Miguel [name] MRN: [id number] DOB: [birth year] Today's Date: 09/08/2014    History of Present Illness Pt is a 64 y.o. Male with PMH of seizures, VP shunt, CVA, and HTN. Pt was found by police [place] a bridge and brought in for hypothermia with core temp of 92.72F. Pt presented with confusion, CT of head negative for acute changes. Labs and vitals met criteria for sepsis.    Clinical Impression   PTA pt reports independence with ADLs. Unclear PLOF or home environment as pt reports he lives with his girlfriend. Suspect that pt is at or near [facility] for ADLs and functional mobility and has no ADL needs. Acute OT to sign off.     Follow Up Recommendations  No OT follow up;Supervision - Intermittent    Equipment Recommendations  None recommended by OT    Recommendations for Other Services       Precautions / Restrictions Restrictions Weight Bearing Restrictions: No      Mobility Bed Mobility Overal bed mobility: Modified Independent                Transfers Overall transfer level: Needs assistance Equipment used:  (pt's walking sticks) Transfers: Sit to/from Stand Sit to Stand: Supervision         General transfer comment: safe transfer technique    Balance Overall balance assessment: Needs assistance Sitting-balance support: No upper extremity supported Sitting balance-Leahy Scale: Good     Standing balance support: No upper extremity supported Standing balance-Leahy Scale: Fair                              ADL Overall ADL's : Needs assistance/impaired Eating/Feeding: Independent;Sitting   Grooming: Min guard;Standing   Upper Body Bathing: Set up;Sitting   Lower Body Bathing: Min guard;Sit to/from stand   Upper Body Dressing : Set up;Sitting   Lower Body Dressing: Min guard;Sit to/from stand   Toilet Transfer: Min guard;Ambulation (walking sticks)   Toileting- Clothing Manipulation and  Hygiene: Min guard;Sit to/from stand       Functional mobility during ADLs: Min guard (walking sticks) General ADL Comments: Pt is likely at or near baseline for ADLs and functional mobility. He reports that he lives with his girlfriend. Pt was able to provide medical hx and was alert and oriented. Pt stood at sink with grooming task and noted mild LOB with self-correction during tasks.                Pertinent Vitals/Pain Pain Assessment: No/denies pain     Hand Dominance Left   Extremity/Trunk Assessment Upper Extremity Assessment Upper Extremity Assessment: RUE deficits/detail;Overall [facility] for tasks assessed RUE Deficits / Details: premorbid weakness in RUE from CVA, however able to use functionally with grip strength of 4/5   Lower Extremity Assessment Lower Extremity Assessment: Defer to PT evaluation   Cervical / Trunk Assessment Cervical / Trunk Assessment: Normal   Communication Communication Communication: No difficulties (pt states mild expressive difficulties since his CVA)   Cognition Arousal/Alertness: Awake/alert Behavior During Therapy: WFL for tasks assessed/performed Overall Cognitive Status: Within Functional Limits for tasks assessed                                Home Living Family/patient expects to be discharged to:: Unsure Living Arrangements: Spouse/significant other Available Help at Discharge:  (states has sign. other, but  don't know much more) Type of Home: House Home Access: Stairs to enter [employer] of Steps: 2   Home Layout: One level               Home Equipment: Cane - single point;Other (comment) (2 homemade cane used like hiking sticks)   Additional Comments: Pt reports that he lives with his girlfriend, however unclear whether this is the case. RN and staff have been unable to contact       Prior Functioning/Environment Level of Independence: Independent with assistive device(s)        Comments:  pt has two walking sticks that he made and uses as canes at all times.     OT Diagnosis: Generalized weakness;Altered mental status   OT Problem List:     OT Treatment/Interventions:      OT Goals(Current goals can be found in the care plan section) Acute Rehab OT Goals Patient Stated Goal: pt did not participate with goals  OT Frequency:      End of Session Equipment Utilized During Treatment: Other (comment) (walking sticks)  Activity Tolerance: Patient tolerated treatment well Patient left: in bed;with call bell/phone within reach   Time: 1035-1052 OT Time Calculation (min): 17 min Charges:  OT General Charges $OT Visit: 1 Procedure OT Evaluation $Initial OT Evaluation Tier I: 1 Procedure G-Codes:    Juluis Rainier 09/14/2014, 12:03 PM   Cyndie Chime, OTR/L Occupational Therapist [phone] (pager)
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
## 2014-09-09 DIAGNOSIS — T68XXXD Hypothermia, subsequent encounter: Secondary | ICD-10-CM

## 2014-09-09 DIAGNOSIS — A419 Sepsis, unspecified organism: Secondary | ICD-10-CM | POA: Diagnosis not present

## 2014-09-09 LAB — BASIC METABOLIC PANEL
ANION GAP: 7 (ref 5–15)
BUN: 13 mg/dL (ref 6–23)
CALCIUM: 9.3 mg/dL (ref 8.4–10.5)
CHLORIDE: 109 mmol/L (ref 96–112)
CO2: 22 mmol/L (ref 19–32)
Creatinine, Ser: 1.04 mg/dL (ref 0.50–1.35)
GFR, EST AFRICAN AMERICAN: 86 mL/min — AB (ref >90)
GFR, EST NON AFRICAN AMERICAN: 74 mL/min — AB (ref >90)
Glucose, Bld: 92 mg/dL (ref 70–99)
POTASSIUM: 4.2 mmol/L (ref 3.5–5.1)
SODIUM: 138 mmol/L (ref 135–145)

## 2014-09-09 LAB — HIV ANTIBODY (ROUTINE TESTING W REFLEX): HIV SCREEN 4TH GENERATION: NONREACTIVE

## 2014-09-09 LAB — CLOSTRIDIUM DIFFICILE BY PCR: CDIFFPCR: NEGATIVE

## 2014-09-09 LAB — CBC
HEMATOCRIT: 40.7 % (ref 39.0–52.0)
Hemoglobin: 13 g/dL (ref 13.0–17.0)
MCH: 30.1 pg (ref 26.0–34.0)
MCHC: 31.9 g/dL (ref 30.0–36.0)
MCV: 94.2 fL (ref 78.0–100.0)
Platelets: 160 10*3/uL (ref 150–400)
RBC: 4.32 MIL/uL (ref 4.22–5.81)
RDW: 14.2 % (ref 11.5–15.5)
WBC: 5.1 10*3/uL (ref 4.0–10.5)

## 2014-09-09 LAB — URINE CULTURE
COLONY COUNT: NO GROWTH
CULTURE: NO GROWTH

## 2014-09-09 MED ORDER — PHENYTOIN SODIUM EXTENDED 300 MG PO CAPS: 300.0000 mg | ORAL_CAPSULE | Freq: Every day | ORAL | Status: DC

## 2014-09-09 MED ORDER — AZITHROMYCIN 500 MG PO TABS: 500.0000 mg | ORAL_TABLET | Freq: Every day | ORAL | Status: DC

## 2014-09-09 NOTE — Progress Notes (Signed)
Medicare Important Message given? YES  (If response is "NO", the following Medicare IM given date fields will be blank)  Date Medicare IM given: 09/09/14 Medicare IM given by:  Adrielle Polakowski  

## 2014-09-09 NOTE — Discharge Summary (Addendum)
Physician Discharge Summary  Miguel [name] ZOX:[id number] DOB: [birth year] DOA: 09/07/2014  PCP: No PCP Per Patient  Admit date: 09/07/2014 Discharge date: 09/10/2014  Recommendations for Outpatient Follow-up:  1. [facility] wellness for prescription medication assistance.   2. Repeat BMP in about 1 week to confirm resolution of AKI and follow up on diarrhea  Discharge Diagnoses:  Principal Problem:   Sepsis Active Problems:   Seizures   Tobacco abuse   Hypothermia   Acute renal failure (ARF)   Metabolic acidosis   Altered mental status   Homelessness   Dilantin level too low   Leukocytosis   S/P ventriculoperitoneal shunt   Discharge Condition: stable, improved  Diet recommendation: regular  Wt Readings from Last 3 Encounters:  09/08/14 67.858 kg (149 lb 9.6 oz)  07/16/14 68.04 kg (150 lb)  01/20/14 67.132 kg (148 lb)    History of present illness:  Miguel [name] is a 64 y.o. male, with a history of seizures and ventriculoperitoneal shunt who can't afford his phenytoin, stroke, hypertension, and homelessness. He was brought in via police after he was [place] a bridge. He was hypothermic upon arrival (core temperature 92.51F) but was rewarmed via [employer]. He was lethargic and only oriented to self. He denied fever, cough, chest pain, abdominal pain, and pain on urination. CT head does not show any acute changes, however labs and vitals meet criteria for sepsis.   Hospital Course:   Hypothermia due to exposure from homelessness, resolved with Bair Hugger and IV fluids.  Although he had signs of sepsis, such has hypothermia, mildly elevated WBC and RR and procalcitonin, these were likely all secondary to hypothermia.   CXR, hyperinflation/COPD, no acute disease UA neg blood cultures NGTD.  He was started on empiric antibiotics for possible aspiration pneumonia and he did endorse a cough for a few days prior to admission, however, I have low suspicion of  pneumonia as he has remained symptom free since admission.  Antibiotics stopped at time of discharge.  AMS - resolved. probably secondary to hypothermia ammonia 21 ABG with normal pH Electrolytes normalized once patient was rewarmed and hydrated.  Diarrhea, none prior to admission, but occurred on day 2.  may have had some ileus from hypothermia vs. Colitis.  Resolved. -  C. Diff negative  ARF with metabolic acidosis, resolved. Likely secondary to dehydration and hypothermia. creatinine trended down from 2.21 to 1, which is the patient's baseline CK minimally elevated and likely not contributing to ARF  Hx of seizures  dilantin level undetectable Resumed dilantin Case manager set him up with community health and wellness so he can get assistance with his medications  Homeless consulted social work who gave information about local shelters Provided with bus pass  Leukocytosis resolved. likely due to dehydration, stress from hypothermia, or possible pneumonia.    Tobacco abuse Smokes 5 cigarettes/day Politely refused nicotine patch  Hx of ventriculoperitoneal shunt In place on head CT  Hx of CVA, stable neurologic exam per patient - High falls risk due to homelessness and substance abuse. Consider aspirin for secondary prevention if he is compliant with follow up appointments.  EtOH abuse - CIWA, no evidence of withdrawal during admission  Consultants:  none  Procedures:  CT head  Antibiotics:  Ceftriaxone 2/10 >>2/13  azithromcyin 2/10 >>2/13  Discharge Exam: Filed Vitals:   09/10/14 1221  BP: 145/84  Pulse:   Temp: 97.6 F (36.4 C)  Resp: 16   Filed Vitals:   09/09/14  2143 09/10/14 0021 09/10/14 0655 09/10/14 1221  BP: 149/79 152/77 145/96 145/84  Pulse: 63 107 65   Temp: 98.9 F (37.2 C) 98.6 F (37 C) 98.9 F (37.2 C) 97.6 F (36.4 C)  TempSrc: Oral Oral Oral Oral  Resp: Height:      Weight:      SpO2: 100% 98% 94% 95%    Feels well, no compliants  General: Thin male, No acute distress  HEENT: NCAT, MMM  Cardiovascular: RRR, nl S1, S2 no mrg, 2+ pulses, warm extremities  Respiratory: CTAB, no increased WOB  Abdomen: NABS, soft, NT/ND  MSK: Normal tone and bulk, no LEE  Neuro: Grossly intact, mild left hemiparesis with mildly slurred speech (stroke several years ago left deficits  Discharge Instructions      Discharge Instructions    Call MD for:  difficulty breathing, headache or visual disturbances    Complete by:  As directed      Call MD for:  extreme fatigue    Complete by:  As directed      Call MD for:  hives    Complete by:  As directed      Call MD for:  persistant dizziness or light-headedness    Complete by:  As directed      Call MD for:  persistant nausea and vomiting    Complete by:  As directed      Call MD for:  severe uncontrolled pain    Complete by:  As directed      Call MD for:  temperature >100.4    Complete by:  As directed      Diet general    Complete by:  As directed      Discharge instructions    Complete by:  As directed   Please make sure to stay warm.  It is supposed to remain at or below freezing for the next several days.  You can get your medications filled by taking your prescriptions to the community health and wellness clinic on Monday.     Increase activity slowly    Complete by:  As directed             Medication List    TAKE these medications        phenytoin 300 MG ER capsule  Commonly known as:  DILANTIN  Take 1 capsule (300 mg total) by mouth at bedtime.       Follow-up Information    Follow up with [facility]    . Schedule an appointment as soon as possible for a visit in 1 week.   Contact information:   [address] [place] [address] [phone]       The results of significant diagnostics from this hospitalization (including imaging, microbiology,  ancillary and laboratory) are listed below for reference.    Significant Diagnostic Studies: Dg Chest 2 View  09/07/2014   CLINICAL DATA:  Altered mental status. Cough, seizures. Acute renal failure.  EXAM: CHEST  2 VIEW  COMPARISON:  07/12/2013  FINDINGS: There is hyperinflation of the lungs compatible with COPD. Heart and mediastinal contours are within normal limits. No focal opacities or effusions. No acute bony abnormality. Ventriculoperitoneal shunt catheter projects over the right chest.  IMPRESSION: Hyperinflation/COPD.  No active disease.   Electronically Signed   By: Charlett Nose M.D.   On: 09/07/2014 15:27   Dg Pelvis 1-2 Views  09/07/2014  CLINICAL DATA:  Left hip pain while walking.  Initial encounter.  EXAM: PELVIS - 1-2 VIEW  COMPARISON:  Abdominal radiograph from 02/24/2013  FINDINGS: There is no evidence of fracture or dislocation. Both femoral heads are seated normally within their respective acetabula. No significant degenerative change is appreciated. The sacroiliac joints are unremarkable in appearance.  The visualized bowel gas pattern is grossly unremarkable in appearance. A shunt is noted overlying the right hemipelvis. An IVC filter is noted.  IMPRESSION: No evidence of fracture or dislocation.   Electronically Signed   By: Roanna Raider M.D.   On: 09/07/2014 06:20   Ct Head Wo Contrast  09/07/2014   CLINICAL DATA:  64 year old male with altered mental status, possible fever  EXAM: CT HEAD WITHOUT CONTRAST  TECHNIQUE: Contiguous axial images were obtained from the base of the skull through the vertex without intravenous contrast.  COMPARISON:  Prior head CT 07/16/2014  FINDINGS: Negative for acute intracranial hemorrhage, acute infarction, mass, mass effect, hydrocephalus or midline shift. Gray-white differentiation is preserved throughout. Stable appearance of the brain with focal encephalomalacia in the bilateral frontal and inferior anterior temporal lobes. Dystrophic  calcifications are present in the encephalomalacia kicked right frontal lobe. Right parietal approach ventriculoperitoneal shunt catheter. The tip is again noted in the region of the foramen of Monro. Stable ventricular configuration.  No focal soft tissue or calvarial abnormality. Partial opacification of the ethmoid air cells.  IMPRESSION: 1. No acute intracranial abnormality. 2. Unchanged appearance of the brain with extensive bilateral frontal and temporal encephalomalacia. Query past history of traumatic brain injury? 3. Right parietal approach ventriculoperitoneal shunt catheter remains in unchanged position. Stable configuration of the ventricles. 4. Inflammatory paranasal sinus disease with partial opacification of scattered ethmoid air cells.   Electronically Signed   By: Malachy Moan M.D.   On: 09/07/2014 12:48    Microbiology: Recent Results (from the past 240 hour(s))  Culture, blood (x 2)     Status: None (Preliminary result)   Collection Time: 09/07/14  4:40 PM  Result Value Ref Range Status   Specimen Description BLOOD LEFT ARM  Final   Special Requests BOTTLES DRAWN AEROBIC ONLY 7CC  Final   Culture   Final           BLOOD CULTURE RECEIVED NO GROWTH TO DATE CULTURE WILL BE HELD FOR 5 DAYS BEFORE ISSUING A FINAL NEGATIVE REPORT Performed at [facility]    Report Status PENDING  Incomplete  Culture, blood (x 2)     Status: None (Preliminary result)   Collection Time: 09/07/14  4:45 PM  Result Value Ref Range Status   Specimen Description BLOOD LEFT HAND  Final   Special Requests BOTTLES DRAWN AEROBIC ONLY 6CC  Final   Culture   Final           BLOOD CULTURE RECEIVED NO GROWTH TO DATE CULTURE WILL BE HELD FOR 5 DAYS BEFORE ISSUING A FINAL NEGATIVE REPORT Performed at [facility]    Report Status PENDING  Incomplete  Urine culture     Status: None   Collection Time: 09/07/14  5:47 PM  Result Value Ref Range Status   Specimen Description URINE, CLEAN  CATCH  Final   Special Requests NONE  Final   Colony Count NO GROWTH Performed at [facility]   Final   Culture NO GROWTH Performed at [facility]   Final   Report Status 09/09/2014 FINAL  Final  Clostridium Difficile by PCR  Status: None   Collection Time: 09/09/14 12:50 AM  Result Value Ref Range Status   C difficile by pcr NEGATIVE NEGATIVE Final     Labs: Basic Metabolic Panel:  Recent Labs Lab 09/07/14 0522 09/07/14 1630 09/08/14 0716 09/09/14 0545 09/10/14 0500  NA 141  --  143 138 140  K 3.9  --  4.2 4.2 4.3  CL 110  --  112 109 106  CO2 17*  --  22 22 25   GLUCOSE 74  --  111* 92 89  BUN 25*  --  19 13 14   CREATININE 2.21* 1.42* 1.22 1.04 1.13  CALCIUM 9.4  --  8.5 9.3 9.4   Liver Function Tests:  Recent Labs Lab 09/07/14 0522  AST 28  ALT 17  ALKPHOS 50  BILITOT 0.8  PROT 7.8  ALBUMIN 4.4   No results for input(s): LIPASE, AMYLASE in the last 168 hours.  Recent Labs Lab 09/07/14 1409  AMMONIA 21   CBC:  Recent Labs Lab 09/07/14 0522 09/07/14 1630 09/08/14 0716 09/09/14 0545 09/10/14 0500  WBC 12.0* 7.9 5.8 5.1 6.4  NEUTROABS 9.5*  --   --   --   --   HGB 13.1 11.4* 12.4* 13.0 15.6  HCT 40.6 35.1* 38.4* 40.7 47.6  MCV 92.7 92.4 94.1 94.2 94.1  PLT 161 146* 148* 160 184   Cardiac Enzymes:  Recent Labs Lab 09/07/14 1409  CKTOTAL 544*   BNP: BNP (last 3 results) No results for input(s): BNP in the last 8760 hours.  ProBNP (last 3 results) No results for input(s): PROBNP in the last 8760 hours.  CBG: No results for input(s): GLUCAP in the last 168 hours.  Time coordinating discharge: 35 minutes  Signed:  Semone Orlov  [facility] 09/10/2014, 1:12 PM

## 2014-09-09 NOTE — Care Management Note (Unsigned)
    Page 1 of 1   09/09/2014     6:21:40 PM CARE MANAGEMENT NOTE 09/09/2014  Patient:  Miguel [name],Miguel [name]   Account Number:  [id number]  Date Initiated:  09/09/2014  Documentation initiated by:  Letha CapeAYLOR,Laurelyn Terrero  Subjective/Objective Assessment:   dx diarrhea , poss cdiff  admit- homeless     Action/Plan:   pt eval- no f/u/   Anticipated DC Date:  09/10/2014   Anticipated DC Plan:  HOMELESS SHELTER  In-house referral  Clinical Social Worker      DC Planning Services  CM consult      Choice offered to / List presented to:             Status of service:  In process, will continue to follow Medicare Important Message given?  YES (If response is "NO", the following Medicare IM given date fields will be blank) Date Medicare IM given:  09/09/2014 Medicare IM given by:  Donn PieriniWEBSTER,KRISTI Date Additional Medicare IM given:   Additional Medicare IM given by:    Discharge Disposition:    Per UR Regulation:  Reviewed for med. necessity/level of care/duration of stay  If discussed at Long Length of Stay Meetings, dates discussed:    Comments:  09/09/14 1814 Letha Capeeborah Telly Broberg RN, BSN [phone] patient is homeless , CSW referral, patient may need med ast, will leave information for weekend NCM.

## 2014-09-09 NOTE — Progress Notes (Signed)
[facility] PROGRESS NOTE  Miguel [name] ZOX:[id number] DOB: 1950-11-30 DOA: 09/07/2014 PCP: No PCP Per Patient  Brief Summary  Miguel [name] is a 64 y.o. male, with a history of seizures and ventriculoperitoneal shunt who can't afford his phenytoin, stroke, and hypertension. He is also homeless. He was brought in via police after he was found [place] a bridge.He was hypothermic upon arrival (core temperature 92.59F) but was rewarmed via [employer]. He denied fever, cough, chest pain, abdominal pain, and pain on urination. CT head did not show any acute changes, however labs and vitals meet criteria for sepsis.   Assessment/Plan  Sepsis, uncertain etiology. Temp 92, WBC 12.0, Resp 24, elevated procalcitonin, now having diarrhea CXR, hyperinflation/COPD, no acute disease UA neg blood cultures NGTD Continue azith and rocephin day 3 C. Diff PCR negative Will discharge once diarrhea is improving.  Do not feel comfortable discharging homeless patient into < 50F weather with ongoing diarrhea.    Hypothermia, resolved with Bair Hugger and IV fluids  AMS - resolved.  Possibly secondary to hypothermia ammonia 21 ABG with normal pH Electrolytes normalized  ARF with metabolic acidosis, resolved.  Likely secondary to dehydration and hypothermia.  Resolved. CK minimally elevated and likely not contributing to ARF Continue IV fluids. AM bmet.  Hx of seizures  dilantin level undetectable Resumed dilantin [employer] [facility]  Homeless consulted social work  Leukocytosis resolved.  May have underlying infection vs. dehydraton  Tobacco abuse Smokes 5 cigarettes/day Politely refused nicotine patch  Hx of ventriculoperitoneal shunt In place on head CT  Hx of CVA, stable neurologic exam per patient -  High falls risk due to homelessness and substance abuse.  Consider aspirin for secondary prevention  EtOH abuse - CIWA  Diet:  regular Access:  PIV IVF:   yes Proph:  heparin  Code Status: full Family Communication: patient alone Disposition Plan:  Later today if diarrhea resolving, otherwise will reevaluate tomorrow  Consultants:  none  Procedures:  CT head  Antibiotics:  Ceftriaxone 2/10 >>  azithromcyin 2/10 >>   HPI/Subjective:  Has had 2 watery brown BMs.  Denies abdominal pain, nausea, vomitring  Objective: Filed Vitals:   09/09/14 0035 09/09/14 0052 09/09/14 0634 09/09/14 0831  BP: 190/79 169/91 155/85 158/82  Pulse: 64  48 58  Temp: 98.6 F (37 C)  98.1 F (36.7 C) 97.6 F (36.4 C)  TempSrc: Oral  Oral Oral  Resp: Height:      Weight:      SpO2: 99%  100% 100%    Intake/Output Summary (Last 24 hours) at 09/09/14 1152 Last data filed at 09/09/14 1005  Gross per 24 hour  Intake   1190 ml  Output   3150 ml  Net  -1960 ml   Filed Weights   09/08/14 8119  Weight: 67.858 kg (149 lb 9.6 oz)    Exam:   General:  Thin male, No acute distress  HEENT:  NCAT, MMM  Cardiovascular:  RRR, nl S1, S2 no mrg, 2+ pulses, warm extremities  Respiratory:  CTAB, no increased WOB  Abdomen:   Hyperactive BS, soft, NT/ND  MSK:   Normal tone and bulk, no LEE  Neuro:  Grossly intact, mild left hemiparesis with mildly slurred speech (stroke several years ago left deficits)  Data Reviewed: Basic Metabolic Panel:  Recent Labs Lab 09/07/14 0522 09/07/14 1630 09/08/14 0716 09/09/14 0545  NA 141  --  143 138  K 3.9  --  4.2 4.2  CL 110  --  112 109  CO2 17*  --  22 22  GLUCOSE 74  --  111* 92  BUN 25*  --  19 13  CREATININE 2.21* 1.42* 1.22 1.04  CALCIUM 9.4  --  8.5 9.3   Liver Function Tests:  Recent Labs Lab 09/07/14 0522  AST 28  ALT 17  ALKPHOS 50  BILITOT 0.8  PROT 7.8  ALBUMIN 4.4   No results for input(s): LIPASE, AMYLASE in the last 168 hours.  Recent Labs Lab 09/07/14 1409  AMMONIA 21   CBC:  Recent Labs Lab 09/07/14 0522 09/07/14 1630 09/08/14 0716  09/09/14 0545  WBC 12.0* 7.9 5.8 5.1  NEUTROABS 9.5*  --   --   --   HGB 13.1 11.4* 12.4* 13.0  HCT 40.6 35.1* 38.4* 40.7  MCV 92.7 92.4 94.1 94.2  PLT 161 146* 148* 160   Cardiac Enzymes:  Recent Labs Lab 09/07/14 1409  CKTOTAL 544*   BNP (last 3 results) No results for input(s): BNP in the last 8760 hours.  ProBNP (last 3 results) No results for input(s): PROBNP in the last 8760 hours.  CBG: No results for input(s): GLUCAP in the last 168 hours.  Recent Results (from the past 240 hour(s))  Culture, blood (x 2)     Status: None (Preliminary result)   Collection Time: 09/07/14  4:40 PM  Result Value Ref Range Status   Specimen Description BLOOD LEFT ARM  Final   Special Requests BOTTLES DRAWN AEROBIC ONLY 7CC  Final   Culture   Final           BLOOD CULTURE RECEIVED NO GROWTH TO DATE CULTURE WILL BE HELD FOR 5 DAYS BEFORE ISSUING A FINAL NEGATIVE REPORT Performed at [facility]    Report Status PENDING  Incomplete  Culture, blood (x 2)     Status: None (Preliminary result)   Collection Time: 09/07/14  4:45 PM  Result Value Ref Range Status   Specimen Description BLOOD LEFT HAND  Final   Special Requests BOTTLES DRAWN AEROBIC ONLY 6CC  Final   Culture   Final           BLOOD CULTURE RECEIVED NO GROWTH TO DATE CULTURE WILL BE HELD FOR 5 DAYS BEFORE ISSUING A FINAL NEGATIVE REPORT Performed at [facility]    Report Status PENDING  Incomplete  Urine culture     Status: None   Collection Time: 09/07/14  5:47 PM  Result Value Ref Range Status   Specimen Description URINE, CLEAN CATCH  Final   Special Requests NONE  Final   Colony Count NO GROWTH Performed at [facility]   Final   Culture NO GROWTH Performed at [facility]   Final   Report Status 09/09/2014 FINAL  Final  Clostridium Difficile by PCR     Status: None   Collection Time: 09/09/14 12:50 AM  Result Value Ref Range Status   C difficile by pcr NEGATIVE NEGATIVE Final      Studies: Dg Chest 2 View  09/07/2014   CLINICAL DATA:  Altered mental status. Cough, seizures. Acute renal failure.  EXAM: CHEST  2 VIEW  COMPARISON:  07/12/2013  FINDINGS: There is hyperinflation of the lungs compatible with COPD. Heart and mediastinal contours are within normal limits. No focal opacities or effusions. No acute bony abnormality. Ventriculoperitoneal shunt catheter projects over the right chest.  IMPRESSION: Hyperinflation/COPD.  No active disease.  Electronically Signed   By: Charlett NoseKevin  Dover M.D.   On: 09/07/2014 15:27   Ct Head Wo Contrast  09/07/2014   CLINICAL DATA:  64 year old male with altered mental status, possible fever  EXAM: CT HEAD WITHOUT CONTRAST  TECHNIQUE: Contiguous axial images were obtained from the base of the skull through the vertex without intravenous contrast.  COMPARISON:  Prior head CT 07/16/2014  FINDINGS: Negative for acute intracranial hemorrhage, acute infarction, mass, mass effect, hydrocephalus or midline shift. Gray-white differentiation is preserved throughout. Stable appearance of the brain with focal encephalomalacia in the bilateral frontal and inferior anterior temporal lobes. Dystrophic calcifications are present in the encephalomalacia kicked right frontal lobe. Right parietal approach ventriculoperitoneal shunt catheter. The tip is again noted in the region of the foramen of Monro. Stable ventricular configuration.  No focal soft tissue or calvarial abnormality. Partial opacification of the ethmoid air cells.  IMPRESSION: 1. No acute intracranial abnormality. 2. Unchanged appearance of the brain with extensive bilateral frontal and temporal encephalomalacia. Query past history of traumatic brain injury? 3. Right parietal approach ventriculoperitoneal shunt catheter remains in unchanged position. Stable configuration of the ventricles. 4. Inflammatory paranasal sinus disease with partial opacification of scattered ethmoid air cells.   Electronically  Signed   By: Malachy MoanHeath  McCullough M.D.   On: 09/07/2014 12:48    Scheduled Meds: . antiseptic oral rinse  7 mL Mouth Rinse BID  . azithromycin  500 mg Oral Daily  . cefTRIAXone (ROCEPHIN)  IV  1 g Intravenous Q24H  . docusate sodium  100 mg Oral BID  . folic acid  1 mg Oral Daily  . heparin  5,000 Units Subcutaneous 3 times per day  . multivitamin with minerals  1 tablet Oral Daily  . phenytoin  300 mg Oral QHS  . sodium chloride  3 mL Intravenous Q12H  . thiamine  100 mg Oral Daily   Or  . thiamine  100 mg Intravenous Daily   Continuous Infusions:    Principal Problem:   Sepsis Active Problems:   Seizures   Tobacco abuse   Hypothermia   Acute renal failure (ARF)   Metabolic acidosis   Altered mental status   Homelessness   Dilantin level too low   Leukocytosis   S/P ventriculoperitoneal shunt    Time spent: 30 min    Aishi Courts, [facility] Northview HospitalMACKENZIE  [facility] Pager [phone]. If 7PM-7AM, please contact night-coverage at [email or web address], password [facility] 09/09/2014, 11:52 AM  LOS: 2 days

## 2014-09-09 NOTE — Clinical Social Work Psychosocial (Signed)
Clinical Social Work Department BRIEF PSYCHOSOCIAL ASSESSMENT 09/09/2014  Patient:  Miguel [name], Miguel [name]     Account Number:  [id number]     Admit date:  09/07/2014  Clinical Social Worker:  Lovey Newcomer  Date/Time:  09/09/2014 02:30 PM  Referred by:  Physician  Date Referred:  09/09/2014 Referred for  Homelessness   Other Referral:   NA   Interview type:  Patient Other interview type:   Patient alert and oriented at time of assessment.    PSYCHOSOCIAL DATA Living Status:  ALONE Admitted from facility:   Level of care:   Primary support name:  [place] Primary support relationship to patient:  NONE Degree of support available:   Support is very limited.    CURRENT CONCERNS Current Concerns  Other - See comment   Other Concerns:   CSW consulted for homelessness.    SOCIAL WORK ASSESSMENT / PLAN CSW met with patient at bedside. Patient is alert and oriented but limited in his engagement in assessment due to difficulty speaking. Patient is able to provide some information for assessment and is able to reply to yes and no questions. Patient was found under bridge. CSW inquired about patient's supports. Patient's chart indicates a significant other, but patient only mentions a father. Patient states he has not asked his father about staying with him at least until warmer weather. CSW has encouraged patient to pursue this as an option if he thinks his father will shelter him even if just for a short period of time. Patient reports having difficulty getting his medications as well. CSW provided patient list of local shelters and encouraged patient to seek shelter especially during cold nights. Patient is a pleasant gentleman. Overall he exhibited normal affect but does appeared unkempt.   Assessment/plan status:  No Further Intervention Required Other assessment/ plan:   NONE   Information/referral to community resources:   CSW has provided patient with shelter list and  has encouraged patient to see if going to his father's home will be an option.    PATIENT'S/FAMILY'S RESPONSE TO PLAN OF CARE: Patient plans to utilize shelter resources at discharge or attempt to stay with his father. CSW signing off at this time.         Liz Beach MSW, [place], [place], [phone]

## 2014-09-09 NOTE — Clinical Social Work Note (Signed)
Clinical Social Worker continuing to follow patient for support and discharge planning needs.  CSW spoke with patient at bedside at length regarding his plans at discharge.  Patient states that he has not yet made contact with his father and does not plan to prior to discharge.  Patient is now able to remember the address where his girlfriend lives and states that with a bus pass he is able to go and stay with her.  CSW inquired if patient had been in contact with his girlfriend in which patient replied "no."  Patient states that he has stayed with her in the past during the really cold weather and feels that she will allow to stay again for several weeks.  CSW offered patient shelter options, boarding house options, and potential ALF options, however patient is adamant about discharging to his girlfriends home.  CSW to provide bus pass to patient upon discharge.  Clinical Social Worker will sign off for now as social work intervention is no longer needed. Please consult us again if new need arises.  Miguel [name], Kentucky[place]CSW [phone]  If patient were to discharge after 17:00 today please call [phone] for bus pass.
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
## 2014-09-10 LAB — BASIC METABOLIC PANEL
ANION GAP: 9 (ref 5–15)
BUN: 14 mg/dL (ref 6–23)
CALCIUM: 9.4 mg/dL (ref 8.4–10.5)
CO2: 25 mmol/L (ref 19–32)
Chloride: 106 mmol/L (ref 96–112)
Creatinine, Ser: 1.13 mg/dL (ref 0.50–1.35)
GFR calc non Af Amer: 67 mL/min — ABNORMAL LOW (ref >90)
GFR, EST AFRICAN AMERICAN: 78 mL/min — AB (ref >90)
GLUCOSE: 89 mg/dL (ref 70–99)
Potassium: 4.3 mmol/L (ref 3.5–5.1)
Sodium: 140 mmol/L (ref 135–145)

## 2014-09-10 LAB — CBC
HEMATOCRIT: 47.6 % (ref 39.0–52.0)
HEMOGLOBIN: 15.6 g/dL (ref 13.0–17.0)
MCH: 30.8 pg (ref 26.0–34.0)
MCHC: 32.8 g/dL (ref 30.0–36.0)
MCV: 94.1 fL (ref 78.0–100.0)
Platelets: 184 10*3/uL (ref 150–400)
RBC: 5.06 MIL/uL (ref 4.22–5.81)
RDW: 14.2 % (ref 11.5–15.5)
WBC: 6.4 10*3/uL (ref 4.0–10.5)

## 2014-09-10 MED ORDER — NICOTINE 7 MG/24HR TD PT24
7.0000 mg | MEDICATED_PATCH | Freq: Every day | TRANSDERMAL | Status: DC
  Administered 2014-09-10 – 2014-09-11 (×2): 7 mg via TRANSDERMAL
  Filled 2014-09-10 (×3): qty 1

## 2014-09-10 NOTE — Social Work (Signed)
CSW followed weekday CSW assess of patiet needs and provided patient with bus passes for discharge today.  No further CSW needs as patient is discharging.  Beverly Sessionsywan J Aleathea Pugmire MSW, LCSW [phone]

## 2014-09-10 NOTE — Progress Notes (Signed)
Shelters are all full per social work and patient was not able to reach any of his friends to verify he could stay with them.  Unable to discharge this patient without clear housing option given temperature is going to be 12 degrees tonight.

## 2014-09-11 NOTE — Progress Notes (Addendum)
Patient feeling well.  VSS.  Exam unchanged.  [facility]  He has bus pass and directions/address and is planning to head there directly.

## 2014-09-13 LAB — CULTURE, BLOOD (ROUTINE X 2)
Culture: NO GROWTH
Culture: NO GROWTH

## 2014-10-05 ENCOUNTER — Emergency Department (HOSPITAL_COMMUNITY)
Admission: EM | Admit: 2014-10-05 | Discharge: 2014-10-05 | Disposition: A | Discharge status: Home / Self Care | Payer: Medicare Other | Attending: Emergency Medicine | Admitting: Emergency Medicine

## 2014-10-05 ENCOUNTER — Encounter (HOSPITAL_COMMUNITY): Payer: Self-pay | Admitting: Emergency Medicine

## 2014-10-05 DIAGNOSIS — Z8669 Personal history of other diseases of the nervous system and sense organs: Secondary | ICD-10-CM | POA: Diagnosis not present

## 2014-10-05 DIAGNOSIS — I10 Essential (primary) hypertension: Secondary | ICD-10-CM | POA: Diagnosis not present

## 2014-10-05 DIAGNOSIS — R32 Unspecified urinary incontinence: Secondary | ICD-10-CM | POA: Diagnosis present

## 2014-10-05 DIAGNOSIS — Z86718 Personal history of other venous thrombosis and embolism: Secondary | ICD-10-CM | POA: Diagnosis not present

## 2014-10-05 DIAGNOSIS — Z87828 Personal history of other (healed) physical injury and trauma: Secondary | ICD-10-CM | POA: Diagnosis not present

## 2014-10-05 DIAGNOSIS — Z8673 Personal history of transient ischemic attack (TIA), and cerebral infarction without residual deficits: Secondary | ICD-10-CM | POA: Diagnosis not present

## 2014-10-05 DIAGNOSIS — R011 Cardiac murmur, unspecified: Secondary | ICD-10-CM | POA: Diagnosis not present

## 2014-10-05 DIAGNOSIS — Z720 Tobacco use: Secondary | ICD-10-CM | POA: Diagnosis not present

## 2014-10-05 DIAGNOSIS — F10129 Alcohol abuse with intoxication, unspecified: Secondary | ICD-10-CM | POA: Insufficient documentation

## 2014-10-05 DIAGNOSIS — IMO0002 Reserved for concepts with insufficient information to code with codable children: Secondary | ICD-10-CM

## 2014-10-05 LAB — BASIC METABOLIC PANEL
ANION GAP: 5 (ref 5–15)
BUN: 18 mg/dL (ref 6–23)
CO2: 24 mmol/L (ref 19–32)
Calcium: 8.6 mg/dL (ref 8.4–10.5)
Chloride: 112 mmol/L (ref 96–112)
Creatinine, Ser: 0.85 mg/dL (ref 0.50–1.35)
Glucose, Bld: 79 mg/dL (ref 70–99)
POTASSIUM: 4 mmol/L (ref 3.5–5.1)
Sodium: 141 mmol/L (ref 135–145)

## 2014-10-05 LAB — CBC WITH DIFFERENTIAL/PLATELET
BASOS PCT: 1 % (ref 0–1)
Basophils Absolute: 0 10*3/uL (ref 0.0–0.1)
Eosinophils Absolute: 0.3 10*3/uL (ref 0.0–0.7)
Eosinophils Relative: 5 % (ref 0–5)
HEMATOCRIT: 37.6 % — AB (ref 39.0–52.0)
Hemoglobin: 12.3 g/dL — ABNORMAL LOW (ref 13.0–17.0)
LYMPHS PCT: 35 % (ref 12–46)
Lymphs Abs: 2.5 10*3/uL (ref 0.7–4.0)
MCH: 31.1 pg (ref 26.0–34.0)
MCHC: 32.7 g/dL (ref 30.0–36.0)
MCV: 95.2 fL (ref 78.0–100.0)
MONOS PCT: 8 % (ref 3–12)
Monocytes Absolute: 0.6 10*3/uL (ref 0.1–1.0)
NEUTROS PCT: 51 % (ref 43–77)
Neutro Abs: 3.8 10*3/uL (ref 1.7–7.7)
PLATELETS: 211 10*3/uL (ref 150–400)
RBC: 3.95 MIL/uL — ABNORMAL LOW (ref 4.22–5.81)
RDW: 15 % (ref 11.5–15.5)
WBC: 7.2 10*3/uL (ref 4.0–10.5)

## 2014-10-05 LAB — BLOOD GAS, VENOUS
Acid-base deficit: 2.6 mmol/L — ABNORMAL HIGH (ref 0.0–2.0)
Bicarbonate: 22.4 mEq/L (ref 20.0–24.0)
O2 Saturation: 89.9 %
PH VEN: 7.349 — AB (ref 7.250–7.300)
Patient temperature: 98.6
TCO2: 20.4 mmol/L (ref 0–100)
pCO2, Ven: 41.7 mmHg — ABNORMAL LOW (ref 45.0–50.0)
pO2, Ven: 61.5 mmHg — ABNORMAL HIGH (ref 30.0–45.0)

## 2014-10-05 LAB — ACETAMINOPHEN LEVEL: Acetaminophen (Tylenol), Serum: <10 ug/mL — ABNORMAL LOW (ref 10–30)

## 2014-10-05 LAB — KETONES, URINE: KETONES UR: NEGATIVE mg/dL

## 2014-10-05 LAB — RAPID URINE DRUG SCREEN, HOSP PERFORMED
Amphetamines: NOT DETECTED
Barbiturates: NOT DETECTED
Benzodiazepines: NOT DETECTED
COCAINE: NOT DETECTED
Opiates: NOT DETECTED
TETRAHYDROCANNABINOL: NOT DETECTED

## 2014-10-05 LAB — OSMOLALITY: OSMOLALITY: 353 mosm/kg — AB (ref 275–300)

## 2014-10-05 LAB — SALICYLATE LEVEL: Salicylate Lvl: <4 mg/dL (ref 2.8–20.0)

## 2014-10-05 LAB — ETHANOL: Alcohol, Ethyl (B): 229 mg/dL — ABNORMAL HIGH (ref 0–9)

## 2014-10-05 MED ORDER — SODIUM CHLORIDE 0.9 % IV BOLUS (SEPSIS)
1000.0000 mL | Freq: Once | INTRAVENOUS | Status: AC
  Administered 2014-10-05: 1000 mL via INTRAVENOUS

## 2014-10-05 NOTE — Discharge Instructions (Signed)
°Emergency Department Resource Guide °1) Find a Doctor and Pay Out of Pocket °Although you won't have to find out who is covered by your insurance plan, it is a good idea to ask around and get recommendations. You will then need to call the office and see if the doctor you have chosen will accept you as a new patient and what types of options they offer for patients who are self-pay. Some doctors offer discounts or will set up payment plans for their patients who do not have insurance, but you will need to ask so you aren't surprised when you get to your appointment. ° °2) Contact Your Local Health Department °Not all health departments have doctors that can see patients for sick visits, but many do, so it is worth a call to see if yours does. If you don't know where your local health department is, you can check in your phone book. The CDC also has a tool to help you locate your state's health department, and many state websites also have listings of all of their local health departments. ° °3) Find a Walk-in Clinic °If your illness is not likely to be very severe or complicated, you may want to try a walk in clinic. These are popping up all over the country in pharmacies, drugstores, and shopping centers. They're usually staffed by nurse practitioners or physician assistants that have been trained to treat common illnesses and complaints. They're usually fairly quick and inexpensive. However, if you have serious medical issues or chronic medical problems, these are probably not your best option. ° °No Primary Care Doctor: °- Call [facility] at  832-8000 - they can help you locate a primary care doctor that  accepts your insurance, provides certain services, etc. °- Physician Referral Service- 1-800-533-3463 ° °Chronic Pain Problems: °Organization         Address  Phone   Notes  °[facility]  (336) 297-2271 Patients need to be referred by their primary care doctor.  ° °Medication  Assistance: °Organization         Address  Phone   Notes  °[place] [facility] [address] °[place], [place] [address] (336) 641-8030 --Must be a resident of [place] °-- Must have NO insurance coverage whatsoever (no Medicaid/ Medicare, etc.) °-- The pt. MUST have a primary care doctor that directs their care regularly and follows them in the community °  °[facility]  (866) 331-1348   °[employer]  (888) 892-1162   ° °Agencies that provide inexpensive medical care: °Organization         Address  Phone   Notes  °[facility]  (336) 832-8035   °[facility]    (336) 832-7272   °[facility] [address] °[place], [place] [address] (336) 832-4777   °[facility] [address], °[place] (336) 271-4999   °[facility]    (336) 373-0678   °[facility]    (336) 272-1050   °[facility] ° [address], [place] Phone:  (336) 832-4444, Fax:  (336) 832-4440 Hours of Operation:  9 am - 6 pm, M-F.  Also accepts Medicaid/Medicare and self-pay.  °[facility] ° [address], [place] Phone: (336) 832-3150, Fax: (336) 832-3151. Hours of Operation:  8:30 am - 5:30 pm, M-F.  Also accepts Medicaid and self-pay.  °[facility] [place] [address], [place] Phone: (336) 878-6027   °[facility] [address], [place], [place] (336)723-1848, Ext. 123 Mondays & Thursdays: 7-9 AM.  First 15 patients are seen on a first come, first serve basis. °  ° °Medicaid-accepting [place] Providers: ° °Organization         Address  Phone   Notes  °[facility] [address], [place] (336) 641-2100 Also accepts self-pay patients.  °[facility] [address], [place] ° (336) 856-9996   °[facility] [address], [place]  (336) 288-8857   °[facility] [address] [place] [address], [place] (336) 299-7000   °Veita Bland [address], [place]  ° (336) 373-1557 Only accepts [place] Access Medicaid patients after they have their name applied to their card.  ° °Self-Pay (no insurance) in [place]: ° °Organization         Address  Phone   Notes  °Sickle Cell Patients, [facility] [address], [place] (336) 832-1970   °[facility] [address], [place] (336) 832-4400   °[facility] [place] ° [address], [place] (336) 992-4800   °[facility]/Dr. Osei-Bonsu ° [address] [place] [address], [place] or [address], [place] (336) 841-8500 Phone number for both [place] and [place] locations is the same.  °[facility] [address], [place] (336) 299-0000   °[facility] [address] [place] [address], [place] or [address] (336) 852-7530 °(336) 878-2260   °[facility] [address], [place] (336) 350-1642, phone; (336) 294-5005, fax Sees patients 1st and 3rd Saturday of every month.  Must not qualify for public or private insurance (i.e. Medicaid, Medicare, [place] Health Choice, Veterans' Benefits) • Household income should be no more than 200% of the poverty level •The clinic cannot treat you if you are pregnant or think you are pregnant • Sexually transmitted diseases are not treated at the clinic.  ° ° °Dental Care: °Organization         Address  Phone  Notes  °[place] [facility] [address], [place] (336) 641-6152 Accepts children up to age 21 who are enrolled in Medicaid or [place] Health Choice; pregnant women with a Medicaid card; and children who have applied for Medicaid or [place] Health Choice, but were declined, whose parents can pay a reduced fee at time of service.  °[place]  [facility] [place]  [address], [place] (336) 641-7733 Accepts children up to age 21 who are enrolled in Medicaid or [place] Health Choice; pregnant women with a Medicaid card; and children who have applied for Medicaid or [place] Health Choice, but were declined, whose parents can pay a reduced fee at time of service.  °[facility] ° [address],  (336) 641-4533 Patients are seen by appointment only. Walk-ins are not accepted. [facility] will see patients 18 years of age and older. °Monday - Tuesday (8am-5pm) °Most Wednesdays (8:30-5pm) °$30 per visit, cash only  °[facility] ° [address], [place] (336) 641-4533 Patients are seen by appointment only. Walk-ins are not accepted. [facility] will see patients 18 years of age and older. °One   Wednesday Evening (Monthly: Volunteer Based).  $30 per visit, cash only  °[facility]  (919) 537-3737 for adults; Children under age 4, call Graduate Pediatric Dentistry at (919) [phone]. Children aged 4-14, please call (919) [phone] to request a pediatric application. ° Dental services are provided in all areas of dental care including fillings, crowns and bridges, complete and partial dentures, implants, gum treatment, root canals, and extractions. Preventive care is also provided. Treatment is provided to both adults and children. °Patients are selected via a lottery and there is often a waiting list. °  °[facility] [address], °[place] ° (336) 763-8833 [email or web address] °  °[facility] [address], [place], [place] (336)723-1848, Ext. 123 Second and Fourth Thursday of each month, opens at 6:30 AM; Clinic ends at 9 AM.  Patients are seen on a first-come first-served basis, and a limited number are seen during each clinic.  ° °[facility] ° [address], [place], [place] (336) 723-7904    Eligibility Requirements °You must have lived in [place], [place], or [place] counties for at least the last three months. °  You cannot be eligible for state or federal sponsored healthcare insurance, including Veterans Administration, Medicaid, or Medicare. °  You generally cannot be eligible for healthcare insurance through your employer.  °  How to apply: °Eligibility screenings are held every Tuesday and Wednesday afternoon from 1:00 pm until 4:00 pm. You do not need an appointment for the interview!  °[facility] [address], [place], [place] 336-631-2330   °[facility]  336-342-8273   °[facility]  336-703-3100   °[facility]  336-570-6415   ° °Behavioral Health Resources in the Community: °Intensive Outpatient Programs °Organization         Address  Phone  Notes  °[facility] [address], [place], [place] 336-878-6098   °[facility] [address], [place], [place] 336-832-9800   °[facility] [address], [place], [place] ° 336-882-2125   °[facility] [address],  °[place], [place] 1-800-853-5163 or 336-641-4981   °Substance Abuse Resources °Organization         Address  Phone  Notes  °[facility]  336-882-2125   °[facility]  336-784-9470   °[facility]  336-285-9073   °[facility]  336-845-3988   °Residential & Outpatient Substance Abuse Program  1-800-659-3381   °Psychological Services °Organization         Address  Phone  Notes  °[facility]  336- 832-9600   °[facility]  336- 378-7881   °[facility] [address], [place] 1-800-853-5163 or 336-641-4981   ° °Mobile Crisis Teams °Organization         Address  Phone  Notes  °[facility], Mobile Crisis Care Unit  1-877-626-1772   °Assertive °[facility] ° [address]  [place], [place] 336-834-9664   °Sharon DeEsch [address] °[place] 336-554-5454   ° °Self-Help/Support Groups °Organization         Address  Phone             Notes  °[facility] - variety of support groups  336- 373-1402 Call for more information  °Narcotics Anonymous (NA), [facility] [address], °[place]   2 meetings at this location  ° °  Residential Treatment Programs °Organization         Address  Phone  Notes  °[facility] [address],    °[place]  1-866-801-8205   °[facility] ° [address], [place], [place] 704-293-8524   °[facility] [address], [place] 336-845-3988 Admissions: 8am-3pm M-F  °[facility] [address]    °[place], [place] 336-841-1104   °[facility] [address], [place], [place] 336-379-7146   °[facility] [address]  °[place], [place] 336-285-9073   °[facility] Intensive Outpatient [address], [place], [place] 336-852-3033   °[facility] [address]  °[place], [place] 1-877-615-2722 or 336-784-9470   °[facility] [address] [place], [place] 336-227-7417 Accepts Medicaid  °[facility] [address]  °[place] 1-800-659-3381 Substance Abuse/Addiction Treatment  ° °[place] Behavioral Health Resources °Organization         Address  Phone  Notes  °[facility]  (888) 581-9988   °Julie Brannon, PhD [address] [place], [place]   (336) 349-5553 or (336) 951-0000   °[facility]   [address] °[place], [place] (336) 349-4454   °[facility] [address], [place], [place] (336) 342-8316 Insurance/Medicaid/sponsorship through [facility]  °[facility] [address]                                    [place], [place] (336) 342-8316 Therapy/tele-psych/case    °[facility] [address]  ° [place], [place] (336) 349-2233    °Dr. Arfeen  (336) 349-4544   °[facility] [place]  [employer] [place] [facility] 1) [address], [place] °2) [address], [place] °3)  [address], [place] (336) 349-3220 °(336) 342-7768 ° °(336) 342-8140   °[place] [facility] (336) 342-1394 or (336) 342-3537 (After Hours)    ° ° °

## 2014-10-05 NOTE — ED Notes (Signed)
Bed: WHALD Expected date:  Expected time:  Means of arrival:  Comments: 

## 2014-10-05 NOTE — ED Notes (Signed)
Pt's clothes were washed and dried.  Folded by bedside for when pt is discharged.
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
## 2014-10-05 NOTE — ED Notes (Signed)
Bed: AO13WA16 Expected date:  Expected time:  Means of arrival:  Comments: Homeless, incontinent, seizure? [facility] [employer]

## 2014-10-05 NOTE — ED Provider Notes (Signed)
7:09 PM Patient is clinically sober at this time.  He is walking around emergency department without difficulty.  He would like to go home.  Discharge home in good condition.  Vitals are normal.  Azalia BilisKevin Yoshimi Sarr, MD 10/05/14 [phone]

## 2014-10-05 NOTE — ED Notes (Addendum)
Per EMS: [facility] [employer] [facility] [employer].  Pt found with bottle of wintergreen isopropyl alcohol.  Pt states hx of seizures. Incontinent.  Unable to understand pt's mumblings.

## 2014-10-05 NOTE — Progress Notes (Signed)
CSW attempted to meet with pt at bedside. However, pt was asleep. There was no family present.  Per note, pt is homeless and was found at a [facility]ster [facility] [employer].  CSW left shelter and food pantry information at bedside.  Trish MageBrittney Arye Weyenberg, LCSWA [phone] ED CSW 10/05/2014 6:58 PM

## 2014-10-14 ENCOUNTER — Inpatient Hospital Stay (HOSPITAL_COMMUNITY)
Admission: EM | Admit: 2014-10-14 | Discharge: 2014-10-18 | DRG: 100 | Disposition: A | Discharge status: Skilled Nursing Facility | Payer: Medicare Other | Attending: Internal Medicine | Admitting: Internal Medicine

## 2014-10-14 ENCOUNTER — Encounter (HOSPITAL_COMMUNITY): Payer: Self-pay | Admitting: Emergency Medicine

## 2014-10-14 ENCOUNTER — Emergency Department (HOSPITAL_COMMUNITY): Payer: Medicare Other

## 2014-10-14 DIAGNOSIS — Z982 Presence of cerebrospinal fluid drainage device: Secondary | ICD-10-CM

## 2014-10-14 DIAGNOSIS — Z9114 Patient's other noncompliance with medication regimen: Secondary | ICD-10-CM | POA: Diagnosis present

## 2014-10-14 DIAGNOSIS — Z981 Arthrodesis status: Secondary | ICD-10-CM

## 2014-10-14 DIAGNOSIS — R404 Transient alteration of awareness: Secondary | ICD-10-CM

## 2014-10-14 DIAGNOSIS — Z590 Homelessness: Secondary | ICD-10-CM

## 2014-10-14 DIAGNOSIS — E872 Acidosis: Secondary | ICD-10-CM | POA: Diagnosis present

## 2014-10-14 DIAGNOSIS — R569 Unspecified convulsions: Secondary | ICD-10-CM

## 2014-10-14 DIAGNOSIS — Z86718 Personal history of other venous thrombosis and embolism: Secondary | ICD-10-CM

## 2014-10-14 DIAGNOSIS — Z8673 Personal history of transient ischemic attack (TIA), and cerebral infarction without residual deficits: Secondary | ICD-10-CM

## 2014-10-14 DIAGNOSIS — F1010 Alcohol abuse, uncomplicated: Secondary | ICD-10-CM | POA: Diagnosis present

## 2014-10-14 DIAGNOSIS — Z79899 Other long term (current) drug therapy: Secondary | ICD-10-CM

## 2014-10-14 DIAGNOSIS — F17210 Nicotine dependence, cigarettes, uncomplicated: Secondary | ICD-10-CM | POA: Diagnosis present

## 2014-10-14 DIAGNOSIS — G40909 Epilepsy, unspecified, not intractable, without status epilepticus: Secondary | ICD-10-CM | POA: Diagnosis not present

## 2014-10-14 DIAGNOSIS — G9340 Encephalopathy, unspecified: Secondary | ICD-10-CM | POA: Diagnosis present

## 2014-10-14 DIAGNOSIS — I10 Essential (primary) hypertension: Secondary | ICD-10-CM | POA: Diagnosis present

## 2014-10-14 DIAGNOSIS — T1490XA Injury, unspecified, initial encounter: Secondary | ICD-10-CM

## 2014-10-14 DIAGNOSIS — R270 Ataxia, unspecified: Secondary | ICD-10-CM

## 2014-10-14 LAB — COMPREHENSIVE METABOLIC PANEL
ALK PHOS: 55 U/L (ref 39–117)
ALT: 27 U/L (ref 0–53)
AST: 38 U/L — ABNORMAL HIGH (ref 0–37)
Albumin: 3.8 g/dL (ref 3.5–5.2)
Anion gap: 19 — ABNORMAL HIGH (ref 5–15)
BILIRUBIN TOTAL: 0.8 mg/dL (ref 0.3–1.2)
BUN: 24 mg/dL — AB (ref 6–23)
CO2: 18 mmol/L — AB (ref 19–32)
Calcium: 8.8 mg/dL (ref 8.4–10.5)
Chloride: 107 mmol/L (ref 96–112)
Creatinine, Ser: 1.11 mg/dL (ref 0.50–1.35)
GFR calc Af Amer: 80 mL/min — ABNORMAL LOW (ref >90)
GFR, EST NON AFRICAN AMERICAN: 69 mL/min — AB (ref >90)
GLUCOSE: 81 mg/dL (ref 70–99)
POTASSIUM: 4.8 mmol/L (ref 3.5–5.1)
SODIUM: 144 mmol/L (ref 135–145)
TOTAL PROTEIN: 7.6 g/dL (ref 6.0–8.3)

## 2014-10-14 LAB — RAPID URINE DRUG SCREEN, HOSP PERFORMED
Amphetamines: NOT DETECTED
Barbiturates: NOT DETECTED
Benzodiazepines: NOT DETECTED
Cocaine: NOT DETECTED
Opiates: NOT DETECTED
Tetrahydrocannabinol: NOT DETECTED

## 2014-10-14 LAB — URINALYSIS, ROUTINE W REFLEX MICROSCOPIC
Bilirubin Urine: NEGATIVE
GLUCOSE, UA: NEGATIVE mg/dL
Ketones, ur: NEGATIVE mg/dL
Leukocytes, UA: NEGATIVE
NITRITE: NEGATIVE
PH: 5 (ref 5.0–8.0)
Protein, ur: 30 mg/dL — AB
SPECIFIC GRAVITY, URINE: 1.021 (ref 1.005–1.030)
Urobilinogen, UA: 0.2 mg/dL (ref 0.0–1.0)

## 2014-10-14 LAB — CBG MONITORING, ED
GLUCOSE-CAPILLARY: 145 mg/dL — AB (ref 70–99)
Glucose-Capillary: 69 mg/dL — ABNORMAL LOW (ref 70–99)

## 2014-10-14 LAB — URINE MICROSCOPIC-ADD ON

## 2014-10-14 LAB — CBC
HCT: 42.2 % (ref 39.0–52.0)
Hemoglobin: 13.7 g/dL (ref 13.0–17.0)
MCH: 31.1 pg (ref 26.0–34.0)
MCHC: 32.5 g/dL (ref 30.0–36.0)
MCV: 95.9 fL (ref 78.0–100.0)
Platelets: 198 10*3/uL (ref 150–400)
RBC: 4.4 MIL/uL (ref 4.22–5.81)
RDW: 15.2 % (ref 11.5–15.5)
WBC: 9.1 10*3/uL (ref 4.0–10.5)

## 2014-10-14 LAB — I-STAT CG4 LACTIC ACID, ED
LACTIC ACID, VENOUS: 0.77 mmol/L (ref 0.5–2.0)
Lactic Acid, Venous: 5.32 mmol/L (ref 0.5–2.0)
Lactic Acid, Venous: 1.06 mmol/L (ref 0.5–2.0)

## 2014-10-14 LAB — PHENYTOIN LEVEL, TOTAL: Phenytoin Lvl: <2.5 ug/mL — ABNORMAL LOW (ref 10.0–20.0)

## 2014-10-14 LAB — ETHANOL: Alcohol, Ethyl (B): <5 mg/dL (ref 0–9)

## 2014-10-14 MED ORDER — FOLIC ACID 1 MG PO TABS
1.0000 mg | ORAL_TABLET | Freq: Every day | ORAL | Status: DC
  Administered 2014-10-15 – 2014-10-18 (×4): 1 mg via ORAL
  Filled 2014-10-14 (×4): qty 1

## 2014-10-14 MED ORDER — LORAZEPAM 1 MG PO TABS: 1.0000 mg | ORAL_TABLET | Freq: Four times a day (QID) | ORAL | Status: AC | PRN

## 2014-10-14 MED ORDER — THIAMINE HCL 100 MG/ML IJ SOLN
100.0000 mg | Freq: Every day | INTRAMUSCULAR | Status: DC
  Filled 2014-10-14 (×4): qty 1

## 2014-10-14 MED ORDER — HEPARIN SODIUM (PORCINE) 5000 UNIT/ML IJ SOLN
5000.0000 [IU] | Freq: Three times a day (TID) | INTRAMUSCULAR | Status: DC
  Administered 2014-10-15 – 2014-10-18 (×11): 5000 [IU] via SUBCUTANEOUS
  Filled 2014-10-14 (×14): qty 1

## 2014-10-14 MED ORDER — SODIUM CHLORIDE 0.9 % IV SOLN
INTRAVENOUS | Status: DC
  Administered 2014-10-15 – 2014-10-18 (×5): via INTRAVENOUS

## 2014-10-14 MED ORDER — LORAZEPAM 2 MG/ML IJ SOLN
0.0000 mg | Freq: Two times a day (BID) | INTRAMUSCULAR | Status: DC
  Administered 2014-10-17 (×2): 1 mg via INTRAVENOUS
  Filled 2014-10-14: qty 1

## 2014-10-14 MED ORDER — LORAZEPAM 2 MG/ML IJ SOLN: 0.0000 mg | Freq: Four times a day (QID) | INTRAMUSCULAR | Status: AC

## 2014-10-14 MED ORDER — THIAMINE HCL 100 MG/ML IJ SOLN
Freq: Once | INTRAVENOUS | Status: AC
  Administered 2014-10-15: 01:00:00 via INTRAVENOUS
  Filled 2014-10-14: qty 1000

## 2014-10-14 MED ORDER — LORAZEPAM 2 MG/ML IJ SOLN
1.0000 mg | Freq: Four times a day (QID) | INTRAMUSCULAR | Status: AC | PRN
  Filled 2014-10-14: qty 1

## 2014-10-14 MED ORDER — VITAMIN B-1 100 MG PO TABS
100.0000 mg | ORAL_TABLET | Freq: Every day | ORAL | Status: DC
  Administered 2014-10-15 – 2014-10-18 (×4): 100 mg via ORAL
  Filled 2014-10-14 (×4): qty 1

## 2014-10-14 MED ORDER — SODIUM CHLORIDE 0.9 % IV SOLN
1000.0000 mg | Freq: Once | INTRAVENOUS | Status: AC
  Administered 2014-10-14: 1000 mg via INTRAVENOUS
  Filled 2014-10-14: qty 20

## 2014-10-14 MED ORDER — DEXTROSE 50 % IV SOLN
1.0000 | Freq: Once | INTRAVENOUS | Status: AC
  Administered 2014-10-14: 50 mL via INTRAVENOUS
  Filled 2014-10-14: qty 50

## 2014-10-14 MED ORDER — SODIUM CHLORIDE 0.9 % IV BOLUS (SEPSIS)
1000.0000 mL | Freq: Once | INTRAVENOUS | Status: AC
  Administered 2014-10-14: 1000 mL via INTRAVENOUS

## 2014-10-14 MED ORDER — ADULT MULTIVITAMIN W/MINERALS CH
1.0000 | ORAL_TABLET | Freq: Every day | ORAL | Status: DC
  Administered 2014-10-15 – 2014-10-18 (×4): 1 via ORAL
  Filled 2014-10-14 (×4): qty 1

## 2014-10-14 NOTE — ED Notes (Signed)
Two unsuccessful IV attempt by this RN. Miguel [name] at bedside attempting IV at present time.

## 2014-10-14 NOTE — ED Notes (Signed)
Pt to CT
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
## 2014-10-14 NOTE — Progress Notes (Signed)
pcp updated in EPIC to [facility] as listed for e medicaid [place] access response hx as pcp This pt with 5 ED visits and 1 chs admission in the last 6 months Pt found laying on side of [facility] with ETOH/possible seizure. Pt alert to self and incontinent [facility] Admit date: 09/07/2014 Discharge date: 09/10/2014 Recommendations for Outpatient Follow-up:  1.   Community [facility] wellness for prescription medication assistance. ED CM unable to [facility].   Repeat BMP in about 1 week to confirm resolution of AKI and follow up on diarrhea  Discharge Diagnoses: Principal Problem: Sepsis Active Problems:  Seizures,  Tobacco abuse, Hypothermia,  Acute renal failure (ARF), Metabolic acidosis, Altered mental status, Homelessness

## 2014-10-14 NOTE — ED Notes (Signed)
Pt receiving bed bath per Frazier Buttachel Covil NT at present time; pt covered in diarrhea on arrival.

## 2014-10-14 NOTE — ED Notes (Signed)
Per EMS pt found laying on side of [facility] with ETOH/possible seizure. Pt alert to self and incontinent. 26 [address] NPA in right nare.

## 2014-10-14 NOTE — ED Notes (Signed)
EDP given CG4 lactic result. 

## 2014-10-15 ENCOUNTER — Observation Stay (HOSPITAL_COMMUNITY): Payer: Medicare Other

## 2014-10-15 DIAGNOSIS — Z982 Presence of cerebrospinal fluid drainage device: Secondary | ICD-10-CM | POA: Diagnosis not present

## 2014-10-15 DIAGNOSIS — F1010 Alcohol abuse, uncomplicated: Secondary | ICD-10-CM

## 2014-10-15 DIAGNOSIS — R4182 Altered mental status, unspecified: Secondary | ICD-10-CM | POA: Diagnosis not present

## 2014-10-15 DIAGNOSIS — R569 Unspecified convulsions: Secondary | ICD-10-CM

## 2014-10-15 DIAGNOSIS — Z79899 Other long term (current) drug therapy: Secondary | ICD-10-CM | POA: Diagnosis not present

## 2014-10-15 DIAGNOSIS — Z590 Homelessness: Secondary | ICD-10-CM | POA: Diagnosis not present

## 2014-10-15 DIAGNOSIS — Z86718 Personal history of other venous thrombosis and embolism: Secondary | ICD-10-CM | POA: Diagnosis not present

## 2014-10-15 DIAGNOSIS — R404 Transient alteration of awareness: Secondary | ICD-10-CM | POA: Diagnosis not present

## 2014-10-15 DIAGNOSIS — R400 Somnolence: Secondary | ICD-10-CM | POA: Diagnosis not present

## 2014-10-15 DIAGNOSIS — R270 Ataxia, unspecified: Secondary | ICD-10-CM | POA: Diagnosis not present

## 2014-10-15 DIAGNOSIS — Z8673 Personal history of transient ischemic attack (TIA), and cerebral infarction without residual deficits: Secondary | ICD-10-CM | POA: Diagnosis not present

## 2014-10-15 DIAGNOSIS — G40909 Epilepsy, unspecified, not intractable, without status epilepticus: Secondary | ICD-10-CM | POA: Diagnosis not present

## 2014-10-15 DIAGNOSIS — I10 Essential (primary) hypertension: Secondary | ICD-10-CM | POA: Diagnosis not present

## 2014-10-15 DIAGNOSIS — Z9114 Patient's other noncompliance with medication regimen: Secondary | ICD-10-CM | POA: Diagnosis present

## 2014-10-15 DIAGNOSIS — E872 Acidosis: Secondary | ICD-10-CM | POA: Diagnosis present

## 2014-10-15 DIAGNOSIS — F17210 Nicotine dependence, cigarettes, uncomplicated: Secondary | ICD-10-CM | POA: Diagnosis present

## 2014-10-15 DIAGNOSIS — Z981 Arthrodesis status: Secondary | ICD-10-CM | POA: Diagnosis not present

## 2014-10-15 DIAGNOSIS — G9340 Encephalopathy, unspecified: Secondary | ICD-10-CM | POA: Diagnosis not present

## 2014-10-15 LAB — CBC WITH DIFFERENTIAL/PLATELET
BASOS ABS: 0 10*3/uL (ref 0.0–0.1)
BASOS PCT: 0 % (ref 0–1)
EOS ABS: 0.1 10*3/uL (ref 0.0–0.7)
Eosinophils Relative: 2 % (ref 0–5)
HCT: 37.3 % — ABNORMAL LOW (ref 39.0–52.0)
HEMOGLOBIN: 12 g/dL — AB (ref 13.0–17.0)
Lymphocytes Relative: 29 % (ref 12–46)
Lymphs Abs: 1.8 10*3/uL (ref 0.7–4.0)
MCH: 30.8 pg (ref 26.0–34.0)
MCHC: 32.2 g/dL (ref 30.0–36.0)
MCV: 95.6 fL (ref 78.0–100.0)
Monocytes Absolute: 0.8 10*3/uL (ref 0.1–1.0)
Monocytes Relative: 13 % — ABNORMAL HIGH (ref 3–12)
NEUTROS PCT: 56 % (ref 43–77)
Neutro Abs: 3.5 10*3/uL (ref 1.7–7.7)
Platelets: 181 10*3/uL (ref 150–400)
RBC: 3.9 MIL/uL — ABNORMAL LOW (ref 4.22–5.81)
RDW: 15.6 % — AB (ref 11.5–15.5)
WBC: 6.3 10*3/uL (ref 4.0–10.5)

## 2014-10-15 LAB — COMPREHENSIVE METABOLIC PANEL
ALK PHOS: 47 U/L (ref 39–117)
ALT: 21 U/L (ref 0–53)
ANION GAP: 9 (ref 5–15)
AST: 28 U/L (ref 0–37)
Albumin: 3.3 g/dL — ABNORMAL LOW (ref 3.5–5.2)
BUN: 13 mg/dL (ref 6–23)
CALCIUM: 8.4 mg/dL (ref 8.4–10.5)
CHLORIDE: 110 mmol/L (ref 96–112)
CO2: 24 mmol/L (ref 19–32)
CREATININE: 1 mg/dL (ref 0.50–1.35)
GFR calc Af Amer: >90 mL/min (ref >90)
GFR, EST NON AFRICAN AMERICAN: 78 mL/min — AB (ref >90)
GLUCOSE: 90 mg/dL (ref 70–99)
POTASSIUM: 3.9 mmol/L (ref 3.5–5.1)
Sodium: 143 mmol/L (ref 135–145)
Total Bilirubin: 1.2 mg/dL (ref 0.3–1.2)
Total Protein: 6.5 g/dL (ref 6.0–8.3)

## 2014-10-15 LAB — PHENYTOIN LEVEL, TOTAL: Phenytoin Lvl: 14.9 ug/mL (ref 10.0–20.0)

## 2014-10-15 LAB — MAGNESIUM: MAGNESIUM: 1.8 mg/dL (ref 1.5–2.5)

## 2014-10-15 MED ORDER — PHENYTOIN SODIUM EXTENDED 100 MG PO CAPS
300.0000 mg | ORAL_CAPSULE | Freq: Every day | ORAL | Status: DC
  Administered 2014-10-15 – 2014-10-17 (×3): 300 mg via ORAL
  Filled 2014-10-15 (×4): qty 3

## 2014-10-15 MED ORDER — ONDANSETRON HCL 4 MG/2ML IJ SOLN: 4.0000 mg | Freq: Three times a day (TID) | INTRAMUSCULAR | Status: AC | PRN
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
## 2014-10-15 NOTE — Progress Notes (Signed)
UR completed 

## 2014-10-15 NOTE — Plan of Care (Signed)
Problem: Phase I Progression Outcomes Goal: OOB as tolerated unless otherwise ordered Outcome: Not Applicable Date Met:  26/37/85 Bed rest

## 2014-10-15 NOTE — Progress Notes (Signed)
[facility] PROGRESS NOTE  Miguel [name] GNF:[id number] DOB: 11-Jun-1951 DOA: 10/14/2014 PCP: [facility]  Assessment/Plan: 1- Seizure/Encephalopathy - 2/2 noncompliance with dilantin and alcohol -baseline anti-epileptic medication non compliance 2/2 cost issues -head CT WNL -s/p dilantin load -f/u neuro recs (neuro consult by EDP) - Seizure precautions - no episodes since admission. Consult social work for medication assistance.  2-ETOH abuse -ETOH level WNL  -CIWA protocol  -banana bag   3. Debility - PT eval  4. Homeless state  5. H/O HTN - BP is well controlled during stay. -Not on meds.  Code Status: Full Family Communication: patient Disposition Plan: home pending Pt eval and casemgmt for meds.   HPI/Subjective: This is a 64 y.o. year old male with significant past medical history of homelessness, ETOH abuse, seizure disorder presenting with seizure/found down. Level V caveat as pt is confused-poor historian. Per report, pt found [facility] at [facility]. Had confusion on EMS arrival per report.thought to have had a seizure. Has not able to afford dilantin 2/2 homelessness per report. Presented to ER afebrile, hemodynamically stable. CBC and CMET grossly WNL. Dilantin level <2.5. Lactate 5.32--> 1 s/p fluid bolus. ETOH level WNL. Pt reports last drinking 2 days ago. 1/5th of liquor. Head CT w/ no acute findings. Given IV dilantin load.  - no seizures since admission.  Objective: Filed Vitals:   10/15/14 0638  BP: 145/80  Pulse: 70  Temp: 98.7 F (37.1 C)  Resp: 18    Intake/Output Summary (Last 24 hours) at 10/15/14 1410 Last data filed at 10/15/14 1300  Gross per 24 hour  Intake 1127.08 ml  Output   2925 ml  Net -1797.92 ml   There were no vitals filed for this visit.  Exam:  General: mildly confused, disshevled appearing HEENT: PERRLA and extra ocular movement intact Heart: S1, S2 normal, no murmur, rub or gallop, regular rate and  rhythm Lungs: clear to auscultation, no wheezes or rales and unlabored breathing Abdomen: abdomen is soft without significant tenderness, masses, organomegaly or guarding Extremities: extremities normal, atraumatic, no cyanosis or edema Skin:no rashes Neurology: normal without focal findings Data Reviewed: Basic Metabolic Panel:  Recent Labs Lab 10/14/14 1502 10/15/14 0316  NA 144 143  K 4.8 3.9  CL 107 110  CO2 18* 24  GLUCOSE 81 90  BUN 24* 13  CREATININE 1.11 1.00  CALCIUM 8.8 8.4  MG  --  1.8   Liver Function Tests:  Recent Labs Lab 10/14/14 1502 10/15/14 0316  AST 38* 28  ALT 27 21  ALKPHOS 55 47  BILITOT 0.8 1.2  PROT 7.6 6.5  ALBUMIN 3.8 3.3*   No results for input(s): LIPASE, AMYLASE in the last 168 hours. No results for input(s): AMMONIA in the last 168 hours. CBC:  Recent Labs Lab 10/14/14 1502 10/15/14 0316  WBC 9.1 6.3  NEUTROABS  --  3.5  HGB 13.7 12.0*  HCT 42.2 37.3*  MCV 95.9 95.6  PLT 198 181   Cardiac Enzymes: No results for input(s): CKTOTAL, CKMB, CKMBINDEX, TROPONINI in the last 168 hours. BNP (last 3 results) No results for input(s): BNP in the last 8760 hours.  ProBNP (last 3 results) No results for input(s): PROBNP in the last 8760 hours.  CBG:  Recent Labs Lab 10/14/14 1518 10/14/14 1600  GLUCAP 69* 145*    No results found for this or any previous visit (from the past 240 hour(s)).   Studies: Ct Head Wo Contrast  10/14/2014   CLINICAL  DATA:  Found down, EtOH, possible seizure  EXAM: CT HEAD WITHOUT CONTRAST  TECHNIQUE: Contiguous axial images were obtained from the base of the skull through the vertex without intravenous contrast.  COMPARISON:  09/07/2014  FINDINGS: Stable encephalomalacic changes involving the bilateral frontal lobes, right greater than left, with associated dystrophic calcifications on the right. Additional encephalomalacic changes involving the right temporal lobe.  Overlying small chronic left  frontal extra-axial collection (series 2/image 16).  Right occipital approach ventriculostomy catheter in stable position, without ventriculomegaly.  No mass lesion, mass effect, or midline shift.  No CT evidence of acute infarction.  Moderate mucosal thickening in the left maxillary sinus. Mild mucosal thickening in the right maxillary sinus. Partial opacification of the bilateral frontal and ethmoid sinuses. Mastoid air cells are clear.  No evidence of calvarial fracture.  IMPRESSION: No evidence of acute intracranial abnormality.  Chronic changes involving the bilateral frontal and right temporal lobes, as above. Overlying small chronic left frontal extra-axial collection.  Stable right occipital approach ventriculostomy catheter, without ventriculomegaly.   Electronically Signed   By: Charline BillsSriyesh  Krishnan M.D.   On: 10/14/2014 16:33   Mr Brain Wo Contrast  10/15/2014   CLINICAL DATA:  ETOH abuse with seizure disorder. Found down on 10/14/2014.  EXAM: MRI HEAD WITHOUT CONTRAST  TECHNIQUE: Multiplanar, multiecho pulse sequences of the brain and surrounding structures were obtained without intravenous contrast.  COMPARISON:  CT head 10/14/2014.  MR head 10/14/2009.  FINDINGS: There is considerable artifact from the patient's ventriculoperitoneal shunt.  Within limits of visualization due to susceptibility, no restricted diffusion to suggest acute infarction. Generalized atrophy with chronic microvascular ischemic change. VP shunt catheter lies within the RIGHT lateral ventricle from posterior approach. Extensive encephalomalacia of the RIGHT greater than LEFT frontal lobes and BILATERAL temporal tips. Extensive gliosis of the BILATERAL frontal white matter.  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 Scattered BILATERAL lacunar infarcts, most notable in the LEFT thalamus. Mineralization related to  remote infarction or hematoma RIGHT frontal lobe, stable. Chronic LEFT mastoid disease. Chronic paranasal sinus disease.  IMPRESSION: Within limits of assessment given the ventriculoperitoneal shunt, no acute intracranial findings.  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   Electronically Signed   By: Davonna BellingJohn  Curnes M.D.   On: 10/15/2014 11:24    Scheduled Meds: . folic acid  1 mg Oral Daily  . heparin  5,000 Units Subcutaneous 3 times per day  . LORazepam  0-4 mg Intravenous Q6H   Followed by  . [START ON 10/17/2014] LORazepam  0-4 mg Intravenous Q12H  . multivitamin with minerals  1 tablet Oral Daily  . phenytoin  300 mg Oral QHS  . thiamine  100 mg Oral Daily   Or  . thiamine  100 mg Intravenous Daily   Continuous Infusions: . sodium chloride 75 mL/hr at 10/15/14 1252    Active Problems:   Altered mental status   Seizure    Time spent: 40min    Estefano Victory  [facility] Pager [phone] If 7PM-7AM, please contact night-coverage at [email or web address], password [email or web address] 10/15/2014, 2:10 PM

## 2014-10-15 NOTE — ED Notes (Signed)
Attempted to give report Nursing staff unavailable Call back number provided

## 2014-10-15 NOTE — ED Notes (Signed)
Report given to Vonna KotykJay, RN on [address] All questions answered

## 2014-10-16 DIAGNOSIS — R270 Ataxia, unspecified: Secondary | ICD-10-CM

## 2014-10-16 LAB — COMPREHENSIVE METABOLIC PANEL
ALBUMIN: 3.4 g/dL — AB (ref 3.5–5.2)
ALK PHOS: 50 U/L (ref 39–117)
ALT: 19 U/L (ref 0–53)
AST: 27 U/L (ref 0–37)
Anion gap: 9 (ref 5–15)
BUN: 10 mg/dL (ref 6–23)
CALCIUM: 8.3 mg/dL — AB (ref 8.4–10.5)
CHLORIDE: 108 mmol/L (ref 96–112)
CO2: 23 mmol/L (ref 19–32)
CREATININE: 0.97 mg/dL (ref 0.50–1.35)
GFR calc Af Amer: >90 mL/min (ref >90)
GFR, EST NON AFRICAN AMERICAN: 86 mL/min — AB (ref >90)
Glucose, Bld: 94 mg/dL (ref 70–99)
POTASSIUM: 3.8 mmol/L (ref 3.5–5.1)
Sodium: 140 mmol/L (ref 135–145)
Total Bilirubin: 0.6 mg/dL (ref 0.3–1.2)
Total Protein: 6.5 g/dL (ref 6.0–8.3)

## 2014-10-16 LAB — CBC WITH DIFFERENTIAL/PLATELET
BASOS ABS: 0 10*3/uL (ref 0.0–0.1)
Basophils Relative: 1 % (ref 0–1)
EOS PCT: 2 % (ref 0–5)
Eosinophils Absolute: 0.1 10*3/uL (ref 0.0–0.7)
HCT: 38.8 % — ABNORMAL LOW (ref 39.0–52.0)
Hemoglobin: 12.5 g/dL — ABNORMAL LOW (ref 13.0–17.0)
Lymphocytes Relative: 41 % (ref 12–46)
Lymphs Abs: 2 10*3/uL (ref 0.7–4.0)
MCH: 31 pg (ref 26.0–34.0)
MCHC: 32.2 g/dL (ref 30.0–36.0)
MCV: 96.3 fL (ref 78.0–100.0)
MONO ABS: 0.7 10*3/uL (ref 0.1–1.0)
MONOS PCT: 14 % — AB (ref 3–12)
Neutro Abs: 2 10*3/uL (ref 1.7–7.7)
Neutrophils Relative %: 42 % — ABNORMAL LOW (ref 43–77)
Platelets: 192 10*3/uL (ref 150–400)
RBC: 4.03 MIL/uL — AB (ref 4.22–5.81)
RDW: 15.6 % — AB (ref 11.5–15.5)
WBC: 4.8 10*3/uL (ref 4.0–10.5)
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
## 2014-10-16 NOTE — Progress Notes (Signed)
[facility]                                                                              Patient Demographics  Miguel [name], is a 64 y.o. male, DOB - May 04, 1951, ZOX:[id number]  Admit date - 10/14/2014   Admitting Physician Floydene Flock, MD  Outpatient Primary MD for the patient is [facility]  LOS - 1   Chief Complaint  Patient presents with  . Seizures      Interim history 64 year old male with history of homelessness, alcohol abuse, seizure disorder presented after being found down. Patient was confused when EMS arrived. He has not been on his Dilantin due to homelessness and an inability to afford medication. In the emergency department, patient was afebrile, Dilantin level is less than 2.5, lactate was 5.3. Patient was given IV Dilantin load. Neurology was consulted and appreciated.  Assessment & Plan   Seizure disorder/encephalopathy -Likely secondary to noncompliance with Dilantin and alcohol abuse -Encephalopathy resolved -UDS upon admission was negative, alcohol level less than 5 -CT head: No evidence of acute intracranial abnormality; stable right occipital ventriculostomy catheter -MRI brain: No acute intracranial findings -Patient did receive Dilantin load -Neurology consultation appreciated -Patient has not had any symptoms or episodes since admission -Social work case management consulted for medication and placement assistance -Continue Ativan when necessary, Dilantin 300 mg by mouth daily at bedtime.  Alcohol abuse -Alcohol level was within normal limits upon admission -Continue CIWA protocol  -Continue thiamine, multivitamin, folic acid.  Ambulatory dysfunction and debility -Physical therapy consulted and appreciated, pending evaluation  Homelessness  -Case manager and social worker consulted  Hypertension -Currently on no medications, will continue to monitor  Lactic acidosis -Patient had elevated lactic acid upon admission however was  given fluids, lactic acid resolved and 0.77  Code Status: Full  Family Communication: None at bedside.  Disposition Plan: Admit, pending PT eval  Time Spent in minutes   30 minutes  Procedures  None  Consults   Neurology  DVT Prophylaxis  Heparin  Lab Results  Component Value Date   PLT 192 10/16/2014    Medications  Scheduled Meds: . folic acid  1 mg Oral Daily  . heparin  5,000 Units Subcutaneous 3 times per day  . LORazepam  0-4 mg Intravenous Q6H   Followed by  . [START ON 10/17/2014] LORazepam  0-4 mg Intravenous Q12H  . multivitamin with minerals  1 tablet Oral Daily  . phenytoin  300 mg Oral QHS  . thiamine  100 mg Oral Daily   Or  . thiamine  100 mg Intravenous Daily   Continuous Infusions: . sodium chloride 75 mL/hr at 10/15/14 1252   PRN Meds:.LORazepam **OR** LORazepam  Antibiotics    Anti-infectives    None      Subjective:   [name] seen and examined today.  Patient denies chest pain or shortness of breath, abdominal pain.  He states he lives with his girlfriend.  He does not remember having a seizure.  Objective:   Filed Vitals:   10/15/14 1413 10/15/14 2100 10/16/14 0154 10/16/14 0617  BP: 162/92 166/93 135/84 140/93  Pulse: 61 72 83 69  Temp:  98.2 F (36.8 C) 98.4 F (36.9 C) 98.5 F (36.9 C) 98.4 F (36.9 C)  TempSrc: Oral Oral Oral Oral  Resp: SpO2: 97% 98% 96% 100%    Wt Readings from Last 3 Encounters:  09/08/14 67.858 kg (149 lb 9.6 oz)  07/16/14 68.04 kg (150 lb)  01/20/14 67.132 kg (148 lb)     Intake/Output Summary (Last 24 hours) at 10/16/14 1133 Last data filed at 10/16/14 1000  Gross per 24 hour  Intake 2463.75 ml  Output   1800 ml  Net 663.75 ml    Exam  General: Well developed, well nourished, NAD, appears stated age  HEENT: NCAT, mucous membranes moist.   Cardiovascular: S1 S2 auscultated, RRR  Respiratory: Clear to auscultation bilaterally   Abdomen: Soft, nontender,  nondistended, + bowel sounds  Extremities: warm dry without cyanosis clubbing. Lower extremity edema   Neuro: AAOx3, nonfocal  Psych: Appropriate  Data Review   Micro Results No results found for this or any previous visit (from the past 240 hour(s)).  Radiology Reports Ct Head Wo Contrast  10/14/2014   CLINICAL DATA:  Found down, EtOH, possible seizure  EXAM: CT HEAD WITHOUT CONTRAST  TECHNIQUE: Contiguous axial images were obtained from the base of the skull through the vertex without intravenous contrast.  COMPARISON:  09/07/2014  FINDINGS: Stable encephalomalacic changes involving the bilateral frontal lobes, right greater than left, with associated dystrophic calcifications on the right. Additional encephalomalacic changes involving the right temporal lobe.  Overlying small chronic left frontal extra-axial collection (series 2/image 16).  Right occipital approach ventriculostomy catheter in stable position, without ventriculomegaly.  No mass lesion, mass effect, or midline shift.  No CT evidence of acute infarction.  Moderate mucosal thickening in the left maxillary sinus. Mild mucosal thickening in the right maxillary sinus. Partial opacification of the bilateral frontal and ethmoid sinuses. Mastoid air cells are clear.  No evidence of calvarial fracture.  IMPRESSION: No evidence of acute intracranial abnormality.  Chronic changes involving the bilateral frontal and right temporal lobes, as above. Overlying small chronic left frontal extra-axial collection.  Stable right occipital approach ventriculostomy catheter, without ventriculomegaly.   Electronically Signed   By: Charline Bills M.D.   On: 10/14/2014 16:33   Mr Brain Wo Contrast  10/15/2014   CLINICAL DATA:  ETOH abuse with seizure disorder. Found down on 10/14/2014.  EXAM: MRI HEAD WITHOUT CONTRAST  TECHNIQUE: Multiplanar, multiecho pulse sequences of the brain and surrounding structures were obtained without intravenous contrast.   COMPARISON:  CT head 10/14/2014.  MR head 10/14/2009.  FINDINGS: There is considerable artifact from the patient's ventriculoperitoneal shunt.  Within limits of visualization due to susceptibility, no restricted diffusion to suggest acute infarction. Generalized atrophy with chronic microvascular ischemic change. VP shunt catheter lies within the RIGHT lateral ventricle from posterior approach. Extensive encephalomalacia of the RIGHT greater than LEFT frontal lobes and BILATERAL temporal tips. Extensive gliosis of the BILATERAL frontal white matter.  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 Scattered BILATERAL lacunar infarcts, most notable in the LEFT thalamus. Mineralization related to remote infarction or hematoma RIGHT frontal lobe, stable. Chronic LEFT mastoid disease. Chronic paranasal sinus disease.  IMPRESSION: Within limits of assessment given the ventriculoperitoneal shunt, no acute intracranial findings.  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   Electronically Signed   By: Davonna Belling  M.D.   On: 10/15/2014 11:24    CBC  Recent Labs Lab 10/14/14 1502 10/15/14 0316 10/16/14 0454  WBC 9.1 6.3 4.8  HGB 13.7 12.0* 12.5*  HCT 42.2 37.3* 38.8*  PLT 198 181 192  MCV 95.9 95.6 96.3  MCH 31.1 30.8 31.0  MCHC 32.5 32.2 32.2  RDW 15.2 15.6* 15.6*  LYMPHSABS  --  1.8 2.0  MONOABS  --  0.8 0.7  EOSABS  --  0.1 0.1  BASOSABS  --  0.0 0.0    Chemistries   Recent Labs Lab 10/14/14 1502 10/15/14 0316 10/16/14 0454  NA 144 143 140  K 4.8 3.9 3.8  CL 107 110 108  CO2 18* 24 23  GLUCOSE 81 90 94  BUN 24* 13 10  CREATININE 1.11 1.00 0.97  CALCIUM 8.8 8.4 8.3*  MG  --  1.8  --   AST 38* 28 27  ALT 27 21 19   ALKPHOS 55 47 50  BILITOT 0.8 1.2 0.6    ------------------------------------------------------------------------------------------------------------------ CrCl cannot be calculated (Unknown ideal weight.). ------------------------------------------------------------------------------------------------------------------ No results for input(s): HGBA1C in the last 72 hours. ------------------------------------------------------------------------------------------------------------------ No results for input(s): CHOL, HDL, LDLCALC, TRIG, CHOLHDL, LDLDIRECT in the last 72 hours. ------------------------------------------------------------------------------------------------------------------ No results for input(s): TSH, T4TOTAL, T3FREE, THYROIDAB in the last 72 hours.  Invalid input(s): FREET3 ------------------------------------------------------------------------------------------------------------------ No results for input(s): VITAMINB12, FOLATE, FERRITIN, TIBC, IRON, RETICCTPCT in the last 72 hours.  Coagulation profile No results for input(s): INR, PROTIME in the last 168 hours.  No results for input(s): DDIMER in the last 72 hours.  Cardiac Enzymes No results for input(s): CKMB, TROPONINI, MYOGLOBIN in the last 168 hours.  Invalid input(s): CK ------------------------------------------------------------------------------------------------------------------ Invalid input(s): POCBNP    Scheryl Sanborn D.O. on 10/16/2014 at 11:33 AM  Between 7am to 7pm - Pager - [phone]  After 7pm go to [email or web address] - password [email or web address]  And look for the night coverage person covering for me after hours  [facility] Office  [phone]

## 2014-10-17 DIAGNOSIS — R4182 Altered mental status, unspecified: Secondary | ICD-10-CM

## 2014-10-17 DIAGNOSIS — G9340 Encephalopathy, unspecified: Secondary | ICD-10-CM

## 2014-10-17 LAB — COMPREHENSIVE METABOLIC PANEL
ALT: 17 U/L (ref 0–53)
AST: 20 U/L (ref 0–37)
Albumin: 3.7 g/dL (ref 3.5–5.2)
Alkaline Phosphatase: 55 U/L (ref 39–117)
Anion gap: 7 (ref 5–15)
BUN: 9 mg/dL (ref 6–23)
CALCIUM: 8.4 mg/dL (ref 8.4–10.5)
CO2: 23 mmol/L (ref 19–32)
Chloride: 106 mmol/L (ref 96–112)
Creatinine, Ser: 0.9 mg/dL (ref 0.50–1.35)
GFR calc non Af Amer: 89 mL/min — ABNORMAL LOW (ref >90)
Glucose, Bld: 99 mg/dL (ref 70–99)
Potassium: 3.9 mmol/L (ref 3.5–5.1)
SODIUM: 136 mmol/L (ref 135–145)
TOTAL PROTEIN: 6.9 g/dL (ref 6.0–8.3)
Total Bilirubin: 0.6 mg/dL (ref 0.3–1.2)

## 2014-10-17 LAB — CBC WITH DIFFERENTIAL/PLATELET
BASOS PCT: 1 % (ref 0–1)
Basophils Absolute: 0 10*3/uL (ref 0.0–0.1)
EOS ABS: 0.3 10*3/uL (ref 0.0–0.7)
Eosinophils Relative: 4 % (ref 0–5)
HCT: 42.9 % (ref 39.0–52.0)
HEMOGLOBIN: 14.3 g/dL (ref 13.0–17.0)
Lymphocytes Relative: 39 % (ref 12–46)
Lymphs Abs: 2.3 10*3/uL (ref 0.7–4.0)
MCH: 32.1 pg (ref 26.0–34.0)
MCHC: 33.3 g/dL (ref 30.0–36.0)
MCV: 96.2 fL (ref 78.0–100.0)
MONO ABS: 0.8 10*3/uL (ref 0.1–1.0)
MONOS PCT: 14 % — AB (ref 3–12)
NEUTROS PCT: 42 % — AB (ref 43–77)
Neutro Abs: 2.6 10*3/uL (ref 1.7–7.7)
Platelets: 176 10*3/uL (ref 150–400)
RBC: 4.46 MIL/uL (ref 4.22–5.81)
RDW: 15.4 % (ref 11.5–15.5)
WBC: 6 10*3/uL (ref 4.0–10.5)

## 2014-10-17 NOTE — Progress Notes (Signed)
Physical Therapy Treatment Patient Details Name: Miguel DohenyRay J [name] MRN: [id number] DOB: [birth year] Today's Date: 10/17/2014    History of Present Illness Admitted with AMS after being found on side of road.    PT Comments    Pt OOB in recliner feeling "good".  Amb in hallway a great distance with his personal straight cane with no LOB and functional speed.    Follow Up Recommendations  Home health PT;SNF (pending )     Equipment Recommendations       Recommendations for Other Services       Precautions / Restrictions Precautions Precautions: Fall Restrictions Weight Bearing Restrictions: No    Mobility  Bed Mobility Overal bed mobility: Modified Independent                Transfers Overall transfer level: Needs assistance Equipment used: None Transfers: Sit to/from Stand Sit to Stand: Supervision;Min guard         General transfer comment: good use of hands to steady self   Ambulation/Gait Ambulation/Gait assistance: Min guard;Min assist Ambulation Distance (Feet): 385 Feet Assistive device: Straight cane Gait Pattern/deviations: Step-through pattern Gait velocity: WFL   General Gait Details: amb with his straight cane and did well.  No LOB and functional speed.  Pt declined any suggesstion for a walker.    Stairs            Wheelchair Mobility    Modified Rankin (Stroke Patients Only)       Balance                                    Cognition Arousal/Alertness: Awake/alert Behavior During Therapy: WFL for tasks assessed/performed Overall Cognitive Status: Within Functional Limits for tasks assessed                      Exercises      General Comments        Pertinent Vitals/Pain Pain Assessment: No/denies pain    Home Living                      Prior Function            PT Goals (current goals can now be found in the care plan section) Progress towards PT goals: Progressing toward  goals    Frequency  Min 3X/week    PT Plan      Co-evaluation             End of Session Equipment Utilized During Treatment: Gait belt Activity Tolerance: Patient tolerated treatment well       Time: 0920-0932 PT Time Calculation (min) (ACUTE ONLY): 12 min  Charges:  $Gait Training: 8-22 mins                    G Codes:      Felecia ShellingLori Reid Regas  PTA [facility]  Acute  Rehab Pager      [phone]

## 2014-10-17 NOTE — Progress Notes (Signed)
Clinical Social Work Department CLINICAL SOCIAL WORK PLACEMENT NOTE 10/17/2014  Patient:  Miguel [name],Miguel [name]  Account Number:  [id number] Admit date:  10/14/2014  Clinical Social Worker:  Cori RazorJAMIE Kylinn Shropshire, LCSW  Date/time:  10/17/2014 03:18 PM  Clinical Social Work is seeking post-discharge placement for this patient at the following level of care:   SKILLED NURSING   (*CSW will update this form in Epic as items are completed)   10/17/2014  [facility]family provided with [facility]'s list of facilities offering this level of care within the geographic area requested by the patient (or if unable, by the patient's family).  10/17/2014  Patient/family informed of their freedom to choose among providers that offer the needed level of care, that participate in Medicare, Medicaid or managed care program needed by the patient, have an available bed and are willing to accept the patient.    Patient/family informed of [facility]' ownership interest in [facility], as well as of the fact that they are under no obligation to receive care at this facility.  PASARR submitted to EDS on 10/17/2014 PASARR number received on 10/17/2014  FL2 transmitted to all facilities in geographic area requested by pt/family on  10/17/2014 FL2 transmitted to all facilities within larger geographic area on   Patient informed that his/her managed care company has contracts with or will negotiate with  certain facilities, including the following:     Patient/family informed of bed offers received:   Patient chooses bed at  Physician recommends and patient chooses bed at    Patient to be transferred to  on   Patient to be transferred to facility by  Patient and family notified of transfer on  Name of family member notified:    The following physician request were entered in Epic:   Additional Comments:  Cori RazorJamie Gilberta Peeters LCSW [phone]

## 2014-10-17 NOTE — Progress Notes (Signed)
Clinical Social Work Department BRIEF PSYCHOSOCIAL ASSESSMENT 10/17/2014  Patient:  Miguel [name], Miguel [name]     Account Number:  [id number]     Admit date:  10/14/2014  Clinical Social Worker:  Lacie Scotts  Date/Time:  10/17/2014 03:07 PM  Referred by:  Physician  Date Referred:  10/17/2014 Referred for  SNF Placement   Other Referral:   Interview type:   Other interview type:    PSYCHOSOCIAL DATA Living Status:  OTHER Admitted from facility:   Level of care:   Primary support name:  [place] Primary support relationship to patient:  FRIEND Degree of support available:   unclear    CURRENT CONCERNS Current Concerns  Post-Acute Placement   Other Concerns:    SOCIAL WORK ASSESSMENT / PLAN Pt is a 64 yr old gentleman hospitalized on 10/14/14 with a dx of encephalopathy. CSW met with pt to assist with d/c planning. PN reviewed. Pt states he is not homeless he lives with his girlfriend. CSW is unable to confirm this. PT has recommend [facility]. Pt is in agreement with this plan. SNF search has been initiated and bed offers available. CSW will followed to assist with d/c planning to SNF.   Assessment/plan status:  Psychosocial Support/Ongoing Assessment of Needs Other assessment/ plan:   Information/referral to community resources:   Insurance coverage for SNF and ambulance transport reviewed.    PATIENT'S/FAMILY'S RESPONSE TO PLAN OF CARE: " I'll go to a rehab ". Pt is calm and cooperative.    Werner Lean LCSW [phone]

## 2014-10-17 NOTE — Progress Notes (Signed)
[facility]                                                                              Patient Demographics  Miguel [name], is a 64 y.o. male, DOB - 07-25-1951, ZOX:[id number]  Admit date - 10/14/2014   Admitting Physician Floydene Flock, MD  Outpatient Primary MD for the patient is [facility]  LOS - 2   Chief Complaint  Patient presents with  . Seizures      Interim history 64 year old male with history of homelessness, alcohol abuse, seizure disorder presented after being found down. Patient was confused when EMS arrived. He has not been on his Dilantin due to homelessness and an inability to afford medication. In the emergency department, patient was afebrile, Dilantin level is less than 2.5, lactate was 5.3. Patient was given IV Dilantin load. Neurology was consulted and appreciated. Patient not having further seizure episodes. Plan to transition to SNF in the next 24 hours.   Assessment & Plan   Seizure disorder/encephalopathy -Likely secondary to noncompliance with Dilantin and alcohol abuse -Encephalopathy resolved -UDS upon admission was negative, alcohol level less than 5 -CT head: No evidence of acute intracranial abnormality; stable right occipital ventriculostomy catheter -MRI brain: No acute intracranial findings -Patient did receive Dilantin load -Neurology consultation appreciated -Patient has not had any symptoms or episodes since admission -Social work case management consulted for medication and placement assistance -Continue Ativan when necessary, Dilantin 300 mg by mouth daily at bedtime.  Alcohol abuse -Alcohol level was within normal limits upon admission -Continue CIWA protocol  -Continue thiamine, multivitamin, folic acid.  Ambulatory dysfunction and debility -Physical therapy consulted -Plan for d/c to SNF  Homelessness  -Case manager and social worker consulted  Hypertension -Currently on no medications, will continue to  monitor  Lactic acidosis -Patient had elevated lactic acid upon admission however was given fluids, lactic acid resolved and 0.77  Code Status: Full  Family Communication: None at bedside.  Disposition Plan: Plan for discharge to SNF in the next 24 hours  Time Spent in minutes   20 minutes  Procedures  None  Consults   Neurology  DVT Prophylaxis  Heparin  Lab Results  Component Value Date   PLT 176 10/17/2014    Medications  Scheduled Meds: . folic acid  1 mg Oral Daily  . heparin  5,000 Units Subcutaneous 3 times per day  . LORazepam  0-4 mg Intravenous Q12H  . multivitamin with minerals  1 tablet Oral Daily  . phenytoin  300 mg Oral QHS  . thiamine  100 mg Oral Daily   Or  . thiamine  100 mg Intravenous Daily   Continuous Infusions: . sodium chloride 75 mL/hr at 10/17/14 0517   PRN Meds:.LORazepam **OR** LORazepam  Antibiotics    Anti-infectives    None      Subjective:   Miguel [name] seen and examined today.  He was laying comfortably in bed, has no complaints for me. Seemsmildly sedated. Denies any pain   Objective:   Filed Vitals:   10/17/14 0500 10/17/14 1004 10/17/14 1211 10/17/14 1403  BP: 185/83 117/78 134/75 123/92  Pulse: 66 78 80 63  Temp:  97.6 F (36.4 C) 97.5 F (36.4 C) 97.6 F (36.4 C) 97.5 F (36.4 C)  TempSrc: Oral Oral Oral Oral  Resp: 18 15 12 15   SpO2: 98% 96% 95% 100%    Wt Readings from Last 3 Encounters:  09/08/14 67.858 kg (149 lb 9.6 oz)  07/16/14 68.04 kg (150 lb)  01/20/14 67.132 kg (148 lb)     Intake/Output Summary (Last 24 hours) at 10/17/14 1756 Last data filed at 10/17/14 1405  Gross per 24 hour  Intake 2678.75 ml  Output   2825 ml  Net -146.25 ml    Exam  General: Well developed, well nourished, NAD, appears stated age  HEENT: NCAT, mucous membranes moist.   Cardiovascular: S1 S2 auscultated, RRR  Respiratory: Clear to auscultation bilaterally   Abdomen: Soft, nontender, nondistended, +  bowel sounds  Extremities: warm dry without cyanosis clubbing. Lower extremity edema   Neuro: AAOx3, nonfocal  Psych: Appropriate  Data Review   Micro Results No results found for this or any previous visit (from the past 240 hour(s)).  Radiology Reports Ct Head Wo Contrast  10/14/2014   CLINICAL DATA:  Found down, EtOH, possible seizure  EXAM: CT HEAD WITHOUT CONTRAST  TECHNIQUE: Contiguous axial images were obtained from the base of the skull through the vertex without intravenous contrast.  COMPARISON:  09/07/2014  FINDINGS: Stable encephalomalacic changes involving the bilateral frontal lobes, right greater than left, with associated dystrophic calcifications on the right. Additional encephalomalacic changes involving the right temporal lobe.  Overlying small chronic left frontal extra-axial collection (series 2/image 16).  Right occipital approach ventriculostomy catheter in stable position, without ventriculomegaly.  No mass lesion, mass effect, or midline shift.  No CT evidence of acute infarction.  Moderate mucosal thickening in the left maxillary sinus. Mild mucosal thickening in the right maxillary sinus. Partial opacification of the bilateral frontal and ethmoid sinuses. Mastoid air cells are clear.  No evidence of calvarial fracture.  IMPRESSION: No evidence of acute intracranial abnormality.  Chronic changes involving the bilateral frontal and right temporal lobes, as above. Overlying small chronic left frontal extra-axial collection.  Stable right occipital approach ventriculostomy catheter, without ventriculomegaly.   Electronically Signed   By: Charline BillsSriyesh  Krishnan M.D.   On: 10/14/2014 16:33   Mr Brain Wo Contrast  10/15/2014   CLINICAL DATA:  ETOH abuse with seizure disorder. Found down on 10/14/2014.  EXAM: MRI HEAD WITHOUT CONTRAST  TECHNIQUE: Multiplanar, multiecho pulse sequences of the brain and surrounding structures were obtained without intravenous contrast.  COMPARISON:  CT  head 10/14/2014.  MR head 10/14/2009.  FINDINGS: There is considerable artifact from the patient's ventriculoperitoneal shunt.  Within limits of visualization due to susceptibility, no restricted diffusion to suggest acute infarction. Generalized atrophy with chronic microvascular ischemic change. VP shunt catheter lies within the RIGHT lateral ventricle from posterior approach. Extensive encephalomalacia of the RIGHT greater than LEFT frontal lobes and BILATERAL temporal tips. Extensive gliosis of the BILATERAL frontal white matter.  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 Scattered BILATERAL lacunar infarcts, most notable in the LEFT thalamus. Mineralization related to remote infarction or hematoma RIGHT frontal lobe, stable. Chronic LEFT mastoid disease. Chronic paranasal sinus disease.  IMPRESSION: Within limits of assessment given the ventriculoperitoneal shunt, no acute intracranial findings.  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   Electronically Signed   By: Davonna BellingJohn  Curnes  M.D.   On: 10/15/2014 11:24    CBC  Recent Labs Lab 10/14/14 1502 10/15/14 0316 10/16/14 0454 10/17/14 0756  WBC 9.1 6.3 4.8 6.0  HGB 13.7 12.0* 12.5* 14.3  HCT 42.2 37.3* 38.8* 42.9  PLT 198 181 192 176  MCV 95.9 95.6 96.3 96.2  MCH 31.1 30.8 31.0 32.1  MCHC 32.5 32.2 32.2 33.3  RDW 15.2 15.6* 15.6* 15.4  LYMPHSABS  --  1.8 2.0 2.3  MONOABS  --  0.8 0.7 0.8  EOSABS  --  0.1 0.1 0.3  BASOSABS  --  0.0 0.0 0.0    Chemistries   Recent Labs Lab 10/14/14 1502 10/15/14 0316 10/16/14 0454 10/17/14 0756  NA 144 143 140 136  K 4.8 3.9 3.8 3.9  CL 107 110 108 106  CO2 18* GLUCOSE 81 90 94 99  BUN 24* CREATININE 1.11 1.00 0.97 0.90  CALCIUM 8.8 8.4 8.3* 8.4  MG  --  1.8  --   --   AST 38* ALT ALKPHOS 55 47 50 55  BILITOT 0.8 1.2 0.6 0.6   ------------------------------------------------------------------------------------------------------------------ CrCl cannot be calculated (Unknown ideal weight.). ------------------------------------------------------------------------------------------------------------------ No results for input(s): HGBA1C in the last 72 hours. ------------------------------------------------------------------------------------------------------------------ No results for input(s): CHOL, HDL, LDLCALC, TRIG, CHOLHDL, LDLDIRECT in the last 72 hours. ------------------------------------------------------------------------------------------------------------------ No results for input(s): TSH, T4TOTAL, T3FREE, THYROIDAB in the last 72 hours.  Invalid input(s): FREET3 ------------------------------------------------------------------------------------------------------------------ No results for input(s): VITAMINB12, FOLATE, FERRITIN, TIBC, IRON, RETICCTPCT in the last 72 hours.  Coagulation profile No results for input(s): INR, PROTIME in the last 168 hours.  No results for input(s): DDIMER in the last 72 hours.  Cardiac Enzymes No results for input(s): CKMB, TROPONINI, MYOGLOBIN in the last 168 hours.  Invalid input(s): CK ------------------------------------------------------------------------------------------------------------------ Invalid input(s): POCBNP    Muslima Toppins D.O. on 10/17/2014 at 5:56 PM  Between 7am to 7pm - Pager - [phone]  After 7pm go to [email or web address] - password [email or web address]  And look for the night coverage person covering for me after hours  [facility] Office  [phone]

## 2014-10-18 DIAGNOSIS — G40909 Epilepsy, unspecified, not intractable, without status epilepticus: Secondary | ICD-10-CM

## 2014-10-18 DIAGNOSIS — I10 Essential (primary) hypertension: Secondary | ICD-10-CM

## 2014-10-18 LAB — COMPREHENSIVE METABOLIC PANEL
ALK PHOS: 52 U/L (ref 39–117)
ALT: 14 U/L (ref 0–53)
AST: 16 U/L (ref 0–37)
Albumin: 3.3 g/dL — ABNORMAL LOW (ref 3.5–5.2)
Anion gap: 9 (ref 5–15)
BUN: 12 mg/dL (ref 6–23)
CHLORIDE: 107 mmol/L (ref 96–112)
CO2: 22 mmol/L (ref 19–32)
Calcium: 8.5 mg/dL (ref 8.4–10.5)
Creatinine, Ser: 0.91 mg/dL (ref 0.50–1.35)
GFR calc Af Amer: >90 mL/min (ref >90)
GFR, EST NON AFRICAN AMERICAN: 88 mL/min — AB (ref >90)
Glucose, Bld: 95 mg/dL (ref 70–99)
Potassium: 3.8 mmol/L (ref 3.5–5.1)
SODIUM: 138 mmol/L (ref 135–145)
Total Bilirubin: 0.6 mg/dL (ref 0.3–1.2)
Total Protein: 6.7 g/dL (ref 6.0–8.3)

## 2014-10-18 LAB — CBC WITH DIFFERENTIAL/PLATELET
BASOS ABS: 0 10*3/uL (ref 0.0–0.1)
BASOS PCT: 0 % (ref 0–1)
Eosinophils Absolute: 0.3 10*3/uL (ref 0.0–0.7)
Eosinophils Relative: 6 % — ABNORMAL HIGH (ref 0–5)
HEMATOCRIT: 42.1 % (ref 39.0–52.0)
Hemoglobin: 13.7 g/dL (ref 13.0–17.0)
LYMPHS PCT: 42 % (ref 12–46)
Lymphs Abs: 2.2 10*3/uL (ref 0.7–4.0)
MCH: 31.4 pg (ref 26.0–34.0)
MCHC: 32.5 g/dL (ref 30.0–36.0)
MCV: 96.3 fL (ref 78.0–100.0)
Monocytes Absolute: 0.6 10*3/uL (ref 0.1–1.0)
Monocytes Relative: 11 % (ref 3–12)
NEUTROS ABS: 2.2 10*3/uL (ref 1.7–7.7)
Neutrophils Relative %: 41 % — ABNORMAL LOW (ref 43–77)
PLATELETS: 200 10*3/uL (ref 150–400)
RBC: 4.37 MIL/uL (ref 4.22–5.81)
RDW: 15.3 % (ref 11.5–15.5)
WBC: 5.3 10*3/uL (ref 4.0–10.5)

## 2014-10-18 NOTE — Discharge Summary (Signed)
Physician Discharge Summary  Miguel [name]:[id number] DOB: [birth year] DOA: 10/14/2014  PCP: [facility]  Admit date: 10/14/2014 Discharge date: 10/18/2014  Time spent:  Recommendations for Outpatient Follow-up:  1. Dilantin level in 1 wk -adjust dose as needed  Discharge Condition: stable Diet recommendation: heart healthy  Discharge Diagnoses:  Principal Problem:   Seizure disorder Active Problems:   Acute encephalopathy Mild Essential HTN  History of present illness:  64 year old male with history of alcohol abuse, seizure disorder presented after being found down. Patient was confused when EMS arrived. He has not been on his Dilantin due to homelessness and an inability to afford medication. In the emergency department, patient was afebrile, Dilantin level is less than 2.5, lactate was 5.3. Patient was given IV Dilantin load. Neurology was consulted and appreciated.  Hospital Course:  Seizure disorder/encephalopathy -Likely secondary to noncompliance with Dilantin and alcohol abuse- states he was short of money and could not afford it -Encephalopathy resolved -UDS upon admission was negative, alcohol level less than 5 -CT head: No evidence of acute intracranial abnormality; stable right occipital ventriculostomy catheter -MRI brain: No acute intracranial findings -Patient did receive Dilantin load -Neurology consultation appreciated- recommended to continue Dilantin 300 mg by mouth daily at bedtime. -Patient has not had any symptoms or episodes since admission  Alcohol abuse -Alcohol level was within normal limits upon admission- he states he does not drink daily- (he drinks about 2 x a week),  which is why he has not had any withdrawal symptoms- was watched on CIWA protocol   Ambulatory dysfunction and debility -Physical therapy consulted -Plan for d/c to SNF  Homelessness ?? - states he has been living with a girlfriend for 3 yrs and is therefore not  homeless as was originally presumed   Hypertension - very mild  -Currently on no medications- needs to be addressed by PCP - as he is non-compliant with meds, I am in no rush to add a second medication to he regimen   Lactic acidosis -Patient had elevated lactic acid upon admission however was given fluids, lactic acid resolved and 0.77  Discharge Exam: There were no vitals filed for this visit. Filed Vitals:   10/18/14 0534  BP: 131/93  Pulse: 80  Temp: 97.7 F (36.5 C)  Resp: 16    General: AAO x 3, no distress Cardiovascular: RRR, no murmurs  Respiratory: clear to auscultation bilaterally GI: soft, non-tender, non-distended, bowel sound positive  Discharge Instructions You were cared for by a hospitalist during your hospital stay. If you have any questions about your discharge medications or the care you received while you were in the hospital after you are discharged, you can call the unit and asked to speak with the hospitalist on call if the hospitalist that took care of you is not available. Once you are discharged, your primary care physician will handle any further medical issues. Please note that NO REFILLS for any discharge medications will be authorized once you are discharged, as it is imperative that you return to your primary care physician (or establish a relationship with a primary care physician if you do not have one) for your aftercare needs so that they can reassess your need for medications and monitor your lab values.      Discharge Instructions    Diet - low sodium heart healthy    Complete by:  As directed      Increase activity slowly    Complete by:  As directed  Medication List    TAKE these medications        phenytoin 300 MG ER capsule  Commonly known as:  DILANTIN  Take 1 capsule (300 mg total) by mouth at bedtime.       No Known Allergies Follow-up Information    Follow up with [facility].   Specialty:  Internal  Medicine   Why:  This is your medicaid [place] assigned primary care provider.  If this is not a pcp you prefer call DSS at [phone] for assistance    Contact information:   [address] [place] [address] [address] [phone]        The results of significant diagnostics from this hospitalization (including imaging, microbiology, ancillary and laboratory) are listed below for reference.    Significant Diagnostic Studies: Ct Head Wo Contrast  10/14/2014   CLINICAL DATA:  Found down, EtOH, possible seizure  EXAM: CT HEAD WITHOUT CONTRAST  TECHNIQUE: Contiguous axial images were obtained from the base of the skull through the vertex without intravenous contrast.  COMPARISON:  09/07/2014  FINDINGS: Stable encephalomalacic changes involving the bilateral frontal lobes, right greater than left, with associated dystrophic calcifications on the right. Additional encephalomalacic changes involving the right temporal lobe.  Overlying small chronic left frontal extra-axial collection (series 2/image 16).  Right occipital approach ventriculostomy catheter in stable position, without ventriculomegaly.  No mass lesion, mass effect, or midline shift.  No CT evidence of acute infarction.  Moderate mucosal thickening in the left maxillary sinus. Mild mucosal thickening in the right maxillary sinus. Partial opacification of the bilateral frontal and ethmoid sinuses. Mastoid air cells are clear.  No evidence of calvarial fracture.  IMPRESSION: No evidence of acute intracranial abnormality.  Chronic changes involving the bilateral frontal and right temporal lobes, as above. Overlying small chronic left frontal extra-axial collection.  Stable right occipital approach ventriculostomy catheter, without ventriculomegaly.   Electronically Signed   By: Charline Bills M.D.   On: 10/14/2014 16:33   Mr Brain Wo Contrast  10/15/2014   CLINICAL DATA:  ETOH abuse with seizure disorder. Found down on 10/14/2014.  EXAM:  MRI HEAD WITHOUT CONTRAST  TECHNIQUE: Multiplanar, multiecho pulse sequences of the brain and surrounding structures were obtained without intravenous contrast.  COMPARISON:  CT head 10/14/2014.  MR head 10/14/2009.  FINDINGS: There is considerable artifact from the patient's ventriculoperitoneal shunt.  Within limits of visualization due to susceptibility, no restricted diffusion to suggest acute infarction. Generalized atrophy with chronic microvascular ischemic change. VP shunt catheter lies within the RIGHT lateral ventricle from posterior approach. Extensive encephalomalacia of the RIGHT greater than LEFT frontal lobes and BILATERAL temporal tips. Extensive gliosis of the BILATERAL frontal white matter.  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 Scattered BILATERAL lacunar infarcts, most notable in the LEFT thalamus. Mineralization related to remote infarction or hematoma RIGHT frontal lobe, stable. Chronic LEFT mastoid disease. Chronic paranasal sinus disease.  IMPRESSION: Within limits of assessment given the ventriculoperitoneal shunt, no acute intracranial findings.  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   Electronically Signed   By: Davonna Belling M.D.   On: 10/15/2014 11:24    Microbiology: No results found for this or any previous visit (from the past 240 hour(s)).   Labs: Basic Metabolic Panel:  Recent Labs Lab 10/14/14 1502 10/15/14 0316 10/16/14 0454 10/17/14 0756 10/18/14 0600  NA 144 143 140 136 138  K 4.8 3.9 3.8 3.9 3.8  CL 107 110 108 106 107  CO2 18* 24 23 23 22   GLUCOSE 81 90 94 99 95  BUN 24* 13 10 9 12   CREATININE 1.11 1.00 0.97 0.90 0.91  CALCIUM 8.8 8.4 8.3* 8.4 8.5  MG  --  1.8  --   --   --    Liver Function Tests:  Recent Labs Lab 10/14/14 1502 10/15/14 0316  10/16/14 0454 10/17/14 0756 10/18/14 0600  AST 38* 28 27 20 16   ALT 27 21 19 17 14   ALKPHOS 55 47 50 55 52  BILITOT 0.8 1.2 0.6 0.6 0.6  PROT 7.6 6.5 6.5 6.9 6.7  ALBUMIN 3.8 3.3* 3.4* 3.7 3.3*   No results for input(s): LIPASE, AMYLASE in the last 168 hours. No results for input(s): AMMONIA in the last 168 hours. CBC:  Recent Labs Lab 10/14/14 1502 10/15/14 0316 10/16/14 0454 10/17/14 0756 10/18/14 0600  WBC 9.1 6.3 4.8 6.0 5.3  NEUTROABS  --  3.5 2.0 2.6 2.2  HGB 13.7 12.0* 12.5* 14.3 13.7  HCT 42.2 37.3* 38.8* 42.9 42.1  MCV 95.9 95.6 96.3 96.2 96.3  PLT 198 181 192 176 200   Cardiac Enzymes: No results for input(s): CKTOTAL, CKMB, CKMBINDEX, TROPONINI in the last 168 hours. BNP: BNP (last 3 results) No results for input(s): BNP in the last 8760 hours.  ProBNP (last 3 results) No results for input(s): PROBNP in the last 8760 hours.  CBG:  Recent Labs Lab 10/14/14 1518 10/14/14 1600  GLUCAP 69* 145*       SignedCalvert Cantor:  Wilbert Hayashi, MD [facility] 10/18/2014, 9:46 AM

## 2014-10-18 NOTE — Discharge Instructions (Signed)
Patient took Folic tab, Multivitamin and thiamin as MD ordered it here at hospital.

## 2014-10-18 NOTE — Progress Notes (Signed)
Clinical Social Work Department CLINICAL SOCIAL WORK PLACEMENT NOTE 10/18/2014  Patient:  Miguel [name],Miguel [name]  Account Number:  [id number] Admit date:  10/14/2014  Clinical Social Worker:  Cori RazorJAMIE Tacoya Altizer, LCSW  Date/time:  10/18/2014 03:17 PM  Clinical Social Work is seeking post-discharge placement for this patient at the following level of care:   SKILLED NURSING   (*CSW will update this form in Epic as items are completed)   10/17/2014  Patient/family provided with [facility] Department of Clinical Social Work's list of facilities offering this level of care within the geographic area requested by the patient (or if unable, by the patient's family).  10/17/2014  Patient/family informed of their freedom to choose among providers that offer the needed level of care, that participate in Medicare, Medicaid or managed care program needed by the patient, have an available bed and are willing to accept the patient.    Patient/family informed of [facility]' ownership interest in [facility], as well as of the fact that they are under no obligation to receive care at this facility.  PASARR submitted to EDS on 10/17/2014 PASARR number received on 10/17/2014  FL2 transmitted to all facilities in geographic area requested by pt/family on  10/17/2014 FL2 transmitted to all facilities within larger geographic area on   Patient informed that his/her managed care company has contracts with or will negotiate with  certain facilities, including the following:     Patient/family informed of bed offers received:  10/18/2014 [facility] Physician recommends and patient chooses bed at    [facility] be [facility] on  10/18/2014 Patient to be transferred to facility by [facility] Patient and family notified of transfer on 10/18/2014 Name of family member notified:  No contact info available  The following physician request were  entered in Epic:   Additional Comments: Pt is in agreement with d/c to SNF today. [facility] transport required. NSG reviewed d/c summary and avs reviewed. No scripts for this pt.  Cori RazorJamie Rin Gorton LCSW [phone]

## 2014-10-18 NOTE — Progress Notes (Signed)
[facility] if he is stable for d/c. CSW will assist with d/c planning to SNF.  Cori RazorJamie Evany Schecter LCSW [phone]

## 2014-10-18 NOTE — Care Management Note (Signed)
    Page 1 of 1   10/18/2014     2:24:05 PM CARE MANAGEMENT NOTE 10/18/2014  Patient:  Miguel [name],Miguel [name]   Account Number:  [id number]  Date Initiated:  10/18/2014  Documentation initiated by:  [facility],Glendell Schlottman  Subjective/Objective Assessment:   adm: Seizure disorder     Action/Plan:   discharge planning   Anticipated DC Date:  10/18/2014   Anticipated DC Plan:  SKILLED NURSING FACILITY      DC Planning Services  CM consult      Choice offered to / List presented to:             Status of service:  Completed, signed off Medicare Important Message given?  NO (If response is "NO", the following Medicare IM given date fields will be blank) Date Medicare IM given:   Medicare IM given by:   Date Additional Medicare IM given:   Additional Medicare IM given by:    Discharge Disposition:  SKILLED NURSING FACILITY  Per UR Regulation:    If discussed at Long Length of Stay Meetings, dates discussed:    Comments:  10/18/14 14:22 CM notes pt to go to SNF; CSW arranged.  No other CM needs were communicated.  [name], BSN, CM [phone].

## 2015-01-26 ENCOUNTER — Emergency Department (HOSPITAL_COMMUNITY)
Admission: EM | Admit: 2015-01-26 | Discharge: 2015-01-27 | Discharge status: Against Medical Advice | Payer: Medicare Other | Attending: Emergency Medicine | Admitting: Emergency Medicine

## 2015-01-26 ENCOUNTER — Encounter (HOSPITAL_COMMUNITY): Payer: Self-pay | Admitting: *Deleted

## 2015-01-26 DIAGNOSIS — Z720 Tobacco use: Secondary | ICD-10-CM | POA: Diagnosis not present

## 2015-01-26 DIAGNOSIS — I10 Essential (primary) hypertension: Secondary | ICD-10-CM | POA: Insufficient documentation

## 2015-01-26 DIAGNOSIS — R011 Cardiac murmur, unspecified: Secondary | ICD-10-CM | POA: Insufficient documentation

## 2015-01-26 DIAGNOSIS — F10129 Alcohol abuse with intoxication, unspecified: Secondary | ICD-10-CM | POA: Diagnosis present

## 2015-01-26 NOTE — ED Notes (Signed)
Per EMS pt [place] corner of [address], [facility], a passer by found pt laying on ground, called EMS, pt was sitting on curb when EMS arrived and mumbling, pt is a/o x person and DOB, unsure of year or month, knows he's in [place], pt is intoxicated, empty alcohol bottle laying by shopping cart, 20G LFA, 100cc NS given so far out of 500cc bag, lungs clear, no signs of injury, BP 126/70, HR 65, CBG 91.

## 2015-01-26 NOTE — ED Notes (Signed)
[facility] Expected date:  Expected time:  Means of arrival:  Comments: EMS intoxicated altered mental status

## 2015-01-27 NOTE — ED Notes (Signed)
Pt ambulated to restroom w/ assistance, pt in hallway mumbling to self, confused, in no distress.

## 2015-01-27 NOTE — ED Notes (Addendum)
Pt in bathroom w/ cigarette, stating "I'm ready to go", Pt started unwrapping IV and pulling it out, RN took IV out, [facility], security and [employer] escorted pt out after IV was taken out, pt ambulated. Pt had ambulated to restroom multiple times before this episode.

## 2015-04-04 ENCOUNTER — Emergency Department (HOSPITAL_COMMUNITY)
Admission: EM | Admit: 2015-04-04 | Discharge: 2015-04-04 | Disposition: A | Discharge status: Home / Self Care | Payer: Medicare Other | Attending: Emergency Medicine | Admitting: Emergency Medicine

## 2015-04-04 ENCOUNTER — Emergency Department (HOSPITAL_COMMUNITY): Payer: Medicare Other

## 2015-04-04 ENCOUNTER — Encounter (HOSPITAL_COMMUNITY): Payer: Self-pay | Admitting: Emergency Medicine

## 2015-04-04 DIAGNOSIS — Z86718 Personal history of other venous thrombosis and embolism: Secondary | ICD-10-CM | POA: Diagnosis not present

## 2015-04-04 DIAGNOSIS — F10129 Alcohol abuse with intoxication, unspecified: Secondary | ICD-10-CM | POA: Diagnosis present

## 2015-04-04 DIAGNOSIS — F1010 Alcohol abuse, uncomplicated: Secondary | ICD-10-CM | POA: Insufficient documentation

## 2015-04-04 DIAGNOSIS — G40909 Epilepsy, unspecified, not intractable, without status epilepticus: Secondary | ICD-10-CM

## 2015-04-04 DIAGNOSIS — R011 Cardiac murmur, unspecified: Secondary | ICD-10-CM | POA: Insufficient documentation

## 2015-04-04 DIAGNOSIS — I10 Essential (primary) hypertension: Secondary | ICD-10-CM | POA: Diagnosis not present

## 2015-04-04 DIAGNOSIS — Z87828 Personal history of other (healed) physical injury and trauma: Secondary | ICD-10-CM | POA: Diagnosis not present

## 2015-04-04 DIAGNOSIS — Z8673 Personal history of transient ischemic attack (TIA), and cerebral infarction without residual deficits: Secondary | ICD-10-CM | POA: Diagnosis not present

## 2015-04-04 DIAGNOSIS — Z720 Tobacco use: Secondary | ICD-10-CM | POA: Insufficient documentation

## 2015-04-04 LAB — CBC WITH DIFFERENTIAL/PLATELET
BASOS PCT: 2 % — AB (ref 0–1)
Basophils Absolute: 0.1 10*3/uL (ref 0.0–0.1)
EOS ABS: 0.3 10*3/uL (ref 0.0–0.7)
Eosinophils Relative: 6 % — ABNORMAL HIGH (ref 0–5)
HEMATOCRIT: 33.9 % — AB (ref 39.0–52.0)
HEMOGLOBIN: 11.1 g/dL — AB (ref 13.0–17.0)
LYMPHS ABS: 2 10*3/uL (ref 0.7–4.0)
Lymphocytes Relative: 38 % (ref 12–46)
MCH: 30.6 pg (ref 26.0–34.0)
MCHC: 32.7 g/dL (ref 30.0–36.0)
MCV: 93.4 fL (ref 78.0–100.0)
MONO ABS: 0.7 10*3/uL (ref 0.1–1.0)
MONOS PCT: 13 % — AB (ref 3–12)
NEUTROS PCT: 43 % (ref 43–77)
Neutro Abs: 2.2 10*3/uL (ref 1.7–7.7)
Platelets: 120 10*3/uL — ABNORMAL LOW (ref 150–400)
RBC: 3.63 MIL/uL — ABNORMAL LOW (ref 4.22–5.81)
RDW: 15.9 % — AB (ref 11.5–15.5)
WBC: 5.2 10*3/uL (ref 4.0–10.5)

## 2015-04-04 LAB — URINALYSIS, ROUTINE W REFLEX MICROSCOPIC
BILIRUBIN URINE: NEGATIVE
GLUCOSE, UA: NEGATIVE mg/dL
HGB URINE DIPSTICK: NEGATIVE
Ketones, ur: NEGATIVE mg/dL
Leukocytes, UA: NEGATIVE
Nitrite: NEGATIVE
Protein, ur: NEGATIVE mg/dL
SPECIFIC GRAVITY, URINE: 1.005 (ref 1.005–1.030)
UROBILINOGEN UA: 0.2 mg/dL (ref 0.0–1.0)
pH: 5.5 (ref 5.0–8.0)

## 2015-04-04 LAB — COMPREHENSIVE METABOLIC PANEL
ALBUMIN: 3.1 g/dL — AB (ref 3.5–5.0)
ALK PHOS: 36 U/L — AB (ref 38–126)
ALT: 9 U/L — AB (ref 17–63)
AST: 13 U/L — AB (ref 15–41)
Anion gap: 9 (ref 5–15)
BUN: 17 mg/dL (ref 6–20)
CALCIUM: 7.9 mg/dL — AB (ref 8.9–10.3)
CHLORIDE: 115 mmol/L — AB (ref 101–111)
CO2: 16 mmol/L — AB (ref 22–32)
CREATININE: 1.36 mg/dL — AB (ref 0.61–1.24)
GFR calc Af Amer: >60 mL/min (ref >60)
GFR calc non Af Amer: 54 mL/min — ABNORMAL LOW (ref >60)
GLUCOSE: 68 mg/dL (ref 65–99)
Potassium: 3.2 mmol/L — ABNORMAL LOW (ref 3.5–5.1)
SODIUM: 140 mmol/L (ref 135–145)
Total Bilirubin: 0.5 mg/dL (ref 0.3–1.2)
Total Protein: 5.7 g/dL — ABNORMAL LOW (ref 6.5–8.1)

## 2015-04-04 LAB — I-STAT CG4 LACTIC ACID, ED
Lactic Acid, Venous: 4.46 mmol/L (ref 0.5–2.0)
Lactic Acid, Venous: 3.63 mmol/L (ref 0.5–2.0)

## 2015-04-04 LAB — ETHANOL: Alcohol, Ethyl (B): 245 mg/dL — ABNORMAL HIGH (ref <5)

## 2015-04-04 LAB — CK: Total CK: 151 U/L (ref 49–397)

## 2015-04-04 MED ORDER — SODIUM CHLORIDE 0.9 % IV BOLUS (SEPSIS): 1000.0000 mL | Freq: Once | INTRAVENOUS | Status: DC

## 2015-04-04 MED ORDER — SODIUM CHLORIDE 0.9 % IV BOLUS (SEPSIS)
2000.0000 mL | Freq: Once | INTRAVENOUS | Status: AC
  Administered 2015-04-04: 2000 mL via INTRAVENOUS

## 2015-04-04 NOTE — ED Notes (Signed)
Patient was given food and coke. Patient is eating without any problems.
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
## 2015-04-04 NOTE — ED Notes (Addendum)
Pt found on ground outside of homeless shelter. Hypotensive. Admits to heavy ETOH use. A fib on monitor with EMS.

## 2015-04-04 NOTE — ED Notes (Signed)
Portable Xray at bedside.

## 2015-04-04 NOTE — ED Notes (Signed)
Below orders not completed by EW. 

## 2015-04-04 NOTE — Discharge Instructions (Signed)
Alcohol Intoxication °Alcohol intoxication occurs when you drink enough alcohol that it affects your ability to function. It can be mild or very severe. Drinking a lot of alcohol in a short time is called binge drinking. This can be very harmful. Drinking alcohol can also be more dangerous if you are taking medicines or other drugs. Some of the effects caused by alcohol may include: °· Loss of coordination. °· Changes in mood and behavior. °· Unclear thinking. °· Trouble talking (slurred speech). °· Throwing up (vomiting). °· Confusion. °· Slowed breathing. °· Twitching and shaking (seizures). °· Loss of consciousness. °HOME CARE °· Do not drive after drinking alcohol. °· Drink enough water and fluids to keep your pee (urine) clear or pale yellow. Avoid caffeine. °· Only take medicine as told by your doctor. °GET HELP IF: °· You throw up (vomit) many times. °· You do not feel better after a few days. °· You frequently have alcohol intoxication. Your doctor can help decide if you should see a substance use treatment counselor. °GET HELP RIGHT AWAY IF: °· You become shaky when you stop drinking. °· You have twitching and shaking. °· You throw up blood. It may look bright red or like coffee grounds. °· You notice blood in your poop (bowel movements). °· You become lightheaded or pass out (faint). °MAKE SURE YOU:  °· Understand these instructions. °· Will watch your condition. °· Will get help right away if you are not doing well or get worse. °Document Released: 01/01/2008 Document Revised: 03/17/2013 Document Reviewed: 12/18/2012 °[employer]® Patient Information ©2015 [employer]. This information is not intended to replace advice given to you by your health care provider. Make sure you discuss any questions you have with your health care provider. ° °

## 2015-04-04 NOTE — ED Notes (Signed)
Adela Lank, DO made aware of patient CG4 lactic result.

## 2015-04-04 NOTE — ED Notes (Signed)
Bed: WA17 Expected date:  Expected time:  Means of arrival:  Comments: Ems- ETOH 

## 2015-04-04 NOTE — ED Notes (Signed)
Phlebotomy at bedside.

## 2015-04-05 LAB — URINE CULTURE

## 2015-04-06 LAB — PHENYTOIN LEVEL, FREE AND TOTAL
Phenytoin, Free: NOT DETECTED ug/mL (ref 1.0–2.0)
Phenytoin, Total: <0.8 ug/mL — ABNORMAL LOW (ref 10.0–20.0)

## 2015-04-09 LAB — CULTURE, BLOOD (ROUTINE X 2)
CULTURE: NO GROWTH
Culture: NO GROWTH

## 2015-04-10 ENCOUNTER — Emergency Department (HOSPITAL_COMMUNITY)
Admission: EM | Admit: 2015-04-10 | Discharge: 2015-04-11 | Disposition: A | Discharge status: Home / Self Care | Payer: Medicare Other | Attending: Emergency Medicine | Admitting: Emergency Medicine

## 2015-04-10 ENCOUNTER — Encounter (HOSPITAL_COMMUNITY): Payer: Self-pay

## 2015-04-10 DIAGNOSIS — I10 Essential (primary) hypertension: Secondary | ICD-10-CM | POA: Diagnosis not present

## 2015-04-10 DIAGNOSIS — Z9114 Patient's other noncompliance with medication regimen: Secondary | ICD-10-CM

## 2015-04-10 DIAGNOSIS — Z87828 Personal history of other (healed) physical injury and trauma: Secondary | ICD-10-CM | POA: Insufficient documentation

## 2015-04-10 DIAGNOSIS — Z9889 Other specified postprocedural states: Secondary | ICD-10-CM | POA: Insufficient documentation

## 2015-04-10 DIAGNOSIS — Z720 Tobacco use: Secondary | ICD-10-CM | POA: Diagnosis not present

## 2015-04-10 DIAGNOSIS — R569 Unspecified convulsions: Secondary | ICD-10-CM | POA: Diagnosis not present

## 2015-04-10 DIAGNOSIS — Z86718 Personal history of other venous thrombosis and embolism: Secondary | ICD-10-CM | POA: Diagnosis not present

## 2015-04-10 DIAGNOSIS — R011 Cardiac murmur, unspecified: Secondary | ICD-10-CM | POA: Insufficient documentation

## 2015-04-10 DIAGNOSIS — Z9119 Patient's noncompliance with other medical treatment and regimen: Secondary | ICD-10-CM | POA: Insufficient documentation

## 2015-04-10 DIAGNOSIS — Z8673 Personal history of transient ischemic attack (TIA), and cerebral infarction without residual deficits: Secondary | ICD-10-CM | POA: Diagnosis not present

## 2015-04-10 DIAGNOSIS — G40909 Epilepsy, unspecified, not intractable, without status epilepticus: Secondary | ICD-10-CM

## 2015-04-10 HISTORY — DX: Unspecified atrial fibrillation: I48.91

## 2015-04-10 MED ORDER — THIAMINE HCL 100 MG/ML IJ SOLN
100.0000 mg | Freq: Every day | INTRAMUSCULAR | Status: DC
Start: 2015-04-11 — End: 2015-04-11
  Administered 2015-04-11: 100 mg via INTRAVENOUS
  Filled 2015-04-10: qty 2

## 2015-04-10 NOTE — ED Notes (Signed)
[place] [facility] [place].  Bystander saw patient having seizure while sitting on ground.  Upon EMS arrival pt was postictal with snoring respirations.  Pt now alert and responding to some commands.
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
## 2015-04-11 DIAGNOSIS — R569 Unspecified convulsions: Secondary | ICD-10-CM | POA: Diagnosis not present

## 2015-04-11 LAB — CBC WITH DIFFERENTIAL/PLATELET
Basophils Absolute: 0 10*3/uL (ref 0.0–0.1)
Basophils Relative: 0 % (ref 0–1)
EOS PCT: 3 % (ref 0–5)
Eosinophils Absolute: 0.2 10*3/uL (ref 0.0–0.7)
HCT: 38.1 % — ABNORMAL LOW (ref 39.0–52.0)
HEMOGLOBIN: 12.6 g/dL — AB (ref 13.0–17.0)
LYMPHS ABS: 1.5 10*3/uL (ref 0.7–4.0)
LYMPHS PCT: 20 % (ref 12–46)
MCH: 30.3 pg (ref 26.0–34.0)
MCHC: 33.1 g/dL (ref 30.0–36.0)
MCV: 91.6 fL (ref 78.0–100.0)
MONOS PCT: 7 % (ref 3–12)
Monocytes Absolute: 0.5 10*3/uL (ref 0.1–1.0)
Neutro Abs: 5.3 10*3/uL (ref 1.7–7.7)
Neutrophils Relative %: 70 % (ref 43–77)
PLATELETS: 146 10*3/uL — AB (ref 150–400)
RBC: 4.16 MIL/uL — AB (ref 4.22–5.81)
RDW: 16.4 % — ABNORMAL HIGH (ref 11.5–15.5)
WBC: 7.6 10*3/uL (ref 4.0–10.5)

## 2015-04-11 LAB — COMPREHENSIVE METABOLIC PANEL
ALK PHOS: 47 U/L (ref 38–126)
ALT: 14 U/L — AB (ref 17–63)
AST: 25 U/L (ref 15–41)
Albumin: 3.7 g/dL (ref 3.5–5.0)
Anion gap: 7 (ref 5–15)
BUN: 15 mg/dL (ref 6–20)
CALCIUM: 8.7 mg/dL — AB (ref 8.9–10.3)
CO2: 23 mmol/L (ref 22–32)
CREATININE: 0.91 mg/dL (ref 0.61–1.24)
Chloride: 113 mmol/L — ABNORMAL HIGH (ref 101–111)
Glucose, Bld: 104 mg/dL — ABNORMAL HIGH (ref 65–99)
Potassium: 3.9 mmol/L (ref 3.5–5.1)
Sodium: 143 mmol/L (ref 135–145)
Total Bilirubin: 0.5 mg/dL (ref 0.3–1.2)
Total Protein: 6.9 g/dL (ref 6.5–8.1)

## 2015-04-11 LAB — URINALYSIS, ROUTINE W REFLEX MICROSCOPIC
BILIRUBIN URINE: NEGATIVE
Glucose, UA: NEGATIVE mg/dL
HGB URINE DIPSTICK: NEGATIVE
KETONES UR: NEGATIVE mg/dL
Leukocytes, UA: NEGATIVE
Nitrite: NEGATIVE
PROTEIN: NEGATIVE mg/dL
Specific Gravity, Urine: 1.02 (ref 1.005–1.030)
UROBILINOGEN UA: 0.2 mg/dL (ref 0.0–1.0)
pH: 6.5 (ref 5.0–8.0)

## 2015-04-11 LAB — RAPID URINE DRUG SCREEN, HOSP PERFORMED
Amphetamines: NOT DETECTED
Barbiturates: NOT DETECTED
Benzodiazepines: NOT DETECTED
Cocaine: NOT DETECTED
OPIATES: NOT DETECTED
Tetrahydrocannabinol: NOT DETECTED

## 2015-04-11 LAB — ETHANOL: Alcohol, Ethyl (B): <5 mg/dL (ref <5)

## 2015-04-11 LAB — PHENYTOIN LEVEL, TOTAL

## 2015-04-11 MED ORDER — PHENYTOIN SODIUM EXTENDED 300 MG PO CAPS: 300.0000 mg | ORAL_CAPSULE | Freq: Every day | ORAL | Status: DC

## 2015-04-11 MED ORDER — SODIUM CHLORIDE 0.9 % IV SOLN
1000.0000 mg | Freq: Once | INTRAVENOUS | Status: AC
  Administered 2015-04-11: 1000 mg via INTRAVENOUS
  Filled 2015-04-11: qty 20

## 2015-04-11 NOTE — Progress Notes (Signed)
MEDICATION RELATED CONSULT NOTE - INITIAL   Pharmacy Consult for Phenytoin IV Load Indication: Low level/seizure  No Known Allergies  Patient Measurements: Height: 6' 0.05" (183 cm) IBW/kg (Calculated) : 77.71 Wt=67 kg  Vital Signs: Temp: 98.3 F (36.8 C) (09/13 0445) Temp Source: Oral (09/13 0445) BP: 160/92 mmHg (09/13 0445) Pulse Rate: 94 (09/13 0445) Intake/Output from previous day:   Intake/Output from this shift:    Labs:  Recent Labs  04/11/15 0030  WBC 7.6  HGB 12.6*  HCT 38.1*  PLT 146*  CREATININE 0.91  ALBUMIN 3.7  PROT 6.9  AST 25  ALT 14*  ALKPHOS 47  BILITOT 0.5   CrCl cannot be calculated (Unknown ideal weight.).   Microbiology: Recent Results (from the past 720 hour(s))  Blood Culture (routine x 2)     Status: None   Collection Time: 04/04/15  6:45 PM  Result Value Ref Range Status   Specimen Description BLOOD RIGHT HAND  Final   Special Requests BOTTLES DRAWN AEROBIC AND ANAEROBIC 5CC  Final   Culture   Final    NO GROWTH 5 DAYS Performed at [facility]    Report Status 04/09/2015 FINAL  Final  Blood Culture (routine x 2)     Status: None   Collection Time: 04/04/15  6:47 PM  Result Value Ref Range Status   Specimen Description BLOOD LEFT HAND  Final   Special Requests BOTTLES DRAWN AEROBIC AND ANAEROBIC 5CC  Final   Culture   Final    NO GROWTH 5 DAYS Performed at [facility]    Report Status 04/09/2015 FINAL  Final  Urine culture     Status: None   Collection Time: 04/04/15  6:47 PM  Result Value Ref Range Status   Specimen Description URINE, RANDOM  Final   Special Requests NONE  Final   Culture   Final    9,000 COLONIES/mL INSIGNIFICANT GROWTH Performed at [facility]    Report Status 04/05/2015 FINAL  Final    Medical History: Past Medical History  Diagnosis Date  . Hypertension   . 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 Closed head injury 05/2006    hx/notes 11/01/2009   . DVT (deep venous thrombosis) 06/2006    [name] 10/19/2009  . Seizures     [name] 10/19/2009  . Stroke 09/2009    w/right sided weakness/notes 10/31/2009  . Heart murmur   . Atrial fibrillation     Medications:   (Not in a hospital admission) Scheduled:  . thiamine  100 mg Intravenous Daily   Infusions:  . phenytoin (DILANTIN) IV 1,000 mg (04/11/15 0443)    Assessment: 17 yoM homeless s/p witnessed seizure.  On Phenytoin  Qhs PTA with LD 9/11.  Phenytoin IV load per Rx.  Phenytoin level= <2.5  Alb=3.7  Goal of Therapy:  10-20 mcg/ml  Plan:   Phenytoin 1Gm (~15mg /kg) x1   F/u resume of home dose  Lorenza Evangelist 04/11/2015,5:09 AM

## 2015-04-11 NOTE — Discharge Instructions (Signed)
Take the seizure meds as prescribed. THEY ARE $4 AT [facility]. Seizure can be deadly. Stay away from alcohol.  Seizure, Adult A seizure is abnormal electrical activity in the brain. Seizures usually last from 30 seconds to 2 minutes. There are various types of seizures. Before a seizure, you may have a warning sensation (aura) that a seizure is about to occur. An aura may include the following symptoms:   Fear or anxiety.  Nausea.  Feeling like the room is spinning (vertigo).  Vision changes, such as seeing flashing lights or spots. Common symptoms during a seizure include:  A change in attention or behavior (altered mental status).  Convulsions with rhythmic jerking movements.  Drooling.  Rapid eye movements.  Grunting.  Loss of bladder and bowel control.  Bitter taste in the mouth.  Tongue biting. After a seizure, you may feel confused and sleepy. You may also have an injury resulting from convulsions during the seizure. HOME CARE INSTRUCTIONS   If you are given medicines, take them exactly as prescribed by your health care provider.  Keep all follow-up appointments as directed by your health care provider.  Do not swim or drive or engage in risky activity during which a seizure could cause further injury to you or others until your health care provider says it is OK.  Get adequate rest.  Teach friends and family what to do if you have a seizure. They should:  Lay you on the ground to prevent a fall.  Put a cushion under your head.  Loosen any tight clothing around your neck.  Turn you on your side. If vomiting occurs, this helps keep your airway clear.  Stay with you until you recover.  Know whether or not you need emergency care. SEEK IMMEDIATE MEDICAL CARE IF:  The seizure lasts longer than 5 minutes.  The seizure is severe or you do not wake up immediately after the seizure.  You have an altered mental status after the seizure.  You are having more  frequent or worsening seizures. Someone should drive you to the emergency department or call local emergency services (911 in [place] MAKE SURE YOU:  Understand these instructions.  Will watch your condition.  Will get help right away if you are not doing well or get worse. Document Released: 07/12/2000 Document Revised: 05/05/2013 Document Reviewed: 02/24/2013 [facility] Patient Information 2015 [place], [place]. This information is not intended to replace advice given to you by your health care provider. Make sure you discuss any questions you have with your health care provider.

## 2015-04-11 NOTE — ED Notes (Signed)
Patient attempting to urinate 

## 2015-04-11 NOTE — ED Notes (Signed)
Writer had two unsuccessful attempt to draw blood work, Charity fundraiser notified

## 2015-04-14 ENCOUNTER — Emergency Department (HOSPITAL_COMMUNITY)
Admission: EM | Admit: 2015-04-14 | Discharge: 2015-04-14 | Discharge status: Against Medical Advice | Payer: Medicare Other | Attending: Emergency Medicine | Admitting: Emergency Medicine

## 2015-04-14 ENCOUNTER — Encounter (HOSPITAL_COMMUNITY): Payer: Self-pay | Admitting: Emergency Medicine

## 2015-04-14 DIAGNOSIS — Z87828 Personal history of other (healed) physical injury and trauma: Secondary | ICD-10-CM | POA: Diagnosis not present

## 2015-04-14 DIAGNOSIS — G40909 Epilepsy, unspecified, not intractable, without status epilepticus: Secondary | ICD-10-CM | POA: Insufficient documentation

## 2015-04-14 DIAGNOSIS — F911 Conduct disorder, childhood-onset type: Secondary | ICD-10-CM | POA: Diagnosis not present

## 2015-04-14 DIAGNOSIS — Z720 Tobacco use: Secondary | ICD-10-CM | POA: Diagnosis not present

## 2015-04-14 DIAGNOSIS — Z8673 Personal history of transient ischemic attack (TIA), and cerebral infarction without residual deficits: Secondary | ICD-10-CM | POA: Diagnosis not present

## 2015-04-14 DIAGNOSIS — Z86718 Personal history of other venous thrombosis and embolism: Secondary | ICD-10-CM | POA: Insufficient documentation

## 2015-04-14 DIAGNOSIS — I10 Essential (primary) hypertension: Secondary | ICD-10-CM | POA: Diagnosis not present

## 2015-04-14 DIAGNOSIS — R011 Cardiac murmur, unspecified: Secondary | ICD-10-CM | POA: Diagnosis not present

## 2015-04-14 DIAGNOSIS — F10129 Alcohol abuse with intoxication, unspecified: Secondary | ICD-10-CM | POA: Diagnosis present

## 2015-04-14 NOTE — ED Notes (Signed)
Per EMS, [employer] was called for a disturbance at a place of business. Upon EMS arrival pt was aggressive and appeared intoxicated. EMS states pt followed commands but would not answer questions. Pt was ambulatory upon EMS arrival.

## 2015-04-14 NOTE — ED Notes (Signed)
Bed: [place] Expected date:  Expected time:  Means of arrival:  Comments: Ems- etohj
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
## 2015-04-14 NOTE — ED Notes (Signed)
Pt walked out of ER. Dr. Lynelle Doctor notified.

## 2015-05-24 ENCOUNTER — Emergency Department (HOSPITAL_COMMUNITY)
Admission: EM | Admit: 2015-05-24 | Discharge: 2015-05-24 | Disposition: A | Discharge status: Home / Self Care | Payer: Medicare Other | Attending: Emergency Medicine | Admitting: Emergency Medicine

## 2015-05-24 ENCOUNTER — Encounter (HOSPITAL_COMMUNITY): Payer: Self-pay

## 2015-05-24 ENCOUNTER — Emergency Department (HOSPITAL_COMMUNITY): Payer: Medicare Other

## 2015-05-24 DIAGNOSIS — Z86718 Personal history of other venous thrombosis and embolism: Secondary | ICD-10-CM | POA: Insufficient documentation

## 2015-05-24 DIAGNOSIS — Z87828 Personal history of other (healed) physical injury and trauma: Secondary | ICD-10-CM | POA: Diagnosis not present

## 2015-05-24 DIAGNOSIS — G40909 Epilepsy, unspecified, not intractable, without status epilepticus: Secondary | ICD-10-CM | POA: Diagnosis not present

## 2015-05-24 DIAGNOSIS — F10120 Alcohol abuse with intoxication, uncomplicated: Secondary | ICD-10-CM | POA: Diagnosis not present

## 2015-05-24 DIAGNOSIS — R011 Cardiac murmur, unspecified: Secondary | ICD-10-CM | POA: Insufficient documentation

## 2015-05-24 DIAGNOSIS — Z8673 Personal history of transient ischemic attack (TIA), and cerebral infarction without residual deficits: Secondary | ICD-10-CM | POA: Insufficient documentation

## 2015-05-24 DIAGNOSIS — Z720 Tobacco use: Secondary | ICD-10-CM | POA: Diagnosis not present

## 2015-05-24 DIAGNOSIS — I10 Essential (primary) hypertension: Secondary | ICD-10-CM | POA: Diagnosis not present

## 2015-05-24 DIAGNOSIS — F10129 Alcohol abuse with intoxication, unspecified: Secondary | ICD-10-CM | POA: Diagnosis present

## 2015-05-24 DIAGNOSIS — F10920 Alcohol use, unspecified with intoxication, uncomplicated: Secondary | ICD-10-CM

## 2015-05-24 MED ORDER — ONDANSETRON 8 MG PO TBDP: 8.0000 mg | ORAL_TABLET | Freq: Once | ORAL | Status: DC
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
## 2015-05-24 NOTE — Discharge Instructions (Signed)
Alcohol Intoxication  Alcohol intoxication occurs when you drink enough alcohol that it affects your ability to function. It can be mild or very severe. Drinking a lot of alcohol in a short time is called binge drinking. This can be very harmful. Drinking alcohol can also be more dangerous if you are taking medicines or other drugs. Some of the effects caused by alcohol may include:  · Loss of coordination.  · Changes in mood and behavior.  · Unclear thinking.  · Trouble talking (slurred speech).  · Throwing up (vomiting).  · Confusion.  · Slowed breathing.  · Twitching and shaking (seizures).  · Loss of consciousness.  HOME CARE  · Do not drive after drinking alcohol.  · Drink enough water and fluids to keep your pee (urine) clear or pale yellow. Avoid caffeine.  · Only take medicine as told by your doctor.  GET HELP IF:  · You throw up (vomit) many times.  · You do not feel better after a few days.  · You frequently have alcohol intoxication. Your doctor can help decide if you should see a substance use treatment counselor.  GET HELP RIGHT AWAY IF:  · You become shaky when you stop drinking.  · You have twitching and shaking.  · You throw up blood. It may look bright red or like coffee grounds.  · You notice blood in your poop (bowel movements).  · You become lightheaded or pass out (faint).  MAKE SURE YOU:   · Understand these instructions.  · Will watch your condition.  · Will get help right away if you are not doing well or get worse.     This information is not intended to replace advice given to you by your health care provider. Make sure you discuss any questions you have with your health care provider.     Document Released: 01/01/2008 Document Revised: 03/17/2013 Document Reviewed: 12/18/2012  Elsevier Interactive Patient Education ©2016 [employer]

## 2015-05-24 NOTE — ED Notes (Signed)
Patient came in with multiple layers of clothing - two pairs of pants, one pair of shorts, one short sleeved shirt, one paper scrub top, two jackets, one pair of socks, and one pair of tennis shoes.  Pants, socks, and shoes were urine soaked and soiled.  Patient was bathed and changed into clean paper scrubs and all belongings were placed in belonging bags at bedside.  Patient given warm blankets.

## 2015-05-24 NOTE — ED Notes (Signed)
Pt arouses to physical stimuli; awaiting pt to sober up to assess ability to ambulate

## 2015-05-24 NOTE — ED Notes (Signed)
Bed: [id number] Expected date:  Expected time:  Means of arrival:  Comments: EMS 57M intoxication

## 2015-05-24 NOTE — ED Notes (Signed)
[facility]  Pt appears to be intoxicated.  Pt has hx of same.
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
## 2015-06-09 IMAGING — CT CT HEAD W/O CM
3 of 4 series · 15 of 30 positions shown, 17 images · non-contrast
Comparison: 05/21/2014.

CLINICAL DATA: Dementia.  Seizures.  Hypertension.  Fall.

EXAM:
CT HEAD WITHOUT CONTRAST
CT CERVICAL SPINE WITHOUT CONTRAST
TECHNIQUE: Multidetector CT imaging of the head and cervical spine was
performed following the standard protocol without intravenous
contrast. Multiplanar CT image reconstructions of the cervical spine
were also generated.

[Series 3: head w/o · axial · non-contrast · 0.46mm/px · z∈[-468,-414]mm · 2 of 34 slices shown]
[im 12/34  brain]
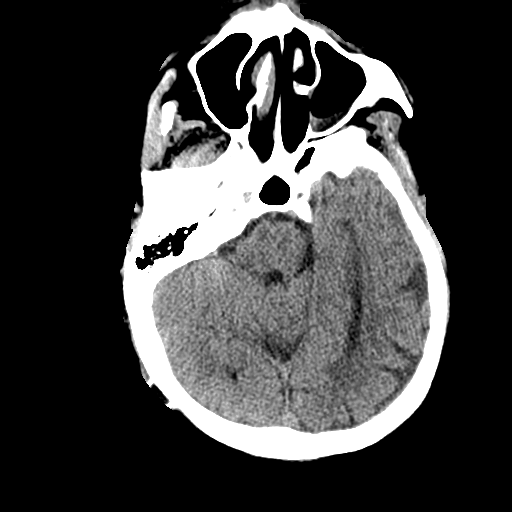
[im 23/34  brain]
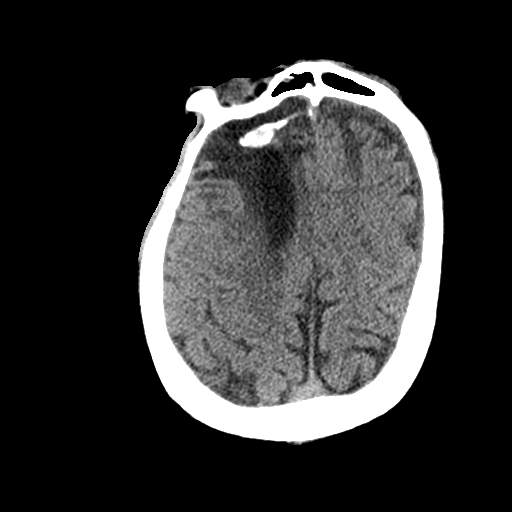

[Series 4: bone windows · axial · 0.46mm/px · z∈[-496,-386]mm · 5 of 57 slices shown]
[im 10/57  bone]
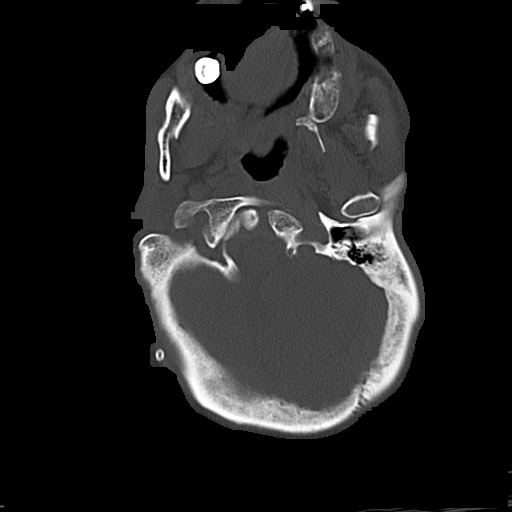
[im 19/57  bone]
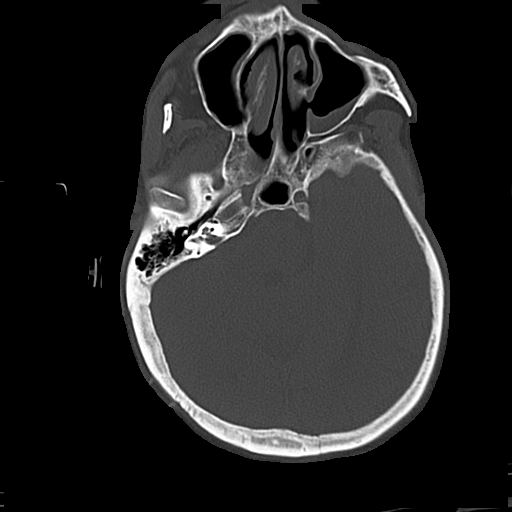
[im 29/57  bone]
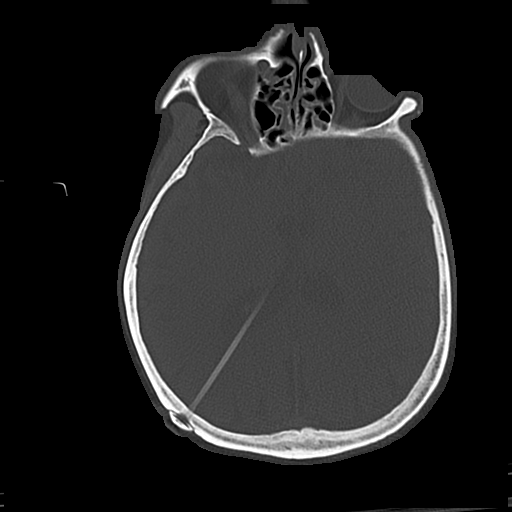
[im 38/57  bone]
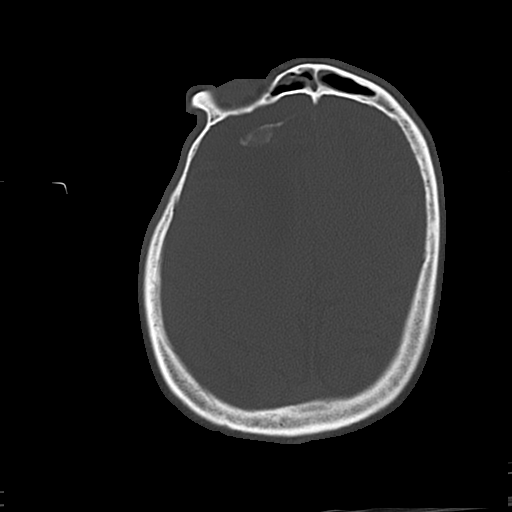
[im 47/57  bone]
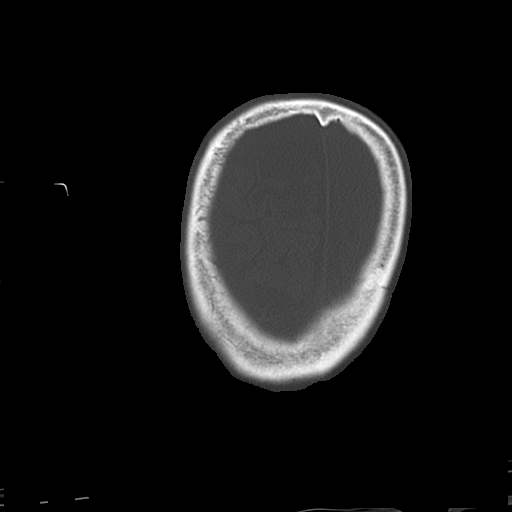

[Series 9: axial recon · axial · 0.21mm/px · z∈[-622,-496]mm · 8 of 86 slices shown, 10 images]
[im 10/86  brain]
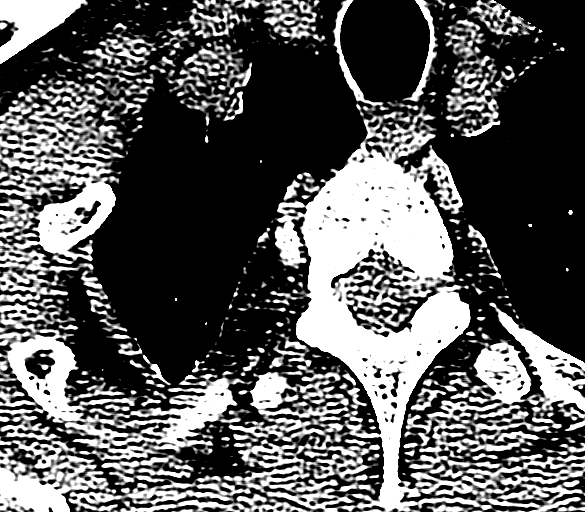
[im 10/86  bone]
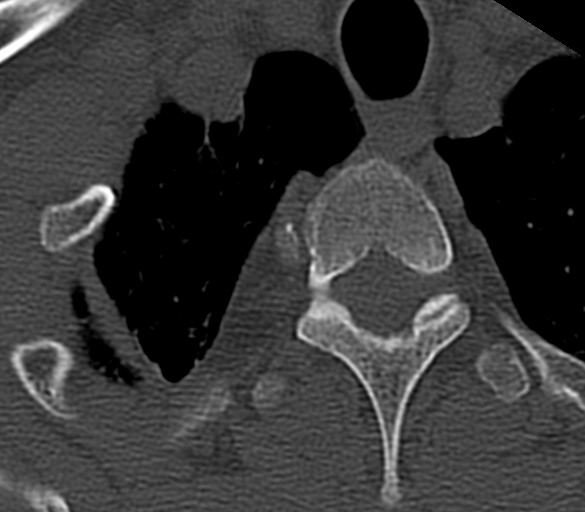
[im 19/86  brain]
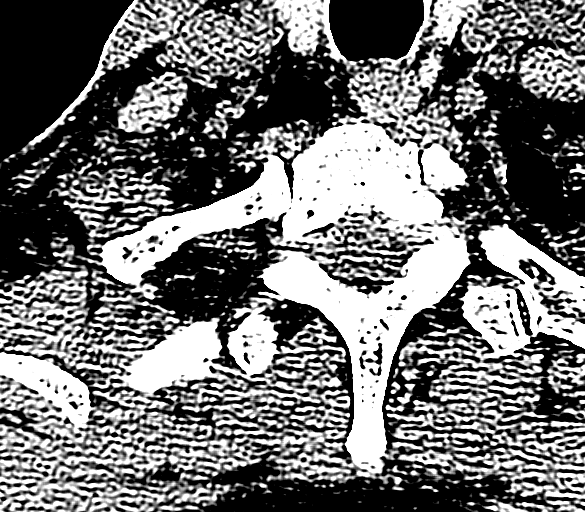
[im 29/86  brain]
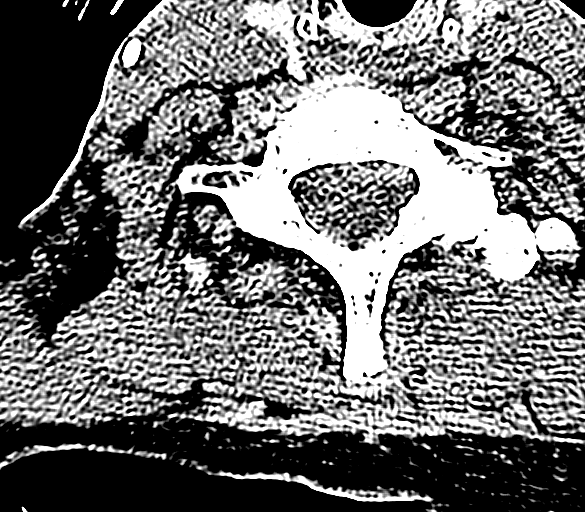
[im 38/86  brain]
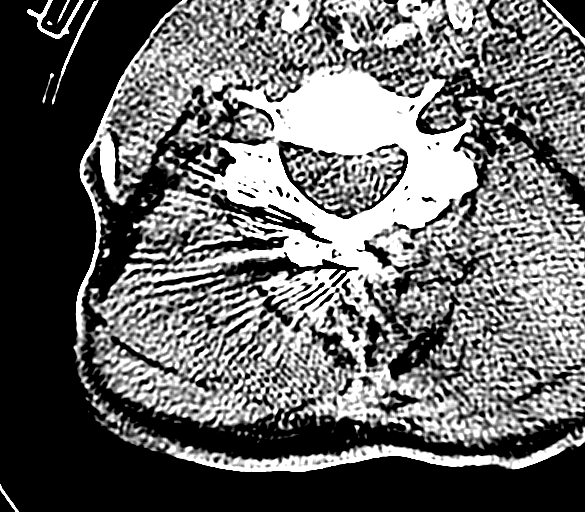
[im 48/86  brain]
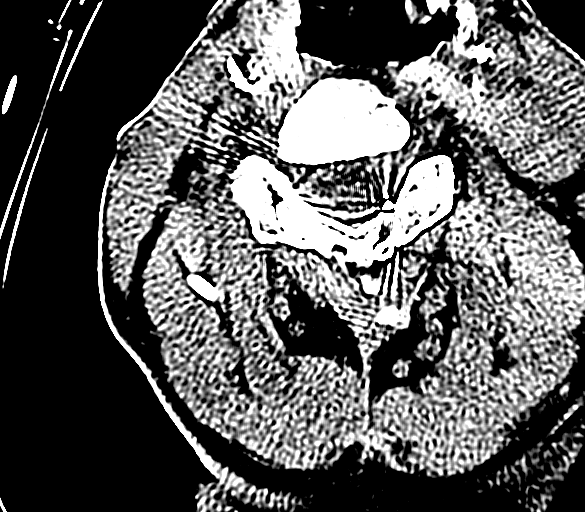
[im 48/86  bone]
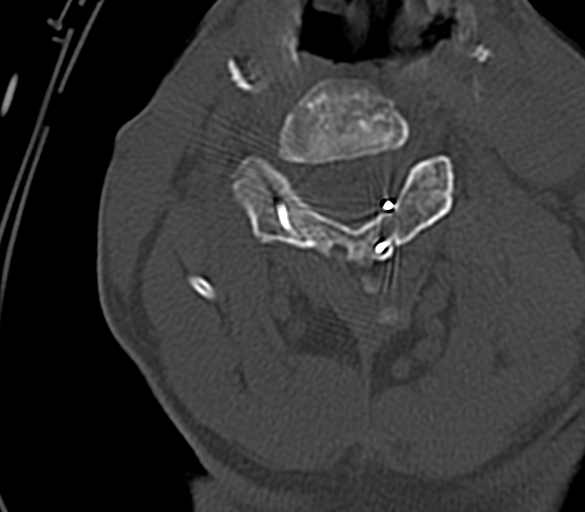
[im 57/86  brain]
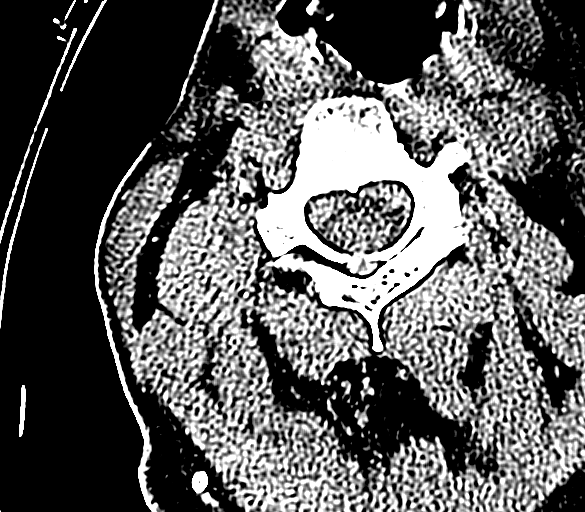
[im 67/86  brain]
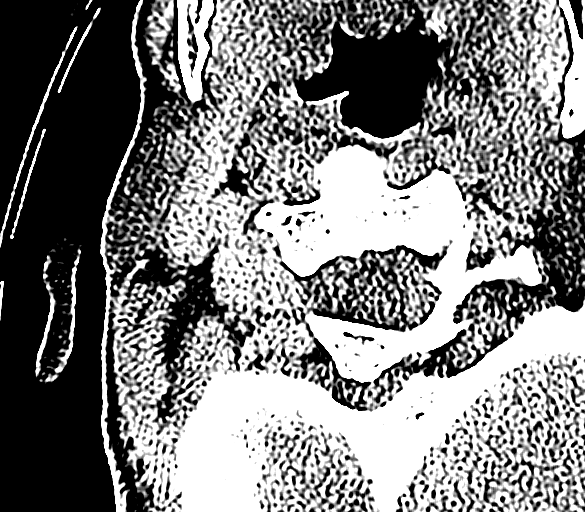
[im 76/86  brain]
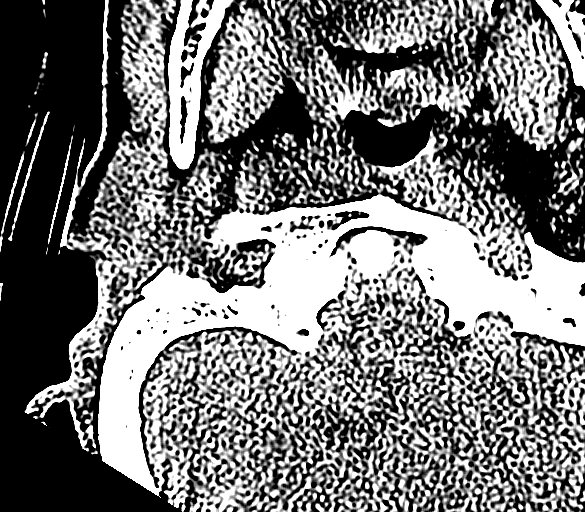

[15 of 30 positions shown; findings below may reference images not displayed]

FINDINGS: CT HEAD FINDINGS

Bifrontal encephalomalacia again noted. Chronic left frontal
subdural hematoma again noted, unchanged from 05/21/2014. Dystrophic
calcification noted over the right frontal region. Ventriculostomy
tube in stable position. No significant hydrocephalus. There is no
evidence of new hemorrhage. There is no mass. Diffuse mild mucosal
thickening noted throughout the frontal, ethmoidal, maxillary,
sphenoid sinuses. Patient is severely rotated and angulated to the
left. No calvarial fractures present.

CT CERVICAL SPINE FINDINGS

Severe rotational and angulation deformity is present. Postsurgical
changes noted C4-C5. Diffuse severe degenerative change present. No
soft tissue swelling. No evidence of fracture or dislocation.
IMPRESSION: 1. Stable head CT. Bifrontal encephalomalacia again noted. Chronic
left frontal subdural hematoma noted. Dystrophic calcification right
frontal region again noted. No acute intracranial abnormality.
2. Severe rotational and angulation deformity of the cervical spine
is present. Postsurgical changes C4-C5. Diffuse degenerative change.
No evidence of fracture or dislocation.

## 2015-07-06 ENCOUNTER — Emergency Department (HOSPITAL_COMMUNITY)
Admission: EM | Admit: 2015-07-06 | Discharge: 2015-07-07 | Disposition: A | Discharge status: Home / Self Care | Payer: Medicare Other | Attending: Emergency Medicine | Admitting: Emergency Medicine

## 2015-07-06 ENCOUNTER — Encounter (HOSPITAL_COMMUNITY): Payer: Self-pay | Admitting: Nurse Practitioner

## 2015-07-06 ENCOUNTER — Emergency Department (HOSPITAL_COMMUNITY): Payer: Medicare Other

## 2015-07-06 DIAGNOSIS — Z043 Encounter for examination and observation following other accident: Secondary | ICD-10-CM | POA: Diagnosis not present

## 2015-07-06 DIAGNOSIS — I10 Essential (primary) hypertension: Secondary | ICD-10-CM | POA: Diagnosis not present

## 2015-07-06 DIAGNOSIS — F10920 Alcohol use, unspecified with intoxication, uncomplicated: Secondary | ICD-10-CM

## 2015-07-06 DIAGNOSIS — Z79899 Other long term (current) drug therapy: Secondary | ICD-10-CM | POA: Insufficient documentation

## 2015-07-06 DIAGNOSIS — R011 Cardiac murmur, unspecified: Secondary | ICD-10-CM | POA: Insufficient documentation

## 2015-07-06 DIAGNOSIS — Z86718 Personal history of other venous thrombosis and embolism: Secondary | ICD-10-CM | POA: Insufficient documentation

## 2015-07-06 DIAGNOSIS — Y9289 Other specified places as the place of occurrence of the external cause: Secondary | ICD-10-CM | POA: Diagnosis not present

## 2015-07-06 DIAGNOSIS — Y9389 Activity, other specified: Secondary | ICD-10-CM | POA: Insufficient documentation

## 2015-07-06 DIAGNOSIS — W1839XA Other fall on same level, initial encounter: Secondary | ICD-10-CM | POA: Insufficient documentation

## 2015-07-06 DIAGNOSIS — F10120 Alcohol abuse with intoxication, uncomplicated: Secondary | ICD-10-CM | POA: Diagnosis not present

## 2015-07-06 DIAGNOSIS — F17210 Nicotine dependence, cigarettes, uncomplicated: Secondary | ICD-10-CM | POA: Diagnosis not present

## 2015-07-06 DIAGNOSIS — Y998 Other external cause status: Secondary | ICD-10-CM | POA: Insufficient documentation

## 2015-07-06 DIAGNOSIS — Z8669 Personal history of other diseases of the nervous system and sense organs: Secondary | ICD-10-CM | POA: Diagnosis not present

## 2015-07-06 DIAGNOSIS — Z8673 Personal history of transient ischemic attack (TIA), and cerebral infarction without residual deficits: Secondary | ICD-10-CM | POA: Diagnosis not present

## 2015-07-06 DIAGNOSIS — F10129 Alcohol abuse with intoxication, unspecified: Secondary | ICD-10-CM | POA: Diagnosis present

## 2015-07-06 DIAGNOSIS — R4182 Altered mental status, unspecified: Secondary | ICD-10-CM | POA: Diagnosis not present

## 2015-07-06 MED ORDER — HALOPERIDOL LACTATE 5 MG/ML IJ SOLN
2.0000 mg | Freq: Once | INTRAMUSCULAR | Status: DC
Start: 2015-07-06 — End: 2015-07-06
  Filled 2015-07-06: qty 1

## 2015-07-06 MED ORDER — LORAZEPAM 2 MG/ML IJ SOLN
2.0000 mg | Freq: Once | INTRAMUSCULAR | Status: AC
  Administered 2015-07-06: 2 mg via INTRAMUSCULAR
  Filled 2015-07-06: qty 1

## 2015-07-06 MED ORDER — HALOPERIDOL LACTATE 5 MG/ML IJ SOLN
5.0000 mg | Freq: Once | INTRAMUSCULAR | Status: AC
  Administered 2015-07-06: 5 mg via INTRAMUSCULAR
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
## 2015-07-06 NOTE — ED Notes (Signed)
Pt tried to jump out off the stretcher without any clothing. Had to be assisted to stay in stretcher by staff to prevent him from falling. Pt aggressive, swinging arms at staff and refusing to put a gown on. Security called and pt given medication.

## 2015-07-06 NOTE — ED Notes (Signed)
Bed: WA06 Expected date:  Expected time:  Means of arrival:  Comments: EMS - FALL 
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
## 2015-07-07 NOTE — ED Provider Notes (Signed)
Patient initially seen and evaluated by Dr. Adela LankFloyd having come in with alcohol intoxication in a fall. Head CT was unremarkable. He was observed in the ED overnight and was resting comfortably through the night. He is now awake, alert, conversant and will be discharged.  Dione Boozeavid Analicia Skibinski, MD 07/07/15 [phone]

## 2015-07-07 NOTE — Discharge Instructions (Signed)
Alcohol Intoxication Alcohol intoxication occurs when the amount of alcohol that a person has consumed impairs his or her ability to mentally and physically function. Alcohol directly impairs the normal chemical activity of the brain. Drinking large amounts of alcohol can lead to changes in mental function and behavior, and it can cause many physical effects that can be harmful.  Alcohol intoxication can range in severity from mild to very severe. Various factors can affect the level of intoxication that occurs, such as the person's age, gender, weight, frequency of alcohol consumption, and the presence of other medical conditions (such as diabetes, seizures, or heart conditions). Dangerous levels of alcohol intoxication may occur when people drink large amounts of alcohol in a short period (binge drinking). Alcohol can also be especially dangerous when combined with certain prescription medicines or "recreational" drugs. SIGNS AND SYMPTOMS Some common signs and symptoms of mild alcohol intoxication include:  Loss of coordination.  Changes in mood and behavior.  Impaired judgment.  Slurred speech. As alcohol intoxication progresses to more severe levels, other signs and symptoms will appear. These may include:  Vomiting.  Confusion and impaired memory.  Slowed breathing.  Seizures.  Loss of consciousness. DIAGNOSIS  Your health care provider will take a medical history and perform a physical exam. You will be asked about the amount and type of alcohol you have consumed. Blood tests will be done to measure the concentration of alcohol in your blood. In many places, your blood alcohol level must be lower than 80 mg/dL (6.44%0.08%) to legally drive. However, many dangerous effects of alcohol can occur at much lower levels.  TREATMENT  People with alcohol intoxication often do not require treatment. Most of the effects of alcohol intoxication are temporary, and they go away as the alcohol naturally  leaves the body. Your health care provider will monitor your condition until you are stable enough to go home. Fluids are sometimes given through an IV access tube to help prevent dehydration.  HOME CARE INSTRUCTIONS  Do not drive after drinking alcohol.  Stay hydrated. Drink enough water and fluids to keep your urine clear or pale yellow. Avoid caffeine.   Only take over-the-counter or prescription medicines as directed by your health care provider.  SEEK MEDICAL CARE IF:   You have persistent vomiting.   You do not feel better after a few days.  You have frequent alcohol intoxication. Your health care provider can help determine if you should see a substance use treatment counselor. SEEK IMMEDIATE MEDICAL CARE IF:   You become shaky or tremble when you try to stop drinking.   You shake uncontrollably (seizure).   You throw up (vomit) blood. This may be bright red or may look like black coffee grounds.   You have blood in your stool. This may be bright red or may appear as a black, tarry, bad smelling stool.   You become lightheaded or faint.  MAKE SURE YOU:   Understand these instructions.  Will watch your condition.  Will get help right away if you are not doing well or get worse.   This information is not intended to replace advice given to you by your health care provider. Make sure you discuss any questions you have with your health care provider.   Document Released: 04/24/2005 Document Revised: 03/17/2013 Document Reviewed: 12/18/2012 Elsevier Interactive Patient Education 2016 [employer]  Substance Abuse Treatment Programs  Intensive Outpatient Programs [facility]     [address]  [place], [place]                   [phone]       [facility] [address] [address] [place], [place] [phone]  [facility]     (Inpatient and outpatient)     [address]  Dr.           [phone]    [facility] [phone] (Suboxone and Methadone)  [address]      [place], [place] [address]      [phone]       [address] [place], [place] [phone]  [facility] (Outpatient/Inpatient, Chemical)    (insurance only) [phone]             [facility] (Groups & Residential) [place], [place] [phone]     [facility]     [address]     [place], [place]      [phone]       [facility] (for caregivers and family) [phone] [address] [place], [place] [phone]      Residential Treatment Programs [facility]      [address], [place], [place] [address]  [phone]       [facility] [address], [place], [place] [address] [phone]  [facility] [place]        [phone]       [facility] [phone]  [facility] (Add[facility]             [address]                                         [place], [place]                                                [phone] or [phone]                               [facility] [address] [place], [address] [phone]  [facility]    [address]      [place], [place]     [phone]       [facility] [address] [place], [place] [phone]  [facility]   [address] [place], [place] [address]     [phone]      Admissions: 8am-3pm M-F  [facility] (RTS) [address] [place], [place] [phone]  [facility] Program: Residential Program [phone] Days)   [place], [place]      [phone] or [phone]     [facility]: [facility] [place], [place] (Walk in Hours over the weekend or by referral)  [facility] [address] [place], [place], [place] [address] [phone]  Crisis Mobile: Therapeutic  Alternatives:  [phone] (for crisis response 24 hours a day) [facility] Hotline:      9567226069 Outpatient Psychiatry and Counseling  Therapeutic Alternatives: Mobile Crisis Management 24 hours:  [phone]  [facility] of the [place] sliding scale fee and walk in schedule: M-F 8am-12pm/1pm-3pm [address]  [employer]  [place], [place] [address] [phone]  [facility] [address] [place], [place] [address] [phone]  [facility] ([facility] new patient walk-in appointments available Monday - Friday 8am -3pm.          [address] [place], [place] [address] [phone] or crisis line- [phone]  [facility]/ Intensive Outpatient Therapy Program [address] [place], [place] [address] [phone]  [facility]                  Crisis Services      [phone]      [address]     [place], [place] [address]                 [facility]   [facility] [phone] [address] [place], [place] [address]   [facility]          [address] [place],  [place], [place] [address]       [phone]  [facility] [address] [place], [place] [address] [phone]  [facility]    [address]    [place], [place] [address]     [phone]       Andee PolesParish McKinney, MD     [address]     [place] [address]     [phone]       [facility] [address] [place] [address]  [facility]     [address]     [place], [place]      [phone]       [facility] Eulogio DitchShamsher Ahluwalia, MD [address] [place], [place] [address] [phone]  [facility]     [address]     [place], [place]  [address]     [phone]       [facility] [address] [place], [place] [address] [phone] Resources for Temporary Residential Assistance/Crisis Centers  [facility] CENT[facility]) M-F 8am-3pm   [address] [place], [place] [address]   [phone] Services include: laundry, barbering, support groups, case management, phone  & computer access, showers, AA/NA mtgs, mental health/substance abuse nurse, job skills class, disability information, VA assistance, spiritual classes, etc.   HOMELESS SHELTERS  [facility]     [facility]   [address], [place]     [phone]              [facility] (women and children)       [address] [place], [place] [address] [phone] [email or web address] for application and process Application Required  [facility]   [address]    [place] [address]     [phone]                    [facility] [address] [place] [place], [place] [address] [phone](schedule application appt.) Application Required  [facility] (women only)    [address]     [place], [place] [address]     [phone]      Intake starts 6pm daily Need valid ID, SSC, & Police report [facility] [place] [address] [place], [place] [phone] Application  Required  [facility] (men only)     [address]      [place], [place]     [phone]       [facility] (Pregnant women only) [address] [place], [place] [phone]  [facility]      [address]      [place], [place] [address]     [phone]             [facility] [address] [place], [place] [phone] 90 day commitment/SA/Application process  [facility](men only)     [address]     [place],  [place]     [phone]       Check-in at [facility]            C[facility] [address] [place], [place] [address] [phone] Men/Women/Women and Children must be there by 7 pm  [facility] [place], [place] [phone]

## 2015-07-07 NOTE — ED Notes (Signed)
Patient awake. Denies any discomforts. Calm and cooperative.

## 2015-07-29 ENCOUNTER — Encounter (HOSPITAL_COMMUNITY): Payer: Self-pay

## 2015-07-29 ENCOUNTER — Emergency Department (HOSPITAL_COMMUNITY)
Admission: EM | Admit: 2015-07-29 | Discharge: 2015-07-30 | Disposition: A | Discharge status: Home / Self Care | Payer: Medicare Other | Attending: Emergency Medicine | Admitting: Emergency Medicine

## 2015-07-29 DIAGNOSIS — Z87828 Personal history of other (healed) physical injury and trauma: Secondary | ICD-10-CM | POA: Diagnosis not present

## 2015-07-29 DIAGNOSIS — R011 Cardiac murmur, unspecified: Secondary | ICD-10-CM | POA: Insufficient documentation

## 2015-07-29 DIAGNOSIS — Z79899 Other long term (current) drug therapy: Secondary | ICD-10-CM | POA: Diagnosis not present

## 2015-07-29 DIAGNOSIS — R6883 Chills (without fever): Secondary | ICD-10-CM | POA: Diagnosis present

## 2015-07-29 DIAGNOSIS — Z86718 Personal history of other venous thrombosis and embolism: Secondary | ICD-10-CM | POA: Diagnosis not present

## 2015-07-29 DIAGNOSIS — R062 Wheezing: Secondary | ICD-10-CM | POA: Diagnosis not present

## 2015-07-29 DIAGNOSIS — Z8673 Personal history of transient ischemic attack (TIA), and cerebral infarction without residual deficits: Secondary | ICD-10-CM | POA: Insufficient documentation

## 2015-07-29 DIAGNOSIS — R6889 Other general symptoms and signs: Secondary | ICD-10-CM

## 2015-07-29 DIAGNOSIS — Z8669 Personal history of other diseases of the nervous system and sense organs: Secondary | ICD-10-CM | POA: Diagnosis not present

## 2015-07-29 DIAGNOSIS — F17210 Nicotine dependence, cigarettes, uncomplicated: Secondary | ICD-10-CM | POA: Insufficient documentation

## 2015-07-29 DIAGNOSIS — I10 Essential (primary) hypertension: Secondary | ICD-10-CM | POA: Insufficient documentation
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
## 2015-07-29 NOTE — ED Notes (Signed)
[facility] Expected date:  Expected time:  Means of arrival:  Comments: EMS 64 yo male found on side of road/cold and hungry

## 2015-07-29 NOTE — ED Notes (Signed)
EMS reports that pt reports [facility]. He was found laying on the sidewalk. He is complaining of being cold. A&Ox3. Possible ETOH although pt denies, EMS reports noting the smell of ETOH.

## 2015-07-29 NOTE — ED Provider Notes (Signed)
Medical screening examination/treatment/procedure(s) were conducted as a shared visit with non-physician practitioner(s) and myself.  I personally evaluated the patient during the encounter.   EKG Interpretation None      64 year old male brought in by EMS after found lying on side walk. History of CVA, TBI, HTN, and atrial fibrillation. States that he was walking home tonight in the cold, felt tired, and wanted to rest on the side walk. States that EMS found him and he did not want to come in. Concern for homelessness as patient is dressed in multiple layers, appears unkempt and disheveled. Denies etoh. Answering questions appropriately, grossly neuro in tact. Denies any complaints, and states he is hungry. Denies fever, chills, n/v/d, abd pain, trauma/falls, urinary complaints, headaches, vision/speech changes, or any other complaints. No work-up felt necessary. Observed and given food. Will discharge after.   Lavera Guiseana Duo Oswin Johal, MD 07/29/15 [phone]

## 2015-07-30 NOTE — Discharge Instructions (Signed)
Read the information below.  You may return to the Emergency Department at any time for worsening condition or any new symptoms that concern you. ° ° °Emergency Department Resource Guide °1) Find a Doctor and Pay Out of Pocket °Although you won't have to find out who is covered by your insurance plan, it is a good idea to ask around and get recommendations. You will then need to call the office and see if the doctor you have chosen will accept you as a new patient and what types of options they offer for patients who are self-pay. Some doctors offer discounts or will set up payment plans for their patients who do not have insurance, but you will need to ask so you aren't surprised when you get to your appointment. ° °2) Contact Your Local Health Department °Not all health departments have doctors that can see patients for sick visits, but many do, so it is worth a call to see if yours does. If you don't know where your local health department is, you can check in your phone book. The CDC also has a tool to help you locate your state's health department, and many state websites also have listings of all of their local health departments. ° °3) Find a Walk-in Clinic °If your illness is not likely to be very severe or complicated, you may want to try a walk in clinic. These are popping up all over the country in pharmacies, drugstores, and shopping centers. They're usually staffed by nurse practitioners or physician assistants that have been trained to treat common illnesses and complaints. They're usually fairly quick and inexpensive. However, if you have serious medical issues or chronic medical problems, these are probably not your best option. ° °No Primary Care Doctor: °- Call [facility] at  832-8000 - they can help you locate a primary care doctor that  accepts your insurance, provides certain services, etc. °- Physician Referral Service- 1-800-533-3463 ° °Chronic Pain Problems: °Organization          Address  Phone   Notes  °[facility]  (336) 297-2271 Patients need to be referred by their primary care doctor.  ° °Medication Assistance: °Organization         Address  Phone   Notes  °[place] [facility] [address] °[place], [place] [address] (336) 641-8030 --Must be a resident of [place] °-- Must have NO insurance coverage whatsoever (no Medicaid/ Medicare, etc.) °-- The pt. MUST have a primary care doctor that directs their care regularly and follows them in the community °  °[facility]  (866) 331-1348   °[employer]  (888) 892-1162   ° °Agencies that provide inexpensive medical care: °Organization         Address  Phone   Notes  °[facility]  (336) 832-8035   °[facility]    (336) 832-7272   °[facility] [address] °[place], [place] [address] (336) 832-4777   °[facility] [address], °[place] (336) 271-4999   °[facility]    (336) 373-0678   °[facility]    (336) 272-1050   °[facility] ° [address], [place] Phone:  (336) 832-4444, Fax:  (336) 832-4440 Hours of Operation:  9 am - 6 pm, M-F.  Also accepts Medicaid/Medicare and self-pay.  °[facility] ° [address],    Phone: (336) 832-3150, Fax: (336) 832-3151. Hours of Operation:  8:30 am - 5:30 pm, M-F.  Also accepts Medicaid and self-pay.  °[facility] [place] [address], [place] Phone: (336) 878-6027   °[facility] [address], [place], [place] (336)723-1848, Ext. 123 Mondays & Thursdays: 7-9 AM.  First 15 patients are seen on a first come, first serve basis. °  ° °Medicaid-accepting [place] Providers: ° °Organization         Address  Phone   Notes  °[facility] [address], [place] (336) 641-2100 Also accepts self-pay patients.  °[facility] [address], [place] ° (336) 856-9996   °[facility] [address], [place] (336) 288-8857   °[facility] [address] [place] [address], [place] (336) 299-7000   °Veita Bland [address], [place]  ° (336) 373-1557 Only accepts [place] Access Medicaid patients after they have their name applied to their card.  ° °Self-Pay (no insurance) in [place]: ° °Organization         Address  Phone   Notes  °Sickle Cell Patients, [facility] [address], [place] (336) 832-1970   °[facility] [address], [place] (336) 832-4400   °[facility] ° [address], [place] (336) 992-4800   °[facility]/Dr. Osei-Bonsu ° [address] [place] [address], [place] or [address], [place] (336) 841-8500 Phone number for both [place] and [place] locations is the same.  °[facility] [address], [place] (336) 299-0000   °[facility] [address] [place] [address], [place] or [address] (336) 852-7530 °(336) 878-2260   °[facility] [address], [place] (336) 350-1642, phone; (336) 294-5005, fax Sees patients 1st and 3rd Saturday of every month.  Must not qualify for public or private insurance (i.e. Medicaid, Medicare, [place] Health Choice, Veterans' Benefits) • Household income should be no more than 200% of the poverty level •The clinic cannot treat you if you are pregnant or think you are pregnant • Sexually transmitted diseases are not treated at the clinic.  ° ° °Dental Care: °Organization         Address  Phone  Notes  °[facility] [address], [place] (336) 641-6152 Accepts children up to age 21 who are enrolled in Medicaid or [place] Health Choice; pregnant women with a Medicaid card; and  children who have applied for Medicaid or [place] Health Choice, but were declined, whose parents can pay a reduced fee at time of service.  °[facility] [place]  [address], [place] (336) 641-7733 Accepts children up to age 21 who are enrolled in Medicaid or [place] Health Choice; pregnant women with a Medicaid card; and children who have applied for Medicaid or [place] Health Choice, but were declined, whose parents can pay a reduced fee at time of service.  °[facility] ° [address], [place] (336) 641-4533 Patients are seen by appointment only. Walk-ins are not accepted. [facility] will see patients 18 years of age and older. °Monday - Tuesday (8am-5pm) °Most Wednesdays (8:30-5pm) °$30 per visit, cash only  °[facility] ° [address], [place] (336) 641-4533 Patients are seen by appointment only. Walk-ins are not accepted. [facility] will see patients 18 years of age and older. °One Wednesday Evening (Monthly: Volunteer Based).  $30 per visit, cash only  °[facility]  (919) 537-3737 for adults; Children under age 4, call Graduate Pediatric Dentistry at ([phone]) [phone]. Children aged 4-14, please call (919) [phone] to request a pediatric application. ° Dental services are provided in all areas of dental care including fillings, crowns and bridges, complete and partial dentures, implants, gum treatment, root canals, and extractions. Preventive care is also provided. Treatment is provided to both adults and children. °Patients are selected via a lottery and there is often a waiting list. °  °[facility] [address], °[place] ° (336) 763-8833 [email or web address] °  °[facility] [address], [place], [place] (336)723-1848, Ext. 123 Second and Fourth Thursday of each month, opens at 6:30 AM; Clinic ends at 9 AM.  Patients are seen on a first-come first-served  basis, and a limited number are seen during each clinic.  ° °[facility] ° [address], [place], [place] (336) 723-7904   Eligibility Requirements °You must have lived in [place], [place], or [place] counties for at least the last three months. °  You cannot be eligible for state or federal sponsored healthcare insurance, including Veterans Administration, Medicaid, or Medicare. °  You generally cannot be eligible for healthcare insurance through your employer.  °  How to apply: °Eligibility screenings are held every Tuesday and Wednesday afternoon from 1:00 pm until 4:00 pm. You do not need an appointment for the interview!  °[facility] [address], [place], [place] 336-631-2330   °[facility]  336-342-8273   °[facility]  336-703-3100   ° [facility]  336-570-6415   ° °Behavioral Health Resources in the Community: °Intensive Outpatient Programs °Organization         Address  Phone  Notes  °[facility] [address], [place], [place] 336-878-6098   °[facility] [address], [place], [place] 336-832-9800   °[facility] [address], [place], [place] ° 336-882-2125   °[facility] [address],  °[place], [place] 1-800-853-5163 or 336-641-4981   °Substance Abuse Resources °Organization         Address  Phone  Notes  °[facility]  336-882-2125   °[facility]  336-784-9470   °[facility]  336-285-9073   °[facility]  336-845-3988   °Residential & Outpatient Substance Abuse Program  1-800-659-3381   °Psychological Services °Organization         Address  Phone  Notes  °[facility]  336- 832-9600   °[facility]  336- 378-7881   °[facility] [address], [place] 1-800-853-5163 or 336-641-4981   ° °Mobile Crisis Teams °Organization          Address  Phone  Notes  °[facility], Mobile Crisis Care Unit  1-877-626-1772   °Assertive °[facility] ° [address] [place],  336-834-9664   °Sharon DeEsch [address] °[place]  336-554-5454   ° °Self-Help/Support Groups °Organization         Address  Phone             Notes  °[facility] - variety of   support groups  336- 373-1402 Call for more information  °Narcotics Anonymous (NA), [facility] [address], °[place]  2 meetings at this location  ° °Residential Treatment Programs °Organization         Address  Phone  Notes  °[facility] [address],    °[place]  1-866-801-8205   °[facility] ° [address], [place], [place] 704-293-8524   °[facility] [address], [place] 336-845-3988 Admissions: 8am-3pm M-F  °[facility] [address]    °[place], [place] 336-841-1104   °[facility] [address], [place], [place] 336-379-7146   °[facility] [address]  °[place], [place] 336-285-9073   °[facility] - Intensive Outpatient [address], [place], [place] 336-852-3033   °[facility] [address]  °[place], [place] 1-877-615-2722 or 336-784-9470   °[facility] [address] [place], [place] 336-227-7417 Accepts Medicaid  °[facility] [address]  ° [place] 1-800-659-3381 Substance Abuse/Addiction Treatment  ° °[place] Behavioral Health Resources °Organization         Address  Phone  Notes  °[facility]  (888) 581-9988   °Julie Brannon, PhD [address] [place], [place]   (336) 349-5553 or (336) 951-0000   °[facility]   [address] °[place], [place] (336) 349-4454   °[facility] [address], [place], [place] (336) 342-8316 Insurance/Medicaid/sponsorship  through [facility]  °[facility] [address]                                    [place], [place] (336) 342-8316 Therapy/tele-psych/case  °[facility] [address]  ° [place], [place] (336) 349-2233    °Dr. Arfeen  (336) 349-4544   °[facility] [place]  [employer] [place] [facility] 1) [address],  °2) [address], [place] °3)  [address], [place] (336) 349-3220 °(336) 342-7768 ° °(336) 342-8140   °[place] [facility] (336) 342-1394 or (336) 342-3537 (After Hours)    ° ° ° °

## 2015-07-30 NOTE — ED Notes (Addendum)
Pt able to ambulate at baseline with walker.

## 2015-07-30 NOTE — ED Notes (Signed)
Pt finished a [place] sandwich and cup of juice

## 2015-08-02 IMAGING — CT CT HEAD W/O CM
1 series · 16 of 30 positions shown, 20 images · non-contrast
Comparison: Multiple priors, most recent 05/23/2014.

CLINICAL DATA: Altered mental status. Previous hemorrhage and
stroke. Prior treatment for hydrocephalus. Subsequent encounter.

EXAM:
CT HEAD WITHOUT CONTRAST
TECHNIQUE: Contiguous axial images were obtained from the base of the skull
through the vertex without contrast.

[Series 2: head 5.0 h30s · axial · 0.45mm/px · z∈[+1178,+1323]mm · 16 of 33 slices shown, 20 images]
[im 2/33  brain]
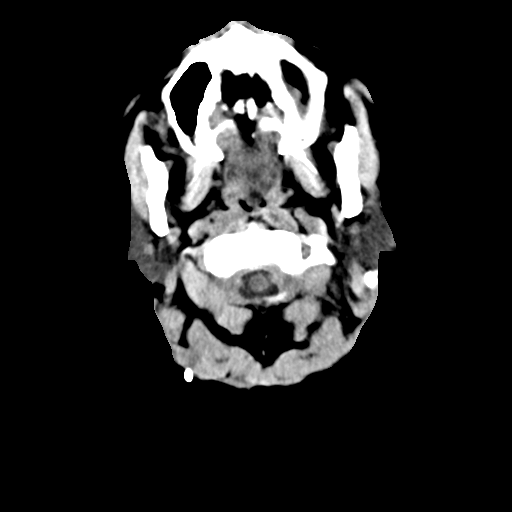
[im 2/33  bone]
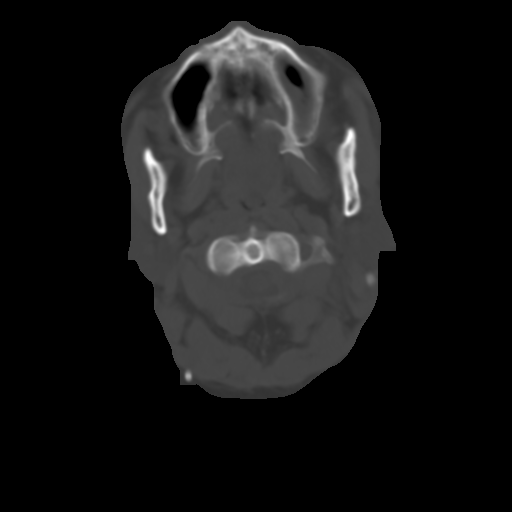
[im 4/33  brain]
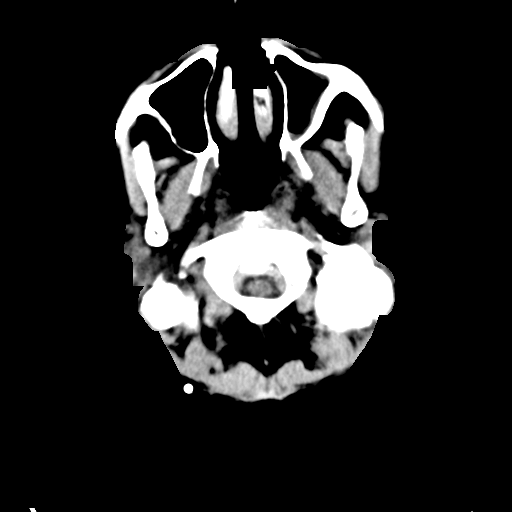
[im 6/33  brain]
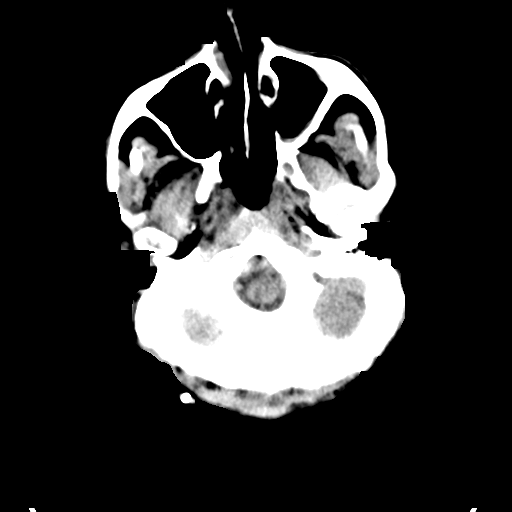
[im 8/33  brain]
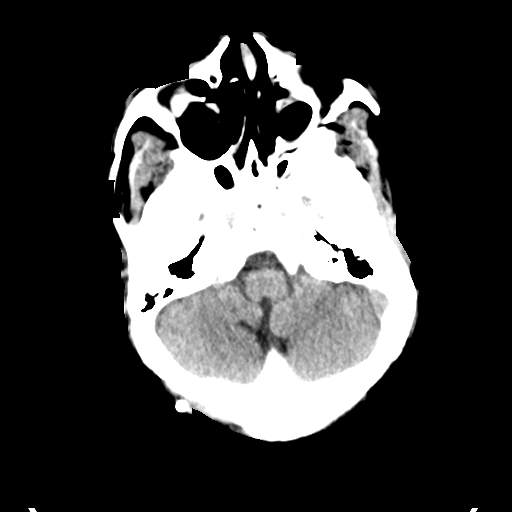
[im 9/33  brain]
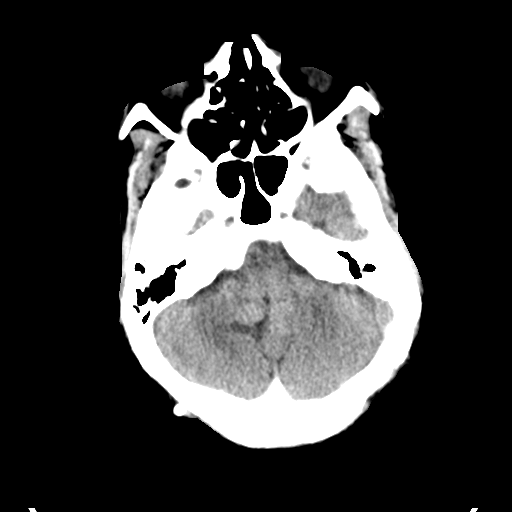
[im 9/33  bone]
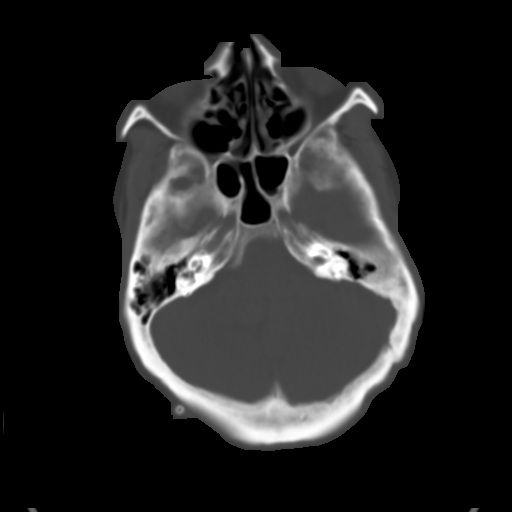
[im 12/33  brain]
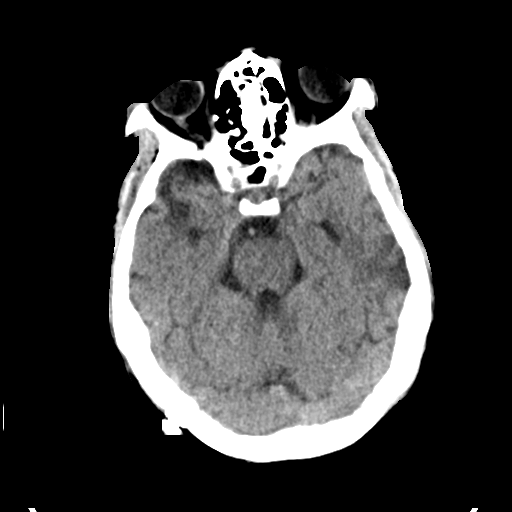
[im 14/33  brain]
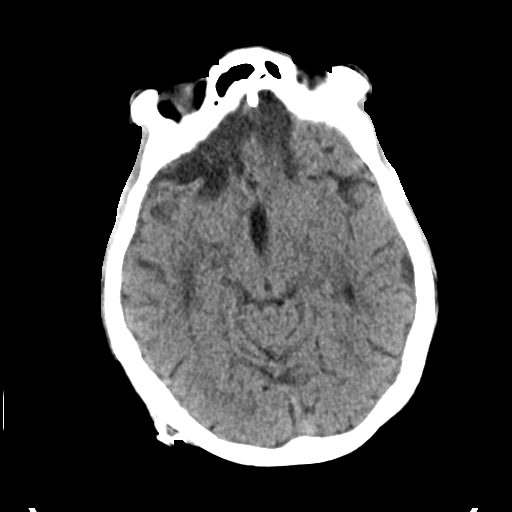
[im 16/33  brain]
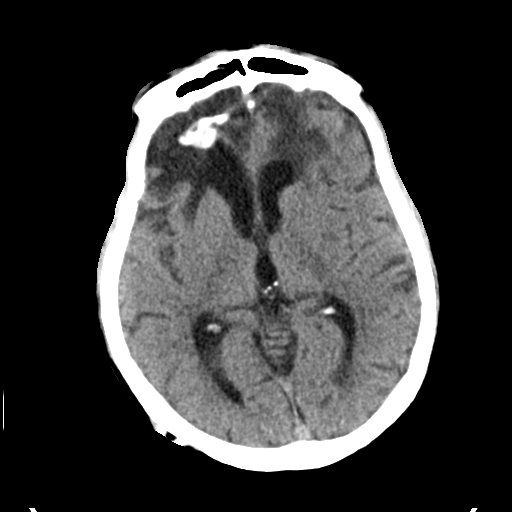
[im 17/33  brain]
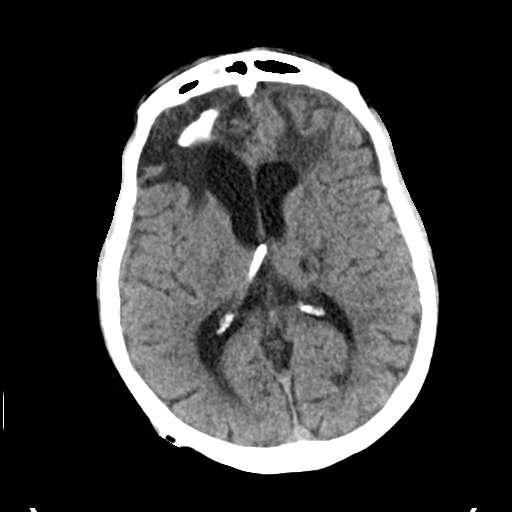
[im 17/33  bone]
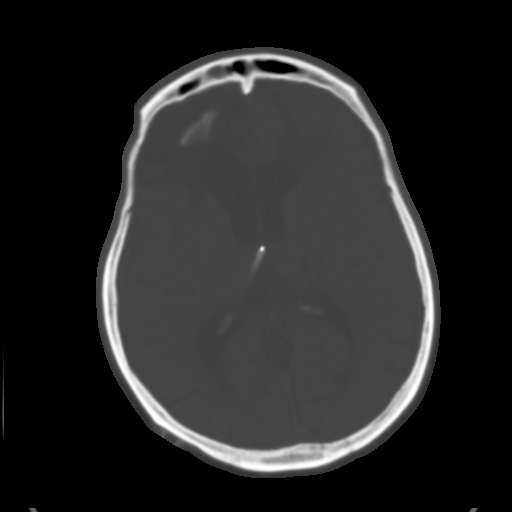
[im 19/33  brain]
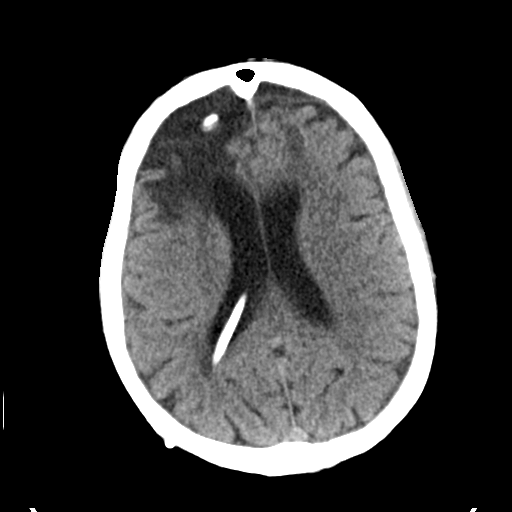
[im 21/33  brain]
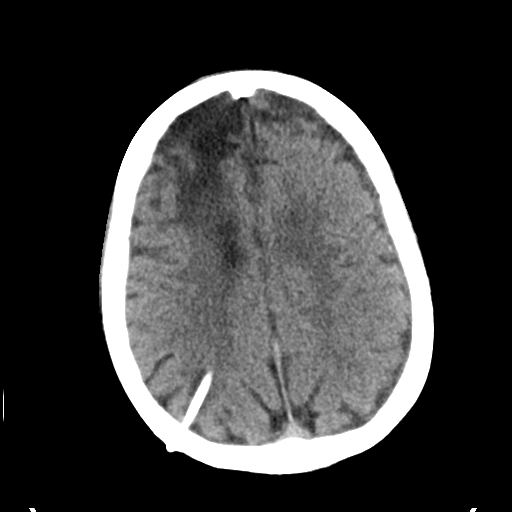
[im 24/33  brain]
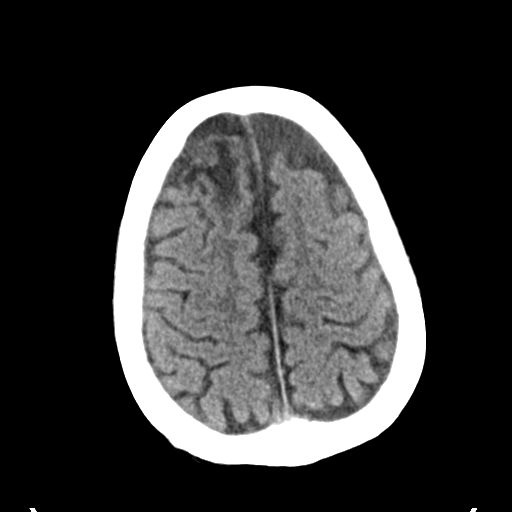
[im 25/33  brain]
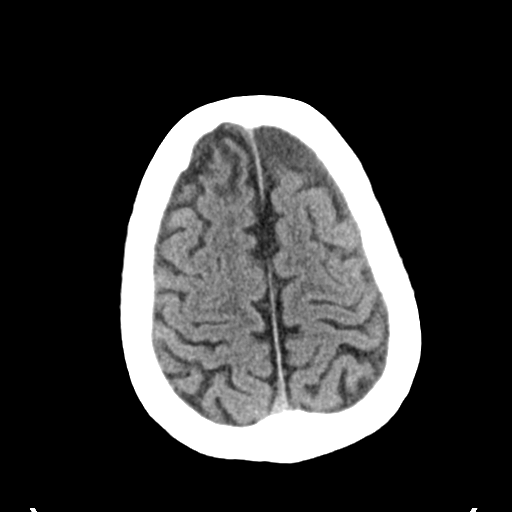
[im 25/33  bone]
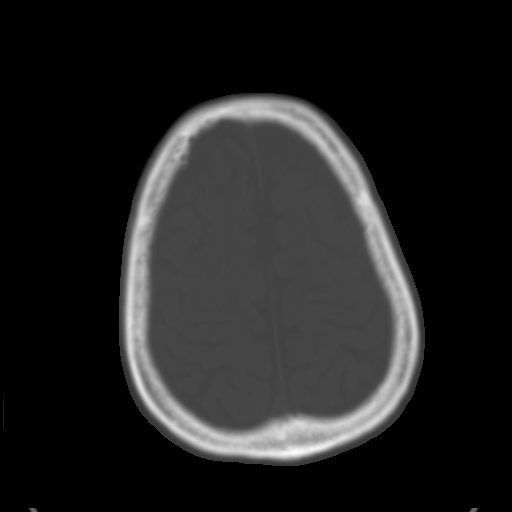
[im 27/33  brain]
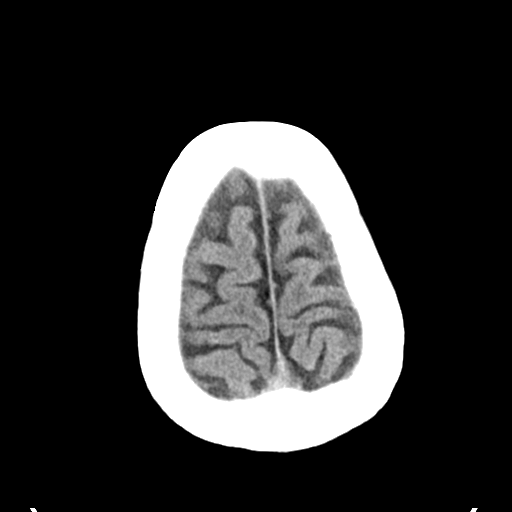
[im 29/33  brain]
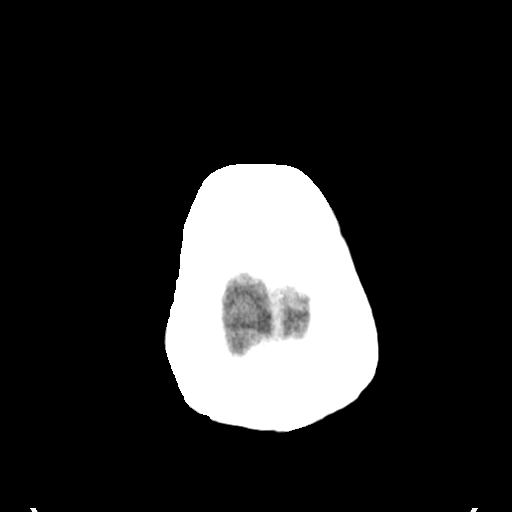
[im 31/33  brain]
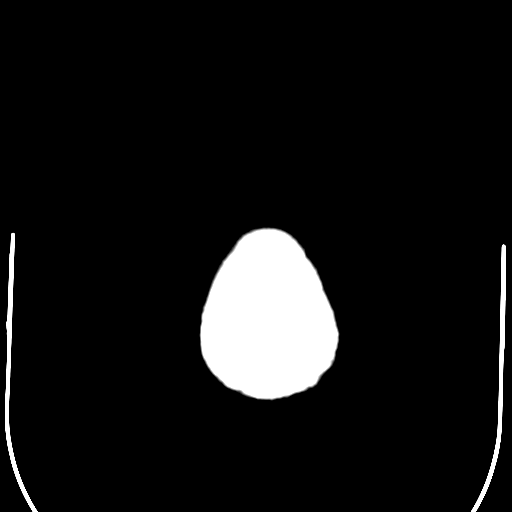

[16 of 30 positions shown; findings below may reference images not displayed]

FINDINGS: Large areas of brain substance loss affect the RIGHT greater than
LEFT frontal lobes, also BILATERAL temporal lobes with associated
gliosis. Chronic LEFT frontal extra-axial collection, likely
hygroma/hematoma. RIGHT occipital shunt catheter tip in good
position within the RIGHT lateral ventricle; no hydrocephalus.
Chronic RIGHT cerebellar infarct. No evidence for acute stroke or
hemorrhage. No midline shift. Calvarium otherwise intact. Chronic
sinus disease. Similar appearance to priors, with the exception of
improved LEFT occipital extra-axial collection.
IMPRESSION: Chronic changes as described. No acute abnormalities are evident in
comparison with most recent priors.

## 2015-08-26 ENCOUNTER — Inpatient Hospital Stay (HOSPITAL_COMMUNITY)
Admission: EM | Admit: 2015-08-26 | Discharge: 2015-08-29 | DRG: 923 | Disposition: A | Discharge status: Home / Self Care | Payer: Medicare Other | Attending: Family Medicine | Admitting: Family Medicine

## 2015-08-26 ENCOUNTER — Emergency Department (HOSPITAL_COMMUNITY): Payer: Medicare Other

## 2015-08-26 ENCOUNTER — Encounter (HOSPITAL_COMMUNITY): Payer: Self-pay | Admitting: Emergency Medicine

## 2015-08-26 DIAGNOSIS — R011 Cardiac murmur, unspecified: Secondary | ICD-10-CM | POA: Diagnosis present

## 2015-08-26 DIAGNOSIS — Z8673 Personal history of transient ischemic attack (TIA), and cerebral infarction without residual deficits: Secondary | ICD-10-CM

## 2015-08-26 DIAGNOSIS — Z5900 Homelessness unspecified: Secondary | ICD-10-CM

## 2015-08-26 DIAGNOSIS — E872 Acidosis: Secondary | ICD-10-CM | POA: Diagnosis present

## 2015-08-26 DIAGNOSIS — T68XXXA Hypothermia, initial encounter: Secondary | ICD-10-CM | POA: Diagnosis not present

## 2015-08-26 DIAGNOSIS — Z982 Presence of cerebrospinal fluid drainage device: Secondary | ICD-10-CM

## 2015-08-26 DIAGNOSIS — R748 Abnormal levels of other serum enzymes: Secondary | ICD-10-CM

## 2015-08-26 DIAGNOSIS — I4891 Unspecified atrial fibrillation: Secondary | ICD-10-CM | POA: Diagnosis present

## 2015-08-26 DIAGNOSIS — I10 Essential (primary) hypertension: Secondary | ICD-10-CM | POA: Diagnosis present

## 2015-08-26 DIAGNOSIS — R7989 Other specified abnormal findings of blood chemistry: Secondary | ICD-10-CM

## 2015-08-26 DIAGNOSIS — Y906 Blood alcohol level of 120-199 mg/100 ml: Secondary | ICD-10-CM | POA: Diagnosis present

## 2015-08-26 DIAGNOSIS — G40909 Epilepsy, unspecified, not intractable, without status epilepticus: Secondary | ICD-10-CM | POA: Diagnosis present

## 2015-08-26 DIAGNOSIS — Z87891 Personal history of nicotine dependence: Secondary | ICD-10-CM | POA: Diagnosis present

## 2015-08-26 DIAGNOSIS — F17210 Nicotine dependence, cigarettes, uncomplicated: Secondary | ICD-10-CM | POA: Diagnosis present

## 2015-08-26 DIAGNOSIS — Z86718 Personal history of other venous thrombosis and embolism: Secondary | ICD-10-CM

## 2015-08-26 DIAGNOSIS — M6282 Rhabdomyolysis: Secondary | ICD-10-CM

## 2015-08-26 DIAGNOSIS — Z720 Tobacco use: Secondary | ICD-10-CM

## 2015-08-26 DIAGNOSIS — F1010 Alcohol abuse, uncomplicated: Secondary | ICD-10-CM | POA: Diagnosis present

## 2015-08-26 DIAGNOSIS — X31XXXA Exposure to excessive natural cold, initial encounter: Secondary | ICD-10-CM

## 2015-08-26 DIAGNOSIS — E860 Dehydration: Secondary | ICD-10-CM | POA: Diagnosis present

## 2015-08-26 DIAGNOSIS — Z6821 Body mass index (BMI) 21.0-21.9, adult: Secondary | ICD-10-CM

## 2015-08-26 LAB — CBC WITH DIFFERENTIAL/PLATELET
BASOS ABS: 0 10*3/uL (ref 0.0–0.1)
BASOS PCT: 1 %
Eosinophils Absolute: 0.1 10*3/uL (ref 0.0–0.7)
Eosinophils Relative: 3 %
HCT: 39.8 % (ref 39.0–52.0)
Hemoglobin: 12.8 g/dL — ABNORMAL LOW (ref 13.0–17.0)
LYMPHS PCT: 41 %
Lymphs Abs: 1.9 10*3/uL (ref 0.7–4.0)
MCH: 30 pg (ref 26.0–34.0)
MCHC: 32.2 g/dL (ref 30.0–36.0)
MCV: 93.2 fL (ref 78.0–100.0)
MONO ABS: 0.4 10*3/uL (ref 0.1–1.0)
Monocytes Relative: 9 %
Neutro Abs: 2.2 10*3/uL (ref 1.7–7.7)
Neutrophils Relative %: 48 %
PLATELETS: 184 10*3/uL (ref 150–400)
RBC: 4.27 MIL/uL (ref 4.22–5.81)
RDW: 14.7 % (ref 11.5–15.5)
WBC: 4.6 10*3/uL (ref 4.0–10.5)

## 2015-08-26 LAB — LACTIC ACID, PLASMA
LACTIC ACID, VENOUS: 2.6 mmol/L — AB (ref 0.5–2.0)
LACTIC ACID, VENOUS: 3.6 mmol/L — AB (ref 0.5–2.0)
Lactic Acid, Venous: 1.6 mmol/L (ref 0.5–2.0)
Lactic Acid, Venous: 1.1 mmol/L (ref 0.5–2.0)

## 2015-08-26 LAB — BASIC METABOLIC PANEL
ANION GAP: 15 (ref 5–15)
BUN: 16 mg/dL (ref 6–20)
CALCIUM: 8.6 mg/dL — AB (ref 8.9–10.3)
CO2: 20 mmol/L — ABNORMAL LOW (ref 22–32)
Chloride: 110 mmol/L (ref 101–111)
Creatinine, Ser: 0.88 mg/dL (ref 0.61–1.24)
GFR calc Af Amer: >60 mL/min (ref >60)
GLUCOSE: 87 mg/dL (ref 65–99)
POTASSIUM: 4.4 mmol/L (ref 3.5–5.1)
SODIUM: 145 mmol/L (ref 135–145)

## 2015-08-26 LAB — ETHANOL: ALCOHOL ETHYL (B): 146 mg/dL — AB (ref <5)

## 2015-08-26 LAB — PHENYTOIN LEVEL, TOTAL

## 2015-08-26 LAB — CK: CK TOTAL: 1217 U/L — AB (ref 49–397)

## 2015-08-26 MED ORDER — VITAMIN B-1 100 MG PO TABS
100.0000 mg | ORAL_TABLET | Freq: Every day | ORAL | Status: DC
  Administered 2015-08-26 – 2015-08-29 (×4): 100 mg via ORAL
  Filled 2015-08-26 (×4): qty 1

## 2015-08-26 MED ORDER — FOLIC ACID 1 MG PO TABS
1.0000 mg | ORAL_TABLET | Freq: Every day | ORAL | Status: DC
  Administered 2015-08-26 – 2015-08-29 (×4): 1 mg via ORAL
  Filled 2015-08-26 (×4): qty 1

## 2015-08-26 MED ORDER — LORAZEPAM 1 MG PO TABS
1.0000 mg | ORAL_TABLET | Freq: Four times a day (QID) | ORAL | Status: DC | PRN
  Administered 2015-08-27 – 2015-08-28 (×2): 1 mg via ORAL
  Filled 2015-08-26 (×2): qty 1

## 2015-08-26 MED ORDER — SODIUM CHLORIDE 0.9 % IV BOLUS (SEPSIS)
1000.0000 mL | Freq: Once | INTRAVENOUS | Status: AC
  Administered 2015-08-26: 1000 mL via INTRAVENOUS

## 2015-08-26 MED ORDER — ONDANSETRON HCL 4 MG PO TABS: 4.0000 mg | ORAL_TABLET | Freq: Four times a day (QID) | ORAL | Status: DC | PRN

## 2015-08-26 MED ORDER — SENNOSIDES-DOCUSATE SODIUM 8.6-50 MG PO TABS: 1.0000 | ORAL_TABLET | Freq: Every evening | ORAL | Status: DC | PRN

## 2015-08-26 MED ORDER — ONDANSETRON HCL 4 MG/2ML IJ SOLN: 4.0000 mg | Freq: Four times a day (QID) | INTRAMUSCULAR | Status: DC | PRN

## 2015-08-26 MED ORDER — THIAMINE HCL 100 MG/ML IJ SOLN: 100.0000 mg | Freq: Every day | INTRAMUSCULAR | Status: DC

## 2015-08-26 MED ORDER — ENOXAPARIN SODIUM 40 MG/0.4ML ~~LOC~~ SOLN
40.0000 mg | SUBCUTANEOUS | Status: DC
  Administered 2015-08-26 – 2015-08-28 (×3): 40 mg via SUBCUTANEOUS
  Filled 2015-08-26 (×3): qty 0.4

## 2015-08-26 MED ORDER — LORAZEPAM 2 MG/ML IJ SOLN: 1.0000 mg | Freq: Four times a day (QID) | INTRAMUSCULAR | Status: DC | PRN

## 2015-08-26 MED ORDER — ASPIRIN EC 81 MG PO TBEC
81.0000 mg | DELAYED_RELEASE_TABLET | Freq: Every day | ORAL | Status: DC
  Administered 2015-08-26 – 2015-08-29 (×4): 81 mg via ORAL
  Filled 2015-08-26 (×4): qty 1

## 2015-08-26 MED ORDER — SODIUM CHLORIDE 0.9 % IV SOLN
INTRAVENOUS | Status: DC
  Administered 2015-08-26 – 2015-08-29 (×4): via INTRAVENOUS

## 2015-08-26 MED ORDER — ADULT MULTIVITAMIN W/MINERALS CH
1.0000 | ORAL_TABLET | Freq: Every day | ORAL | Status: DC
  Administered 2015-08-26 – 2015-08-29 (×4): 1 via ORAL
  Filled 2015-08-26 (×4): qty 1

## 2015-08-26 MED ORDER — BISACODYL 10 MG RE SUPP: 10.0000 mg | Freq: Every day | RECTAL | Status: DC | PRN

## 2015-08-26 MED ORDER — PHENYTOIN SODIUM EXTENDED 100 MG PO CAPS
300.0000 mg | ORAL_CAPSULE | Freq: Every day | ORAL | Status: DC
  Administered 2015-08-26: 300 mg via ORAL
  Filled 2015-08-26: qty 3

## 2015-08-26 MED ORDER — SODIUM CHLORIDE 0.9 % IV SOLN
Freq: Once | INTRAVENOUS | Status: AC
  Administered 2015-08-26: 125 mL/h via INTRAVENOUS

## 2015-08-26 NOTE — ED Notes (Signed)
Ordered diet tray 

## 2015-08-26 NOTE — ED Notes (Signed)
Pt states he does not drink ETOH every day-- but drank a pint of vodka last night. Denies having any type of withdrawal symptoms when not drinking. CIWA = 4.

## 2015-08-26 NOTE — ED Notes (Signed)
Lab called with lactic acid result.

## 2015-08-26 NOTE — Progress Notes (Signed)
CSW received consult to speak with patient regarding alcohol use. CSW went to assess patient, however patient was not in the room. Per RN, patient has been transferred to an inpatient unit. Unit CSW will continue to follow and provide support while in hospital.   Fernande Boyden, [facility] Clinical Social Worker [facility] Ph: [phone]

## 2015-08-26 NOTE — ED Notes (Signed)
Ordered lunch tray 
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
## 2015-08-26 NOTE — ED Notes (Signed)
Call CM @ [phone]

## 2015-08-26 NOTE — ED Provider Notes (Signed)
Medical screening examination/treatment/procedure(s) were conducted as a shared visit with non-physician practitioner(s) and myself.  I personally evaluated the patient during the encounter.   EKG Interpretation   Date/Time:  Saturday August 26 2015 09:05:42 EST Ventricular Rate:  90 PR Interval:  107 QRS Duration: 93 QT Interval:  404 QTC Calculation: 494 R Axis:   71 Text Interpretation:  Sinus or ectopic atrial rhythm Short PR interval Low  voltage, precordial leads Borderline prolonged QT interval Confirmed by  Deretha Emory  MD, Wilberto Console [phone]) on 08/26/2015 9:11:40 AM      Results for orders placed or performed during the hospital encounter of 08/26/15  Ethanol  Result Value Ref Range   Alcohol, Ethyl (B) 146 (H) <5 mg/dL  Basic metabolic panel  Result Value Ref Range   Sodium 145 135 - 145 mmol/L   Potassium 4.4 3.5 - 5.1 mmol/L   Chloride 110 101 - 111 mmol/L   CO2 20 (L) 22 - 32 mmol/L   Glucose, Bld 87 65 - 99 mg/dL   BUN 16 6 - 20 mg/dL   Creatinine, Ser 1.91 0.61 - 1.24 mg/dL   Calcium 8.6 (L) 8.9 - 10.3 mg/dL   GFR calc non Af Amer >60 >60 mL/min   GFR calc Af Amer >60 >60 mL/min   Anion gap 15 5 - 15  CBC with Differential  Result Value Ref Range   WBC 4.6 4.0 - 10.5 K/uL   RBC 4.27 4.22 - 5.81 MIL/uL   Hemoglobin 12.8 (L) 13.0 - 17.0 g/dL   HCT 47.8 29.5 - 62.1 %   MCV 93.2 78.0 - 100.0 fL   MCH 30.0 26.0 - 34.0 pg   MCHC 32.2 30.0 - 36.0 g/dL   RDW 30.8 65.7 - 84.6 %   Platelets 184 150 - 400 K/uL   Neutrophils Relative % 48 %   Neutro Abs 2.2 1.7 - 7.7 K/uL   Lymphocytes Relative 41 %   Lymphs Abs 1.9 0.7 - 4.0 K/uL   Monocytes Relative 9 %   Monocytes Absolute 0.4 0.1 - 1.0 K/uL   Eosinophils Relative 3 %   Eosinophils Absolute 0.1 0.0 - 0.7 K/uL   Basophils Relative 1 %   Basophils Absolute 0.0 0.0 - 0.1 K/uL  Lactic acid, plasma  Result Value Ref Range   Lactic Acid, Venous 2.6 (HH) 0.5 - 2.0 mmol/L  CK  Result Value Ref Range   Total CK 1217  (H) 49 - 397 U/L    Patient seen by me. Patient brought in by EMS. Patient with a history of alcohol abuse several visits for alcohol-related problems. Brought in by EMS patient was cold to touch and shivering. Patient initially was felt to be a respirator distress by EMS and was received albuterol and also of started on C Pap but then taken off. Patient upon arrival here was more awake. Patient still feeling cold. Initial temperature was 91.4. Patient placed on warming blanket. Oxygen saturation were okay. Patient's blood alcohol level is elevated in the 140s. Patient did have an elevated CK of the 1200. Patient's kidney function is normal. No significant CBC abnormalities. Patient's temperature is now 95.1. Warming blanket is working well however patient will require admission until he is back to normal temperature. Patient also will need to have CKs followed. Initial lactic acid was elevated need serial lactic acidosis as well. Patient received 1 L of normal saline here is receiving IV maintenance fluid.  Vanetta Mulders, MD 08/26/15 [id number]

## 2015-08-27 DIAGNOSIS — Z8673 Personal history of transient ischemic attack (TIA), and cerebral infarction without residual deficits: Secondary | ICD-10-CM | POA: Diagnosis not present

## 2015-08-27 DIAGNOSIS — G40909 Epilepsy, unspecified, not intractable, without status epilepticus: Secondary | ICD-10-CM

## 2015-08-27 DIAGNOSIS — R4182 Altered mental status, unspecified: Secondary | ICD-10-CM | POA: Diagnosis not present

## 2015-08-27 DIAGNOSIS — M6282 Rhabdomyolysis: Secondary | ICD-10-CM | POA: Diagnosis present

## 2015-08-27 DIAGNOSIS — F1010 Alcohol abuse, uncomplicated: Secondary | ICD-10-CM | POA: Diagnosis present

## 2015-08-27 DIAGNOSIS — T68XXXD Hypothermia, subsequent encounter: Secondary | ICD-10-CM

## 2015-08-27 DIAGNOSIS — I4891 Unspecified atrial fibrillation: Secondary | ICD-10-CM | POA: Diagnosis present

## 2015-08-27 DIAGNOSIS — Z6821 Body mass index (BMI) 21.0-21.9, adult: Secondary | ICD-10-CM | POA: Diagnosis not present

## 2015-08-27 DIAGNOSIS — R011 Cardiac murmur, unspecified: Secondary | ICD-10-CM | POA: Diagnosis present

## 2015-08-27 DIAGNOSIS — R569 Unspecified convulsions: Secondary | ICD-10-CM | POA: Diagnosis not present

## 2015-08-27 DIAGNOSIS — Z720 Tobacco use: Secondary | ICD-10-CM | POA: Diagnosis not present

## 2015-08-27 DIAGNOSIS — Z590 Homelessness: Secondary | ICD-10-CM

## 2015-08-27 DIAGNOSIS — Z982 Presence of cerebrospinal fluid drainage device: Secondary | ICD-10-CM | POA: Diagnosis not present

## 2015-08-27 DIAGNOSIS — X31XXXA Exposure to excessive natural cold, initial encounter: Secondary | ICD-10-CM | POA: Diagnosis not present

## 2015-08-27 DIAGNOSIS — T68XXXA Hypothermia, initial encounter: Secondary | ICD-10-CM | POA: Diagnosis present

## 2015-08-27 DIAGNOSIS — Y906 Blood alcohol level of 120-199 mg/100 ml: Secondary | ICD-10-CM | POA: Diagnosis present

## 2015-08-27 DIAGNOSIS — E872 Acidosis: Secondary | ICD-10-CM | POA: Diagnosis present

## 2015-08-27 DIAGNOSIS — R748 Abnormal levels of other serum enzymes: Secondary | ICD-10-CM | POA: Diagnosis present

## 2015-08-27 DIAGNOSIS — F17210 Nicotine dependence, cigarettes, uncomplicated: Secondary | ICD-10-CM | POA: Diagnosis present

## 2015-08-27 DIAGNOSIS — E860 Dehydration: Secondary | ICD-10-CM | POA: Diagnosis present

## 2015-08-27 DIAGNOSIS — Z86718 Personal history of other venous thrombosis and embolism: Secondary | ICD-10-CM | POA: Diagnosis not present

## 2015-08-27 DIAGNOSIS — I10 Essential (primary) hypertension: Secondary | ICD-10-CM | POA: Diagnosis present

## 2015-08-27 LAB — BASIC METABOLIC PANEL
Anion gap: 6 (ref 5–15)
BUN: 9 mg/dL (ref 6–20)
CHLORIDE: 111 mmol/L (ref 101–111)
CO2: 25 mmol/L (ref 22–32)
CREATININE: 1.06 mg/dL (ref 0.61–1.24)
Calcium: 8.2 mg/dL — ABNORMAL LOW (ref 8.9–10.3)
GFR calc Af Amer: >60 mL/min (ref >60)
GFR calc non Af Amer: >60 mL/min (ref >60)
GLUCOSE: 98 mg/dL (ref 65–99)
Potassium: 4.1 mmol/L (ref 3.5–5.1)
SODIUM: 142 mmol/L (ref 135–145)

## 2015-08-27 LAB — CBC
HCT: 36 % — ABNORMAL LOW (ref 39.0–52.0)
Hemoglobin: 11.6 g/dL — ABNORMAL LOW (ref 13.0–17.0)
MCH: 30.1 pg (ref 26.0–34.0)
MCHC: 32.2 g/dL (ref 30.0–36.0)
MCV: 93.3 fL (ref 78.0–100.0)
PLATELETS: 180 10*3/uL (ref 150–400)
RBC: 3.86 MIL/uL — ABNORMAL LOW (ref 4.22–5.81)
RDW: 15 % (ref 11.5–15.5)
WBC: 6.1 10*3/uL (ref 4.0–10.5)

## 2015-08-27 MED ORDER — PHENOBARBITAL 32.4 MG PO TABS
64.8000 mg | ORAL_TABLET | Freq: Every day | ORAL | Status: DC
  Administered 2015-08-27 – 2015-08-28 (×2): 64.8 mg via ORAL
  Filled 2015-08-27 (×2): qty 2

## 2015-08-27 MED ORDER — PHENOBARBITAL 64.8 MG PO TABS: 64.8000 mg | ORAL_TABLET | Freq: Every day | ORAL | Status: DC

## 2015-08-27 NOTE — Progress Notes (Signed)
[facility] PROGRESS NOTE  Miguel [name] [id number]:[id number] DOB: 10-Jan-1951 DOA: 08/26/2015 PCP: [facility]  Assessment/Plan: 1. Rhabdomyolysis- patient had mild CPK elevation of 1217, started on IV fluids. Will check CPK in a.m. 2. Seizure disorder- patient was taking Dilantin 300 mg by mouth daily at bedtime. Dilantin level in the hospital was less than 2.5. I called and discussed with neurologist on call who will see the patient and make changes in patients medications. Currently patient is on Dilantin 300 mg by mouth daily. 3. Alcohol abuse- no signs and symptoms of alcohol withdrawal, continue CIWA protocol. 4. Elevated lactic acid-resolved, likely from dehydration. This morning repeat lactic acid is 1.1 5. DVT prophylaxis- Lovenox    Code Status: Full code Family Communication: *No family present at bedside Disposition Plan: Pending improvement in dehydration/adjustment of seizure medication   Consultants:  Neurology  Procedures:  None  Antibiotics:  None  HPI/Subjective: 65 yo M with history of homelessness, seizure disorder, and ETOH abuse who presents after being found by bystanders sleeping (or passed out) on the ground in 28-degree F weather. He had drunk a pint of liquor the night before. He denied any recent symptoms of infection. His VS were notable for an initial temperature of 91.4 and initially he was in respiratory distress which was relieved with albuterol. Bair hugger placed for four hours in the ER and his temperature normalized. Labs were notable for WBC 4.6, neg CXR and UA. Alcohol level 146. Lactic acid has risen a somewhat but he had only received one liter of IVF.  Patient feels better this morning  Objective: Filed Vitals:   08/26/15 2220 08/27/15 0520  BP: 129/81 134/80  Pulse: 91 92  Temp: 100 F (37.8 C) 99 F (37.2 C)  Resp:  16    Intake/Output Summary (Last 24 hours) at 08/27/15 1206 Last data filed at 08/27/15 1137  Gross per 24 hour  Intake   1602 ml  Output   2375 ml  Net   -773 ml   Filed Weights   08/26/15 0850 08/26/15 1550  Weight: 72.122 kg (159 lb) 73.074 kg (161 lb 1.6 oz)    Exam:   General:  *Appears in no acute distress  Cardiovascular: S1-S2 regular  Respiratory: Clear to auscultation bilaterally  Abdomen: Soft, nontender, no organomegaly  Musculoskeletal: No cyanosis/clubbing/edema of the lower extremities   Data Reviewed: Basic Metabolic Panel:  Recent Labs Lab 08/26/15 0913 08/27/15 0622  NA 145 142  K 4.4 4.1  CL 110 111  CO2 20* 25  GLUCOSE 87 98  BUN 16 9  CREATININE 0.88 1.06  CALCIUM 8.6* 8.2*   CBC:  Recent Labs Lab 08/26/15 0913 08/27/15 0622  WBC 4.6 6.1  NEUTROABS 2.2  --   HGB 12.8* 11.6*  HCT 39.8 36.0*  MCV 93.2 93.3  PLT 184 180   Cardiac Enzymes:  Recent Labs Lab 08/26/15 0913  CKTOTAL 1217*     Studies: Dg Chest 2 View  08/26/2015  CLINICAL DATA:  Shortness of breath.  Cold exposure, wheezing. EXAM: CHEST  2 VIEW COMPARISON:  04/04/2015 FINDINGS: Heart and mediastinal contours are within normal limits. No focal opacities or effusions. No acute bony abnormality. IMPRESSION: No active cardiopulmonary disease. Electronically Signed   By: Charlett Nose M.D.   On: 08/26/2015 09:50    Scheduled Meds: . aspirin EC  81 mg Oral Daily  . enoxaparin (LOVENOX) injection  40 mg Subcutaneous Q24H  . folic acid  1 mg Oral  Daily  . multivitamin with minerals  1 tablet Oral Daily  . phenytoin  300 mg Oral QHS  . thiamine  100 mg Oral Daily   Or  . thiamine  100 mg Intravenous Daily   Continuous Infusions: . sodium chloride 100 mL/hr at 08/26/15 1558    Principal Problem:   Hypothermia Active Problems:   Alcohol abuse   Tobacco abuse   Homelessness   S/P ventriculoperitoneal shunt   Seizure disorder (HCC)   Rhabdomyolysis    Time spent: 25 min    [facility] S  [facility] Pager [phone]*. If 7PM-7AM, please  contact night-coverage at [email or web address], password [facility] 08/27/2015, 12:06 PM
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
## 2015-08-27 NOTE — Care Management Obs Status (Signed)
MEDICARE OBSERVATION STATUS NOTIFICATION   Patient Details  Name: Miguel [name] MRN: [id number] Date of Birth: Oct 02, 1950   Medicare Observation Status Notification Given:  Yes    Antony Haste, RN 08/27/2015, 9:35 AM

## 2015-08-28 ENCOUNTER — Inpatient Hospital Stay (HOSPITAL_COMMUNITY): Payer: Medicare Other

## 2015-08-28 DIAGNOSIS — R4182 Altered mental status, unspecified: Secondary | ICD-10-CM

## 2015-08-28 DIAGNOSIS — M6282 Rhabdomyolysis: Secondary | ICD-10-CM

## 2015-08-28 DIAGNOSIS — F1010 Alcohol abuse, uncomplicated: Secondary | ICD-10-CM

## 2015-08-28 LAB — CK: Total CK: 313 U/L (ref 49–397)

## 2015-08-28 MED ORDER — METOPROLOL TARTRATE 25 MG PO TABS
25.0000 mg | ORAL_TABLET | Freq: Two times a day (BID) | ORAL | Status: DC
  Administered 2015-08-28 – 2015-08-29 (×3): 25 mg via ORAL
  Filled 2015-08-28 (×2): qty 1

## 2015-08-28 NOTE — Evaluation (Signed)
Physical Therapy Evaluation Patient Details Name: Miguel [name] MRN: [id number] DOB: 10-06-1950 Today's Date: 08/28/2015   History of Present Illness  Pt admitted with hypthermia after being found on ground in a parking lot. PMhx: CVA, Sz, Afib, HTN  Clinical Impression  Pt incontinent on arrival with all linens thoroughly soaked and pt stated he had not notified staff but was aware. Pt with generalized weakness RLE >LLE, impaired balance, decreased activity tolerance, impaired transfers and requires assist and RW for safe function at this time. Pt will benefit from acute therapy to maximize mobility, gait, strength and independence to decrease burden of care and fall risk.  Pt reports he may be able to stay with dgtr who works. Recommend ST-SNF for D/C.     Follow Up Recommendations SNF;Supervision for mobility/OOB    Equipment Recommendations  Rolling walker with 5" wheels    Recommendations for Other Services       Precautions / Restrictions Precautions Precautions: Fall      Mobility  Bed Mobility Overal bed mobility: Needs Assistance Bed Mobility: Supine to Sit     Supine to sit: Min assist     General bed mobility comments: assist to scoot fully to EOB, right lean with transfer with increased time to achieve upright posture, reliance on rail   Transfers Overall transfer level: Needs assistance   Transfers: Sit to/from Stand Sit to Stand: Min assist         General transfer comment: cues for hand placement and safety with assist to fully rise and for balance  Ambulation/Gait Ambulation/Gait assistance: Min guard Ambulation Distance (Feet): 140 Feet Assistive device: Rolling walker (2 wheeled) Gait Pattern/deviations: Step-through pattern;Decreased stride length;Trunk flexed   Gait velocity interpretation: Below normal speed for age/gender General Gait Details: cues for posture, position in RW and safety, Without RW required min assist for gait  Stairs             Wheelchair Mobility    Modified Rankin (Stroke Patients Only)       Balance Overall balance assessment: Needs assistance   Sitting balance-Leahy Scale: Fair       Standing balance-Leahy Scale: Poor                               Pertinent Vitals/Pain Pain Assessment: 0-10 Pain Score: 4  Pain Location: left hip Pain Descriptors / Indicators: Aching Pain Intervention(s): Limited activity within patient's tolerance;Repositioned    Home Living Family/patient expects to be discharged to:: Shelter/Homeless                      Prior Function Level of Independence: Independent         Comments: pt states he doesn't have DME and was sleeping outside with a blanket     Hand Dominance        Extremity/Trunk Assessment   Upper Extremity Assessment: Overall WFL for tasks assessed           Lower Extremity Assessment: Generalized weakness;RLE deficits/detail;LLE deficits/detail RLE Deficits / Details: hip flexion 3/5, knee flexion 3/5, knee extension 4/5 LLE Deficits / Details: hip flexion 2/5, knee extension 4/5, knee flexion 4/5  Cervical / Trunk Assessment: Normal  Communication   Communication: No difficulties  Cognition Arousal/Alertness: Awake/alert Behavior During Therapy: Flat affect Overall Cognitive Status: Impaired/Different from baseline Area of Impairment: Following commands;Problem solving       Following Commands: Follows one step  commands with increased time     Problem Solving: Slow processing      General Comments      Exercises        Assessment/Plan    PT Assessment Patient needs continued PT services  PT Diagnosis Difficulty walking;Generalized weakness;Altered mental status   PT Problem List Decreased strength;Decreased activity tolerance;Decreased balance;Decreased mobility;Decreased knowledge of use of DME;Decreased safety awareness  PT Treatment Interventions Gait training;Functional  mobility training;Therapeutic activities;Therapeutic exercise;Balance training;Patient/family education;DME instruction   PT Goals (Current goals can be found in the Care Plan section) Acute Rehab PT Goals Patient Stated Goal: be able to walk around PT Goal Formulation: With patient Time For Goal Achievement: 09/11/15 Potential to Achieve Goals: Fair    Frequency Min 3X/week   Barriers to discharge Decreased caregiver support      Co-evaluation               End of Session Equipment Utilized During Treatment: Gait belt Activity Tolerance: Patient tolerated treatment well Patient left: Other (comment) (in bathroom, with call bell and NT in room) Nurse Communication: Mobility status;Precautions         Time: 1204-1229 PT Time Calculation (min) (ACUTE ONLY): 25 min   Charges:   PT Evaluation $PT Eval Moderate Complexity: 1 Procedure PT Treatments $Gait Training: 8-22 mins   PT G CodesDelorse Lek 08/28/2015, 12:38 PM Delaney Meigs, PT [phone]

## 2015-08-28 NOTE — Progress Notes (Signed)
EEG Completed; Results Pending  

## 2015-08-28 NOTE — Procedures (Signed)
HPI:  65 y.o. male patient who is admitted after being found to be hypothermic. Patient has known alcohol abuse history, and prior seizures. Unclear if these are alcohol withdrawal seizures. He was supposedly on Dilantin 300 mg at bedtime from the EMR review had very low Dilantin levels at admission. Compliance unknown. Denies any medical follow-up. per history from patient, his last seizure was 2 weeks ago. He reports drinking alcohol on a daily basis.  No family members available at bedside  TECHNICAL SUMMARY:  A multi-channel referential and bipolar montage EEG using the standard international 10-20 system was performed on the patient described as drowsy and asleep.  During the brief period of the recording in which the patient is awake, there is an 8-9 hertz background activity.  Most of the recording, however, is spent in physiologic drowsiness and sleep.  ACTIVATION:  Photic stimulation and hyperventilation are not performed.  EPILEPTIFORM ACTIVITY:  There were no spikes, sharp waves or paroxysmal activity.  SLEEP:  As above, most of the recording is spent in physiologic drowsiness and in the sleep state.  CARDIAC:  The EKG lead was not recorded.  IMPRESSION:  This is a normal EEG for the patients stated age, although most of the recording was spent in the physiologic drowsiness and asleep state.  There was very little recording spent in the wake state.  There were no focal, hemispheric or lateralizing features.  No epileptiform activity was recorded.  A normal EEG does not exclude the diagnosis of a seizure disorder and if seizure remains high amount of the list of differential diagnoses, an ambulatory EEG may be of value.

## 2015-08-28 NOTE — Progress Notes (Signed)
[facility] PROGRESS NOTE  MASSEY RUHLAND ZOX:[id number] DOB: August 16, 1950 DOA: 08/26/2015 PCP: [facility]  Assessment/Plan: 1. Rhabdomyolysis- patient had mild CPK elevation of 1217, started on IV fluids. Will check CPK today 2. Seizure disorder- patient was taking Dilantin 300 mg by mouth daily at bedtime. Dilantin level in the hospital was less than 2.5. I called and discussed with neurologist on call, he saw the patient and changed Dilantin to phenobarbital 64.8 mg by mouth daily at bedtime 3. Alcohol abuse- no signs and symptoms of alcohol withdrawal, continue CIWA protocol. 4. Hypertension- patient has uncontrolled hypertension, we'll start metoprolol 25 milli grams by mouth twice a day 5. Elevated lactic acid-resolved, likely from dehydration. This morning repeat lactic acid is 1.1 6. DVT prophylaxis- Lovenox    Code Status: Full code Family Communication: *No family present at bedside Disposition Plan: Skilled nursing facility when bed available   Consultants:  Neurology  Procedures:  None  Antibiotics:  None  HPI/Subjective: 65 yo M with history of homelessness, seizure disorder, and ETOH abuse who presents after being found by bystanders sleeping (or passed out) on the ground in 28-degree F weather. He had drunk a pint of liquor the night before. He denied any recent symptoms of infection. His VS were notable for an initial temperature of 91.4 and initially he was in respiratory distress which was relieved with albuterol. Bair hugger placed for four hours in the ER and his temperature normalized. Labs were notable for WBC 4.6, neg CXR and UA. Alcohol level 146. Lactic acid has risen a somewhat but he had only received one liter of IVF.  Patient feels better this morning. Denies chest pain or shortness of breath.  Objective: Filed Vitals:   08/28/15 0532 08/28/15 0649  BP: 166/115 158/98  Pulse: 93 88  Temp: 97.9 F (36.6 C)   Resp: 16 16     Intake/Output Summary (Last 24 hours) at 08/28/15 1411 Last data filed at 08/28/15 0642  Gross per 24 hour  Intake 3038.66 ml  Output   3050 ml  Net -11.34 ml   Filed Weights   08/26/15 0850 08/26/15 1550  Weight: 72.122 kg (159 lb) 73.074 kg (161 lb 1.6 oz)    Exam:   General:  *Appears in no acute distress  Cardiovascular: S1-S2 regular  Respiratory: Clear to auscultation bilaterally  Abdomen: Soft, nontender, no organomegaly  Musculoskeletal: No cyanosis/clubbing/edema of the lower extremities   Data Reviewed: Basic Metabolic Panel:  Recent Labs Lab 08/26/15 0913 08/27/15 0622  NA 145 142  K 4.4 4.1  CL 110 111  CO2 20* 25  GLUCOSE 87 98  BUN 16 9  CREATININE 0.88 1.06  CALCIUM 8.6* 8.2*   CBC:  Recent Labs Lab 08/26/15 0913 08/27/15 0622  WBC 4.6 6.1  NEUTROABS 2.2  --   HGB 12.8* 11.6*  HCT 39.8 36.0*  MCV 93.2 93.3  PLT 184 180   Cardiac Enzymes:  Recent Labs Lab 08/26/15 0913  CKTOTAL 1217*     Studies: No results found.  Scheduled Meds: . aspirin EC  81 mg Oral Daily  . enoxaparin (LOVENOX) injection  40 mg Subcutaneous Q24H  . folic acid  1 mg Oral Daily  . metoprolol tartrate  25 mg Oral BID  . multivitamin with minerals  1 tablet Oral Daily  . phenobarbital  64.8 mg Oral QHS  . thiamine  100 mg Oral Daily   Continuous Infusions: . sodium chloride 100 mL/hr at 08/27/15 2217  Principal Problem:   Hypothermia Active Problems:   Alcohol abuse   Tobacco abuse   Homelessness   S/P ventriculoperitoneal shunt   Seizure disorder (HCC)   Rhabdomyolysis    Time spent: 25 min    [facility]  [facility] Pager [phone]*. If 7PM-7AM, please contact night-coverage at [email or web address], password [facility] 08/28/2015, 2:11 PM  LOS: 1 day

## 2015-08-29 ENCOUNTER — Encounter (HOSPITAL_COMMUNITY): Payer: Self-pay | Admitting: General Practice

## 2015-08-29 MED ORDER — INFLUENZA VAC SPLIT QUAD 0.5 ML IM SUSY: 0.5000 mL | PREFILLED_SYRINGE | INTRAMUSCULAR | Status: DC

## 2015-08-29 MED ORDER — FOLIC ACID 1 MG PO TABS: 1.0000 mg | ORAL_TABLET | Freq: Every day | ORAL | Status: DC

## 2015-08-29 MED ORDER — METOPROLOL TARTRATE 25 MG PO TABS: 25.0000 mg | ORAL_TABLET | Freq: Two times a day (BID) | ORAL | Status: DC

## 2015-08-29 MED ORDER — PHENOBARBITAL 64.8 MG PO TABS: 64.8000 mg | ORAL_TABLET | Freq: Every day | ORAL | Status: DC

## 2015-08-29 MED ORDER — THIAMINE HCL 100 MG PO TABS: 100.0000 mg | ORAL_TABLET | Freq: Every day | ORAL | Status: AC

## 2015-08-29 NOTE — Discharge Summary (Addendum)
Physician Discharge Summary  Miguel [name] UJW:[id number] DOB: 04/05/1951 DOA: 08/26/2015  PCP: [facility]  Admit date: 08/26/2015 Discharge date: 08/29/2015  Time spent: *25 minutes  Recommendations for Outpatient Follow-up:  1. Follow up PCP in 2 weeks 2. Start taking Phenobarbital as prescribed, stop Dilantin   Discharge Diagnoses:  Principal Problem:   Hypothermia Active Problems:   Alcohol abuse   Tobacco abuse   Homelessness   S/P ventriculoperitoneal shunt   Seizure disorder [facility])   Rhabdomyolysis   Discharge Condition: Stable  Diet recommendation: low salt diet  Filed Weights   08/26/15 0850 08/26/15 1550  Weight: 72.122 kg (159 lb) 73.074 kg (161 lb 1.6 oz)    History of present illness:  65 yo M with history of homelessness, seizure disorder, and ETOH abuse who presents after being found by bystanders sleeping (or passed out) on the ground in 28-degree F weather. He had drunk a pint of liquor the night before. He denied any recent symptoms of infection. His VS were notable for an initial temperature of 91.4 and initially he was in respiratory distress which was relieved with albuterol. Bair hugger placed for four hours in the ER and his temperature normalized. Labs were notable for WBC 4.6, neg CXR and UA. Alcohol level 146. Lactic acid has risen a somewhat but he had only received one liter of IVF.  Hospital Course:  1. Rhabdomyolysis- patient had mild CPK elevation of 1217, started on IV fluids. Repeat CPK is 313. 2. Seizure disorder- patient was taking Dilantin 300 mg by mouth daily at bedtime. Dilantin level in the hospital was less than 2.5. I called and discussed with neurologist on call, he saw the patient and changed Dilantin to phenobarbital 64.8 mg by mouth daily at bedtime. We'll discharge the patient home on phenobarbital 64.8 mg by mouth daily at bedtime. 3. Hyperthermia- patient presented with hypothermia on admission, required bair hugger.  Hyperthermia is resolved at this time. 4. Alcohol abuse- no signs and symptoms of alcohol withdrawal, continue thiamine and folate. 5. Hypertension- patient has uncontrolled hypertension, we'll start metoprolol 25 milli grams by mouth twice a day 6. Elevated lactic acid-resolved, likely from dehydration. This morning repeat lactic acid is 1.1  Patient was offered to go to skilled facility. But at this time wants to go home with daughter. Will send the patient home with home health PT OT. 5 inch we'll rolling walker will be provided.   Procedures:  None  Consultations:  None  Discharge Exam: Filed Vitals:   08/28/15 2111 08/29/15 0609  BP: 141/75 157/84  Pulse: 73 66  Temp: 98.4 F (36.9 C) 97.7 F (36.5 C)  Resp: 16 16    General: *Appears in no acute distress Cardiovascular: S1-S2 regular Respiratory: Clear to auscultation bilaterally  Discharge Instructions   Discharge Instructions    Diet - low sodium heart healthy    Complete by:  As directed      Increase activity slowly    Complete by:  As directed           Current Discharge Medication List    START taking these medications   Details  folic acid (FOLVITE) 1 MG tablet Take 1 tablet (1 mg total) by mouth daily. Qty: 30 tablet, Refills: 0    metoprolol tartrate (LOPRESSOR) 25 MG tablet Take 1 tablet (25 mg total) by mouth 2 (two) times daily. Qty: 60 tablet, Refills: 2    PHENobarbital (LUMINAL) 64.8 MG tablet Take 1 tablet (64.8  mg total) by mouth at bedtime. Qty: 30 tablet, Refills: 5    thiamine 100 MG tablet Take 1 tablet (100 mg total) by mouth daily. Qty: 30 tablet, Refills: 2      CONTINUE these medications which have NOT CHANGED   Details  aspirin EC 81 MG tablet Take 81 mg by mouth daily.      STOP taking these medications     phenytoin (DILANTIN) 300 MG ER capsule        No Known Allergies    The results of significant diagnostics from this hospitalization (including imaging,  microbiology, ancillary and laboratory) are listed below for reference.    Significant Diagnostic Studies: Dg Chest 2 View  08/26/2015  CLINICAL DATA:  Shortness of breath.  Cold exposure, wheezing. EXAM: CHEST  2 VIEW COMPARISON:  04/04/2015 FINDINGS: Heart and mediastinal contours are within normal limits. No focal opacities or effusions. No acute bony abnormality. IMPRESSION: No active cardiopulmonary disease. Electronically Signed   By: Charlett Nose M.D.   On: 08/26/2015 09:50    Microbiology: No results found for this or any previous visit (from the past 240 hour(s)).   Labs: Basic Metabolic Panel:  Recent Labs Lab 08/26/15 0913 08/27/15 0622  NA 145 142  K 4.4 4.1  CL 110 111  CO2 20* 25  GLUCOSE 87 98  BUN 16 9  CREATININE 0.88 1.06  CALCIUM 8.6* 8.2*   Liver Function Tests: No results for input(s): AST, ALT, ALKPHOS, BILITOT, PROT, ALBUMIN in the last 168 hours. No results for input(s): LIPASE, AMYLASE in the last 168 hours. No results for input(s): AMMONIA in the last 168 hours. CBC:  Recent Labs Lab 08/26/15 0913 08/27/15 0622  WBC 4.6 6.1  NEUTROABS 2.2  --   HGB 12.8* 11.6*  HCT 39.8 36.0*  MCV 93.2 93.3  PLT 184 180   Cardiac Enzymes:  Recent Labs Lab 08/26/15 0913 08/28/15 1900  CKTOTAL 1217* 313     Signed:  Mauro Kaufmann S MD.  [facility] 08/29/2015, 11:04 AM

## 2015-08-29 NOTE — Progress Notes (Signed)
Patient has refused SNF placement on today.Patient reports he will be discharging back to his girlfriend's home. Address: [address], [place] [address]. Patient has been provided a taxi voucher for transport. No further CSW needs were requested at this time. CSW to sign off.   Fernande Boyden, LCSWA Clinical Social Worker [facility] Ph: [phone]

## 2015-08-29 NOTE — Care Management Note (Signed)
Case Management Note  Patient Details  Name: Miguel [name] MRN: [id number] Date of Birth: [birth year]  Subjective/Objective:                    Action/Plan:  Patient states he is going to stay with his girl friend at discharge at [address], [place] [address] , phone [phone]  Expected Discharge Date:                  Expected Discharge Plan:  Home w Home Health Services  In-House Referral:     Discharge planning Services  CM Consult  Post Acute Care Choice:  Home Health, Durable Medical Equipment Choice offered to:  Patient  DME Arranged:  Walker rolling DME Agency:  [facility]  HH Arranged:  PT [facility] Agency:  [facility]  Status of Service:  Completed, signed off  Medicare Important Message Given:    Date Medicare IM Given:    Medicare IM give by:    Date Additional Medicare IM Given:    Additional Medicare Important Message give by:     If discussed at Long Length of Stay Meetings, dates discussed:    Additional Comments:  Kingsley Plan, RN 08/29/2015, 11:22 AM

## 2015-08-29 NOTE — Progress Notes (Signed)
Miguel [name] to be D/C'd  per MD order. Discussed with the patient and all questions fully answered.  VSS, Skin clean, dry and intact without evidence of skin break down, no evidence of skin tears noted.  IV catheter discontinued intact. Site without signs and symptoms of complications. Dressing and pressure applied.  An After Visit Summary was printed and given to the patient. Patient received prescription.  D/c education completed with patient/family including follow up instructions, medication list, d/c activities limitations if indicated, with other d/c instructions as indicated by MD - patient able to verbalize understanding, all questions fully answered.   Patient instructed to return to ED, call 911, or call MD for any changes in condition.   Patient to be escorted via WC, and D/C home via taxis.

## 2015-09-24 IMAGING — CR DG CHEST 2V
2 series · 2 of 2 positions shown · non-contrast
Comparison: 07/12/2013

CLINICAL DATA: Altered mental status. Cough, seizures. Acute renal
failure.

EXAM:
CHEST  2 VIEW

[w chest pa]
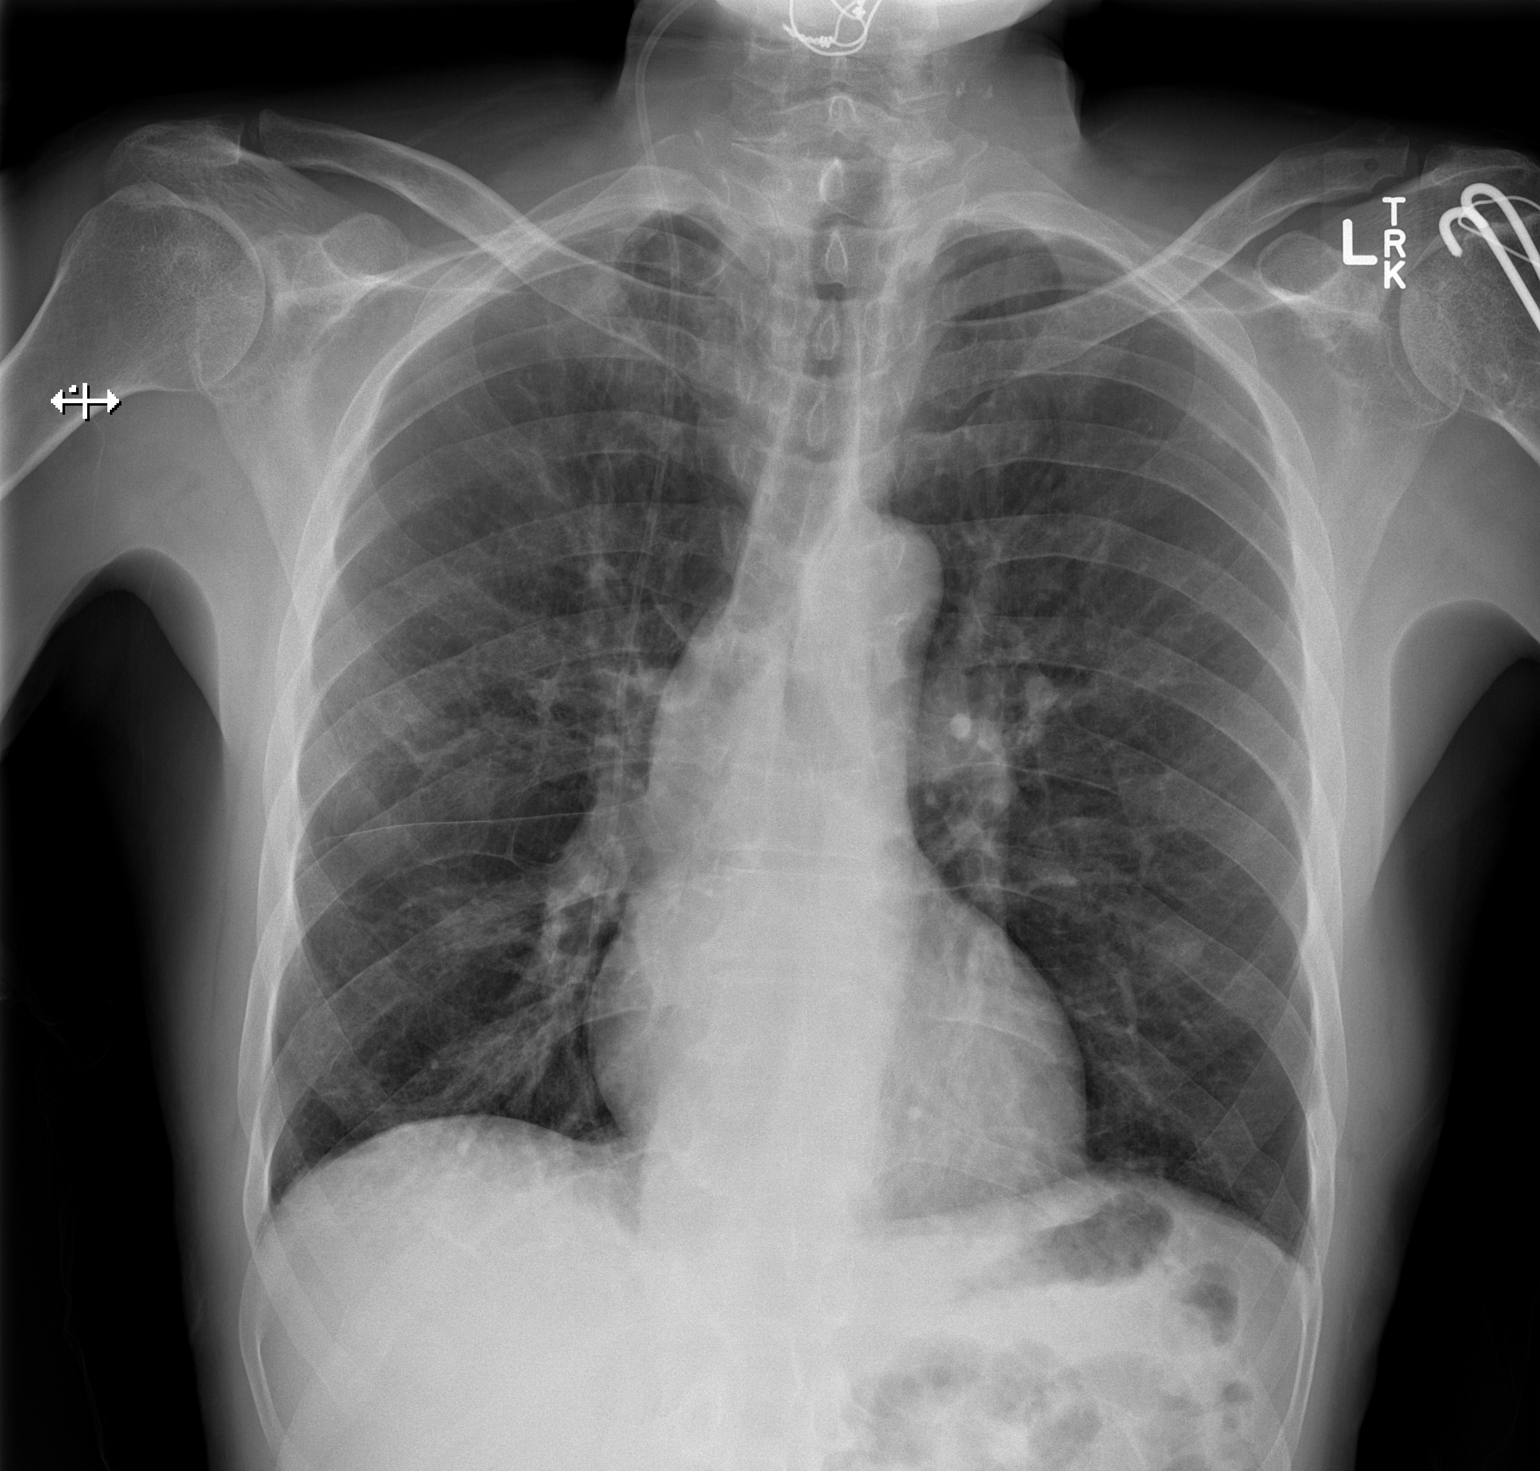

[w chest lat]
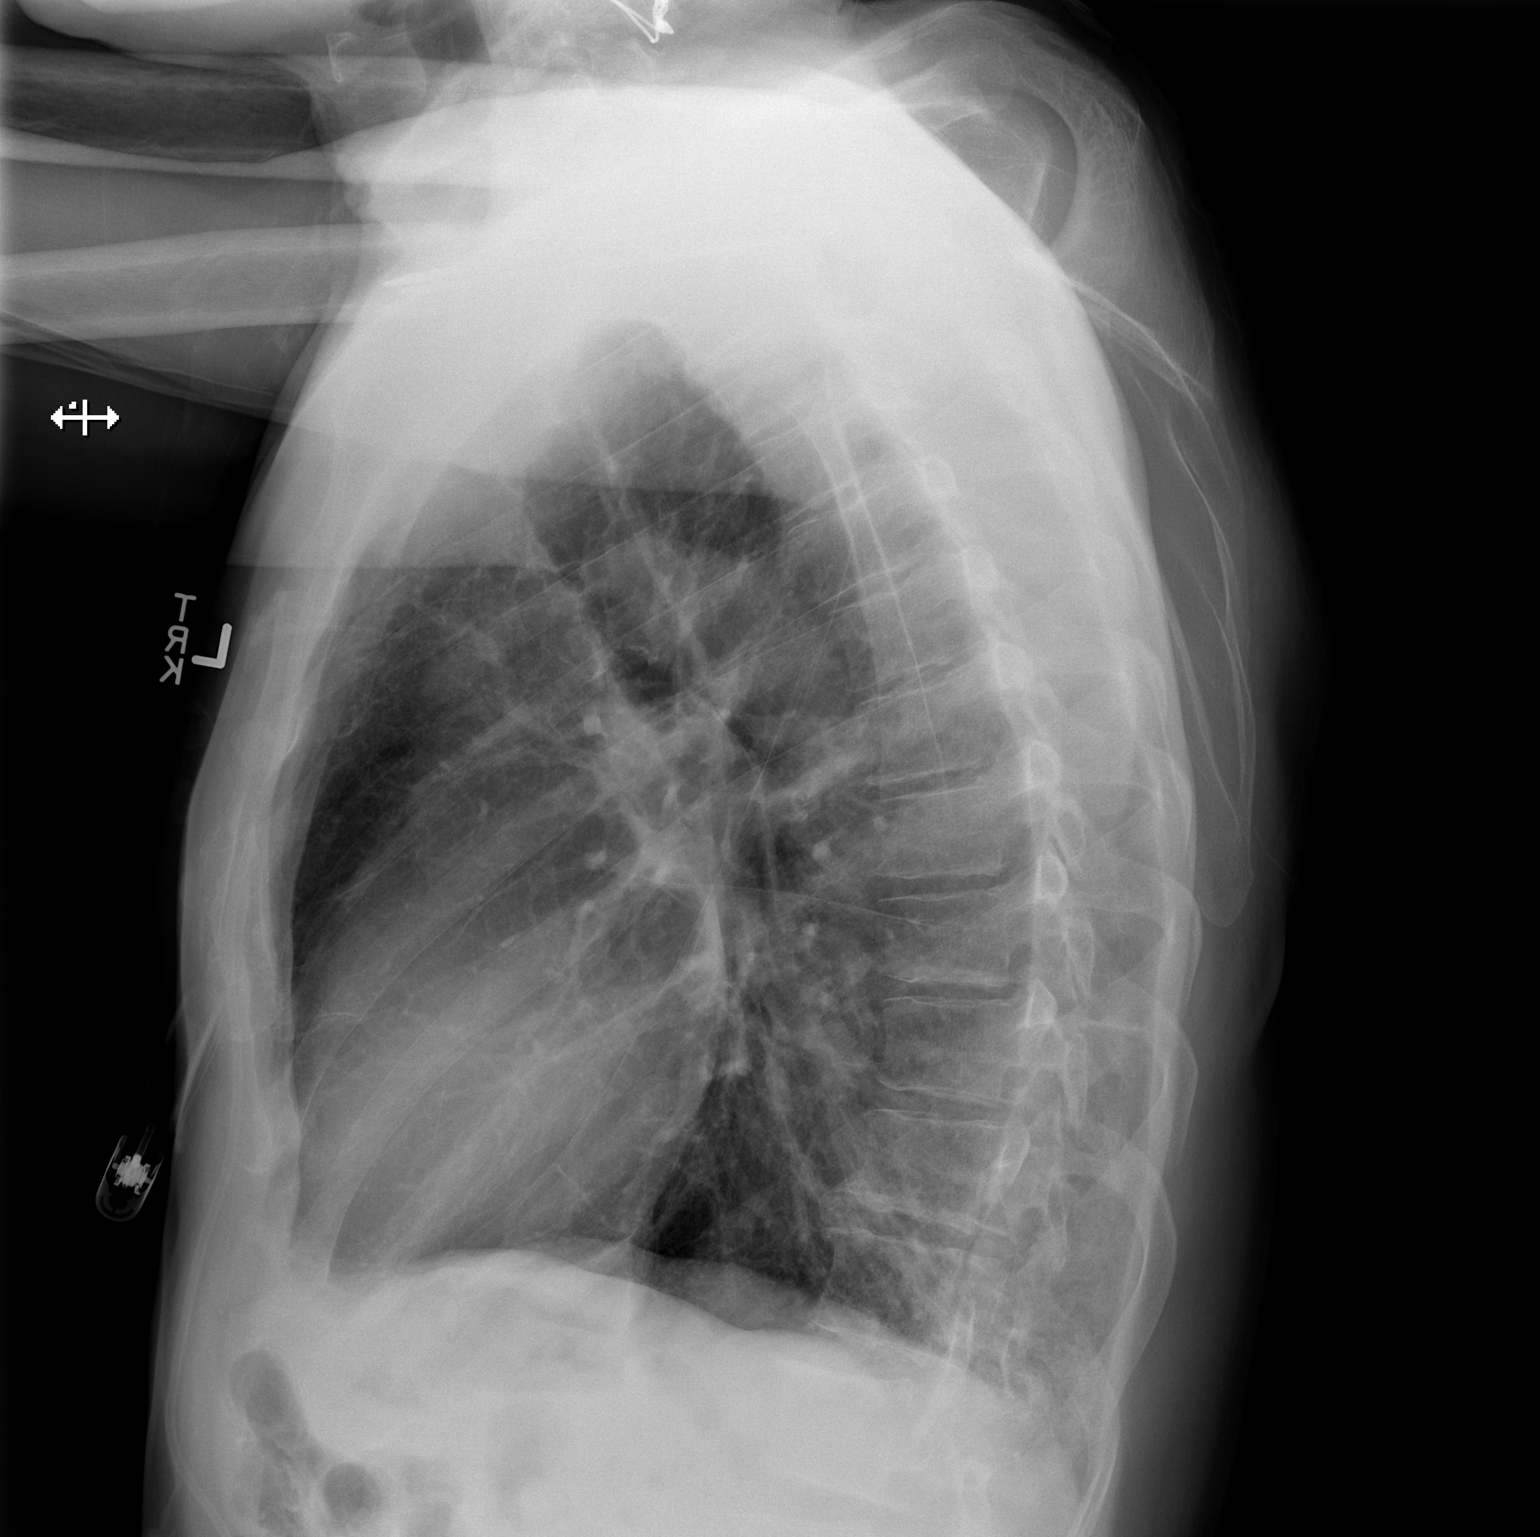

[2 of 2 positions shown; findings below may reference images not displayed]

FINDINGS: There is hyperinflation of the lungs compatible with COPD. Heart and
mediastinal contours are within normal limits. No focal opacities or
effusions. No acute bony abnormality. Ventriculoperitoneal shunt
catheter projects over the right chest.
IMPRESSION: Hyperinflation/COPD.  No active disease.

## 2015-09-24 IMAGING — CR DG PELVIS 1-2V
1 series · 1 of 1 positions shown · non-contrast
Comparison: Abdominal radiograph from 02/24/2013

CLINICAL DATA: Left hip pain while walking.  Initial encounter.

EXAM:
PELVIS - 1-2 VIEW

[pelvis ap]
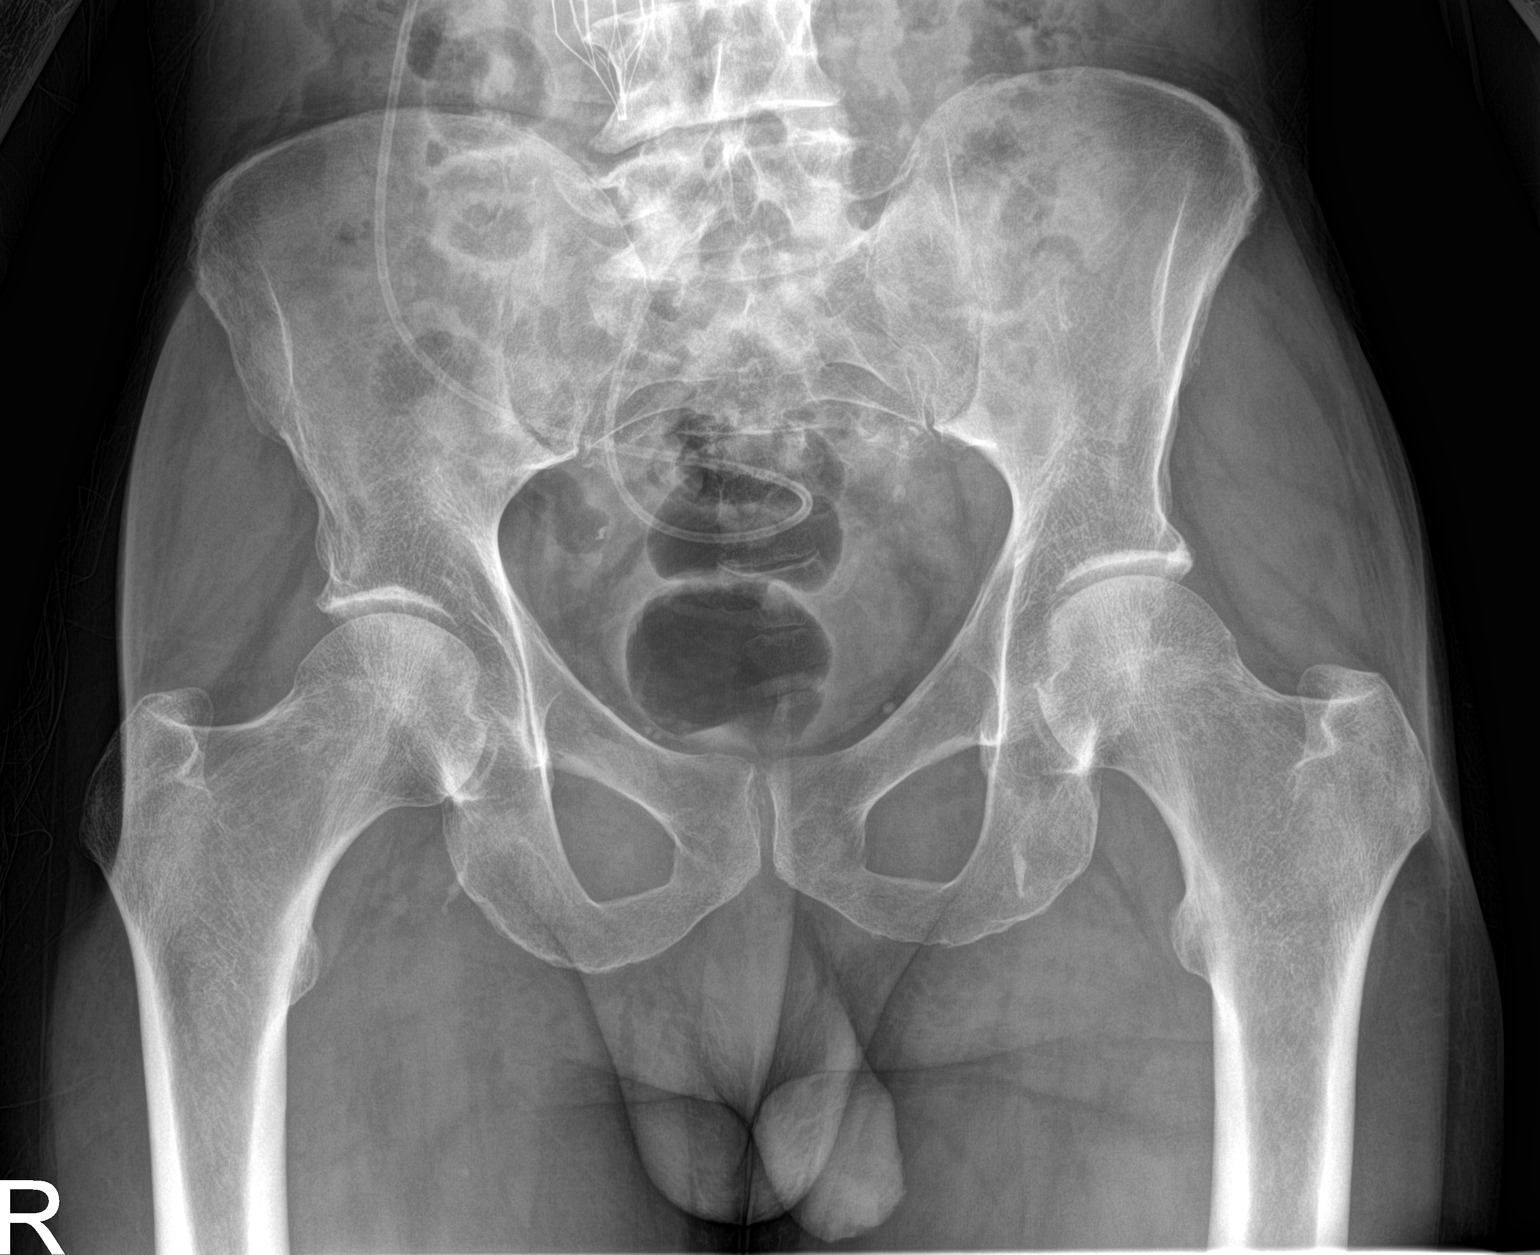

[1 of 1 positions shown; findings below may reference images not displayed]

FINDINGS: There is no evidence of fracture or dislocation. Both femoral heads
are seated normally within their respective acetabula. No
significant degenerative change is appreciated. The sacroiliac
joints are unremarkable in appearance.

The visualized bowel gas pattern is grossly unremarkable in
appearance. A shunt is noted overlying the right hemipelvis. An IVC
filter is noted.
IMPRESSION: No evidence of fracture or dislocation.

## 2015-09-24 IMAGING — CT CT HEAD W/O CM
1 series · 15 of 29 positions shown, 19 images · non-contrast
Comparison: Prior head CT 07/16/2014

CLINICAL DATA: 63-year-old male with altered mental status,
possible fever

EXAM:
CT HEAD WITHOUT CONTRAST
TECHNIQUE: Contiguous axial images were obtained from the base of the skull
through the vertex without intravenous contrast.

[Series 2: head 5.0 h30s · axial · 0.48mm/px · z∈[-106,+24]mm · 15 of 29 slices shown, 19 images]
[im 2/29  brain]
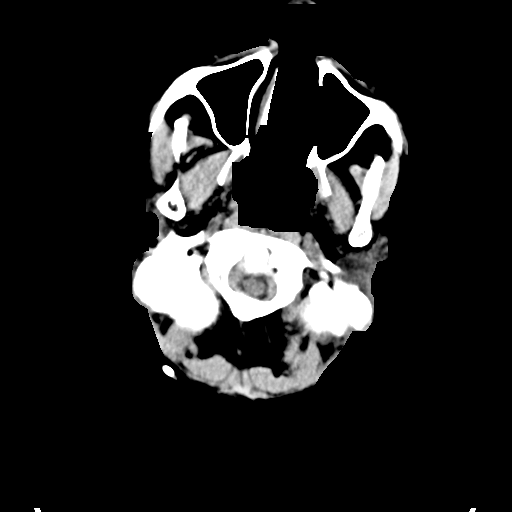
[im 2/29  bone]
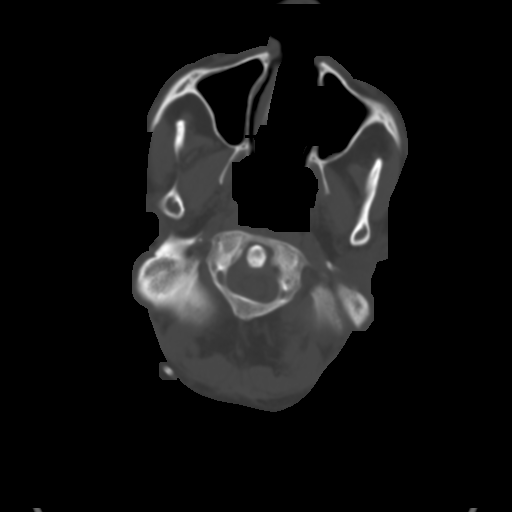
[im 4/29  brain]
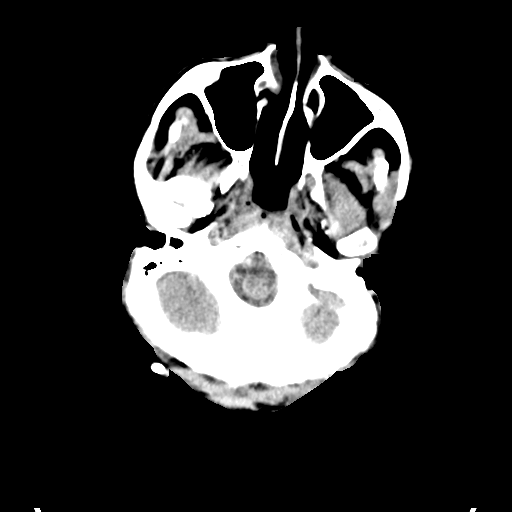
[im 6/29  brain]
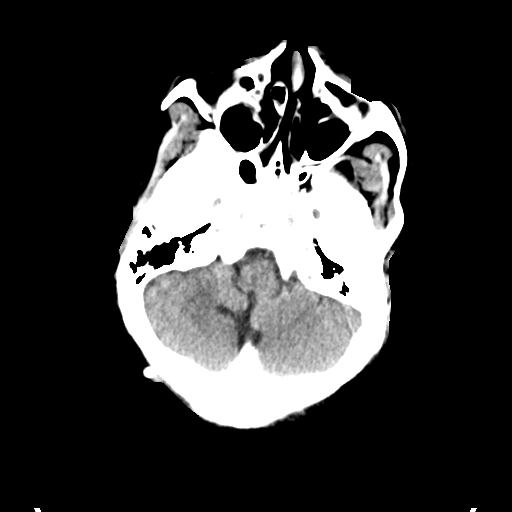
[im 8/29  brain]
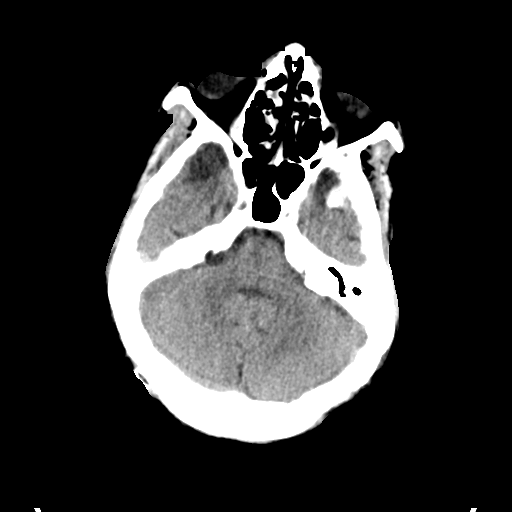
[im 10/29  brain]
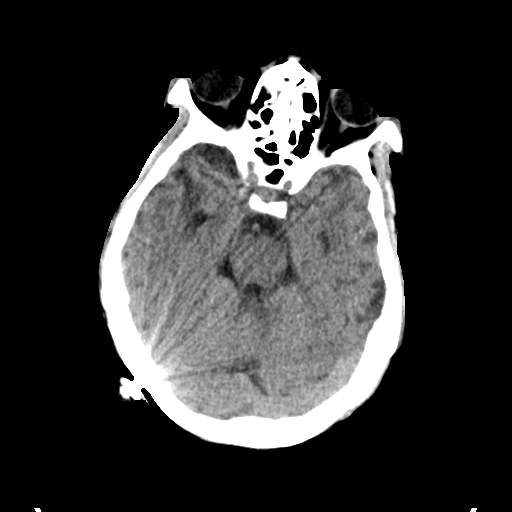
[im 10/29  bone]
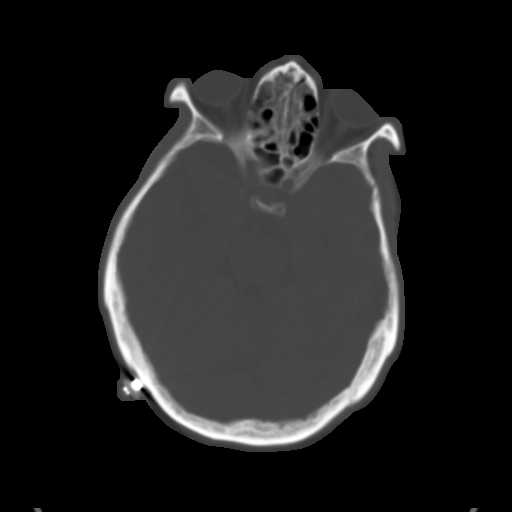
[im 11/29  brain]
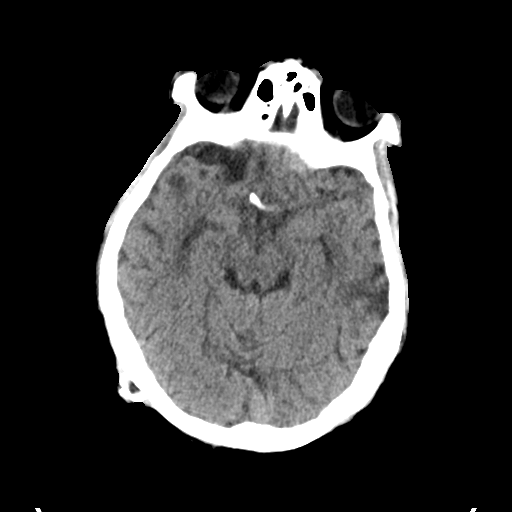
[im 13/29  brain]
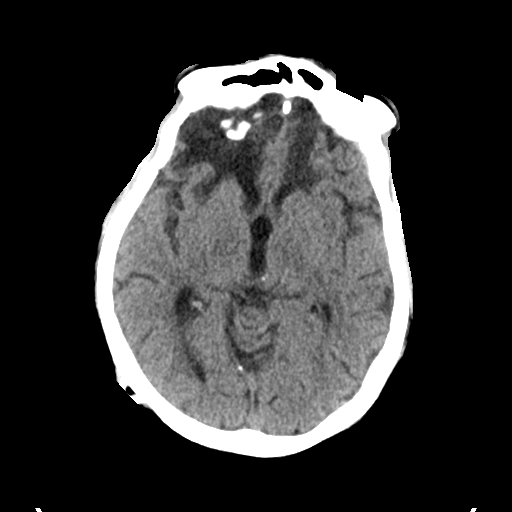
[im 15/29  brain]
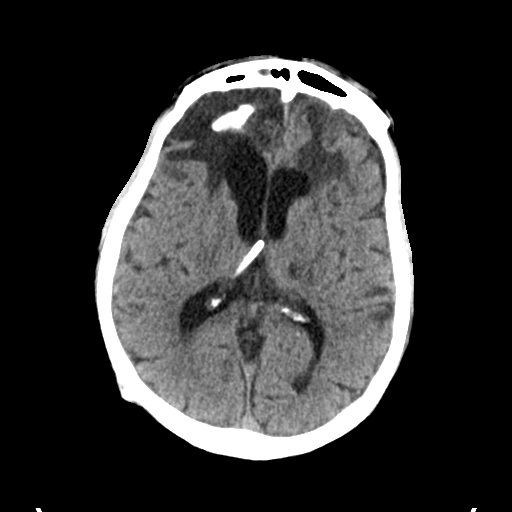
[im 17/29  brain]
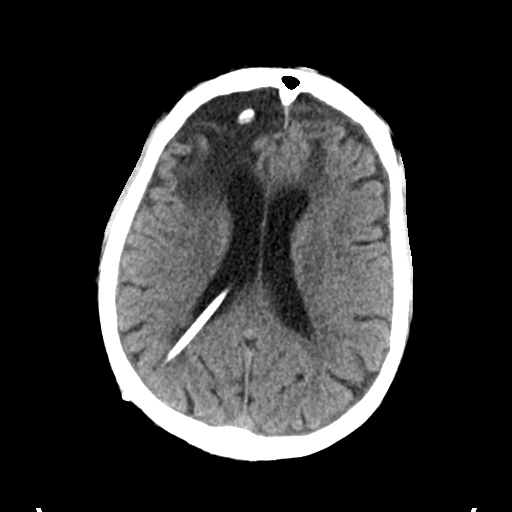
[im 17/29  bone]
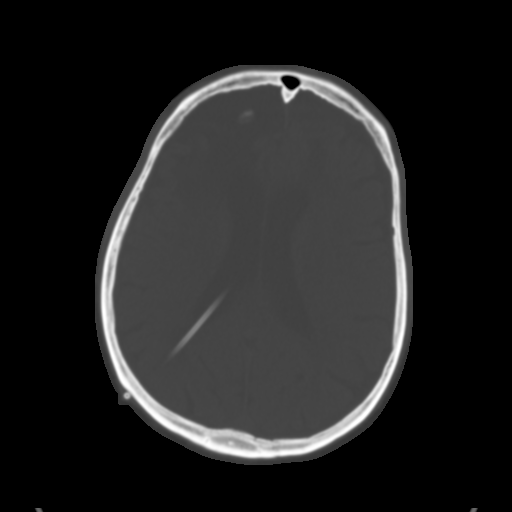
[im 19/29  brain]
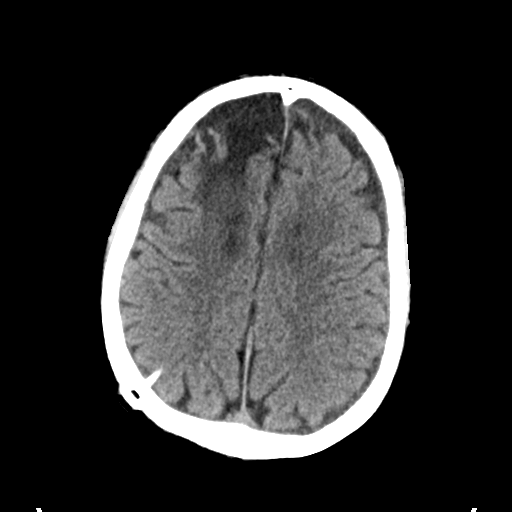
[im 20/29  brain]
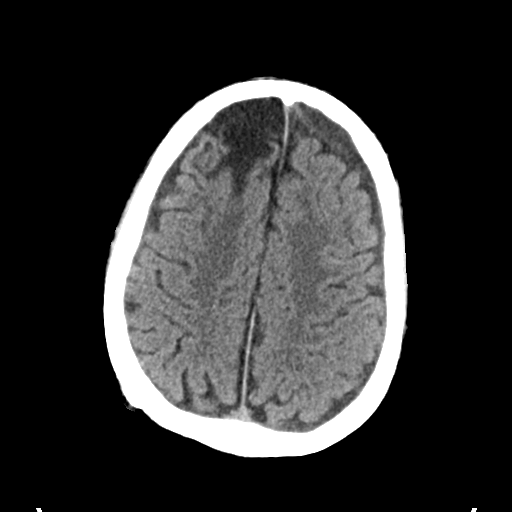
[im 22/29  brain]
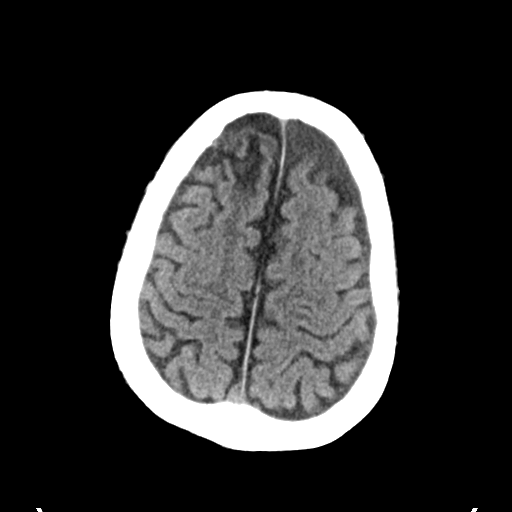
[im 24/29  brain]
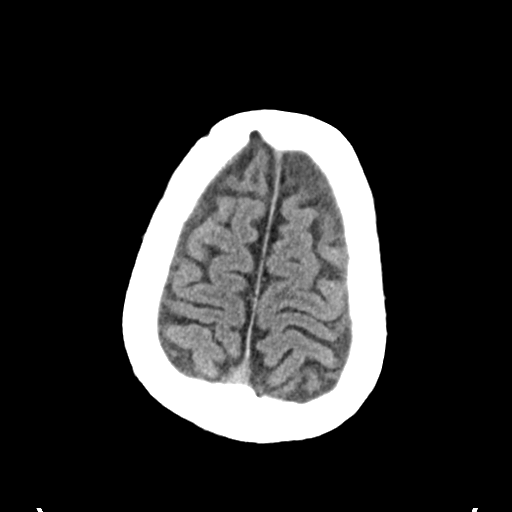
[im 24/29  bone]
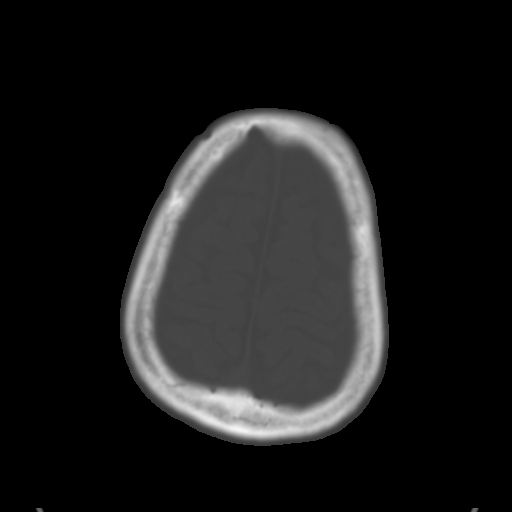
[im 26/29  brain]
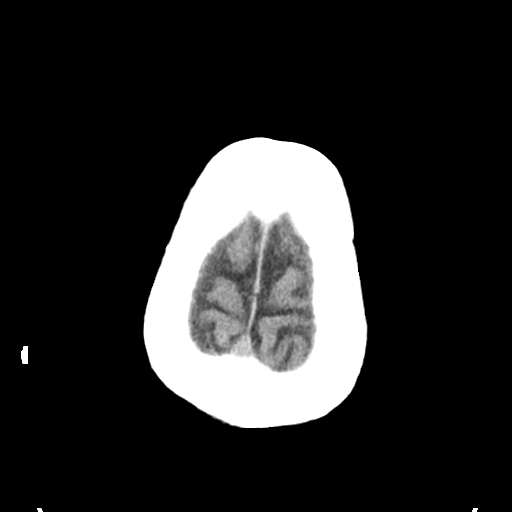
[im 28/29  brain]
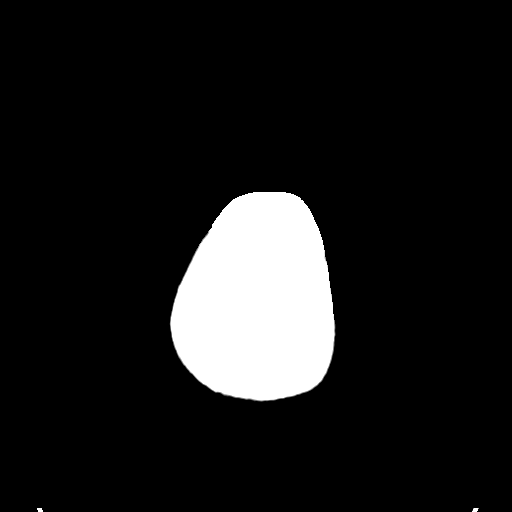

[15 of 29 positions shown; findings below may reference images not displayed]

FINDINGS: Negative for acute intracranial hemorrhage, acute infarction, mass,
mass effect, hydrocephalus or midline shift. Gray-white
differentiation is preserved throughout. Stable appearance of the
brain with focal encephalomalacia in the bilateral frontal and
inferior anterior temporal lobes. Dystrophic calcifications are
present in the encephalomalacia kicked right frontal lobe. Right
parietal approach ventriculoperitoneal shunt catheter. The tip is
again noted in the region of the foramen of Auad. Stable
ventricular configuration.

No focal soft tissue or calvarial abnormality. Partial opacification
of the ethmoid air cells.
IMPRESSION: 1. No acute intracranial abnormality.
2. Unchanged appearance of the brain with extensive bilateral
frontal and temporal encephalomalacia. Query past history of
traumatic brain injury?
3. Right parietal approach ventriculoperitoneal shunt catheter
remains in unchanged position. Stable configuration of the
ventricles.
4. Inflammatory paranasal sinus disease with partial opacification
of scattered ethmoid air cells.

## 2015-09-28 ENCOUNTER — Encounter (HOSPITAL_COMMUNITY): Payer: Self-pay

## 2015-09-28 ENCOUNTER — Inpatient Hospital Stay (HOSPITAL_COMMUNITY)
Admission: EM | Admit: 2015-09-28 | Discharge: 2015-10-03 | DRG: 603 | Disposition: A | Discharge status: Skilled Nursing Facility | Payer: Medicare Other | Attending: Internal Medicine | Admitting: Internal Medicine

## 2015-09-28 DIAGNOSIS — F17210 Nicotine dependence, cigarettes, uncomplicated: Secondary | ICD-10-CM | POA: Diagnosis present

## 2015-09-28 DIAGNOSIS — Z86718 Personal history of other venous thrombosis and embolism: Secondary | ICD-10-CM

## 2015-09-28 DIAGNOSIS — Z79899 Other long term (current) drug therapy: Secondary | ICD-10-CM

## 2015-09-28 DIAGNOSIS — R609 Edema, unspecified: Secondary | ICD-10-CM

## 2015-09-28 DIAGNOSIS — Z982 Presence of cerebrospinal fluid drainage device: Secondary | ICD-10-CM

## 2015-09-28 DIAGNOSIS — R1031 Right lower quadrant pain: Secondary | ICD-10-CM

## 2015-09-28 DIAGNOSIS — D649 Anemia, unspecified: Secondary | ICD-10-CM | POA: Diagnosis present

## 2015-09-28 DIAGNOSIS — M79606 Pain in leg, unspecified: Secondary | ICD-10-CM | POA: Diagnosis present

## 2015-09-28 DIAGNOSIS — Y900 Blood alcohol level of less than 20 mg/100 ml: Secondary | ICD-10-CM | POA: Diagnosis present

## 2015-09-28 DIAGNOSIS — R569 Unspecified convulsions: Secondary | ICD-10-CM

## 2015-09-28 DIAGNOSIS — L0390 Cellulitis, unspecified: Secondary | ICD-10-CM | POA: Diagnosis present

## 2015-09-28 DIAGNOSIS — Z9119 Patient's noncompliance with other medical treatment and regimen: Secondary | ICD-10-CM

## 2015-09-28 DIAGNOSIS — L03115 Cellulitis of right lower limb: Principal | ICD-10-CM

## 2015-09-28 DIAGNOSIS — M79604 Pain in right leg: Secondary | ICD-10-CM | POA: Diagnosis not present

## 2015-09-28 DIAGNOSIS — B964 Proteus (mirabilis) (morganii) as the cause of diseases classified elsewhere: Secondary | ICD-10-CM | POA: Diagnosis present

## 2015-09-28 DIAGNOSIS — F1020 Alcohol dependence, uncomplicated: Secondary | ICD-10-CM | POA: Diagnosis present

## 2015-09-28 DIAGNOSIS — M6282 Rhabdomyolysis: Secondary | ICD-10-CM | POA: Diagnosis present

## 2015-09-28 DIAGNOSIS — I4891 Unspecified atrial fibrillation: Secondary | ICD-10-CM | POA: Diagnosis present

## 2015-09-28 DIAGNOSIS — Z9114 Patient's other noncompliance with medication regimen: Secondary | ICD-10-CM

## 2015-09-28 DIAGNOSIS — F1010 Alcohol abuse, uncomplicated: Secondary | ICD-10-CM | POA: Diagnosis present

## 2015-09-28 DIAGNOSIS — Z8673 Personal history of transient ischemic attack (TIA), and cerebral infarction without residual deficits: Secondary | ICD-10-CM

## 2015-09-28 DIAGNOSIS — I10 Essential (primary) hypertension: Secondary | ICD-10-CM | POA: Diagnosis present

## 2015-09-28 DIAGNOSIS — R102 Pelvic and perineal pain: Secondary | ICD-10-CM

## 2015-09-28 DIAGNOSIS — E860 Dehydration: Secondary | ICD-10-CM

## 2015-09-28 DIAGNOSIS — W19XXXA Unspecified fall, initial encounter: Secondary | ICD-10-CM

## 2015-09-28 DIAGNOSIS — Z823 Family history of stroke: Secondary | ICD-10-CM
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
## 2015-09-28 NOTE — ED Notes (Signed)
Patient c/o leg pain that started when he woke up this morning. Patient is able to move foot states that leg "is just sore".  Denies falling or trauma, says pain is new and not had pain like this before.  Patient rates pain 8/10.

## 2015-09-29 ENCOUNTER — Inpatient Hospital Stay (HOSPITAL_COMMUNITY): Payer: Medicare Other

## 2015-09-29 ENCOUNTER — Ambulatory Visit (HOSPITAL_COMMUNITY): Payer: Medicare Other

## 2015-09-29 ENCOUNTER — Inpatient Hospital Stay (HOSPITAL_COMMUNITY)
Admit: 2015-09-29 | Discharge: 2015-09-29 | Disposition: A | Discharge status: Home / Self Care | Payer: Medicare Other | Attending: Internal Medicine | Admitting: Internal Medicine

## 2015-09-29 ENCOUNTER — Encounter (HOSPITAL_COMMUNITY): Payer: Self-pay | Admitting: Radiology

## 2015-09-29 DIAGNOSIS — D649 Anemia, unspecified: Secondary | ICD-10-CM | POA: Diagnosis present

## 2015-09-29 DIAGNOSIS — M6282 Rhabdomyolysis: Secondary | ICD-10-CM | POA: Diagnosis not present

## 2015-09-29 DIAGNOSIS — Z79899 Other long term (current) drug therapy: Secondary | ICD-10-CM | POA: Diagnosis not present

## 2015-09-29 DIAGNOSIS — Z8673 Personal history of transient ischemic attack (TIA), and cerebral infarction without residual deficits: Secondary | ICD-10-CM | POA: Diagnosis not present

## 2015-09-29 DIAGNOSIS — E860 Dehydration: Secondary | ICD-10-CM | POA: Diagnosis present

## 2015-09-29 DIAGNOSIS — M79609 Pain in unspecified limb: Secondary | ICD-10-CM | POA: Diagnosis not present

## 2015-09-29 DIAGNOSIS — Y900 Blood alcohol level of less than 20 mg/100 ml: Secondary | ICD-10-CM | POA: Diagnosis present

## 2015-09-29 DIAGNOSIS — M7989 Other specified soft tissue disorders: Secondary | ICD-10-CM

## 2015-09-29 DIAGNOSIS — L03115 Cellulitis of right lower limb: Principal | ICD-10-CM

## 2015-09-29 DIAGNOSIS — M79606 Pain in leg, unspecified: Secondary | ICD-10-CM

## 2015-09-29 DIAGNOSIS — M79604 Pain in right leg: Secondary | ICD-10-CM

## 2015-09-29 DIAGNOSIS — F1010 Alcohol abuse, uncomplicated: Secondary | ICD-10-CM

## 2015-09-29 DIAGNOSIS — Z9114 Patient's other noncompliance with medication regimen: Secondary | ICD-10-CM | POA: Diagnosis not present

## 2015-09-29 DIAGNOSIS — Z86718 Personal history of other venous thrombosis and embolism: Secondary | ICD-10-CM | POA: Diagnosis not present

## 2015-09-29 DIAGNOSIS — L0390 Cellulitis, unspecified: Secondary | ICD-10-CM | POA: Diagnosis present

## 2015-09-29 DIAGNOSIS — I10 Essential (primary) hypertension: Secondary | ICD-10-CM | POA: Diagnosis present

## 2015-09-29 DIAGNOSIS — F1020 Alcohol dependence, uncomplicated: Secondary | ICD-10-CM | POA: Diagnosis present

## 2015-09-29 DIAGNOSIS — R569 Unspecified convulsions: Secondary | ICD-10-CM | POA: Diagnosis present

## 2015-09-29 DIAGNOSIS — B964 Proteus (mirabilis) (morganii) as the cause of diseases classified elsewhere: Secondary | ICD-10-CM | POA: Diagnosis present

## 2015-09-29 DIAGNOSIS — I4891 Unspecified atrial fibrillation: Secondary | ICD-10-CM | POA: Diagnosis present

## 2015-09-29 DIAGNOSIS — Z982 Presence of cerebrospinal fluid drainage device: Secondary | ICD-10-CM | POA: Diagnosis not present

## 2015-09-29 DIAGNOSIS — Z9119 Patient's noncompliance with other medical treatment and regimen: Secondary | ICD-10-CM | POA: Diagnosis not present

## 2015-09-29 DIAGNOSIS — F17210 Nicotine dependence, cigarettes, uncomplicated: Secondary | ICD-10-CM | POA: Diagnosis present

## 2015-09-29 DIAGNOSIS — Z823 Family history of stroke: Secondary | ICD-10-CM | POA: Diagnosis not present

## 2015-09-29 LAB — RAPID URINE DRUG SCREEN, HOSP PERFORMED
AMPHETAMINES: NOT DETECTED
BENZODIAZEPINES: NOT DETECTED
Barbiturates: POSITIVE — AB
COCAINE: NOT DETECTED
Opiates: NOT DETECTED
Tetrahydrocannabinol: NOT DETECTED

## 2015-09-29 LAB — ETHANOL

## 2015-09-29 LAB — TROPONIN I
Troponin I: <0.03 ng/mL (ref <0.031)
Troponin I: <0.03 ng/mL (ref <0.031)

## 2015-09-29 LAB — CBC WITH DIFFERENTIAL/PLATELET
Basophils Absolute: 0 10*3/uL (ref 0.0–0.1)
Basophils Absolute: 0 10*3/uL (ref 0.0–0.1)
Basophils Relative: 0 %
Basophils Relative: 0 %
EOS ABS: 0.2 10*3/uL (ref 0.0–0.7)
EOS PCT: 2 %
EOS PCT: 3 %
Eosinophils Absolute: 0.2 10*3/uL (ref 0.0–0.7)
HCT: 37.6 % — ABNORMAL LOW (ref 39.0–52.0)
HEMATOCRIT: 33.6 % — AB (ref 39.0–52.0)
Hemoglobin: 12.3 g/dL — ABNORMAL LOW (ref 13.0–17.0)
Hemoglobin: 10.8 g/dL — ABNORMAL LOW (ref 13.0–17.0)
LYMPHS ABS: 1.9 10*3/uL (ref 0.7–4.0)
LYMPHS ABS: 2.2 10*3/uL (ref 0.7–4.0)
LYMPHS PCT: 19 %
LYMPHS PCT: 29 %
MCH: 30.2 pg (ref 26.0–34.0)
MCH: 30.6 pg (ref 26.0–34.0)
MCHC: 32.1 g/dL (ref 30.0–36.0)
MCHC: 32.7 g/dL (ref 30.0–36.0)
MCV: 93.5 fL (ref 78.0–100.0)
MCV: 93.9 fL (ref 78.0–100.0)
MONO ABS: 1.1 10*3/uL — AB (ref 0.1–1.0)
MONOS PCT: 11 %
Monocytes Absolute: 0.7 10*3/uL (ref 0.1–1.0)
Monocytes Relative: 8 %
NEUTROS ABS: 4.6 10*3/uL (ref 1.7–7.7)
Neutro Abs: 6.7 10*3/uL (ref 1.7–7.7)
Neutrophils Relative %: 68 %
Neutrophils Relative %: 60 %
PLATELETS: 176 10*3/uL (ref 150–400)
PLATELETS: 192 10*3/uL (ref 150–400)
RBC: 3.58 MIL/uL — AB (ref 4.22–5.81)
RBC: 4.02 MIL/uL — AB (ref 4.22–5.81)
RDW: 14.8 % (ref 11.5–15.5)
RDW: 14.8 % (ref 11.5–15.5)
WBC: 7.8 10*3/uL (ref 4.0–10.5)
WBC: 9.9 10*3/uL (ref 4.0–10.5)

## 2015-09-29 LAB — COMPREHENSIVE METABOLIC PANEL
ALBUMIN: 2.8 g/dL — AB (ref 3.5–5.0)
ALK PHOS: 45 U/L (ref 38–126)
ALT: 21 U/L (ref 17–63)
ALT: 26 U/L (ref 17–63)
AST: 35 U/L (ref 15–41)
AST: 47 U/L — AB (ref 15–41)
Albumin: 3.3 g/dL — ABNORMAL LOW (ref 3.5–5.0)
Alkaline Phosphatase: 56 U/L (ref 38–126)
Anion gap: 10 (ref 5–15)
Anion gap: 7 (ref 5–15)
BILIRUBIN TOTAL: 0.3 mg/dL (ref 0.3–1.2)
BUN: 12 mg/dL (ref 6–20)
BUN: 17 mg/dL (ref 6–20)
CHLORIDE: 109 mmol/L (ref 101–111)
CO2: 25 mmol/L (ref 22–32)
CO2: 24 mmol/L (ref 22–32)
CREATININE: 0.87 mg/dL (ref 0.61–1.24)
CREATININE: 0.88 mg/dL (ref 0.61–1.24)
Calcium: 8.7 mg/dL — ABNORMAL LOW (ref 8.9–10.3)
Calcium: 7.8 mg/dL — ABNORMAL LOW (ref 8.9–10.3)
Chloride: 105 mmol/L (ref 101–111)
GFR calc Af Amer: >60 mL/min (ref >60)
GFR calc non Af Amer: >60 mL/min (ref >60)
GLUCOSE: 146 mg/dL — AB (ref 65–99)
Glucose, Bld: 101 mg/dL — ABNORMAL HIGH (ref 65–99)
POTASSIUM: 3.6 mmol/L (ref 3.5–5.1)
Potassium: 3.9 mmol/L (ref 3.5–5.1)
SODIUM: 140 mmol/L (ref 135–145)
Sodium: 140 mmol/L (ref 135–145)
Total Bilirubin: 0.5 mg/dL (ref 0.3–1.2)
Total Protein: 6.9 g/dL (ref 6.5–8.1)
Total Protein: 5.8 g/dL — ABNORMAL LOW (ref 6.5–8.1)

## 2015-09-29 LAB — CK
CK TOTAL: 990 U/L — AB (ref 49–397)
Total CK: 1481 U/L — ABNORMAL HIGH (ref 49–397)

## 2015-09-29 LAB — I-STAT CG4 LACTIC ACID, ED: LACTIC ACID, VENOUS: 0.86 mmol/L (ref 0.5–2.0)

## 2015-09-29 LAB — SEDIMENTATION RATE: Sed Rate: 45 mm/hr — ABNORMAL HIGH (ref 0–16)

## 2015-09-29 LAB — MAGNESIUM: Magnesium: 1.8 mg/dL (ref 1.7–2.4)

## 2015-09-29 LAB — PHENOBARBITAL LEVEL

## 2015-09-29 LAB — PHENYTOIN LEVEL, TOTAL: Phenytoin Lvl: <2.5 ug/mL — ABNORMAL LOW (ref 10.0–20.0)

## 2015-09-29 LAB — C-REACTIVE PROTEIN: CRP: 4.8 mg/dL — ABNORMAL HIGH (ref <1.0)

## 2015-09-29 LAB — PROTIME-INR
INR: 0.93 (ref 0.00–1.49)
Prothrombin Time: 12.7 seconds (ref 11.6–15.2)

## 2015-09-29 LAB — TSH: TSH: 1.525 u[IU]/mL (ref 0.350–4.500)

## 2015-09-29 LAB — MRSA PCR SCREENING: MRSA BY PCR: NEGATIVE

## 2015-09-29 MED ORDER — FOLIC ACID 1 MG PO TABS
1.0000 mg | ORAL_TABLET | Freq: Every day | ORAL | Status: DC
  Administered 2015-09-29 – 2015-10-03 (×5): 1 mg via ORAL
  Filled 2015-09-29 (×5): qty 1

## 2015-09-29 MED ORDER — SODIUM CHLORIDE 0.9 % IV BOLUS (SEPSIS)
1000.0000 mL | Freq: Once | INTRAVENOUS | Status: AC
  Administered 2015-09-29: 1000 mL via INTRAVENOUS

## 2015-09-29 MED ORDER — LORAZEPAM 1 MG PO TABS: 1.0000 mg | ORAL_TABLET | Freq: Four times a day (QID) | ORAL | Status: DC | PRN

## 2015-09-29 MED ORDER — METOPROLOL TARTRATE 25 MG PO TABS
25.0000 mg | ORAL_TABLET | Freq: Two times a day (BID) | ORAL | Status: DC
  Administered 2015-09-29 – 2015-09-30 (×3): 25 mg via ORAL
  Filled 2015-09-29 (×3): qty 1

## 2015-09-29 MED ORDER — CETYLPYRIDINIUM CHLORIDE 0.05 % MT LIQD
7.0000 mL | Freq: Two times a day (BID) | OROMUCOSAL | Status: DC
  Administered 2015-09-29 – 2015-10-03 (×9): 7 mL via OROMUCOSAL

## 2015-09-29 MED ORDER — VANCOMYCIN HCL IN DEXTROSE 1-5 GM/200ML-% IV SOLN
1000.0000 mg | Freq: Two times a day (BID) | INTRAVENOUS | Status: DC
  Administered 2015-09-29 – 2015-09-30 (×2): 1000 mg via INTRAVENOUS
  Filled 2015-09-29 (×2): qty 200

## 2015-09-29 MED ORDER — ONDANSETRON HCL 4 MG PO TABS: 4.0000 mg | ORAL_TABLET | Freq: Four times a day (QID) | ORAL | Status: DC | PRN

## 2015-09-29 MED ORDER — ENOXAPARIN SODIUM 40 MG/0.4ML ~~LOC~~ SOLN
40.0000 mg | SUBCUTANEOUS | Status: DC
  Filled 2015-09-29: qty 0.4

## 2015-09-29 MED ORDER — CLOTRIMAZOLE 1 % EX CREA
TOPICAL_CREAM | Freq: Two times a day (BID) | CUTANEOUS | Status: DC
  Administered 2015-09-29 – 2015-09-30 (×3): via TOPICAL
  Administered 2015-10-01 (×2): 1 via TOPICAL
  Administered 2015-10-02 – 2015-10-03 (×3): via TOPICAL
  Filled 2015-09-29: qty 15

## 2015-09-29 MED ORDER — LORAZEPAM 2 MG/ML IJ SOLN
0.0000 mg | Freq: Two times a day (BID) | INTRAMUSCULAR | Status: AC
Start: 2015-10-01 — End: 2015-10-03
  Administered 2015-10-01: 1 mg via INTRAVENOUS
  Filled 2015-09-29: qty 1

## 2015-09-29 MED ORDER — PIPERACILLIN-TAZOBACTAM 3.375 G IVPB
3.3750 g | Freq: Three times a day (TID) | INTRAVENOUS | Status: DC
  Administered 2015-09-29: 3.375 g via INTRAVENOUS
  Filled 2015-09-29: qty 50

## 2015-09-29 MED ORDER — ACETAMINOPHEN 325 MG PO TABS: 650.0000 mg | ORAL_TABLET | Freq: Four times a day (QID) | ORAL | Status: DC | PRN

## 2015-09-29 MED ORDER — LORAZEPAM 2 MG/ML IJ SOLN: 0.0000 mg | Freq: Four times a day (QID) | INTRAMUSCULAR | Status: DC

## 2015-09-29 MED ORDER — VITAMIN B-1 100 MG PO TABS
100.0000 mg | ORAL_TABLET | Freq: Every day | ORAL | Status: DC
  Administered 2015-09-29 – 2015-10-03 (×5): 100 mg via ORAL
  Filled 2015-09-29 (×5): qty 1

## 2015-09-29 MED ORDER — PHENOBARBITAL 32.4 MG PO TABS
64.8000 mg | ORAL_TABLET | Freq: Every day | ORAL | Status: DC
  Administered 2015-09-29 – 2015-10-02 (×4): 64.8 mg via ORAL
  Filled 2015-09-29 (×4): qty 2

## 2015-09-29 MED ORDER — THIAMINE HCL 100 MG/ML IJ SOLN
100.0000 mg | Freq: Every day | INTRAMUSCULAR | Status: DC
  Filled 2015-09-29 (×2): qty 2

## 2015-09-29 MED ORDER — ONDANSETRON HCL 4 MG/2ML IJ SOLN: 4.0000 mg | Freq: Four times a day (QID) | INTRAMUSCULAR | Status: DC | PRN

## 2015-09-29 MED ORDER — LORAZEPAM 2 MG/ML IJ SOLN: 1.0000 mg | Freq: Four times a day (QID) | INTRAMUSCULAR | Status: DC | PRN

## 2015-09-29 MED ORDER — SODIUM CHLORIDE 0.9 % IV SOLN
INTRAVENOUS | Status: DC
  Administered 2015-09-29: 06:00:00 via INTRAVENOUS

## 2015-09-29 MED ORDER — VANCOMYCIN HCL IN DEXTROSE 1-5 GM/200ML-% IV SOLN
1000.0000 mg | Freq: Once | INTRAVENOUS | Status: AC
  Administered 2015-09-29: 1000 mg via INTRAVENOUS
  Filled 2015-09-29: qty 200

## 2015-09-29 MED ORDER — VITAMIN B-1 100 MG PO TABS: 100.0000 mg | ORAL_TABLET | Freq: Every day | ORAL | Status: DC

## 2015-09-29 MED ORDER — LORAZEPAM 2 MG/ML IJ SOLN: 0.0000 mg | Freq: Four times a day (QID) | INTRAMUSCULAR | Status: AC

## 2015-09-29 MED ORDER — THIAMINE HCL 100 MG/ML IJ SOLN: 100.0000 mg | Freq: Every day | INTRAMUSCULAR | Status: DC

## 2015-09-29 MED ORDER — ACETAMINOPHEN 650 MG RE SUPP: 650.0000 mg | Freq: Four times a day (QID) | RECTAL | Status: DC | PRN

## 2015-09-29 MED ORDER — SODIUM CHLORIDE 0.9 % IV SOLN
INTRAVENOUS | Status: DC
  Administered 2015-09-29 – 2015-09-30 (×2): via INTRAVENOUS

## 2015-09-29 MED ORDER — PIPERACILLIN-TAZOBACTAM 3.375 G IVPB
3.3750 g | Freq: Three times a day (TID) | INTRAVENOUS | Status: DC
  Administered 2015-09-29 – 2015-10-03 (×12): 3.375 g via INTRAVENOUS
  Filled 2015-09-29 (×12): qty 50

## 2015-09-29 MED ORDER — LORAZEPAM 2 MG/ML IJ SOLN: 0.0000 mg | Freq: Two times a day (BID) | INTRAMUSCULAR | Status: DC

## 2015-09-29 MED ORDER — ADULT MULTIVITAMIN W/MINERALS CH
1.0000 | ORAL_TABLET | Freq: Every day | ORAL | Status: DC
  Administered 2015-09-29 – 2015-10-03 (×5): 1 via ORAL
  Filled 2015-09-29 (×5): qty 1

## 2015-09-29 MED ORDER — IOHEXOL 300 MG/ML  SOLN
100.0000 mL | Freq: Once | INTRAMUSCULAR | Status: AC | PRN
  Administered 2015-09-29: 80 mL via INTRAVENOUS

## 2015-09-29 MED ORDER — MORPHINE SULFATE (PF) 2 MG/ML IV SOLN
1.0000 mg | INTRAVENOUS | Status: DC | PRN
  Administered 2015-09-29 (×2): 1 mg via INTRAVENOUS
  Filled 2015-09-29 (×2): qty 1

## 2015-09-29 NOTE — BH Assessment (Signed)
Spoke with Joycie PeekBenjamin Cartner, PA-C regarding TTS consult and was informed that pt would be admitted medically and TTS consulted was not needed at this time.

## 2015-09-29 NOTE — Progress Notes (Signed)
*  PRELIMINARY RESULTS* Vascular Ultrasound Lower extremity venous duplex has been completed.  Preliminary findings: No obvious evidence of DVT or baker's cyst.   ABI completed: Bilateral severe arterial disease.     RIGHT    LEFT    PRESSURE WAVEFORM  PRESSURE WAVEFORM  BRACHIAL 141  Tri BRACHIAL N/A IV location   DP   DP    AT 70 Damp mono AT 69 Damp mono  PT 67 Damp mono PT 65 Damp mono  PER   PER    GREAT TOE  NA GREAT TOE  NA    RIGHT LEFT  ABI 0.5 0.49    Farrel DemarkJill Eunice, RDMS, RVT  09/29/2015, 1:56 PM

## 2015-09-29 NOTE — ED Notes (Signed)
Vascular at bedside

## 2015-09-29 NOTE — Progress Notes (Signed)
Pharmacy Antibiotic Note  Miguel [name] is a 65 y.o. male admitted on 09/28/2015 with cellulitis.  Pharmacy has been consulted for Vancomycin and Zosyn  dosing.  Plan: Vancomycin 1gm  IV every 12 hours.  Goal trough 10-15 mcg/mL. Zosyn 3.375g IV q8h (4 hour infusion).  Height: 6' (182.9 cm) Weight: 161 lb 1.6 oz (73.074 kg) IBW/kg (Calculated) : 77.6  Temp (24hrs), Avg:97.9 F (36.6 C), Min:97.9 F (36.6 C), Max:97.9 F (36.6 C)   Recent Labs Lab 09/29/15 0055 09/29/15 0532  WBC 9.9  --   CREATININE 0.87  --   LATICACIDVEN  --  0.86    Estimated Creatinine Clearance: 88.7 mL/min (by C-G formula based on Cr of 0.87).    No Known Allergies  Antimicrobials this admission: Vancomycin 3/3 >>  Zosyn 3/3 >>    Thank you for allowing pharmacy to be a part of this patient's care.  Aleene DavidsonGrimsley Jr, Carder Yin Crowford 09/29/2015 6:19 AM
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
## 2015-09-29 NOTE — Progress Notes (Signed)
Utilization Review completed.  Laveda Demedeiros RN CM  

## 2015-09-29 NOTE — Consult Note (Signed)
WOC wound consult note Reason for Consult: right groin Contacted bedside nurse to discuss groin, based on notes was not sure if patient has open wound or not.  Bedside nurse reports some superficial skin peeling on the bilateral inner thighs adjacent to the scrotum with odor and redness consistent with fungal overgrowth.  ABI results reviewed, pending ortho for RLE with ABI of 0.5 Wound type: fungal overgrowth with superficial skin loss Pressure Ulcer POA: No Dressing procedure/placement/frequency: WOC added antifungal cream BID MD may want to consider Diflucan if redness persistent  and worsening   Discussed POC  bedside nurse.  Re consult if needed, will not follow at this time. Thanks  Rinda Rollyson [employer] RN, CWOCN [phone])

## 2015-09-29 NOTE — ED Notes (Signed)
Ultrasound at bedside

## 2015-09-29 NOTE — ED Notes (Signed)
Patient transported to CT 

## 2015-09-29 NOTE — ED Notes (Signed)
SW [place] at bedside consulting
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
## 2015-09-29 NOTE — Progress Notes (Signed)
Pt seen and examined at bedside. Please see Dr. Toniann FailKakrakandy admission note. Ortho team consulted for assistance.   Debbora PrestoMAGICK-Elan Brainerd, MD  [facility] Pager [phone] Cell [phone]  If 7PM-7AM, please contact night-coverage [email or web address] Password [email or web address]

## 2015-09-29 NOTE — Clinical Social Work Note (Signed)
Clinical Social Work Assessment  Patient Details  Name: Miguel [name] MRN: [id number] Date of Birth: [birth year]  Date of referral:  09/29/15               Reason for consult:  Other (Comment Required) (Homeless issues)                Permission sought to share information with:    Permission granted to share information::     Name::        Agency::     Relationship::     Contact Information:     Housing/Transportation Living arrangements for the past 2 months:   (Patient reports he had been living with his girlfriend, Miguel [name] (no contact number to provide)) Source of Information:  Patient Patient Interpreter Needed:  None Criminal Activity/Legal Involvement Pertinent to Current Situation/Hospitalization:  No - Comment as needed (Unknown) Significant Relationships:  Significant Other (Patient reports he has a girlfriend, Miguel [name]) Lives with:  Significant Other (Patient reports he lives with his girlfriend) Do you feel safe going back to the place where you live?    Need for family participation in patient care:     Care giving concerns: Unknown at this time   Office managerocial Worker assessment / plan: CSW spoke with patient at bedside with no family present. At times, it was difficult for CSW to understand what patient saying during the visit. [facility] Patient reports he does not know what is going on with his leg. Patient reports he has never lived in a homeless shelter. Patient reports he lives with his girlfriend, Miguel [name], however, patient has no contact number to provide. Patient reports his address is [address], [place], [place]. Patient reports he can complete his own ADL's and he uses a cane. No questions noted for CSW at this time.  Employment status:    Insurance information:  Medicare PT Recommendations:  Not assessed at this time Information / Referral to community resources:  Other (Comment Required) (None  given)  Patient/Family's Response to care: Unknown at this time   Patient/Family's Understanding of and Emotional Response to Diagnosis, Current Treatment, and Prognosis: Unknown at this time  Emotional Assessment Appearance:  Appears stated age Attitude/Demeanor/Rapport:    Affect (typically observed):    Orientation:    Alcohol / Substance use:  Alcohol Use (Per MD note, patient has history of alcohol use) Psych involvement (Current and /or in the community):  No (Comment)  Discharge Needs  Concerns to be addressed:  Other (Comment Required (Unknown) Readmission within the last 30 days:    Current discharge risk:  Other (Unknown) Barriers to Discharge:  Other (Unknown)   Claudean SeveranceLaVonia M Maxum Cassarino, LCSW 09/29/2015, 8:53 AM

## 2015-09-30 LAB — CBC
HCT: 34.6 % — ABNORMAL LOW (ref 39.0–52.0)
Hemoglobin: 11 g/dL — ABNORMAL LOW (ref 13.0–17.0)
MCH: 30.3 pg (ref 26.0–34.0)
MCHC: 31.8 g/dL (ref 30.0–36.0)
MCV: 95.3 fL (ref 78.0–100.0)
PLATELETS: 173 10*3/uL (ref 150–400)
RBC: 3.63 MIL/uL — ABNORMAL LOW (ref 4.22–5.81)
RDW: 15.2 % (ref 11.5–15.5)
WBC: 6.7 10*3/uL (ref 4.0–10.5)

## 2015-09-30 LAB — BASIC METABOLIC PANEL
ANION GAP: 7 (ref 5–15)
BUN: 10 mg/dL (ref 6–20)
CHLORIDE: 108 mmol/L (ref 101–111)
CO2: 25 mmol/L (ref 22–32)
Calcium: 8 mg/dL — ABNORMAL LOW (ref 8.9–10.3)
Creatinine, Ser: 0.95 mg/dL (ref 0.61–1.24)
GFR calc non Af Amer: >60 mL/min (ref >60)
Glucose, Bld: 100 mg/dL — ABNORMAL HIGH (ref 65–99)
Potassium: 4 mmol/L (ref 3.5–5.1)
Sodium: 140 mmol/L (ref 135–145)

## 2015-09-30 MED ORDER — VANCOMYCIN HCL IN DEXTROSE 1-5 GM/200ML-% IV SOLN
1000.0000 mg | Freq: Once | INTRAVENOUS | Status: AC
  Administered 2015-09-30: 1000 mg via INTRAVENOUS

## 2015-09-30 MED ORDER — HYDRALAZINE HCL 20 MG/ML IJ SOLN
5.0000 mg | INTRAMUSCULAR | Status: DC | PRN
  Administered 2015-10-02 – 2015-10-03 (×2): 5 mg via INTRAVENOUS
  Filled 2015-09-30 (×2): qty 1

## 2015-09-30 MED ORDER — VANCOMYCIN HCL IN DEXTROSE 1-5 GM/200ML-% IV SOLN
1000.0000 mg | Freq: Two times a day (BID) | INTRAVENOUS | Status: DC
  Administered 2015-09-30 – 2015-10-02 (×3): 1000 mg via INTRAVENOUS
  Filled 2015-09-30 (×2): qty 200

## 2015-09-30 NOTE — Progress Notes (Addendum)
Patient ID: Miguel [name], male   DOB: Oct 11, 1950, 65 y.o.   MRN: [id number]  [facility] PROGRESS NOTE  Miguel [name] WUJ:[id number] DOB: Jun 20, 1951 DOA: 09/28/2015 PCP: [facility]   Brief narrative:    65 y.o. male with history of alcoholism, seizures, VP shunt, hypertension who has not been compliant with his medications, presented to [facility] ED with main concern of progressive right lower extremity pain. Please note that pt was unable to provide much history due to acute illness.and ? Intoxication. [facility] asked to admit for further evaluation.   Assessment/Plan:    1. Lower extremity pain - appears to be underlying cellulitis, continue current ABX and narrow down as clinically indicated, WOC consulted  2. Rhabdomyolysis - continue with hydration, repeat CK level in AM 3. Alcohol abuse - keep on CIWA protocol. 4. History of hypertension patient has been noncompliant with his medications - place on Hydralazine for now, due to cocaine use will stop Metoprolol  5. History of seizures - was recently changed to phenobarbital. Continue phenobarbital dose for now. 6. Chronic anemia - follow CBC.  DVT prophylaxis - SCD's  Code Status: Full.  Family Communication:  plan of care discussed with the patient Disposition Plan: Home by 3/7  IV access:  Peripheral IV  Procedures and diagnostic studies:    Ct Head Wo Contrast  Oct 29, 2015  CLINICAL DATA:  Assess ventriculoperitoneal shunt. Status post fall. Initial encounter. EXAM: CT HEAD WITHOUT CONTRAST TECHNIQUE: Contiguous axial images were obtained from the base of the skull through the vertex without intravenous contrast. COMPARISON:  CT of the head performed 07/06/2015 FINDINGS: There is no evidence of acute infarction, mass lesion, or intra- or extra-axial hemorrhage on CT. Prominence of the ventricles and sulci reflects moderate cortical volume loss. A chronic infarct is noted at the right frontal lobe, with dense calcification and ex  vacuo dilatation of the frontal horn of the right lateral ventricle. There is involvement of the right basal ganglia. Chronic encephalomalacia is also noted at the temporal lobes bilaterally and left frontal lobe, reflecting remote infarct. The patient's ventriculoperitoneal shunt is noted ending at the right lateral ventricle. The brainstem and fourth ventricle are within normal limits. The basal ganglia are unremarkable in appearance. The cerebral hemispheres demonstrate grossly normal gray-white differentiation. No mass effect or midline shift is seen. There is no evidence of fracture; visualized osseous structures are unremarkable in appearance. The visualized portions of the orbits are within normal limits. Mucosal thickening is noted at the maxillary sinuses bilaterally, and there is mild partial opacification of the frontal sinuses. The remaining paranasal sinuses and mastoid air cells are well-aerated. No significant soft tissue abnormalities are seen. IMPRESSION: 1. No acute intracranial pathology seen on CT. 2. Ventriculoperitoneal shunt noted ending at the right lateral ventricle. 3. Moderate cortical volume loss. 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 4. Chronic encephalomalacia at the temporal lobes bilaterally and left frontal lobe, reflecting remote infarct. 5. Mucosal thickening at the maxillary sinuses bilaterally, and mild partial opacification of the frontal sinuses. Electronically Signed   By: Roanna Raider M.D.   On: 2015/10/29 06:49   Ct Pelvis W Contrast  29-Oct-2015  CLINICAL DATA:  Acute onset of moderate right thigh pain. Initial encounter. EXAM: CT PELVIS AND RIGHT LOWER EXTREMITY WITH CONTRAST TECHNIQUE: Multidetector CT imaging of the pelvis and right lower extremity was performed according to the standard protocol  following bolus administration of intravenous contrast. Multiplanar  CT image reconstructions were also generated. CONTRAST:  80mL OMNIPAQUE IOHEXOL 300 MG/ML  SOLN COMPARISON:  None. FINDINGS: CT PELVIS FINDINGS Visualized small and large bowel loops are grossly unremarkable. Scattered calcification is noted along the distal abdominal aorta and its branches. An IVC filter is relatively inferior in position, just above the junction of the common iliac veins. Prominent bilateral pelvic vasculature is noted, particularly on the left, and there is a clump of soft tissue density along the right inguinal canal, thought to reflect a large right-sided varicocele. Visualized inguinal nodes remain normal in size. The bladder is largely decompressed and not well assessed. No acute osseous abnormalities are seen. CT RIGHT LOWER EXTREMITY FINDINGS There is relatively diffuse soft tissue edema tracking along the right thigh, particularly prominent laterally, and worsening about the level of the knee. More mild edema is noted about the left thigh. Mild skin thickening is suggested along the medial right thigh, raising question for cellulitis. The underlying osseous structures are grossly unremarkable. The knee joint is unremarkable in appearance. Trace knee joint fluid remains within normal limits. The visualized musculature is grossly unremarkable. Scattered vascular calcifications are seen. IMPRESSION: 1. Relatively diffuse soft tissue edema tracking along the right thigh, particularly prominent laterally, and worsening about the level of the knee. More mild edema is also seen about the left thigh. Mild skin thickening suggested along the medial right thigh, raising question for cellulitis. 2. Clump of soft tissue density along the right inguinal canal is thought reflect a large right-sided varicocele. Would correlate for associated symptoms. 3. Prominent bilateral pelvic vasculature noted, particularly on the left, of uncertain significance. This may reflect underlying chronic venous  obstruction or increased venous pressure. 4. Scattered calcification along the distal abdominal aorta and its branches. Electronically Signed   By: Roanna RaiderJeffery  Chang M.D.   On: 09/29/2015 05:05   [place] Pelvis Limited  09/29/2015  CLINICAL DATA:  Right groin pain.  History of IVC filter. EXAM: LIMITED ULTRASOUND OF PELVIS TECHNIQUE: Limited transabdominal ultrasound examination of the pelvis was performed. COMPARISON:  CT pelvis from earlier today. FINDINGS: Targeted ultrasound of the right groin demonstrates clustered tortuous dilated vessels measuring up to 0.8 cm in diameter, which appear to be traversing inferiorly into the right inguinal canal and superiorly into the right lower quadrant of the abdomen. There are no abnormal masses or fluid collections demonstrated. There are two sonographically benign-appearing adjacent right inguinal lymph nodes measuring 0.7 cm and 0.5 cm in short axis with no hypervascularity, no effacement of the fatty hilum and no cortical heterogeneity. IMPRESSION: 1. Clustered dilated tortuous vessels in the right groin traveling inferiorly in the right inguinal canal and superiorly into the right lower quadrant of the abdomen. 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 2. No abnormal mass or fluid collection in the right groin. Electronically Signed   By: Delbert PhenixJason A Poff M.D.   On: 09/29/2015 17:15   Ct Femur Right W Contrast  09/29/2015  CLINICAL DATA:  Acute onset of moderate right thigh pain. Initial encounter. EXAM: CT PELVIS AND RIGHT LOWER EXTREMITY WITH CONTRAST TECHNIQUE: Multidetector CT imaging of the pelvis and right lower extremity was performed according to the standard protocol following bolus administration of intravenous contrast.  Multiplanar CT  image reconstructions were also generated. CONTRAST:  80mL OMNIPAQUE IOHEXOL 300 MG/ML  SOLN COMPARISON:  None. FINDINGS: CT PELVIS FINDINGS Visualized small and large bowel loops are grossly unremarkable. Scattered calcification is noted along the distal abdominal aorta and its branches. An IVC filter is relatively inferior in position, just above the junction of the common iliac veins. Prominent bilateral pelvic vasculature is noted, particularly on the left, and there is a clump of soft tissue density along the right inguinal canal, thought to reflect a large right-sided varicocele. Visualized inguinal nodes remain normal in size. The bladder is largely decompressed and not well assessed. No acute osseous abnormalities are seen. CT RIGHT LOWER EXTREMITY FINDINGS There is relatively diffuse soft tissue edema tracking along the right thigh, particularly prominent laterally, and worsening about the level of the knee. More mild edema is noted about the left thigh. Mild skin thickening is suggested along the medial right thigh, raising question for cellulitis. The underlying osseous structures are grossly unremarkable. The knee joint is unremarkable in appearance. Trace knee joint fluid remains within normal limits. The visualized musculature is grossly unremarkable. Scattered vascular calcifications are seen. IMPRESSION: 1. Relatively diffuse soft tissue edema tracking along the right thigh, particularly prominent laterally, and worsening about the level of the knee. More mild edema is also seen about the left thigh. Mild skin thickening suggested along the medial right thigh, raising question for cellulitis. 2. Clump of soft tissue density along the right inguinal canal is thought reflect a large right-sided varicocele. Would correlate for associated symptoms. 3. Prominent bilateral pelvic vasculature noted, particularly on the left, of uncertain significance. This may reflect underlying chronic  venous obstruction or increased venous pressure. 4. Scattered calcification along the distal abdominal aorta and its branches. Electronically Signed   By: Roanna Raider M.D.   On: 09/29/2015 05:05    Medical Consultants:  WOC Vascular surgery   Other Consultants:  PT  IAnti-Infectives:   Vancomycin 3/3 --> Zosyn 3/3 -->  Debbora Presto, MD  [facility] Pager [phone]  If 7PM-7AM, please contact night-coverage [email or web address] Password [facility] 09/30/2015, 8:57 PM   LOS: 1 day   HPI/Subjective: No events overnight.   Objective: Filed Vitals:   09/29/15 1949 09/29/15 2356 09/30/15 0446 09/30/15 1500  BP: 152/81 117/67 153/94 153/98  Pulse: 78 69 71 64  Temp: 98.3 F (36.8 C) 98.7 F (37.1 C) 98.4 F (36.9 C) 97.7 F (36.5 C)  TempSrc: Oral Oral Oral Oral  Resp: Height:      Weight:      SpO2: 98% 97% 100% 98%    Intake/Output Summary (Last 24 hours) at 09/30/15 2057 Last data filed at 09/30/15 1700  Gross per 24 hour  Intake   2750 ml  Output   1550 ml  Net   1200 ml    Exam:   General:  Pt is alert, follows commands appropriately, not in acute distress  Cardiovascular: Regular rate and rhythm, no rubs, no gallops  Respiratory: Clear to auscultation bilaterally, no wheezing, no crackles, no rhonchi  Abdomen: Soft, non tender, non distended, bowel sounds present, no guarding  Extremities: RLE swelling and erythema with TTP   Data Reviewed: Basic Metabolic Panel:  Recent Labs Lab 09/29/15 0055 09/29/15 0806 09/30/15 0540  NA 140 140 140  K 3.9 3.6 4.0  CL 105 109 108  CO2 GLUCOSE 146* 101* 100*  BUN CREATININE 0.87 0.88 0.95  CALCIUM  8.7* 7.8* 8.0*  MG  --  1.8  --    Liver Function Tests:  Recent Labs Lab 09/29/15 0055 09/29/15 0806  AST 47* 35  ALT 26 21  ALKPHOS 56 45  BILITOT 0.3 0.5  PROT 6.9 5.8*  ALBUMIN 3.3* 2.8*   CBC:  Recent Labs Lab 09/29/15 0055 09/29/15 0806 09/30/15 0540  WBC 9.9  7.8 6.7  NEUTROABS 6.7 4.6  --   HGB 12.3* 10.8* 11.0*  HCT 37.6* 33.6* 34.6*  MCV 93.5 93.9 95.3  PLT 192 176 173   Cardiac Enzymes:  Recent Labs Lab 09/29/15 0055 09/29/15 0806 09/29/15 1245 09/29/15 1904  CKTOTAL 1481* 990*  --   --   TROPONINI  --  <0.03 <0.03 <0.03   BNP: Invalid input(s): POCBNP CBG: No results for input(s): GLUCAP in the last 168 hours.  Recent Results (from the past 240 hour(s))  Wound culture     Status: None (Preliminary result)   Collection Time: 09/29/15 12:02 PM  Result Value Ref Range Status   Specimen Description WOUND RIGHT THIGH  Final   Special Requests NONE  Final   Gram Stain   Final    FEW WBC PRESENT,BOTH PMN AND MONONUCLEAR RARE SQUAMOUS EPITHELIAL CELLS PRESENT FEW GRAM POSITIVE COCCI IN PAIRS RARE GRAM NEGATIVE RODS Performed at [facility]    Culture   Final    Culture reincubated for better growth Performed at [facility]    Report Status PENDING  Incomplete  MRSA PCR Screening     Status: None   Collection Time: 09/29/15  9:09 PM  Result Value Ref Range Status   MRSA by PCR NEGATIVE NEGATIVE Final    Comment:        The GeneXpert MRSA Assay (FDA approved for NASAL specimens only), is one component of a comprehensive MRSA colonization surveillance program. It is not intended to diagnose MRSA infection nor to guide or monitor treatment for MRSA infections.      Scheduled Meds: . antiseptic oral rinse  7 mL Mouth Rinse BID  . clotrimazole   Topical BID  . folic acid  1 mg Oral Daily  . LORazepam  0-4 mg Intravenous Q6H   Followed by  . [START ON 10/01/2015] LORazepam  0-4 mg Intravenous Q12H  . metoprolol tartrate  25 mg Oral BID  . multivitamin with minerals  1 tablet Oral Daily  . PHENobarbital  64.8 mg Oral QHS  . piperacillin-tazobactam (ZOSYN)  IV  3.375 g Intravenous 3 times per day  . thiamine  100 mg Oral Daily   Or  . thiamine  100 mg Intravenous Daily  . [START ON 10/01/2015]  vancomycin  1,000 mg Intravenous Q12H   Continuous Infusions:

## 2015-09-30 NOTE — Evaluation (Signed)
Physical Therapy Evaluation Patient Details Name: Miguel [name] Miguel [name] MRN: [id number] DOB: 19-Aug-1950 Today's Date: 09/30/2015   History of Present Illness  65 yo male admitted with R LE pain, rhabdomyolysis. Hx of CVA, HTN, DVT, Sz, A fib, PAD, ETOH abuse.   Clinical Impression  On eval, pt required Min assist for mobility-walked ~50 feet with RW. Pain rated 8/10 R LE/inner thigh. Discussed d/c plan-pt stated he planned to return home with girlfriend assisting as needed. Will follow and progress activity as able.    Follow Up Recommendations Home health PT vs SNF;Supervision/Assistance - 24 hour (depending on progress and level of assist available at home)    Equipment Recommendations  Rolling walker with 5" wheels    Recommendations for Other Services       Precautions / Restrictions Precautions Precautions: Fall Restrictions Weight Bearing Restrictions: No      Mobility  Bed Mobility Overal bed mobility: Needs Assistance Bed Mobility: Supine to Sit     Supine to sit: Min assist;HOB elevated     General bed mobility comments: Assist for R LE and to scoot to EOB. Increased time.   Transfers Overall transfer level: Needs assistance Equipment used: Rolling walker (2 wheeled) Transfers: Sit to/from Stand Sit to Stand: Min assist;From elevated surface         General transfer comment: Assist to rise, stabilize, control descent. VCs safety, technique, hand placement.   Ambulation/Gait Ambulation/Gait assistance: Min guard Ambulation Distance (Feet): 50 Feet Assistive device: Rolling walker (2 wheeled) Gait Pattern/deviations: Step-to pattern;Trunk flexed     General Gait Details: slow gait speed. Moderate reliance on walker for support. Pt did report pain was a little less with walking  Stairs            Wheelchair Mobility    Modified Rankin (Stroke Patients Only)       Balance Overall balance assessment: Needs assistance         Standing balance  support: During functional activity Standing balance-Leahy Scale: Fair                               Pertinent Vitals/Pain Pain Assessment: 0-10 Pain Score: 8  Pain Location: R LE/inner thigh Pain Descriptors / Indicators: Sore Pain Intervention(s): Repositioned;Limited activity within patient's tolerance    Home Living Family/patient expects to be discharged to:: Private residence Living Arrangements: Spouse/significant other Available Help at Discharge:  ("girlfriend") Type of Home: House Home Access: Stairs to enter Entrance Stairs-Rails: None Secretary/administratorntrance Stairs-Number of Steps: 3 Home Layout: One level Home Equipment:  (walking stick)      Prior Function Level of Independence: Independent with assistive device(s)         Comments: uses walking stick at baseline     Hand Dominance        Extremity/Trunk Assessment   Upper Extremity Assessment: Overall WFL for tasks assessed           Lower Extremity Assessment: RLE deficits/detail RLE Deficits / Details: LAQ at least 3/5, hip abd/add 2/5 (all limited by pain)    Cervical / Trunk Assessment: Normal  Communication   Communication: No difficulties  Cognition Arousal/Alertness: Awake/alert Behavior During Therapy: WFL for tasks assessed/performed Overall Cognitive Status: Within Functional Limits for tasks assessed                      General Comments      Exercises  Assessment/Plan    PT Assessment Patient needs continued PT services  PT Diagnosis Difficulty walking;Abnormality of gait;Acute pain   PT Problem List Decreased strength;Decreased range of motion;Decreased activity tolerance;Decreased balance;Decreased mobility;Pain;Decreased knowledge of use of DME  PT Treatment Interventions DME instruction;Gait training;Functional mobility training;Therapeutic activities;Patient/family education;Balance training;Therapeutic exercise   PT Goals (Current goals can be found  in the Care Plan section) Acute Rehab PT Goals Patient Stated Goal: less pain PT Goal Formulation: With patient Time For Goal Achievement: 10/14/15 Potential to Achieve Goals: Good    Frequency Min 3X/week   Barriers to discharge        Co-evaluation               End of Session Equipment Utilized During Treatment: Gait belt Activity Tolerance: Patient limited by pain Patient left: in chair;with call bell/phone within reach;with chair alarm set           Time: 5427-0623 PT Time Calculation (min) (ACUTE ONLY): 17 min   Charges:   PT Evaluation $PT Eval Low Complexity: 1 Procedure     PT G Codes:        Rebeca Alert, MPT Pager: [phone]

## 2015-10-01 LAB — BASIC METABOLIC PANEL
Anion gap: 8 (ref 5–15)
BUN: 9 mg/dL (ref 6–20)
CHLORIDE: 104 mmol/L (ref 101–111)
CO2: 28 mmol/L (ref 22–32)
Calcium: 8.6 mg/dL — ABNORMAL LOW (ref 8.9–10.3)
Creatinine, Ser: 1.02 mg/dL (ref 0.61–1.24)
Glucose, Bld: 99 mg/dL (ref 65–99)
POTASSIUM: 4.4 mmol/L (ref 3.5–5.1)
SODIUM: 140 mmol/L (ref 135–145)

## 2015-10-01 LAB — CBC
HCT: 39.4 % (ref 39.0–52.0)
HEMOGLOBIN: 12.5 g/dL — AB (ref 13.0–17.0)
MCH: 29.9 pg (ref 26.0–34.0)
MCHC: 31.7 g/dL (ref 30.0–36.0)
MCV: 94.3 fL (ref 78.0–100.0)
PLATELETS: 219 10*3/uL (ref 150–400)
RBC: 4.18 MIL/uL — AB (ref 4.22–5.81)
RDW: 14.8 % (ref 11.5–15.5)
WBC: 7.9 10*3/uL (ref 4.0–10.5)

## 2015-10-01 MED ORDER — FLUCONAZOLE IN SODIUM CHLORIDE 100-0.9 MG/50ML-% IV SOLN
100.0000 mg | INTRAVENOUS | Status: DC
  Administered 2015-10-01 – 2015-10-02 (×2): 100 mg via INTRAVENOUS
  Filled 2015-10-01 (×3): qty 50

## 2015-10-01 NOTE — Progress Notes (Signed)
Patient ID: Miguel [name], male   DOB: 1950/12/08, 65 y.o.   MRN: [id number]  [facility] PROGRESS NOTE  Miguel [name] WUJ:[id number] DOB: 1950/12/08 DOA: 09/28/2015 PCP: [facility]   Brief narrative:    65 y.o. male with history of alcoholism, seizures, VP shunt, hypertension who has not been compliant with his medications, [facility] with main concern of progressive right lower extremity pain. Please note that pt was unable to provide much history due to acute illness.and ? Intoxication. [facility] asked to admit for further evaluation.   Assessment/Plan:    1. Lower extremity pain - appears to be underlying cellulitis, imposed on chronic peripheral arterial and venous insufficiency, ? Fungal infection, continue current ABX and narrow down as clinically indicated, WOC consulted, assistance appreciated. Added diflucan 2. Rhabdomyolysis - continue with hydration, repeat CK level in AM 3. Alcohol abuse - keep on CIWA protocol, no signs of withdrawal at this time  4. History of hypertension patient has been noncompliant with his medications - place on Hydralazine for now, due to cocaine use, Metoprolol was stopped  5. History of seizures - was recently changed to phenobarbital. Continue phenobarbital 6. Chronic anemia - follow CBC.  DVT prophylaxis - SCD's  Code Status: Full.  Family Communication:  plan of care discussed with the patient Disposition Plan: Home by 3/7  IV access:  Peripheral IV  Procedures and diagnostic studies:    Ct Head Wo Contrast 09/29/2015 No acute intracranial pathology seen on CT. 2. Ventriculoperitoneal shunt noted ending at the right lateral ventricle. 3. Moderate cortical volume loss. 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 4. Chronic encephalomalacia at the temporal lobes bilaterally and left frontal lobe, reflecting remote infarct. 5. Mucosal  thickening at the maxillary sinuses bilaterally, and mild partial opacification of the frontal sinuses.  [place] Pelvis Limited 09/29/2015 Clustered dilated tortuous vessels in the right groin traveling inferiorly in the right inguinal canal and superiorly into the right lower quadrant of the abdomen. 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 2. No abnormal mass or fluid collection in the right groin.  Ct Femur Right W Contrast 09/29/2015  Relatively diffuse soft tissue edema tracking along the right thigh, particularly prominent laterally, and worsening about the level of the knee. More mild edema is also seen about the left thigh. Mild skin thickening suggested along the medial right thigh, raising question for cellulitis. 2. Clump of soft tissue density along the right inguinal canal is thought reflect a large right-sided varicocele. Would correlate for associated symptoms. 3. Prominent bilateral pelvic vasculature noted, particularly on the left, of uncertain significance. This may reflect underlying chronic venous obstruction or increased venous pressure.   Medical Consultants:  WOC Vascular surgery   Other Consultants:  PT  IAnti-Infectives:   Vancomycin 3/3 --> Zosyn 3/3 --> Diflucan 3/6 -->  Debbora PrestoMAGICK-Pinki Rottman, MD  [facility] Pager [phone]  If 7PM-7AM, please contact night-coverage [email or web address] Password [email or web address] 10/01/2015, 12:12 PM   LOS: 2 days   HPI/Subjective: No events overnight.   Objective: Filed Vitals:   09/30/15 1500 09/30/15 2321 10/01/15 0536 10/01/15 0624  BP: 153/98 158/95 153/114 152/80  Pulse: 64 80 73 79  Temp:  97.7 F (36.5 C) 98.3 F (36.8 C) 98.9 F (37.2 C)   TempSrc: Oral Oral Oral   Resp: Height:      Weight:       SpO2: 98% 99% 100%     Intake/Output Summary (Last 24 hours) at 10/01/15 1212 Last data filed at 10/01/15 0809  Gross per 24 hour  Intake    960 ml  Output   1825 ml  Net   -865 ml    Exam:   General:  Pt is alert, follows commands appropriately, not in acute distress  Cardiovascular: Regular rate and rhythm, no rubs, no gallops  Respiratory: Clear to auscultation bilaterally, no wheezing, no crackles, no rhonchi  Abdomen: Soft, non tender, non distended, bowel sounds present, no guarding  Extremities: RLE swelling and erythema with TTP, slightly improved   Data Reviewed: Basic Metabolic Panel:  Recent Labs Lab 09/29/15 0055 09/29/15 0806 09/30/15 0540 10/01/15 0547  NA 140 140 140 140  K 3.9 3.6 4.0 4.4  CL 105 109 108 104  CO2 GLUCOSE 146* 101* 100* 99  BUN CREATININE 0.87 0.88 0.95 1.02  CALCIUM 8.7* 7.8* 8.0* 8.6*  MG  --  1.8  --   --    Liver Function Tests:  Recent Labs Lab 09/29/15 0055 09/29/15 0806  AST 47* 35  ALT 26 21  ALKPHOS 56 45  BILITOT 0.3 0.5  PROT 6.9 5.8*  ALBUMIN 3.3* 2.8*   CBC:  Recent Labs Lab 09/29/15 0055 09/29/15 0806 09/30/15 0540 10/01/15 0547  WBC 9.9 7.8 6.7 7.9  NEUTROABS 6.7 4.6  --   --   HGB 12.3* 10.8* 11.0* 12.5*  HCT 37.6* 33.6* 34.6* 39.4  MCV 93.5 93.9 95.3 94.3  PLT 192 176 173 219   Cardiac Enzymes:  Recent Labs Lab 09/29/15 0055 09/29/15 0806 09/29/15 1245 09/29/15 1904  CKTOTAL 1481* 990*  --   --   TROPONINI  --  <0.03 <0.03 <0.03    Recent Results (from the past 240 hour(s))  Wound culture     Status: None (Preliminary result)   Collection Time: 09/29/15 12:02 PM  Result Value Ref Range Status   Specimen Description WOUND RIGHT THIGH  Final   Special Requests NONE  Final   Gram Stain   Final    FEW WBC PRESENT,BOTH PMN AND MONONUCLEAR RARE SQUAMOUS EPITHELIAL CELLS PRESENT FEW GRAM POSITIVE COCCI IN PAIRS RARE GRAM NEGATIVE RODS Performed at  [facility]    Culture   Final    Culture reincubated for better growth Performed at [facility]    Report Status PENDING  Incomplete  MRSA PCR Screening     Status: None   Collection Time: 09/29/15  9:09 PM  Result Value Ref Range Status   MRSA by PCR NEGATIVE NEGATIVE Final    Comment:        The GeneXpert MRSA Assay (FDA approved for NASAL specimens only), is one component of a comprehensive MRSA colonization surveillance program. It is not intended to diagnose MRSA infection nor to guide or monitor treatment for MRSA infections.      Scheduled Meds: . antiseptic oral rinse  7 mL Mouth Rinse BID  . clotrimazole   Topical BID  . folic acid  1 mg Oral Daily  . LORazepam  0-4 mg Intravenous Q12H  . multivitamin with minerals  1 tablet Oral Daily  .  PHENobarbital  64.8 mg Oral QHS  . piperacillin-tazobactam (ZOSYN)  IV  3.375 g Intravenous 3 times per day  . thiamine  100 mg Oral Daily   Or  . thiamine  100 mg Intravenous Daily  . vancomycin  1,000 mg Intravenous Q12H   Continuous Infusions:

## 2015-10-02 ENCOUNTER — Other Ambulatory Visit: Payer: Self-pay | Admitting: *Deleted

## 2015-10-02 DIAGNOSIS — I739 Peripheral vascular disease, unspecified: Secondary | ICD-10-CM

## 2015-10-02 LAB — BASIC METABOLIC PANEL
ANION GAP: 7 (ref 5–15)
BUN: 14 mg/dL (ref 6–20)
CALCIUM: 8.4 mg/dL — AB (ref 8.9–10.3)
CO2: 25 mmol/L (ref 22–32)
CREATININE: 1.06 mg/dL (ref 0.61–1.24)
Chloride: 105 mmol/L (ref 101–111)
Glucose, Bld: 113 mg/dL — ABNORMAL HIGH (ref 65–99)
Potassium: 4.2 mmol/L (ref 3.5–5.1)
SODIUM: 137 mmol/L (ref 135–145)

## 2015-10-02 LAB — WOUND CULTURE

## 2015-10-02 LAB — CK: CK TOTAL: 214 U/L (ref 49–397)

## 2015-10-02 LAB — CBC
HEMATOCRIT: 40.1 % (ref 39.0–52.0)
Hemoglobin: 12.7 g/dL — ABNORMAL LOW (ref 13.0–17.0)
MCH: 30 pg (ref 26.0–34.0)
MCHC: 31.7 g/dL (ref 30.0–36.0)
MCV: 94.8 fL (ref 78.0–100.0)
Platelets: 217 10*3/uL (ref 150–400)
RBC: 4.23 MIL/uL (ref 4.22–5.81)
RDW: 14.9 % (ref 11.5–15.5)
WBC: 8.4 10*3/uL (ref 4.0–10.5)

## 2015-10-02 MED ORDER — HYDRALAZINE HCL 10 MG PO TABS
10.0000 mg | ORAL_TABLET | Freq: Three times a day (TID) | ORAL | Status: DC
  Administered 2015-10-02 – 2015-10-03 (×3): 10 mg via ORAL
  Filled 2015-10-02 (×3): qty 1

## 2015-10-02 NOTE — Progress Notes (Signed)
Physical Therapy Treatment Patient Details Name: Miguel DohenyRay J Heinle MRN: [id number] DOB: 10-02-1950 Today's Date: 10/02/2015    History of Present Illness 65 yo male admitted with R LE pain, rhabdomyolysis. Hx of CVA, HTN, DVT, Sz, A fib, PAD, ETOH abuse.     PT Comments    Pt initially refused OOB/ambulation on today. Able to get pt to agree to at least walk around room. Continues to c/o R LE pain.   Follow Up Recommendations  Home health PT;Supervision/Assistance - 24 hour vs SNF (depending on assist available )     Equipment Recommendations  Rolling walker with 5" wheels    Recommendations for Other Services       Precautions / Restrictions Precautions Precautions: Fall Restrictions Weight Bearing Restrictions: No    Mobility  Bed Mobility Overal bed mobility: Needs Assistance Bed Mobility: Supine to Sit;Sit to Supine     Supine to sit: Min assist;HOB elevated Sit to supine: Min guard   General bed mobility comments: Assist for R LE. Increased time.   Transfers Overall transfer level: Needs assistance Equipment used: Rolling walker (2 wheeled) Transfers: Sit to/from Stand Sit to Stand: From elevated surface;Min assist         General transfer comment: small amount of assist to stabilize while standing at EOB with RW. VCs safety, hand placement  Ambulation/Gait Ambulation/Gait assistance: Min guard Ambulation Distance (Feet): 15 Feet Assistive device: Rolling walker (2 wheeled) Gait Pattern/deviations: Step-to pattern;Trunk flexed     General Gait Details: short ambulation distance in room only. slow gait speed. Moderate reliance on walker for support.    Stairs            Wheelchair Mobility    Modified Rankin (Stroke Patients Only)       Balance Overall balance assessment: Needs assistance         Standing balance support: Bilateral upper extremity supported;During functional activity Standing balance-Leahy Scale: Poor                      Cognition Arousal/Alertness: Awake/alert Behavior During Therapy: WFL for tasks assessed/performed Overall Cognitive Status: Within Functional Limits for tasks assessed                      Exercises      General Comments        Pertinent Vitals/Pain Pain Assessment: Faces Faces Pain Scale: Hurts even more Pain Location: R thigh Pain Descriptors / Indicators: Sore Pain Intervention(s): Monitored during session;Repositioned;Limited activity within patient's tolerance    Home Living                      Prior Function            PT Goals (current goals can now be found in the care plan section) Progress towards PT goals: Progressing toward goals    Frequency  Min 3X/week    PT Plan Current plan remains appropriate    Co-evaluation             End of Session   Activity Tolerance: Patient limited by pain Patient left: in bed;with call bell/phone within reach     Time: [id number] PT Time Calculation (min) (ACUTE ONLY): 10 min  Charges:  $Gait Training: 8-22 mins                    G Codes:      Rebeca AlertJannie Yared Barefoot, MPT Pager: [phone]

## 2015-10-02 NOTE — Progress Notes (Cosign Needed)
CSW received consult for SNF placement. BSW Intern met with pt at bedside. BSW Intern introduced self and explained role. BSW Intern discussed PT recommendation for short-term rehab at a SNF. Pt states he has been to a facility in the past but can not recall the name. Pt declines SNF offer at this time. Pt states he lives at home with girlfriend and believes his girlfriend can help take care of him at home.   BSW Intern inquired if pt would be interested in [facility] services. Pt agreed. BSW Intern made RNCM, Cookie aware.  No further SW needs identified at this time.  BSW Intern signing off, Harlon Flor, [place] Work Department  [phone]

## 2015-10-02 NOTE — Care Management Important Message (Signed)
Important Message  Patient Details  Name: Miguel [name] MRN: [id number] Date of Birth: 24-Oct-1950   Medicare Important Message Given:  Yes    Haskell FlirtJamison, Kristinia Leavy 10/02/2015, 4:09 PMImportant Message  Patient Details  Name: Miguel [name] MRN: [id number] Date of Birth: 24-Oct-1950   Medicare Important Message Given:  Yes    Haskell FlirtJamison, Alva Broxson 10/02/2015, 4:08 PM

## 2015-10-02 NOTE — Progress Notes (Signed)
Pharmacy Antibiotic Note  Miguel [name] is a [age] y.o. male admitted on 09/28/2015 with cellulitis.  Pharmacy has been consulted for Vancomycin and Zosyn  dosing.  Plan: - wound culture growing few proteus resistant only to Ancef - Vanc d/c'd by Md - Continue current Zosyn dosing - plan per Md notes to change to PO cipro likely tomorrow  Height: 6' (182.9 cm) Weight: 160 lb (72.576 kg) IBW/kg (Calculated) : 77.6  Temp (24hrs), Avg:98.4 F (36.9 C), Min:98.1 F (36.7 C), Max:98.7 F (37.1 C)   Recent Labs Lab 09/29/15 0055 09/29/15 0532 09/29/15 0806 09/30/15 0540 10/01/15 0547 10/02/15 0459  WBC 9.9  --  7.8 6.7 7.9 8.4  CREATININE 0.87  --  0.88 0.95 1.02 1.06  LATICACIDVEN  --  0.86  --   --   --   --     Estimated Creatinine Clearance: 72.3 mL/min (by C-G formula based on Cr of 1.06).    No Known Allergies  Antimicrobials this admission: Vancomycin 3/3 >> 3/6 Zosyn 3/3 >>    Thank you for allowing pharmacy to be a part of this patient's care.   Miguel KnowsJustin M Nicolo [name], PharmD, BCPS Pager [phone] 10/02/2015 11:25 AM

## 2015-10-02 NOTE — Progress Notes (Signed)
Patient ID: Miguel [name], male   DOB: Apr 15, 1951, 65 y.o.   MRN: [id number]  [facility] PROGRESS NOTE  Miguel [name] WUJ:[id number] DOB: Apr 15, 1951 DOA: 09/28/2015 [facility]CP: [facility]   Brief narrative:    65 y.o. male with history of alcoholism, seizures, VP shunt, hypertension who has not been compliant with his medications, [facility] with main concern of progressive right lower extremity pain. Please note that pt was unable to provide much history due to acute illness.and ? Intoxication. [facility] asked to admit for further evaluation.   Assessment/Plan:    1. Lower extremity pain - appears to be underlying cellulitis, imposed on chronic peripheral arterial and venous insufficiency, ? Fungal infection, wound culture positive for Proteus, stop vanc and plan to narrow Zosyn to Cipro in next 24 hours, continue diflucan day #2 2. Rhabdomyolysis - continue with hydration, CK now WNL 3. Alcohol abuse - keep on CIWA protocol, no signs of withdrawal at this time  4. History of hypertension patient has been noncompliant with his medications - continue Hydralazine for now, due to cocaine use, Metoprolol was stopped  5. History of seizures - was recently changed to phenobarbital. Continue phenobarbital 6. Chronic anemia - follow CBC.  DVT prophylaxis - SCD's  Code Status: Full.  Family Communication:  plan of care discussed with the patient Disposition Plan: Home vs SNF by 3/7, SW consult for placement   IV access:  Peripheral IV  Procedures and diagnostic studies:    Ct Head Wo Contrast 09/29/2015 No acute intracranial pathology seen on CT. 2. Ventriculoperitoneal shunt noted ending at the right lateral ventricle. 3. Moderate cortical volume loss. 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 4. Chronic encephalomalacia at the temporal lobes bilaterally and left frontal lobe, reflecting  remote infarct. 5. Mucosal thickening at the maxillary sinuses bilaterally, and mild partial opacification of the frontal sinuses.  [place] Pelvis Limited 09/29/2015 Clustered dilated tortuous vessels in the right groin traveling inferiorly in the right inguinal canal and superiorly into the right lower quadrant of the abdomen. 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 2. No abnormal mass or fluid collection in the right groin.  Ct Femur Right W Contrast 09/29/2015  Relatively diffuse soft tissue edema tracking along the right thigh, particularly prominent laterally, and worsening about the level of the knee. More mild edema is also seen about the left thigh. Mild skin thickening suggested along the medial right thigh, raising question for cellulitis. 2. Clump of soft tissue density along the right inguinal canal is thought reflect a large right-sided varicocele. Would correlate for associated symptoms. 3. Prominent bilateral pelvic vasculature noted, particularly on the left, of uncertain significance. This may reflect underlying chronic venous obstruction or increased venous pressure.   Medical Consultants:  WOC Vascular surgery   Other Consultants:  PT  IAnti-Infectives:   Vancomycin 3/3 --> 3/6 Zosyn 3/3 --> Diflucan 3/6 -->  Debbora PrestoMAGICK-Bijou Easler, MD  [facility] Pager [phone]  If 7PM-7AM, please contact night-coverage [email or web address] Password [email or web address] 10/02/2015, 10:38 AM   LOS: 3 days   HPI/Subjective: No events overnight.   Objective: Filed Vitals:   10/01/15 [id number] 10/01/15 1453 10/01/15 2150 10/02/15 0541  BP:  152/80 169/98 156/78 162/98  Pulse: 79 74 88 80  Temp:   98.7 F (37.1 C) 98.1 F (36.7 C)  TempSrc:  Oral Oral Oral  Resp:  Height:       Weight:      SpO2:  100% 100% 100%    Intake/Output Summary (Last 24 hours) at 10/02/15 1038 Last data filed at 10/02/15 0800  Gross per 24 hour  Intake    880 ml  Output   3701 ml  Net  -2821 ml    Exam:   General:  Pt is alert, follows commands appropriately, not in acute distress  Cardiovascular: Regular rate and rhythm, no rubs, no gallops  Respiratory: Clear to auscultation bilaterally, no wheezing, no crackles, no rhonchi  Abdomen: Soft, non tender, non distended, bowel sounds present, no guarding  Extremities: RLE swelling and erythema with TTP, improved    Data Reviewed: Basic Metabolic Panel:  Recent Labs Lab 09/29/15 0055 09/29/15 0806 09/30/15 0540 10/01/15 0547 10/02/15 0459  NA 140 140 140 140 137  K 3.9 3.6 4.0 4.4 4.2  CL 105 109 108 104 105  CO2 GLUCOSE 146* 101* 100* 99 113*  BUN CREATININE 0.87 0.88 0.95 1.02 1.06  CALCIUM 8.7* 7.8* 8.0* 8.6* 8.4*  MG  --  1.8  --   --   --    Liver Function Tests:  Recent Labs Lab 09/29/15 0055 09/29/15 0806  AST 47* 35  ALT 26 21  ALKPHOS 56 45  BILITOT 0.3 0.5  PROT 6.9 5.8*  ALBUMIN 3.3* 2.8*   CBC:  Recent Labs Lab 09/29/15 0055 09/29/15 0806 09/30/15 0540 10/01/15 0547 10/02/15 0459  WBC 9.9 7.8 6.7 7.9 8.4  NEUTROABS 6.7 4.6  --   --   --   HGB 12.3* 10.8* 11.0* 12.5* 12.7*  HCT 37.6* 33.6* 34.6* 39.4 40.1  MCV 93.5 93.9 95.3 94.3 94.8  PLT 192 176 173 219 217   Cardiac Enzymes:  Recent Labs Lab 09/29/15 0055 09/29/15 0806 09/29/15 1245 09/29/15 1904 10/02/15 0459  CKTOTAL 1481* 990*  --   --  214  TROPONINI  --  <0.03 <0.03 <0.03  --     Recent Results (from the past 240 hour(s))  Wound culture     Status: None   Collection Time: 09/29/15 12:02 PM  Result Value Ref Range Status   Specimen Description WOUND RIGHT THIGH  Final   Special Requests NONE  Final   Gram Stain   Final    FEW WBC PRESENT,BOTH PMN AND MONONUCLEAR RARE  SQUAMOUS EPITHELIAL CELLS PRESENT FEW GRAM POSITIVE COCCI IN PAIRS RARE GRAM NEGATIVE RODS Performed at [facility]    Culture   Final    FEW PROTEUS MIRABILIS Note: IMRPPM Performed at [facility]    Report Status 10/02/2015 FINAL  Final   Organism ID, Bacteria PROTEUS MIRABILIS  Final      Susceptibility   Proteus mirabilis - MIC*    AMPICILLIN <=2 SENSITIVE Sensitive     AMPICILLIN/SULBACTAM <=2 SENSITIVE Sensitive     CEFAZOLIN 8 RESISTANT Resistant     CEFEPIME <=1 SENSITIVE Sensitive     CEFTAZIDIME <=1 SENSITIVE Sensitive     CEFTRIAXONE <=1 SENSITIVE Sensitive     CIPROFLOXACIN <=0.25 SENSITIVE Sensitive     GENTAMICIN <=1 SENSITIVE Sensitive     IMIPENEM 2 INTERMEDIATE Intermediate     PIP/TAZO <=  4 SENSITIVE Sensitive     TOBRAMYCIN <=1 SENSITIVE Sensitive     TRIMETH/SULFA Value in next row Sensitive      <=20 SENSITIVE(NOTE)    * FEW PROTEUS MIRABILIS  MRSA PCR Screening     Status: None   Collection Time: 09/29/15  9:09 PM  Result Value Ref Range Status   MRSA by PCR NEGATIVE NEGATIVE Final    Comment:        The GeneXpert MRSA Assay (FDA approved for NASAL specimens only), is one component of a comprehensive MRSA colonization surveillance program. It is not intended to diagnose MRSA infection nor to guide or monitor treatment for MRSA infections.      Scheduled Meds: . antiseptic oral rinse  7 mL Mouth Rinse BID  . clotrimazole   Topical BID  . fluconazole (DIFLUCAN) IV  100 mg Intravenous Q24H  . folic acid  1 mg Oral Daily  . LORazepam  0-4 mg Intravenous Q12H  . multivitamin with minerals  1 tablet Oral Daily  . PHENobarbital  64.8 mg Oral QHS  . piperacillin-tazobactam (ZOSYN)  IV  3.375 g Intravenous 3 times per day  . thiamine  100 mg Oral Daily   Or  . thiamine  100 mg Intravenous Daily  . vancomycin  1,000 mg Intravenous Q12H   Continuous Infusions:

## 2015-10-03 ENCOUNTER — Telehealth: Payer: Self-pay | Admitting: Surgery

## 2015-10-03 LAB — BASIC METABOLIC PANEL
ANION GAP: 9 (ref 5–15)
BUN: 19 mg/dL (ref 6–20)
CALCIUM: 8.9 mg/dL (ref 8.9–10.3)
CO2: 22 mmol/L (ref 22–32)
Chloride: 107 mmol/L (ref 101–111)
Creatinine, Ser: 1.19 mg/dL (ref 0.61–1.24)
Glucose, Bld: 105 mg/dL — ABNORMAL HIGH (ref 65–99)
Potassium: 4.5 mmol/L (ref 3.5–5.1)
SODIUM: 138 mmol/L (ref 135–145)

## 2015-10-03 LAB — CBC
HCT: 42.6 % (ref 39.0–52.0)
Hemoglobin: 13.7 g/dL (ref 13.0–17.0)
MCH: 30.4 pg (ref 26.0–34.0)
MCHC: 32.2 g/dL (ref 30.0–36.0)
MCV: 94.5 fL (ref 78.0–100.0)
PLATELETS: 235 10*3/uL (ref 150–400)
RBC: 4.51 MIL/uL (ref 4.22–5.81)
RDW: 14.9 % (ref 11.5–15.5)
WBC: 8 10*3/uL (ref 4.0–10.5)

## 2015-10-03 MED ORDER — CLOTRIMAZOLE 1 % EX CREA: TOPICAL_CREAM | Freq: Two times a day (BID) | CUTANEOUS | Status: DC

## 2015-10-03 MED ORDER — FOLIC ACID 1 MG PO TABS: 1.0000 mg | ORAL_TABLET | Freq: Every day | ORAL | Status: DC

## 2015-10-03 MED ORDER — SULFAMETHOXAZOLE-TRIMETHOPRIM 800-160 MG PO TABS: 1.0000 | ORAL_TABLET | Freq: Two times a day (BID) | ORAL | Status: DC

## 2015-10-03 MED ORDER — HYDRALAZINE HCL 25 MG PO TABS: 25.0000 mg | ORAL_TABLET | Freq: Three times a day (TID) | ORAL | Status: DC

## 2015-10-03 MED ORDER — FLUCONAZOLE 100 MG PO TABS: 100.0000 mg | ORAL_TABLET | Freq: Every day | ORAL | Status: DC

## 2015-10-03 MED ORDER — HYDRALAZINE HCL 25 MG PO TABS
25.0000 mg | ORAL_TABLET | Freq: Three times a day (TID) | ORAL | Status: DC
  Administered 2015-10-03: 25 mg via ORAL
  Filled 2015-10-03: qty 1

## 2015-10-03 MED ORDER — SULFAMETHOXAZOLE-TRIMETHOPRIM 800-160 MG PO TABS
1.0000 | ORAL_TABLET | Freq: Two times a day (BID) | ORAL | Status: DC
  Administered 2015-10-03: 1 via ORAL
  Filled 2015-10-03: qty 1

## 2015-10-03 NOTE — Discharge Summary (Signed)
Physician Discharge Summary  Miguel [name]:[id number] DOB: [birth year] DOA: 09/28/2015  [facility]: [facility]  Admit date: 09/28/2015 Discharge date: 10/03/2015  Recommendations for Outpatient Follow-up:  1. Pt will need to follow up with PCP in 2-3 weeks post discharge 2. Please obtain BMP to evaluate electrolytes and kidney function 3. Please also check CBC to evaluate Hg and Hct levels 4. Complete Bactrim therapy for 10 more days post discharge  5. Pt will need follow up with vascular surgery, please note ABI severely reduced bilaterally   Discharge Diagnoses:  Principal Problem:   Lower extremity pain Active Problems:   Seizures (HCC)   Hypertension   Alcohol abuse   S/P ventriculoperitoneal shunt   Rhabdomyolysis   Cellulitis  Discharge Condition: Stable  Diet recommendation: Heart healthy diet discussed in details    Brief narrative:    65 y.o. male with history of alcoholism, seizures, VP shunt, hypertension who has not been compliant with his medications, presented to [facility] ED with main concern of progressive right lower extremity pain. Please note that pt was unable to provide much history due to acute illness.and ? Intoxication. TRH asked to admit for further evaluation.   Assessment/Plan:    1. Lower extremity pain - appears to be underlying cellulitis, imposed on chronic peripheral arterial and venous insufficiency, ? Fungal infection, wound culture positive for Proteus, stopped vanc 3/6, stopped zosyn 3/7 and transition to oral Bactrim to complete therapy for 10 more days post discharge  2. Rhabdomyolysis - resolved  3. Alcohol abuse - no signs of withdrawal at this time  4. Essential hypertension patient has been noncompliant with his medications - continue Hydralazine for now, due to cocaine use, Metoprolol was stopped  5. History of seizures - was recently changed to phenobarbital. Continue phenobarbital 6. Chronic anemia - stable hg  DVT prophylaxis -  SCD's  Code Status: Full.  Family Communication: plan of care discussed with the patient Disposition Plan: SNF  IV access:  Peripheral IV  Procedures and diagnostic studies:   Ct Head Wo Contrast 09/29/2015 No acute intracranial pathology seen on CT. 2. Ventriculoperitoneal shunt noted ending at the right lateral ventricle. 3. Moderate cortical volume loss. 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 4. Chronic encephalomalacia at the temporal lobes bilaterally and left frontal lobe, reflecting remote infarct. 5. Mucosal thickening at the maxillary sinuses bilaterally, and mild partial opacification of the frontal sinuses.  US Pelvis Limited 09/29/2015 Clustered dilated tortuous vessels in the right groin traveling inferiorly in the right inguinal canal and superiorly into the right lower quadrant of the abdomen. 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 2. No abnormal mass or fluid collection in the right groin.  Ct Femur Right W Contrast 09/29/2015 Relatively diffuse soft tissue edema tracking along the right thigh, particularly prominent laterally, and worsening about the level of the knee. More mild edema is also seen about the left thigh. Mild skin thickening suggested along the medial right thigh, raising question for cellulitis. 2. Clump of soft tissue density along the right inguinal canal is thought reflect a large right-sided varicocele. Would correlate for associated symptoms. 3. Prominent bilateral pelvic vasculature noted, particularly on the left,  of uncertain significance. This may reflect underlying chronic venous obstruction or increased  venous pressure.   Medical Consultants:  [facility] Vascular surgery   Other Consultants:  PT  IAnti-Infectives:   Vancomycin 3/3 --> 3/6 Zosyn 3/3 --> 3/7 Diflucan 3/6 --> 7 more days Bactrim for 10 more days   Debbora Presto, MD [facility] Pager [phone]       Discharge Exam: Filed Vitals:   10/03/15 0501 10/03/15 0659  BP: 160/90 175/98  Pulse: 78   Temp: 98.2 F (36.8 C)   Resp: 18    Filed Vitals:   10/02/15 2041 10/02/15 2232 10/03/15 0501 10/03/15 0659  BP: 178/82 154/90 160/90 175/98  Pulse: 78  78   Temp: 97.9 F (36.6 C)  98.2 F (36.8 C)   TempSrc: Oral  Oral   Resp: 18  18   Height:      Weight:      SpO2: 100%  100%     General: Pt is alert, follows commands appropriately, not in acute distress Cardiovascular: Regular rate and rhythm, no rubs, no gallops Respiratory: Clear to auscultation bilaterally, no wheezing, no crackles, no rhonchi Abdominal: Soft, non tender, non distended, bowel sounds +, no guarding Extremities: RLE edema much improved   Discharge Instructions  Discharge Instructions    Diet - low sodium heart healthy    Complete by:  As directed      Increase activity slowly    Complete by:  As directed             Medication List    STOP taking these medications        metoprolol tartrate 25 MG tablet  Commonly known as:  LOPRESSOR      TAKE these medications        clotrimazole 1 % cream  Commonly known as:  LOTRIMIN  Apply topically 2 (two) times daily.     fluconazole 100 MG tablet  Commonly known as:  DIFLUCAN  Take 1 tablet (100 mg total) by mouth daily.     folic acid 1 MG tablet  Commonly known as:  FOLVITE  Take 1 tablet (1 mg total) by mouth daily.     hydrALAZINE 25 MG tablet  Commonly known as:  APRESOLINE  Take 1 tablet (25 mg total) by mouth every 8 (eight) hours.     PHENobarbital 64.8 MG tablet  Commonly known as:  LUMINAL  Take 1 tablet (64.8 mg total) by mouth at bedtime.      sulfamethoxazole-trimethoprim 800-160 MG tablet  Commonly known as:  BACTRIM DS,SEPTRA DS  Take 1 tablet by mouth every 12 (twelve) hours.     thiamine 100 MG tablet  Take 1 tablet (100 mg total) by mouth daily.           Follow-up Information    Follow up with [facility].   Specialty:  Internal Medicine   Contact information:   [address] [place] [address] [phone]        The results of significant diagnostics from this hospitalization (including imaging, microbiology, ancillary and laboratory) are listed below for reference.     Microbiology: Recent Results (from the past 240 hour(s))  Wound culture     Status: None   Collection Time: 09/29/15 12:02 PM  Result Value Ref Range Status   Specimen Description WOUND RIGHT THIGH  Final   Special Requests NONE  Final   Gram Stain   Final    FEW WBC PRESENT,BOTH PMN  AND MONONUCLEAR RARE SQUAMOUS EPITHELIAL CELLS PRESENT FEW GRAM POSITIVE COCCI IN PAIRS RARE GRAM NEGATIVE RODS Performed at [facility]    Culture   Final    FEW PROTEUS MIRABILIS Note: IMRPPM Performed at [facility]    Report Status 10/02/2015 FINAL  Final   Organism ID, Bacteria PROTEUS MIRABILIS  Final      Susceptibility   Proteus mirabilis - MIC*    AMPICILLIN <=2 SENSITIVE Sensitive     AMPICILLIN/SULBACTAM <=2 SENSITIVE Sensitive     CEFAZOLIN 8 RESISTANT Resistant     CEFEPIME <=1 SENSITIVE Sensitive     CEFTAZIDIME <=1 SENSITIVE Sensitive     CEFTRIAXONE <=1 SENSITIVE Sensitive     CIPROFLOXACIN <=0.25 SENSITIVE Sensitive     GENTAMICIN <=1 SENSITIVE Sensitive     IMIPENEM 2 INTERMEDIATE Intermediate     PIP/TAZO <=4 SENSITIVE Sensitive     TOBRAMYCIN <=1 SENSITIVE Sensitive     TRIMETH/SULFA Value in next row Sensitive      <=20 SENSITIVE(NOTE)    * FEW PROTEUS MIRABILIS  MRSA PCR Screening     Status: None   Collection Time: 09/29/15  9:09 PM  Result Value Ref Range Status   MRSA by PCR  NEGATIVE NEGATIVE Final     Labs: Basic Metabolic Panel:  Recent Labs Lab 09/29/15 0806 09/30/15 0540 10/01/15 0547 10/02/15 0459 10/03/15 0424  NA 140 140 140 137 138  K 3.6 4.0 4.4 4.2 4.5  CL 109 108 104 105 107  CO2 24 25 28 25 22   GLUCOSE 101* 100* 99 113* 105*  BUN 12 10 9 14 19   CREATININE 0.88 0.95 1.02 1.06 1.19  CALCIUM 7.8* 8.0* 8.6* 8.4* 8.9  MG 1.8  --   --   --   --    Liver Function Tests:  Recent Labs Lab 09/29/15 0055 09/29/15 0806  AST 47* 35  ALT 26 21  ALKPHOS 56 45  BILITOT 0.3 0.5  PROT 6.9 5.8*  ALBUMIN 3.3* 2.8*   CBC:  Recent Labs Lab 09/29/15 0055 09/29/15 0806 09/30/15 0540 10/01/15 0547 10/02/15 0459 10/03/15 0424  WBC 9.9 7.8 6.7 7.9 8.4 8.0  NEUTROABS 6.7 4.6  --   --   --   --   HGB 12.3* 10.8* 11.0* 12.5* 12.7* 13.7  HCT 37.6* 33.6* 34.6* 39.4 40.1 42.6  MCV 93.5 93.9 95.3 94.3 94.8 94.5  PLT 192 176 173 219 217 235   Cardiac Enzymes:  Recent Labs Lab 09/29/15 0055 09/29/15 0806 09/29/15 1245 09/29/15 1904 10/02/15 0459  CKTOTAL 1481* 990*  --   --  214  TROPONINI  --  <0.03 <0.03 <0.03  --      SIGNED: Time coordinating discharge:  30 minutes  MAGICK-Lemonte Al, MD  [facility] 10/03/2015, 10:31 AM Pager [phone]  If 7PM-7AM, please contact night-coverage [email or web address] Password [email or web address]

## 2015-10-03 NOTE — Plan of Care (Signed)
Problem: Skin Integrity: Goal: Skin integrity will improve Outcome: Adequate for Discharge Education provided regarding skin care, over all dry

## 2015-10-03 NOTE — Telephone Encounter (Signed)
-----   Message from Sharee PimpleMarilyn K McChesney, RN sent at 10/02/2015  9:26 AM EST ----- Regarding: schedule   ----- Message -----    From: Nada LibmanVance W Brabham, MD    Sent: 09/29/2015   7:30 PM      To: Vvs Charge Pool  09-29-2015:  Level 4 consult Schedule for LE duplex and ABI and f/u with Rosalita ChessmanSuzanne in 6 months

## 2015-10-03 NOTE — Progress Notes (Signed)
CSW was re-consulted by [facility].  RNCM reports that she is unable to contact pt significant other, Frenchie who pt reports that he can stay with and who can take care of him.  CSW met with pt at bedside who initially continued to decline SNF. Pt reports that Jeraldine Loots is at home and provided the address [address], [place] [address]. CSW contacted non-emergency police and request wellness check at address given. CSW received callback from [employer] stating that a Estelle Grumbles did not live at residence.   CSW reviewed chart and noted that pt has been to [facility] in the past. CSW spoke with Mendel Corning who provided phone number that was listed for Jeraldine Loots [phone]. CSW attempted phone number and phone number disconnected. Address that [facility] had on file for Frenchie was [address] CSW requested wellness check for that address. CSW received callback from [employer] that noone could be located at address.  CSW followed up with pt at bedside to notify pt that CSW is unable to locate [employer]. CSW inquired further about where pt was prior to admission and pt states that he was living in the streets prior to admission. Pt reports that the last time he has been in contact with Frenchie was two weeks ago. CSW discussed with pt concern about pt returning to the streets when he qualifies to go to a SNF for rehab under his insurance. Pt agreeable to SNF. Pt discussed that he remembers going to [facility] and is agreeable to go back to facility if [facility] able to accept.   CSW completed FL2 and sent clinical information to [facility]. CSW received notification from [facility] that facility could accept pt today.   CSW discussed with pt at bedside and pt is agreeable to [facility] today.   CSW facilitated pt discharge needs including contacting facility, faxing pt discharge information via epic hub, discussing with pt at bedside, providing RN phone number to call report, and RN plans  to arrange PTAR for pt pick up.   No further social work needs identified at this time.  CSW signing off.   Alison Murray, MSW, [place] Work [phone]

## 2015-10-03 NOTE — Progress Notes (Signed)
Report given to representative [facility], [phone], PTAR contacted for transport also.

## 2015-10-03 NOTE — Progress Notes (Signed)
Spoke with pt concerning HH.  Pt [place] me [place] the telephone number in EPIC was in correct.  Pt did not appear to be a safe discharge to home. Spoke with [place] CSW with these concerns. CSW will follow up on SNF.

## 2015-10-03 NOTE — Clinical Social Work Placement (Signed)
   CLINICAL SOCIAL WORK PLACEMENT  NOTE  Date:  10/03/2015  Patient Details  Name: Miguel [name] MRN: [id number] Date of Birth: 03-17-1951  Clinical Social Work is seeking post-discharge placement for this patient at the Skilled  Nursing Facility level of care (*CSW will initial, date and re-position this form in  chart as items are completed):  Yes   Patient/family provided with [facility] Clinical Social Work Department's list of facilities offering this level of care within the geographic area requested by the patient (or if unable, by the patient's family).  Yes   Patient/family informed of their freedom to choose among providers that offer the needed level of care, that participate in Medicare, Medicaid or managed care program needed by the patient, have an available bed and are willing to accept the patient.  Yes   Patient/family informed of [facility]'s ownership interest in [facility] and [facility], as well as of the fact that they are under no obligation to receive care at these facilities.  PASRR submitted to EDS on       PASRR number received on       Existing PASRR number confirmed on 10/03/15     FL2 transmitted to all facilities in geographic area requested by pt/family on 10/03/15     FL2 transmitted to all facilities within larger geographic area on       Patient informed that his/her managed care company has contracts with or will negotiate with certain facilities, including the following:        Yes   Patient/family informed of bed offers received.  [facility]     Physician recommends and patient chooses bed at      [facility].  Patient to be transferred to facility by ambulance Sharin Mons(PTAR)     Patient family notified on 10/03/15 of transfer.  Name of family member notified:  pt notified at bedside     PHYSICIAN Please sign FL2     Additional Comment:     _______________________________________________ Orson EvaKIDD, SUZANNA A, LCSW 10/03/2015, 3:04 PM

## 2015-10-03 NOTE — Progress Notes (Signed)
Pt discharged via [facility]. Pt stable with no complaints at this time.

## 2015-10-03 NOTE — NC FL2 (Signed)
[place] MEDICAID FL2 LEVEL OF CARE SCREENING TOOL     IDENTIFICATION  Patient Name: Miguel [name] Birthdate: 04-13-1951 Sex: male Admission Date (Current Location): 09/28/2015  [facility] [place]   Facility and Address:  [facility],  [address], [place] [id number]      Provider Number: [id number]  Attending Physician Name and Address:  Dorothea OgleIskra M Myers, MD  Relative Name and Phone Number:       Current Level of Care: Hospital Recommended Level of Care: Skilled Nursing Facility Prior Approval Number:    Date Approved/Denied:   PASRR Number: [id number]  Discharge Plan: SNF    Current Diagnoses: Patient Active Problem List   Diagnosis Date Noted  . Cellulitis 09/29/2015  . Lower extremity pain 09/29/2015  . Rhabdomyolysis 08/26/2015  . Seizure disorder (HCC) 10/18/2014  . Hypothermia 09/07/2014  . Acute renal failure (ARF) (HCC) 09/07/2014  . Metabolic acidosis 09/07/2014  . Acute encephalopathy 09/07/2014  . Homelessness 09/07/2014  . Dilantin level too low 09/07/2014  . Leukocytosis 09/07/2014  . Sepsis (HCC) 09/07/2014  . 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 Alcohol abuse 02/25/2013  . Dilantin toxicity 02/25/2013  . Tobacco abuse 02/25/2013  . Noncompliance 02/25/2013  . Seizures (HCC)   . Hypertension     Orientation RESPIRATION BLADDER Height & Weight     Self, Time, Situation, Place  Normal Continent Weight: 160 lb (72.576 kg) Height:  6' (182.9 cm)  BEHAVIORAL SYMPTOMS/MOOD NEUROLOGICAL BOWEL NUTRITION STATUS   (no behaviors) Convulsions/Seizures (hx of seizures) Incontinent Diet (Heart healthy diet )  AMBULATORY STATUS COMMUNICATION OF NEEDS Skin   Limited Assist Verbally Normal                       Personal Care Assistance Level of Assistance  Bathing, Feeding, Dressing Bathing Assistance: Limited assistance Feeding assistance: Independent Dressing Assistance: Limited assistance      Functional Limitations Info  Sight, Hearing, Speech Sight Info: Adequate Hearing Info: Impaired Speech Info: Adequate    SPECIAL CARE FACTORS FREQUENCY  PT (By licensed PT), OT (By licensed OT)     PT Frequency: 5 x a week OT Frequency: 5 x a week            Contractures Contractures Info: Not present    Additional Factors Info  Code Status, Allergies Code Status Info: FULL code status Allergies Info: No Known Allergies           Current Medications (10/03/2015):  This is the current hospital active medication list Current Facility-Administered Medications  Medication Dose Route Frequency Provider Last Rate Last Dose  . acetaminophen (TYLENOL) tablet 650 mg  650 mg Oral Q6H PRN Eduard ClosArshad N Kakrakandy, MD       Or  . acetaminophen (TYLENOL) suppository 650 mg  650 mg Rectal Q6H PRN Eduard ClosArshad N Kakrakandy, MD      . antiseptic oral rinse (CPC / CETYLPYRIDINIUM CHLORIDE 0.05%) solution 7 mL  7 mL Mouth Rinse BID Eduard ClosArshad N Kakrakandy, MD   7 mL at 10/03/15 1000  . clotrimazole (LOTRIMIN) 1 % cream   Topical BID Dorothea OgleIskra M Myers, MD      . fluconazole (DIFLUCAN) IVPB 100 mg  100 mg Intravenous Q24H Dorothea OgleIskra M Myers, MD   100 mg at 10/02/15 1346  . folic acid (FOLVITE) tablet 1 mg  1 mg Oral Daily Eduard ClosArshad N Kakrakandy, MD   1 mg at 10/03/15 1100  . hydrALAZINE (APRESOLINE) injection  5 mg  5 mg Intravenous Q4H PRN Dorothea Ogle, MD   5 mg at 10/03/15 0700  . hydrALAZINE (APRESOLINE) tablet 25 mg  25 mg Oral 3 times per day Dorothea Ogle, MD      . morphine 2 MG/ML injection 1 mg  1 mg Intravenous Q4H PRN Eduard Clos, MD   1 mg at 09/29/15 2353  . multivitamin with minerals tablet 1 tablet  1 tablet Oral Daily Eduard Clos, MD   1 tablet at 10/03/15 1100  . ondansetron (ZOFRAN) tablet 4 mg  4 mg Oral Q6H PRN Eduard Clos, MD       Or  . ondansetron [facility]) injection 4 mg  4 mg Intravenous Q6H PRN Eduard Clos, MD      . PHENobarbital (LUMINAL) tablet 64.8 mg   64.8 mg Oral QHS Eduard Clos, MD   64.8 mg at 10/02/15 2234  . sulfamethoxazole-trimethoprim (BACTRIM DS,SEPTRA DS) 800-160 MG per tablet 1 tablet  1 tablet Oral Q12H Dorothea Ogle, MD      . thiamine (VITAMIN B-1) tablet 100 mg  100 mg Oral Daily Eduard Clos, MD   100 mg at 10/03/15 1100   Or  . thiamine (B-1) injection 100 mg  100 mg Intravenous Daily Eduard Clos, MD         Discharge Medications: Please see discharge summary for a list of discharge medications.  Relevant Imaging Results:  Relevant Lab Results:   Additional Information SSN: [id number]  Adriyana Greenbaum, Selena Lesser A, LCSW

## 2015-10-03 NOTE — Discharge Instructions (Signed)
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
## 2015-11-10 ENCOUNTER — Encounter (HOSPITAL_COMMUNITY): Payer: Self-pay | Admitting: Emergency Medicine

## 2015-11-10 ENCOUNTER — Emergency Department (HOSPITAL_COMMUNITY)
Admission: EM | Admit: 2015-11-10 | Discharge: 2015-11-10 | Disposition: A | Discharge status: Home / Self Care | Payer: Medicare Other | Attending: Emergency Medicine | Admitting: Emergency Medicine

## 2015-11-10 DIAGNOSIS — Z792 Long term (current) use of antibiotics: Secondary | ICD-10-CM | POA: Diagnosis not present

## 2015-11-10 DIAGNOSIS — F17210 Nicotine dependence, cigarettes, uncomplicated: Secondary | ICD-10-CM | POA: Diagnosis not present

## 2015-11-10 DIAGNOSIS — Z87828 Personal history of other (healed) physical injury and trauma: Secondary | ICD-10-CM | POA: Diagnosis not present

## 2015-11-10 DIAGNOSIS — Z79899 Other long term (current) drug therapy: Secondary | ICD-10-CM | POA: Insufficient documentation

## 2015-11-10 DIAGNOSIS — R4182 Altered mental status, unspecified: Secondary | ICD-10-CM | POA: Insufficient documentation

## 2015-11-10 DIAGNOSIS — R011 Cardiac murmur, unspecified: Secondary | ICD-10-CM | POA: Insufficient documentation

## 2015-11-10 DIAGNOSIS — Z8673 Personal history of transient ischemic attack (TIA), and cerebral infarction without residual deficits: Secondary | ICD-10-CM | POA: Diagnosis not present

## 2015-11-10 DIAGNOSIS — F1010 Alcohol abuse, uncomplicated: Secondary | ICD-10-CM

## 2015-11-10 DIAGNOSIS — IMO0002 Reserved for concepts with insufficient information to code with codable children: Secondary | ICD-10-CM

## 2015-11-10 DIAGNOSIS — Z8669 Personal history of other diseases of the nervous system and sense organs: Secondary | ICD-10-CM | POA: Diagnosis not present

## 2015-11-10 DIAGNOSIS — I10 Essential (primary) hypertension: Secondary | ICD-10-CM | POA: Insufficient documentation

## 2015-11-10 DIAGNOSIS — R451 Restlessness and agitation: Secondary | ICD-10-CM | POA: Insufficient documentation

## 2015-11-10 DIAGNOSIS — F10129 Alcohol abuse with intoxication, unspecified: Secondary | ICD-10-CM | POA: Insufficient documentation

## 2015-11-10 LAB — CBC WITH DIFFERENTIAL/PLATELET
BASOS PCT: 1 %
Basophils Absolute: 0.1 10*3/uL (ref 0.0–0.1)
EOS PCT: 4 %
Eosinophils Absolute: 0.3 10*3/uL (ref 0.0–0.7)
HCT: 35.2 % — ABNORMAL LOW (ref 39.0–52.0)
HEMOGLOBIN: 11.6 g/dL — AB (ref 13.0–17.0)
Lymphocytes Relative: 43 %
Lymphs Abs: 2.8 10*3/uL (ref 0.7–4.0)
MCH: 30.2 pg (ref 26.0–34.0)
MCHC: 33 g/dL (ref 30.0–36.0)
MCV: 91.7 fL (ref 78.0–100.0)
MONOS PCT: 14 %
Monocytes Absolute: 1 10*3/uL (ref 0.1–1.0)
NEUTROS PCT: 38 %
Neutro Abs: 2.6 10*3/uL (ref 1.7–7.7)
Platelets: 158 10*3/uL (ref 150–400)
RBC: 3.84 MIL/uL — AB (ref 4.22–5.81)
RDW: 15.1 % (ref 11.5–15.5)
WBC: 6.8 10*3/uL (ref 4.0–10.5)

## 2015-11-10 LAB — COMPREHENSIVE METABOLIC PANEL
ALK PHOS: 48 U/L (ref 38–126)
ALT: 12 U/L — ABNORMAL LOW (ref 17–63)
ANION GAP: 9 (ref 5–15)
AST: 15 U/L (ref 15–41)
Albumin: 3.7 g/dL (ref 3.5–5.0)
BUN: 13 mg/dL (ref 6–20)
CALCIUM: 8.1 mg/dL — AB (ref 8.9–10.3)
CO2: 20 mmol/L — AB (ref 22–32)
Chloride: 114 mmol/L — ABNORMAL HIGH (ref 101–111)
Creatinine, Ser: 1.17 mg/dL (ref 0.61–1.24)
GFR calc non Af Amer: >60 mL/min (ref >60)
Glucose, Bld: 94 mg/dL (ref 65–99)
Potassium: 3.8 mmol/L (ref 3.5–5.1)
SODIUM: 143 mmol/L (ref 135–145)
TOTAL PROTEIN: 6.5 g/dL (ref 6.5–8.1)
Total Bilirubin: 0.7 mg/dL (ref 0.3–1.2)

## 2015-11-10 LAB — CK: Total CK: 145 U/L (ref 49–397)

## 2015-11-10 LAB — ETHANOL: Alcohol, Ethyl (B): 243 mg/dL — ABNORMAL HIGH (ref <5)

## 2015-11-10 MED ORDER — SODIUM CHLORIDE 0.9 % IV BOLUS (SEPSIS)
1000.0000 mL | Freq: Once | INTRAVENOUS | Status: AC
  Administered 2015-11-10: 1000 mL via INTRAVENOUS

## 2015-11-10 MED ORDER — NALOXONE HCL 0.4 MG/ML IJ SOLN
0.4000 mg | Freq: Once | INTRAMUSCULAR | Status: AC
  Administered 2015-11-10: 0.4 mg via INTRAVENOUS
  Filled 2015-11-10: qty 1
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
## 2015-11-10 NOTE — ED Notes (Signed)
PA at bedside to examine pt and pt became startled and pulled IV out after receiving NS 1 liter via EMS.
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
## 2015-11-10 NOTE — ED Notes (Signed)
Pt left AMA °

## 2016-01-18 ENCOUNTER — Emergency Department (HOSPITAL_COMMUNITY)
Admission: EM | Admit: 2016-01-18 | Discharge: 2016-01-18 | Disposition: A | Discharge status: Home / Self Care | Payer: Medicare Other | Attending: Emergency Medicine | Admitting: Emergency Medicine

## 2016-01-18 ENCOUNTER — Encounter (HOSPITAL_COMMUNITY): Payer: Self-pay | Admitting: Emergency Medicine

## 2016-01-18 DIAGNOSIS — F17210 Nicotine dependence, cigarettes, uncomplicated: Secondary | ICD-10-CM | POA: Diagnosis not present

## 2016-01-18 DIAGNOSIS — I10 Essential (primary) hypertension: Secondary | ICD-10-CM | POA: Insufficient documentation

## 2016-01-18 DIAGNOSIS — F10229 Alcohol dependence with intoxication, unspecified: Secondary | ICD-10-CM | POA: Diagnosis present

## 2016-01-18 DIAGNOSIS — Z8673 Personal history of transient ischemic attack (TIA), and cerebral infarction without residual deficits: Secondary | ICD-10-CM | POA: Diagnosis not present

## 2016-01-18 DIAGNOSIS — Z79899 Other long term (current) drug therapy: Secondary | ICD-10-CM | POA: Insufficient documentation

## 2016-01-18 DIAGNOSIS — I4891 Unspecified atrial fibrillation: Secondary | ICD-10-CM | POA: Diagnosis not present

## 2016-01-18 DIAGNOSIS — F10920 Alcohol use, unspecified with intoxication, uncomplicated: Secondary | ICD-10-CM
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
## 2016-01-18 NOTE — ED Notes (Signed)
Pt called out to and asked him to sit up so that he could eat. Pt responded by saying "Ok ok yes, I am hungry. Meal tray and fluids provided.

## 2016-01-18 NOTE — ED Notes (Signed)
Bed: WHALA Expected date:  Expected time:  Means of arrival:  Comments: Ems- ETOH 
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
## 2016-01-18 NOTE — ED Notes (Signed)
No trauma or injury seen. Pt resting in bed with eyes closed. E/u resp NAD. All belongings at bedside.

## 2016-01-18 NOTE — Discharge Instructions (Signed)
°  Avoid all forms of alcohol.  Alcohol Intoxication Alcohol intoxication occurs when you drink enough alcohol that it affects your ability to function. It can be mild or very severe. Drinking a lot of alcohol in a short time is called binge drinking. This can be very harmful. Drinking alcohol can also be more dangerous if you are taking medicines or other drugs. Some of the effects caused by alcohol may include:  Loss of coordination.  Changes in mood and behavior.  Unclear thinking.  Trouble talking (slurred speech).  Throwing up (vomiting).  Confusion.  Slowed breathing.  Twitching and shaking (seizures).  Loss of consciousness. HOME CARE  Do not drive after drinking alcohol.  Drink enough water and fluids to keep your pee (urine) clear or pale yellow. Avoid caffeine.  Only take medicine as told by your doctor. GET HELP IF:  You throw up (vomit) many times.  You do not feel better after a few days.  You frequently have alcohol intoxication. Your doctor can help decide if you should see a substance use treatment counselor. GET HELP RIGHT AWAY IF:  You become shaky when you stop drinking.  You have twitching and shaking.  You throw up blood. It may look bright red or like coffee grounds.  You notice blood in your poop (bowel movements).  You become lightheaded or pass out (faint). MAKE SURE YOU:   Understand these instructions.  Will watch your condition.  Will get help right away if you are not doing well or get worse.   This information is not intended to replace advice given to you by your health care provider. Make sure you discuss any questions you have with your health care provider.   Document Released: 01/01/2008 Document Revised: 03/17/2013 Document Reviewed: 12/18/2012 Elsevier Interactive Patient Education [employer]2016 [employer].

## 2016-03-26 ENCOUNTER — Encounter: Payer: Self-pay | Admitting: Family

## 2016-03-30 ENCOUNTER — Encounter (HOSPITAL_COMMUNITY): Payer: Self-pay | Admitting: Nurse Practitioner

## 2016-03-30 ENCOUNTER — Emergency Department (HOSPITAL_COMMUNITY)
Admission: EM | Admit: 2016-03-30 | Discharge: 2016-03-31 | Disposition: A | Discharge status: Home / Self Care | Payer: Medicare Other | Attending: Emergency Medicine | Admitting: Emergency Medicine

## 2016-03-30 ENCOUNTER — Emergency Department (HOSPITAL_COMMUNITY): Payer: Medicare Other

## 2016-03-30 DIAGNOSIS — F10120 Alcohol abuse with intoxication, uncomplicated: Secondary | ICD-10-CM | POA: Insufficient documentation

## 2016-03-30 DIAGNOSIS — F17210 Nicotine dependence, cigarettes, uncomplicated: Secondary | ICD-10-CM | POA: Insufficient documentation

## 2016-03-30 DIAGNOSIS — Z79899 Other long term (current) drug therapy: Secondary | ICD-10-CM | POA: Diagnosis not present

## 2016-03-30 DIAGNOSIS — I10 Essential (primary) hypertension: Secondary | ICD-10-CM | POA: Diagnosis not present

## 2016-03-30 DIAGNOSIS — F10920 Alcohol use, unspecified with intoxication, uncomplicated: Secondary | ICD-10-CM

## 2016-03-30 HISTORY — DX: Alcohol abuse, uncomplicated: F10.10

## 2016-03-30 LAB — CBC WITH DIFFERENTIAL/PLATELET
Basophils Absolute: 0.1 10*3/uL (ref 0.0–0.1)
Basophils Relative: 1 %
Eosinophils Absolute: 0.4 10*3/uL (ref 0.0–0.7)
Eosinophils Relative: 5 %
HCT: 42.2 % (ref 39.0–52.0)
HEMOGLOBIN: 13.7 g/dL (ref 13.0–17.0)
LYMPHS ABS: 2.7 10*3/uL (ref 0.7–4.0)
LYMPHS PCT: 34 %
MCH: 30.1 pg (ref 26.0–34.0)
MCHC: 32.5 g/dL (ref 30.0–36.0)
MCV: 92.7 fL (ref 78.0–100.0)
Monocytes Absolute: 0.9 10*3/uL (ref 0.1–1.0)
Monocytes Relative: 11 %
NEUTROS ABS: 3.9 10*3/uL (ref 1.7–7.7)
NEUTROS PCT: 49 %
Platelets: 158 10*3/uL (ref 150–400)
RBC: 4.55 MIL/uL (ref 4.22–5.81)
RDW: 15.8 % — ABNORMAL HIGH (ref 11.5–15.5)
WBC: 7.9 10*3/uL (ref 4.0–10.5)

## 2016-03-30 LAB — COMPREHENSIVE METABOLIC PANEL
ALK PHOS: 49 U/L (ref 38–126)
ALT: 14 U/L — AB (ref 17–63)
AST: 19 U/L (ref 15–41)
Albumin: 4 g/dL (ref 3.5–5.0)
Anion gap: 10 (ref 5–15)
BUN: 16 mg/dL (ref 6–20)
CALCIUM: 8.8 mg/dL — AB (ref 8.9–10.3)
CO2: 22 mmol/L (ref 22–32)
CREATININE: 1.25 mg/dL — AB (ref 0.61–1.24)
Chloride: 108 mmol/L (ref 101–111)
GFR calc non Af Amer: 59 mL/min — ABNORMAL LOW (ref >60)
Glucose, Bld: 91 mg/dL (ref 65–99)
Potassium: 3.7 mmol/L (ref 3.5–5.1)
SODIUM: 140 mmol/L (ref 135–145)
Total Bilirubin: 0.7 mg/dL (ref 0.3–1.2)
Total Protein: 7.4 g/dL (ref 6.5–8.1)

## 2016-03-30 LAB — CBG MONITORING, ED: Glucose-Capillary: 87 mg/dL (ref 65–99)

## 2016-03-30 LAB — ETHANOL: Alcohol, Ethyl (B): 327 mg/dL (ref <5)

## 2016-03-30 MED ORDER — ALBUTEROL SULFATE (2.5 MG/3ML) 0.083% IN NEBU
5.0000 mg | INHALATION_SOLUTION | Freq: Once | RESPIRATORY_TRACT | Status: AC
  Administered 2016-03-30: 5 mg via RESPIRATORY_TRACT
  Filled 2016-03-30: qty 6

## 2016-03-30 MED ORDER — STERILE WATER FOR INJECTION IJ SOLN
INTRAMUSCULAR | Status: AC
  Administered 2016-03-30: 1 mL
  Filled 2016-03-30: qty 10

## 2016-03-30 MED ORDER — ZIPRASIDONE MESYLATE 20 MG IM SOLR
10.0000 mg | Freq: Once | INTRAMUSCULAR | Status: AC
  Administered 2016-03-30: 10 mg via INTRAMUSCULAR
  Filled 2016-03-30: qty 20

## 2016-03-30 MED ORDER — SODIUM CHLORIDE 0.9 % IV BOLUS (SEPSIS): 1000.0000 mL | Freq: Once | INTRAVENOUS | Status: DC

## 2016-03-30 NOTE — ED Triage Notes (Signed)
Pt's chart opened in error.
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
## 2016-03-30 NOTE — ED Notes (Signed)
Pt out of bed 20+x. Refusing help and refusing to stay in bed.

## 2016-03-30 NOTE — ED Notes (Signed)
Pt. Went into room 25, picked up another pt.'s belongings, reoriented , redirected, uncooperative. Refused to be back to his bed, Security called. Resisted assistance from Staff.  Peed on the floor and in bed.
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
## 2016-03-30 NOTE — ED Notes (Signed)
Pt. Uncooperative, attempted to start IV twice unsuccessful. Confused, restless, wandering to another pt.'s rooms. To notify MD.

## 2016-03-30 NOTE — ED Notes (Signed)
Pt responds well to "Mr. Carston". Wandered into patient rooms multiple times. Wandered with unsteady / high risk gait. Will swing if agitated and handled aggressively. Continues to try and stand (unsteadily).
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
## 2016-03-30 NOTE — ED Notes (Signed)
Pt. Still restless, uncooperative, refused to be assisted in walking. unsteaday gait still noted. MD aware.

## 2016-03-31 NOTE — ED Notes (Signed)
Patient unable to take steps without leaning and stumbling. Patient advised to stay in bed.

## 2016-03-31 NOTE — ED Notes (Signed)
Pt. Had been redirected back to bed 4 times. Just now Clinical research associatewriter assisted pt. To the bathroom. Pt. Has a  very unsteady gait. Refused help. With a stern but calm tone pt was redirected back to bed with assistance.

## 2016-03-31 NOTE — ED Notes (Signed)
Pt ambulated down hallway independently.

## 2016-04-05 ENCOUNTER — Encounter (HOSPITAL_COMMUNITY): Payer: Medicare Other

## 2016-04-05 ENCOUNTER — Ambulatory Visit: Payer: Medicare Other | Admitting: Family

## 2016-04-20 IMAGING — DX DG CHEST 1V PORT
1 series · 1 of 1 positions shown · non-contrast
Comparison: Chest radiograph dated 09/07/2014 and 01/22/2013

CLINICAL DATA: 63-year-old male with hypotension found on the
ground.

EXAM:
PORTABLE CHEST - 1 VIEW

[chest ap]
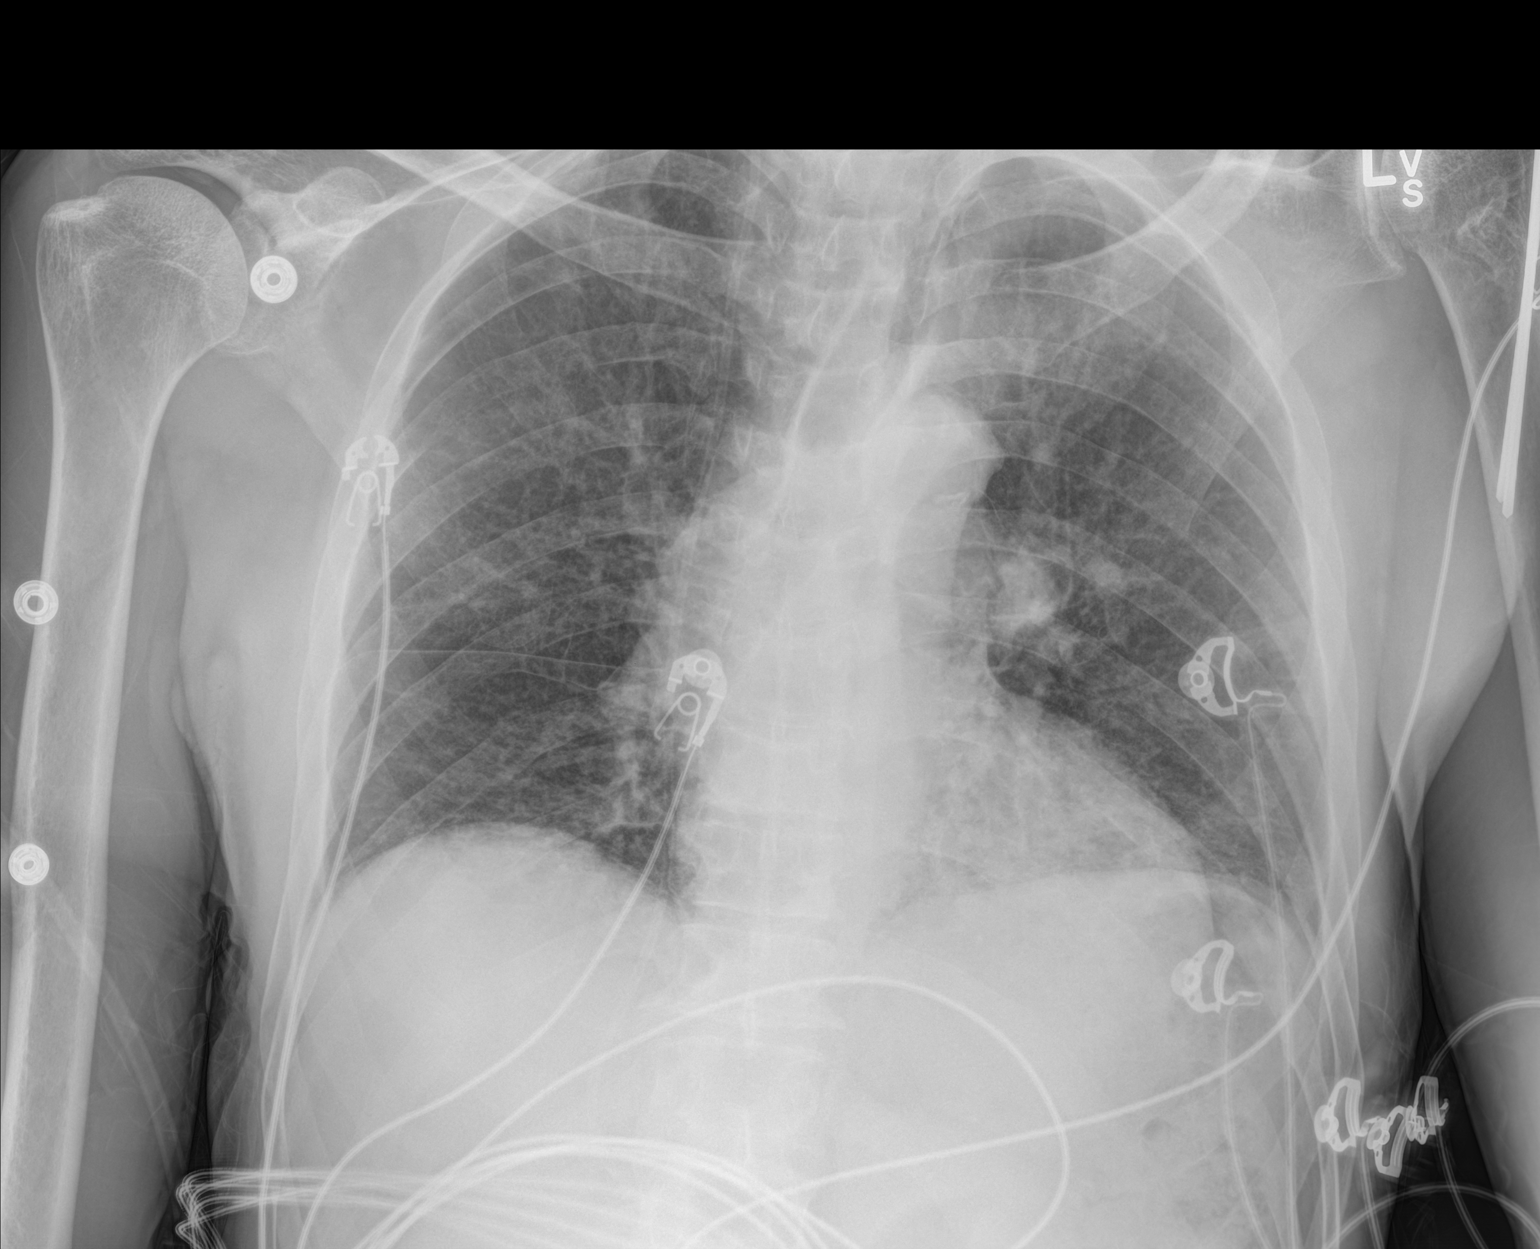

[1 of 1 positions shown; findings below may reference images not displayed]

FINDINGS: Single-view of the chest demonstrate emphysematous changes of the
lungs. No focal consolidation, pleural effusion, or pneumothorax.
Grossly stable appearing nodular density at the left hilum likely
represent vascular confluence or possible calcified lymph node.
Stable cardiac silhouette. Eight VP shunt is partially visualized.
The osseous structures are grossly unremarkable.
IMPRESSION: No active disease.

## 2016-07-22 IMAGING — CT CT HEAD W/O CM
2 series · 17 of 30 positions shown, 20 images · non-contrast
Comparison: 05/24/2015

CLINICAL DATA: Intoxication and recent fall

EXAM:
CT HEAD WITHOUT CONTRAST
TECHNIQUE: Contiguous axial images were obtained from the base of the skull
through the vertex without intravenous contrast.

[Series 2: head w/o · axial · non-contrast · 0.41mm/px · z∈[-51,+69]mm · 9 of 32 slices shown, 12 images]
[im 4/32  brain]
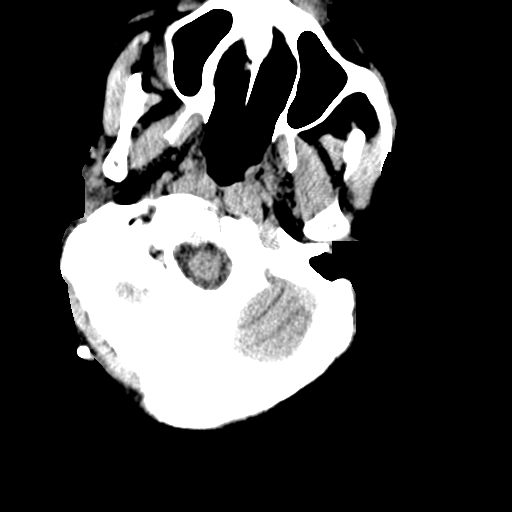
[im 4/32  bone]
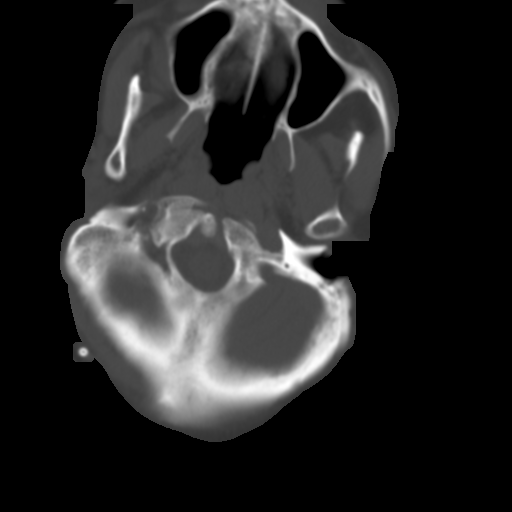
[im 7/32  brain]
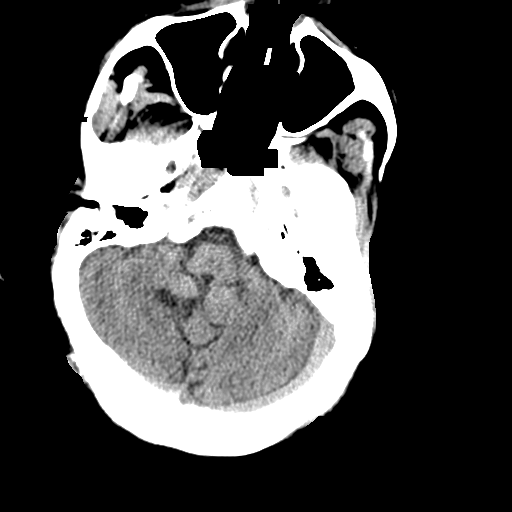
[im 10/32  brain]
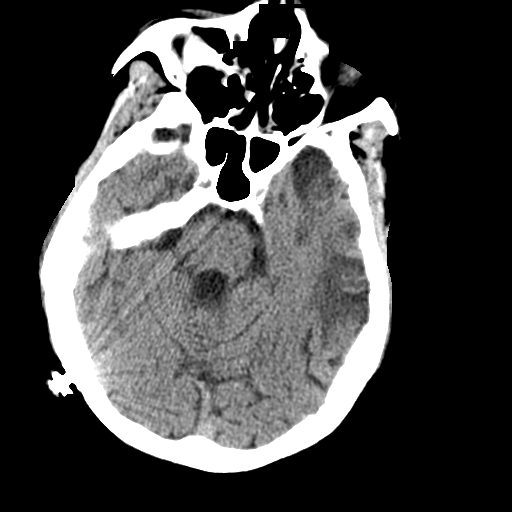
[im 13/32  brain]
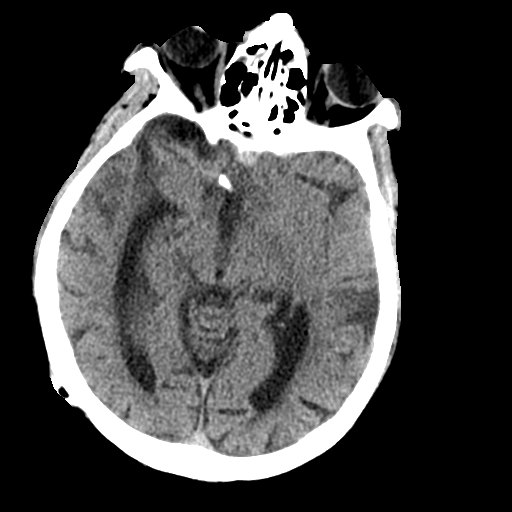
[im 16/32  brain]
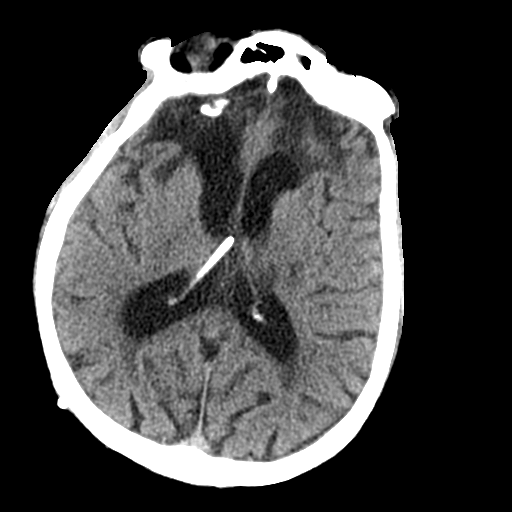
[im 16/32  bone]
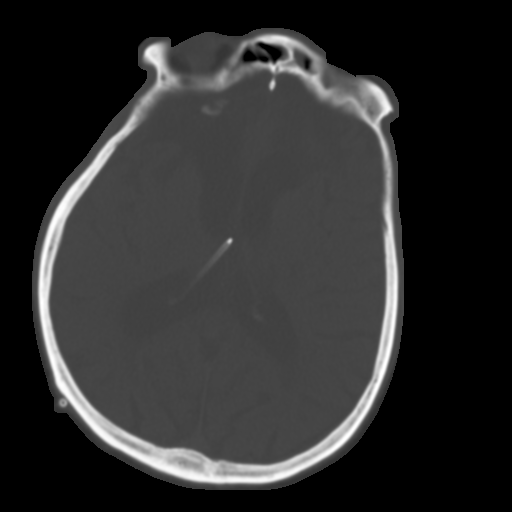
[im 19/32  brain]
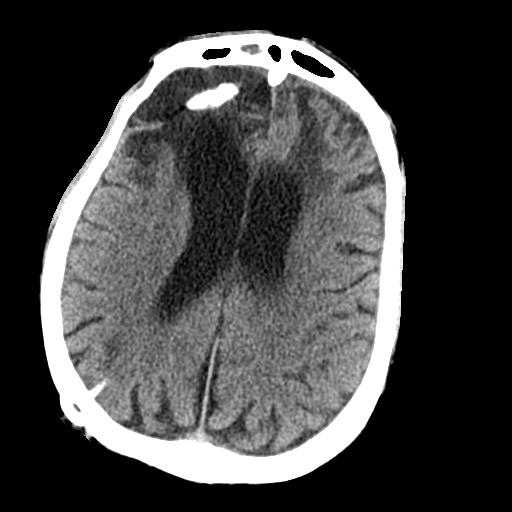
[im 22/32  brain]
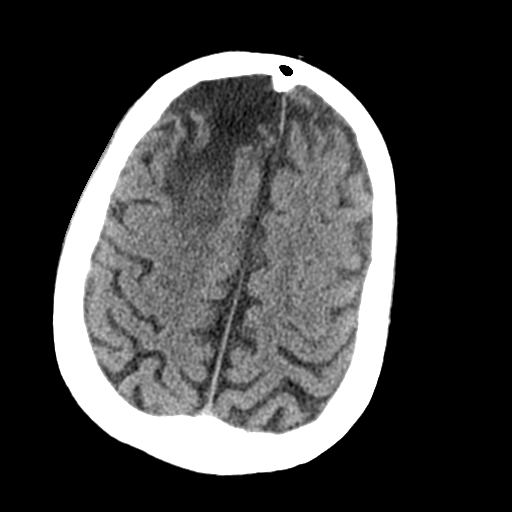
[im 25/32  brain]
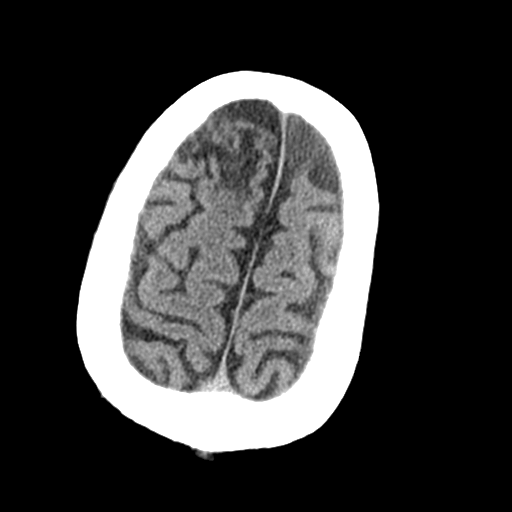
[im 28/32  brain]
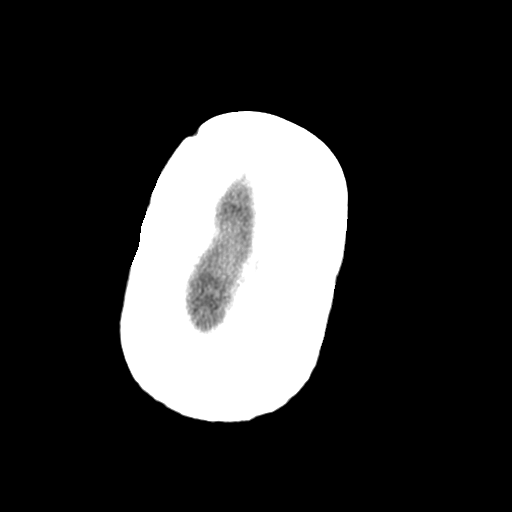
[im 28/32  bone]
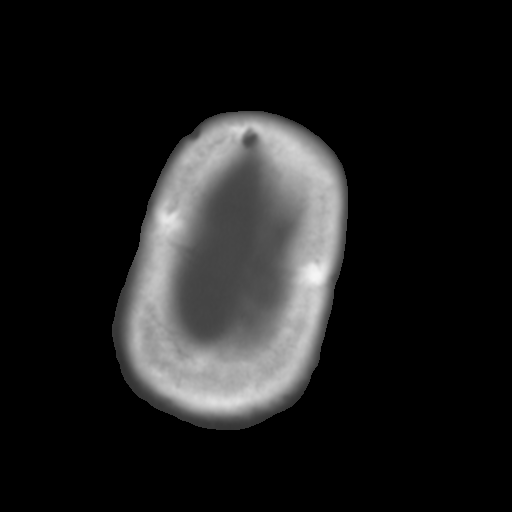

[Series 3: bone windows · axial · 0.41mm/px · z∈[-51,+75]mm · 8 of 54 slices shown]
[im 6/54  bone]
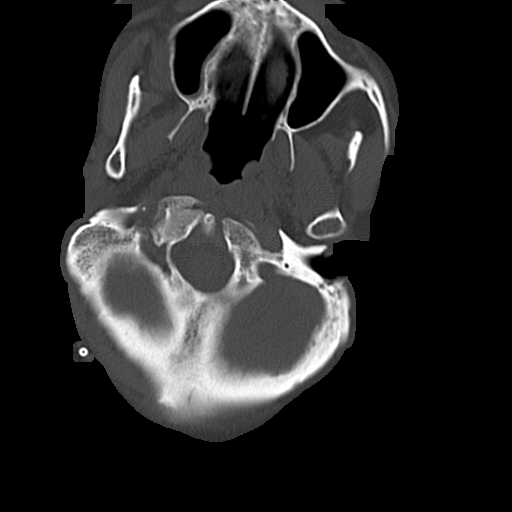
[im 12/54  bone]
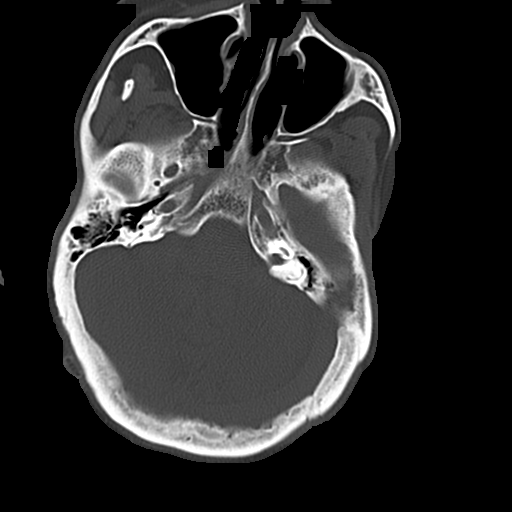
[im 18/54  bone]
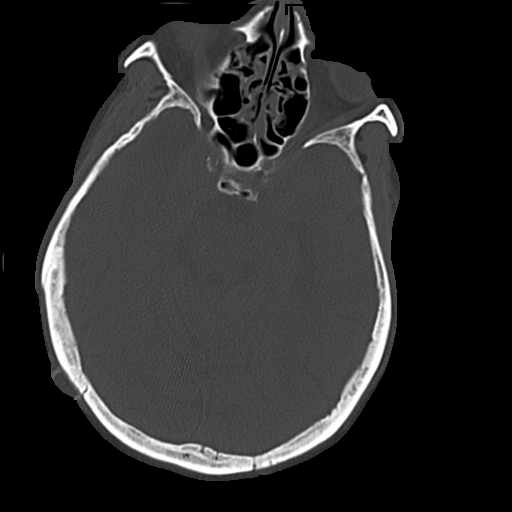
[im 24/54  bone]
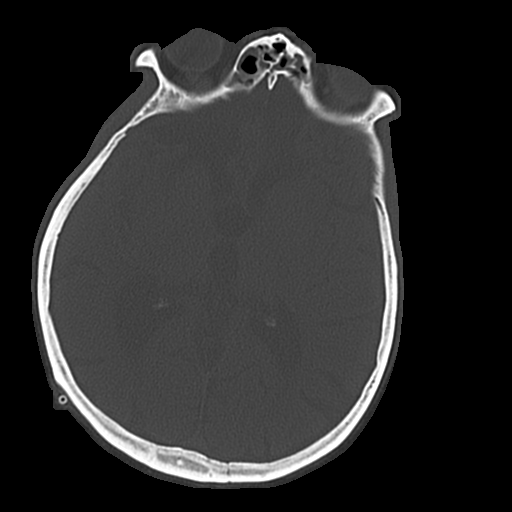
[im 30/54  bone]
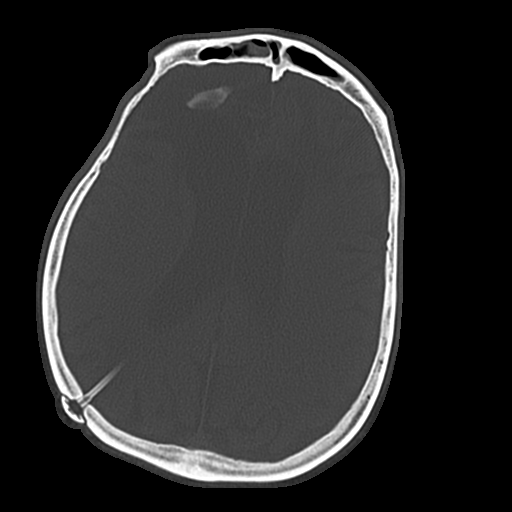
[im 36/54  bone]
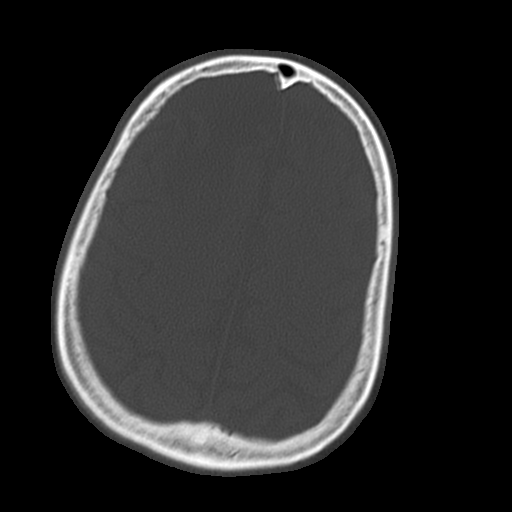
[im 42/54  bone]
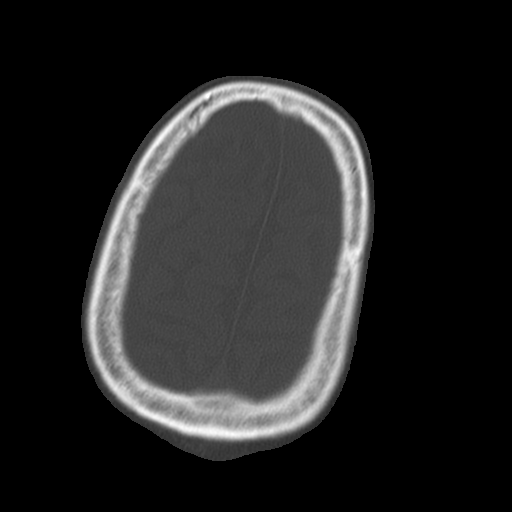
[im 48/54  bone]
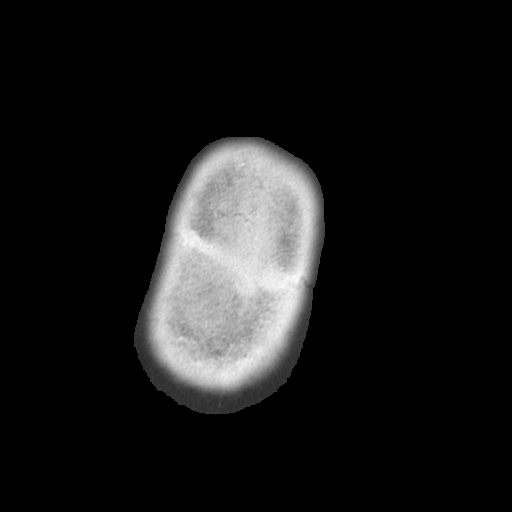

[17 of 30 positions shown; findings below may reference images not displayed]

FINDINGS: Bony calvarium is intact. There are changes consistent with a
ventriculostomy catheter on the right. Areas of encephalomalacia and
volume loss are again identified. Dystrophic calcifications in the
right frontal region are again seen and stable. No findings to
suggest acute hemorrhage, acute infarction or space-occupying mass
lesion are noted.
IMPRESSION: Chronic changes of encephalomalacia.  No acute abnormality noted.

## 2016-09-11 IMAGING — DX DG CHEST 2V
2 series · 2 of 2 positions shown · non-contrast
Comparison: 04/04/2015

CLINICAL DATA: Shortness of breath.  Cold exposure, wheezing.

EXAM:
CHEST  2 VIEW

[chest lat]
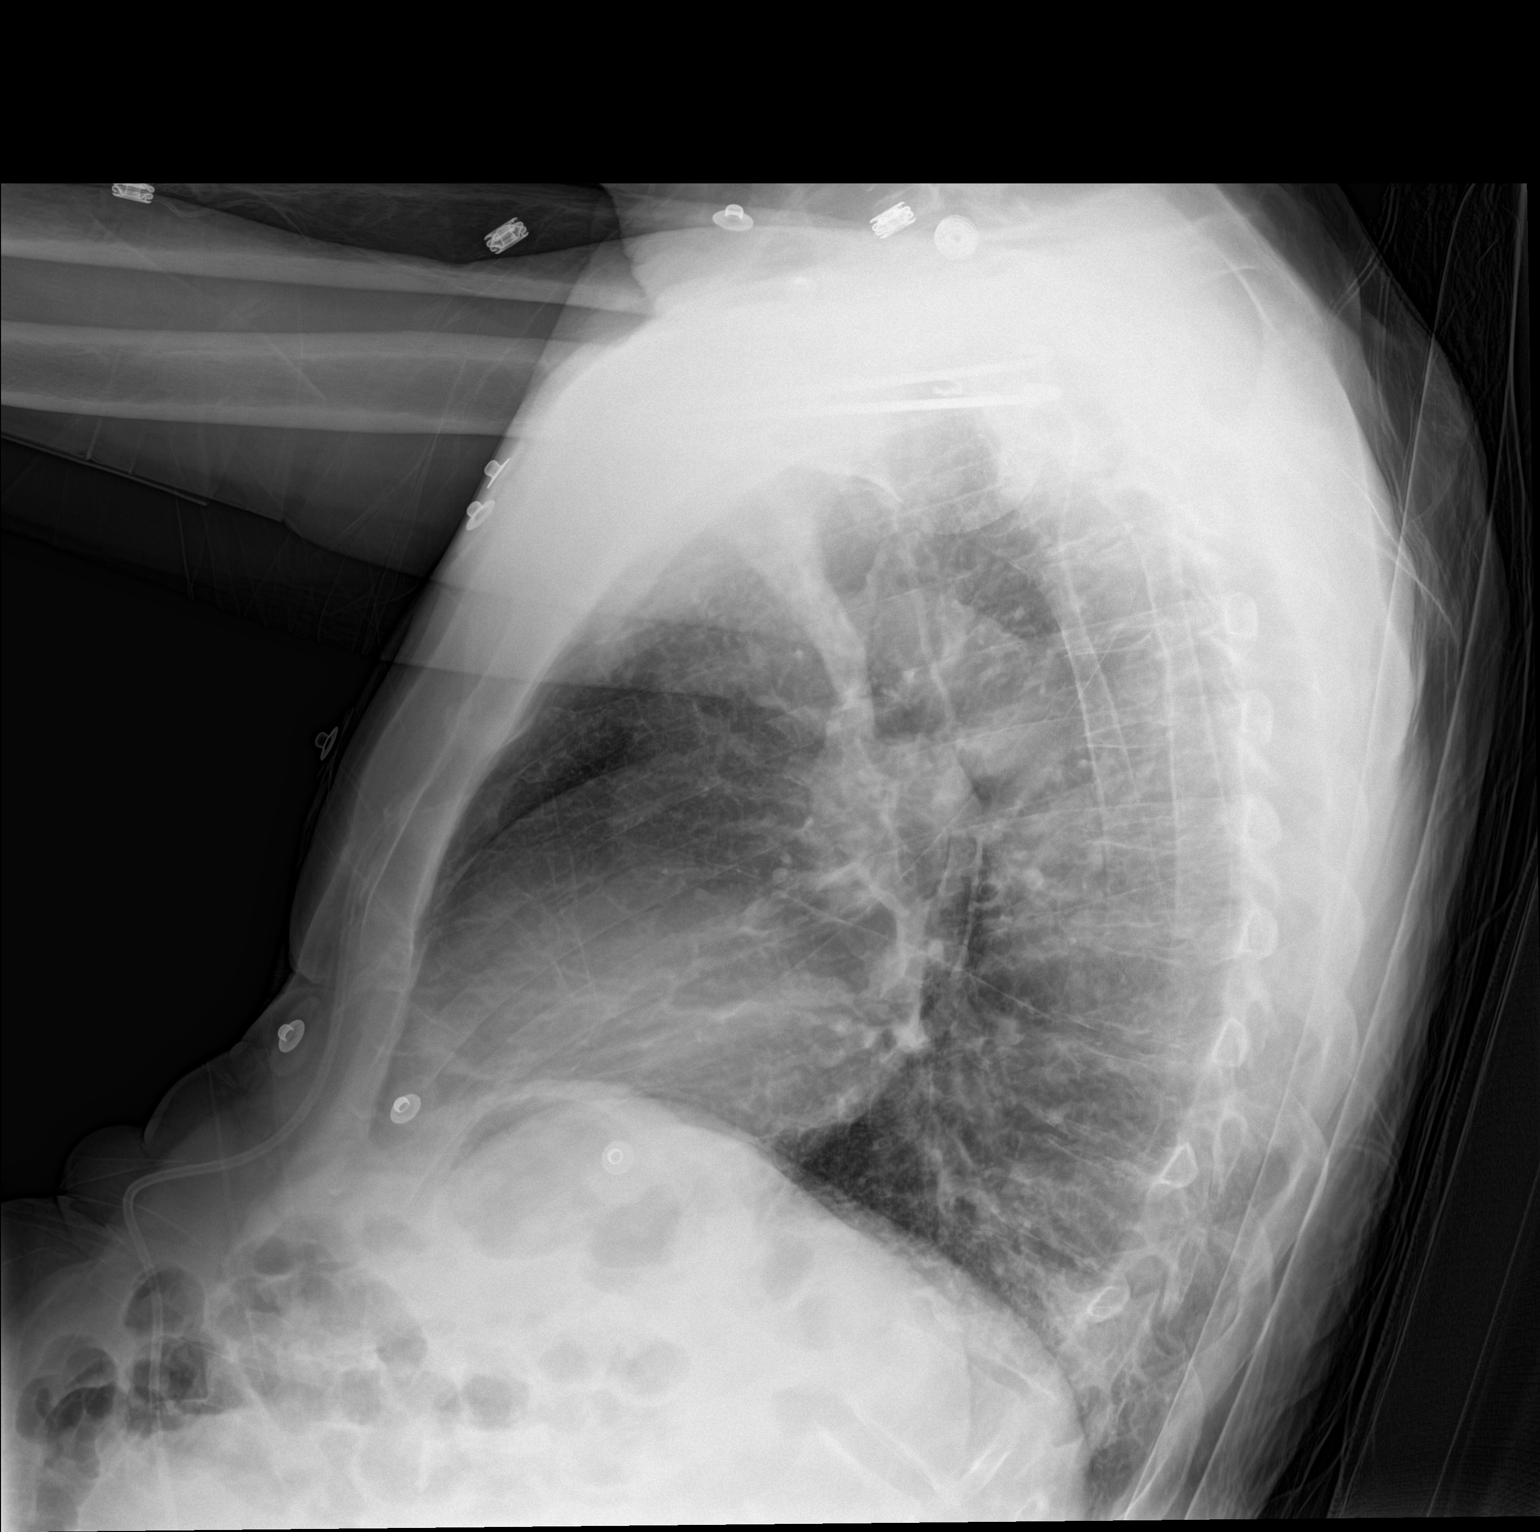

[chest ap]
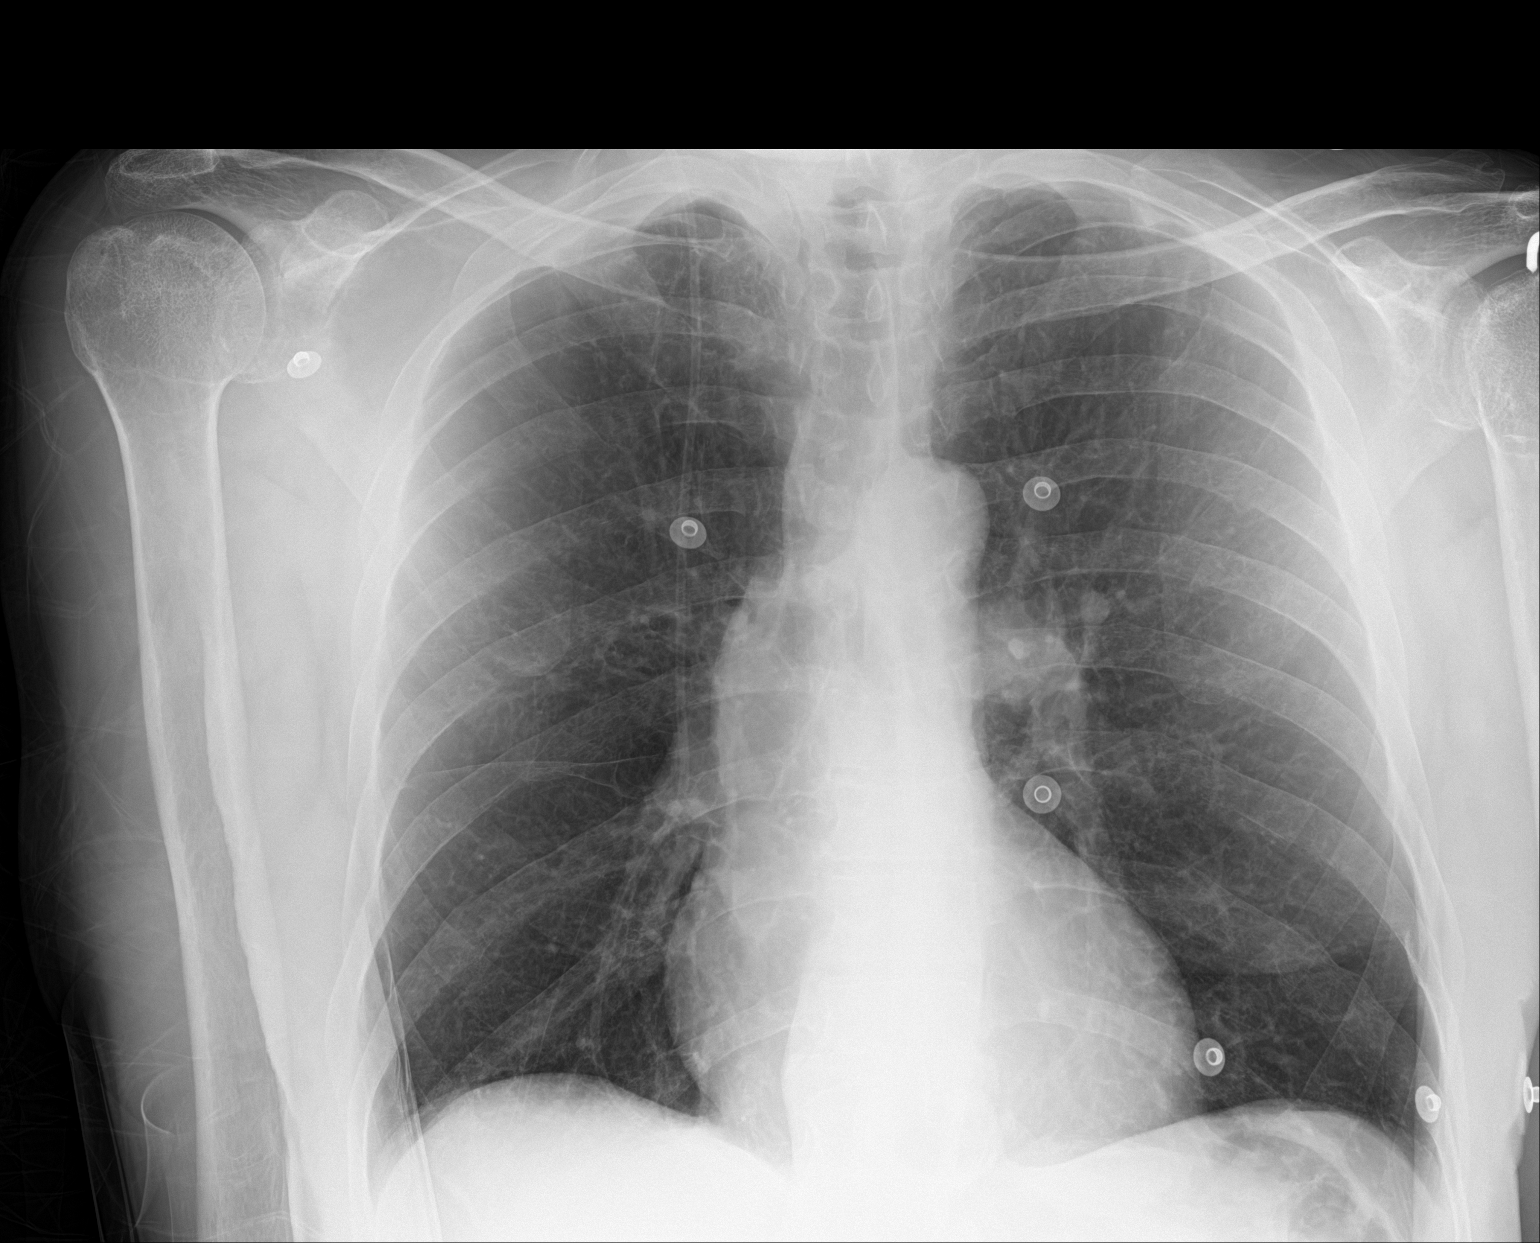

[2 of 2 positions shown; findings below may reference images not displayed]

FINDINGS: Heart and mediastinal contours are within normal limits. No focal
opacities or effusions. No acute bony abnormality.
IMPRESSION: No active cardiopulmonary disease.

## 2016-10-15 IMAGING — CT CT PELVIS W/ CM
2 of 9 series · 13 of 46 positions shown, 15 images · IV contrast (omnipaque)
Comparison: None.

CLINICAL DATA: Acute onset of moderate right thigh pain. Initial
encounter.

EXAM:
CT PELVIS AND RIGHT LOWER EXTREMITY WITH CONTRAST
TECHNIQUE: Multidetector CT imaging of the pelvis and right lower extremity was
performed according to the standard protocol following bolus
administration of intravenous contrast. Multiplanar CT image
reconstructions were also generated.
CONTRAST:  80mL OMNIPAQUE IOHEXOL 300 MG/ML  SOLN

[Series 5: coronal images · coronal · 0.39mm/px · 2 of 80 slices shown]
[im 27/80  soft-tissue]
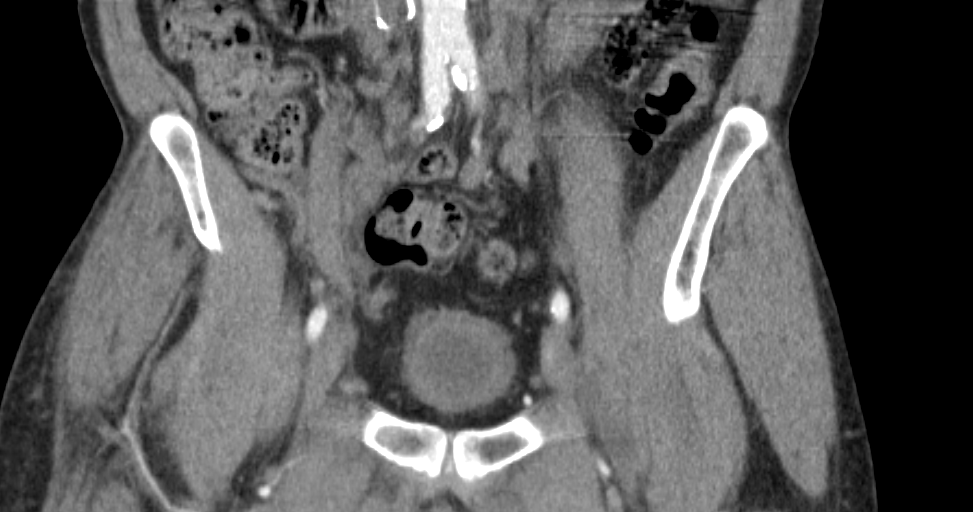
[im 53/80  soft-tissue]
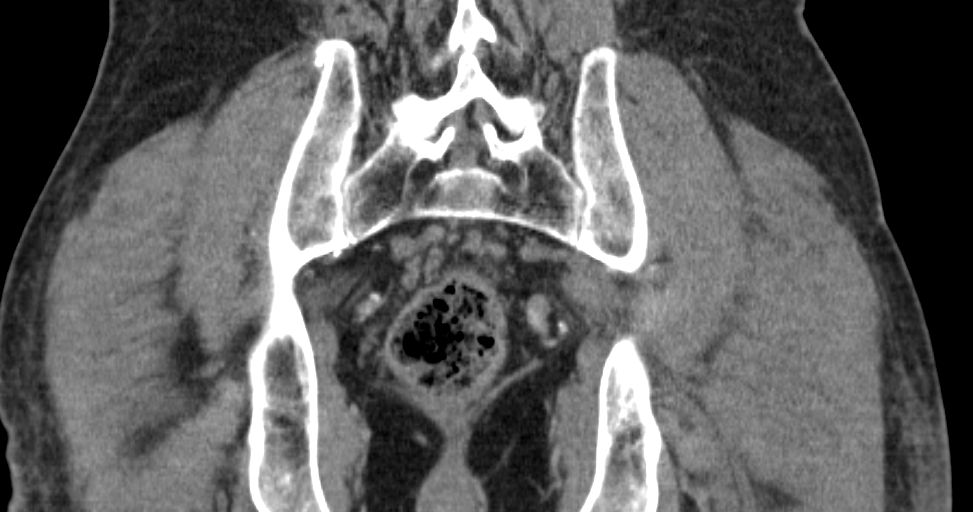

[Series 10: extremity 1.0 b30f · axial · 0.48mm/px · z∈[+793,+1277]mm · 11 of 558 slices shown, 13 images]
[im 49/558  soft-tissue]
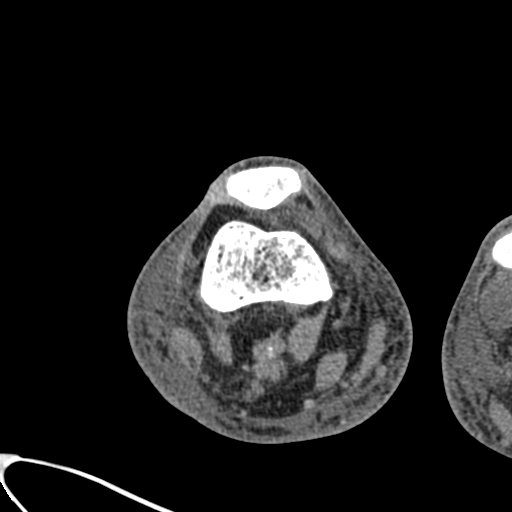
[im 49/558  bone]
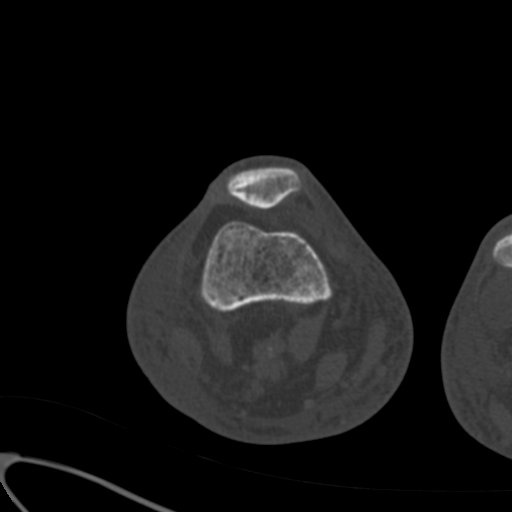
[im 122/558  soft-tissue]
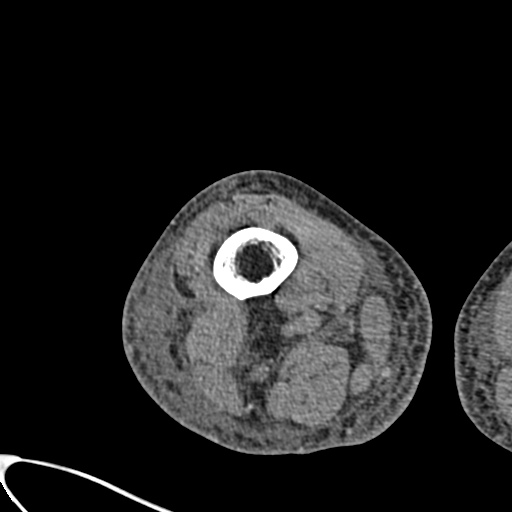
[im 170/558  soft-tissue]
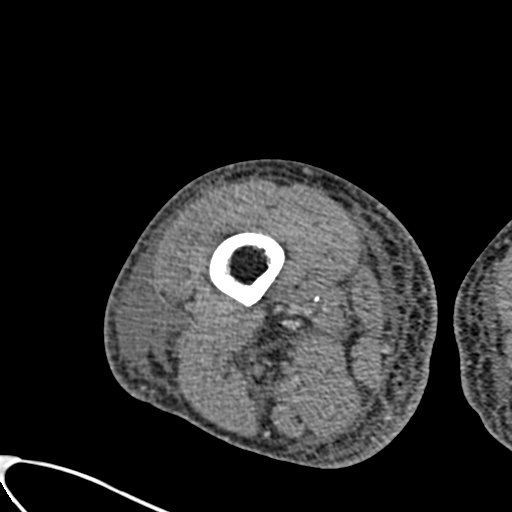
[im 243/558  soft-tissue]
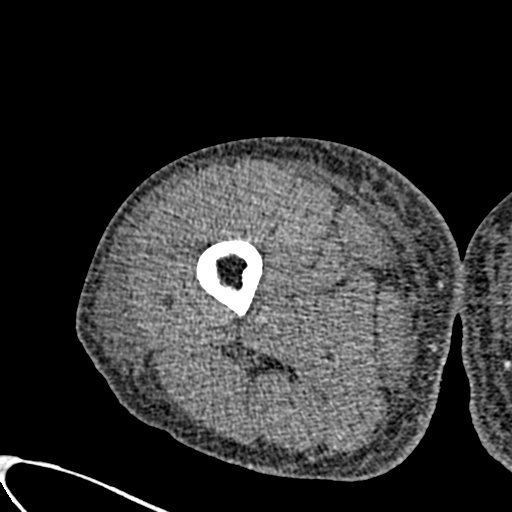
[im 315/558  soft-tissue]
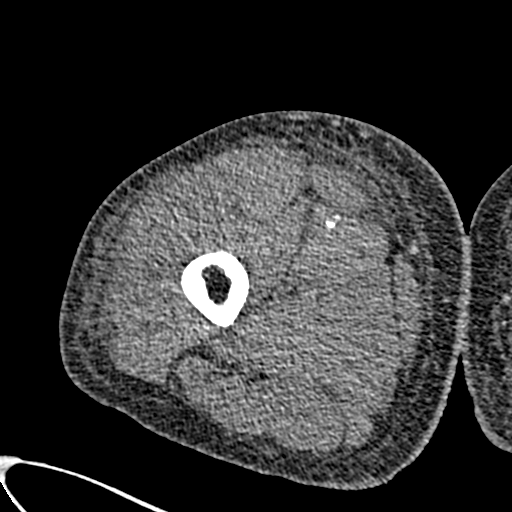
[im 388/558  soft-tissue]
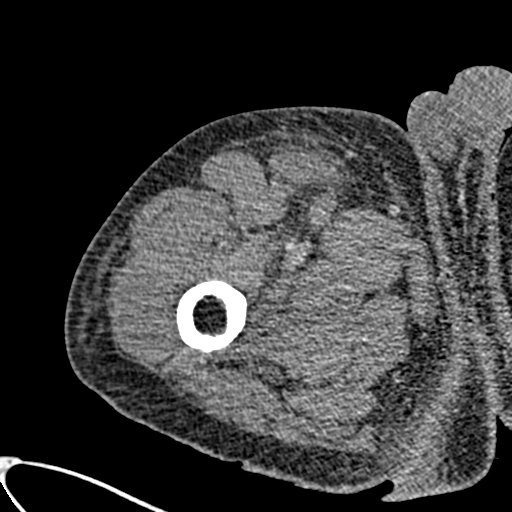
[im 436/558  soft-tissue]
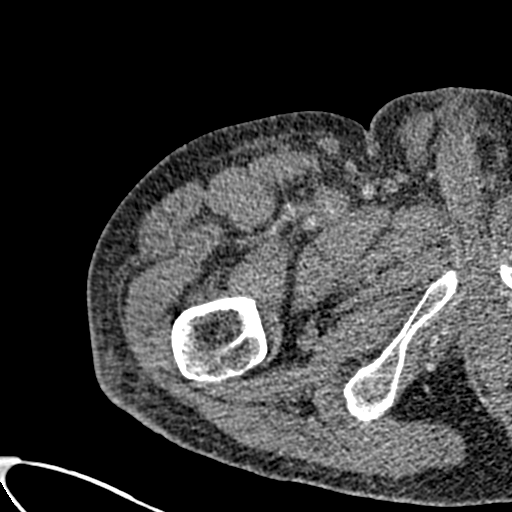
[im 461/558  lung]
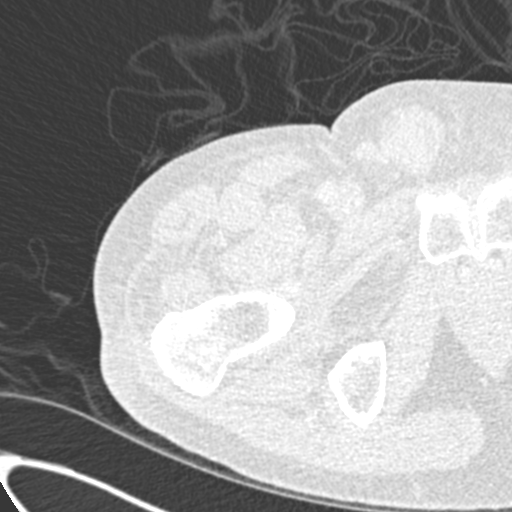
[im 485/558  lung]
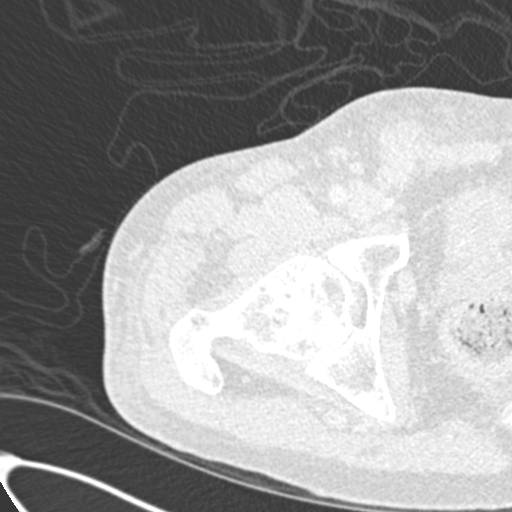
[im 509/558  soft-tissue]
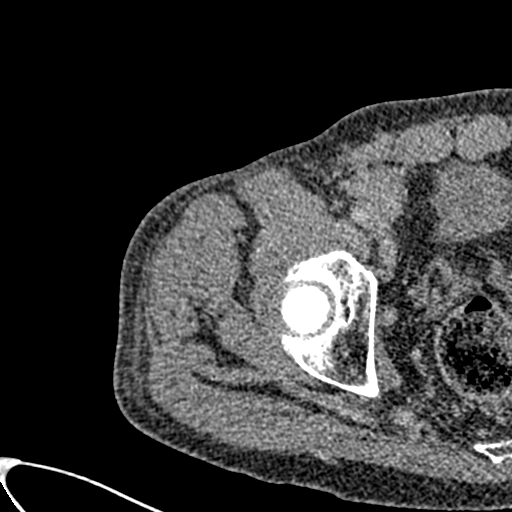
[im 509/558  lung]
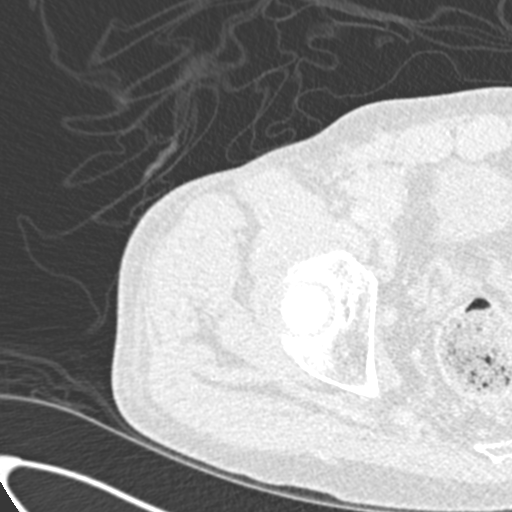
[im 533/558  lung]
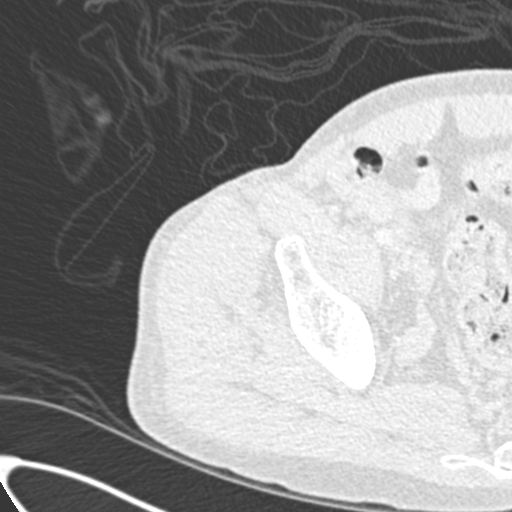

[13 of 46 positions shown; findings below may reference images not displayed]

FINDINGS: CT PELVIS FINDINGS

Visualized small and large bowel loops are grossly unremarkable.
Scattered calcification is noted along the distal abdominal aorta
and its branches. An IVC filter is relatively inferior in position,
just above the junction of the common iliac veins. Prominent
bilateral pelvic vasculature is noted, particularly on the left, and
there is a clump of soft tissue density along the right inguinal
canal, thought to reflect a large right-sided varicocele.

Visualized inguinal nodes remain normal in size. The bladder is
largely decompressed and not well assessed.

No acute osseous abnormalities are seen.

CT RIGHT LOWER EXTREMITY FINDINGS

There is relatively diffuse soft tissue edema tracking along the
right thigh, particularly prominent laterally, and worsening about
the level of the knee. More mild edema is noted about the left
thigh. Mild skin thickening is suggested along the medial right
thigh, raising question for cellulitis.

The underlying osseous structures are grossly unremarkable. The knee
joint is unremarkable in appearance. Trace knee joint fluid remains
within normal limits. The visualized musculature is grossly
unremarkable. Scattered vascular calcifications are seen.
IMPRESSION: 1. Relatively diffuse soft tissue edema tracking along the right
thigh, particularly prominent laterally, and worsening about the
level of the knee. More mild edema is also seen about the left
thigh. Mild skin thickening suggested along the medial right thigh,
raising question for cellulitis.
2. Clump of soft tissue density along the right inguinal canal is
thought reflect a large right-sided varicocele. Would correlate for
associated symptoms.
3. Prominent bilateral pelvic vasculature noted, particularly on the
left, of uncertain significance. This may reflect underlying chronic
venous obstruction or increased venous pressure.
4. Scattered calcification along the distal abdominal aorta and its
branches.

## 2016-10-15 IMAGING — CT CT HEAD W/O CM
2 series · 15 of 30 positions shown, 19 images · non-contrast
Comparison: CT of the head performed 07/06/2015

CLINICAL DATA: Assess ventriculoperitoneal shunt. Status post fall.
Initial encounter.

EXAM:
CT HEAD WITHOUT CONTRAST
TECHNIQUE: Contiguous axial images were obtained from the base of the skull
through the vertex without intravenous contrast.

[Series 2: head w/o · axial · non-contrast · 0.45mm/px · z∈[-148,-28]mm · 13 of 30 slices shown, 17 images]
[im 3/30  brain]
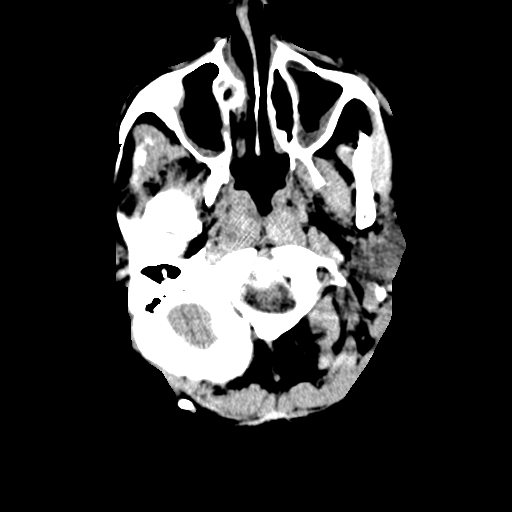
[im 3/30  bone]
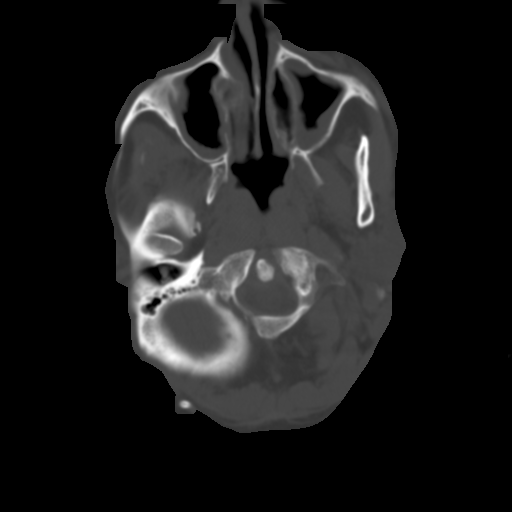
[im 5/30  brain]
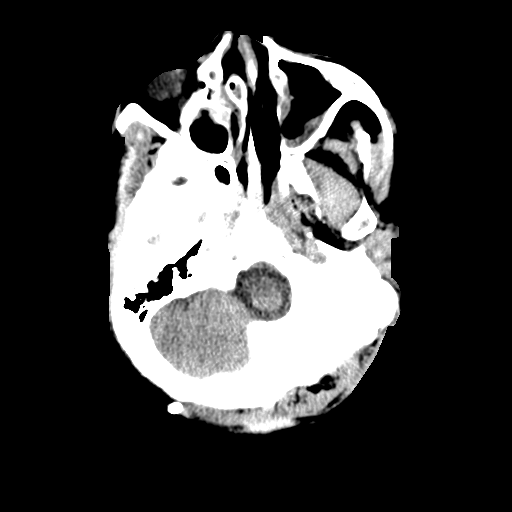
[im 7/30  brain]
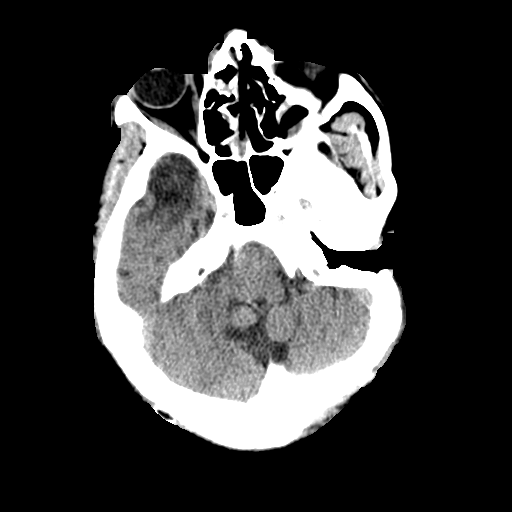
[im 9/30  brain]
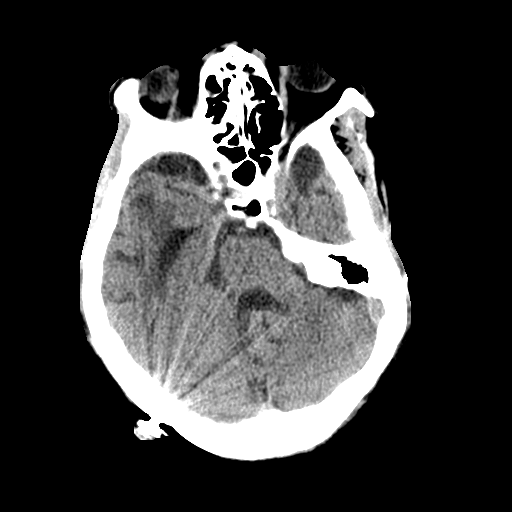
[im 11/30  brain]
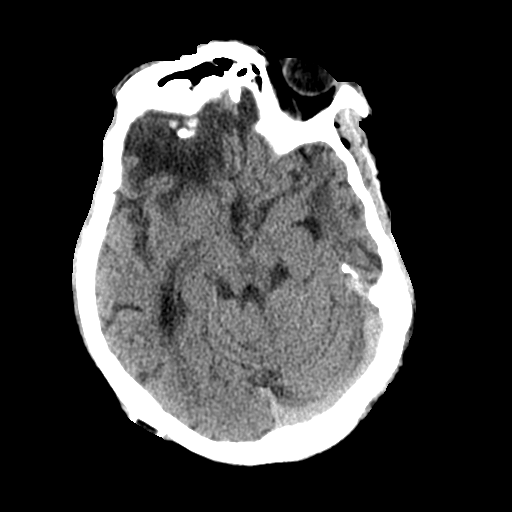
[im 11/30  bone]
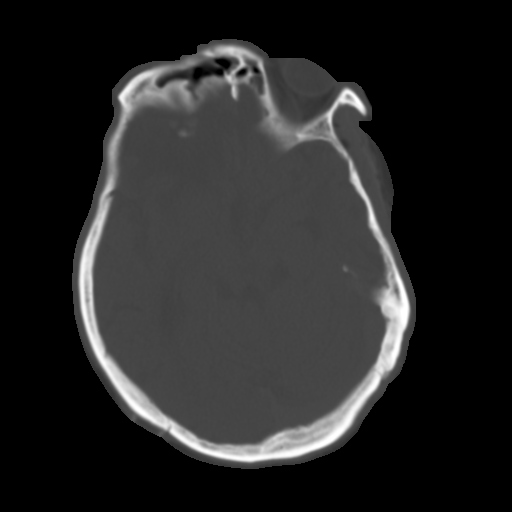
[im 13/30  brain]
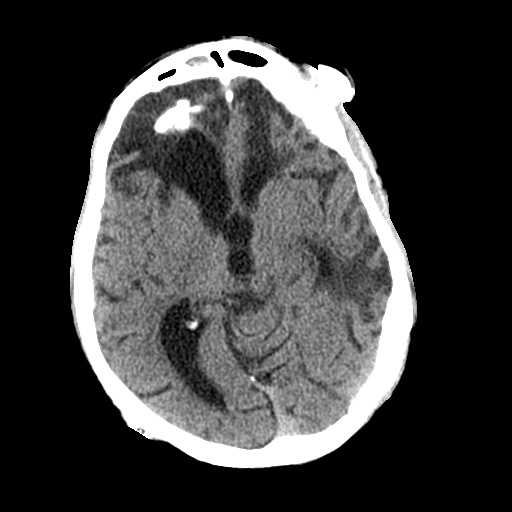
[im 15/30  brain]
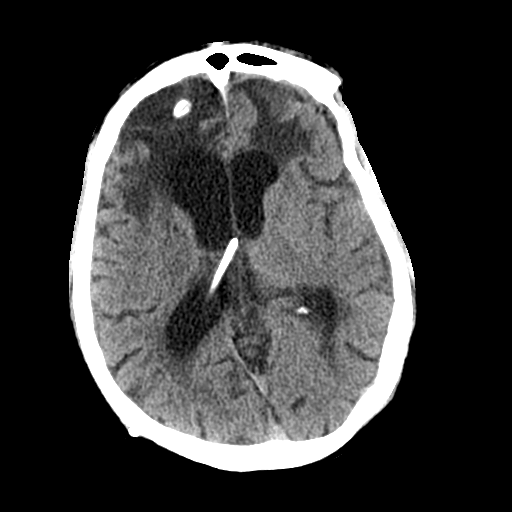
[im 17/30  brain]
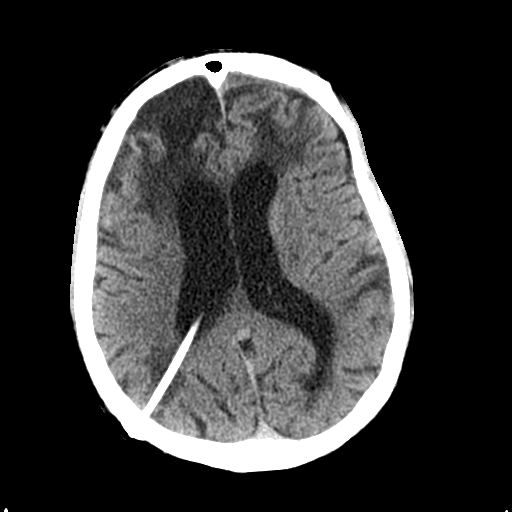
[im 19/30  brain]
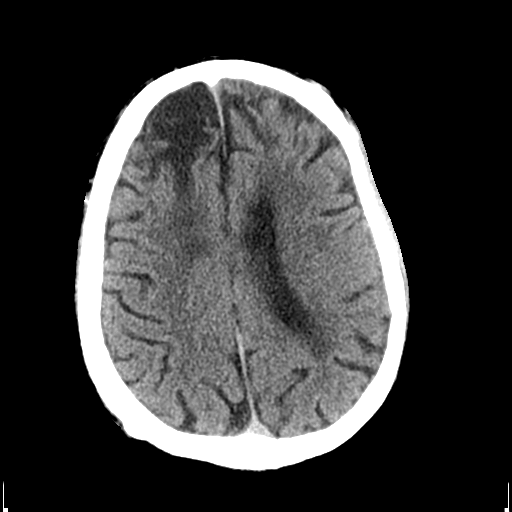
[im 19/30  bone]
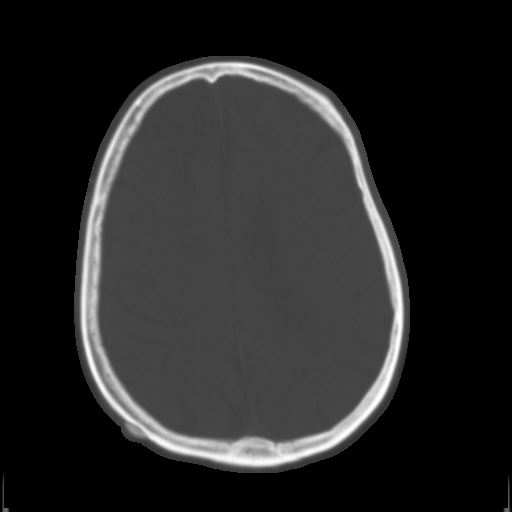
[im 21/30  brain]
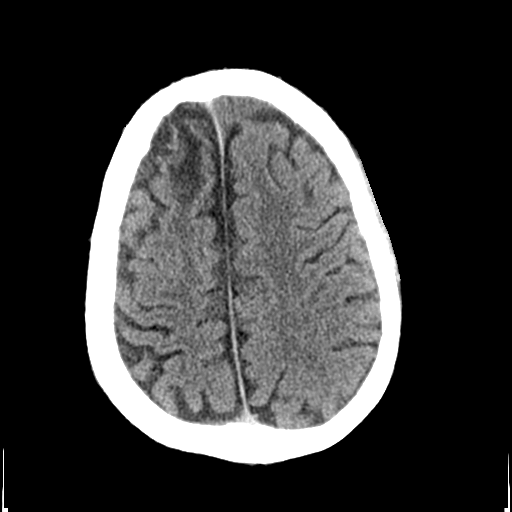
[im 23/30  brain]
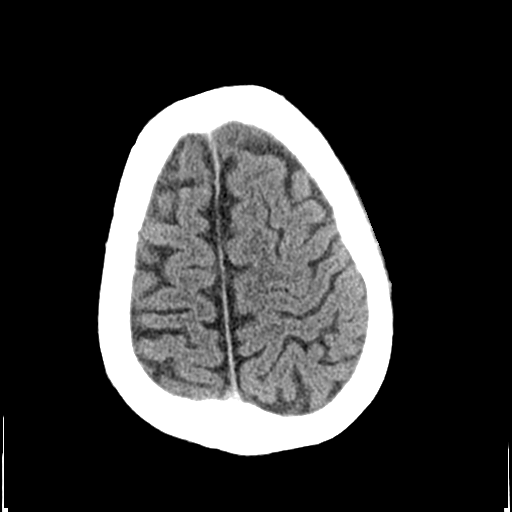
[im 25/30  brain]
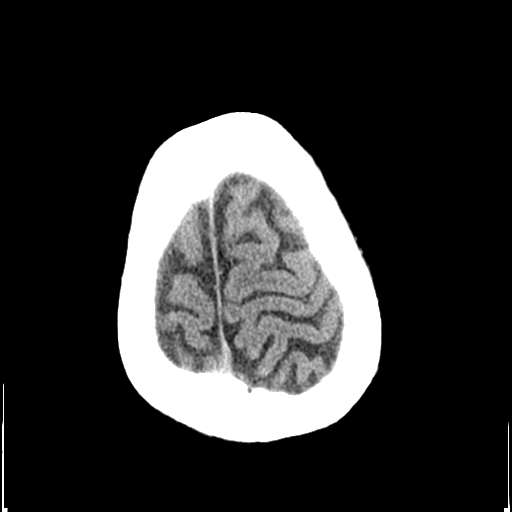
[im 27/30  brain]
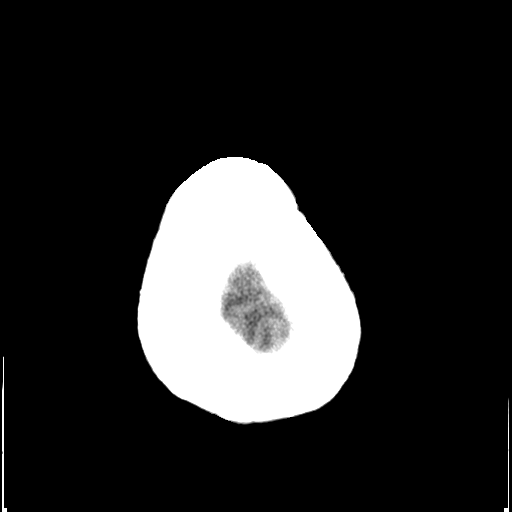
[im 27/30  bone]
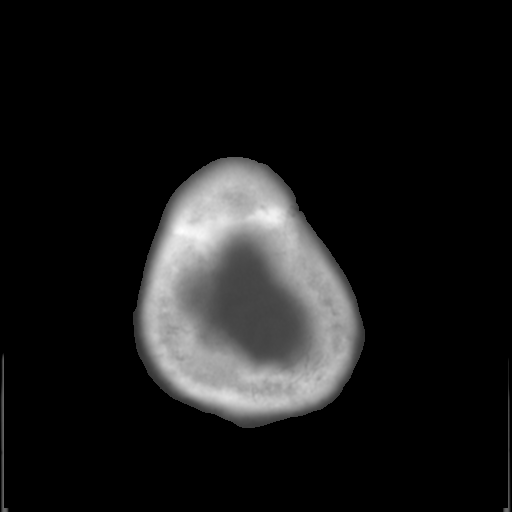

[Series 3: bone windows · axial · 0.45mm/px · z∈[-148,-128]mm · 2 of 30 slices shown]
[im 3/30  bone]
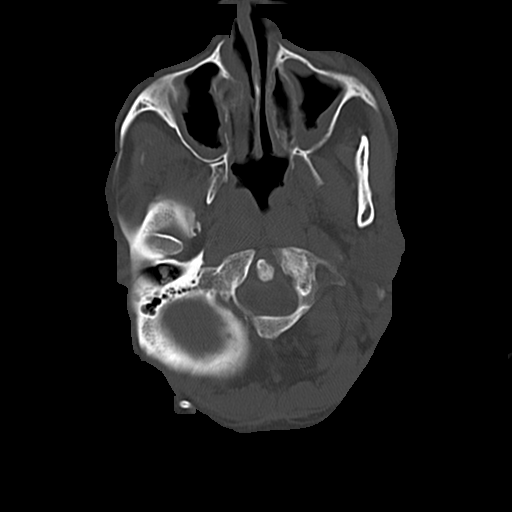
[im 7/30  bone]
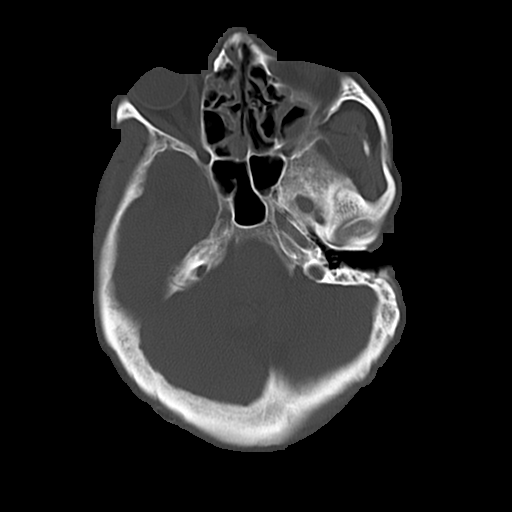

[15 of 30 positions shown; findings below may reference images not displayed]

FINDINGS: There is no evidence of acute infarction, mass lesion, or intra- or
extra-axial hemorrhage on CT.

Prominence of the ventricles and sulci reflects moderate cortical
volume loss. A chronic infarct is noted at the right frontal lobe,
with dense calcification and ex vacuo dilatation of the frontal horn
of the right lateral ventricle. There is involvement of the right
basal ganglia. Chronic encephalomalacia is also noted at the
temporal lobes bilaterally and left frontal lobe, reflecting remote
infarct.

The patient's ventriculoperitoneal shunt is noted ending at the
right lateral ventricle.

The brainstem and fourth ventricle are within normal limits. The
basal ganglia are unremarkable in appearance. The cerebral
hemispheres demonstrate grossly normal gray-white differentiation.
No mass effect or midline shift is seen.

There is no evidence of fracture; visualized osseous structures are
unremarkable in appearance. The visualized portions of the orbits
are within normal limits. Mucosal thickening is noted at the
maxillary sinuses bilaterally, and there is mild partial
opacification of the frontal sinuses. The remaining paranasal
sinuses and mastoid air cells are well-aerated. No significant soft
tissue abnormalities are seen.
IMPRESSION: 1. No acute intracranial pathology seen on CT.
2. Ventriculoperitoneal shunt noted ending at the right lateral
ventricle.
3. Moderate cortical volume loss. Chronic infarct at the right
frontal lobe, with dense calcification and ex vacuo dilatation of
the frontal horn of the right lateral ventricle.
4. Chronic encephalomalacia at the temporal lobes bilaterally and
left frontal lobe, reflecting remote infarct.
5. Mucosal thickening at the maxillary sinuses bilaterally, and mild
partial opacification of the frontal sinuses.

## 2017-03-17 ENCOUNTER — Emergency Department (HOSPITAL_COMMUNITY): Payer: Medicare Other

## 2017-03-17 ENCOUNTER — Encounter (HOSPITAL_COMMUNITY): Payer: Self-pay | Admitting: *Deleted

## 2017-03-17 ENCOUNTER — Inpatient Hospital Stay (HOSPITAL_COMMUNITY)
Admission: EM | Admit: 2017-03-17 | Discharge: 2017-03-24 | DRG: 100 | Disposition: A | Discharge status: Skilled Nursing Facility | Payer: Medicare Other | Attending: Internal Medicine | Admitting: Internal Medicine

## 2017-03-17 ENCOUNTER — Inpatient Hospital Stay (HOSPITAL_COMMUNITY): Payer: Medicare Other

## 2017-03-17 DIAGNOSIS — F17210 Nicotine dependence, cigarettes, uncomplicated: Secondary | ICD-10-CM | POA: Diagnosis present

## 2017-03-17 DIAGNOSIS — G9341 Metabolic encephalopathy: Secondary | ICD-10-CM | POA: Diagnosis present

## 2017-03-17 DIAGNOSIS — I160 Hypertensive urgency: Secondary | ICD-10-CM | POA: Diagnosis present

## 2017-03-17 DIAGNOSIS — F10129 Alcohol abuse with intoxication, unspecified: Secondary | ICD-10-CM | POA: Diagnosis not present

## 2017-03-17 DIAGNOSIS — R451 Restlessness and agitation: Secondary | ICD-10-CM | POA: Diagnosis not present

## 2017-03-17 DIAGNOSIS — I482 Chronic atrial fibrillation: Secondary | ICD-10-CM | POA: Diagnosis not present

## 2017-03-17 DIAGNOSIS — F1099 Alcohol use, unspecified with unspecified alcohol-induced disorder: Secondary | ICD-10-CM | POA: Diagnosis not present

## 2017-03-17 DIAGNOSIS — Z9114 Patient's other noncompliance with medication regimen: Secondary | ICD-10-CM

## 2017-03-17 DIAGNOSIS — R402122 Coma scale, eyes open, to pain, at arrival to emergency department: Secondary | ICD-10-CM | POA: Diagnosis present

## 2017-03-17 DIAGNOSIS — F1110 Opioid abuse, uncomplicated: Secondary | ICD-10-CM | POA: Diagnosis present

## 2017-03-17 DIAGNOSIS — E872 Acidosis: Secondary | ICD-10-CM | POA: Diagnosis present

## 2017-03-17 DIAGNOSIS — I639 Cerebral infarction, unspecified: Secondary | ICD-10-CM | POA: Diagnosis present

## 2017-03-17 DIAGNOSIS — G913 Post-traumatic hydrocephalus, unspecified: Secondary | ICD-10-CM | POA: Diagnosis present

## 2017-03-17 DIAGNOSIS — G4089 Other seizures: Principal | ICD-10-CM | POA: Diagnosis present

## 2017-03-17 DIAGNOSIS — R402352 Coma scale, best motor response, localizes pain, at arrival to emergency department: Secondary | ICD-10-CM | POA: Diagnosis present

## 2017-03-17 DIAGNOSIS — E876 Hypokalemia: Secondary | ICD-10-CM | POA: Diagnosis present

## 2017-03-17 DIAGNOSIS — F10239 Alcohol dependence with withdrawal, unspecified: Secondary | ICD-10-CM | POA: Diagnosis present

## 2017-03-17 DIAGNOSIS — Z981 Arthrodesis status: Secondary | ICD-10-CM

## 2017-03-17 DIAGNOSIS — A419 Sepsis, unspecified organism: Secondary | ICD-10-CM | POA: Diagnosis not present

## 2017-03-17 DIAGNOSIS — G9340 Encephalopathy, unspecified: Secondary | ICD-10-CM

## 2017-03-17 DIAGNOSIS — G40901 Epilepsy, unspecified, not intractable, with status epilepticus: Secondary | ICD-10-CM | POA: Diagnosis not present

## 2017-03-17 DIAGNOSIS — I480 Paroxysmal atrial fibrillation: Secondary | ICD-10-CM | POA: Diagnosis not present

## 2017-03-17 DIAGNOSIS — J9601 Acute respiratory failure with hypoxia: Secondary | ICD-10-CM | POA: Diagnosis present

## 2017-03-17 DIAGNOSIS — Z87820 Personal history of traumatic brain injury: Secondary | ICD-10-CM | POA: Diagnosis not present

## 2017-03-17 DIAGNOSIS — Z79899 Other long term (current) drug therapy: Secondary | ICD-10-CM

## 2017-03-17 DIAGNOSIS — R400 Somnolence: Secondary | ICD-10-CM | POA: Diagnosis not present

## 2017-03-17 DIAGNOSIS — R402222 Coma scale, best verbal response, incomprehensible words, at arrival to emergency department: Secondary | ICD-10-CM | POA: Diagnosis present

## 2017-03-17 DIAGNOSIS — N179 Acute kidney failure, unspecified: Secondary | ICD-10-CM

## 2017-03-17 DIAGNOSIS — Z781 Physical restraint status: Secondary | ICD-10-CM

## 2017-03-17 DIAGNOSIS — G40909 Epilepsy, unspecified, not intractable, without status epilepticus: Secondary | ICD-10-CM | POA: Diagnosis not present

## 2017-03-17 DIAGNOSIS — I10 Essential (primary) hypertension: Secondary | ICD-10-CM

## 2017-03-17 DIAGNOSIS — Z823 Family history of stroke: Secondary | ICD-10-CM

## 2017-03-17 DIAGNOSIS — Z982 Presence of cerebrospinal fluid drainage device: Secondary | ICD-10-CM

## 2017-03-17 DIAGNOSIS — I361 Nonrheumatic tricuspid (valve) insufficiency: Secondary | ICD-10-CM | POA: Diagnosis not present

## 2017-03-17 DIAGNOSIS — Z86718 Personal history of other venous thrombosis and embolism: Secondary | ICD-10-CM

## 2017-03-17 DIAGNOSIS — Z95828 Presence of other vascular implants and grafts: Secondary | ICD-10-CM

## 2017-03-17 DIAGNOSIS — I69351 Hemiplegia and hemiparesis following cerebral infarction affecting right dominant side: Secondary | ICD-10-CM | POA: Diagnosis not present

## 2017-03-17 DIAGNOSIS — R569 Unspecified convulsions: Secondary | ICD-10-CM | POA: Diagnosis not present

## 2017-03-17 DIAGNOSIS — Z4659 Encounter for fitting and adjustment of other gastrointestinal appliance and device: Secondary | ICD-10-CM

## 2017-03-17 DIAGNOSIS — Z9289 Personal history of other medical treatment: Secondary | ICD-10-CM

## 2017-03-17 LAB — COMPREHENSIVE METABOLIC PANEL
ALBUMIN: 3.8 g/dL (ref 3.5–5.0)
ALK PHOS: 71 U/L (ref 38–126)
ALT: 16 U/L — ABNORMAL LOW (ref 17–63)
AST: 31 U/L (ref 15–41)
Anion gap: 10 (ref 5–15)
BUN: 11 mg/dL (ref 6–20)
CALCIUM: 8.7 mg/dL — AB (ref 8.9–10.3)
CO2: 21 mmol/L — AB (ref 22–32)
CREATININE: 1.77 mg/dL — AB (ref 0.61–1.24)
Chloride: 111 mmol/L (ref 101–111)
GFR calc Af Amer: 45 mL/min — ABNORMAL LOW (ref >60)
GFR calc non Af Amer: 39 mL/min — ABNORMAL LOW (ref >60)
GLUCOSE: 136 mg/dL — AB (ref 65–99)
Potassium: 3 mmol/L — ABNORMAL LOW (ref 3.5–5.1)
SODIUM: 142 mmol/L (ref 135–145)
Total Bilirubin: 0.6 mg/dL (ref 0.3–1.2)
Total Protein: 7.6 g/dL (ref 6.5–8.1)

## 2017-03-17 LAB — URINALYSIS, ROUTINE W REFLEX MICROSCOPIC
BACTERIA UA: NONE SEEN
Bilirubin Urine: NEGATIVE
Glucose, UA: 50 mg/dL — AB
KETONES UR: NEGATIVE mg/dL
LEUKOCYTES UA: NEGATIVE
Nitrite: NEGATIVE
PROTEIN: NEGATIVE mg/dL
SQUAMOUS EPITHELIAL / LPF: NONE SEEN
Specific Gravity, Urine: 1.003 — ABNORMAL LOW (ref 1.005–1.030)
pH: 7 (ref 5.0–8.0)

## 2017-03-17 LAB — CBC WITH DIFFERENTIAL/PLATELET
BASOS ABS: 0.2 10*3/uL — AB (ref 0.0–0.1)
BASOS PCT: 1 %
EOS ABS: 0 10*3/uL (ref 0.0–0.7)
EOS PCT: 0 %
HEMATOCRIT: 47.4 % (ref 39.0–52.0)
HEMOGLOBIN: 16 g/dL (ref 13.0–17.0)
Lymphocytes Relative: 13 %
Lymphs Abs: 2.4 10*3/uL (ref 0.7–4.0)
MCH: 30.7 pg (ref 26.0–34.0)
MCHC: 33.8 g/dL (ref 30.0–36.0)
MCV: 91 fL (ref 78.0–100.0)
Monocytes Absolute: 1.6 10*3/uL — ABNORMAL HIGH (ref 0.1–1.0)
Monocytes Relative: 8 %
Neutro Abs: 15.2 10*3/uL — ABNORMAL HIGH (ref 1.7–7.7)
Neutrophils Relative %: 78 %
Platelets: 206 10*3/uL (ref 150–400)
RBC: 5.21 MIL/uL (ref 4.22–5.81)
RDW: 14.9 % (ref 11.5–15.5)
WBC: 19.4 10*3/uL — AB (ref 4.0–10.5)

## 2017-03-17 LAB — I-STAT ARTERIAL BLOOD GAS, ED
ACID-BASE DEFICIT: 5 mmol/L — AB (ref 0.0–2.0)
BICARBONATE: 20.1 mmol/L (ref 20.0–28.0)
O2 SAT: 99 %
PO2 ART: 165 mmHg — AB (ref 83.0–108.0)
Patient temperature: 98
TCO2: 21 mmol/L (ref 0–100)
pCO2 arterial: 37.7 mmHg (ref 32.0–48.0)
pH, Arterial: 7.334 — ABNORMAL LOW (ref 7.350–7.450)

## 2017-03-17 LAB — I-STAT CG4 LACTIC ACID, ED: Lactic Acid, Venous: 3.93 mmol/L (ref 0.5–1.9)

## 2017-03-17 LAB — ETHANOL

## 2017-03-17 LAB — RAPID URINE DRUG SCREEN, HOSP PERFORMED
Amphetamines: NOT DETECTED
BENZODIAZEPINES: NOT DETECTED
Barbiturates: NOT DETECTED
COCAINE: NOT DETECTED
OPIATES: NOT DETECTED
Tetrahydrocannabinol: NOT DETECTED

## 2017-03-17 LAB — CK: CK TOTAL: 633 U/L — AB (ref 49–397)

## 2017-03-17 LAB — MAGNESIUM: Magnesium: 2.2 mg/dL (ref 1.7–2.4)

## 2017-03-17 LAB — PROCALCITONIN: PROCALCITONIN: 2.67 ng/mL

## 2017-03-17 LAB — PROTIME-INR
INR: 0.99
Prothrombin Time: 13.1 seconds (ref 11.4–15.2)

## 2017-03-17 LAB — PHOSPHORUS: Phosphorus: 3.2 mg/dL (ref 2.5–4.6)

## 2017-03-17 LAB — TRIGLYCERIDES: Triglycerides: 74 mg/dL (ref <150)

## 2017-03-17 LAB — APTT: aPTT: 27 seconds (ref 24–36)

## 2017-03-17 MED ORDER — PROPOFOL 1000 MG/100ML IV EMUL
INTRAVENOUS | Status: AC
  Filled 2017-03-17: qty 100

## 2017-03-17 MED ORDER — PROPOFOL 1000 MG/100ML IV EMUL
5.0000 ug/kg/min | Freq: Once | INTRAVENOUS | Status: DC
  Administered 2017-03-17: 10 ug/kg/min via INTRAVENOUS

## 2017-03-17 MED ORDER — ROCURONIUM BROMIDE 50 MG/5ML IV SOLN
INTRAVENOUS | Status: AC | PRN
  Administered 2017-03-17: 100 mg via INTRAVENOUS

## 2017-03-17 MED ORDER — LORAZEPAM 2 MG/ML IJ SOLN
INTRAMUSCULAR | Status: AC
  Filled 2017-03-17: qty 1

## 2017-03-17 MED ORDER — HYDRALAZINE HCL 20 MG/ML IJ SOLN
10.0000 mg | Freq: Once | INTRAMUSCULAR | Status: AC
  Administered 2017-03-17: 10 mg via INTRAVENOUS
  Filled 2017-03-17: qty 1

## 2017-03-17 MED ORDER — LORAZEPAM 2 MG/ML IJ SOLN
1.0000 mg | Freq: Once | INTRAMUSCULAR | Status: AC
  Administered 2017-03-17: 1 mg via INTRAVENOUS

## 2017-03-17 MED ORDER — PANTOPRAZOLE SODIUM 40 MG IV SOLR
40.0000 mg | Freq: Every day | INTRAVENOUS | Status: DC
  Administered 2017-03-17 – 2017-03-19 (×3): 40 mg via INTRAVENOUS
  Filled 2017-03-17 (×4): qty 40

## 2017-03-17 MED ORDER — HEPARIN SODIUM (PORCINE) 5000 UNIT/ML IJ SOLN
5000.0000 [IU] | Freq: Three times a day (TID) | INTRAMUSCULAR | Status: DC
  Administered 2017-03-17 – 2017-03-21 (×13): 5000 [IU] via SUBCUTANEOUS
  Filled 2017-03-17 (×13): qty 1

## 2017-03-17 MED ORDER — VANCOMYCIN HCL 10 G IV SOLR
1500.0000 mg | Freq: Once | INTRAVENOUS | Status: AC
  Administered 2017-03-17: 1500 mg via INTRAVENOUS
  Filled 2017-03-17: qty 1500

## 2017-03-17 MED ORDER — SODIUM CHLORIDE 0.9 % IV SOLN: 250.0000 mL | INTRAVENOUS | Status: DC | PRN

## 2017-03-17 MED ORDER — PROPOFOL 1000 MG/100ML IV EMUL
0.0000 ug/kg/min | INTRAVENOUS | Status: DC
  Administered 2017-03-17: 50 ug/kg/min via INTRAVENOUS
  Administered 2017-03-18: 35 ug/kg/min via INTRAVENOUS
  Administered 2017-03-18 (×2): 50 ug/kg/min via INTRAVENOUS
  Administered 2017-03-18: 35 ug/kg/min via INTRAVENOUS
  Administered 2017-03-18 – 2017-03-19 (×3): 50 ug/kg/min via INTRAVENOUS
  Filled 2017-03-17 (×8): qty 100

## 2017-03-17 MED ORDER — SODIUM CHLORIDE 0.9 % IV SOLN
1000.0000 mg | Freq: Once | INTRAVENOUS | Status: AC
  Administered 2017-03-17: 1000 mg via INTRAVENOUS
  Filled 2017-03-17: qty 10

## 2017-03-17 MED ORDER — SODIUM CHLORIDE 0.9 % IV SOLN
1.0000 mg | Freq: Once | INTRAVENOUS | Status: AC
  Administered 2017-03-17: 1 mg via INTRAVENOUS
  Filled 2017-03-17: qty 0.2

## 2017-03-17 MED ORDER — SODIUM CHLORIDE 0.9 % IV SOLN
INTRAVENOUS | Status: DC
  Administered 2017-03-17: 20:00:00 via INTRAVENOUS

## 2017-03-17 MED ORDER — VANCOMYCIN HCL IN DEXTROSE 750-5 MG/150ML-% IV SOLN
750.0000 mg | Freq: Two times a day (BID) | INTRAVENOUS | Status: DC
  Administered 2017-03-18 – 2017-03-19 (×3): 750 mg via INTRAVENOUS
  Filled 2017-03-17 (×3): qty 150

## 2017-03-17 MED ORDER — ETOMIDATE 2 MG/ML IV SOLN
INTRAVENOUS | Status: AC | PRN
  Administered 2017-03-17: 20 mg via INTRAVENOUS

## 2017-03-17 MED ORDER — DEXTROSE 5 % IV SOLN
2.0000 g | Freq: Two times a day (BID) | INTRAVENOUS | Status: DC
  Administered 2017-03-17: 2 g via INTRAVENOUS
  Filled 2017-03-17 (×2): qty 2

## 2017-03-17 MED ORDER — THIAMINE HCL 100 MG/ML IJ SOLN
100.0000 mg | Freq: Every day | INTRAMUSCULAR | Status: DC
  Administered 2017-03-17: 100 mg via INTRAVENOUS
  Filled 2017-03-17: qty 1
  Filled 2017-03-17: qty 2

## 2017-03-17 MED ORDER — POTASSIUM CHLORIDE 10 MEQ/100ML IV SOLN
10.0000 meq | INTRAVENOUS | Status: AC
  Administered 2017-03-17 (×4): 10 meq via INTRAVENOUS
  Filled 2017-03-17 (×4): qty 100
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
## 2017-03-17 NOTE — Progress Notes (Signed)
Pt on PSV due to vent dyssynchrony. Sedation meds were given, and pt's RR was only around 7. Pt placed on PRVC 500/16/40%/+5 at this time for rate. RT will continue to monitor.

## 2017-03-17 NOTE — ED Notes (Signed)
Patient transported to and from CT on Vent with RT.  Patient tolerated move well

## 2017-03-17 NOTE — Progress Notes (Signed)
CRITICAL VALUE ALERT  Critical Value:  Lactic acid 3.9  Date & Time Notied:  ED   Provider Notified: Tyson Alias  Orders Received/Actions taken: no new orders

## 2017-03-17 NOTE — ED Notes (Signed)
ABG collected  

## 2017-03-17 NOTE — ED Notes (Signed)
NG tube advanced 15cm per CXR.
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
## 2017-03-17 NOTE — ED Provider Notes (Signed)
I saw and evaluated the patient, reviewed the resident's note and I agree with the findings and plan.  Pertinent History: the patient presents with altered mental status from home, was found at a hotel, his significant other was l1 through found him on the ground behind a door and called 911. 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 He did come around a little bit with Narcan but not very much, his blood sugar was 200, he was given a second dose of Narcan with no significant changes either. There was some evidence of bruising to his right cheek but no other signs of blood at the scene. The patient was unable to give any information. There was some urinary incontinence no other information available this time.  Level V caveat pplies secondary to severity of the condition and altered mental status.  Pertinent Exam findings: on exam the patient is obtunded, he does respond to painful stimuli by moving all 4 extremities, he does not speak but groans, his breathing is shallow and insufficient, he does have a gag, his eyes are deviated to the left, he does not follow commands,he is agitated and constantly despite given 1 mg of Ativan. Abdomen is soft and nontender, heart rate is approximately 95, blood pressure is consistent with palpable pulses at the radial arteries lung sounds are overall clear. Oropharynx is dry, he smells of alcohol, is dentition is very poor, there is no obvious injury to the tongue.  Extremities appear to be without injury, blurry soft compartments, supple joints, no signs of rash or injury.  The patient is critically ill, he required intubation for further evaluation, to protect his airway, to oxygenate.  He will need x-Mariel, CT scan of the brain, CT scan of  the cervical spine, metabolic workup. I would consider that this could be alcohol withdrawal, it could also be related to drugs as he did have drug paraphernalia found in his pocket.This could also be related to trauma, bleeding or hydrocephalus secondary to shunt outlet obstruction. The patient will need to go to intensive care unit. I was present for procedures as listed below.  I was personally present and directly supervised the following procedures:  Intubation  CRITICAL CARE Performed by: Vida Roller Total critical care time: 35 minutes Critical care time was exclusive of separately billable procedures and treating other patients. Critical care was necessary to treat or prevent imminent or life-threatening deterioration. Critical care was time spent personally by me on the following activities: development of treatment plan with patient and/or surrogate as well as nursing, discussions with consultants, evaluation of patient's response to treatment, examination of patient, obtaining history from patient or surrogate, ordering and performing treatments and interventions, ordering and review of laboratory studies, ordering and review of radiographic studies, pulse oximetry and re-evaluation of patient's condition.  The pt required ongoing sedation with propofol Creatinine is up from baseline to 1.7 ETOH is undetectable Keppra was given incase this was seizure CT shows no acute hemorrhage -stable appearing ventricles He has Leukocytosis but after possible seizure and down time, not surprising Needs ICU admission.   EKG Interpretation  Date/Time:  Monday March 17 2017 15:37:32 EDT Ventricular Rate:  99 PR Interval:    QRS Duration: 91 QT Interval:  354 QTC Calculation: 455 R Axis:   87  Text Interpretation:  Sinus rhythm Borderline right axis deviation Low voltage, precordial leads Anteroseptal infarct, age indeterminate Minimal ST depression, inferior leads since last tracing no  significant change Confirmed by Eber Hong ([id number]) on 03/17/2017 4:01:24 PM        Final diagnoses:  Acute encephalopathy  Acute kidney injury (HCC)  Hypokalemia  Essential hypertension      Eber Hong, MD 03/17/17 2337

## 2017-03-17 NOTE — Progress Notes (Signed)
Pharmacy Antibiotic Note  Miguel [name] is a 66 y.o. male admitted on 03/17/2017 with pneumonia and r/o meningitis .  Pharmacy has been consulted for vancomycin and ceftriaxone dosing. WBC is elevated at 19.4. SCr 1.77 (BL ~ 1.25). CrCl ~ 40-45 mL/min  Plan: -Vancomycin 1500 mg IV once, then vancomycin 750 mg IV Q 12 hours -Ceftriaxone 2 gm IV Q 12 hours -Monitor CBC, renal fx, cultures and clinical progress -VT at [facility]   Weight: 160 lb (72.6 kg)  Temp (24hrs), Avg:95.5 F (35.3 C), Min:95.5 F (35.3 C), Max:95.5 F (35.3 C)   Recent Labs Lab 03/17/17 1641  WBC 19.4*  CREATININE 1.77*    CrCl cannot be calculated (Unknown ideal weight.).    No Known Allergies  Antimicrobials this admission: Vanc 8/20 >>  Ceftriaxone 8/20 >>    Thank you for allowing pharmacy to be a part of this patient's care.  Vinnie Level, PharmD., BCPS Clinical Pharmacist Pager [phone]

## 2017-03-17 NOTE — Progress Notes (Signed)
eLink Physician-Brief Progress Note Patient Name: Miguel [name] DOB: [birth year] MRN: [id number]   Date of Service  03/17/2017  HPI/Events of Note  Harm to self restaint  eICU Interventions       Intervention Category Minor Interventions: Agitation / anxiety - evaluation and management  Nelda Bucks. 03/17/2017, 9:17 PM

## 2017-03-17 NOTE — ED Triage Notes (Signed)
Patient here from hotel room after being found unresponsive.  EMS gave 4 mg narcan with minimal improvement.  Patient having assisted ventilations in route to hospital.  Hypotensive with systolic averaged in the 70s.  Last BP for EMS was 90 Systolic.  CBG was 220.  ED provider at bedside.

## 2017-03-17 NOTE — Progress Notes (Signed)
eLink Physician-Brief Progress Note Patient Name: Miguel [name] DOB: 08/10/1950 MRN: [id number]   Date of Service  03/17/2017  HPI/Events of Note  Sedation needed prop  eICU Interventions       Intervention Category Intermediate Interventions: OtherNelda Bucks. 03/17/2017, 9:29 PM

## 2017-03-17 NOTE — ED Notes (Signed)
Attempted report 

## 2017-03-17 NOTE — ED Notes (Signed)
Pt's step-daughter, [name], found pt and called EMS. [phone]

## 2017-03-17 NOTE — ED Notes (Signed)
Attempted phlebtomy stick for cultures x1, unsuccessful.
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
## 2017-03-17 NOTE — ED Notes (Signed)
Applied a small condom cath to pt.

## 2017-03-18 ENCOUNTER — Inpatient Hospital Stay (HOSPITAL_COMMUNITY): Payer: Medicare Other

## 2017-03-18 DIAGNOSIS — A419 Sepsis, unspecified organism: Secondary | ICD-10-CM

## 2017-03-18 DIAGNOSIS — I10 Essential (primary) hypertension: Secondary | ICD-10-CM

## 2017-03-18 DIAGNOSIS — J9601 Acute respiratory failure with hypoxia: Secondary | ICD-10-CM

## 2017-03-18 DIAGNOSIS — G9340 Encephalopathy, unspecified: Secondary | ICD-10-CM

## 2017-03-18 LAB — BLOOD GAS, ARTERIAL
ACID-BASE DEFICIT: 5.6 mmol/L — AB (ref 0.0–2.0)
Bicarbonate: 18.3 mmol/L — ABNORMAL LOW (ref 20.0–28.0)
Drawn by: 330991
FIO2: 40
MECHVT: 500 mL
O2 SAT: 94.7 %
PATIENT TEMPERATURE: 101
PEEP/CPAP: 5 cmH2O
PH ART: 7.389 (ref 7.350–7.450)
PO2 ART: 83.6 mmHg (ref 83.0–108.0)
RATE: 16 resp/min
pCO2 arterial: 31.4 mmHg — ABNORMAL LOW (ref 32.0–48.0)

## 2017-03-18 LAB — BASIC METABOLIC PANEL
ANION GAP: 9 (ref 5–15)
BUN: 9 mg/dL (ref 6–20)
CHLORIDE: 116 mmol/L — AB (ref 101–111)
CO2: 19 mmol/L — ABNORMAL LOW (ref 22–32)
Calcium: 8 mg/dL — ABNORMAL LOW (ref 8.9–10.3)
Creatinine, Ser: 1.22 mg/dL (ref 0.61–1.24)
GFR calc Af Amer: >60 mL/min (ref >60)
GLUCOSE: 95 mg/dL (ref 65–99)
POTASSIUM: 3.8 mmol/L (ref 3.5–5.1)
SODIUM: 144 mmol/L (ref 135–145)

## 2017-03-18 LAB — CBC
HCT: 41.6 % (ref 39.0–52.0)
HEMOGLOBIN: 13.7 g/dL (ref 13.0–17.0)
MCH: 29.8 pg (ref 26.0–34.0)
MCHC: 32.9 g/dL (ref 30.0–36.0)
MCV: 90.4 fL (ref 78.0–100.0)
PLATELETS: 165 10*3/uL (ref 150–400)
RBC: 4.6 MIL/uL (ref 4.22–5.81)
RDW: 15.2 % (ref 11.5–15.5)
WBC: 11.6 10*3/uL — AB (ref 4.0–10.5)

## 2017-03-18 LAB — GLUCOSE, CAPILLARY
GLUCOSE-CAPILLARY: 112 mg/dL — AB (ref 65–99)
GLUCOSE-CAPILLARY: 117 mg/dL — AB (ref 65–99)
GLUCOSE-CAPILLARY: 126 mg/dL — AB (ref 65–99)
GLUCOSE-CAPILLARY: 139 mg/dL — AB (ref 65–99)
GLUCOSE-CAPILLARY: 92 mg/dL (ref 65–99)
Glucose-Capillary: 90 mg/dL (ref 65–99)
Glucose-Capillary: 96 mg/dL (ref 65–99)

## 2017-03-18 LAB — LACTIC ACID, PLASMA
LACTIC ACID, VENOUS: 1.5 mmol/L (ref 0.5–1.9)
Lactic Acid, Venous: 1.5 mmol/L (ref 0.5–1.9)

## 2017-03-18 LAB — PHOSPHORUS: PHOSPHORUS: 2.1 mg/dL — AB (ref 2.5–4.6)

## 2017-03-18 LAB — MRSA PCR SCREENING: MRSA by PCR: NEGATIVE

## 2017-03-18 LAB — MAGNESIUM: MAGNESIUM: 1.9 mg/dL (ref 1.7–2.4)

## 2017-03-18 LAB — HIV ANTIBODY (ROUTINE TESTING W REFLEX): HIV Screen 4th Generation wRfx: NONREACTIVE

## 2017-03-18 MED ORDER — THIAMINE HCL 100 MG/ML IJ SOLN
100.0000 mg | Freq: Every day | INTRAMUSCULAR | Status: DC
  Administered 2017-03-19: 100 mg via INTRAVENOUS
  Filled 2017-03-18 (×3): qty 1

## 2017-03-18 MED ORDER — SODIUM CHLORIDE 0.9 % IV SOLN
2.0000 g | Freq: Three times a day (TID) | INTRAVENOUS | Status: DC
  Administered 2017-03-18 – 2017-03-19 (×3): 2 g via INTRAVENOUS
  Filled 2017-03-18 (×4): qty 2

## 2017-03-18 MED ORDER — DEXTROSE 5 % IV SOLN
2.0000 g | Freq: Three times a day (TID) | INTRAVENOUS | Status: DC
  Administered 2017-03-18: 2 g via INTRAVENOUS
  Filled 2017-03-18 (×2): qty 2

## 2017-03-18 MED ORDER — ACETAMINOPHEN 160 MG/5ML PO SOLN
650.0000 mg | Freq: Four times a day (QID) | ORAL | Status: DC | PRN
  Administered 2017-03-18: 650 mg
  Filled 2017-03-18: qty 20.3

## 2017-03-18 MED ORDER — FOLIC ACID 1 MG PO TABS
1.0000 mg | ORAL_TABLET | Freq: Every day | ORAL | Status: DC
  Administered 2017-03-18 – 2017-03-24 (×7): 1 mg via ORAL
  Filled 2017-03-18 (×8): qty 1

## 2017-03-18 MED ORDER — LORAZEPAM 2 MG/ML IJ SOLN: 1.0000 mg | Freq: Four times a day (QID) | INTRAMUSCULAR | Status: AC | PRN

## 2017-03-18 MED ORDER — VITAL HIGH PROTEIN PO LIQD: 1000.0000 mL | ORAL | Status: DC

## 2017-03-18 MED ORDER — PRO-STAT SUGAR FREE PO LIQD
30.0000 mL | Freq: Two times a day (BID) | ORAL | Status: DC
Start: 2017-03-18 — End: 2017-03-18

## 2017-03-18 MED ORDER — CHLORHEXIDINE GLUCONATE 0.12% ORAL RINSE (MEDLINE KIT)
15.0000 mL | Freq: Two times a day (BID) | OROMUCOSAL | Status: DC
  Administered 2017-03-18 – 2017-03-20 (×6): 15 mL via OROMUCOSAL

## 2017-03-18 MED ORDER — VITAL HIGH PROTEIN PO LIQD
1000.0000 mL | ORAL | Status: DC
  Administered 2017-03-18: 1000 mL
  Filled 2017-03-18 (×2): qty 1000

## 2017-03-18 MED ORDER — MAGNESIUM SULFATE 2 GM/50ML IV SOLN
2.0000 g | Freq: Once | INTRAVENOUS | Status: AC
  Administered 2017-03-18: 2 g via INTRAVENOUS
  Filled 2017-03-18: qty 50

## 2017-03-18 MED ORDER — VITAMIN B-1 100 MG PO TABS
100.0000 mg | ORAL_TABLET | Freq: Every day | ORAL | Status: DC
  Administered 2017-03-18 – 2017-03-24 (×6): 100 mg via ORAL
  Filled 2017-03-18 (×8): qty 1

## 2017-03-18 MED ORDER — LORAZEPAM 2 MG/ML IJ SOLN
0.0000 mg | Freq: Two times a day (BID) | INTRAMUSCULAR | Status: AC
  Administered 2017-03-20: 1 mg via INTRAVENOUS
  Filled 2017-03-18: qty 1

## 2017-03-18 MED ORDER — ORAL CARE MOUTH RINSE
15.0000 mL | Freq: Four times a day (QID) | OROMUCOSAL | Status: DC
  Administered 2017-03-18 – 2017-03-20 (×6): 15 mL via OROMUCOSAL

## 2017-03-18 MED ORDER — ADULT MULTIVITAMIN W/MINERALS CH
1.0000 | ORAL_TABLET | Freq: Every day | ORAL | Status: DC
  Administered 2017-03-18 – 2017-03-24 (×7): 1 via ORAL
  Filled 2017-03-18 (×8): qty 1

## 2017-03-18 MED ORDER — LEVETIRACETAM 500 MG/5ML IV SOLN
500.0000 mg | Freq: Two times a day (BID) | INTRAVENOUS | Status: DC
  Administered 2017-03-18 – 2017-03-23 (×12): 500 mg via INTRAVENOUS
  Filled 2017-03-18 (×14): qty 5

## 2017-03-18 MED ORDER — LORAZEPAM 1 MG PO TABS: 1.0000 mg | ORAL_TABLET | Freq: Four times a day (QID) | ORAL | Status: AC | PRN

## 2017-03-18 MED ORDER — POTASSIUM PHOSPHATES 15 MMOLE/5ML IV SOLN
30.0000 mmol | Freq: Once | INTRAVENOUS | Status: AC
  Administered 2017-03-18: 30 mmol via INTRAVENOUS
  Filled 2017-03-18: qty 10

## 2017-03-18 MED ORDER — LORAZEPAM 2 MG/ML IJ SOLN
0.0000 mg | Freq: Four times a day (QID) | INTRAMUSCULAR | Status: AC
Start: 2017-03-18 — End: 2017-03-20
  Administered 2017-03-20: 1 mg via INTRAVENOUS
  Filled 2017-03-18: qty 1

## 2017-03-18 MED ORDER — PRO-STAT SUGAR FREE PO LIQD
30.0000 mL | Freq: Three times a day (TID) | ORAL | Status: DC
  Filled 2017-03-18: qty 30

## 2017-03-18 NOTE — H&P (Addendum)
PULMONARY / CRITICAL CARE MEDICINE   Name: Miguel [name] MRN: [id number] DOB: 11-17-1950    ADMISSION DATE:  03/17/2017 CONSULTATION DATE:  August 20  REFERRING MD:  Dr. 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 He also has a history of alcohol abuse and was recently homeless. Most recent seizure was about 6 months ago while living in [place]. The patient moved her with his daughter about 2 months ago and has not taken his medication since that time because he was not having seizures. He was in his usual state of health the morning of 8/20 his daughter left the hotel room where they were staying to run some errands. She came back an hour and a half later and found him unresponsive in the room. EMS was called and upon their arrival he was administered Narcan to which she had a very minimal response and did not respond to subsequent doses of Narcan. He was noted to have urinary incontinence.  Upon arrival to the emergency department he was intubated for airway protection. He was also profoundly hypertensive responding to 10 mg of hydralazine. CT scan of the head and cervical spine were unremarkable for acute change. He was loaded with IV Keppra for presumed seizure. PCCM asked to admit.  SUBJECTIVE:  No events overnight  VITAL SIGNS: BP 104/68   Pulse 81   Temp 98.7 F (37.1 C) (Oral)   Resp 18   Ht 5\' 5"  (1.651 m)   Wt 71.5 kg (157 lb 10.1 oz)   SpO2 97%   BMI 26.23 kg/m   HEMODYNAMICS:    VENTILATOR SETTINGS: Vent Mode: PRVC FiO2 (%):  [40 %-100 %] 40 % Set Rate:  [14 bmp-16 bmp] 16 bmp Vt Set:  [500 mL] 500 mL PEEP:  [5 cmH20] 5 cmH20 Pressure Support:  [15 cmH20] 15 cmH20 Plateau Pressure:  [13 cmH20] 13 cmH20  INTAKE / OUTPUT: I/O last 3 completed shifts: In:  1210.3 [I.V.:1005.3; IV Piggyback:205] Out: 1635 [Urine:1635]  PHYSICAL EXAMINATION: General:  Thin elderly African-American male on ventilator, chronically ill appearing Neuro:  Sedate but moving all ext to command HEENT:  Normocephalic atraumatic, PERRL, no appreciable JVD Cardiovascular:  RRR, no MRG Lungs:  Clear bilateral breath sounds Abdomen:  Soft, NT, Nd and +BS Musculoskeletal:  No acute deformity Skin:  Grossly intact  LABS:  BMET  Recent Labs Lab 03/17/17 1641 03/18/17 0328  NA 142 144  K 3.0* 3.8  CL 111 116*  CO2 21* 19*  BUN 11 9  CREATININE 1.77* 1.22  GLUCOSE 136* 95    Electrolytes  Recent Labs Lab 03/17/17 1641 03/17/17 2009 03/18/17 0328  CALCIUM 8.7*  --  8.0*  MG  --  2.2 1.9  PHOS  --  3.2 2.1*    CBC  Recent Labs Lab 03/17/17 1641 03/18/17 0328  WBC 19.4* 11.6*  HGB 16.0 13.7  HCT 47.4 41.6  PLT 206 165    Coag's  Recent Labs Lab 03/17/17 1641  APTT 27  INR 0.99    Sepsis Markers  Recent Labs Lab 03/17/17 2009 03/17/17 2026 03/18/17 0619  LATICACIDVEN  --  3.93* 1.5  PROCALCITON 2.67  --   --     ABG  Recent Labs Lab 03/17/17 2004 03/18/17 0505  PHART 7.334* 7.389  PCO2ART 37.7  31.4*  PO2ART 165.0* 83.6    Liver Enzymes  Recent Labs Lab 03/17/17 1641  AST 31  ALT 16*  ALKPHOS 71  BILITOT 0.6  ALBUMIN 3.8    Cardiac Enzymes No results for input(s): TROPONINI, PROBNP in the last 168 hours.  Glucose  Recent Labs Lab 03/18/17 0016 03/18/17 0330 03/18/17 0724  GLUCAP 90 96 126*    Imaging Ct Head Wo Contrast  Result Date: 03/17/2017 CLINICAL DATA:  Altered mental status EXAM: CT HEAD WITHOUT CONTRAST CT CERVICAL SPINE WITHOUT CONTRAST TECHNIQUE: Multidetector CT imaging of the head and cervical spine was performed following the standard protocol without intravenous contrast. Multiplanar CT image reconstructions of the cervical spine were also generated. COMPARISON:  Head CT 09/29/2015  FINDINGS: CT HEAD FINDINGS Brain: Right parietal approach shunt catheter tip adjacent to the septum pellucidum is unchanged in position. The size and configuration of the dilated ventricles are unchanged. Bifrontal encephalomalacia in a pattern consistent with remote trauma is unchanged. Area of extra-axial calcification at the right frontal pole is unchanged. No acute hemorrhage. No midline shift or other mass effect. Vascular: No hyperdense vessel or unexpected calcification. Skull: Right parietal burr hole.  No calvarial fracture. Sinuses/Orbits: Extensive opacification of the paranasal sinuses, nasopharynx and upper oropharynx, likely due at least in part to the presence of endotracheal tube. Normal orbits. CT CERVICAL SPINE FINDINGS Alignment: No static subluxation. Facets are aligned. Occipital condyles are normally positioned. Skull base and vertebrae: No acute fracture.  C3-C5 fusion. Soft tissues and spinal canal: No prevertebral fluid or swelling. No visible canal hematoma. Disc levels: No advanced spinal canal or neural foraminal stenosis. Upper chest: Biapical emphysema. Other: Normal visualized paraspinal cervical soft tissues. IMPRESSION: 1. Unchanged size and configuration of the shunted ventricles and unchanged bifrontal encephalomalacia. 2. No acute intracranial abnormality. 3. No acute fracture or static subluxation of the cervical spine. Electronically Signed   By: Deatra Robinson M.D.   On: 03/17/2017 17:36   Ct Cervical Spine Wo Contrast  Result Date: 03/17/2017 CLINICAL DATA:  Altered mental status EXAM: CT HEAD WITHOUT CONTRAST CT CERVICAL SPINE WITHOUT CONTRAST TECHNIQUE: Multidetector CT imaging of the head and cervical spine was performed following the standard protocol without intravenous contrast. Multiplanar CT image reconstructions of the cervical spine were also generated. COMPARISON:  Head CT 09/29/2015 FINDINGS: CT HEAD FINDINGS Brain: Right parietal approach shunt catheter tip  adjacent to the septum pellucidum is unchanged in position. The size and configuration of the dilated ventricles are unchanged. Bifrontal encephalomalacia in a pattern consistent with remote trauma is unchanged. Area of extra-axial calcification at the right frontal pole is unchanged. No acute hemorrhage. No midline shift or other mass effect. Vascular: No hyperdense vessel or unexpected calcification. Skull: Right parietal burr hole.  No calvarial fracture. Sinuses/Orbits: Extensive opacification of the paranasal sinuses, nasopharynx and upper oropharynx, likely due at least in part to the presence of endotracheal tube. Normal orbits. CT CERVICAL SPINE FINDINGS Alignment: No static subluxation. Facets are aligned. Occipital condyles are normally positioned. Skull base and vertebrae: No acute fracture.  C3-C5 fusion. Soft tissues and spinal canal: No prevertebral fluid or swelling. No visible canal hematoma. Disc levels: No advanced spinal canal or neural foraminal stenosis. Upper chest: Biapical emphysema. Other: Normal visualized paraspinal cervical soft tissues. IMPRESSION: 1. Unchanged size and configuration of the shunted ventricles and unchanged bifrontal encephalomalacia. 2. No acute intracranial abnormality. 3. No acute fracture or static subluxation of the cervical spine. Electronically Signed   By: Caryn Bee  Chase Picket M.D.   On: 03/17/2017 17:36   Dg Chest Port 1 View  Result Date: 03/18/2017 CLINICAL DATA:  Intubation. EXAM: PORTABLE CHEST 1 VIEW COMPARISON:  03/17/2017. FINDINGS: Interim advancement of NG tube, its tip and side hole below the left hemidiaphragm. Endotracheal tube, right IJ line in stable position. Heart size stable. Atelectatic changes left lower lobe. Tiny left pleural effusion. No pneumothorax. IMPRESSION: 1. Interim advancement of NG tube, its tip and side hole below the left hemidiaphragm. Endotracheal tube and right IJ line stable position. 2. Atelectatic changes left lower lobe.  Small left pleural effusion. 3.  No evidence of pulmonary venous congestion on today's exam. Electronically Signed   By: Maisie Fus  Register   On: 03/18/2017 06:25   Dg Chest Port 1 View  Result Date: 03/17/2017 CLINICAL DATA:  Intubated patient. Current smoker. A history of atrial fibrillation, seizure activity, alcohol abuse. EXAM: PORTABLE CHEST 1 VIEW COMPARISON:  Chest x-[place] of March 30, 2016 FINDINGS: The lungs are well-expanded. There is no focal infiltrate. The interstitial markings are mildly prominent but not greatly changed from the previous study. The heart is normal in size. The pulmonary vascularity is mildly prominent centrally. The endotracheal tube tip lies approximately 4.7 cm above the carina. The esophagogastric tube tip projects at the GE junction with the proximal port in the distal esophagus. The right internal jugular venous catheter tip projects at the cavoatrial junction. IMPRESSION: Mild pulmonary interstitial prominence and central pulmonary vascular congestion without significant cardiomegaly. This may reflect the patient's volume status. Advancement of the nasogastric tube by approximately 15 cm is needed to assure that both the proximal port and tip lie below the GE junction. Withdrawal of the right internal jugular venous catheter by approximately 5 cm would assure that it does not enter the atrium. Thoracic aortic atherosclerosis. Electronically Signed   By: David  [place] M.D.   On: 03/17/2017 16:10   Dg Abd Portable 1v  Result Date: 03/18/2017 CLINICAL DATA:  Encounter for orogastric tube placement EXAM: PORTABLE ABDOMEN - 1 VIEW COMPARISON:  None. FINDINGS: The tip and side port of a gastric tube are seen in the left upper quadrant of the abdomen in the expected location of the stomach. A ventriculoperitoneal shunt tip is seen in the left hemiabdomen as well. There is an IVC filter, right paraspinal in location spanning the superior endplate of L3 through inferior endplate  of L4. No radio-opaque calculi or other significant radiographic abnormality are seen. Mild probe projects over the expected location of the bladder. Bowel gas pattern is unremarkable. No free air. IMPRESSION: Orogastric tube in the expected location of the stomach. Unremarkable bowel gas pattern. Electronically Signed   By: Tollie Eth M.D.   On: 03/18/2017 00:09     STUDIES:  CT head/C-spine 8/20 > Unchanged size and configuration of the shunted ventricles and unchanged bifrontal encephalomalacia. No acute intracranial abnormality. No acute fracture or static subluxation of the cervical spine.  CULTURES: Blood 8/20 > Urine 8/20>  Tracheal asp 8/20 >  ANTIBIOTICS: Ceftriaxone 8/20>>>8/21 Merrem 8/21>>> Vancomycin 8/20>>>  SIGNIFICANT EVENTS: 8/20 admit  LINES/TUBES: ETT 8/20>>> L IJ TLC 8/20>>>  DISCUSSION: 66 year old male with history of closed head injury with VP shunt in place and known seizure disorder was found down 8/20. Had not been taking seizure meds. Intubated in ED and treated with Keppra for presumed seizure. Admitted to ICU with neurology consultation.   ASSESSMENT / PLAN:  PULMONARY A: Inability to protect airway secondary to AMS,  presumed seizure  P:   Will hold propofol for ?SBT today ABG in AM if still intubated CXR in AM if still intubated VAP bundle  CARDIOVASCULAR A:  Hypertensive urgency (improved) PAF  P:  ICU monitoring PRN hydralazine  RENAL A:   AKI Hypokalemia, phos and mag  P:   Hydrate NS at 75 ml/hr Replace electrolytes as indicated AM BMP  GASTROINTESTINAL A:   No acute issues  P:   NPO Protonix Consult nutrition for TF as per nutrition  HEMATOLOGIC A:   No acute issues  P:  Follow CBC SQ heparin  INFECTIOUS A:   Doubt meningitis  P:   Pan culture f/u D/C cefepime Start merrem for seizure threshold  ENDOCRINE A:   No acute issues   P:   Follow glucose on chemistry  NEUROLOGIC A:   Presumed  seizure with history of same H/o closed head injury with VP shunt in place H/o ETOH abuse, no use for past month per daughter  P:   RASS goal: -1 to -2 Keppra load in ED Neuro input appreciated, deferring to neurosurgery to interrogate shunt, will ask neurosurgery to evaluate MRI and EEG of the brain Post MRI will consider d/c of propofol to evaluate for weaning PRN fentanyl  Thiamine Folate CIWA protocol with ativan ordered  FAMILY  - Updates: Family updated bedside  - Inter-disciplinary family meet or Palliative Care meeting due by:  8/20  The patient is critically ill with multiple organ systems failure and requires high complexity decision making for assessment and support, frequent evaluation and titration of therapies, application of advanced monitoring technologies and extensive interpretation of multiple databases.   Critical Care Time devoted to patient care services described in this note is  35  Minutes. This time reflects time of care of this signee Dr Koren Bound. This critical care time does not reflect procedure time, or teaching time or supervisory time of PA/NP/Med student/Med Resident etc but could involve care discussion time.  Alyson Reedy, M.D. [facility] Pulmonary/Critical Care Medicine. Pager: [phone]. After hours pager: [phone].  03/18/2017 8:57 AM

## 2017-03-18 NOTE — Progress Notes (Signed)
Patient transported back to 2M14 from radiology department with SWOT RN and RT. Patient stable on arrival.

## 2017-03-18 NOTE — Progress Notes (Signed)
EEG Completed; Results Pending  
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
## 2017-03-18 NOTE — Progress Notes (Addendum)
Initial Nutrition Assessment  DOCUMENTATION CODES:   Not applicable  INTERVENTION:    Initiate Vital High Protein at goal rate of 50 ml/h (1200 ml per day)   TF regimen + Propofol to provide 1775 kcals, 105 gm protein, 1003 ml of free water  NUTRITION DIAGNOSIS:   Inadequate oral intake related to inability to eat as evidenced by NPO status.  GOAL:   Patient will meet greater than or equal to 90% of their needs  MONITOR:   Vent status, TF tolerance, Labs, Weight trends, Skin, I & O's  REASON FOR ASSESSMENT:   Consult Enteral/tube feeding initiation and management  ASSESSMENT:   66 y.o. Male with PMH of seizures and closed head injury with VP shunt; admitted with sepsis and possible pneumonia with concern for meningis with VP shunt.  Patient is currently intubated on ventilator support >> OGT in place MV: 6.4 L/min Temp (24hrs), Avg:97.9 F (36.6 C), Min:95.5 F (35.3 C), Max:101 F (38.3 C)  Propofol: 21.8 ml/hr >> 575 lipid kcal  Pt found unresponsive in hotel room by daughter after she left to run errands. PCCM note reviewed. Pt had not been taking his seizure medications. Intubated in ED given AMS, presumed seizure. IV Keppra given. Medications reviewed and include MVI, folvite and thiamine. Labs reviewed. Phos 2.1 (L).  CBG's 96-126-112.  Unable to complete Nutrition-Focused physical exam at this time.   Diet Order:  Diet NPO time specified  Skin:  Reviewed, no issues  Last BM:  8/20  Height:   Ht Readings from Last 1 Encounters:  03/17/17 5\' 5"  (1.651 m)    Weight:   Wt Readings from Last 1 Encounters:  03/18/17 157 lb 10.1 oz (71.5 kg)    Ideal Body Weight:  56.8 kg  BMI:  Body mass index is 26.23 kg/m.  Estimated Nutritional Needs:   Kcal:  1751  Protein:  105-120 gm  Fluid:  per MD  EDUCATION NEEDS:   No education needs identified at this time  Maureen Chatters, RD, LDN Pager #: [phone] After-Hours Pager #: [phone]

## 2017-03-18 NOTE — Progress Notes (Signed)
eLink Physician-Brief Progress Note Patient Name: DANEK WIEDERKEHR DOB: 1951-07-19 MRN: [id number]   Date of Service  03/18/2017  HPI/Events of Note  Fever to 101.0 F - Patient already pan cultured on admission and started on Vancomycin and Ceftriaxone. AST and ALT both normal.   eICU Interventions  Will order: 1. Tylenol liquid 650 mg per tube Q 6 hours PRN Temp > 100.5.      Intervention Category Major Interventions: Infection - evaluation and management  Bowe Sidor Eugene 03/18/2017, 4:26 AM

## 2017-03-18 NOTE — Progress Notes (Signed)
Pharmacy Antibiotic Note  JR KOVACICH is a 66 y.o. male admitted on 03/17/2017 with sepsis.  Pharmacy has been consulted for Cefepime dosing. VP shunt infection as possible source. Changing from Ceftriaxone to Cefepime. CrCl ~50-55.   Plan: -Already on vancomycin  -Cefepime 2g IV q8h -Trend WBC, temp, renal function  -F/U infectious work-up -Drug levels as indicated   Height: 5\' 5"  (165.1 cm) Weight: 153 lb 14.1 oz (69.8 kg) IBW/kg (Calculated) : 61.5  Temp (24hrs), Avg:97.9 F (36.6 C), Min:95.5 F (35.3 C), Max:101 F (38.3 C)   Recent Labs Lab 03/17/17 1641 03/17/17 2026 03/18/17 0328  WBC 19.4*  --  11.6*  CREATININE 1.77*  --  1.22  LATICACIDVEN  --  3.93*  --     Estimated Creatinine Clearance: 52.5 mL/min (by C-G formula based on SCr of 1.22 mg/dL).    No Known Allergies  Abran Duke 03/18/2017 4:49 AM

## 2017-03-18 NOTE — Progress Notes (Signed)
Subjective: Intubated and sedated with no clinical seizures noted.   Exam: Vitals:   03/18/17 0930 03/18/17 1000  BP: 134/74 136/76  Pulse: 78 79  Resp: 16 11  Temp:    SpO2: 98% 100%    HEENT-  Normocephalic, no lesions, without obvious abnormality.  Normal external eye and conjunctiva.  Normal TM's bilaterally.  Normal auditory canals and external ears. Normal external nose, mucus membranes and septum.  Normal pharynx.    Neuro: Intubated and sedated on propofol CN: Pupils are equal and round. They are symmetrically reactive from 3-->2 mm. EOMI without nystagmus. Facial sensation is intact to light touch. Face is symmetric at rest with normal strength and mobility. Hearing is intact to conversational voice. Palate elevates symmetrically and uvula is midline. Voice is normal in tone, pitch and quality. Bilateral SCM and trapezii are 5/5. Tongue is midline with normal bulk and mobility.  Motor: withdrawls antigravity from noxious stimuli in all 4 extremities  Sensation: withdraws from noxious stimuli.   Pertinent Labs/Diagnostics: EEG pending MRI brain pending  Felicie Morn PA-C [facility] [phone]  Impression:  --DDx for unresponsiveness includes seizure with postictal state (medication noncompliance versus alcohol withdrawal), anoxic brain injury, fall with LOC, drug intoxication --Medication noncompliance. Increases the likelihood of seizure as the underlying etiology for his presentation --Overall, most likely component of DDx for patient's presentation is unwitnessed seizure with postictal state given elevated CK and lactate, history of seizures and noncompliance with anticonvulsant regimen  Recommendations: 1) MRI and EEG pending 2) agree with Consider shunt interrogation with CSF sample from shunt to be sent to lab. Will defer to Neurosurgery regarding final decision to interrogate shunt. 3) CIWA 4) Continue Keppra 500 mg BID  03/18/2017, 11:13  AM   NEUROHOSPITALIST ADDENDUM Pt. Seen and examined. Agree with note as documented above by Andres Labrum, PA-C. Please call with questions.  Milon Dikes, MD [facility] [phone]  If 7pm to 7am, please call on call as listed on AMION.

## 2017-03-18 NOTE — Clinical Social Work Note (Signed)
Clinical Social Work Assessment  Patient Details  Name: Miguel [name] MRN: [id number] Date of Birth: 1950-11-13  Date of referral:  03/18/17               Reason for consult:  Housing Concerns/Homelessness                Permission sought to share information with:  Family Supports Permission granted to share information::  Yes, Verbal Permission Granted  Name::     [name] (step daughter of pt. )  Agency::     Relationship::     Contact Information:     Housing/Transportation Living arrangements for the past 2 months:  Hotel/Motel (with step daughter ) Source of Information:  Adult Children Patient Interpreter Needed:  None Criminal Activity/Legal Involvement Pertinent to Current Situation/Hospitalization:  No - Comment as needed Significant Relationships:  Adult Children Lives with:  Adult Children Do you feel safe going back to the place where you live?  Yes Need for family participation in patient care:  Yes (Comment)  Care giving concerns:  CSW was asked to speak with pt's step daughter [name]) at bedside to discuss concerns. At this time [name] is worried about pt and what pt's needs may be after discharged from the hospital. [name] mentions that she and pt are homeless.    Social Worker assessment / plan:  CSW spoke with pt's step daughter at bedside. During this time CSW was informed that pt and step daughter have been living in a motel for a few months. Pt's step daughter reports that she found pt in [place] one day while driving with her brother and brought pt back to [place] to stay with her. She mentions that since then she has fell on hard times and is living in a motel since the death of her mother. Pt and step daughter do have supports from pt's brother and wife however they are said to be unable to care for pt at this time due to caring for another relative.   Pt's step daughter sought resources for food, therefore CSW provided her with the little green book  and the little blue book to help with food resources.   Employment status:  Retired Health and safety inspector:  Medicare PT Recommendations:  Not assessed at this time Information / Referral to community resources:     Patient/Family's Response to care:  Pt's step daughter is understanding and agreeable to plan of care at this time.   Patient/Family's Understanding of and Emotional Response to Diagnosis, Current Treatment, and Prognosis:  No further questions or concerns have been presented at this time to CSW.   Emotional Assessment Appearance:  Appears stated age Attitude/Demeanor/Rapport:  Unable to Assess (pt is intubated) Affect (typically observed):  Unable to Assess (pt currently intubated. ) Orientation:   (pt is currently intubated. ) Alcohol / Substance use:  Not Applicable Psych involvement (Current and /or in the community):  No (Comment)  Discharge Needs  Concerns to be addressed:  Basic Needs, Homelessness Readmission within the last 30 days:  No Current discharge risk:  Homeless Barriers to Discharge:  Homeless with medical needs   Robb Matar, LCSWA 03/18/2017, 11:08 AM

## 2017-03-18 NOTE — Progress Notes (Signed)
Pharmacy Antibiotic Note  Miguel [name] is a 66 y.o. 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  Pharmacy has been consulted for meropenem dosing. Patient is also currently receiving vancomycin. Both ceftriaxone and cefepime have been discontinued. The patient also has a history of seizure disorder, with a possible seizure before admission so there was concern for seizure with cefepime. Meropenem added as there is adequate coverage to the CNS and also has a lower risk of seizure. WBC count and lactic acid  is trending down. Serum creatinine is trending down and has good renal output at this time. Cultures are pending at this time.   Plan: -Meropenem 2g q8 hours  -Continue vancomycin 750mg  q12 hours  -F/U infectious work-up -Drug levels as indicated   Height: 5\' 5"  (165.1 cm) Weight: 157 lb 10.1 oz (71.5 kg) IBW/kg (Calculated) : 61.5  Temp (24hrs), Avg:97.9 F (36.6 C), Min:95.5 F (35.3 C), Max:101 F (38.3 C)   Recent Labs Lab 03/17/17 1641 03/17/17 2026 03/18/17 0328 03/18/17 0619  WBC 19.4*  --  11.6*  --   CREATININE 1.77*  --  1.22  --   LATICACIDVEN  --  3.93*  --  1.5    Estimated Creatinine Clearance: 52.5 mL/min (by C-G formula based on SCr of 1.22 mg/dL).    No Known Allergies   Vanc 8/20>> Cefepime 8/20>>8/21 Ceftriaxone  8/20 x 1 dose Meropenem 8/21  8/20 MRSA PCR: negative  8/20 BCx: pending  8/20 UCx: pending    Blake Divine, Pharm.D. PGY1 Pharmacy Resident 03/18/2017 9:26 AM Main Pharmacy: [phone]

## 2017-03-18 NOTE — Progress Notes (Signed)
Patient transported to radiology department for MRI assisted by RT, RN, and transport. Patient stable on arrival and placed on MRI compatible monitor, vent, and IV.

## 2017-03-19 ENCOUNTER — Inpatient Hospital Stay (HOSPITAL_COMMUNITY): Payer: Medicare Other

## 2017-03-19 DIAGNOSIS — G40901 Epilepsy, unspecified, not intractable, with status epilepticus: Secondary | ICD-10-CM

## 2017-03-19 LAB — GLUCOSE, CAPILLARY
GLUCOSE-CAPILLARY: 103 mg/dL — AB (ref 65–99)
GLUCOSE-CAPILLARY: 118 mg/dL — AB (ref 65–99)
GLUCOSE-CAPILLARY: 132 mg/dL — AB (ref 65–99)
GLUCOSE-CAPILLARY: 99 mg/dL (ref 65–99)
Glucose-Capillary: 140 mg/dL — ABNORMAL HIGH (ref 65–99)
Glucose-Capillary: 96 mg/dL (ref 65–99)

## 2017-03-19 LAB — BLOOD GAS, ARTERIAL
ACID-BASE DEFICIT: 2.1 mmol/L — AB (ref 0.0–2.0)
BICARBONATE: 21.3 mmol/L (ref 20.0–28.0)
Drawn by: 249101
FIO2: 40
LHR: 16 {breaths}/min
O2 SAT: 97.1 %
PATIENT TEMPERATURE: 98.6
PEEP/CPAP: 5 cmH2O
PH ART: 7.445 (ref 7.350–7.450)
VT: 500 mL
pCO2 arterial: 31.5 mmHg — ABNORMAL LOW (ref 32.0–48.0)
pO2, Arterial: 91.4 mmHg (ref 83.0–108.0)

## 2017-03-19 LAB — BASIC METABOLIC PANEL
ANION GAP: 7 (ref 5–15)
BUN: 7 mg/dL (ref 6–20)
CHLORIDE: 113 mmol/L — AB (ref 101–111)
CO2: 21 mmol/L — ABNORMAL LOW (ref 22–32)
Calcium: 8.1 mg/dL — ABNORMAL LOW (ref 8.9–10.3)
Creatinine, Ser: 1.05 mg/dL (ref 0.61–1.24)
GFR calc Af Amer: >60 mL/min (ref >60)
GLUCOSE: 114 mg/dL — AB (ref 65–99)
POTASSIUM: 4.1 mmol/L (ref 3.5–5.1)
SODIUM: 141 mmol/L (ref 135–145)

## 2017-03-19 LAB — MAGNESIUM: Magnesium: 2.3 mg/dL (ref 1.7–2.4)

## 2017-03-19 LAB — CBC
HEMATOCRIT: 40.2 % (ref 39.0–52.0)
HEMOGLOBIN: 13.4 g/dL (ref 13.0–17.0)
MCH: 30.5 pg (ref 26.0–34.0)
MCHC: 33.3 g/dL (ref 30.0–36.0)
MCV: 91.4 fL (ref 78.0–100.0)
Platelets: 160 10*3/uL (ref 150–400)
RBC: 4.4 MIL/uL (ref 4.22–5.81)
RDW: 15.8 % — ABNORMAL HIGH (ref 11.5–15.5)
WBC: 12.5 10*3/uL — AB (ref 4.0–10.5)

## 2017-03-19 LAB — PHOSPHORUS: PHOSPHORUS: 3.3 mg/dL (ref 2.5–4.6)

## 2017-03-19 MED ORDER — HYDRALAZINE HCL 20 MG/ML IJ SOLN
10.0000 mg | INTRAMUSCULAR | Status: DC | PRN
  Administered 2017-03-19 – 2017-03-22 (×4): 10 mg via INTRAVENOUS
  Filled 2017-03-19 (×4): qty 1

## 2017-03-19 MED ORDER — SODIUM CHLORIDE 0.9 % IV SOLN
INTRAVENOUS | Status: DC
Start: 2017-03-19 — End: 2017-03-24
  Administered 2017-03-19 – 2017-03-24 (×3): via INTRAVENOUS

## 2017-03-19 NOTE — Progress Notes (Addendum)
Subjective: Sedated with no clinical seizure  Exam: Vitals:   03/19/17 0630 03/19/17 0804  BP: 110/71 108/63  Pulse: 86 84  Resp: 16 16  Temp:    SpO2: 96% 97%    HEENT-  Normocephalic, no lesions, without obvious abnormality.  Normal external eye and conjunctiva.  Normal TM's bilaterally.  Normal auditory canals and external ears. Normal external nose, mucus membranes and septum.  Normal pharynx. Cardiovascular- irregularly irregular rhythm, pulses palpable throughout   Lungs- chest clear, no wheezing, rales, normal symmetric air entry, Heart exam - S1, S2 normal, no murmur, no gallop, rate regular Abdomen- normal findings: bowel sounds normal Extremities- no edema Lymph-no adenopathy palpable Musculoskeletal-no joint tenderness, deformity or swelling Skin-warm and dry, no hyperpigmentation, vitiligo, or suspicious lesions   Neuro:  CN: Pupils are equal and round. They are symmetrically reactive from 3-->2 mm. EOMI without nystagmus. Facial sensation is intact to light touch. Face is symmetric at rest . Motor: moving all extremities antigravity to pain.  Sensation: Intact .     Pertinent Labs/Diagnostics: EEG Impression: This sedated EEG is abnormal due to moderate diffuse background slowing and suppression.  MRI IMPRESSION: 4 mm area of restricted diffusion RIGHT paramedian pons consistent with acute nonhemorrhagic infarction  Felicie Morn PA-C [facility] [phone]  Impression: Overall, most likely component of DDx for patient's presentation is unwitnessed seizure with postictal state given elevated CK and lactate, history of seizures and noncompliance with anticonvulsant regimen.  Punctate infarct seen on MRI would not explain his clinical picture. EEG showed no epileptiform activity at this point.    Recommendations: 1) Continue Keppra and CIWA 2) Wean off sedation if PCCM feels safe  03/19/2017, 9:46 AM  Attending addendum Patient seen and  examined Agree with the recommendations above. Case also discussed with Dr. Pearlean Brownie in stroke given the abnormal MRI finding. It is unlikely that that small vessel stroke, which is very small in size as well, is responsible for patient's altered mentation.  We will be available as needed. Please call with questions.  Milon Dikes, MD [facility] [phone]  If 7pm to 7am, please call on call as listed on AMION.

## 2017-03-19 NOTE — Progress Notes (Signed)
eLink Physician-Brief Progress Note Patient Name: JADA LEZA DOB: [birth year] MRN: [id number]   Date of Service  03/19/2017  HPI/Events of Note  Agitation - Request to renew bilateral soft wrist restraints.    eICU Interventions  Will renew bilateral soft wrist restraints.      Intervention Category Minor Interventions: Agitation / anxiety - evaluation and management  Junita Kubota Eugene 03/19/2017, 12:12 AM

## 2017-03-19 NOTE — Procedures (Signed)
ELECTROENCEPHALOGRAM REPORT  Date of Study: 03/18/2017 (assigned to this reader on 03/19/2017)  Patient's Name: Miguel [name] MRN: [id number] Date of Birth: Dec 23, 1950  Referring Provider: Dr. Kerney Elbe  Clinical History: This is a 66 year old man with seizures, found unresponsive  Medications: levETIRAcetam (KEPPRA) 500 mg in sodium chloride 0.9 % 100 mL IVPB  propofol (DIPRIVAN) 1000 MG/100ML infusion  acetaminophen (TYLENOL) solution 650 mg  chlorhexidine gluconate (MEDLINE KIT) (PERIDEX) 0.12 % solution 15 mL  feeding supplement (VITAL HIGH PROTEIN) liquid 4,037 mL  folic acid (FOLVITE) tablet 1 mg  heparin injection 5,000 Units  meropenem (MERREM) 2 g in sodium chloride 0.9 % 100 mL IVPB  multivitamin with minerals tablet 1 tablet  pantoprazole (PROTONIX) injection 40 mg  potassium PHOSPHATE 30 mmol in dextrose 5 % 500 mL infusion  thiamine (B-1) injection 100 mg  vancomycin (VANCOCIN) IVPB 750 mg/150 ml premix   Technical Summary: A multichannel digital EEG recording measured by the international 10-20 system with electrodes applied with paste and impedances below 5000 ohms performed as portable with EKG monitoring in an intubated and sedated patient.  Hyperventilation and photic stimulation were not performed.  The digital EEG was referentially recorded, reformatted, and digitally filtered in a variety of bipolar and referential montages for optimal display.   Description: The patient is intubated and sedated on Propofol during the recording. There is no clear posterior dominant rhythm. At the beginning of the recording, the background consists of a large amount of diffuse low voltage delta slowing. As the study progresses, the background shows diffuse suppression admixed with 4-6 Hz theta and 2-3 Hz delta slowing. No asymmetry noted. Normal sleep architecture was not seen. With stimulation, there is slight increase in muscle artifact. Hyperventilation and photic stimulation  were not performed.  There were no epileptiform discharges or electrographic seizures seen.    EKG lead was unremarkable.  Impression: This sedated EEG is abnormal due to moderate diffuse background slowing and suppression.  Clinical Correlation of the above findings indicates diffuse cerebral dysfunction that is non-specific in etiology and can be seen with hypoxic/ischemic injury, toxic/metabolic encephalopathies, or medication effect from Propofol. There were no electrographic seizures in this study. Clinical correlation is advised.   Ellouise Newer, M.D.

## 2017-03-19 NOTE — Progress Notes (Signed)
SLP Cancellation Note  Patient Details Name: Miguel [name] MRN: [id number] DOB: 04/27/1951   Cancelled treatment:       Reason Eval/Treat Not Completed: Patient not medically ready; Swallow evaluation deferred to next date as RN reports pt not ready, with frequent agitation and removing nasal cannula with decreased respiratory support. Will follow up next date for readiness   Marcene Duos MA, CCC-SLP Acute Care Speech Language Pathologist    Miguel [name] 03/19/2017, 3:26 PM

## 2017-03-19 NOTE — Progress Notes (Signed)
PULMONARY / CRITICAL CARE MEDICINE   Name: Miguel [name] MRN: [id number] DOB: 10-19-1950    ADMISSION DATE:  03/17/2017 CONSULTATION DATE:  August 20  REFERRING MD:  Dr. 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 He also has a history of alcohol abuse and was recently homeless. Most recent seizure was about 6 months ago while living in [place]. The patient moved her with his daughter about 2 months ago and has not taken his medication since that time because he was not having seizures. He was in his usual state of health the morning of 8/20 his daughter left the hotel room where they were staying to run some errands. She came back an hour and a half later and found him unresponsive in the room. EMS was called and upon their arrival he was administered Narcan to which she had a very minimal response and did not respond to subsequent doses of Narcan. He was noted to have urinary incontinence.  Upon arrival to the emergency department he was intubated for airway protection. He was also profoundly hypertensive responding to 10 mg of hydralazine. CT scan of the head and cervical spine were unremarkable for acute change. He was loaded with IV Keppra for presumed seizure. PCCM asked to admit.  SUBJECTIVE:  No events overnight  VITAL SIGNS: BP (!) 169/80   Pulse 76   Temp 99.9 F (37.7 C) (Oral)   Resp 10   Ht 5\' 5"  (1.651 m)   Wt 72.3 kg (159 lb 6.3 oz)   SpO2 99%   BMI 26.52 kg/m   HEMODYNAMICS:    VENTILATOR SETTINGS: Vent Mode: CPAP;PSV FiO2 (%):  [30 %-40 %] 30 % Set Rate:  [16 bmp] 16 bmp Vt Set:  [500 mL] 500 mL PEEP:  [5 cmH20] 5 cmH20 Pressure Support:  [5 cmH20] 5 cmH20 Plateau Pressure:  [12 cmH20] 12 cmH20  INTAKE / OUTPUT: I/O last 3 completed shifts: In:  5328.3 [I.V.:3173.3; NG/GT:685; IV Piggyback:1470] Out: 2435 [Urine:2335; Emesis/NG output:100]  PHYSICAL EXAMINATION: General:  Thin elderly African-American male on ventilator, chronically ill appearing Neuro:  Awake and following command HEENT:  Normocephalic atraumatic, PERRL, no appreciable JVD Cardiovascular:  RRR, no MRG Lungs:  Clear bilateral breath sounds Abdomen:  Soft, NT, Nd and +BS Musculoskeletal:  No acute deformity Skin:  Grossly intact  LABS:  BMET  Recent Labs Lab 03/17/17 1641 03/18/17 0328 03/19/17 0250  NA 142 144 141  K 3.0* 3.8 4.1  CL 111 116* 113*  CO2 21* 19* 21*  BUN 11 9 7   CREATININE 1.77* 1.22 1.05  GLUCOSE 136* 95 114*    Electrolytes  Recent Labs Lab 03/17/17 1641 03/17/17 2009 03/18/17 0328 03/19/17 0250  CALCIUM 8.7*  --  8.0* 8.1*  MG  --  2.2 1.9 2.3  PHOS  --  3.2 2.1* 3.3    CBC  Recent Labs Lab 03/17/17 1641 03/18/17 0328 03/19/17 0250  WBC 19.4* 11.6* 12.5*  HGB 16.0 13.7 13.4  HCT 47.4 41.6 40.2  PLT 206 165 160    Coag's  Recent Labs Lab 03/17/17 1641  APTT 27  INR 0.99    Sepsis Markers  Recent Labs Lab 03/17/17 2009 03/17/17 2026 03/18/17 0619 03/18/17 0943  LATICACIDVEN  --  3.93* 1.5 1.5  PROCALCITON 2.67  --   --   --  ABG  Recent Labs Lab 03/17/17 2004 03/18/17 0505 03/19/17 0427  PHART 7.334* 7.389 7.445  PCO2ART 37.7 31.4* 31.5*  PO2ART 165.0* 83.6 91.4    Liver Enzymes  Recent Labs Lab 03/17/17 1641  AST 31  ALT 16*  ALKPHOS 71  BILITOT 0.6  ALBUMIN 3.8    Cardiac Enzymes No results for input(s): TROPONINI, PROBNP in the last 168 hours.  Glucose  Recent Labs Lab 03/18/17 1635 03/18/17 2028 03/18/17 2353 03/19/17 0318 03/19/17 0847 03/19/17 1119  GLUCAP 92 139* 117* 132* 118* 140*    Imaging Mr Brain Wo Contrast  Result Date: 03/18/2017 CLINICAL DATA:  66 year old male with past medical history of closed head injury requiring VP shunt, and  history of seizures, was found unresponsive 03/17/2017. EXAM: MRI HEAD WITHOUT CONTRAST TECHNIQUE: Multiplanar, multiecho pulse sequences of the brain and surrounding structures were obtained without intravenous contrast. COMPARISON:  CT head 03/17/2017.  MR head 10/14/2016. FINDINGS: Significant degradation of portions of the image due to metallic artifact from the patient's VP shunt. Brain: 4 mm area of restricted diffusion, RIGHT paramedian pons, low ADC, not present previously, consistent with acute infarction. This cannot be correlated with coronal images due to signal loss related to the metallic shunt, but is felt to be a real finding. Chronic BILATERAL lacunar infarcts, most notable LEFT thalamus and LEFT basal ganglia. No acute hemorrhage, or intra-axial mass lesion. Hydrocephalus ex vacuo. Shunt catheter remains well positioned within the ventricle from a RIGHT parietal approach. Extensive bifrontal and bitemporal encephalomalacia. Small fluid collection in LEFT frontal region, incidental hygroma, noncompressive, stable. 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 Vascular: Flow voids are maintained. Skull and upper cervical spine: No visible skull fracture or marrow abnormality. Artifact related to prior posterior cervical fusion at the C4 level. Sinuses/Orbits: Chronic BILATERAL paranasal sinus disease, with only slight layering fluid in the sphenoid and maxillary regions. Other:  None. IMPRESSION: 4 mm area of restricted diffusion RIGHT paramedian pons consistent with acute nonhemorrhagic infarction. This was not present on the previous study. Brain substance loss as described, representing both posttraumatic findings and chronic ischemic change. See discussion above. Electronically Signed   By: Elsie Stain M.D.   On: 03/18/2017 16:26   Dg Chest Port 1 View  Result Date: 03/19/2017 CLINICAL DATA:  Intubation. EXAM: PORTABLE CHEST 1  VIEW COMPARISON:  03/18/2017. FINDINGS: Endotracheal tube and NG tube in stable position. Heart size normal. Mild left base atelectasis/infiltrate, slightly progressed from prior exam. Small left pleural effusion. No pneumothorax . IMPRESSION: 1. Lines and tubes in stable position. 2. Mild left base atelectasis/ infiltrate, slightly progressed from prior exam. Small left pleural effusion unchanged. Electronically Signed   By: Maisie Fus  Register   On: 03/19/2017 06:51     STUDIES:  CT head/C-spine 8/20 > Unchanged size and configuration of the shunted ventricles and unchanged bifrontal encephalomalacia. No acute intracranial abnormality. No acute fracture or static subluxation of the cervical spine.  CULTURES: Blood 8/20 > Urine 8/20>  Tracheal asp 8/20 >  ANTIBIOTICS: Ceftriaxone 8/20>>>8/21 Merrem 8/21>>>8/22 Vancomycin 8/20>>>8/22  SIGNIFICANT EVENTS: 8/20 admit  LINES/TUBES: ETT 8/20>>> L IJ TLC 8/20>>>  DISCUSSION: 66 year old male with history of closed head injury with VP shunt in place and known seizure disorder was found down 8/20. Had not been taking seizure meds. Intubated in ED and treated with Keppra for presumed seizure. Admitted to ICU with neurology consultation.   ASSESSMENT / PLAN:  PULMONARY A: Inability  to protect airway secondary to AMS, presumed seizure  P:   Extubate today Titrate O2 for sat of 88-92% OOB to chair PT SLP IS  CARDIOVASCULAR A:  Hypertensive urgency (improved) PAF  P:  Tele monitoring PRN hydralazine  RENAL A:   AKI Hypokalemia, phos and mag  P:   KVO IVF Replace electrolytes as indicated BMET in AM  GASTROINTESTINAL A:   No acute issues  P:   SLP Protonix  HEMATOLOGIC A:   No acute issues  P:  Follow CBC SQ heparin  INFECTIOUS A:   Doubt meningitis  P:   Pan culture f/u D/C abx  ENDOCRINE A:   No acute issues   P:   Follow glucose on chemistry  NEUROLOGIC A:   Presumed seizure with history  of same H/o closed head injury with VP shunt in place H/o ETOH abuse, no use for past month per daughter  P:   RASS goal: -1 to -2 Keppra load in ED Neuro input appreciated, deferring to neurosurgery to interrogate shunt, will ask neurosurgery to evaluate MRI and EEG of the brain noted D/C all sedatives D/C fentanyl Thiamine Folate CIWA protocol with ativan ordered  FAMILY  - Updates: Family updated bedside  - Inter-disciplinary family meet or Palliative Care meeting due by:  8/20  The patient is critically ill with multiple organ systems failure and requires high complexity decision making for assessment and support, frequent evaluation and titration of therapies, application of advanced monitoring technologies and extensive interpretation of multiple databases.   Critical Care Time devoted to patient care services described in this note is  35  Minutes. This time reflects time of care of this signee Dr Koren Bound. This critical care time does not reflect procedure time, or teaching time or supervisory time of PA/NP/Med student/Med Resident etc but could involve care discussion time.  Alyson Reedy, M.D. [facility] Pulmonary/Critical Care Medicine. Pager: [phone]. After hours pager: [phone].  03/19/2017 12:31 PM

## 2017-03-19 NOTE — Progress Notes (Signed)
Pt. Extubated without incident.  Plt. Placed on 2lpm [place] at this time.

## 2017-03-20 ENCOUNTER — Inpatient Hospital Stay (HOSPITAL_COMMUNITY): Payer: Medicare Other

## 2017-03-20 DIAGNOSIS — R569 Unspecified convulsions: Secondary | ICD-10-CM

## 2017-03-20 DIAGNOSIS — E876 Hypokalemia: Secondary | ICD-10-CM

## 2017-03-20 LAB — URINE CULTURE
Culture: NO GROWTH
Special Requests: NORMAL

## 2017-03-20 LAB — GLUCOSE, CAPILLARY
GLUCOSE-CAPILLARY: 90 mg/dL (ref 65–99)
GLUCOSE-CAPILLARY: 131 mg/dL — AB (ref 65–99)
GLUCOSE-CAPILLARY: 122 mg/dL — AB (ref 65–99)
Glucose-Capillary: 82 mg/dL (ref 65–99)
Glucose-Capillary: 99 mg/dL (ref 65–99)

## 2017-03-20 LAB — BASIC METABOLIC PANEL
ANION GAP: 7 (ref 5–15)
BUN: 5 mg/dL — ABNORMAL LOW (ref 6–20)
CALCIUM: 8.6 mg/dL — AB (ref 8.9–10.3)
CO2: 23 mmol/L (ref 22–32)
CREATININE: 1 mg/dL (ref 0.61–1.24)
Chloride: 111 mmol/L (ref 101–111)
Glucose, Bld: 92 mg/dL (ref 65–99)
Potassium: 3.6 mmol/L (ref 3.5–5.1)
SODIUM: 141 mmol/L (ref 135–145)

## 2017-03-20 LAB — PHOSPHORUS: PHOSPHORUS: 2.1 mg/dL — AB (ref 2.5–4.6)

## 2017-03-20 LAB — CBC
HCT: 43.7 % (ref 39.0–52.0)
Hemoglobin: 14.3 g/dL (ref 13.0–17.0)
MCH: 29.7 pg (ref 26.0–34.0)
MCHC: 32.7 g/dL (ref 30.0–36.0)
MCV: 90.7 fL (ref 78.0–100.0)
PLATELETS: 175 10*3/uL (ref 150–400)
RBC: 4.82 MIL/uL (ref 4.22–5.81)
RDW: 15.4 % (ref 11.5–15.5)
WBC: 12.1 10*3/uL — AB (ref 4.0–10.5)

## 2017-03-20 LAB — MAGNESIUM: Magnesium: 1.9 mg/dL (ref 1.7–2.4)

## 2017-03-20 LAB — TRIGLYCERIDES: Triglycerides: 154 mg/dL — ABNORMAL HIGH (ref <150)

## 2017-03-20 MED ORDER — POTASSIUM PHOSPHATES 15 MMOLE/5ML IV SOLN
15.0000 mmol | Freq: Once | INTRAVENOUS | Status: AC
  Administered 2017-03-20: 15 mmol via INTRAVENOUS
  Filled 2017-03-20: qty 5

## 2017-03-20 MED ORDER — ACETAMINOPHEN 160 MG/5ML PO SOLN
650.0000 mg | Freq: Four times a day (QID) | ORAL | Status: DC | PRN
  Administered 2017-03-23: 650 mg via ORAL
  Filled 2017-03-20: qty 20.3

## 2017-03-20 MED ORDER — HYDRALAZINE HCL 25 MG PO TABS
25.0000 mg | ORAL_TABLET | Freq: Three times a day (TID) | ORAL | Status: DC
  Administered 2017-03-20 – 2017-03-24 (×13): 25 mg via ORAL
  Filled 2017-03-20 (×13): qty 1

## 2017-03-20 NOTE — Progress Notes (Signed)
Modified Barium Swallow Progress Note  Patient Details  Name: Miguel [name] MRN: [id number] Date of Birth: [birth year]  Today's Date: 03/20/2017  Modified Barium Swallow completed.  Full report located under Chart Review in the Imaging Section.  Brief recommendations include the following:  Clinical Impression  Pt has a mild oropharyngeal dysphagia with slow, weak posterior transit in his oral cavity and delayed swallow trigger. He does not have significant oral residuals although he could not move the pill beyond the anterior portion of his tongue, requiring SLP removal. He had deep penetration during the swallow with thin liquids that about touched the vocal folds, but was consistently ejected upon completion of the swallow. Recommend initiation of Dys 2 diet and thin liquids with meds crushed in puree. Will follow for tolerance and possible advancement.   Swallow Evaluation Recommendations       SLP Diet Recommendations: Dysphagia 2 (Fine chop) solids;Thin liquid   Liquid Administration via: Cup;Straw   Medication Administration: Crushed with puree   Supervision: Patient able to self feed;Full supervision/cueing for compensatory strategies   Compensations: Minimize environmental distractions;Slow rate;Small sips/bites   Postural Changes: Remain semi-upright after after feeds/meals (Comment);Seated upright at 90 degrees   Oral Care Recommendations: Oral care BID        Maxcine Ham 03/20/2017,12:23 PM   Maxcine Ham, M.A. CCC-SLP [phone]

## 2017-03-20 NOTE — NC FL2 (Signed)
[place] LEVEL OF CARE SCREENING TOOL     IDENTIFICATION  Patient Name: Miguel [name] Birthdate: [birth year] Sex: male Admission Date (Current Location): 03/17/2017  [facility] and [place] Number:  Herbalist and Address:  [facility] [place], [place] [address], [place], [place] [address]      Provider Number: [id number]  Attending Physician Name and Address:  Raylene Miyamoto, MD  Relative Name and Phone Number:       Current Level of Care: Hospital Recommended Level of Care: [place] Prior Approval Number:    Date Approved/Denied:   PASRR Number:   [id number]  Discharge Plan: SNF    Current Diagnoses: Patient Active Problem List   Diagnosis Date Noted  . Cellulitis 09/29/2015  . Lower extremity pain 09/29/2015  . Rhabdomyolysis 08/26/2015  . Seizure disorder ([place]) 10/18/2014  . Hypothermia 09/07/2014  . Acute renal failure (ARF) ([place]) 09/07/2014  . Metabolic acidosis 74/25/9563  . Acute encephalopathy 09/07/2014  . Homelessness 09/07/2014  . Dilantin level too low 09/07/2014  . Leukocytosis 09/07/2014  . Sepsis ([place]) 09/07/2014  . 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 Alcohol abuse 02/25/2013  . Dilantin toxicity 02/25/2013  . Tobacco abuse 02/25/2013  . Noncompliance 02/25/2013  . Seizures ([place])   . Hypertension     Orientation RESPIRATION BLADDER Height & Weight     Self, Time, Situation, Place  O2 (Nasal Cannula 2L.) Indwelling catheter Weight: 155 lb 13.8 oz (70.7 kg) Height:  '5\' 5"'  (165.1 cm)  BEHAVIORAL SYMPTOMS/MOOD NEUROLOGICAL BOWEL NUTRITION STATUS      Continent Diet (see discahrge summary )  AMBULATORY STATUS COMMUNICATION OF NEEDS Skin   Limited Assist Verbally Normal                       Personal Care Assistance Level of Assistance  Bathing, Feeding, Dressing Bathing Assistance: Limited assistance Feeding assistance: Limited assistance Dressing Assistance: Limited  assistance     Functional Limitations Info  Sight, Hearing, Speech Sight Info: Adequate Hearing Info: Adequate Speech Info: Adequate    SPECIAL CARE FACTORS FREQUENCY  PT (By licensed PT), OT (By licensed OT)     PT Frequency: 3 times a week  OT Frequency: 3 times a week             Contractures Contractures Info: Not present    Additional Factors Info  Code Status, Allergies Code Status Info: FULL  Allergies Info: NKA           Current Medications (03/20/2017):  This is the current hospital active medication list Current Facility-Administered Medications  Medication Dose Route Frequency Provider Last Rate Last Dose  . 0.9 %  sodium chloride infusion  250 mL Intravenous PRN Corey Harold, NP      . 0.9 %  sodium chloride infusion   Intravenous Continuous Rush Farmer, MD 50 mL/hr at 03/19/17 2308    . acetaminophen (TYLENOL) solution 650 mg  650 mg Per Tube Q6H PRN Anders Simmonds, MD   650 mg at 03/18/17 0449  . chlorhexidine gluconate (MEDLINE KIT) (PERIDEX) 0.12 % solution 15 mL  15 mL Mouth Rinse BID Raylene Miyamoto, MD   15 mL at 03/20/17 1017  . feeding supplement (VITAL HIGH PROTEIN) liquid 1,000 mL  1,000 mL Per Tube Continuous Rush Farmer, MD   Stopped at 03/19/17 1000  . folic acid (FOLVITE) tablet 1 mg  1 mg Oral Daily  Rush Farmer, MD   1 mg at 03/20/17 1013  . heparin injection 5,000 Units  5,000 Units Subcutaneous Q8H Corey Harold, NP   5,000 Units at 03/20/17 0549  . hydrALAZINE (APRESOLINE) injection 10 mg  10 mg Intravenous Q4H PRN Rush Farmer, MD   10 mg at 03/20/17 0434  . levETIRAcetam (KEPPRA) 500 mg in sodium chloride 0.9 % 100 mL IVPB  500 mg Intravenous Q12H Kerney Elbe, MD 420 mL/hr at 03/20/17 1014 500 mg at 03/20/17 1014  . LORazepam (ATIVAN) injection 0-4 mg  0-4 mg Intravenous Q12H Rush Farmer, MD      . LORazepam (ATIVAN) tablet 1 mg  1 mg Oral Q6H PRN Rush Farmer, MD       Or  . LORazepam (ATIVAN) injection  1 mg  1 mg Intravenous Q6H PRN Rush Farmer, MD      . MEDLINE mouth rinse  15 mL Mouth Rinse QID Raylene Miyamoto, MD   15 mL at 03/19/17 0400  . multivitamin with minerals tablet 1 tablet  1 tablet Oral Daily Rush Farmer, MD   1 tablet at 03/20/17 1013  . pantoprazole (PROTONIX) injection 40 mg  40 mg Intravenous QHS Corey Harold, NP   40 mg at 03/19/17 2127  . thiamine (VITAMIN B-1) tablet 100 mg  100 mg Oral Daily Rush Farmer, MD   100 mg at 03/20/17 1013   Or  . thiamine (B-1) injection 100 mg  100 mg Intravenous Daily Rush Farmer, MD   100 mg at 03/19/17 3267     Discharge Medications: Please see discharge summary for a list of discharge medications.  Relevant Imaging Results:  Relevant Lab Results:   Additional Information SSN- [id number]  Wetzel Bjornstad, LCSWA

## 2017-03-20 NOTE — Progress Notes (Signed)
At approximately 19:30 patient found pulling at foley catheter to the extent where bleeding was noted around the meatus and catheter tubing having a 6 inch clot within it; at that time 10cc saline removed from cathter bulb and foley taken the rest of the way out; patient continues to bleed from penis. Dr Sherene Sires called in E-link and notified of catheter removal, bleeding, and patient's stated urge to urinate; instructed to leave catheter out at this time and scan bladder later this evening if unable to urinate. Condom catheter placed on patient and mitts placed on hands. Will continue to monitor and bladder scan if needed.
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
## 2017-03-20 NOTE — Evaluation (Signed)
Physical Therapy Evaluation Patient Details Name: Miguel [name] MRN: [id number] DOB: [birth year] Today's Date: 03/20/2017   History of Present Illness  Pt adm 8/20 with presumed seizure. Intubated 8/20-8/22. 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 PMH - head injury, VP shunt, seizures, htn, etoh abuse.  Clinical Impression  Pt admitted with above diagnosis and presents to PT with functional limitations due to deficits listed below (See PT problem list). Pt needs skilled PT to maximize independence and safety to allow discharge to ST-SNF. Pt requiring assist with mobility and with impaired cognition. Feel he needs SNF for futher rehab.     Follow Up Recommendations SNF    Equipment Recommendations  Rolling walker with 5" wheels    Recommendations for Other Services OT consult     Precautions / Restrictions Precautions Precautions: Fall Restrictions Weight Bearing Restrictions: No      Mobility  Bed Mobility Overal bed mobility: Needs Assistance Bed Mobility: Supine to Sit     Supine to sit: Supervision;HOB elevated     General bed mobility comments: Pt with verbal cues to initiate and for technique. Pt with extended time required.  Transfers Overall transfer level: Needs assistance Equipment used: Rolling walker (2 wheeled) Transfers: Sit to/from Stand Sit to Stand: Min assist         General transfer comment: Assist to bring hips up and for balance. Extended time required.  Ambulation/Gait Ambulation/Gait assistance: Min assist Ambulation Distance (Feet): 8 Feet Assistive device: Rolling walker (2 wheeled) Gait Pattern/deviations: Step-through pattern;Decreased step length - right;Decreased step length - left;Trunk flexed Gait velocity: decr Gait velocity interpretation: Below normal speed for age/gender General Gait Details: Assist for balance and support. Verbal/tactile cues to continue taking steps. HR in 120's with activity. SpO2 >95% on RA.  Stairs             Wheelchair Mobility    Modified Rankin (Stroke Patients Only) Modified Rankin (Stroke Patients Only) Pre-Morbid Rankin Score: Slight disability Modified Rankin: Moderately severe disability     Balance Overall balance assessment: Needs assistance Sitting-balance support: No upper extremity supported;Feet supported Sitting balance-Leahy Scale: Fair     Standing balance support: Single extremity supported Standing balance-Leahy Scale: Poor Standing balance comment: walker and min assist for static balance                             Pertinent Vitals/Pain Pain Assessment: No/denies pain    Home Living Family/patient expects to be discharged to:: Other (Comment) (hotel) Living Arrangements: Children Available Help at Discharge: Other (Comment) (Unsure of availability) Type of Home: Other(Comment) Home Access: Level entry     Home Layout: One level Home Equipment: Cane - single point      Prior Function Level of Independence: Independent with assistive device(s)         Comments: Uses cane     Hand Dominance   Dominant Hand: Left    Extremity/Trunk Assessment   Upper Extremity Assessment Upper Extremity Assessment: Defer to OT evaluation    Lower Extremity Assessment Lower Extremity Assessment: Generalized weakness       Communication   Communication: No difficulties  Cognition Arousal/Alertness: Awake/alert Behavior During Therapy: Flat affect Overall Cognitive Status: Impaired/Different from baseline Area of Impairment: Orientation;Following commands;Safety/judgement;Problem solving                 Orientation Level: Disoriented to;Situation;Time     Following Commands: Follows one step commands with increased  time Safety/Judgement: Decreased awareness of deficits;Decreased awareness of safety   Problem Solving: Slow processing;Decreased initiation;Difficulty sequencing;Requires verbal cues;Requires tactile  cues General Comments: Extended time to complete all task.       General Comments      Exercises     Assessment/Plan    PT Assessment Patient needs continued PT services  PT Problem List Decreased strength;Decreased activity tolerance;Decreased balance;Decreased mobility;Decreased cognition       PT Treatment Interventions DME instruction;Gait training;Functional mobility training;Therapeutic activities;Therapeutic exercise;Balance training;Patient/family education;Cognitive remediation    PT Goals (Current goals can be found in the Care Plan section)  Acute Rehab PT Goals Patient Stated Goal: Not stated PT Goal Formulation: With patient Time For Goal Achievement: 04/03/17 Potential to Achieve Goals: Good    Frequency Min 3X/week   Barriers to discharge Other (comment) Pt has been living in hotel with step daughter due to they are homeless    Co-evaluation               AM-PAC PT "6 Clicks" Daily Activity  Outcome Measure Difficulty turning over in bed (including adjusting bedclothes, sheets and blankets)?: A Little Difficulty moving from lying on back to sitting on the side of the bed? : A Little Difficulty sitting down on and standing up from a chair with arms (e.g., wheelchair, bedside commode, etc,.)?: Unable Help needed moving to and from a bed to chair (including a wheelchair)?: A Little Help needed walking in hospital room?: A Little Help needed climbing 3-5 steps with a railing? : A Lot 6 Click Score: 15    End of Session Equipment Utilized During Treatment: Gait belt Activity Tolerance: Patient tolerated treatment well Patient left: in chair;with call bell/phone within reach;with chair alarm set Nurse Communication: Mobility status PT Visit Diagnosis: Unsteadiness on feet (R26.81);Other abnormalities of gait and mobility (R26.89);Muscle weakness (generalized) (M62.81)    Time: 0920-1000 PT Time Calculation (min) (ACUTE ONLY): 40 min   Charges:    PT Evaluation $PT Eval Moderate Complexity: 1 Mod PT Treatments $Therapeutic Activity: 23-37 mins   PT G Codes:        [facility] PT [phone]   Angelina Ok Kahi [place] 03/20/2017, 10:29 AM

## 2017-03-20 NOTE — Progress Notes (Signed)
PULMONARY / CRITICAL CARE MEDICINE   Name: Miguel [name] MRN: [id number] DOB: 08/30/1950    ADMISSION DATE:  03/17/2017 CONSULTATION DATE:  August 20  REFERRING MD:  Dr. 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 He also has a history of alcohol abuse and was recently homeless. Most recent seizure was about 6 months ago while living in [place]. The patient moved her with his daughter about 2 months ago and has not taken his medication since that time because he was not having seizures. He was in his usual state of health the morning of 8/20 his daughter left the hotel room where they were staying to run some errands. She came back an hour and a half later and found him unresponsive in the room. EMS was called and upon their arrival he was administered Narcan to which she had a very minimal response and did not respond to subsequent doses of Narcan. He was noted to have urinary incontinence.  Upon arrival to the emergency department he was intubated for airway protection. He was also profoundly hypertensive responding to 10 mg of hydralazine. CT scan of the head and cervical spine were unremarkable for acute change. He was loaded with IV Keppra for presumed seizure. PCCM asked to admit.  SUBJECTIVE:  No events overnight  VITAL SIGNS: BP (!) 193/95   Pulse 99   Temp 98.3 F (36.8 C) (Oral)   Resp 20   Ht 5\' 5"  (1.651 m)   Wt 70.7 kg (155 lb 13.8 oz)   SpO2 95%   BMI 25.94 kg/m   HEMODYNAMICS:    VENTILATOR SETTINGS: FiO2 (%):  [30 %] 30 %  INTAKE / OUTPUT: I/O last 3 completed shifts: In: 3356.1 [I.V.:1791.1; NG/GT:750; IV Piggyback:815] Out: 5075 [Urine:5075]  PHYSICAL EXAMINATION: General:  Thin elderly African-American male NAD sitting in chair  Neuro:  Awake and  following command, oriented x 2 HEENT:  Normocephalic atraumatic, PERRL, no appreciable JVD Cardiovascular:  RRR, no MRG Lungs:  resps even non labored on [place], Clear bilateral breath sounds Abdomen:  Soft, NT, Nd and +BS Musculoskeletal:  No acute deformity Skin:  Grossly intact  LABS:  BMET  Recent Labs Lab 03/18/17 0328 03/19/17 0250 03/20/17 0234  NA 144 141 141  K 3.8 4.1 3.6  CL 116* 113* 111  CO2 19* 21* 23  BUN 9 7 5*  CREATININE 1.22 1.05 1.00  GLUCOSE 95 114* 92    Electrolytes  Recent Labs Lab 03/18/17 0328 03/19/17 0250 03/20/17 0234  CALCIUM 8.0* 8.1* 8.6*  MG 1.9 2.3 1.9  PHOS 2.1* 3.3 2.1*    CBC  Recent Labs Lab 03/18/17 0328 03/19/17 0250 03/20/17 0234  WBC 11.6* 12.5* 12.1*  HGB 13.7 13.4 14.3  HCT 41.6 40.2 43.7  PLT 165 160 175    Coag's  Recent Labs Lab 03/17/17 1641  APTT 27  INR 0.99    Sepsis Markers  Recent Labs Lab 03/17/17 2009 03/17/17 2026 03/18/17 0619 03/18/17 0943  LATICACIDVEN  --  3.93* 1.5 1.5  PROCALCITON 2.67  --   --   --     ABG  Recent Labs Lab 03/17/17 2004 03/18/17 0505 03/19/17 0427  PHART 7.334* 7.389 7.445  PCO2ART 37.7 31.4* 31.5*  PO2ART 165.0* 83.6 91.4    Liver Enzymes  Recent Labs Lab 03/17/17 1641  AST 31  ALT 16*  ALKPHOS 71  BILITOT 0.6  ALBUMIN 3.8    Cardiac Enzymes No results for input(s): TROPONINI, PROBNP in the last 168 hours.  Glucose  Recent Labs Lab 03/19/17 1615 03/19/17 2021 03/19/17 2339 03/20/17 0339 03/20/17 0827 03/20/17 1124  GLUCAP 103* 96 99 90 82 99    Imaging No results found.   STUDIES:  CT head/C-spine 8/20 > Unchanged size and configuration of the shunted ventricles and unchanged bifrontal encephalomalacia. No acute intracranial abnormality. No acute fracture or static subluxation of the cervical spine.  CULTURES: Blood 8/20 > Urine 8/20>  Tracheal asp 8/20 >  ANTIBIOTICS: Ceftriaxone 8/20>>>8/21 Merrem  8/21>>>8/22 Vancomycin 8/20>>>8/22  SIGNIFICANT EVENTS: 8/20 admit  LINES/TUBES: ETT 8/20>>>8/22 L IJ TLC 8/20>>>  DISCUSSION: 66 year old male with history of closed head injury with VP shunt in place and known seizure disorder was found down 8/20. Had not been taking seizure meds. Intubated in ED and treated with Keppra for presumed seizure. Admitted to ICU with neurology consultation.   ASSESSMENT / PLAN:  PULMONARY A: Inability to protect airway secondary to AMS, presumed seizure  P:   Mobilize  Pulmonary hygiene  Speech following    CARDIOVASCULAR A:  Hypertensive urgency (resolved) PAF  P:  Tele monitoring PRN hydralazine  RENAL A:   AKI Hypokalemia, phos and mag  P:   KVO IVF Replace electrolytes as indicated BMET in AM  GASTROINTESTINAL A:   No acute issues  P:   SLP - MBS today  Protonix  HEMATOLOGIC A:   No acute issues  P:  Follow CBC SQ heparin  INFECTIOUS A:   Doubt meningitis  P:   Pan culture f/u D/C abx  ENDOCRINE A:   No acute issues   P:   Follow glucose on chemistry  NEUROLOGIC A:   Presumed seizure with history of same H/o closed head injury with VP shunt in place H/o ETOH abuse, no use for past month per daughter  P:   EEG neg  Continue keppra  Neuro input appreciated, deferring to neurosurgery to interrogate shunt if needed  Thiamine Folate CIWA protocol with ativan ordered  FAMILY  - Updates: no family available 8/23  - Inter-disciplinary family meet or Palliative Care meeting due by:  8/20  Will tx to SDU, ask [facility] to assume care 8/24  Dirk Dress, NP 03/20/2017  11:31 AM Pager: [phone] or [phone]  Attending Note:  66 year old male with history of TBI s/p VP shunt who had a seizure due to noncompliance with anti-epileptic medications who presents to PCCM in respiratory failure and intubated.  Patient was worked up by neurology and felt that this was due to lack of  medications as above and subsequently extubated on 8/22.  Patient is doing well on exam this AM.  I reviewed CXR myself, no acute disease noted.  Discussed with PCCM-NP and TRH-MD.  Acute respiratory failure due to failure to protect airway post ictal.  - Monitor for airway protection  - Diet  Seizure:  - Keppra  - Neurology following  Hypoxemia:  - Titrate O2 for sat of 88-92%  HypoK  - Replace and recheck  HypoMag  - Replace and recheck  HypoPhos  - Replace and recheck  Transfer to SDU and to [facility] service with PCCM off 8/24.  Patient seen and examined, agree with above note.  I dictated the care and orders written for this patient  under my direction.  Rush Farmer, [place]

## 2017-03-21 DIAGNOSIS — N179 Acute kidney failure, unspecified: Secondary | ICD-10-CM

## 2017-03-21 DIAGNOSIS — R400 Somnolence: Secondary | ICD-10-CM

## 2017-03-21 DIAGNOSIS — I160 Hypertensive urgency: Secondary | ICD-10-CM

## 2017-03-21 DIAGNOSIS — I480 Paroxysmal atrial fibrillation: Secondary | ICD-10-CM

## 2017-03-21 DIAGNOSIS — F10129 Alcohol abuse with intoxication, unspecified: Secondary | ICD-10-CM

## 2017-03-21 LAB — GLUCOSE, CAPILLARY
GLUCOSE-CAPILLARY: 110 mg/dL — AB (ref 65–99)
GLUCOSE-CAPILLARY: 110 mg/dL — AB (ref 65–99)
GLUCOSE-CAPILLARY: 123 mg/dL — AB (ref 65–99)
GLUCOSE-CAPILLARY: 129 mg/dL — AB (ref 65–99)

## 2017-03-21 LAB — BASIC METABOLIC PANEL
Anion gap: 10 (ref 5–15)
BUN: 10 mg/dL (ref 6–20)
CO2: 20 mmol/L — ABNORMAL LOW (ref 22–32)
CREATININE: 0.87 mg/dL (ref 0.61–1.24)
Calcium: 8.8 mg/dL — ABNORMAL LOW (ref 8.9–10.3)
Chloride: 110 mmol/L (ref 101–111)
Glucose, Bld: 113 mg/dL — ABNORMAL HIGH (ref 65–99)
POTASSIUM: 3.7 mmol/L (ref 3.5–5.1)
SODIUM: 140 mmol/L (ref 135–145)

## 2017-03-21 LAB — CBC
HCT: 41.3 % (ref 39.0–52.0)
Hemoglobin: 13.6 g/dL (ref 13.0–17.0)
MCH: 29.8 pg (ref 26.0–34.0)
MCHC: 32.9 g/dL (ref 30.0–36.0)
MCV: 90.4 fL (ref 78.0–100.0)
PLATELETS: 192 10*3/uL (ref 150–400)
RBC: 4.57 MIL/uL (ref 4.22–5.81)
RDW: 15.3 % (ref 11.5–15.5)
WBC: 9.1 10*3/uL (ref 4.0–10.5)

## 2017-03-21 LAB — MAGNESIUM: MAGNESIUM: 1.7 mg/dL (ref 1.7–2.4)

## 2017-03-21 MED ORDER — HEPARIN (PORCINE) IN NACL 100-0.45 UNIT/ML-% IJ SOLN
1000.0000 [IU]/h | INTRAMUSCULAR | Status: DC
  Administered 2017-03-21: 1000 [IU]/h via INTRAVENOUS
  Filled 2017-03-21 (×3): qty 250

## 2017-03-21 MED ORDER — ENSURE ENLIVE PO LIQD
237.0000 mL | Freq: Two times a day (BID) | ORAL | Status: DC
  Administered 2017-03-21 – 2017-03-24 (×6): 237 mL via ORAL

## 2017-03-21 MED ORDER — HEPARIN BOLUS VIA INFUSION
3000.0000 [IU] | Freq: Once | INTRAVENOUS | Status: AC
  Administered 2017-03-21: 3000 [IU] via INTRAVENOUS
  Filled 2017-03-21: qty 3000

## 2017-03-21 NOTE — Care Management Note (Signed)
Case Management Note  Patient Details  Name: YANICK SYLTE MRN: [id number] Date of Birth: 04-25-1951  Subjective/Objective:      Pt admitted with AMS              Action/Plan:   PTA from hotel with daughter.  SNF recommended - CSW actively following.  CM will continue to follow for discharge needs   Expected Discharge Date:                  Expected Discharge Plan:  Skilled Nursing Facility  In-House Referral:  Clinical Social Work  Discharge planning Services  CM Consult  Post Acute Care Choice:    Choice offered to:     DME Arranged:    DME Agency:     HH Arranged:    HH Agency:     Status of Service:     If discussed at [employer], dates discussed:    Additional Comments:  Cherylann Parr, RN 03/21/2017, 10:52 AM

## 2017-03-21 NOTE — Progress Notes (Signed)
  Speech Language Pathology Treatment: Dysphagia  Patient Details Name: Miguel [name] MRN: [id number] DOB: 04/04/1951 Today's Date: 03/21/2017 Time: 8875-7972 SLP Time Calculation (min) (ACUTE ONLY): 8 min  Assessment / Plan / Recommendation Clinical Impression  Pt with flat affect; reviewed result of yesterday's MBS. Not certain pt comprehended. Moderate verbal cues needed for smaller sips with clinical explanation. He appears to prolong his laryngeal elevation perhaps as subconscious strategy to protect larynx. Discussed possibility of upgrading diet but with affect/cognition?, recommend continue Dys 2 for now and thin liquids. Will continue to follow.    HPI HPI: Pt was admitted 8/20 with presumed seizure. Intubated 8/20-8/22. 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 PMH - head injury, VP shunt, seizures, htn, etoh abuse. He had a prior swallow eval 09/08/14 with normal appearing swallow, recommending regular diet, thin liquids, no f/u.      SLP Plan  Continue with current plan of care       Recommendations  Diet recommendations: Dysphagia 2 (fine chop);Thin liquid Liquids provided via: Cup;Straw Medication Administration: Crushed with puree Supervision: Patient able to self feed;Full supervision/cueing for compensatory strategies Compensations: Minimize environmental distractions;Slow rate;Small sips/bites Postural Changes and/or Swallow Maneuvers: Seated upright 90 degrees                Oral Care Recommendations: Oral care QID Follow up Recommendations: Skilled Nursing facility SLP Visit Diagnosis: Dysphagia, oropharyngeal phase (R13.12) Plan: Continue with current plan of care       GO                Royce Macadamia 03/21/2017, 2:01 PM  Breck Coons Lonell Face.Ed [employer] [phone]

## 2017-03-21 NOTE — Progress Notes (Signed)
Physical Therapy Treatment Patient Details Name: Miguel [name] MRN: [id number] DOB: [birth year] Today's Date: 03/21/2017    History of Present Illness Pt adm 8/20 with presumed seizure. Intubated 8/20-8/22. 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 PMH - head injury, VP shunt, seizures, htn, etoh abuse.    PT Comments    Patient is making progress toward mobility goals. Pt does demonstrate cognitive and balance deficits increasing risk for falls. Bring cane next session. Current plan remains appropriate.    Follow Up Recommendations  SNF     Equipment Recommendations  Other (comment) (TBD next venue)    Recommendations for Other Services OT consult     Precautions / Restrictions Precautions Precautions: Fall Restrictions Weight Bearing Restrictions: No    Mobility  Bed Mobility Overal bed mobility: Needs Assistance Bed Mobility: Supine to Sit;Sit to Supine     Supine to sit: HOB elevated;Modified independent (Device/Increase time) Sit to supine: Modified independent (Device/Increase time)   General bed mobility comments: cues to initiate and follow through with task; increased time and effort  Transfers Overall transfer level: Needs assistance Equipment used: None Transfers: Sit to/from Stand Sit to Stand: Min assist         General transfer comment: assist to steady upon standing; pt able to power up without assist  Ambulation/Gait Ambulation/Gait assistance: Min assist Ambulation Distance (Feet): 120 Feet Assistive device: None Gait Pattern/deviations: Step-through pattern;Decreased step length - right;Decreased step length - left;Trunk flexed;Narrow base of support Gait velocity: decr   General Gait Details: assist for balance and cues for increased bilat step lengths; increased assistance required for turning/directional changes; HR into upper 120s with mobility   Stairs            Wheelchair Mobility    Modified Rankin (Stroke Patients  Only) Modified Rankin (Stroke Patients Only) Pre-Morbid Rankin Score: Slight disability Modified Rankin: Moderately severe disability     Balance Overall balance assessment: Needs assistance Sitting-balance support: No upper extremity supported;Feet supported Sitting balance-Leahy Scale: Good     Standing balance support: No upper extremity supported Standing balance-Leahy Scale: Poor Standing balance comment: requires assistance for balance with dynamic activity                            Cognition Arousal/Alertness: Awake/alert Behavior During Therapy: Flat affect Overall Cognitive Status: Impaired/Different from baseline Area of Impairment: Orientation;Following commands;Safety/judgement;Problem solving                 Orientation Level: Disoriented to;Situation;Time     Following Commands: Follows one step commands with increased time Safety/Judgement: Decreased awareness of deficits;Decreased awareness of safety   Problem Solving: Slow processing;Decreased initiation;Difficulty sequencing;Requires verbal cues;Requires tactile cues General Comments: Extended time to complete all task.       Exercises      General Comments        Pertinent Vitals/Pain Pain Assessment: No/denies pain    Home Living                      Prior Function            PT Goals (current goals can now be found in the care plan section) Acute Rehab PT Goals Patient Stated Goal: Not stated PT Goal Formulation: With patient Time For Goal Achievement: 04/03/17 Potential to Achieve Goals: Good Progress towards PT goals: Progressing toward goals    Frequency    Min 3X/week  PT Plan Current plan remains appropriate    Co-evaluation              AM-PAC PT "6 Clicks" Daily Activity  Outcome Measure  Difficulty turning over in bed (including adjusting bedclothes, sheets and blankets)?: A Little Difficulty moving from lying on back to sitting  on the side of the bed? : A Little Difficulty sitting down on and standing up from a chair with arms (e.g., wheelchair, bedside commode, etc,.)?: Unable Help needed moving to and from a bed to chair (including a wheelchair)?: A Little Help needed walking in hospital room?: A Little Help needed climbing 3-5 steps with a railing? : A Lot 6 Click Score: 15    End of Session Equipment Utilized During Treatment: Gait belt Activity Tolerance: Patient tolerated treatment well Patient left: with call bell/phone within reach;in bed;with bed alarm set;with SCD's reapplied;Other (comment) (bilat mittens on) Nurse Communication: Mobility status PT Visit Diagnosis: Unsteadiness on feet (R26.81);Other abnormalities of gait and mobility (R26.89);Muscle weakness (generalized) (M62.81)     Time: 6568-1275 PT Time Calculation (min) (ACUTE ONLY): 26 min  Charges:  $Gait Training: 8-22 mins $Therapeutic Activity: 8-22 mins                    G Codes:       Erline Levine, PTA Pager: [phone]     Carolynne Edouard 03/21/2017, 11:31 AM

## 2017-03-21 NOTE — Progress Notes (Signed)
Nutrition Follow-up  DOCUMENTATION CODES:   Not applicable  INTERVENTION:   -Ensure Enlive po BID, each supplement provides 350 kcal and 20 grams of protein -Continue MVI daily  NUTRITION DIAGNOSIS:   Inadequate oral intake related to  (decreased appetite) as evidenced by  (variable PO intake- 0-95% meal completion).  Progressing  GOAL:   Patient will meet greater than or equal to 90% of their needs  Progressing  MONITOR:   PO intake, Supplement acceptance, Diet advancement, Labs, Weight trends, Skin, I & O's  REASON FOR ASSESSMENT:   Consult Enteral/tube feeding initiation and management  ASSESSMENT:   66 y.o. Male with PMH of seizures and closed head injury with VP shunt; admitted with sepsis and possible pneumonia with concern for meningis with VP shunt.  8/21- MRI revealed brain stem infarct 8/22- extubated 8/23- s/p BSE- recommenced NPO status, s/p MBSS- advance to dysphagia 2 diet with thin liquids  Spoke with pt, who reports his appetite is currently fair, but improving. Observe breakfast tray at bedside- pt consumed 100% of fruit, juice, and applesauce. Pancakes were untouched. He reports tolerating diet texture well and denies any issues with swallowing.  Meal completion variable; noted 0-95% meal intake.  Medications reviewed and include folvite, MVI, and thiamine.  Labs reviewed: CBGS: 110-129.   Diet Order:  DIET DYS 2 Room service appropriate? Yes; Fluid consistency: Thin  Skin:  Reviewed, no issues  Last BM:  03/20/17  Height:   Ht Readings from Last 1 Encounters:  03/17/17 5\' 5"  (1.651 m)    Weight:   Wt Readings from Last 1 Encounters:  03/21/17 153 lb 14.1 oz (69.8 kg)    Ideal Body Weight:  56.8 kg  BMI:  Body mass index is 25.61 kg/m.  Estimated Nutritional Needs:   Kcal:  1800-2000  Protein:  90-105 grams  Fluid:  1.8-2.0 L  EDUCATION NEEDS:   Education needs addressed  Demondre Aguas A. Mayford Knife, RD, LDN, CDE Pager:  [phone] After hours Pager: [phone]

## 2017-03-21 NOTE — Progress Notes (Signed)
ANTICOAGULATION CONSULT NOTE - Initial Consult  Pharmacy Consult for heparin Indication: atrial fibrillation   No Known Allergies  Patient Measurements: Height: 5\' 5"  (165.1 cm) Weight: 153 lb 14.1 oz (69.8 kg) IBW/kg (Calculated) : 61.5 Heparin Dosing Weight: 69.8 kg  Vital Signs: Temp: 97.5 F (36.4 C) (08/24 2022) Temp Source: Oral (08/24 2022) BP: 136/89 (08/24 2022) Pulse Rate: 92 (08/24 2022)  Labs:  Recent Labs  03/19/17 0250 03/20/17 0234 03/21/17 0951  HGB 13.4 14.3 13.6  HCT 40.2 43.7 41.3  PLT 160 175 192  CREATININE 1.05 1.00 0.87    Estimated Creatinine Clearance: 73.6 mL/min (by C-G formula based on SCr of 0.87 mg/dL).   Medical History: Past Medical History:  Diagnosis Date  . Alcohol abuse   . Atrial fibrillation (HCC)   . Closed head injury 05/2006   hx/notes 11/01/2009  . DVT (deep venous thrombosis) (HCC) 06/2006   [name] 10/19/2009  . Heart murmur   . Hypertension   . 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 Seizures (HCC)    [name] 10/19/2009  . Stroke (HCC) 09/2009   w/right sided weakness/notes 10/31/2009    Assessment: 65 YOM admitted on 8/20 with AMS, hx of closed head injury 7 years ago, head CT no acute changes. Now to start IV heparin for afib. CBC wnl. He was on sq heparin for VTE px, now has been d/c'd. Not on chronic anticoagulation. baseline APTT, INR wnl.  Goal of Therapy:  Heparin level 0.3-0.7 units/ml Monitor platelets by anticoagulation protocol: Yes   Plan:  Heparin bolus 3000 units x 1 Heparin infusion 1000 units/hr Daily heparin level and CBC  Bayard Hugger, PharmD, BCPS  Clinical Pharmacist  Pager: [phone]   03/21/2017,9:43 PM
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
## 2017-03-22 DIAGNOSIS — I639 Cerebral infarction, unspecified: Secondary | ICD-10-CM

## 2017-03-22 LAB — CBC
HCT: 40.2 % (ref 39.0–52.0)
Hemoglobin: 13.5 g/dL (ref 13.0–17.0)
MCH: 30 pg (ref 26.0–34.0)
MCHC: 33.6 g/dL (ref 30.0–36.0)
MCV: 89.3 fL (ref 78.0–100.0)
PLATELETS: 197 10*3/uL (ref 150–400)
RBC: 4.5 MIL/uL (ref 4.22–5.81)
RDW: 14.9 % (ref 11.5–15.5)
WBC: 10.2 10*3/uL (ref 4.0–10.5)

## 2017-03-22 LAB — CULTURE, BLOOD (ROUTINE X 2)
CULTURE: NO GROWTH
CULTURE: NO GROWTH
Special Requests: ADEQUATE
Special Requests: ADEQUATE

## 2017-03-22 LAB — HEPARIN LEVEL (UNFRACTIONATED): Heparin Unfractionated: 0.88 IU/mL — ABNORMAL HIGH (ref 0.30–0.70)

## 2017-03-22 NOTE — Progress Notes (Signed)
ANTICOAGULATION CONSULT NOTE - Initial Consult  Pharmacy Consult for heparin Indication: atrial fibrillation   No Known Allergies  Patient Measurements: Height: 5\' 5"  (165.1 cm) Weight: 151 lb 12.8 oz (68.9 kg) IBW/kg (Calculated) : 61.5 Heparin Dosing Weight: 69.8 kg  Vital Signs: Temp: 99.2 F (37.3 C) (08/25 0545) Temp Source: Oral (08/25 0545) BP: 141/66 (08/25 0545) Pulse Rate: 72 (08/25 0545)  Labs:  Recent Labs  03/20/17 0234 03/21/17 0951 03/22/17 0653  HGB 14.3 13.6 13.5  HCT 43.7 41.3 40.2  PLT 175 192 197  HEPARINUNFRC  --   --  0.88*  CREATININE 1.00 0.87  --     Estimated Creatinine Clearance: 73.6 mL/min (by C-G formula based on SCr of 0.87 mg/dL).   Medical History: Past Medical History:  Diagnosis Date  . Alcohol abuse   . Atrial fibrillation (HCC)   . Closed head injury 05/2006   hx/notes 11/01/2009  . DVT (deep venous thrombosis) (HCC) 06/2006   [name] 10/19/2009  . Heart murmur   . Hypertension   . 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 Seizures (HCC)    [name] 10/19/2009  . Stroke (HCC) 09/2009   w/right sided weakness/notes 10/31/2009    Assessment: 65 YOM admitted on 8/20 with AMS, hx of closed head injury 7 years ago, head CT no acute changes. Now to start IV heparin for afib. CBC wnl. He was on sq heparin for VTE px, now has been d/c'd. Not on chronic anticoagulation. baseline APTT, INR wnl.  8/25 Heparin level is supratherapeutic at 0.88 on 1000 units/hr.  Goal of Therapy:  Heparin level 0.3-0.7 units/ml Monitor platelets by anticoagulation protocol: Yes   Plan:  Decrease Heparin infusion to 950 units/hr Daily heparin level and CBC  Ladell Pier, PharmD Pharmacy Resident Pager: [phone] 03/22/2017 8:30 AM

## 2017-03-22 NOTE — Progress Notes (Signed)
Miguel [name] is a 66 y.o. male patient who transferred  from 2C awake, alert  & oriented to name, place, and reason for admission, but did not know the month, Full Code, VSS - Blood pressure (!) 191/92, pulse 76, temperature 98.1 F (36.7 C), temperature source Oral, resp. rate 18, height 5\' 5"  (1.651 m), weight 69.8 kg (153 lb 14.1 oz), SpO2 95 %.RA, no c/o shortness of breath, no c/o chest pain, no distress noted. Tele # 24 placed.  IV site WDL:  with a transparent dsg that's clean dry and intact.  Allergies:  No Known Allergies   Past Medical History:  Diagnosis Date  . Alcohol abuse   . Atrial fibrillation (HCC)   . Closed head injury 05/2006   hx/notes 11/01/2009  . DVT (deep venous thrombosis) (HCC) 06/2006   [name] 10/19/2009  . Heart murmur   . Hypertension   . 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 Seizures (HCC)    [name] 10/19/2009  . Stroke (HCC) 09/2009   w/right sided weakness/notes 10/31/2009    Pt orientation to unit, room and routine. SR up x 2, fall risk assessment complete with Patient verbalizing understanding of risks associated with falls. Pt verbalizes an understanding of how to use the call bell and to call for help before getting out of bed.  Skin, clean-dry- abrasion on patient's face just below right eye lateral to noses; 2 abrasions cleansed and OTA on groin.   Will cont to monitor and assist as needed.  Elisha Ponder, RN 03/22/2017 1:41 AM
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
## 2017-03-23 ENCOUNTER — Other Ambulatory Visit (HOSPITAL_COMMUNITY): Payer: Medicare Other

## 2017-03-23 DIAGNOSIS — F1099 Alcohol use, unspecified with unspecified alcohol-induced disorder: Secondary | ICD-10-CM

## 2017-03-23 DIAGNOSIS — I482 Chronic atrial fibrillation: Secondary | ICD-10-CM

## 2017-03-23 DIAGNOSIS — G40909 Epilepsy, unspecified, not intractable, without status epilepticus: Secondary | ICD-10-CM

## 2017-03-23 LAB — LIPID PANEL
Cholesterol: 149 mg/dL (ref 0–200)
HDL: 38 mg/dL — AB (ref >40)
LDL CALC: 88 mg/dL (ref 0–99)
Total CHOL/HDL Ratio: 3.9 RATIO
Triglycerides: 113 mg/dL (ref <150)
VLDL: 23 mg/dL (ref 0–40)

## 2017-03-23 LAB — HEMOGLOBIN A1C
HEMOGLOBIN A1C: 5.8 % — AB (ref 4.8–5.6)
MEAN PLASMA GLUCOSE: 119.76 mg/dL

## 2017-03-23 LAB — BASIC METABOLIC PANEL
Anion gap: 9 (ref 5–15)
BUN: 9 mg/dL (ref 6–20)
CALCIUM: 8.8 mg/dL — AB (ref 8.9–10.3)
CO2: 24 mmol/L (ref 22–32)
Chloride: 107 mmol/L (ref 101–111)
Creatinine, Ser: 0.9 mg/dL (ref 0.61–1.24)
GFR calc Af Amer: >60 mL/min (ref >60)
GLUCOSE: 91 mg/dL (ref 65–99)
POTASSIUM: 3.8 mmol/L (ref 3.5–5.1)
SODIUM: 140 mmol/L (ref 135–145)

## 2017-03-23 LAB — CBC
HCT: 38.5 % — ABNORMAL LOW (ref 39.0–52.0)
Hemoglobin: 12.5 g/dL — ABNORMAL LOW (ref 13.0–17.0)
MCH: 29.7 pg (ref 26.0–34.0)
MCHC: 32.5 g/dL (ref 30.0–36.0)
MCV: 91.4 fL (ref 78.0–100.0)
PLATELETS: 210 10*3/uL (ref 150–400)
RBC: 4.21 MIL/uL — AB (ref 4.22–5.81)
RDW: 15.1 % (ref 11.5–15.5)
WBC: 10.9 10*3/uL — AB (ref 4.0–10.5)

## 2017-03-23 LAB — HEPARIN LEVEL (UNFRACTIONATED)
Heparin Unfractionated: <0.1 IU/mL — ABNORMAL LOW (ref 0.30–0.70)
Heparin Unfractionated: >2.2 IU/mL — ABNORMAL HIGH (ref 0.30–0.70)

## 2017-03-23 LAB — TRIGLYCERIDES: TRIGLYCERIDES: 161 mg/dL — AB (ref <150)

## 2017-03-23 MED ORDER — RIVAROXABAN 20 MG PO TABS
20.0000 mg | ORAL_TABLET | Freq: Every day | ORAL | Status: DC
  Administered 2017-03-23: 20 mg via ORAL
  Filled 2017-03-23: qty 1

## 2017-03-23 MED ORDER — ATORVASTATIN CALCIUM 20 MG PO TABS
20.0000 mg | ORAL_TABLET | Freq: Every day | ORAL | Status: DC
  Administered 2017-03-23: 20 mg via ORAL
  Filled 2017-03-23: qty 1

## 2017-03-23 MED ORDER — HEPARIN BOLUS VIA INFUSION
2000.0000 [IU] | Freq: Once | INTRAVENOUS | Status: AC
  Administered 2017-03-23: 2000 [IU] via INTRAVENOUS
  Filled 2017-03-23: qty 2000

## 2017-03-23 NOTE — Progress Notes (Signed)
Heparin level >2.2 due to drug/lab interaction with Xarelto. Heparin d/c'd earlier today.  Sanjana Folz S. Merilynn Finland, PharmD, BCPS Clinical Staff Pharmacist Pager [phone]
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
## 2017-03-23 NOTE — Progress Notes (Signed)
ANTICOAGULATION CONSULT NOTE - Follow-Up Consult  Pharmacy Consult for heparin Indication: atrial fibrillation   No Known Allergies  Patient Measurements: Height: 5\' 5"  (165.1 cm) Weight: 151 lb 12.8 oz (68.9 kg) IBW/kg (Calculated) : 61.5 Heparin Dosing Weight: 69.8 kg  Vital Signs: Temp: 98.5 F (36.9 C) (08/26 0604) Temp Source: Oral (08/26 0604) BP: 136/73 (08/26 0604) Pulse Rate: 64 (08/26 0604)  Labs:  Recent Labs  03/21/17 0951 03/22/17 0653 03/23/17 0613  HGB 13.6 13.5 12.5*  HCT 41.3 40.2 38.5*  PLT 192 197 210  HEPARINUNFRC  --  0.88* <0.10*  CREATININE 0.87  --  0.90    Estimated Creatinine Clearance: 71.2 mL/min (by C-G formula based on SCr of 0.9 mg/dL).   Medical History: Past Medical History:  Diagnosis Date  . Alcohol abuse   . Atrial fibrillation (HCC)   . Closed head injury 05/2006   hx/notes 11/01/2009  . DVT (deep venous thrombosis) (HCC) 06/2006   [name] 10/19/2009  . Heart murmur   . Hypertension   . 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 Seizures (HCC)    [name] 10/19/2009  . Stroke (HCC) 09/2009   w/right sided weakness/notes 10/31/2009    Assessment: 65 YOM admitted on 8/20 with AMS, hx of closed head injury 7 years ago, head CT no acute changes. Now to start IV heparin for afib. CBC wnl. He was on sq heparin for VTE px, now has been d/c'd. Not on chronic anticoagulation. baseline APTT, INR wnl.  8/25 Heparin level is supratherapeutic at 0.88 after 3000 unit bolus and 1000 units/hr. 8/26 Heparin level is subtherapeutic at <0.10 after dose change to 950 units/hr.  Goal of Therapy:  Heparin level 0.3-0.7 units/ml Monitor platelets by anticoagulation protocol: Yes   Plan:  Bolus at 2000 units/hr and start infusion at 1000 units/hr Recheck HL in 8hrs Daily heparin level and CBC  Ladell Pier, PharmD Pharmacy Resident Pager: [phone] 03/23/2017 9:04 AM

## 2017-03-23 NOTE — Evaluation (Signed)
OT Cancellation Note  Patient Details Name: Miguel [name] MRN: [id number] DOB: [birth year]   Cancelled Treatment:    Reason Eval/Treat Not Completed: Other (comment); Pt just had condom catheter place, reporting increased pain/discomfort. Declining OOB/EOB mobility at this time, will check back as schedule permits.  Marcy Siren, OT Pager [phone] 03/23/2017   Orlando Penner 03/23/2017, 1:00 PM

## 2017-03-23 NOTE — Evaluation (Signed)
Occupational Therapy Evaluation Patient Details Name: Miguel [name] MRN: [id number] DOB: Dec 31, 1950 Today's Date: 03/23/2017    History of Present Illness Pt adm 8/20 with presumed seizure. Intubated 8/20-8/22. 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 PMH - head injury, VP shunt, seizures, htn, etoh abuse.   Clinical Impression   This 66 y/o M presents with the above. At baseline Pt reports he was independent with ADLs and mod independent with functional mobility using SPC. Pt currently requires minA with functional mobility, ModA for LB ADLs. Pt requires increased time and cues to complete tasks during session. Feel Pt will benefit from additional acute OT services and recommend ST SNF OT services prior to return home to maximize Pt's safety and independence with ADLs and functional mobility.     Follow Up Recommendations  SNF;Supervision/Assistance - 24 hour    Equipment Recommendations  Other (comment) (TBD in next venue )           Precautions / Restrictions Precautions Precautions: Fall Restrictions Weight Bearing Restrictions: No      Mobility Bed Mobility Overal bed mobility: Needs Assistance Bed Mobility: Supine to Sit;Sit to Supine     Supine to sit: HOB elevated;Modified independent (Device/Increase time) Sit to supine: Modified independent (Device/Increase time)   General bed mobility comments: cues to initiate task; increased time to complete   Transfers Overall transfer level: Needs assistance Equipment used: None Transfers: Sit to/from Stand Sit to Stand: Min assist              Balance Overall balance assessment: Needs assistance Sitting-balance support: No upper extremity supported;Feet supported Sitting balance-Leahy Scale: Good Sitting balance - Comments: sitting EOB to adjust socks    Standing balance support: No upper extremity supported Standing balance-Leahy Scale: Poor Standing balance comment: requires assistance for balance with  dynamic activity, reaching out to steady self on furniture during room level mobility                            ADL either performed or assessed with clinical judgement   ADL Overall ADL's : Needs assistance/impaired Eating/Feeding: Set up;Sitting   Grooming: Min guard;Standing   Upper Body Bathing: Min guard;Sitting   Lower Body Bathing: Minimal assistance;Sit to/from stand   Upper Body Dressing : Min guard;Sitting   Lower Body Dressing: Sit to/from stand;Moderate assistance Lower Body Dressing Details (indicate cue type and reason): Pt able to adjust socks sitting EOB (bending towards feet)  Toilet Transfer: Minimal assistance;Ambulation;Regular Toilet;Grab bars   Toileting- Clothing Manipulation and Hygiene: Minimal assistance;Sit to/from stand       Functional mobility during ADLs: Minimal assistance                           Pertinent Vitals/Pain Pain Assessment: Faces Faces Pain Scale: No hurt     Hand Dominance Left   Extremity/Trunk Assessment Upper Extremity Assessment Upper Extremity Assessment: Generalized weakness   Lower Extremity Assessment Lower Extremity Assessment: Defer to PT evaluation       Communication Communication Communication: No difficulties   Cognition Arousal/Alertness: Awake/alert Behavior During Therapy: Flat affect Overall Cognitive Status: Impaired/Different from baseline Area of Impairment: Following commands;Safety/judgement;Problem solving                       Following Commands: Follows one step commands with increased time Safety/Judgement: Decreased awareness of deficits;Decreased awareness of safety  Problem Solving: Slow processing;Decreased initiation;Difficulty sequencing;Requires verbal cues;Requires tactile cues General Comments: requires increased time to complete tasks and repetition/multimodal cues for safety                     Home Living Family/patient expects to be  discharged to:: Other (Comment) (per chart review, hotel ) Living Arrangements: Children (step-daughter) Available Help at Discharge: Other (Comment) (unsure of availability ) Type of Home: Other(Comment) Home Access: Level entry     Home Layout: One level               Home Equipment: Cane - single point          Prior Functioning/Environment Level of Independence: Independent with assistive device(s)        Comments: Uses cane        OT Problem List: Decreased strength;Impaired balance (sitting and/or standing);Decreased cognition;Decreased safety awareness;Decreased activity tolerance      OT Treatment/Interventions: Self-care/ADL training;DME and/or AE instruction;Therapeutic activities;Balance training;Therapeutic exercise;Patient/family education;Energy conservation    OT Goals(Current goals can be found in the care plan section) Acute Rehab OT Goals Patient Stated Goal: Not stated OT Goal Formulation: With patient Time For Goal Achievement: 04/06/17 Potential to Achieve Goals: Good  OT Frequency: Min 2X/week                             AM-PAC PT "6 Clicks" Daily Activity     Outcome Measure Help from another person eating meals?: None Help from another person taking care of personal grooming?: A Little Help from another person toileting, which includes using toliet, bedpan, or urinal?: A Little Help from another person bathing (including washing, rinsing, drying)?: A Lot Help from another person to put on and taking off regular upper body clothing?: A Little Help from another person to put on and taking off regular lower body clothing?: A Lot 6 Click Score: 17   End of Session Equipment Utilized During Treatment: Gait belt Nurse Communication: Mobility status  Activity Tolerance: Patient tolerated treatment well Patient left: in bed;with call bell/phone within reach;with bed alarm set  OT Visit Diagnosis: Unsteadiness on feet  (R26.81);Muscle weakness (generalized) (M62.81)                Time: 8185-6314 OT Time Calculation (min): 20 min Charges:  OT General Charges $OT Visit: 1 Procedure OT Evaluation $OT Eval Low Complexity: 1 Procedure G-Codes:     Miguel [name], OT Pager [phone] 03/23/2017   Miguel [name] 03/23/2017, 5:04 PM

## 2017-03-24 ENCOUNTER — Inpatient Hospital Stay (HOSPITAL_COMMUNITY): Payer: Medicare Other

## 2017-03-24 DIAGNOSIS — I361 Nonrheumatic tricuspid (valve) insufficiency: Secondary | ICD-10-CM

## 2017-03-24 DIAGNOSIS — G9340 Encephalopathy, unspecified: Secondary | ICD-10-CM

## 2017-03-24 LAB — ECHOCARDIOGRAM COMPLETE
HEIGHTINCHES: 65 in
Weight: 2428.8 oz

## 2017-03-24 MED ORDER — ATORVASTATIN CALCIUM 20 MG PO TABS: 20.0000 mg | ORAL_TABLET | Freq: Every day | ORAL | Status: DC

## 2017-03-24 MED ORDER — ENSURE ENLIVE PO LIQD: 237.0000 mL | Freq: Two times a day (BID) | ORAL | 12 refills | Status: DC

## 2017-03-24 MED ORDER — ADULT MULTIVITAMIN W/MINERALS CH: 1.0000 | ORAL_TABLET | Freq: Every day | ORAL | Status: DC

## 2017-03-24 MED ORDER — RIVAROXABAN 20 MG PO TABS: 20.0000 mg | ORAL_TABLET | Freq: Every day | ORAL | Status: DC

## 2017-03-24 MED ORDER — LEVETIRACETAM 500 MG PO TABS
500.0000 mg | ORAL_TABLET | Freq: Two times a day (BID) | ORAL | Status: DC
  Administered 2017-03-24: 500 mg via ORAL
  Filled 2017-03-24: qty 1

## 2017-03-24 MED ORDER — LEVETIRACETAM 500 MG PO TABS: 500.0000 mg | ORAL_TABLET | Freq: Two times a day (BID) | ORAL | Status: DC

## 2017-03-24 NOTE — Progress Notes (Signed)
report called to [facility]

## 2017-03-24 NOTE — Discharge Summary (Signed)
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  CL  --  116* 113* 111 110 107  CO2  --  19* 21* 23 20* 24  GLUCOSE  --  95 114* 92 113* 91  BUN  --  9 7 5* 10 9  CREATININE  --  1.22 1.05 1.00 0.87 0.90  CALCIUM  --  8.0* 8.1* 8.6* 8.8* 8.8*  MG 2.2 1.9 2.3 1.9 1.7  --   PHOS 3.2 2.1* 3.3 2.1*  --   --    Liver Function Tests:  Recent Labs Lab 03/17/17 1641  AST 31  ALT 16*  ALKPHOS 71  BILITOT 0.6  PROT 7.6  ALBUMIN 3.8   No results for input(s): LIPASE, AMYLASE in the last 168 hours. No results for input(s): AMMONIA in the last 168 hours. CBC:  Recent Labs Lab 03/17/17 1641  03/19/17 0250 03/20/17 0234 03/21/17 0951 03/22/17 0653 03/23/17 0613  WBC 19.4*  < > 12.5* 12.1* 9.1 10.2 10.9*  NEUTROABS 15.2*  --   --   --   --   --   --   HGB 16.0  < > 13.4 14.3 13.6 13.5 12.5*  HCT 47.4  < > 40.2 43.7 41.3 40.2 38.5*  MCV 91.0  < > 91.4 90.7 90.4 89.3 91.4  PLT 206  < > 160 175 192 197 210  < > = values in this interval not displayed. Cardiac Enzymes:  Recent Labs Lab 03/17/17 2009  CKTOTAL 633*   BNP: BNP (last 3 results) No results for input(s): BNP in the last 8760 hours.  ProBNP (last 3 results) No results for input(s): PROBNP in the last 8760 hours.  CBG:  Recent Labs Lab  03/20/17 2037 03/21/17 0013 03/21/17 0309 03/21/17 0815 03/21/17 1149  GLUCAP 122* 123* 110* 110* 129*       Signed:  Edsel Petrin  [facility] 03/24/2017, 1:16 PM

## 2017-03-24 NOTE — Care Management Important Message (Signed)
Important Message  Patient Details  Name: Miguel [name] MRN: [id number] Date of Birth: 1951-06-23   Medicare Important Message Given:  Yes    Dorena Bodo 03/24/2017, 2:09 PM

## 2017-03-24 NOTE — Care Management Note (Signed)
Case Management Note  Patient Details  Name: Miguel [name] MRN: [id number] Date of Birth: [birth year]  Subjective/Objective:         Admitted with Acute encephalopathy.           Action/Plan: Plan is to d/c to SNF today. CSW managing disposition to SNF.  Expected Discharge Date:    03/24/2017           Expected Discharge Plan:  Skilled Nursing Facility  In-House Referral:  Clinical Social Work  Discharge planning Services  CM Consult   Status of Service:  Completed, signed off  If discussed at [employer] of Stay Meetings, dates discussed:    Additional Comments:  Epifanio Lesches, RN 03/24/2017, 10:34 AM

## 2017-03-24 NOTE — Progress Notes (Signed)
  Echocardiogram 2D Echocardiogram has been performed.  Miguel [name] [name] 03/24/2017, 10:00 AM

## 2017-03-24 NOTE — Progress Notes (Signed)
Physical Therapy Treatment Patient Details Name: Miguel [name] MRN: [id number] DOB: Apr 02, 1951 Today's Date: 03/24/2017    History of Present Illness Pt adm 8/20 with presumed seizure. Intubated 8/20-8/22. 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 PMH - head injury, VP shunt, seizures, htn, etoh abuse.    PT Comments    Pt con't to demo both cognitive and functional deficits. Pt at increased fall risk. Pt with poor memory, problem solving, and sequencing. Per pt's daughter they are currently living in a hotel but she is looking for an apartment/house by this Friday. Pt to benefit from ST-SNF upon d/c to maximize functional and cognitive recovery to progress indep with mobility.   Follow Up Recommendations  SNF     Equipment Recommendations   (cane)    Recommendations for Other Services OT consult     Precautions / Restrictions Precautions Precautions: Fall Restrictions Weight Bearing Restrictions: No    Mobility  Bed Mobility Overal bed mobility: Modified Independent Bed Mobility: Supine to Sit     Supine to sit: HOB elevated;Modified independent (Device/Increase time)     General bed mobility comments: did long sit technique, no physical assist needed  Transfers Overall transfer level: Needs assistance Equipment used: None Transfers: Sit to/from Stand Sit to Stand: Min assist         General transfer comment: assist to steady upon standing; pt able to power up without assist  Ambulation/Gait Ambulation/Gait assistance: Min guard;Min assist Ambulation Distance (Feet): 120 Feet Assistive device: None;Straight cane Gait Pattern/deviations: Step-through pattern;Decreased step length - right;Decreased step length - left;Shuffle Gait velocity: decr Gait velocity interpretation: Below normal speed for age/gender General Gait Details: pt attempted to amb initially without AD however very unsteady and reaching for objects to hold onto. pt given straight cane. pt used  in L UE and demo'd safe technique however with very short step length, shuffling gait pattern   Stairs            Wheelchair Mobility    Modified Rankin (Stroke Patients Only)       Balance Overall balance assessment: Needs assistance Sitting-balance support: No upper extremity supported;Feet supported Sitting balance-Leahy Scale: Good Sitting balance - Comments: sitting EOB to adjust socks    Standing balance support: No upper extremity supported Standing balance-Leahy Scale: Poor Standing balance comment: pt attempted to urinate in standing but had to hold onto grab bar to steady self and prevent fall. pt leaned on coutner when washing hands in sink                            Cognition Arousal/Alertness: Awake/alert Behavior During Therapy: Flat affect Overall Cognitive Status: Impaired/Different from baseline Area of Impairment: Memory;Following commands;Safety/judgement;Problem solving                     Memory: Decreased short-term memory (recalled 0/3 with mini mental exam) Following Commands: Follows one step commands with increased time Safety/Judgement: Decreased awareness of safety;Decreased awareness of deficits   Problem Solving: Slow processing;Decreased initiation General Comments: requires freq step by step cues to complete task, increased time required.      Exercises      General Comments        Pertinent Vitals/Pain Pain Assessment: Faces Faces Pain Scale: No hurt    Home Living                      Prior Function  PT Goals (current goals can now be found in the care plan section) Acute Rehab PT Goals Patient Stated Goal: not stated Progress towards PT goals: Progressing toward goals    Frequency    Min 3X/week      PT Plan Current plan remains appropriate    Co-evaluation              AM-PAC PT "6 Clicks" Daily Activity  Outcome Measure  Difficulty turning over in bed  (including adjusting bedclothes, sheets and blankets)?: A Little Difficulty moving from lying on back to sitting on the side of the bed? : A Little Difficulty sitting down on and standing up from a chair with arms (e.g., wheelchair, bedside commode, etc,.)?: A Little Help needed moving to and from a bed to chair (including a wheelchair)?: A Little Help needed walking in hospital room?: A Lot Help needed climbing 3-5 steps with a railing? : A Lot 6 Click Score: 16    End of Session Equipment Utilized During Treatment: Gait belt Activity Tolerance: Patient tolerated treatment well Patient left: in chair;with call bell/phone within reach;with chair alarm set;with family/visitor present Nurse Communication: Mobility status PT Visit Diagnosis: Unsteadiness on feet (R26.81);Other abnormalities of gait and mobility (R26.89);Muscle weakness (generalized) (M62.81)     Time: 1610-9604 PT Time Calculation (min) (ACUTE ONLY): 30 min  Charges:  $Gait Training: 8-22 mins $Therapeutic Activity: 8-22 mins                    G Codes:       Lewis Shock, PT, DPT Pager #: [phone] Office #: [phone]    Josphine Laffey M Cabella Kimm 03/24/2017, 8:28 AM

## 2017-03-24 NOTE — Progress Notes (Signed)
Patient will DC to: [facility] Anticipated DC date: 03/24/17 Family notified: Step-daughter, [name] Transport by: Sharin Mons 3:30pm   Per MD patient ready for DC to [facility]. RN, patient, patient's family, and facility notified of DC. Discharge Summary sent to facility. RN given number for report. DC packet on chart. Ambulance transport requested for patient.   CSW signing off.  Cristobal Goldmann, [place] Clinical Social Worker [phone]

## 2017-03-24 NOTE — Progress Notes (Signed)
VASCULAR LAB PRELIMINARY  PRELIMINARY  PRELIMINARY  PRELIMINARY  Carotid duplex completed.    Preliminary report:  Bilateral:  1-39% ICA stenosis.  Vertebral artery flow is antegrade on the right. Left vertebral artery was not able to be insonated.    Mariely Mahr, RVS 03/24/2017, 11:17 AM

## 2017-03-24 NOTE — Discharge Instructions (Signed)

## 2017-03-24 NOTE — Clinical Social Work Placement (Signed)
   CLINICAL SOCIAL WORK PLACEMENT  NOTE  Date:  03/24/2017  Patient Details  Name: Miguel [name] MRN: [id number] Date of Birth: 05/02/1951  Clinical Social Work is seeking post-discharge placement for this patient at the Skilled  Nursing Facility level of care (*CSW will initial, date and re-position this form in  chart as items are completed):  Yes   Patient/family provided with [place] Clinical Social Work Department's list of facilities offering this level of care within the geographic area requested by the patient (or if unable, by the patient's family).  Yes   Patient/family informed of their freedom to choose among providers that offer the needed level of care, that participate in Medicare, Medicaid or managed care program needed by the patient, have an available bed and are willing to accept the patient.  Yes   Patient/family informed of 's ownership interest in [facility] and [facility], as well as of the fact that they are under no obligation to receive care at these facilities.  PASRR submitted to EDS on       PASRR number received on       Existing PASRR number confirmed on 03/20/17     FL2 transmitted to all facilities in geographic area requested by pt/family on 03/20/17     FL2 transmitted to all facilities within larger geographic area on       Patient informed that his/her managed care company has contracts with or will negotiate with certain facilities, including the following:        Yes   Patient/family informed of bed offers received.  Patient chooses bed at [facility]     Physician recommends and patient chooses bed at      Patient to be transferred to [facility] on 03/24/17.  Patient to be transferred to facility by [facility]     Patient family notified on 03/24/17 of transfer.  Name of family member notified:  [name]     PHYSICIAN       Additional Comment:     _______________________________________________ Mearl Latin, LCSWA 03/24/2017, 1:54 PM

## 2017-03-25 LAB — VAS US CAROTID
LCCADDIAS: -17 cm/s
LCCADSYS: -83 cm/s
LCCAPDIAS: 17 cm/s
LEFT ECA DIAS: -8 cm/s
LICADDIAS: -21 cm/s
LICAPDIAS: -20 cm/s
Left CCA prox sys: 80 cm/s
Left ICA dist sys: -80 cm/s
Left ICA prox sys: -67 cm/s
RCCADSYS: -105 cm/s
RCCAPDIAS: 16 cm/s
RCCAPSYS: 82 cm/s
RIGHT ECA DIAS: -14 cm/s
RIGHT VERTEBRAL DIAS: -14 cm/s

## 2017-04-16 IMAGING — CR DG CHEST 2V
2 series · 2 of 2 positions shown · non-contrast
Comparison: 08/26/2015

CLINICAL DATA: Found on the side of the road by [HOSPITAL] Police
Department with an empty vodka bottle, congestion, wheezing

EXAM:
CHEST  2 VIEW

[w chest lat]
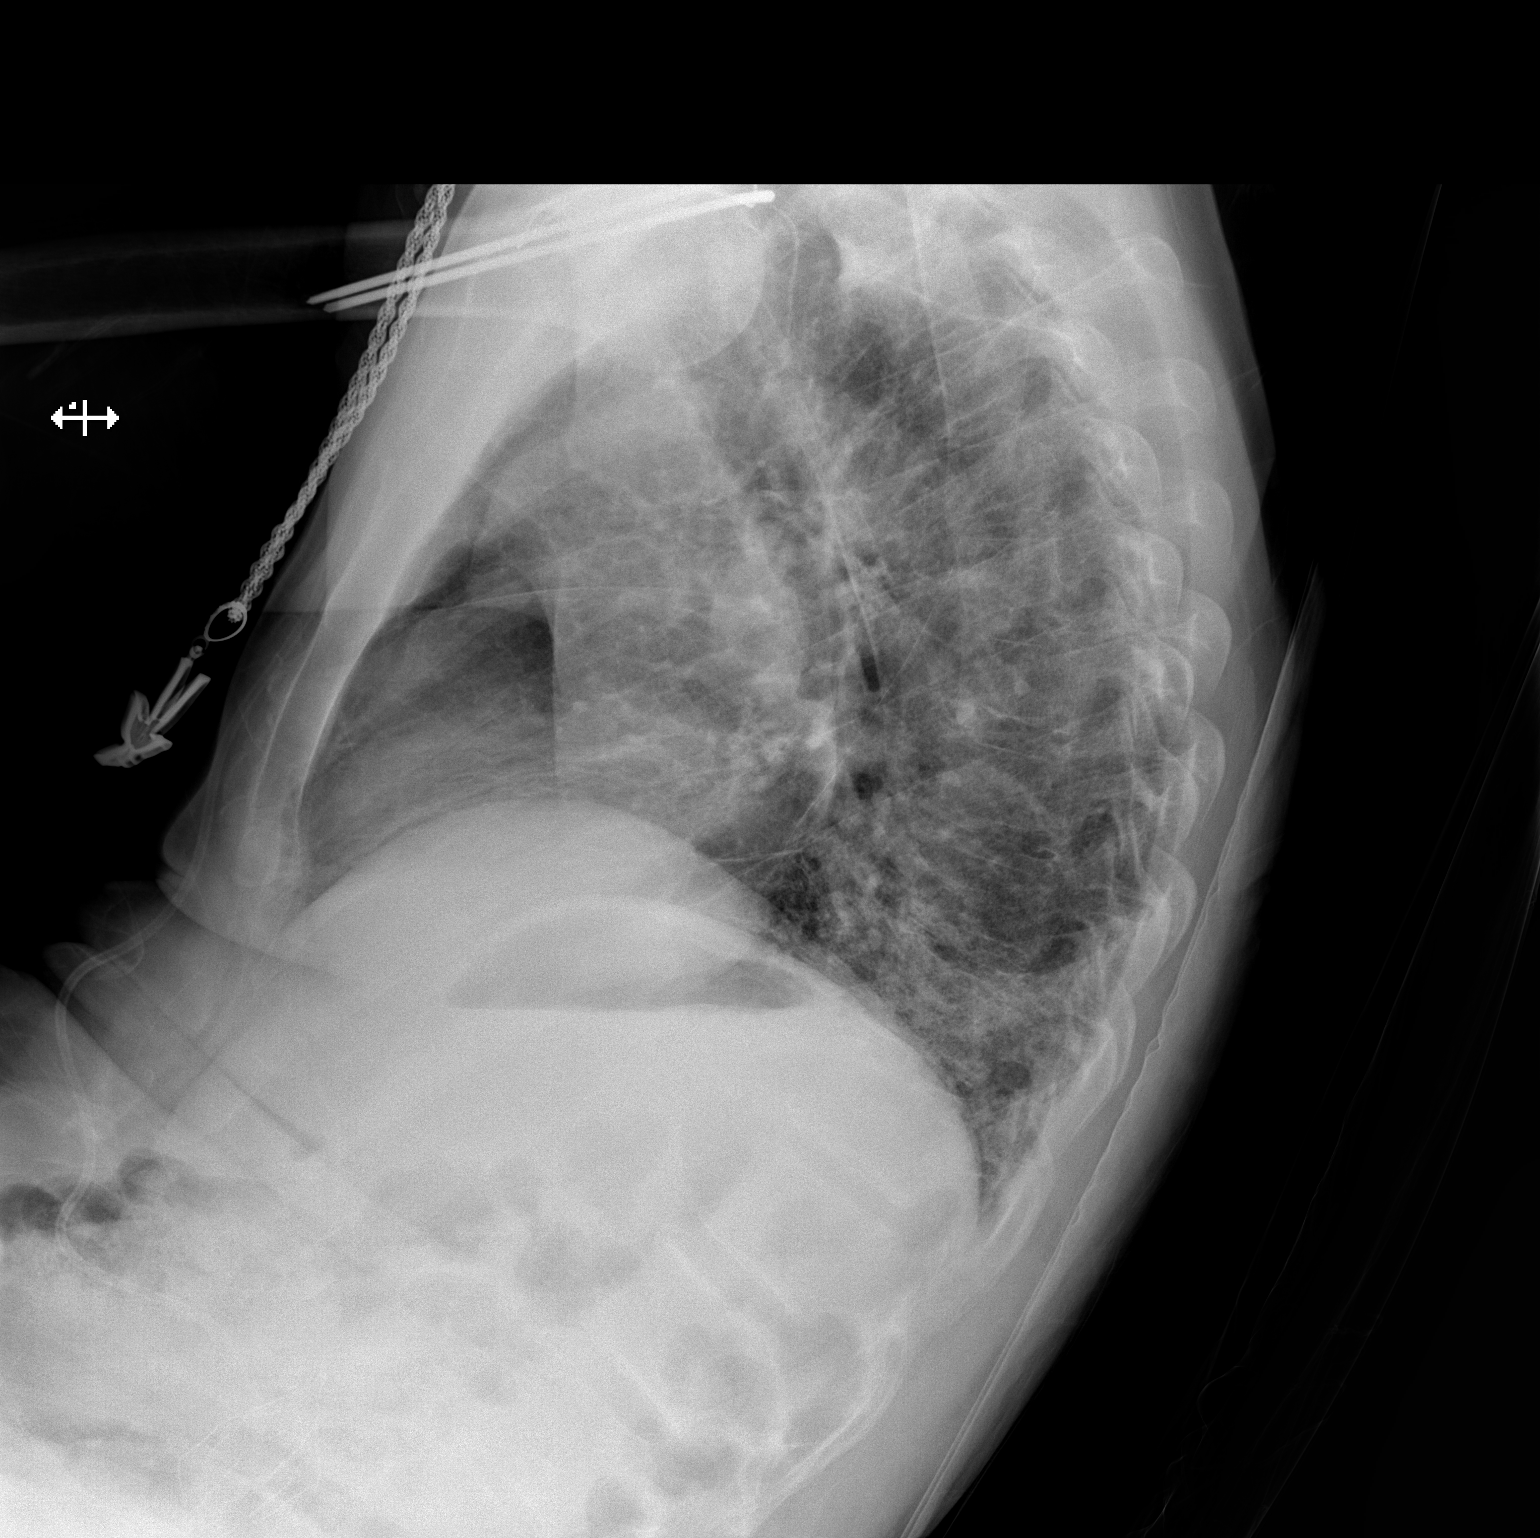

[x chest ap]
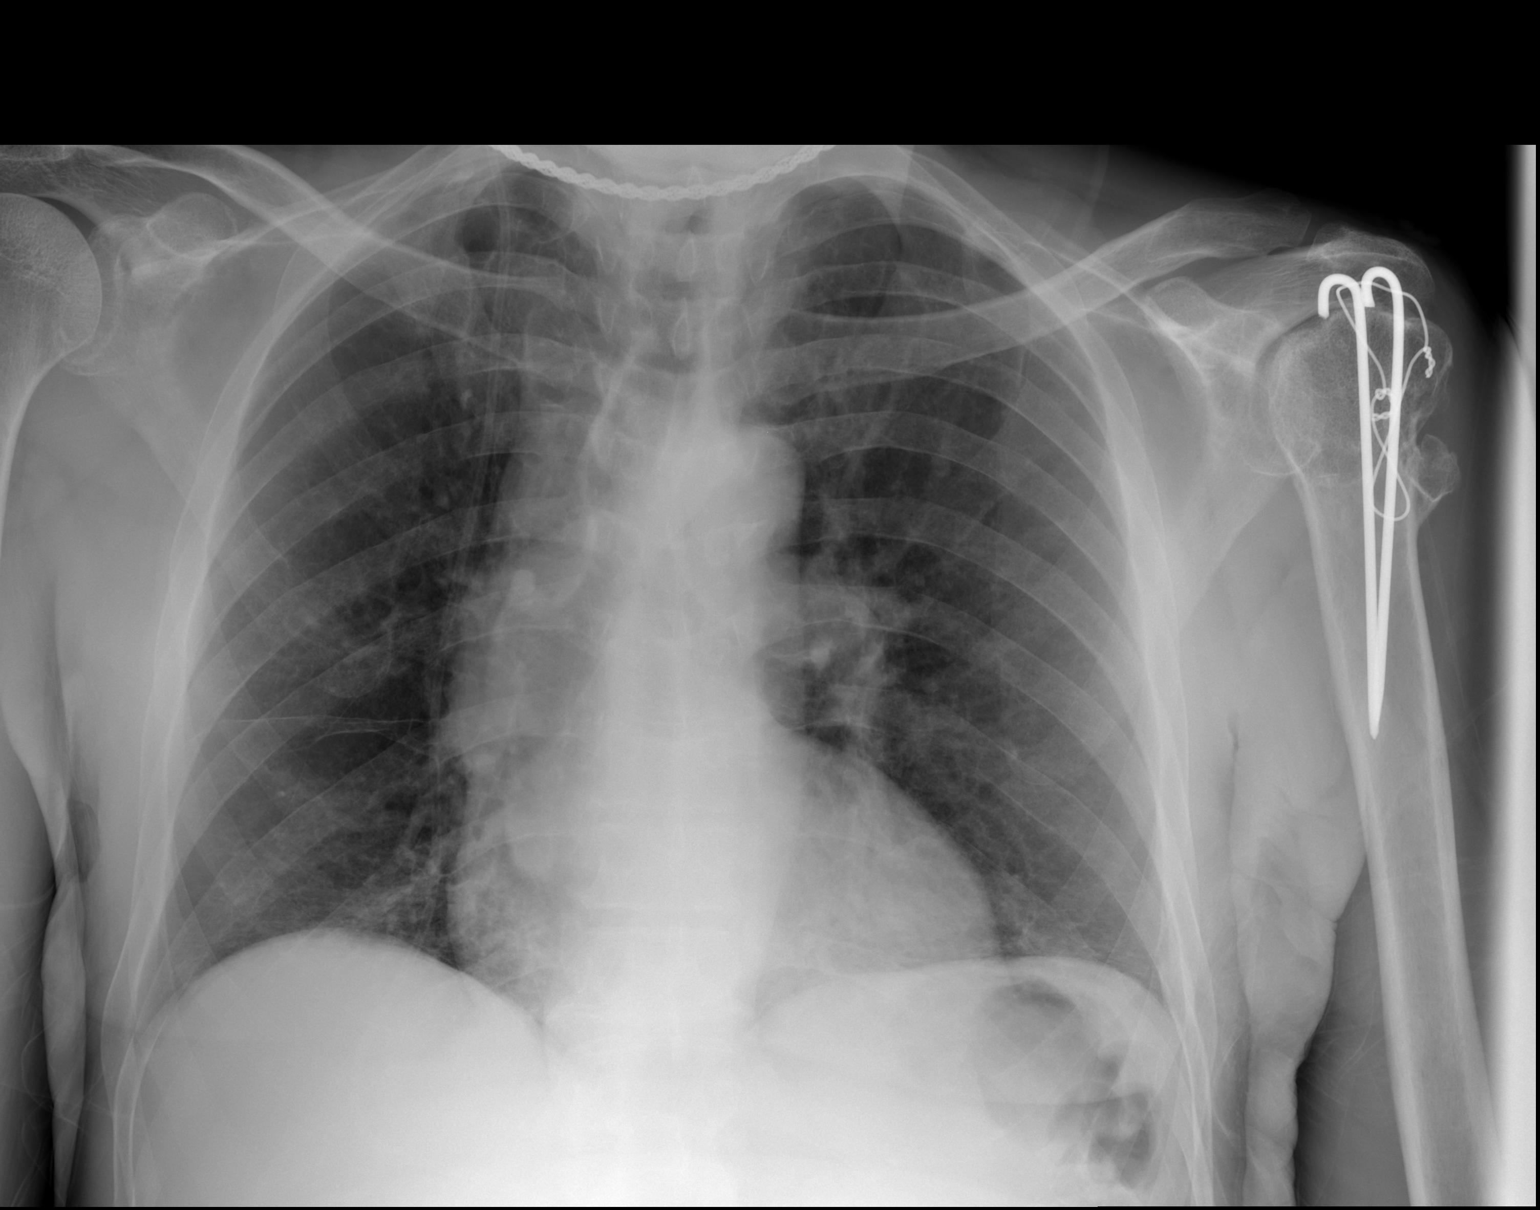

[2 of 2 positions shown; findings below may reference images not displayed]

FINDINGS: VP shunt tubing traverses RIGHT hemithorax.

Upper normal heart size.

Mediastinal contours and pulmonary vascularity normal.

Bibasilar atelectasis.

No definite infiltrate, pleural effusion or pneumothorax.

Bones appear demineralized.
IMPRESSION: Bibasilar atelectasis.

## 2017-08-11 ENCOUNTER — Ambulatory Visit: Payer: Medicare Other | Admitting: Diagnostic Neuroimaging

## 2017-08-12 ENCOUNTER — Encounter: Payer: Self-pay | Admitting: Diagnostic Neuroimaging

## 2017-08-16 IMAGING — US US PELVIS LIMITED
1 series · 13 of 17 positions shown · non-contrast
Comparison: CT pelvis from earlier today.

CLINICAL DATA: Right groin pain.  History of IVC filter.

EXAM:
LIMITED ULTRASOUND OF PELVIS
TECHNIQUE: Limited transabdominal ultrasound examination of the pelvis was
performed.

[Series 1: us pelvis limited · 0.08mm/px · 17 acquisitions, 13 frames shown]
[im 1/17]
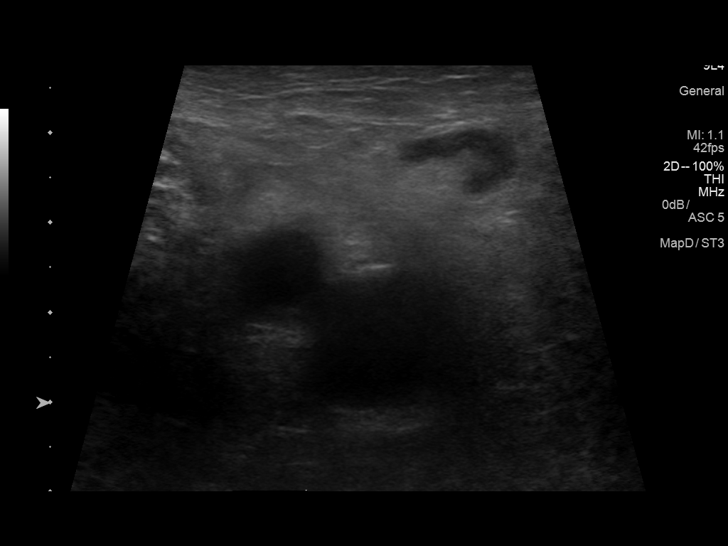
[im 2/17]
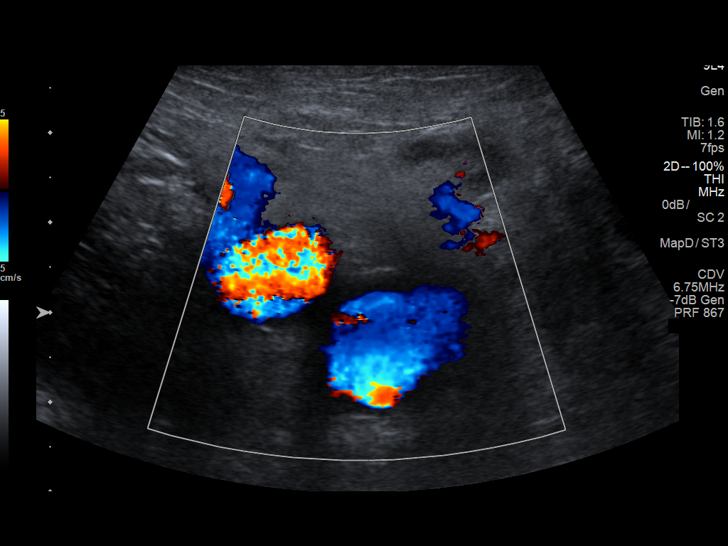
[im 4/17]
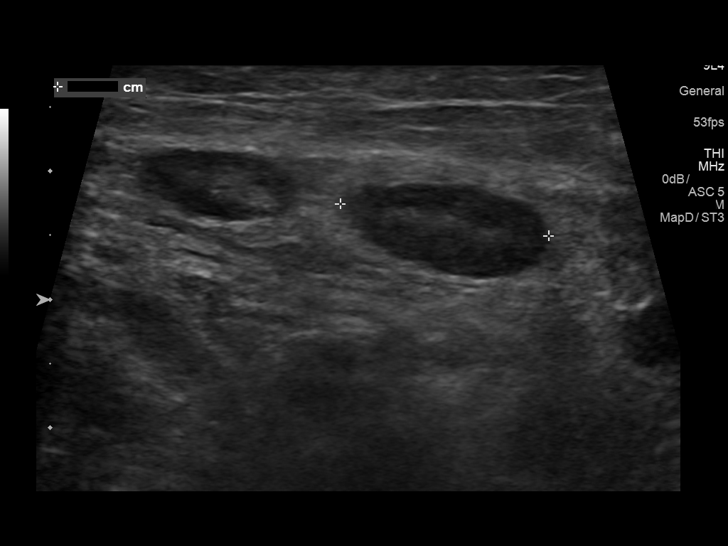
[im 5/17]
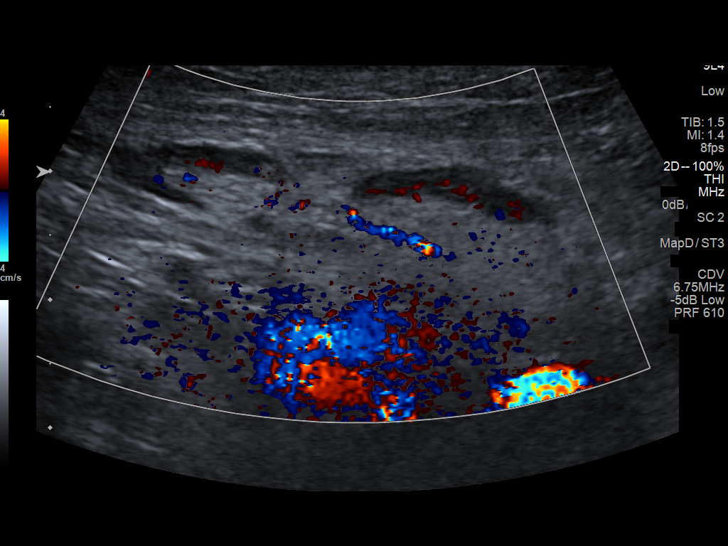
[im 6/17]
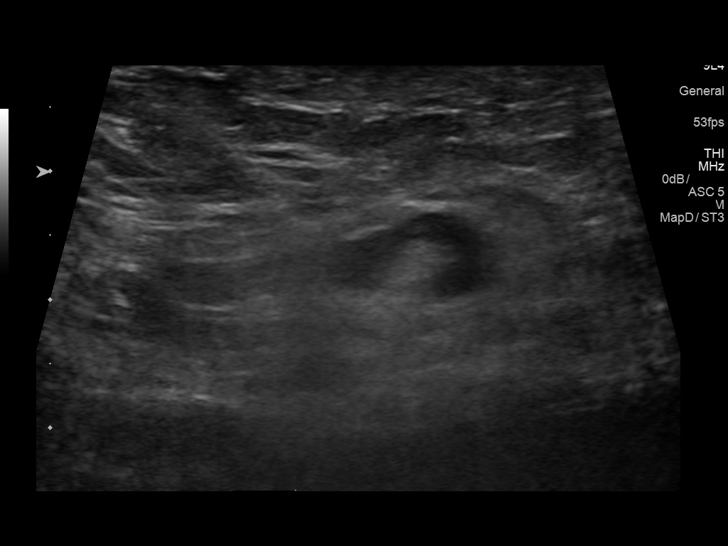
[im 8/17]
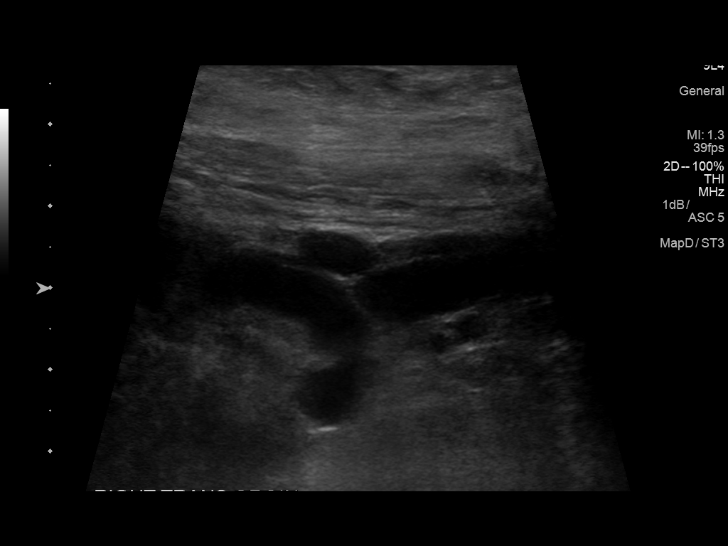
[im 9/17]
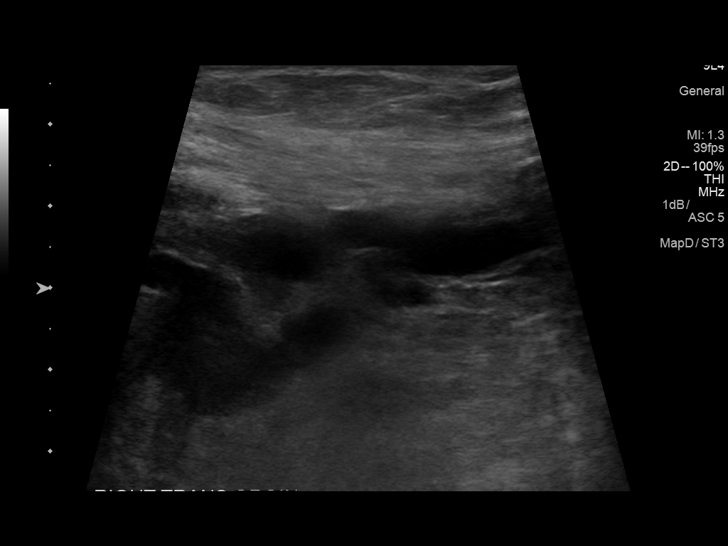
[im 10/17]
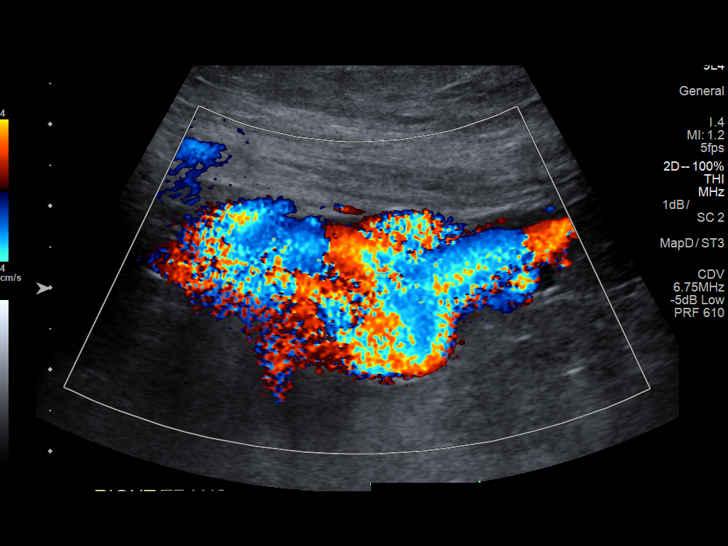
[im 12/17]
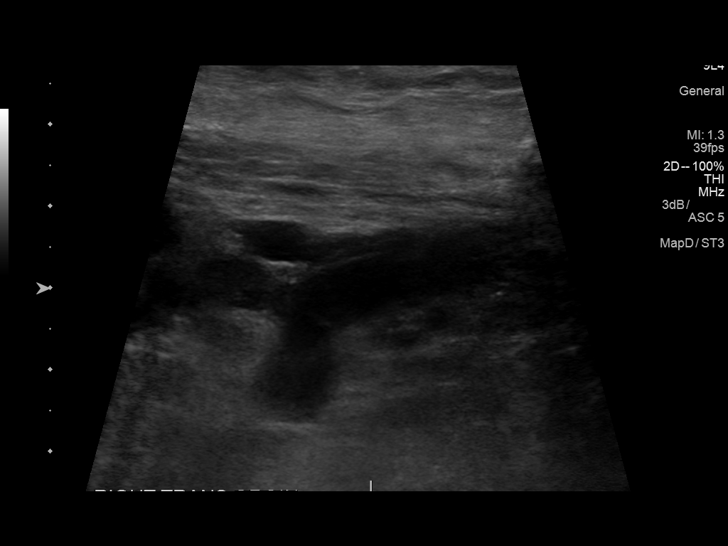
[im 13/17]
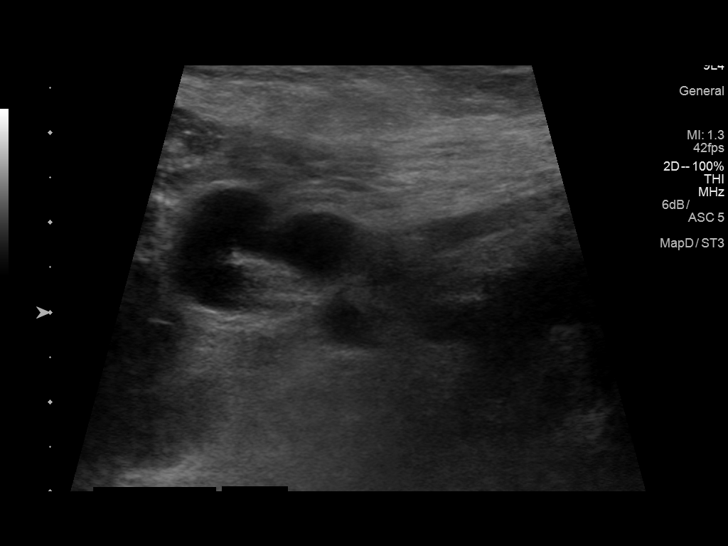
[im 14/17]
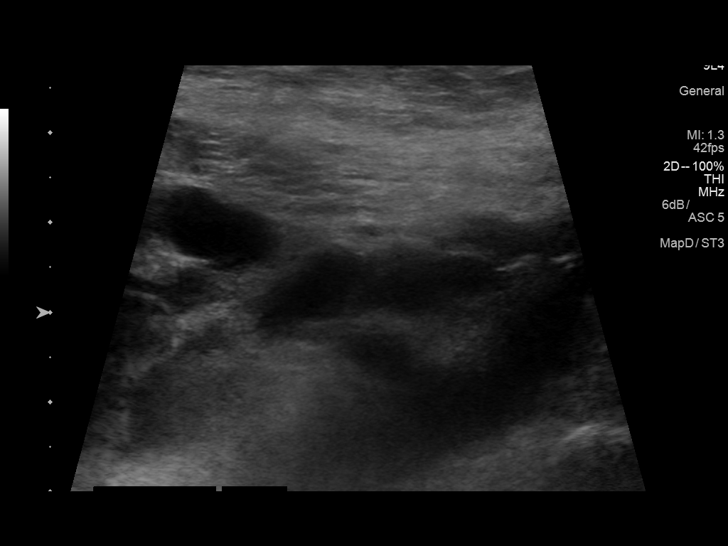
[im 16/17]
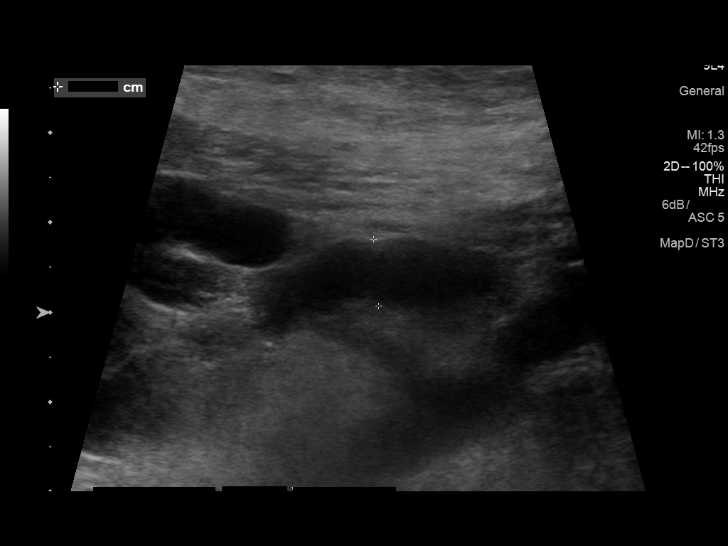
[im 17/17]
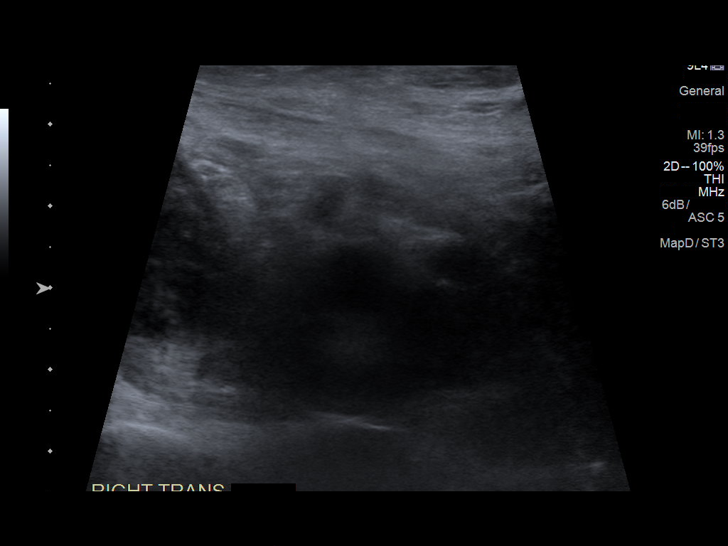

[13 of 17 positions shown; findings below may reference images not displayed]

FINDINGS: Targeted ultrasound of the right groin demonstrates clustered
tortuous dilated vessels measuring up to 0.8 cm in diameter, which
appear to be traversing inferiorly into the right inguinal canal and
superiorly into the right lower quadrant of the abdomen. There are
no abnormal masses or fluid collections demonstrated. There are two
sonographically benign-appearing adjacent right inguinal lymph nodes
measuring 0.7 cm and 0.5 cm in short axis with no hypervascularity,
no effacement of the fatty hilum and no cortical heterogeneity.
IMPRESSION: 1. Clustered dilated tortuous vessels in the right groin traveling
inferiorly in the right inguinal canal and superiorly into the right
lower quadrant of the abdomen. Based on correlation with the CT
study performed earlier today, these likely represent markedly
dilated venous collaterals draining the right lower extremity via
the right gonadal vein (bypassing the IVC filter) and suggest
chronic thrombosis of the IVC below the level of the filter.
2. No abnormal mass or fluid collection in the right groin.

## 2018-04-03 IMAGING — CT CT CERVICAL SPINE W/O CM
5 of 8 series · 12 of 33 positions shown, 13 images · non-contrast
Comparison: Head CT 09/29/2015

CLINICAL DATA: Altered mental status

EXAM:
CT HEAD WITHOUT CONTRAST
CT CERVICAL SPINE WITHOUT CONTRAST
TECHNIQUE: Multidetector CT imaging of the head and cervical spine was
performed following the standard protocol without intravenous
contrast. Multiplanar CT image reconstructions of the cervical spine
were also generated.

[Series 5: head bone · axial · 0.42mm/px · z∈[-100,-42]mm · 2 of 87 slices shown]
[im 29/87  bone]
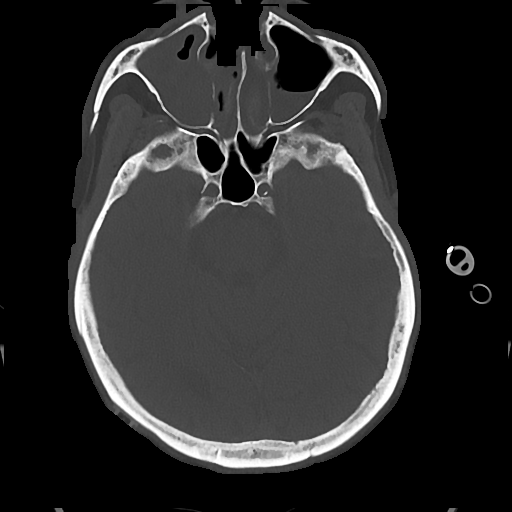
[im 58/87  bone]
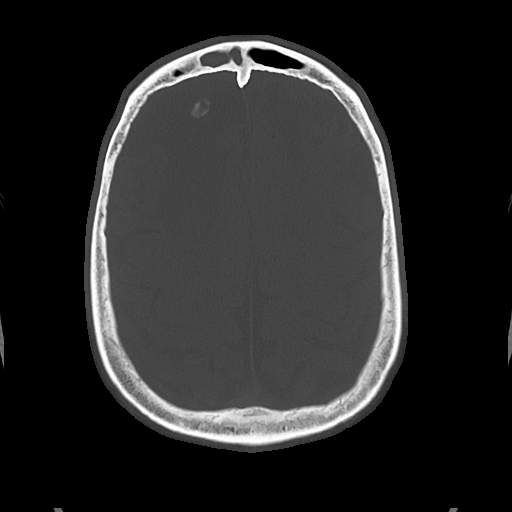

[Series 9: c spine soft · axial · 0.30mm/px · z∈[-235,-165]mm · 2 of 107 slices shown]
[im 36/107  soft-tissue]
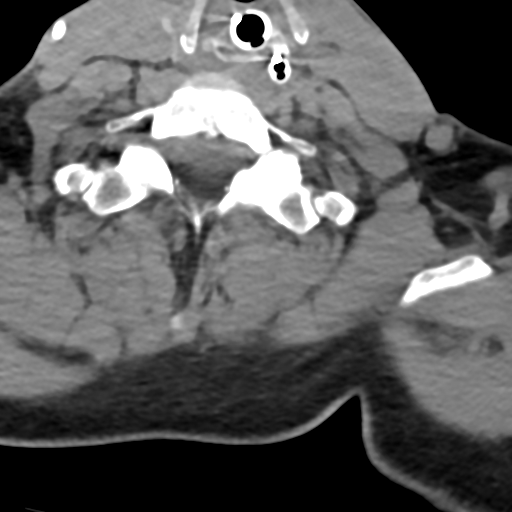
[im 71/107  soft-tissue]
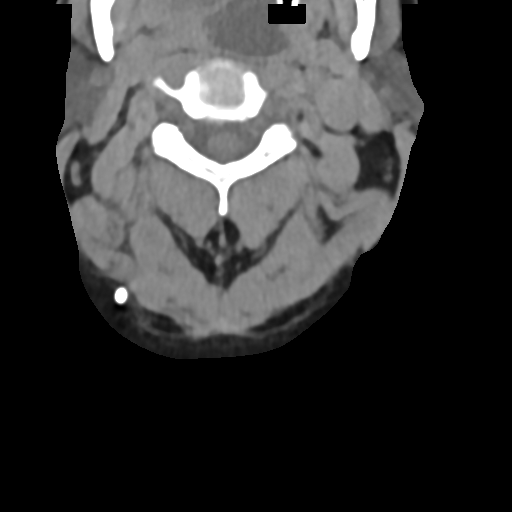

[Series 10: sag bone · sagittal · 0.28mm/px · 4 of 61 slices shown]
[im 13/61  bone]
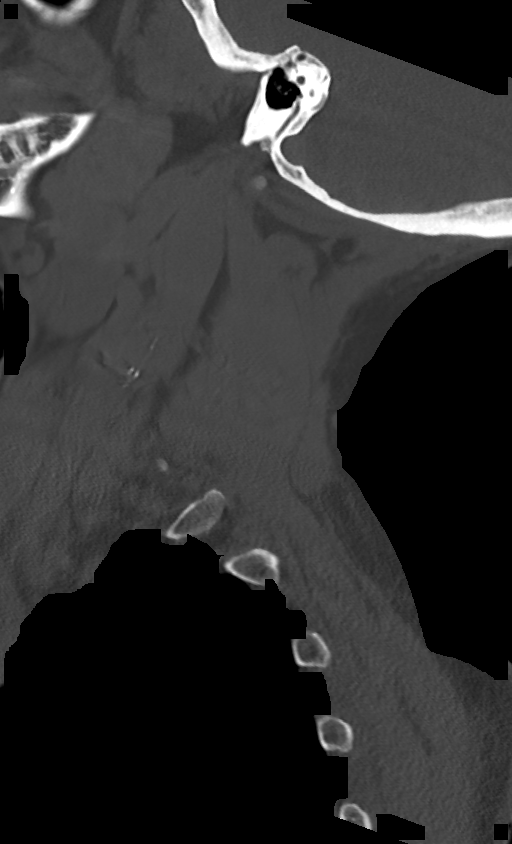
[im 25/61  bone]
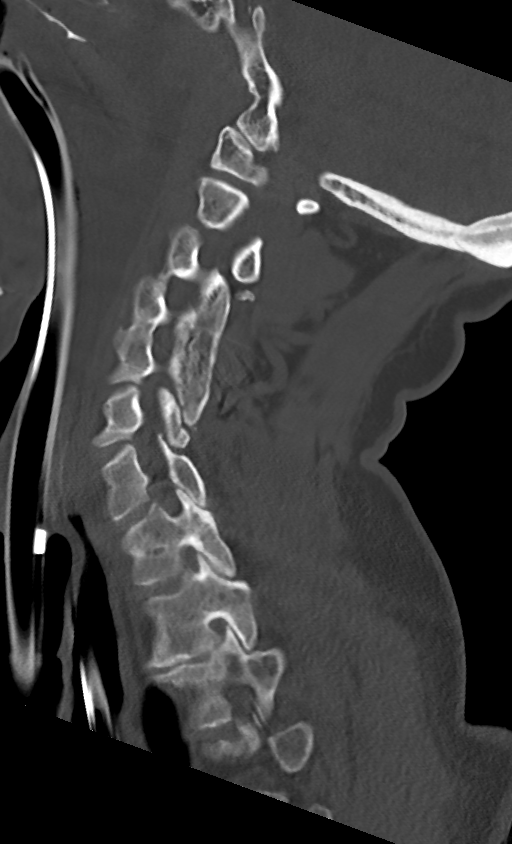
[im 37/61  bone]
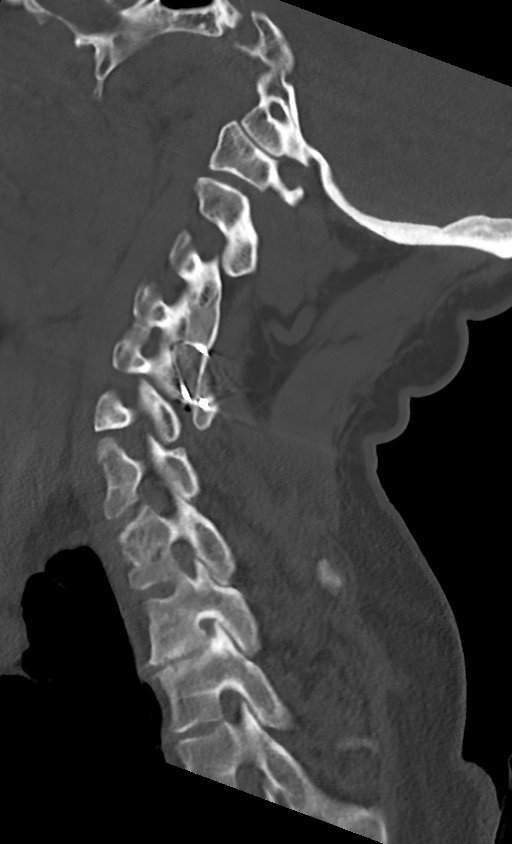
[im 49/61  bone]
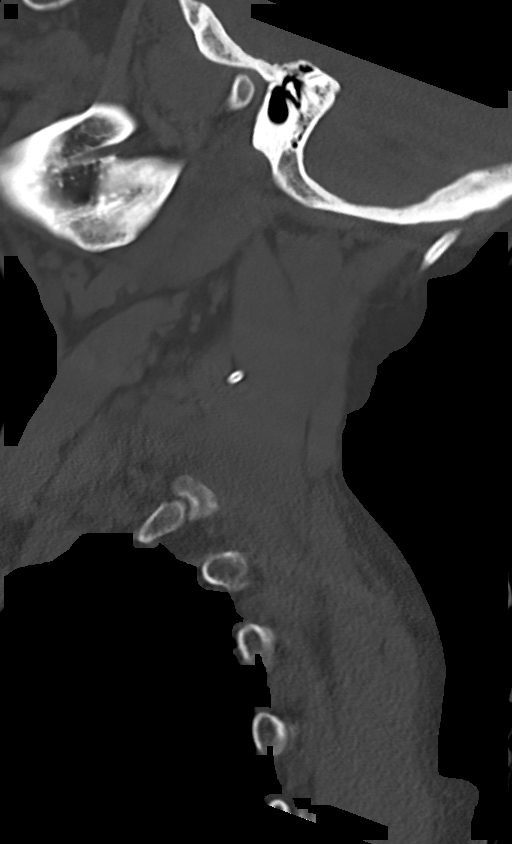

[Series 11: cor bone · coronal · 0.24mm/px · 1 of 61 slices shown]
[im 31/61  bone]
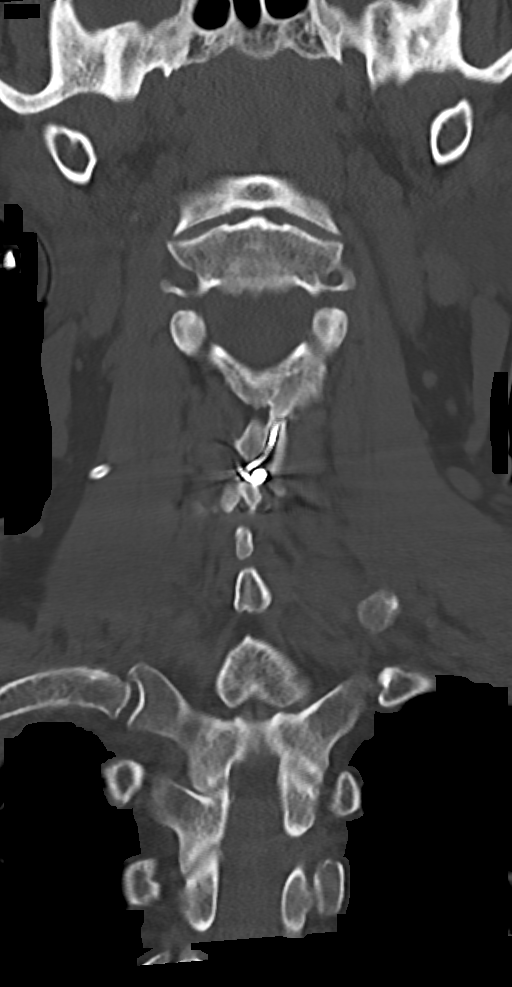

[Series 12: orthogonal axials · axial · 0.21mm/px · z∈[-271,-162]mm · 3 of 111 slices shown, 4 images]
[im 28/111  soft-tissue]
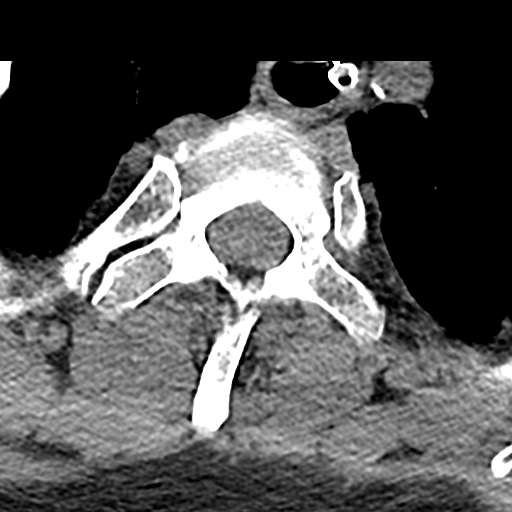
[im 28/111  bone]
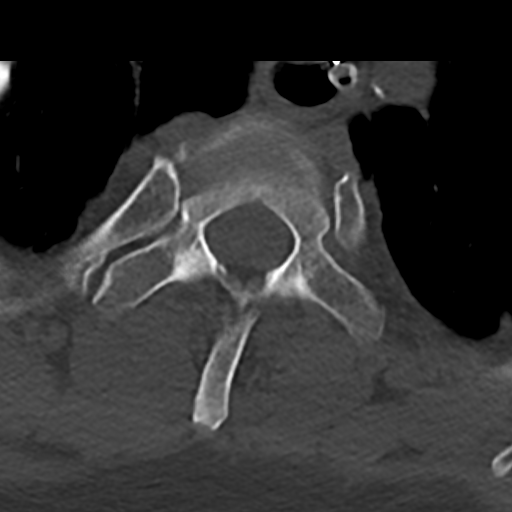
[im 56/111  bone]
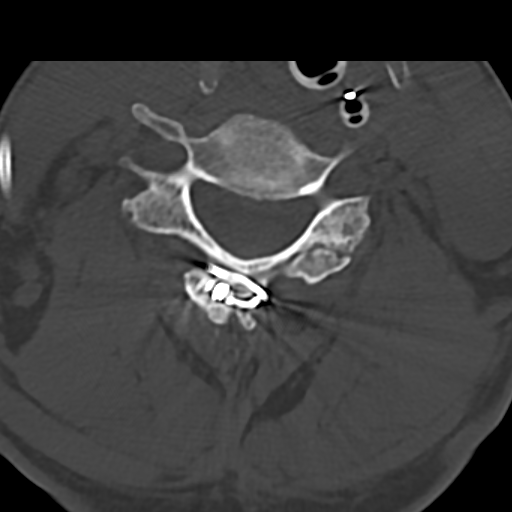
[im 83/111  bone]
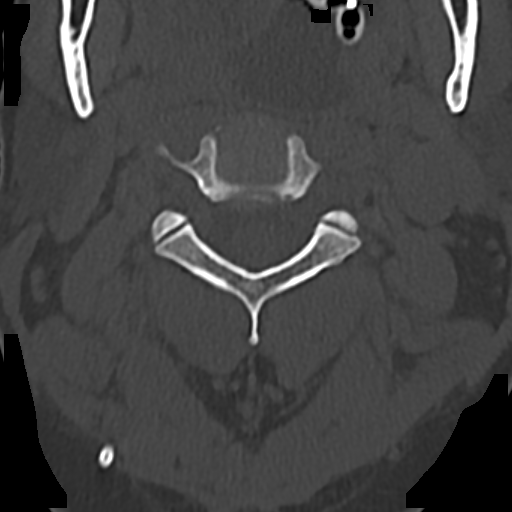

[12 of 33 positions shown; findings below may reference images not displayed]

FINDINGS: CT HEAD FINDINGS

Brain: Right parietal approach shunt catheter tip adjacent to the
septum pellucidum is unchanged in position. The size and
configuration of the dilated ventricles are unchanged. Bifrontal
encephalomalacia in a pattern consistent with remote trauma is
unchanged. Area of extra-axial calcification at the right frontal
pole is unchanged. No acute hemorrhage. No midline shift or other
mass effect.

Vascular: No hyperdense vessel or unexpected calcification.

Skull: Right parietal burr hole.  No calvarial fracture.

Sinuses/Orbits: Extensive opacification of the paranasal sinuses,
nasopharynx and upper oropharynx, likely due at least in part to the
presence of endotracheal tube. Normal orbits.

CT CERVICAL SPINE FINDINGS

Alignment: No static subluxation. Facets are aligned. Occipital
condyles are normally positioned.

Skull base and vertebrae: No acute fracture.  C3-C5 fusion.

Soft tissues and spinal canal: No prevertebral fluid or swelling. No
visible canal hematoma.

Disc levels: No advanced spinal canal or neural foraminal stenosis.

Upper chest: Biapical emphysema.

Other: Normal visualized paraspinal cervical soft tissues.
IMPRESSION: 1. Unchanged size and configuration of the shunted ventricles and
unchanged bifrontal encephalomalacia.
2. No acute intracranial abnormality.
3. No acute fracture or static subluxation of the cervical spine.

## 2018-04-03 IMAGING — DX DG ABD PORTABLE 1V
1 series · 1 of 1 positions shown · non-contrast
Comparison: None.

CLINICAL DATA: Encounter for orogastric tube placement

EXAM:
PORTABLE ABDOMEN - 1 VIEW

[abdomen]
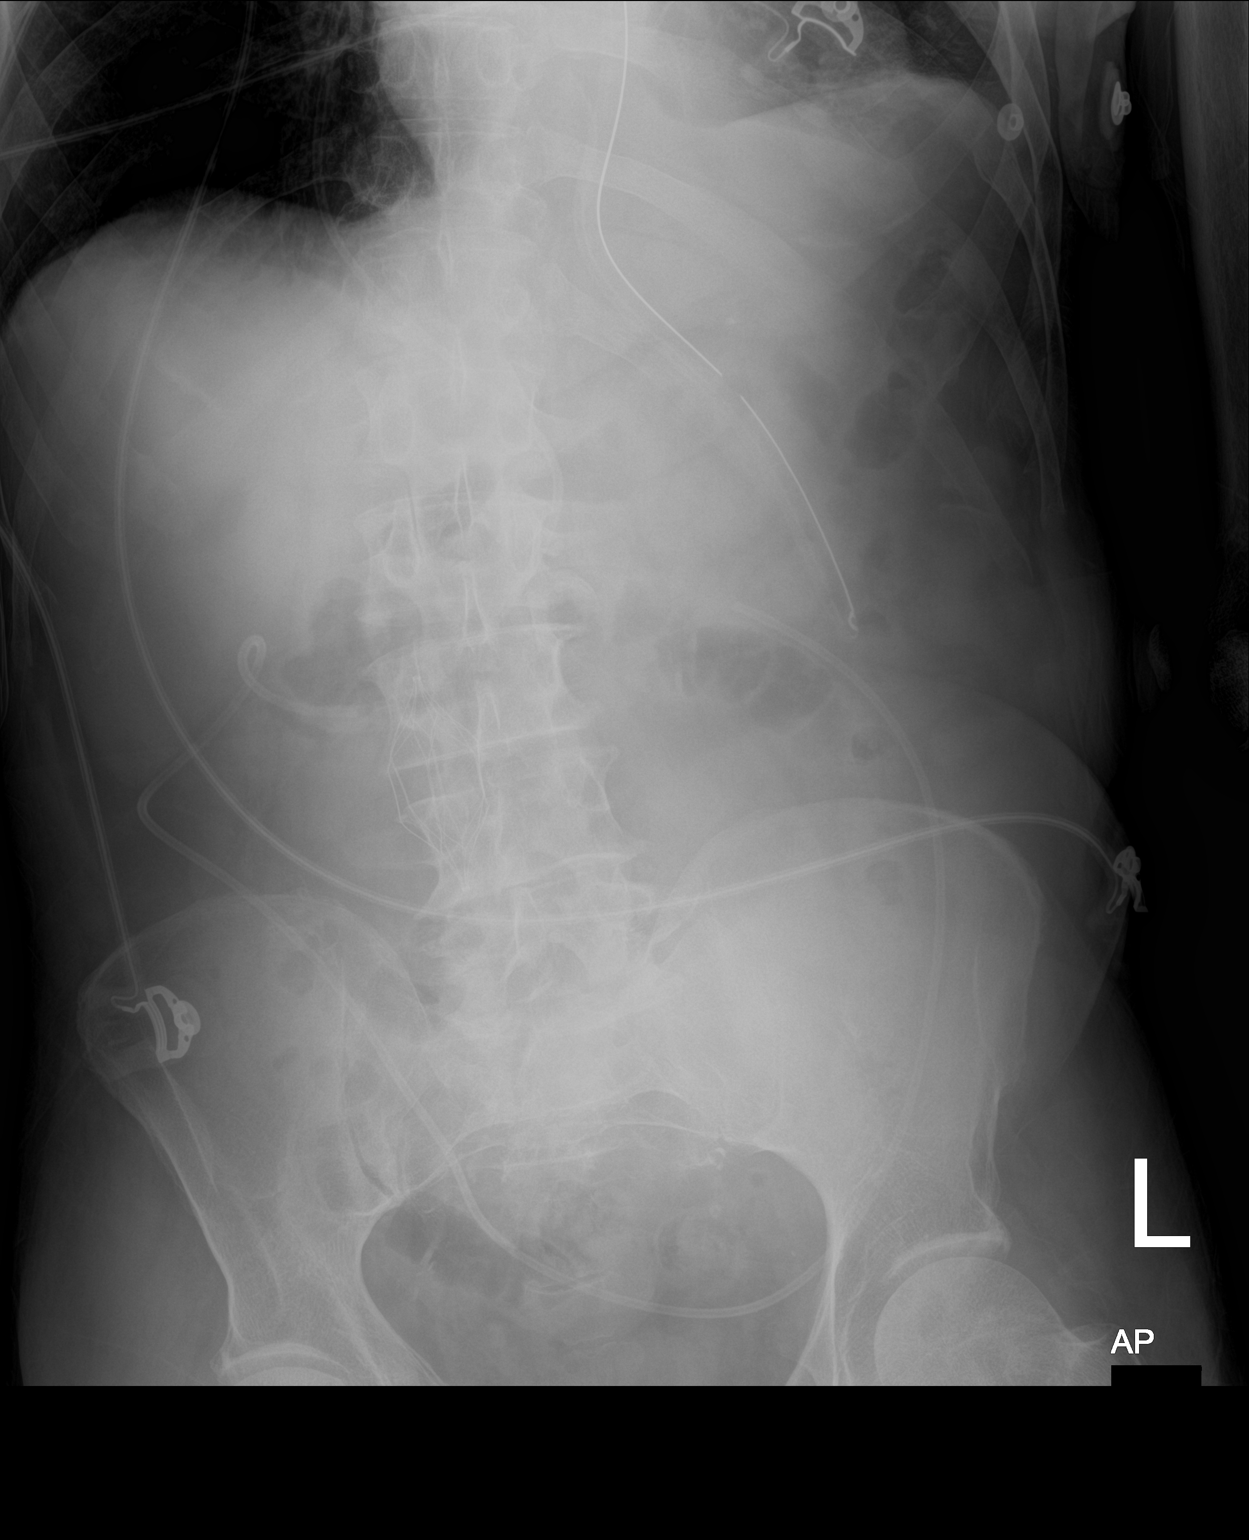

[1 of 1 positions shown; findings below may reference images not displayed]

FINDINGS: The tip and side port of a gastric tube are seen in the left upper
quadrant of the abdomen in the expected location of the stomach. A
ventriculoperitoneal shunt tip is seen in the left hemiabdomen as
well. There is an IVC filter, right paraspinal in location spanning
the superior endplate of L3 through inferior endplate of L4. No
radio-opaque calculi or other significant radiographic abnormality
are seen. Mild probe projects over the expected location of the
bladder. Bowel gas pattern is unremarkable. No free air.
IMPRESSION: Orogastric tube in the expected location of the stomach.
Unremarkable bowel gas pattern.

## 2018-04-03 IMAGING — DX DG CHEST 1V PORT
1 series · 1 of 1 positions shown · non-contrast
Comparison: Chest x-ray of March 30, 2016

CLINICAL DATA: Intubated patient. Current smoker. A history of
atrial fibrillation, seizure activity, alcohol abuse.

EXAM:
PORTABLE CHEST 1 VIEW

[chest ap]
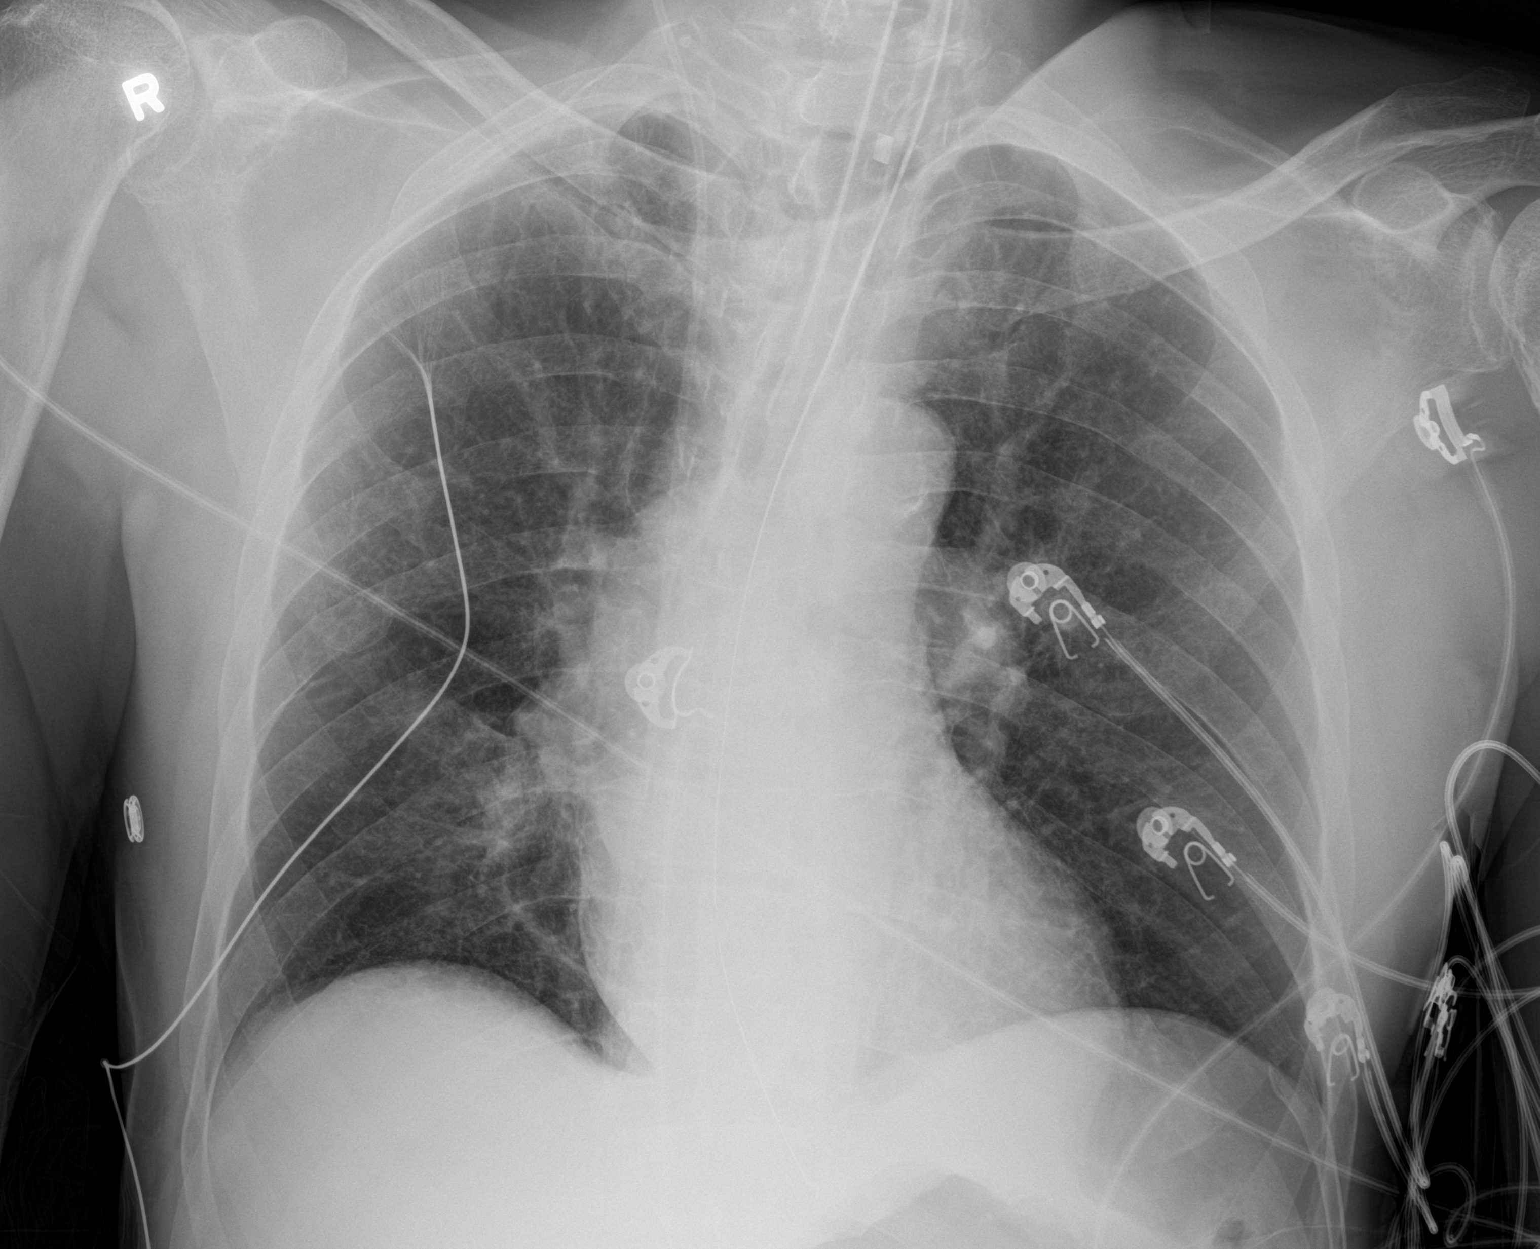

[1 of 1 positions shown; findings below may reference images not displayed]

FINDINGS: The lungs are well-expanded. There is no focal infiltrate. The
interstitial markings are mildly prominent but not greatly changed
from the previous study. The heart is normal in size. The pulmonary
vascularity is mildly prominent centrally. The endotracheal tube tip
lies approximately 4.7 cm above the carina. The esophagogastric tube
tip projects at the GE junction with the proximal port in the distal
esophagus. The right internal jugular venous catheter tip projects
at the cavoatrial junction.
IMPRESSION: Mild pulmonary interstitial prominence and central pulmonary
vascular congestion without significant cardiomegaly. This may
reflect the patient's volume status.

Advancement of the nasogastric tube by approximately 15 cm is needed
to assure that both the proximal port and tip lie below the GE
junction. Withdrawal of the right internal jugular venous catheter
by approximately 5 cm would assure that it does not enter the
atrium.

Thoracic aortic atherosclerosis.

## 2018-04-04 IMAGING — DX DG CHEST 1V PORT
1 series · 1 of 1 positions shown · non-contrast
Comparison: 03/17/2017.

CLINICAL DATA: Intubation.

EXAM:
PORTABLE CHEST 1 VIEW

[chest]
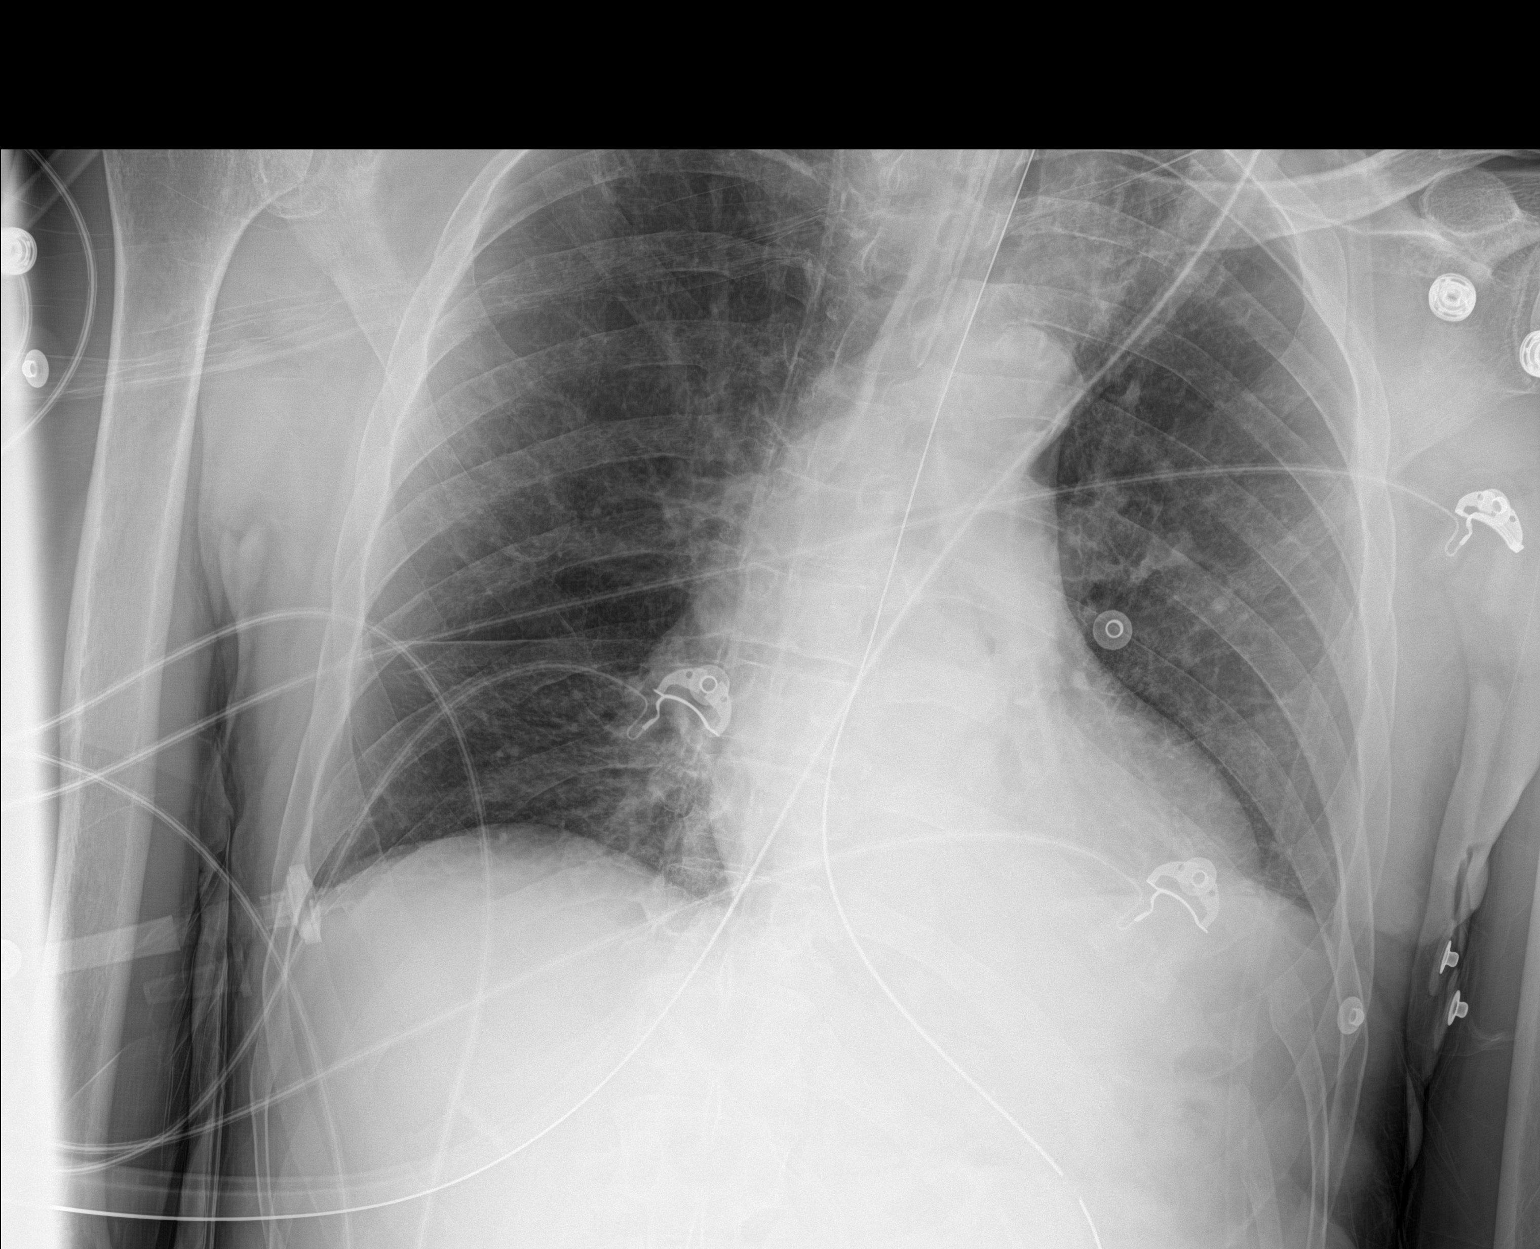

[1 of 1 positions shown; findings below may reference images not displayed]

FINDINGS: Interim advancement of NG tube, its tip and side hole below the left
hemidiaphragm. Endotracheal tube, right IJ line in stable position.
Heart size stable. Atelectatic changes left lower lobe. Tiny left
pleural effusion. No pneumothorax.
IMPRESSION: 1. Interim advancement of NG tube, its tip and side hole below the
left hemidiaphragm. Endotracheal tube and right IJ line stable
position.

2. Atelectatic changes left lower lobe. Small left pleural effusion.

3.  No evidence of pulmonary venous congestion on today's exam.

## 2018-04-04 IMAGING — MR MR HEAD W/O CM
8 of 10 series · 34 of 48 positions shown · non-contrast
Comparison: CT head 03/17/2017.  MR head 10/14/2016.

CLINICAL DATA: 65-year-old male with past medical history of closed
head injury requiring VP shunt, and history of seizures, was found
unresponsive 03/17/2017.

EXAM:
MRI HEAD WITHOUT CONTRAST
TECHNIQUE: Multiplanar, multiecho pulse sequences of the brain and surrounding
structures were obtained without intravenous contrast.

[Series 3: DWI · axial · 3.0mm · 1.09mm/px · z∈[-40,+104]mm · 8 of 98 slices shown (1 of 4)]
[im 1/98]
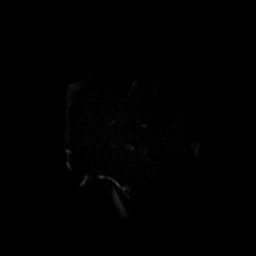
[im 11/98]
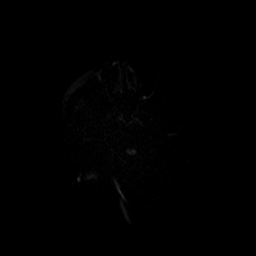
[im 33/98]
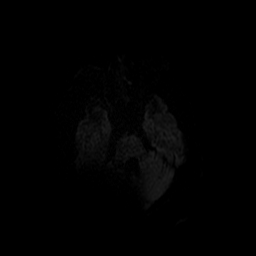
[im 44/98]
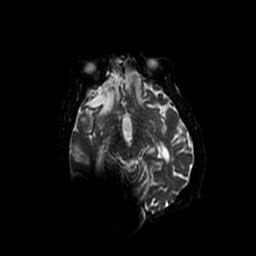
[im 54/98]
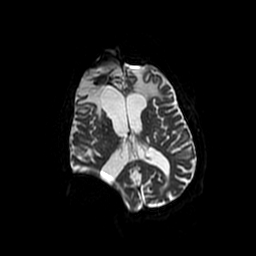
[im 65/98]
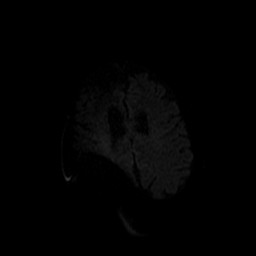
[im 87/98]
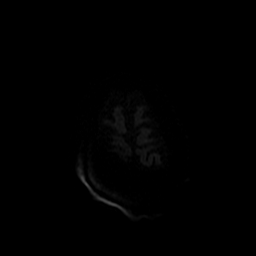
[im 98/98]
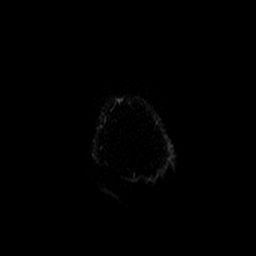

[Series 4: DWI · coronal · 5.0mm · 1.09mm/px · 7 of 64 slices shown (2 of 4)]
[im 1/64]
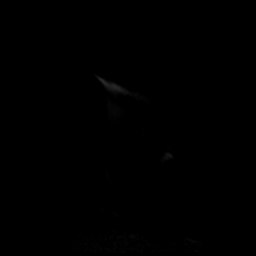
[im 11/64]
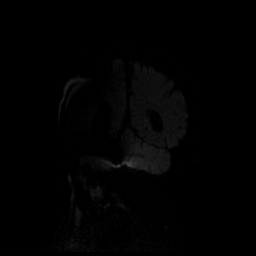
[im 22/64]
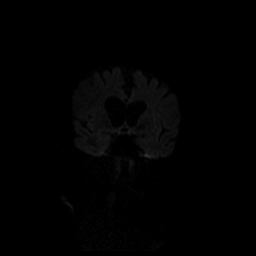
[im 32/64]
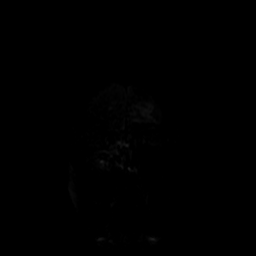
[im 43/64]
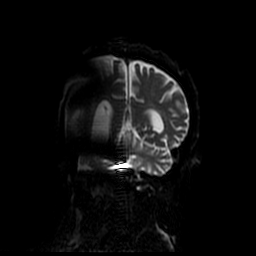
[im 53/64]
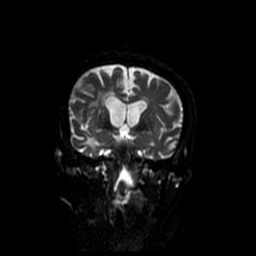
[im 64/64]
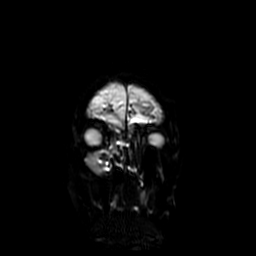

[Series 5: T1 · sagittal · 5.0mm · 0.47mm/px · 3 of 25 slices shown]
[im 1/25]
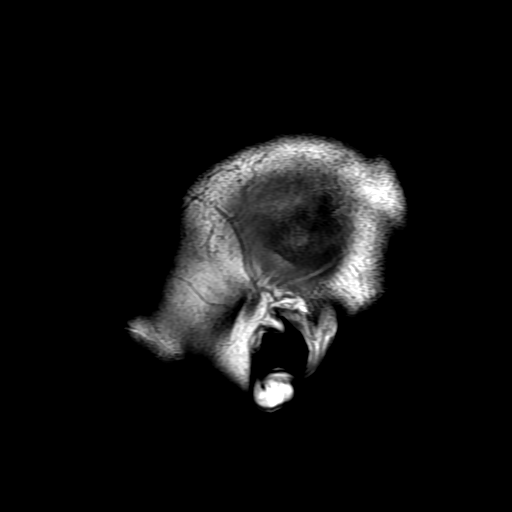
[im 13/25]
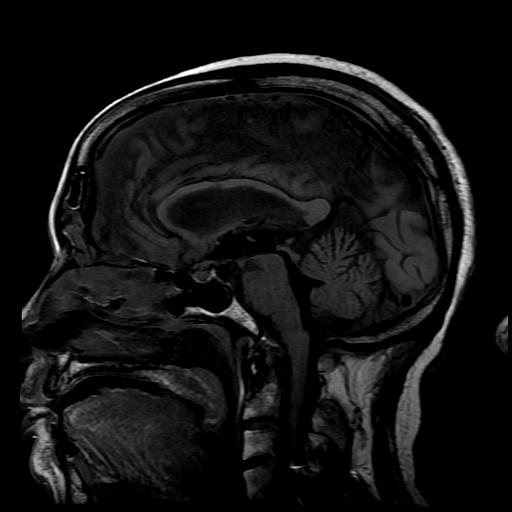
[im 25/25]
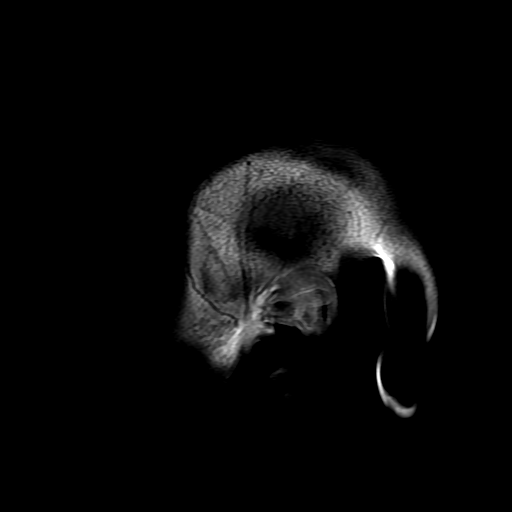

[Series 6: T2 · axial · 5.0mm · 0.43mm/px · z∈[-41,+103]mm · 3 of 25 slices shown (1 of 2)]
[im 1/25]
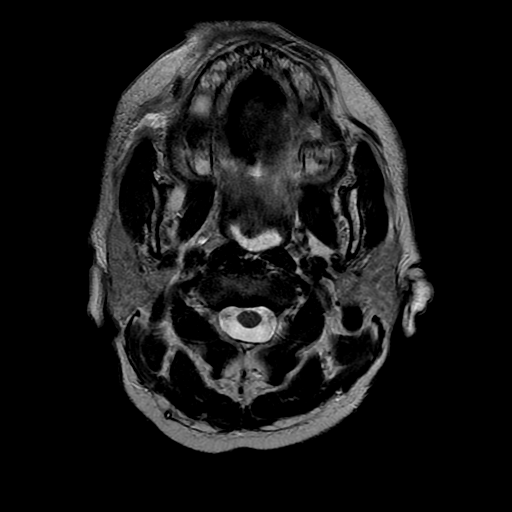
[im 13/25]
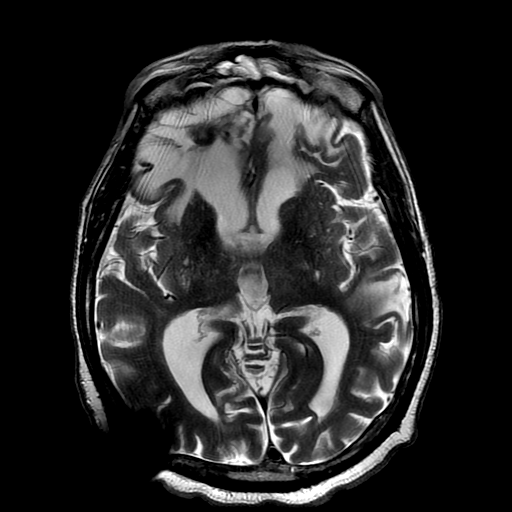
[im 25/25]
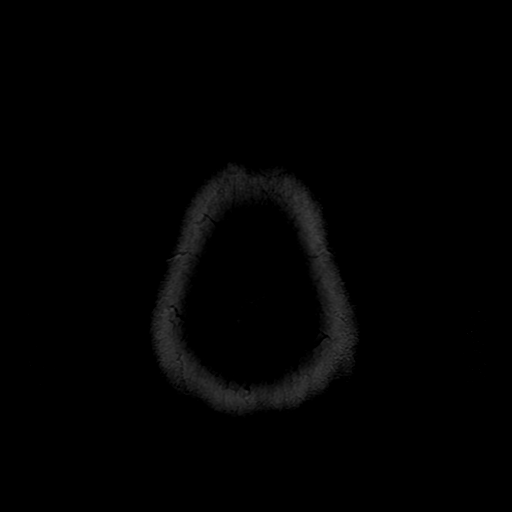

[Series 7: FLAIR · axial · 5.0mm · 0.43mm/px · z∈[-39,+101]mm · 2 of 21 slices shown]
[im 1/21]
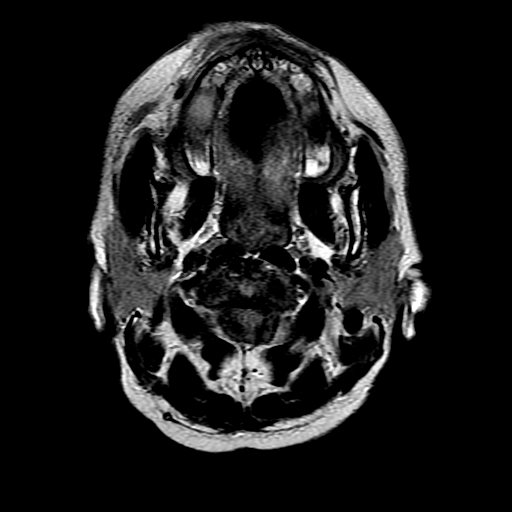
[im 21/21]
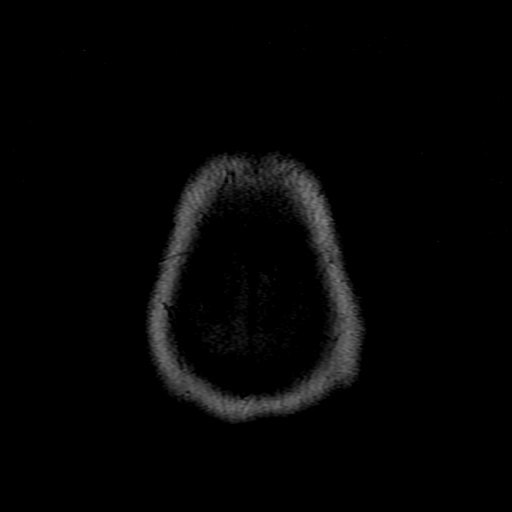

[Series 11: T2 · coronal · 5.0mm · 0.43mm/px · 3 of 27 slices shown (2 of 2)]
[im 1/27]
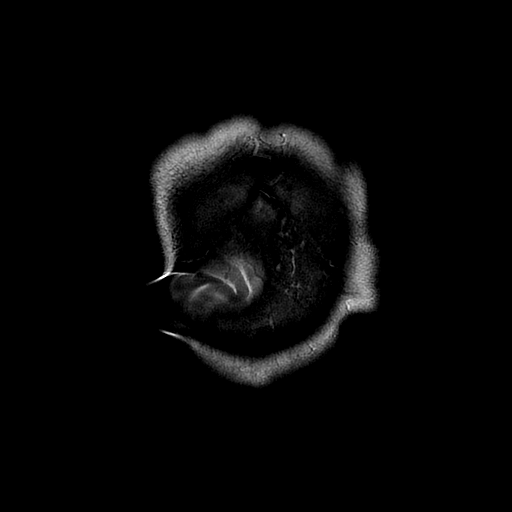
[im 14/27]
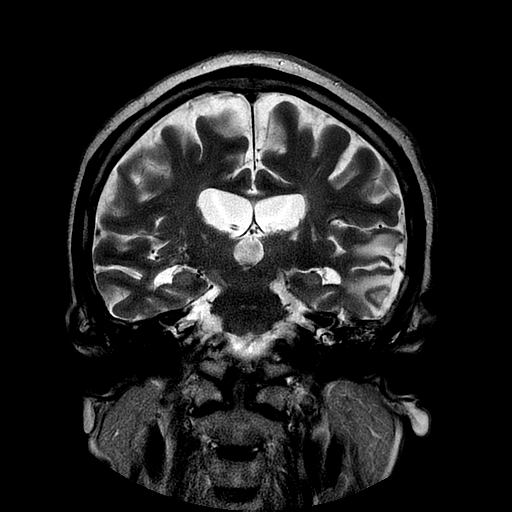
[im 27/27]
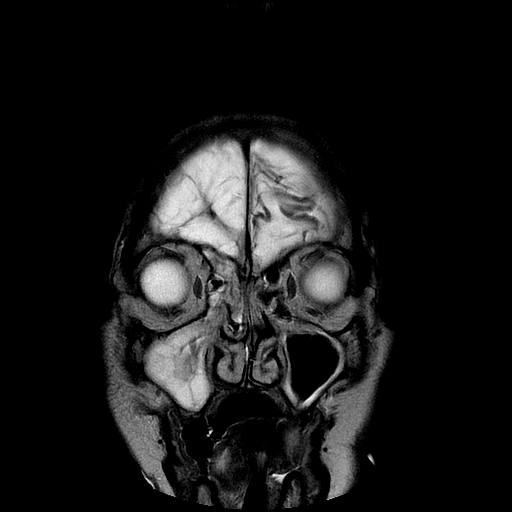

[Series 300: DWI · axial · 3.0mm · 1.09mm/px · z∈[-40,+104]mm · 5 of 49 slices shown (3 of 4)]
[im 1/49]
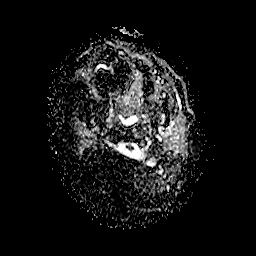
[im 13/49]
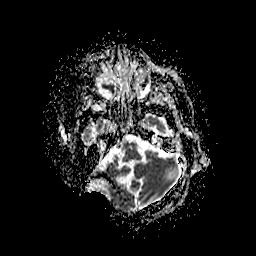
[im 25/49]
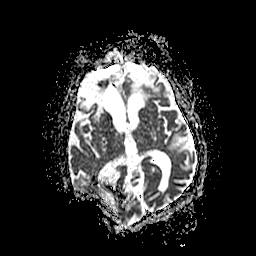
[im 37/49]
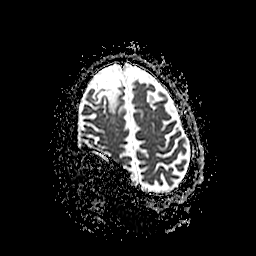
[im 49/49]
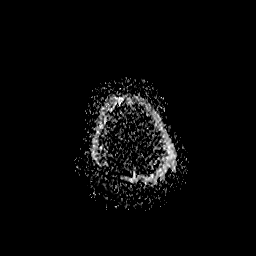

[Series 400: DWI · coronal · 5.0mm · 1.09mm/px · 3 of 32 slices shown (4 of 4)]
[im 1/32]
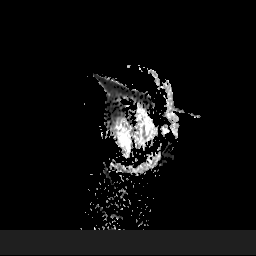
[im 16/32]
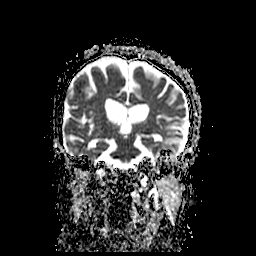
[im 32/32]
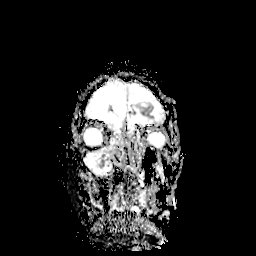

[34 of 48 positions shown; findings below may reference images not displayed]

FINDINGS: Significant degradation of portions of the image due to metallic
artifact from the patient's VP shunt.

Brain: 4 mm area of restricted diffusion, RIGHT paramedian pons, low
ADC, not present previously, consistent with acute infarction. This
cannot be correlated with coronal images due to signal loss related
to the metallic shunt, but is felt to be a real finding.

Chronic BILATERAL lacunar infarcts, most notable LEFT thalamus and
LEFT basal ganglia.

No acute hemorrhage, or intra-axial mass lesion. Hydrocephalus ex
vacuo. Shunt catheter remains well positioned within the ventricle
from a RIGHT parietal approach.

Extensive bifrontal and bitemporal encephalomalacia. Small fluid
collection in LEFT frontal region, incidental hygroma,
noncompressive, stable.

Extensive superficial siderosis over the frontal lobes, LEFT greater
than RIGHT, potentially epileptogenic.

Vascular: Flow voids are maintained.

Skull and upper cervical spine: No visible skull fracture or marrow
abnormality. Artifact related to prior posterior cervical fusion at
the C4 level.

Sinuses/Orbits: Chronic BILATERAL paranasal sinus disease, with only
slight layering fluid in the sphenoid and maxillary regions.

Other:  None.
IMPRESSION: 4 mm area of restricted diffusion RIGHT paramedian pons consistent
with acute nonhemorrhagic infarction. This was not present on the
previous study.

Brain substance loss as described, representing both posttraumatic
findings and chronic ischemic change. See discussion above.

## 2018-04-05 IMAGING — DX DG CHEST 1V PORT
1 series · 1 of 1 positions shown · non-contrast
Comparison: 03/18/2017.

CLINICAL DATA: Intubation.

EXAM:
PORTABLE CHEST 1 VIEW

[chest ap]
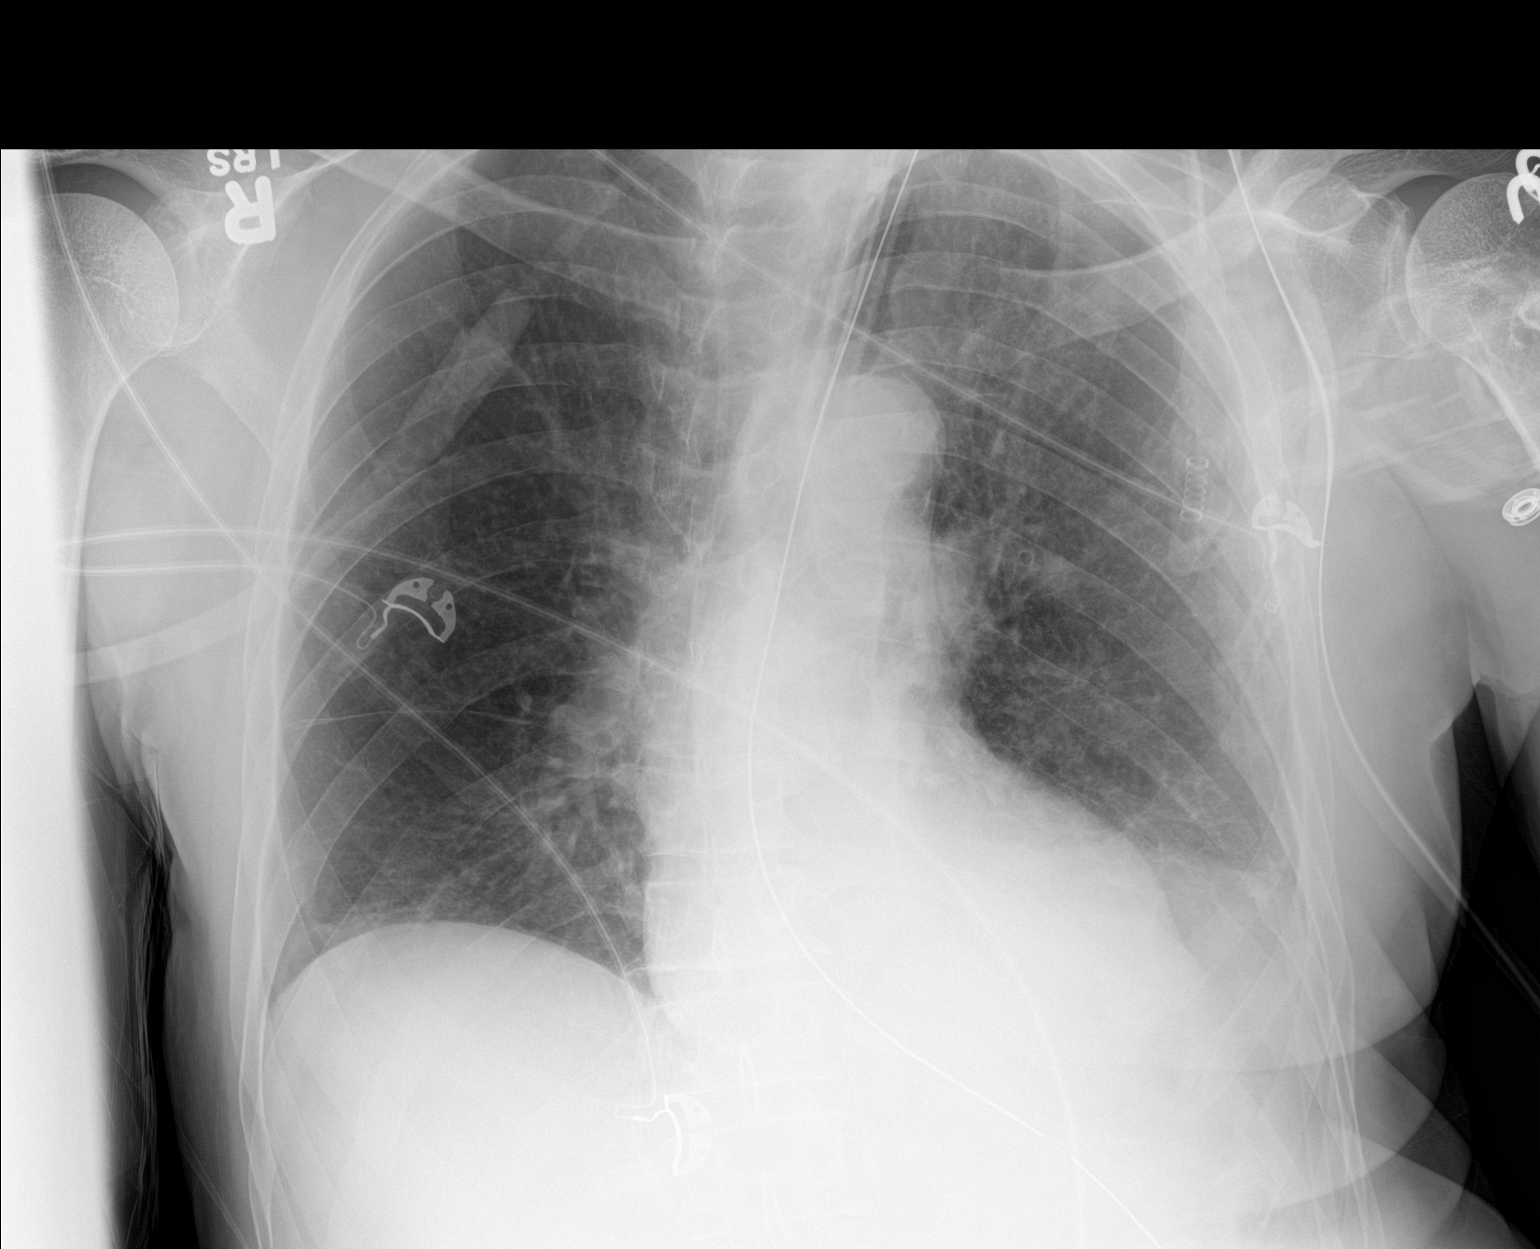

[1 of 1 positions shown; findings below may reference images not displayed]

FINDINGS: Endotracheal tube and NG tube in stable position. Heart size normal.
Mild left base atelectasis/infiltrate, slightly progressed from
prior exam. Small left pleural effusion. No pneumothorax .
IMPRESSION: 1. Lines and tubes in stable position.

2. Mild left base atelectasis/ infiltrate, slightly progressed from
prior exam. Small left pleural effusion unchanged.

## 2018-04-06 IMAGING — RF DG SWALLOWING FUNCTION - NRPT [facility]
1 series · 18 of 24 positions shown · non-contrast
Comparison: none

[Series 1: run · 17 acquisitions, 18 frames shown]
[im 1/17]
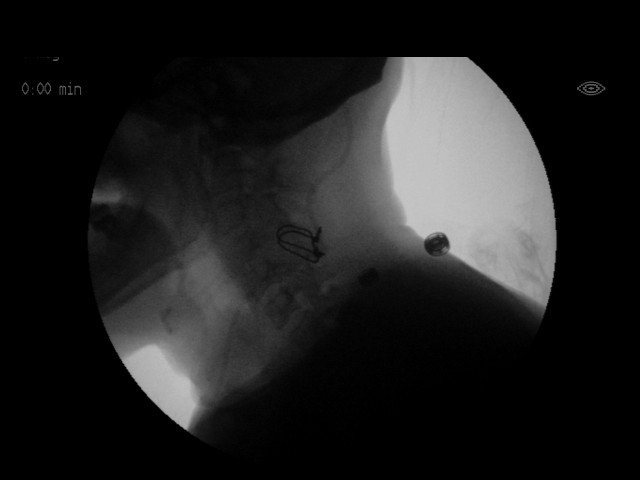
[im 2/17]
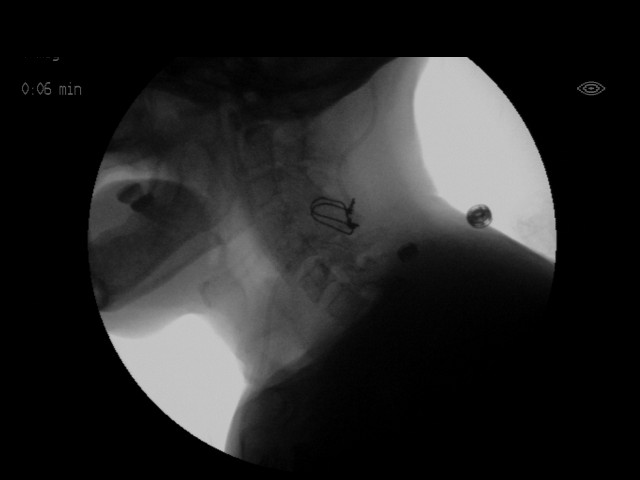
[im 3/17]
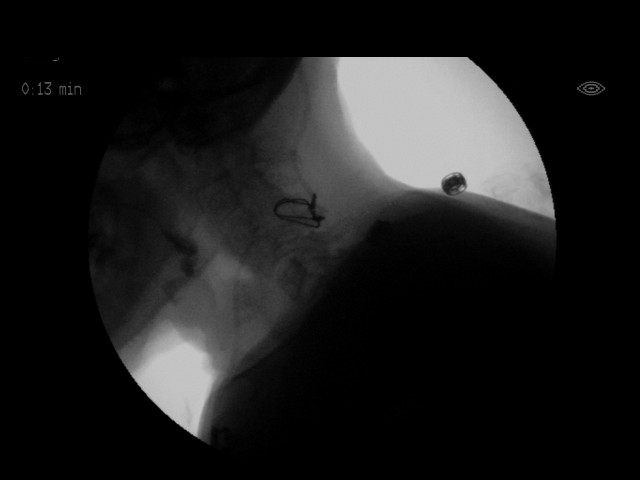
[im 4/17]
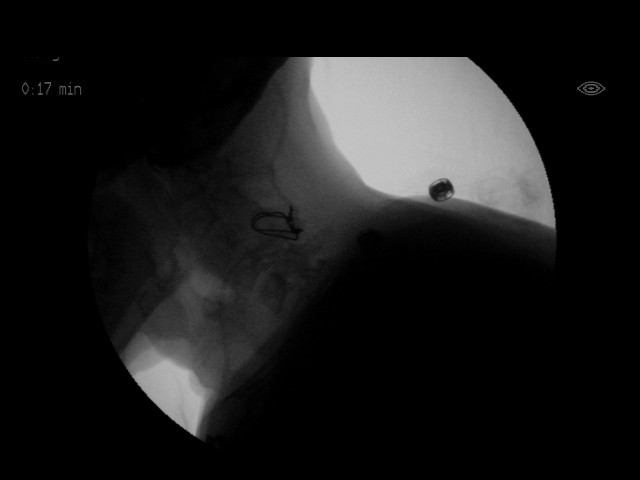
[im 5/17]
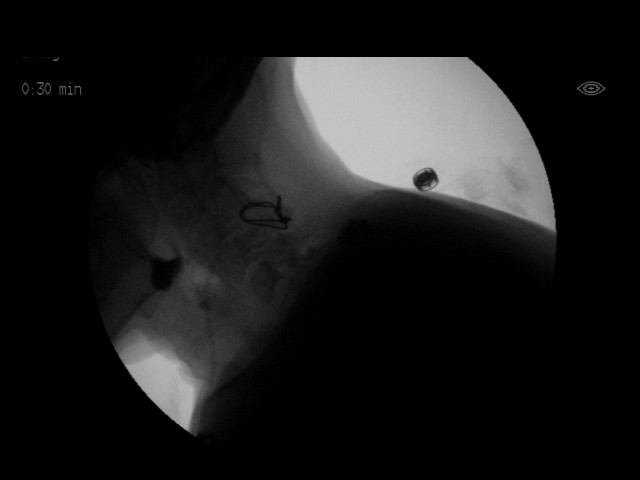
[im 6/17]
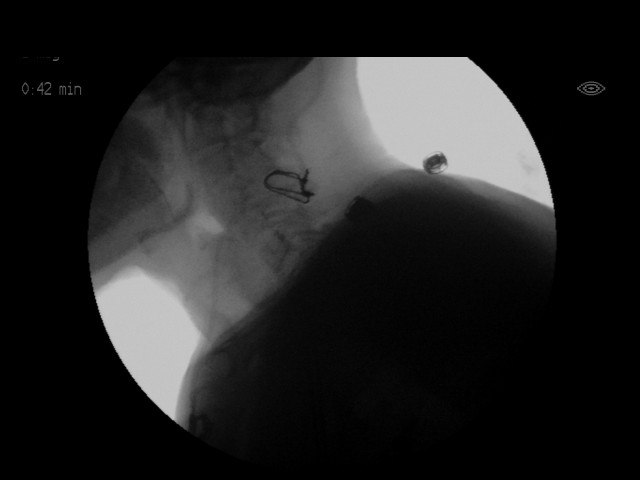
[im 6/17]
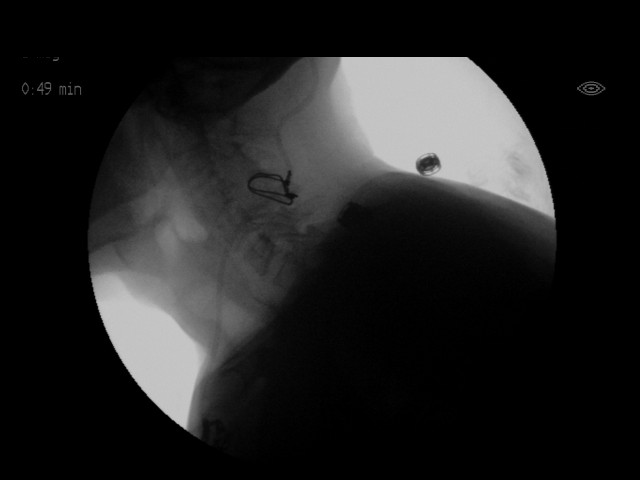
[im 8/17]
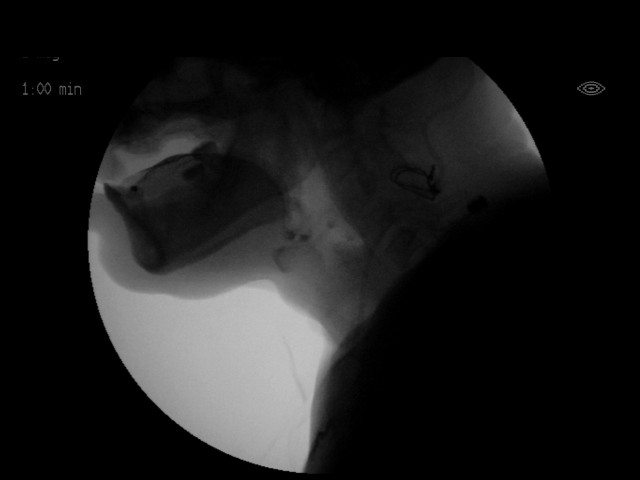
[im 9/17]
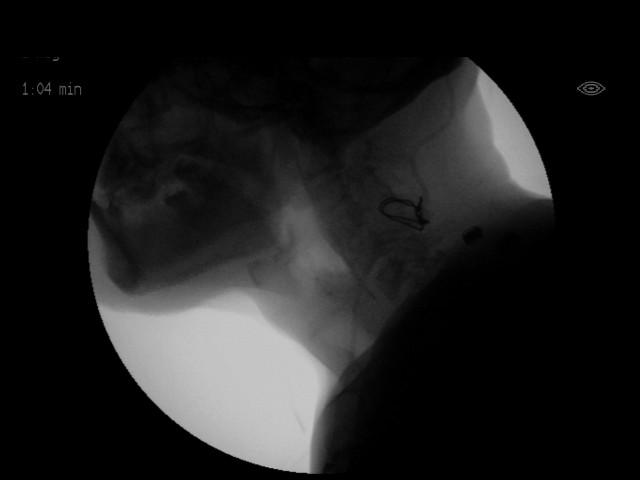
[im 9/17]
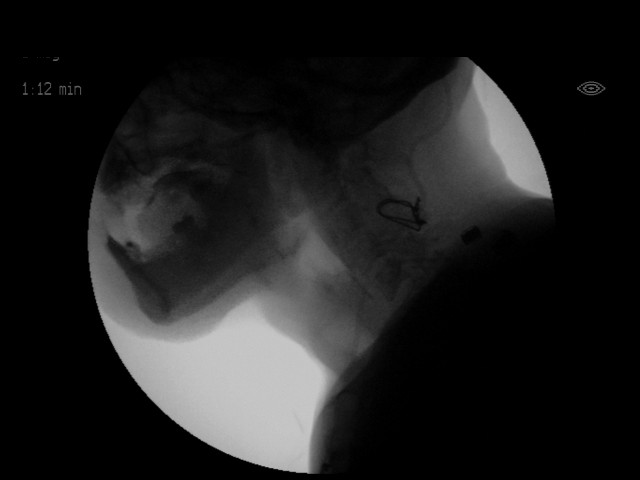
[im 11/17]
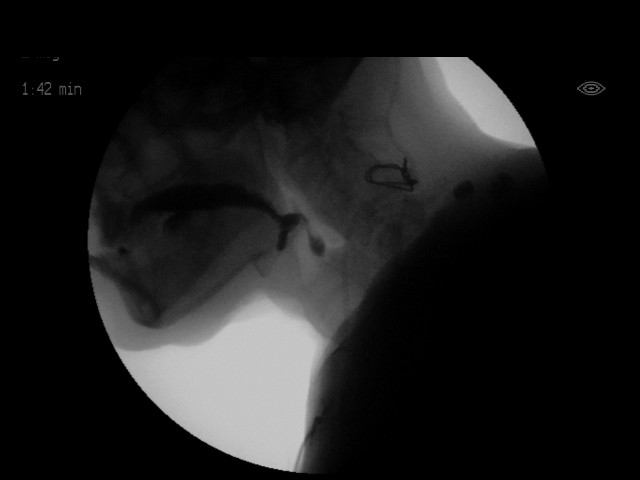
[im 12/17]
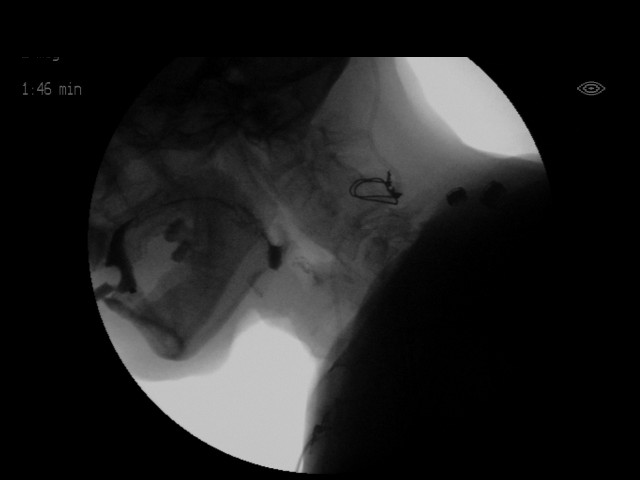
[im 12/17]
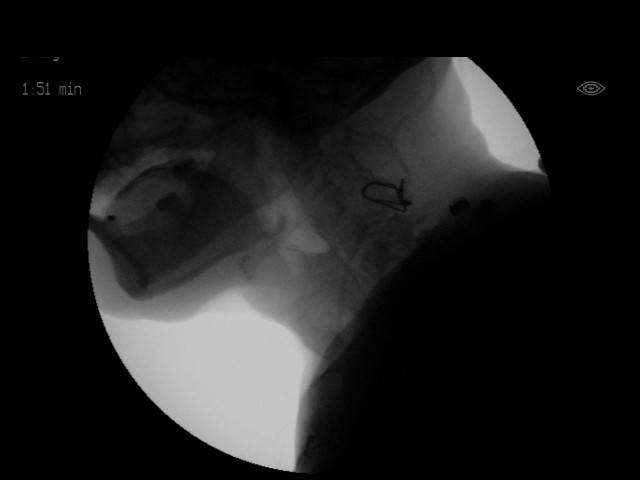
[im 14/17]
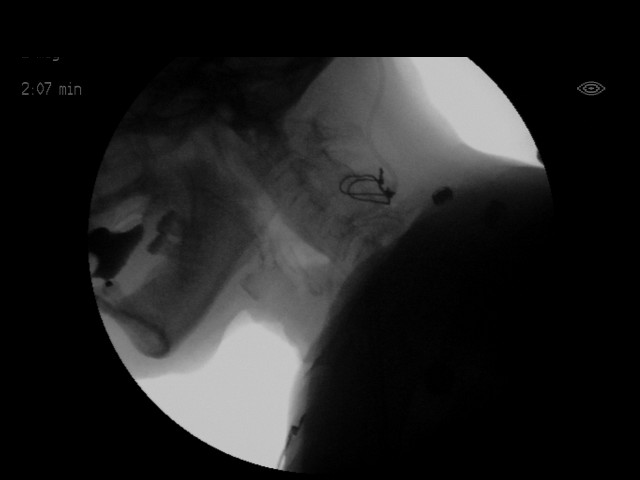
[im 14/17]
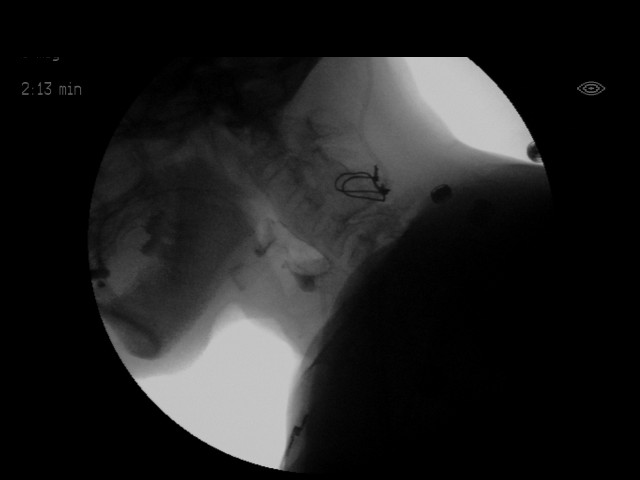
[im 15/17]
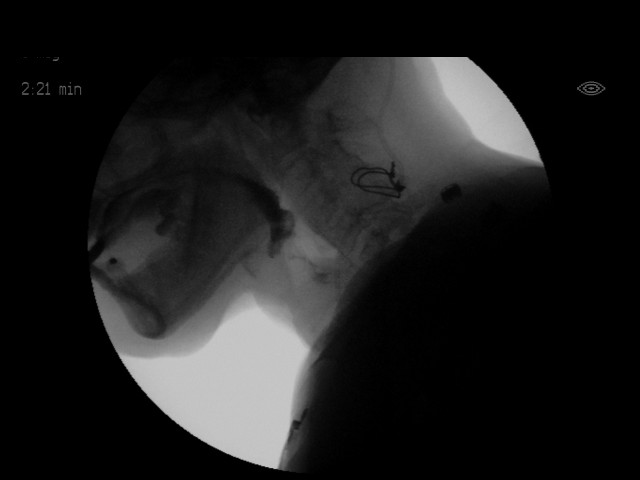
[im 17/17]
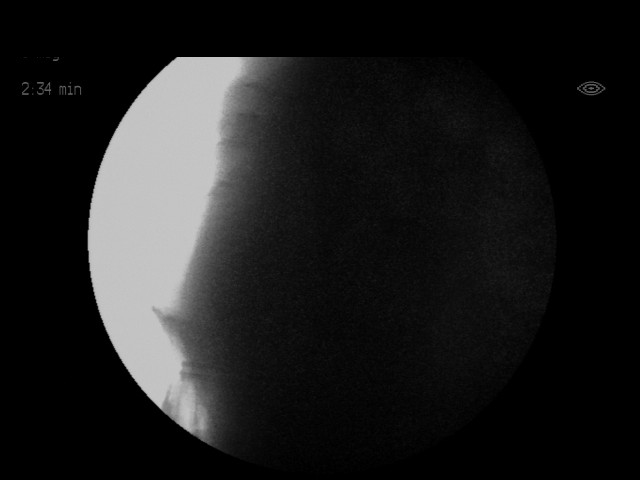
[im 17/17]
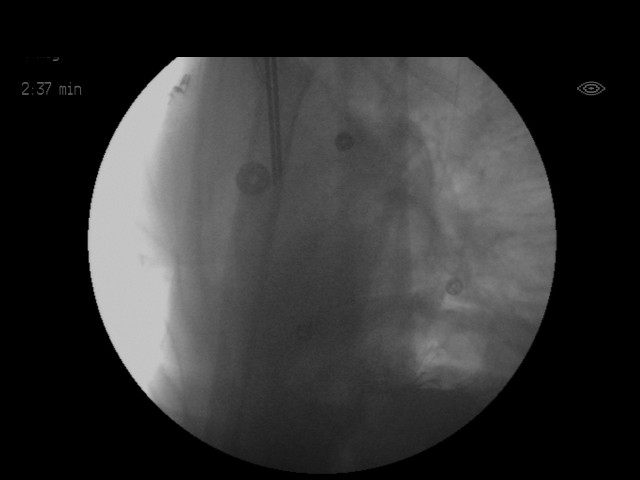

[18 of 24 positions shown; findings below may reference images not displayed]

FLUOROSCOPY FOR SWALLOWING FUNCTION STUDY:
Fluoroscopy was provided for swallowing function study, which was administered by a speech pathologist.  Final results and recommendations from this study are contained within the speech pathology report.

## 2018-06-03 ENCOUNTER — Emergency Department (HOSPITAL_COMMUNITY)
Admission: EM | Admit: 2018-06-03 | Discharge: 2018-06-03 | Disposition: A | Discharge status: Home / Self Care | Payer: Medicare Other | Attending: Emergency Medicine | Admitting: Emergency Medicine

## 2018-06-03 ENCOUNTER — Other Ambulatory Visit: Payer: Self-pay

## 2018-06-03 ENCOUNTER — Emergency Department (HOSPITAL_COMMUNITY): Payer: Medicare Other

## 2018-06-03 ENCOUNTER — Encounter (HOSPITAL_COMMUNITY): Payer: Self-pay | Admitting: *Deleted

## 2018-06-03 DIAGNOSIS — F10920 Alcohol use, unspecified with intoxication, uncomplicated: Secondary | ICD-10-CM

## 2018-06-03 DIAGNOSIS — F17210 Nicotine dependence, cigarettes, uncomplicated: Secondary | ICD-10-CM | POA: Insufficient documentation

## 2018-06-03 DIAGNOSIS — R4182 Altered mental status, unspecified: Secondary | ICD-10-CM | POA: Diagnosis present

## 2018-06-03 DIAGNOSIS — I10 Essential (primary) hypertension: Secondary | ICD-10-CM | POA: Diagnosis not present

## 2018-06-03 DIAGNOSIS — Z79899 Other long term (current) drug therapy: Secondary | ICD-10-CM | POA: Insufficient documentation

## 2018-06-03 LAB — URINALYSIS, ROUTINE W REFLEX MICROSCOPIC
Bilirubin Urine: NEGATIVE
GLUCOSE, UA: NEGATIVE mg/dL
KETONES UR: 5 mg/dL — AB
Leukocytes, UA: NEGATIVE
NITRITE: NEGATIVE
PH: 5 (ref 5.0–8.0)
Protein, ur: NEGATIVE mg/dL
SPECIFIC GRAVITY, URINE: 1.02 (ref 1.005–1.030)

## 2018-06-03 LAB — CBC WITH DIFFERENTIAL/PLATELET
Abs Immature Granulocytes: 0.02 10*3/uL (ref 0.00–0.07)
BASOS ABS: 0.1 10*3/uL (ref 0.0–0.1)
BASOS PCT: 1 %
EOS ABS: 0.3 10*3/uL (ref 0.0–0.5)
EOS PCT: 3 %
HEMATOCRIT: 45.4 % (ref 39.0–52.0)
Hemoglobin: 14.5 g/dL (ref 13.0–17.0)
IMMATURE GRANULOCYTES: 0 %
LYMPHS ABS: 3.7 10*3/uL (ref 0.7–4.0)
Lymphocytes Relative: 42 %
MCH: 29.5 pg (ref 26.0–34.0)
MCHC: 31.9 g/dL (ref 30.0–36.0)
MCV: 92.3 fL (ref 80.0–100.0)
Monocytes Absolute: 0.7 10*3/uL (ref 0.1–1.0)
Monocytes Relative: 8 %
NEUTROS PCT: 46 %
NRBC: 0 % (ref 0.0–0.2)
Neutro Abs: 4.1 10*3/uL (ref 1.7–7.7)
PLATELETS: 176 10*3/uL (ref 150–400)
RBC: 4.92 MIL/uL (ref 4.22–5.81)
RDW: 14.4 % (ref 11.5–15.5)
WBC: 8.9 10*3/uL (ref 4.0–10.5)

## 2018-06-03 LAB — RAPID URINE DRUG SCREEN, HOSP PERFORMED
Amphetamines: NOT DETECTED
Barbiturates: NOT DETECTED
Benzodiazepines: NOT DETECTED
Cocaine: NOT DETECTED
Opiates: NOT DETECTED
TETRAHYDROCANNABINOL: POSITIVE — AB

## 2018-06-03 LAB — I-STAT CHEM 8, ED
BUN: 21 mg/dL (ref 8–23)
CHLORIDE: 104 mmol/L (ref 98–111)
CREATININE: 1.2 mg/dL (ref 0.61–1.24)
Calcium, Ion: 1.13 mmol/L — ABNORMAL LOW (ref 1.15–1.40)
GLUCOSE: 78 mg/dL (ref 70–99)
HEMATOCRIT: 39 % (ref 39.0–52.0)
Hemoglobin: 13.3 g/dL (ref 13.0–17.0)
POTASSIUM: 3.7 mmol/L (ref 3.5–5.1)
Sodium: 140 mmol/L (ref 135–145)
TCO2: 22 mmol/L (ref 22–32)

## 2018-06-03 LAB — TROPONIN I

## 2018-06-03 LAB — ETHANOL: Alcohol, Ethyl (B): 88 mg/dL — ABNORMAL HIGH (ref <10)

## 2018-06-03 LAB — COMPREHENSIVE METABOLIC PANEL
ALK PHOS: 55 U/L (ref 38–126)
ALT: 16 U/L (ref 0–44)
AST: 42 U/L — AB (ref 15–41)
Albumin: 4.6 g/dL (ref 3.5–5.0)
Anion gap: 14 (ref 5–15)
BUN: 25 mg/dL — AB (ref 8–23)
CALCIUM: 9.3 mg/dL (ref 8.9–10.3)
CO2: 20 mmol/L — AB (ref 22–32)
CREATININE: 1.46 mg/dL — AB (ref 0.61–1.24)
Chloride: 105 mmol/L (ref 98–111)
GFR, EST AFRICAN AMERICAN: 56 mL/min — AB (ref >60)
GFR, EST NON AFRICAN AMERICAN: 48 mL/min — AB (ref >60)
Glucose, Bld: 86 mg/dL (ref 70–99)
Potassium: 6.1 mmol/L — ABNORMAL HIGH (ref 3.5–5.1)
Sodium: 139 mmol/L (ref 135–145)
Total Bilirubin: 1.8 mg/dL — ABNORMAL HIGH (ref 0.3–1.2)
Total Protein: 8.1 g/dL (ref 6.5–8.1)

## 2018-06-03 MED ORDER — LEVETIRACETAM IN NACL 1000 MG/100ML IV SOLN
1000.0000 mg | Freq: Once | INTRAVENOUS | Status: AC
  Administered 2018-06-03: 1000 mg via INTRAVENOUS
  Filled 2018-06-03: qty 100

## 2018-06-03 NOTE — Discharge Instructions (Addendum)
Take your medications.    Follow up with your doctor

## 2018-06-03 NOTE — ED Notes (Signed)
Bed: WA20 Expected date:  Expected time:  Means of arrival:  Comments: EMS/exposure

## 2018-06-03 NOTE — ED Triage Notes (Addendum)
EMS reports [place] was [place] a [place] where he slept outside all night. Large bottle of [place] Rose found on pt empty. Drank last night, denies drinking this am, denies pain or injury. [id number]
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
## 2018-06-03 NOTE — ED Notes (Signed)
Pt given ham sandwich and orange juice. 

## 2018-06-03 NOTE — Care Management Note (Signed)
Case Management Note  CM consulted for medication assistance.  Pt has both Medicare and Medicaid and does not qualify for other medication assistance programs at this time.  Updated Dr. Rubin Payor.  No further CM needs noted at this time.  Rasmus Preusser, Lynnae Sandhoff, RN 06/03/2018, 1:48 PM

## 2018-06-04 ENCOUNTER — Encounter (HOSPITAL_COMMUNITY): Payer: Self-pay | Admitting: Emergency Medicine

## 2018-06-04 ENCOUNTER — Emergency Department (HOSPITAL_COMMUNITY)
Admission: EM | Admit: 2018-06-04 | Discharge: 2018-06-05 | Disposition: A | Discharge status: Home / Self Care | Payer: Medicare Other | Attending: Emergency Medicine | Admitting: Emergency Medicine

## 2018-06-04 DIAGNOSIS — I4891 Unspecified atrial fibrillation: Secondary | ICD-10-CM | POA: Diagnosis not present

## 2018-06-04 DIAGNOSIS — I10 Essential (primary) hypertension: Secondary | ICD-10-CM | POA: Insufficient documentation

## 2018-06-04 DIAGNOSIS — F17210 Nicotine dependence, cigarettes, uncomplicated: Secondary | ICD-10-CM | POA: Insufficient documentation

## 2018-06-04 DIAGNOSIS — R2681 Unsteadiness on feet: Secondary | ICD-10-CM | POA: Insufficient documentation

## 2018-06-04 DIAGNOSIS — Z79899 Other long term (current) drug therapy: Secondary | ICD-10-CM | POA: Insufficient documentation

## 2018-06-04 DIAGNOSIS — R5383 Other fatigue: Secondary | ICD-10-CM | POA: Diagnosis present

## 2018-06-04 LAB — CBC WITH DIFFERENTIAL/PLATELET
ABS IMMATURE GRANULOCYTES: 0.02 10*3/uL (ref 0.00–0.07)
Basophils Absolute: 0 10*3/uL (ref 0.0–0.1)
Basophils Relative: 1 %
EOS PCT: 4 %
Eosinophils Absolute: 0.3 10*3/uL (ref 0.0–0.5)
HEMATOCRIT: 39.4 % (ref 39.0–52.0)
HEMOGLOBIN: 12.5 g/dL — AB (ref 13.0–17.0)
Immature Granulocytes: 0 %
LYMPHS ABS: 2.3 10*3/uL (ref 0.7–4.0)
Lymphocytes Relative: 32 %
MCH: 29.8 pg (ref 26.0–34.0)
MCHC: 31.7 g/dL (ref 30.0–36.0)
MCV: 94 fL (ref 80.0–100.0)
MONO ABS: 0.7 10*3/uL (ref 0.1–1.0)
MONOS PCT: 10 %
Neutro Abs: 3.8 10*3/uL (ref 1.7–7.7)
Neutrophils Relative %: 53 %
Platelets: 150 10*3/uL (ref 150–400)
RBC: 4.19 MIL/uL — ABNORMAL LOW (ref 4.22–5.81)
RDW: 14.4 % (ref 11.5–15.5)
WBC: 7.1 10*3/uL (ref 4.0–10.5)
nRBC: 0 % (ref 0.0–0.2)

## 2018-06-04 LAB — COMPREHENSIVE METABOLIC PANEL
ALK PHOS: 51 U/L (ref 38–126)
ALT: 21 U/L (ref 0–44)
AST: 46 U/L — AB (ref 15–41)
Albumin: 4 g/dL (ref 3.5–5.0)
Anion gap: 9 (ref 5–15)
BUN: 21 mg/dL (ref 8–23)
CALCIUM: 8.9 mg/dL (ref 8.9–10.3)
CO2: 25 mmol/L (ref 22–32)
CREATININE: 0.96 mg/dL (ref 0.61–1.24)
Chloride: 107 mmol/L (ref 98–111)
Glucose, Bld: 104 mg/dL — ABNORMAL HIGH (ref 70–99)
Potassium: 3.9 mmol/L (ref 3.5–5.1)
SODIUM: 141 mmol/L (ref 135–145)
Total Bilirubin: 1.1 mg/dL (ref 0.3–1.2)
Total Protein: 7.1 g/dL (ref 6.5–8.1)

## 2018-06-04 LAB — ETHANOL: Alcohol, Ethyl (B): <10 mg/dL (ref <10)

## 2018-06-04 LAB — SALICYLATE LEVEL

## 2018-06-04 LAB — ACETAMINOPHEN LEVEL: Acetaminophen (Tylenol), Serum: <10 ug/mL — ABNORMAL LOW (ref 10–30)

## 2018-06-04 NOTE — ED Notes (Signed)
Bed: WA27 Expected date:  Expected time:  Means of arrival:  Comments: 

## 2018-06-04 NOTE — ED Notes (Signed)
4 white bags 1 stick by trash can
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
## 2018-06-04 NOTE — ED Triage Notes (Signed)
Per EMS with was laying in the middle of road. Reports that he was discharged this morning. States that he has been walking around all day and "just got tired". ETOH.

## 2018-06-04 NOTE — Discharge Instructions (Addendum)
Followup with your doctor as needed

## 2018-06-05 ENCOUNTER — Emergency Department (HOSPITAL_COMMUNITY): Payer: Medicare Other

## 2018-06-05 DIAGNOSIS — R2681 Unsteadiness on feet: Secondary | ICD-10-CM | POA: Diagnosis not present

## 2018-06-05 NOTE — ED Notes (Signed)
Pt asked RN to call his daughter, RN called pt's daughter and informed her that he was at [facility] in the ER and would need a way home. Pt's daughter reported that she does not have a car and will call pt's cousin to see if they can come get him.  RN requested that the daughter call her back with information. Pt's daughter agreed to do so.

## 2018-06-05 NOTE — ED Notes (Signed)
Patient transported to CT 

## 2018-06-05 NOTE — ED Provider Notes (Signed)
Here yesterday for AMS, intox Seen eval'd, discharged Tonight, he was walking and sat down in the middle of the street EMS called by bystander Attempted to ambulate the patient here and he is significantly off balance - new Pending Head CT  Plan:  Will need recheck Ambulate the patient Anticipate discharge home.   Head CT negative for acute findings. The patient has been pleasant cooperative throughout  ED encounter. He ambulated to and from CT scanner without difficulty. He walks with a cane which he has in the ED. He does not feel his walk or balance is different from his normal walk and balance.   Will let the patient sleep until morning and get breakfast prior to discharge home.   Final recheck - the patient continues to have no complaints. He ambulates with me and is feeble but still states he is at his baseline using a cane to stabilize.  He will be discharged when his daughter is contacted to pick him up.    Elpidio Anis, PA-C 06/05/18 1610    Donnetta Hutching, MD 06/05/18 1345    Donnetta Hutching, MD 06/05/18 [phone]

## 2018-06-05 NOTE — ED Notes (Signed)
RN spoke to pt's daughter and she reports she is on her way. Pt's daughter should be here within the hour.

## 2018-06-17 ENCOUNTER — Encounter (HOSPITAL_COMMUNITY): Payer: Self-pay | Admitting: Emergency Medicine

## 2018-06-17 ENCOUNTER — Emergency Department (HOSPITAL_COMMUNITY): Payer: Medicare Other

## 2018-06-17 ENCOUNTER — Emergency Department (HOSPITAL_COMMUNITY)
Admission: EM | Admit: 2018-06-17 | Discharge: 2018-06-17 | Disposition: A | Discharge status: Home / Self Care | Payer: Medicare Other | Attending: Emergency Medicine | Admitting: Emergency Medicine

## 2018-06-17 ENCOUNTER — Other Ambulatory Visit: Payer: Self-pay

## 2018-06-17 DIAGNOSIS — I10 Essential (primary) hypertension: Secondary | ICD-10-CM | POA: Diagnosis not present

## 2018-06-17 DIAGNOSIS — F17210 Nicotine dependence, cigarettes, uncomplicated: Secondary | ICD-10-CM | POA: Insufficient documentation

## 2018-06-17 DIAGNOSIS — N179 Acute kidney failure, unspecified: Secondary | ICD-10-CM | POA: Insufficient documentation

## 2018-06-17 DIAGNOSIS — F10920 Alcohol use, unspecified with intoxication, uncomplicated: Secondary | ICD-10-CM | POA: Diagnosis not present

## 2018-06-17 DIAGNOSIS — F10120 Alcohol abuse with intoxication, uncomplicated: Secondary | ICD-10-CM | POA: Diagnosis present

## 2018-06-17 DIAGNOSIS — Z8673 Personal history of transient ischemic attack (TIA), and cerebral infarction without residual deficits: Secondary | ICD-10-CM | POA: Insufficient documentation

## 2018-06-17 LAB — CBC WITH DIFFERENTIAL/PLATELET
Abs Immature Granulocytes: 0.11 10*3/uL — ABNORMAL HIGH (ref 0.00–0.07)
BASOS PCT: 0 %
Basophils Absolute: 0 10*3/uL (ref 0.0–0.1)
EOS ABS: 0.1 10*3/uL (ref 0.0–0.5)
Eosinophils Relative: 1 %
HCT: 48.3 % (ref 39.0–52.0)
Hemoglobin: 14 g/dL (ref 13.0–17.0)
Immature Granulocytes: 1 %
Lymphocytes Relative: 28 %
Lymphs Abs: 3.9 10*3/uL (ref 0.7–4.0)
MCH: 28.8 pg (ref 26.0–34.0)
MCHC: 29 g/dL — ABNORMAL LOW (ref 30.0–36.0)
MCV: 99.4 fL (ref 80.0–100.0)
MONO ABS: 1 10*3/uL (ref 0.1–1.0)
MONOS PCT: 7 %
Neutro Abs: 8.8 10*3/uL — ABNORMAL HIGH (ref 1.7–7.7)
Neutrophils Relative %: 63 %
PLATELETS: 220 10*3/uL (ref 150–400)
RBC: 4.86 MIL/uL (ref 4.22–5.81)
RDW: 15 % (ref 11.5–15.5)
WBC: 13.9 10*3/uL — AB (ref 4.0–10.5)
nRBC: 0 % (ref 0.0–0.2)

## 2018-06-17 LAB — COMPREHENSIVE METABOLIC PANEL
ALBUMIN: 3.8 g/dL (ref 3.5–5.0)
ALK PHOS: 54 U/L (ref 38–126)
ALT: 22 U/L (ref 0–44)
AST: 31 U/L (ref 15–41)
Anion gap: 21 — ABNORMAL HIGH (ref 5–15)
BILIRUBIN TOTAL: 0.7 mg/dL (ref 0.3–1.2)
BUN: 29 mg/dL — AB (ref 8–23)
CALCIUM: 8.7 mg/dL — AB (ref 8.9–10.3)
CO2: 11 mmol/L — ABNORMAL LOW (ref 22–32)
Chloride: 107 mmol/L (ref 98–111)
Creatinine, Ser: 1.78 mg/dL — ABNORMAL HIGH (ref 0.61–1.24)
GFR calc Af Amer: 44 mL/min — ABNORMAL LOW (ref >60)
GFR, EST NON AFRICAN AMERICAN: 38 mL/min — AB (ref >60)
GLUCOSE: 64 mg/dL — AB (ref 70–99)
Potassium: 4 mmol/L (ref 3.5–5.1)
Sodium: 139 mmol/L (ref 135–145)
TOTAL PROTEIN: 7.5 g/dL (ref 6.5–8.1)

## 2018-06-17 LAB — CBG MONITORING, ED: Glucose-Capillary: 138 mg/dL — ABNORMAL HIGH (ref 70–99)

## 2018-06-17 LAB — ETHANOL: Alcohol, Ethyl (B): 200 mg/dL — ABNORMAL HIGH (ref <10)

## 2018-06-17 MED ORDER — DEXTROSE 50 % IV SOLN
1.0000 | Freq: Once | INTRAVENOUS | Status: AC
  Administered 2018-06-17: 50 mL via INTRAVENOUS
  Filled 2018-06-17: qty 50

## 2018-06-17 MED ORDER — SODIUM CHLORIDE 0.9 % IV BOLUS
1000.0000 mL | Freq: Once | INTRAVENOUS | Status: AC
  Administered 2018-06-17: 1000 mL via INTRAVENOUS

## 2018-06-17 NOTE — Discharge Instructions (Signed)
Please make an appointment with your doctor to have your kidney's re-checked

## 2018-06-17 NOTE — Progress Notes (Signed)
CSW spoke with pt after pt discharged. CSW provided taxi voucher for pt to return home. Pt informed CSW that he lives at home with his girlfriend. Pt says he does not have any concerns about being home. CSW provided pt with substance abuse resources including [place]. Pt agreeable trying AA meetings.   Montine CircleKelsy Charlayne Vultaggio, Silverio LayLCSWA  Emergency Room  [phone]

## 2018-06-17 NOTE — ED Notes (Signed)
Pt ambulated to the bathroom and dressed self in paper scrubs d/t urine incontinence, pt MAE, denies pain, pt at 50% of lunch
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
## 2018-12-15 ENCOUNTER — Inpatient Hospital Stay (HOSPITAL_COMMUNITY)
Admission: EM | Admit: 2018-12-15 | Discharge: 2018-12-20 | DRG: 065 | Disposition: A | Discharge status: Home Health | Payer: Medicare Other | Attending: Internal Medicine | Admitting: Internal Medicine

## 2018-12-15 ENCOUNTER — Emergency Department (HOSPITAL_COMMUNITY): Payer: Medicare Other

## 2018-12-15 ENCOUNTER — Encounter (HOSPITAL_COMMUNITY): Payer: Self-pay | Admitting: Emergency Medicine

## 2018-12-15 ENCOUNTER — Other Ambulatory Visit: Payer: Self-pay

## 2018-12-15 DIAGNOSIS — I63411 Cerebral infarction due to embolism of right middle cerebral artery: Secondary | ICD-10-CM

## 2018-12-15 DIAGNOSIS — Z590 Homelessness: Secondary | ICD-10-CM

## 2018-12-15 DIAGNOSIS — Y92009 Unspecified place in unspecified non-institutional (private) residence as the place of occurrence of the external cause: Secondary | ICD-10-CM

## 2018-12-15 DIAGNOSIS — G40909 Epilepsy, unspecified, not intractable, without status epilepticus: Secondary | ICD-10-CM | POA: Diagnosis present

## 2018-12-15 DIAGNOSIS — R531 Weakness: Secondary | ICD-10-CM | POA: Diagnosis not present

## 2018-12-15 DIAGNOSIS — R29708 NIHSS score 8: Secondary | ICD-10-CM | POA: Diagnosis present

## 2018-12-15 DIAGNOSIS — I498 Other specified cardiac arrhythmias: Secondary | ICD-10-CM

## 2018-12-15 DIAGNOSIS — Z91128 Patient's intentional underdosing of medication regimen for other reason: Secondary | ICD-10-CM

## 2018-12-15 DIAGNOSIS — I4891 Unspecified atrial fibrillation: Secondary | ICD-10-CM | POA: Diagnosis present

## 2018-12-15 DIAGNOSIS — Z981 Arthrodesis status: Secondary | ICD-10-CM

## 2018-12-15 DIAGNOSIS — T45516A Underdosing of anticoagulants, initial encounter: Secondary | ICD-10-CM | POA: Diagnosis present

## 2018-12-15 DIAGNOSIS — I63311 Cerebral infarction due to thrombosis of right middle cerebral artery: Principal | ICD-10-CM | POA: Diagnosis present

## 2018-12-15 DIAGNOSIS — Z982 Presence of cerebrospinal fluid drainage device: Secondary | ICD-10-CM

## 2018-12-15 DIAGNOSIS — I10 Essential (primary) hypertension: Secondary | ICD-10-CM | POA: Diagnosis present

## 2018-12-15 DIAGNOSIS — Z1159 Encounter for screening for other viral diseases: Secondary | ICD-10-CM

## 2018-12-15 DIAGNOSIS — Z7901 Long term (current) use of anticoagulants: Secondary | ICD-10-CM

## 2018-12-15 DIAGNOSIS — R0989 Other specified symptoms and signs involving the circulatory and respiratory systems: Secondary | ICD-10-CM

## 2018-12-15 DIAGNOSIS — R29810 Facial weakness: Secondary | ICD-10-CM | POA: Diagnosis present

## 2018-12-15 DIAGNOSIS — I160 Hypertensive urgency: Secondary | ICD-10-CM | POA: Diagnosis present

## 2018-12-15 DIAGNOSIS — Z9114 Patient's other noncompliance with medication regimen: Secondary | ICD-10-CM

## 2018-12-15 DIAGNOSIS — F1010 Alcohol abuse, uncomplicated: Secondary | ICD-10-CM | POA: Diagnosis present

## 2018-12-15 DIAGNOSIS — E785 Hyperlipidemia, unspecified: Secondary | ICD-10-CM | POA: Diagnosis present

## 2018-12-15 DIAGNOSIS — Z86718 Personal history of other venous thrombosis and embolism: Secondary | ICD-10-CM

## 2018-12-15 DIAGNOSIS — I6340 Cerebral infarction due to embolism of unspecified cerebral artery: Secondary | ICD-10-CM

## 2018-12-15 DIAGNOSIS — F17210 Nicotine dependence, cigarettes, uncomplicated: Secondary | ICD-10-CM | POA: Diagnosis present

## 2018-12-15 DIAGNOSIS — Z8673 Personal history of transient ischemic attack (TIA), and cerebral infarction without residual deficits: Secondary | ICD-10-CM

## 2018-12-15 DIAGNOSIS — F1290 Cannabis use, unspecified, uncomplicated: Secondary | ICD-10-CM | POA: Diagnosis present

## 2018-12-15 DIAGNOSIS — Z87891 Personal history of nicotine dependence: Secondary | ICD-10-CM

## 2018-12-15 DIAGNOSIS — Z823 Family history of stroke: Secondary | ICD-10-CM

## 2018-12-15 DIAGNOSIS — R1310 Dysphagia, unspecified: Secondary | ICD-10-CM | POA: Diagnosis present

## 2018-12-15 DIAGNOSIS — R471 Dysarthria and anarthria: Secondary | ICD-10-CM | POA: Diagnosis present

## 2018-12-15 DIAGNOSIS — G8194 Hemiplegia, unspecified affecting left nondominant side: Secondary | ICD-10-CM | POA: Diagnosis present

## 2018-12-15 DIAGNOSIS — Z9181 History of falling: Secondary | ICD-10-CM

## 2018-12-15 LAB — DIFFERENTIAL
Abs Immature Granulocytes: 0.03 10*3/uL (ref 0.00–0.07)
Basophils Absolute: 0.1 10*3/uL (ref 0.0–0.1)
Basophils Relative: 1 %
Eosinophils Absolute: 0.2 10*3/uL (ref 0.0–0.5)
Eosinophils Relative: 2 %
Immature Granulocytes: 0 %
Lymphocytes Relative: 38 %
Lymphs Abs: 2.8 10*3/uL (ref 0.7–4.0)
Monocytes Absolute: 0.7 10*3/uL (ref 0.1–1.0)
Monocytes Relative: 9 %
Neutro Abs: 3.6 10*3/uL (ref 1.7–7.7)
Neutrophils Relative %: 50 %

## 2018-12-15 LAB — COMPREHENSIVE METABOLIC PANEL
ALT: 19 U/L (ref 0–44)
AST: 18 U/L (ref 15–41)
Albumin: 3.3 g/dL — ABNORMAL LOW (ref 3.5–5.0)
Alkaline Phosphatase: 42 U/L (ref 38–126)
Anion gap: 12 (ref 5–15)
BUN: 11 mg/dL (ref 8–23)
CO2: 20 mmol/L — ABNORMAL LOW (ref 22–32)
Calcium: 8.6 mg/dL — ABNORMAL LOW (ref 8.9–10.3)
Chloride: 105 mmol/L (ref 98–111)
Creatinine, Ser: 1.04 mg/dL (ref 0.61–1.24)
GFR calc Af Amer: >60 mL/min (ref >60)
GFR calc non Af Amer: >60 mL/min (ref >60)
Glucose, Bld: 95 mg/dL (ref 70–99)
Potassium: 3.9 mmol/L (ref 3.5–5.1)
Sodium: 137 mmol/L (ref 135–145)
Total Bilirubin: 1.2 mg/dL (ref 0.3–1.2)
Total Protein: 6.4 g/dL — ABNORMAL LOW (ref 6.5–8.1)

## 2018-12-15 LAB — I-STAT CHEM 8, ED
BUN: 17 mg/dL (ref 8–23)
Calcium, Ion: 0.96 mmol/L — ABNORMAL LOW (ref 1.15–1.40)
Chloride: 111 mmol/L (ref 98–111)
Creatinine, Ser: 0.7 mg/dL (ref 0.61–1.24)
Glucose, Bld: 96 mg/dL (ref 70–99)
HCT: 50 % (ref 39.0–52.0)
Hemoglobin: 17 g/dL (ref 13.0–17.0)
Potassium: 4.9 mmol/L (ref 3.5–5.1)
Sodium: 137 mmol/L (ref 135–145)
TCO2: 21 mmol/L — ABNORMAL LOW (ref 22–32)

## 2018-12-15 LAB — CBC
HCT: 44.4 % (ref 39.0–52.0)
Hemoglobin: 14.3 g/dL (ref 13.0–17.0)
MCH: 30.1 pg (ref 26.0–34.0)
MCHC: 32.2 g/dL (ref 30.0–36.0)
MCV: 93.5 fL (ref 80.0–100.0)
Platelets: 162 10*3/uL (ref 150–400)
RBC: 4.75 MIL/uL (ref 4.22–5.81)
RDW: 14.6 % (ref 11.5–15.5)
WBC: 7.3 10*3/uL (ref 4.0–10.5)
nRBC: 0 % (ref 0.0–0.2)

## 2018-12-15 LAB — APTT: aPTT: 28 seconds (ref 24–36)

## 2018-12-15 LAB — SARS CORONAVIRUS 2 BY RT PCR (HOSPITAL ORDER, PERFORMED IN ~~LOC~~ HOSPITAL LAB): SARS Coronavirus 2: NEGATIVE

## 2018-12-15 LAB — PROTIME-INR
INR: 1.1 (ref 0.8–1.2)
Prothrombin Time: 14.4 seconds (ref 11.4–15.2)

## 2018-12-15 MED ORDER — NICOTINE 21 MG/24HR TD PT24
21.0000 mg | MEDICATED_PATCH | Freq: Every day | TRANSDERMAL | Status: DC
  Administered 2018-12-16 – 2018-12-20 (×5): 21 mg via TRANSDERMAL
  Filled 2018-12-15 (×5): qty 1

## 2018-12-15 MED ORDER — ACETAMINOPHEN 160 MG/5ML PO SOLN: 650.0000 mg | ORAL | Status: DC | PRN

## 2018-12-15 MED ORDER — ADULT MULTIVITAMIN W/MINERALS CH
1.0000 | ORAL_TABLET | Freq: Every day | ORAL | Status: DC
  Administered 2018-12-15 – 2018-12-18 (×3): 1 via ORAL
  Filled 2018-12-15 (×5): qty 1

## 2018-12-15 MED ORDER — ACETAMINOPHEN 325 MG PO TABS: 650.0000 mg | ORAL_TABLET | ORAL | Status: DC | PRN

## 2018-12-15 MED ORDER — FOLIC ACID 1 MG PO TABS
1.0000 mg | ORAL_TABLET | Freq: Every day | ORAL | Status: DC
  Administered 2018-12-15 – 2018-12-20 (×4): 1 mg via ORAL
  Filled 2018-12-15 (×5): qty 1

## 2018-12-15 MED ORDER — THIAMINE HCL 100 MG/ML IJ SOLN
100.0000 mg | Freq: Every day | INTRAMUSCULAR | Status: DC
  Administered 2018-12-15 – 2018-12-16 (×2): 100 mg via INTRAVENOUS
  Filled 2018-12-15 (×2): qty 2

## 2018-12-15 MED ORDER — STROKE: EARLY STAGES OF RECOVERY BOOK
Freq: Once | Status: AC
  Administered 2018-12-15: 21:00:00
  Filled 2018-12-15: qty 1

## 2018-12-15 MED ORDER — VITAMIN B-1 100 MG PO TABS
100.0000 mg | ORAL_TABLET | Freq: Every day | ORAL | Status: DC
  Administered 2018-12-17 – 2018-12-20 (×3): 100 mg via ORAL
  Filled 2018-12-15 (×4): qty 1

## 2018-12-15 MED ORDER — LORAZEPAM 1 MG PO TABS: 1.0000 mg | ORAL_TABLET | Freq: Four times a day (QID) | ORAL | Status: AC | PRN

## 2018-12-15 MED ORDER — IOHEXOL 350 MG/ML SOLN
75.0000 mL | Freq: Once | INTRAVENOUS | Status: AC | PRN
  Administered 2018-12-15: 17:00:00 75 mL via INTRAVENOUS

## 2018-12-15 MED ORDER — LEVETIRACETAM IN NACL 1000 MG/100ML IV SOLN
1000.0000 mg | Freq: Once | INTRAVENOUS | Status: AC
  Administered 2018-12-15: 17:00:00 1000 mg via INTRAVENOUS
  Filled 2018-12-15: qty 100

## 2018-12-15 MED ORDER — ASPIRIN 325 MG PO TABS: 325.0000 mg | ORAL_TABLET | Freq: Every day | ORAL | Status: DC

## 2018-12-15 MED ORDER — ASPIRIN 300 MG RE SUPP
300.0000 mg | Freq: Every day | RECTAL | Status: DC
  Administered 2018-12-15 – 2018-12-16 (×2): 300 mg via RECTAL
  Filled 2018-12-15 (×2): qty 1

## 2018-12-15 MED ORDER — LABETALOL HCL 5 MG/ML IV SOLN: 5.0000 mg | INTRAVENOUS | Status: DC | PRN

## 2018-12-15 MED ORDER — LORAZEPAM 2 MG/ML IJ SOLN: 1.0000 mg | Freq: Four times a day (QID) | INTRAMUSCULAR | Status: AC | PRN

## 2018-12-15 MED ORDER — SODIUM CHLORIDE 0.9% FLUSH: 3.0000 mL | Freq: Once | INTRAVENOUS | Status: DC

## 2018-12-15 MED ORDER — SENNOSIDES-DOCUSATE SODIUM 8.6-50 MG PO TABS: 1.0000 | ORAL_TABLET | Freq: Every evening | ORAL | Status: DC | PRN

## 2018-12-15 MED ORDER — LEVETIRACETAM IN NACL 500 MG/100ML IV SOLN
500.0000 mg | Freq: Two times a day (BID) | INTRAVENOUS | Status: DC
  Administered 2018-12-16 – 2018-12-17 (×3): 500 mg via INTRAVENOUS
  Filled 2018-12-15 (×4): qty 100

## 2018-12-15 MED ORDER — ATORVASTATIN CALCIUM 80 MG PO TABS
80.0000 mg | ORAL_TABLET | Freq: Every day | ORAL | Status: DC
  Administered 2018-12-16 – 2018-12-18 (×3): 80 mg via ORAL
  Filled 2018-12-15 (×4): qty 1

## 2018-12-15 MED ORDER — ACETAMINOPHEN 650 MG RE SUPP: 650.0000 mg | RECTAL | Status: DC | PRN

## 2018-12-15 NOTE — Code Documentation (Signed)
Code Stroke called en route at 1556  Per Family last known well last night about 9pm.  About 10 pm he had a fall and left side weakness and facial droop was noted.  "He was not himself" today.  Patient arrived via EMS at 1610.  Stroke team met patient upon arrival initial attempt to draw labs unsuccessful. Stat Head CT done.  IV placed right AC. CTA done.  Labs drawn.  Dr Rory Percy at bedside to assess patient.  NIHSS  8, mild left arm and leg weakness, mild decreased sensory.  Left facial droop and visual field cut.  Dysarthria.  Outside TPA window, not an IR candidate per Dr Rory Percy.  Hand off with ED RN Mallory     Neuro checks and VS q 2 hours.

## 2018-12-15 NOTE — Progress Notes (Signed)
Spoke to Radiologist Dr. Phill Myron who said pt could have MRI due to VP Shunt but needed to have settings evaluated before and after MRI.

## 2018-12-15 NOTE — ED Triage Notes (Signed)
[facility]- pt arrives from home as a code stroke. Pt lives with his daughter who helps care for him. LKW was 2100 last night where the pt experienced left sided facial droop and left sided weakness. At baseline the daughter helps the pt with daily needs, but the pt is ambulatory to bathroom with assistance.

## 2018-12-15 NOTE — ED Notes (Signed)
IV team at bedside 

## 2018-12-15 NOTE — Consult Note (Addendum)
Neurology Consultation  Reason for Consult: code stroke, left sided weakness Referring Physician: Dr. Donnald Garre  CC: left sided weakness  History is obtained from: patient, EMS, family  HPI: Miguel [name] is a 68 y.o. male with PMH of TBI with hydrocephalus s/p VP shunt, seizures, alcohol use disorder, a fib presenting to [facility] with left sided weakness.  Per patient he had onset of left sided weakness last night; he also had a fall at some point yesterday but is unable to communicate any further information.   EMS denies seizure like activity on arrival; they report odor of urine while transferring him.   Patient reportedly experiencing homelessness. EMS found empty bottle of Eliquis by patient which was filled in 2018.  Dr. Wilford Corner spoke with family who report he requires assistance with iADLs. Stepdaughter reports patient fell last night trying to get to bathroom which resulted in him urinating on himself; she denies witnessing seizure like activity. She cannot state clear time of onset of symptoms; she reports he was in his usual state this weekend. She also said that he has not had a PCP for a year and is on no medications since then.  Stat labs reveal normal Cr at 0.70, Hgb 17 (wnl on reference range), electrolytes unremarkable.  LKW: unclear, possibly 5/18  tpa given?: no, outside of window Premorbid modified Rankin scale (mRS): 4 ROS: Unable to obtain due to severe dysarthria  Past Medical History:  Diagnosis Date  . Alcohol abuse   . Atrial fibrillation (HCC)   . Closed head injury 05/2006   hx/notes 11/01/2009  . DVT (deep venous thrombosis) (HCC) 06/2006   [name] 10/19/2009  . Heart murmur   . Hypertension   . 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 Seizures (HCC)    [name] 10/19/2009  . Stroke [facility]) 09/2009   w/right sided weakness/notes 10/31/2009    Family History  Problem Relation Age of Onset  . CVA Father   . Kidney disease Other    Social  History:   reports that he has been smoking cigarettes. He has a 48.00 pack-year smoking history. He has quit using smokeless tobacco.  His smokeless tobacco use included chew. He reports current alcohol use of about 22.0 standard drinks of alcohol per week. He reports current drug use. Drug: Heroin.  Needs assistance with iADLs  Medications  Current Facility-Administered Medications:  .  levETIRAcetam (KEPPRA) IVPB 1000 mg/100 mL premix, 1,000 mg, Intravenous, Once, Nyra Market, MD .  Melene Muller ON 12/16/2018] levETIRAcetam (KEPPRA) IVPB 500 mg/100 mL premix, 500 mg, Intravenous, Q12H, Svalina, Gorica, MD .  sodium chloride flush (NS) 0.9 % injection 3 mL, 3 mL, Intravenous, Once, Pfeiffer, Marcy, MD  Current Outpatient Medications:  .  atorvastatin (LIPITOR) 20 MG tablet, Take 1 tablet (20 mg total) by mouth daily at 6 PM. (Patient not taking: Reported on 06/04/2018), Disp: , Rfl:  .  feeding supplement, ENSURE ENLIVE, (ENSURE ENLIVE) LIQD, Take 237 mLs by mouth 2 (two) times daily between meals. (Patient not taking: Reported on 06/04/2018), Disp: 237 mL, Rfl: 12 .  folic acid (FOLVITE) 1 MG tablet, Take 1 tablet (1 mg total) by mouth daily. (Patient not taking: Reported on 11/10/2015), Disp: 30 tablet, Rfl: 0 .  hydrALAZINE (APRESOLINE) 25 MG tablet, Take 1 tablet (25 mg total) by mouth every 8 (eight) hours. (Patient not taking: Reported on 11/10/2015), Disp: 90 tablet, Rfl: 1 .  levETIRAcetam (KEPPRA) 500 MG tablet, Take 1 tablet (500 mg total)  by mouth 2 (two) times daily. (Patient not taking: Reported on 06/04/2018), Disp: , Rfl:  .  Multiple Vitamin (MULTIVITAMIN WITH MINERALS) TABS tablet, Take 1 tablet by mouth daily. (Patient not taking: Reported on 06/04/2018), Disp: , Rfl:  .  rivaroxaban (XARELTO) 20 MG TABS tablet, Take 1 tablet (20 mg total) by mouth daily with supper. (Patient not taking: Reported on 06/04/2018), Disp: 30 tablet, Rfl:  .  thiamine 100 MG tablet, Take 1 tablet (100 mg  total) by mouth daily. (Patient not taking: Reported on 09/28/2015), Disp: 30 tablet, Rfl: 2  Not taking any medicines for last year due to loss of PCP.  Exam: Current vital signs: BP (!) 182/128 (BP Location: Left Arm)   Pulse 67   Temp 97.6 F (36.4 C) (Oral)   Resp (!) 23   Ht 6' (1.829 m)   Wt 70.3 kg   SpO2 98%   BMI 21.02 kg/m  Vital signs in last 24 hours: Temp:  [97.6 F (36.4 C)] 97.6 F (36.4 C) (05/19 1648) Pulse Rate:  [67] 67 (05/19 1707) Resp:  [22-23] 23 (05/19 1707) BP: (182-205)/(111-128) 182/128 (05/19 1707) SpO2:  [98 %] 98 % (05/19 1707) Weight:  [70.3 kg] 70.3 kg (05/19 1648)  GENERAL: Awake, alert in NAD HEENT:  Normocephalic and atraumatic, dry mm LUNGS: Clear to auscultation bilaterally with no wheezes CV: S1S2 irregularly irregular, no m/r/g, equal pulses bilaterally. ABDOMEN: Soft, nontender, nondistended with normoactive BS Ext: warm, well perfused, intact peripheral pulses, no edema  NEURO:  Mental Status: AA&Ox1 to location only  Language: speech is severely dysarthric. Cranial Nerves: PERRL 24mm/brisk. Left sided visual field cut, EOMI. Left sided facial droop, facial sensation decreased on left, hearing intact, tongue/uvula/soft palate midline, normal sternocleidomastoid and trapezius muscle strength. No evidence of tongue atrophy or fibrillations Motor: left facial droop, 2/5 left upper extremity strength, 3/5 left lower extremity strength; 5/5 strength on right Tone: is normal and bulk is normal Sensation: Decreased sensation on left side throughout Coordination: FTN intact on right, unable to perform on left due to weakness  NIHSS 8  Labs I have reviewed labs in epic and the results pertinent to this consultation are:  CBC    Component Value Date/Time   WBC 7.3 12/15/2018 1612   RBC 4.75 12/15/2018 1612   HGB 17.0 12/15/2018 1618   HCT 50.0 12/15/2018 1618   PLT 162 12/15/2018 1612   MCV 93.5 12/15/2018 1612   MCH 30.1 12/15/2018  1612   MCHC 32.2 12/15/2018 1612   RDW 14.6 12/15/2018 1612   LYMPHSABS 2.8 12/15/2018 1612   MONOABS 0.7 12/15/2018 1612   EOSABS 0.2 12/15/2018 1612   BASOSABS 0.1 12/15/2018 1612    CMP     Component Value Date/Time   NA 137 12/15/2018 1618   K 4.9 12/15/2018 1618   CL 111 12/15/2018 1618   CO2 11 (L) 06/17/2018 0654   GLUCOSE 96 12/15/2018 1618   BUN 17 12/15/2018 1618   CREATININE 0.70 12/15/2018 1618   CALCIUM 8.7 (L) 06/17/2018 0654   PROT 7.5 06/17/2018 0654   ALBUMIN 3.8 06/17/2018 0654   AST 31 06/17/2018 0654   ALT 22 06/17/2018 0654   ALKPHOS 54 06/17/2018 0654   BILITOT 0.7 06/17/2018 0654   GFRNONAA 38 (L) 06/17/2018 0654   GFRAA 44 (L) 06/17/2018 0654    Lipid Panel     Component Value Date/Time   CHOL 149 03/23/2017 1040   TRIG 161 (H) 03/23/2017 2117  HDL 38 (L) 03/23/2017 1040   CHOLHDL 3.9 03/23/2017 1040   VLDL 23 03/23/2017 1040   LDLCALC 88 03/23/2017 1040    Imaging I have reviewed the images obtained:  CT-scan of the brain: No hemorrhage; VP shunt stable in position, encephalomalacia in right frontal lobe and right temporal lobe, stable remote left frontal infarct; stable chronic lacunar infarcts.  CTA head/neck: no LVO; bil vertebral occlusion with reconstitution to PICA bilaterally; proximal basilar occlusion. Bilateral atherosclerosis without stenosis in carotids.  Assessment:  68yo male with PMH of CVA, seizures, TBI with post-traumatic hydrocephalus s/p VP shunt, alcohol use disorder, A fib not on anticoagulation presenting with left sided weakness with unclear onset at some point day prior to admission. He has not been on any medications including anticoagulation for his A fib in the past year according to family. CT head w/o hemorrhage; CTA without LVO.   Etiology could be CVA (cardioembolic due to non compliance to anticoag) vs unwitnessed seizure with post-ictal Todd's paralysis  Recommendations: -Admit to hospitalist   -Telemetry monitoring -Allow for permissive hypertension for the first 24-48h - only treat PRN if SBP >220 mmHg. Blood pressures can be gradually normalized to SBP<140 upon discharge. -MRI brain without contrast -Echocardiogram -HgbA1c, fasting lipid panel -Frequent neuro checks -Prophylactic therapy-Antiplatelet med: Aspirin - dose 325mg  PO or 300mg  PR -Atorvastatin 80 mg PO daily -Risk factor modification -PT consult, OT consult, Speech consult -Keppra 1g load, restart 500mg  BID tomorrow morning -wait to restart Xarelto until MRI results are back as would want to hold in case of large area infarct -Stat EEG ordered  Please page stroke NP/PA/MD (listed on AMION)  from 8am-4 pm as this patient will be followed by the stroke team at this point.  Nyra MarketGorica Svalina, MD PGY3  Attending Neurohospitalist Addendum Patient seen and examined with APP/Resident. Agree with the history and physical as documented above. Agree with the plan as documented, which I helped formulate. I have independently reviewed the chart, obtained history, review of systems and examined the patient.I have personally reviewed pertinent head/neck/spine imaging (CT/MRI). Please feel free to call with any questions. --- Milon DikesAshish Jarmon Javid, MD [facility] Pager: [phone]  If 7pm to 7am, please call on call as listed on AMION.  ADDENDUM Formal EEG read pending. No seizure on prelim read. Stroke w/u as above. Continue AED

## 2018-12-15 NOTE — Progress Notes (Signed)
EEG Complete  Results Pending 

## 2018-12-15 NOTE — ED Notes (Signed)
EEG tech at bedside. 
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
## 2018-12-15 NOTE — ED Notes (Signed)
Pts IV is not working efficiently. IV consulted to place new IV for pt. Keppra paused at this time until new IV can be placed.
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
## 2018-12-16 ENCOUNTER — Observation Stay (HOSPITAL_COMMUNITY): Payer: Medicare Other

## 2018-12-16 ENCOUNTER — Encounter (HOSPITAL_COMMUNITY): Payer: Self-pay | Admitting: General Practice

## 2018-12-16 ENCOUNTER — Inpatient Hospital Stay (HOSPITAL_COMMUNITY): Payer: Medicare Other

## 2018-12-16 DIAGNOSIS — E785 Hyperlipidemia, unspecified: Secondary | ICD-10-CM | POA: Diagnosis present

## 2018-12-16 DIAGNOSIS — I6340 Cerebral infarction due to embolism of unspecified cerebral artery: Secondary | ICD-10-CM

## 2018-12-16 DIAGNOSIS — Z86718 Personal history of other venous thrombosis and embolism: Secondary | ICD-10-CM | POA: Diagnosis not present

## 2018-12-16 DIAGNOSIS — G40909 Epilepsy, unspecified, not intractable, without status epilepticus: Secondary | ICD-10-CM

## 2018-12-16 DIAGNOSIS — Z981 Arthrodesis status: Secondary | ICD-10-CM | POA: Diagnosis not present

## 2018-12-16 DIAGNOSIS — R29810 Facial weakness: Secondary | ICD-10-CM | POA: Diagnosis not present

## 2018-12-16 DIAGNOSIS — Y92009 Unspecified place in unspecified non-institutional (private) residence as the place of occurrence of the external cause: Secondary | ICD-10-CM | POA: Diagnosis not present

## 2018-12-16 DIAGNOSIS — G8194 Hemiplegia, unspecified affecting left nondominant side: Secondary | ICD-10-CM | POA: Diagnosis not present

## 2018-12-16 DIAGNOSIS — Z982 Presence of cerebrospinal fluid drainage device: Secondary | ICD-10-CM | POA: Diagnosis not present

## 2018-12-16 DIAGNOSIS — Z9181 History of falling: Secondary | ICD-10-CM | POA: Diagnosis not present

## 2018-12-16 DIAGNOSIS — F17210 Nicotine dependence, cigarettes, uncomplicated: Secondary | ICD-10-CM | POA: Diagnosis present

## 2018-12-16 DIAGNOSIS — R471 Dysarthria and anarthria: Secondary | ICD-10-CM | POA: Diagnosis not present

## 2018-12-16 DIAGNOSIS — F1290 Cannabis use, unspecified, uncomplicated: Secondary | ICD-10-CM | POA: Diagnosis present

## 2018-12-16 DIAGNOSIS — Z590 Homelessness: Secondary | ICD-10-CM | POA: Diagnosis not present

## 2018-12-16 DIAGNOSIS — I63311 Cerebral infarction due to thrombosis of right middle cerebral artery: Secondary | ICD-10-CM | POA: Diagnosis not present

## 2018-12-16 DIAGNOSIS — R531 Weakness: Secondary | ICD-10-CM

## 2018-12-16 DIAGNOSIS — Z91128 Patient's intentional underdosing of medication regimen for other reason: Secondary | ICD-10-CM | POA: Diagnosis not present

## 2018-12-16 DIAGNOSIS — Z7901 Long term (current) use of anticoagulants: Secondary | ICD-10-CM | POA: Diagnosis not present

## 2018-12-16 DIAGNOSIS — T45516A Underdosing of anticoagulants, initial encounter: Secondary | ICD-10-CM | POA: Diagnosis present

## 2018-12-16 DIAGNOSIS — R29708 NIHSS score 8: Secondary | ICD-10-CM | POA: Diagnosis not present

## 2018-12-16 DIAGNOSIS — I10 Essential (primary) hypertension: Secondary | ICD-10-CM | POA: Diagnosis not present

## 2018-12-16 DIAGNOSIS — F1010 Alcohol abuse, uncomplicated: Secondary | ICD-10-CM | POA: Diagnosis not present

## 2018-12-16 DIAGNOSIS — Z1159 Encounter for screening for other viral diseases: Secondary | ICD-10-CM | POA: Diagnosis not present

## 2018-12-16 DIAGNOSIS — I63411 Cerebral infarction due to embolism of right middle cerebral artery: Secondary | ICD-10-CM | POA: Diagnosis not present

## 2018-12-16 DIAGNOSIS — I160 Hypertensive urgency: Secondary | ICD-10-CM | POA: Diagnosis not present

## 2018-12-16 DIAGNOSIS — I4891 Unspecified atrial fibrillation: Secondary | ICD-10-CM | POA: Diagnosis not present

## 2018-12-16 DIAGNOSIS — Z9114 Patient's other noncompliance with medication regimen: Secondary | ICD-10-CM | POA: Diagnosis not present

## 2018-12-16 DIAGNOSIS — R1310 Dysphagia, unspecified: Secondary | ICD-10-CM | POA: Diagnosis not present

## 2018-12-16 LAB — LIPID PANEL
Cholesterol: 191 mg/dL (ref 0–200)
HDL: 42 mg/dL (ref >40)
LDL Cholesterol: 136 mg/dL — ABNORMAL HIGH (ref 0–99)
Total CHOL/HDL Ratio: 4.5 RATIO
Triglycerides: 67 mg/dL (ref <150)
VLDL: 13 mg/dL (ref 0–40)

## 2018-12-16 LAB — HIV ANTIBODY (ROUTINE TESTING W REFLEX): HIV Screen 4th Generation wRfx: NONREACTIVE

## 2018-12-16 LAB — RAPID URINE DRUG SCREEN, HOSP PERFORMED
Amphetamines: NOT DETECTED
Barbiturates: NOT DETECTED
Benzodiazepines: NOT DETECTED
Cocaine: NOT DETECTED
Opiates: NOT DETECTED
Tetrahydrocannabinol: POSITIVE — AB

## 2018-12-16 LAB — ECHOCARDIOGRAM COMPLETE
Height: 72 in
Weight: 2525.59 oz

## 2018-12-16 LAB — HEMOGLOBIN A1C
Hgb A1c MFr Bld: 5.9 % — ABNORMAL HIGH (ref 4.8–5.6)
Mean Plasma Glucose: 122.63 mg/dL

## 2018-12-16 MED ORDER — SODIUM CHLORIDE 0.9 % IV SOLN
INTRAVENOUS | Status: DC | PRN
  Administered 2018-12-16 – 2018-12-19 (×3): 250 mL via INTRAVENOUS

## 2018-12-16 NOTE — Progress Notes (Signed)
Patient to MRI via bed at 1720hrs

## 2018-12-16 NOTE — Progress Notes (Signed)
Patient back from MRI at 1835hrs.

## 2018-12-16 NOTE — Progress Notes (Addendum)
STROKE TEAM PROGRESS NOTE   INTERVAL HISTORY Pt lying in chair, not in comfortable position. Asked RN to put pt back to bed. Pt seems intermittently agitated due to uncomfortable position. MRI still pending. I called NSG Dr. Wynetta Emery and he said the we do not need pre-MRI evaluation of VPS, but will do post MRI evaluation of the VPS.   Vitals:   12/15/18 2211 12/15/18 2345 12/16/18 0315 12/16/18 0730  BP: (!) 166/85 (!) 163/75 (!) 169/82 (!) 142/111  Pulse: 72 80 77 73  Resp: 18 18 18 17   Temp: 98.8 F (37.1 C) 97.9 F (36.6 C) 97.8 F (36.6 C) (!) 97.5 F (36.4 C)  TempSrc: Oral Oral Oral Axillary  SpO2: 98% 94% 96% 100%  Weight:      Height:        CBC:  Recent Labs  Lab 12/15/18 1612 12/15/18 1618  WBC 7.3  --   NEUTROABS 3.6  --   HGB 14.3 17.0  HCT 44.4 50.0  MCV 93.5  --   PLT 162  --     Basic Metabolic Panel:  Recent Labs  Lab 12/15/18 1612 12/15/18 1618  NA 137 137  K 3.9 4.9  CL 105 111  CO2 20*  --   GLUCOSE 95 96  BUN 11 17  CREATININE 1.04 0.70  CALCIUM 8.6*  --    Lipid Panel:     Component Value Date/Time   CHOL 191 12/16/2018 0406   TRIG 67 12/16/2018 0406   HDL 42 12/16/2018 0406   CHOLHDL 4.5 12/16/2018 0406   VLDL 13 12/16/2018 0406   LDLCALC 136 (H) 12/16/2018 0406   HgbA1c:  Lab Results  Component Value Date   HGBA1C 5.9 (H) 12/16/2018   Urine Drug Screen:     Component Value Date/Time   LABOPIA NONE DETECTED 06/03/2018 0856   COCAINSCRNUR NONE DETECTED 06/03/2018 0856   LABBENZ NONE DETECTED 06/03/2018 0856   AMPHETMU NONE DETECTED 06/03/2018 0856   THCU POSITIVE (A) 06/03/2018 0856   LABBARB NONE DETECTED 06/03/2018 0856    Alcohol Level     Component Value Date/Time   ETH 200 (H) 06/17/2018 0654    IMAGING Ct Angio Head W Or Wo Contrast  Result Date: 12/15/2018 CLINICAL DATA:  Code stroke.  Left-sided weakness. EXAM: CT ANGIOGRAPHY HEAD AND NECK TECHNIQUE: Multidetector CT imaging of the head and neck was performed  using the standard protocol during bolus administration of intravenous contrast. Multiplanar CT image reconstructions and MIPs were obtained to evaluate the vascular anatomy. Carotid stenosis measurements (when applicable) are obtained utilizing NASCET criteria, using the distal internal carotid diameter as the denominator. CONTRAST:  21mL OMNIPAQUE IOHEXOL 350 MG/ML SOLN COMPARISON:  CT head 12/15/2018 FINDINGS: CTA NECK FINDINGS Aortic arch: Mild atherosclerotic disease in the aortic arch and proximal great vessels. Normal arch branching pattern. Proximal great vessels patent without significant stenosis. Right carotid system: Mild atherosclerotic calcification right carotid bulb without significant stenosis Left carotid system: Mild atherosclerotic disease left carotid bifurcation without significant stenosis. 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 Reconstitution of a very small right vertebral artery at the C3 level which supplies small PICA. Occlusion of the proximal left vertebral artery. Reconstitution of a very small distal left vertebral artery at the C2 level which supplies a small left PICA. Skeleton: Posterior cerclage and bony fusion C3 through C5. No acute skeletal abnormality. Other neck: Negative for mass or adenopathy Upper chest: Moderate to severe apically emphysema bilaterally. Review  of the MIP images confirms the above findings CTA HEAD FINDINGS Anterior circulation: Atherosclerotic calcification in the cavernous carotid bilaterally with mild stenosis bilaterally. Mild fusiform enlargement of the right M1 segment. No significant stenosis but mild irregularity suggesting atherosclerotic disease. Right middle cerebral artery branches patent. Both anterior cerebral arteries patent. Small caliber left M1 segment. No significant left MCA stenosis. Posterior circulation: Fetal origin of the left posterior cerebral artery appears to supply the basilar. Basilar is very  small and is occluded proximally. Small right posterior communicating artery. Both posterior cerebral arteries are patent with mild irregularity and mild stenosis on the left. PICA appears supplied from very small vertebral arteries bilaterally. Venous sinuses: Faint venous enhancement due to arterial phase scanning Anatomic variants: None Review of the MIP images confirms the above findings IMPRESSION: 1. Atherosclerotic disease in the carotid bifurcation bilaterally without significant stenosis. Mild stenosis in the cavernous carotid bilaterally. Atherosclerotic fusiform dilatation right M1 segment. 2. 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 Reconstitution of tiny distal vertebral artery bilaterally with supplies PICA bilaterally. Proximal basilar is occluded. Mid distal basilar supplied from the posterior communicating arteries bilaterally. Mild stenosis left PCA. 3. Negative for emergent large vessel occlusion Electronically Signed   By: Marlan Palauharles  Clark M.D.   On: 12/15/2018 16:53   Ct Angio Neck W Or Wo Contrast  Result Date: 12/15/2018 CLINICAL DATA:  Code stroke.  Left-sided weakness. EXAM: CT ANGIOGRAPHY HEAD AND NECK TECHNIQUE: Multidetector CT imaging of the head and neck was performed using the standard protocol during bolus administration of intravenous contrast. Multiplanar CT image reconstructions and MIPs were obtained to evaluate the vascular anatomy. Carotid stenosis measurements (when applicable) are obtained utilizing NASCET criteria, using the distal internal carotid diameter as the denominator. CONTRAST:  75mL OMNIPAQUE IOHEXOL 350 MG/ML SOLN COMPARISON:  CT head 12/15/2018 FINDINGS: CTA NECK FINDINGS Aortic arch: Mild atherosclerotic disease in the aortic arch and proximal great vessels. Normal arch branching pattern. Proximal great vessels patent without significant stenosis. Right carotid system: Mild atherosclerotic calcification right carotid bulb without  significant stenosis Left carotid system: Mild atherosclerotic disease left carotid bifurcation without significant stenosis. 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 Reconstitution of a very small right vertebral artery at the C3 level which supplies small PICA. Occlusion of the proximal left vertebral artery. Reconstitution of a very small distal left vertebral artery at the C2 level which supplies a small left PICA. Skeleton: Posterior cerclage and bony fusion C3 through C5. No acute skeletal abnormality. Other neck: Negative for mass or adenopathy Upper chest: Moderate to severe apically emphysema bilaterally. Review of the MIP images confirms the above findings CTA HEAD FINDINGS Anterior circulation: Atherosclerotic calcification in the cavernous carotid bilaterally with mild stenosis bilaterally. Mild fusiform enlargement of the right M1 segment. No significant stenosis but mild irregularity suggesting atherosclerotic disease. Right middle cerebral artery branches patent. Both anterior cerebral arteries patent. Small caliber left M1 segment. No significant left MCA stenosis. Posterior circulation: Fetal origin of the left posterior cerebral artery appears to supply the basilar. Basilar is very small and is occluded proximally. Small right posterior communicating artery. Both posterior cerebral arteries are patent with mild irregularity and mild stenosis on the left. PICA appears supplied from very small vertebral arteries bilaterally. Venous sinuses: Faint venous enhancement due to arterial phase scanning Anatomic variants: None Review of the MIP images confirms the above findings IMPRESSION: 1. Atherosclerotic disease in the carotid bifurcation bilaterally without significant stenosis. Mild stenosis in the  cavernous carotid bilaterally. Atherosclerotic fusiform dilatation right M1 segment. 2. 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 Reconstitution of tiny  distal vertebral artery bilaterally with supplies PICA bilaterally. Proximal basilar is occluded. Mid distal basilar supplied from the posterior communicating arteries bilaterally. Mild stenosis left PCA. 3. Negative for emergent large vessel occlusion Electronically Signed   By: Marlan Palau M.D.   On: 12/15/2018 16:53   Dg Chest Portable 1 View  Result Date: 12/15/2018 CLINICAL DATA:  Code stroke. EXAM: PORTABLE CHEST 1 VIEW COMPARISON:  03/19/2017 FINDINGS: The lungs are clear without focal pneumonia, edema, pneumothorax or pleural effusion. The cardiopericardial silhouette is within normal limits for size. The visualized bony structures of the thorax are intact. Telemetry leads overlie the chest. Shunt tubing over the right hemithorax again noted. IMPRESSION: No active disease. Electronically Signed   By: Kennith Center M.D.   On: 12/15/2018 18:22   Ct Head Code Stroke Wo Contrast  Result Date: 12/15/2018 CLINICAL DATA:  Code stroke. Initial evaluation for acute left-sided deficits. EXAM: CT HEAD WITHOUT CONTRAST TECHNIQUE: Contiguous axial images were obtained from the base of the skull through the vertex without intravenous contrast. COMPARISON:  Prior CT from 06/17/2018. FINDINGS: Brain: Advanced age-related cerebral atrophy with moderate chronic microvascular ischemic disease. Superimposed extensive bifrontal encephalomalacia, right greater than left with superimposed right frontal dystrophic calcification, stable. Associated ex vacuo dilatation of the frontal horns of both lateral ventricles, also greater on the right. Right parietal approach shunt catheter in place with tip terminating at the septum pellucidum, stable. Ventricular size and morphology unchanged. Left temporal lobe encephalomalacia of compatible with chronic ischemia, stable. Few scattered chronic lacunar infarcts noted involving the right basal ganglia and left thalamus. Small remote right cerebellar infarct. No acute intracranial  hemorrhage. No acute large vessel territory infarct. No mass lesion, midline shift or mass effect. No hydrocephalus. No extra-axial fluid collection. Vascular: No asymmetric hyperdense vessel. Scattered vascular calcifications noted within the carotid siphons. Skull: Right parietal approach VP shunt catheter. Scalp soft tissues demonstrate no acute finding. Calvarium otherwise intact. Sinuses/Orbits: Globes and orbital soft tissues demonstrate no acute finding. Chronic mucosal thickening seen throughout the paranasal sinuses. Mastoids are clear. Other: None. ASPECTS [facility] Stroke Program Early CT Score) - Ganglionic level infarction (caudate, lentiform nuclei, internal capsule, insula, M1-M3 cortex): 7 - Supraganglionic infarction (M4-M6 cortex): 3 Total score (0-10 with 10 being normal): 10 IMPRESSION: 1. No acute intracranial infarct or other abnormality. 2. ASPECTS is 10. 3. Extensive right greater than left bifrontal encephalomalacia with multiple remote ischemic infarcts as above. 4. Right parietal approach VP shunt catheter in place with stable ventricular size and morphology. 5. Underlying cerebral atrophy with chronic microvascular ischemic disease. These results were communicated to [place] at 4:39 pmon 5/19/2020by text page via the [facility] messaging system. Electronically Signed   By: Rise Mu M.D.   On: 12/15/2018 16:45   2D echocardiogram  1. The left ventricle has normal systolic function with an ejection fraction of 60-65%. The cavity size was normal. Left ventricular diastolic Doppler parameters are consistent with impaired relaxation.  2. The right ventricle has normal systolic function. The cavity was normal. There is no increase in right ventricular wall thickness. Right ventricular systolic pressure could not be assessed.  3. No evidence of mitral valve stenosis.  4. The aortic valve was not well visualized. No stenosis of the aortic valve.  EEG This is a technically difficult  study due to the predominance of muscle and movement  artifact.  Normal wakefulness and drowse are noted briefly.  There is some question of rare right frontal sharp waves with phase reversal at F8 that is difficult to distinguish from artifact.  Clinical correlation recommended.   PHYSICAL EXAM  Temp:  [97.5 F (36.4 C)-98.8 F (37.1 C)] 97.5 F (36.4 C) (05/20 1546) Pulse Rate:  [67-80] 69 (05/20 1546) Resp:  [17-23] 18 (05/20 1546) BP: (142-205)/(75-128) 159/95 (05/20 1546) SpO2:  [94 %-100 %] 98 % (05/20 1546) Weight:  [70.3 kg-71.6 kg] 71.6 kg (05/19 2007)  General - Well nourished, well developed, mildly agitated.  Ophthalmologic - fundi not visualized due to noncooperation.  Cardiovascular - Regular rate and rhythm, not in afib but intermittent premature beats.  Neuro - limited exam as pt not cooperative. Awake alert, orientated to place and self, but not to time or people. PERRL, able to track both sides, inconsistently blinking to visual threat, left gaze incomplete. Left facial droop and tongue protrusion not cooperative. Severe dysarthria and intangible.  Left UE 0/5 even with pain, RUE at least 4/5. RLE proximal 3-/5 and distal 3+/5. LLE proximal 0/5 and distal 3/5. Babinski negative bilaterally. Sensation, coordination not cooperative and gait not tested.   ASSESSMENT/PLAN Mr. Miguel [name] is a 68 y.o. male with history of TBI with hydrocephalus s/p VP shunt, seizures, alcohol use disorder, A. fib, homelessness presenting with left-sided weakness following a fall.  No seizure activity.  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  Extensive R > L bifrontal encephalomalacia with multiple old ischemic infarcts.  Right parietal VP shunt with stable ventricular size and morphology.  CTA head & neck No LVO. B ICA atherosclerosis.  Fusiform dilatation of R M1.  Occlusion of proximal and mid the [place].  Proximal BA  occluded.  Mild stenosis L PCA.    MRI pending - will need post MRI NSG Dr. Dutch Quint to evaluate VPS  2D Echo EF 60-65%  UDS positive for THC  HIV neg  LDL 136  HgbA1c 5.9  SCDs for VTE prophylaxis  No antithrombotic prior to admission (was on anticoagulation in the past, but has not seen a physician in greater than 1 year), now on aspirin 300 mg suppository daily.   Therapy recommendations: Pending  Disposition: Pending  Atrial Fibrillation  Home anticoagulation:  none   Was on anticoagulation in the past, but has not seen a physician in greater than 1 year  Now on aspirin 300 mg suppository  Was considered not a AC candidate in the past due to non compliance and fall risk   Seizure    Hx of seizure in 02/2017  Loaded with Keppra followed by 500 IV twice daily   EEG question rare right frontal sharp waves with phase reversal vs. Artifact at F8  No clinical Sz  Hx of stroke  02/2017 admission MRI showed 4mm RIGHT paramedian pons  Considered small vessel disease  Hypertensive urgency  Blood pressure as high as 205/111   Home meds:  none  Normalizing  . Permissive hypertension (OK if < 220/120) but gradually normalize in 5-7 days . 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 fib, noncompliant with medication, treatment plan and potential prognosis. This patient's care requiresreview of multiple databases, neurological assessment, discussion with other specialists and medical decision making of high complexity.  I also discussed with Dr. Pola Corn at bedside.  Marvel Plan, MD PhD Stroke Neurology 12/16/2018 5:04 PM    To contact Stroke Continuity provider, please refer to [email or web address]. After hours, contact General Neurology

## 2018-12-16 NOTE — Procedures (Signed)
ELECTROENCEPHALOGRAM REPORT   Patient: Miguel [name]       Room #: 3T59R EEG No. ID: [id number] Age: 68 y.o.        Sex: male Referring Physician: Dahal Report Date:  12/16/2018        Interpreting Physician: Thana Farr  History: Miguel [name] is an 68 y.o. male with left sided weakness  Medications:  ASA, Lipitor, Folvite, MVI, B1  Conditions of Recording:  This is a 21 channel routine scalp EEG performed with bipolar and monopolar montages arranged in accordance to the international 10/20 system of electrode placement. One channel was dedicated to EKG recording.  The patient is in the awake and drowsy state.Marland Kitchen  Description:  Artifact is prominent during the recording often obscuring the background rhythm. This artifact is particularly prominent over the left hemisphere.  When able to be visualized the posterior background rhythm during wakefulness is noted to be a low voltage, fairly well organized, 8 Hz alpha activity, seen from the parieto-occipital and posterior temporal regions.  Low voltage fast activity, poorly organized, is seen anteriorly and is at times superimposed on more posterior regions.  A mixture of theta and alpha rhythms are seen from the central and temporal regions. The patient drowses with slowing to irregular, low voltage theta and beta activity.   There is some question of rare right frontal sharp waves with phase reversal at F8 that is difficult to distinguish from artifact.  Hyperventilation and intermittent photic stimulation were not performed.  IMPRESSION: This is a technically difficult study due to the predominance of muscle and movement artifact.  Normal wakefulness and drowse are noted briefly.  There is some question of rare right frontal sharp waves with phase reversal at F8 that is difficult to distinguish from artifact.  Clinical correlation recommended.   Thana Farr, MD Neurology [phone] 12/16/2018, 9:44 AM

## 2018-12-16 NOTE — Evaluation (Signed)
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  Patient reportedly had onset of left-sided weakness last night associated with slurred speech. MRI currently pending but CT negative for acute changes.   Clinical Impression  Pt admitted with above diagnosis. Pt currently with functional limitations due to the deficits listed below (see PT Problem List). At the time of PT eval pt was able to perform transfers with up to +2 max assist for balance support and safety. Pt with a significant L lateral lean in standing which made advancement to chair on the R more difficult. Recommending SNF level rehab at d/c to maximize functional independence and decrease risk for falls at d/c. Acutely, pt will benefit from skilled PT to increase their independence and safety with mobility to allow discharge to the venue listed below.       Follow Up Recommendations SNF;Supervision/Assistance - 24 hour    Equipment Recommendations  (TBD by next venue of care)    Recommendations for Other Services       Precautions / Restrictions Precautions Precautions: Fall Restrictions Weight Bearing Restrictions: No      Mobility  Bed Mobility Overal bed mobility: Needs Assistance Bed Mobility: Supine to Sit     Supine to sit: Mod assist;+2 for physical assistance     General bed mobility comments: patient requires increased time to intiate movement with LEs towards EOB, min assist for L LE and mod assist for trunk support to ascend; cueing for hand placement and sequencing   Transfers Overall transfer level: Needs assistance Equipment used: 2 person hand held assist Transfers: Sit to/from [employer]Stand;Stand  Pivot Transfers Sit to Stand: Mod assist;+2 physical assistance Stand pivot transfers: +2 physical assistance;Max assist       General transfer comment: assist to power up in to standing, for balance and safety, pushing heavily towards L side in standing   Ambulation/Gait             General Gait Details: Unable to progress to gait training this session  Stairs            Wheelchair Mobility    Modified Rankin (Stroke Patients Only)       Balance Overall balance assessment: Needs assistance Sitting-balance support: No upper extremity supported;Feet supported Sitting balance-Leahy Scale: Fair Sitting balance - Comments: min guard to min assist for safety statically   Standing balance support: During functional activity;Bilateral upper extremity supported Standing balance-Leahy Scale: Poor Standing balance comment: L lateral lean and pushing, reliant on UE and external support                             Pertinent Vitals/Pain Pain Assessment: Faces Faces Pain Scale: No hurt    Home Living Family/patient expects to be discharged to:: Other (Comment)                 Additional Comments: per chart review lives with family, in a hotel    Prior Function Level of Independence: Independent with assistive device(s)         Comments: reports independent with ADLs and mobility using cane      Hand Dominance   Dominant Hand: Left  Extremity/Trunk Assessment   Upper Extremity Assessment Upper Extremity Assessment: Defer to OT evaluation LUE Deficits / Details: appears flaccid, able to shrug shoulder and extend at elbow but inconsistently; 0/5 grasp  LUE Sensation: WNL(per pt report) LUE Coordination: decreased fine motor;decreased gross motor    Lower Extremity Assessment Lower Extremity Assessment: LLE deficits/detail LLE Deficits / Details: Noted 2/5 strength in L quads, hamstrings, and 1/5 strength in hip flexor.  LLE Sensation:  WNL(Per pt report with light touch testing) LLE Coordination: decreased fine motor;decreased gross motor    Cervical / Trunk Assessment Cervical / Trunk Assessment: Normal(Forward head posture with rounded shoulders)  Communication   Communication: Expressive difficulties(dysarthric)  Cognition Arousal/Alertness: Awake/alert Behavior During Therapy: Flat affect Overall Cognitive Status: Impaired/Different from baseline Area of Impairment: Attention;Memory;Following commands;Safety/judgement;Awareness;Problem solving                   Current Attention Level: Sustained Memory: Decreased short-term memory Following Commands: Follows one step commands inconsistently;Follows one step commands with increased time Safety/Judgement: Decreased awareness of safety;Decreased awareness of deficits Awareness: Intellectual Problem Solving: Slow processing;Decreased initiation;Difficulty sequencing;Requires verbal cues;Requires tactile cues General Comments: pt alert and oriented, follows 75% of commands with increased time, noted slow processing and initation      General Comments      Exercises     Assessment/Plan    PT Assessment Patient needs continued PT services  PT Problem List Decreased strength;Decreased range of motion;Decreased activity tolerance;Decreased balance;Decreased mobility;Decreased knowledge of use of DME;Decreased safety awareness;Decreased knowledge of precautions;Impaired tone       PT Treatment Interventions DME instruction;Gait training;Functional mobility training;Therapeutic activities;Therapeutic exercise;Neuromuscular re-education;Patient/family education    PT Goals (Current goals can be found in the Care Plan section)  Acute Rehab PT Goals Patient Stated Goal: none stated PT Goal Formulation: With patient Time For Goal Achievement: 12/30/18 Potential to Achieve Goals: Good    Frequency Min 4X/week   Barriers to discharge Inaccessible home  environment Per chart review pt and stepdaughter living in hotel. Unsure how permanent living situation is.     Co-evaluation PT/OT/SLP Co-Evaluation/Treatment: Yes Reason for Co-Treatment: For patient/therapist safety;To address functional/ADL transfers PT goals addressed during session: Mobility/safety with mobility;Balance;Strengthening/ROM OT goals addressed during session: ADL's and self-care       AM-PAC PT "6 Clicks" Mobility  Outcome Measure Help needed turning from your back to your side while in a flat bed without using bedrails?: A Little Help needed moving from lying on your back to sitting on the side of a flat bed without using bedrails?: A Lot Help needed moving to and from a bed to a chair (including a wheelchair)?: A Lot Help needed standing up from a chair using your arms (e.g., wheelchair or bedside chair)?: A Lot Help needed to walk in hospital room?: Total Help needed climbing 3-5 steps with a railing? : Total 6 Click Score: 11    End of Session Equipment Utilized During Treatment: Gait belt Activity Tolerance: Patient tolerated treatment well Patient left: in chair;with call bell/phone within reach;with chair alarm set Nurse Communication: Mobility status PT Visit Diagnosis: Unsteadiness on feet (R26.81);Muscle weakness (generalized) (M62.81);History of falling (Z91.81);Other symptoms and signs involving the nervous system (M27.078)    Time: 6754-4920 PT Time Calculation (min) (ACUTE ONLY): 21 min   Charges:   PT Evaluation $PT Eval Moderate Complexity: 1 Mod          Conni Slipper, PT, DPT Acute Rehabilitation Services Pager: [phone] Office: [phone]   Charisse March  Keimora Swartout 12/16/2018, 12:10 PM

## 2018-12-16 NOTE — Progress Notes (Signed)
  Echocardiogram 2D Echocardiogram has been performed.  Celene Skeen 12/16/2018, 1:41 PM

## 2018-12-16 NOTE — ED Notes (Signed)
CT found pt meds in scanner room, meds tubed to 3W, floor is aware.

## 2018-12-16 NOTE — Evaluation (Signed)
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  Patient reportedly had onset of left-sided weakness last night associated with slurred speech. MRI currently pending but CT negative for acute changes.    Clinical Impression   PTA patient reports independent with ADLs, used cane for mobility.  Admitted for above and limited by cognition, impaired balance, L sided (dominant UE) hemiparesis, decreased activity tolerance.  Patient demonstrates ability to follow 1 step commands inconsistently, oriented and alert, requires increased time for processing and intation of tasks, decreased attention to L side.  Patient currently requires min-mod assist for grooming and UB ADLS, max-total assist for LB ADLs, and mod-max assist +2 for transfers.  In standing, presents with heavy L lateral lean pushing at times.  Will benefit from continued OT services while admitted and after dc at SNF level in order to optimize independence and safety with ADLs.  Will follow acutely.    Follow Up Recommendations  SNF;Supervision/Assistance - 24 hour    Equipment Recommendations  Other (comment)(TBD at next venue of care)    Recommendations for Other Services       Precautions / Restrictions Precautions Precautions: Fall Restrictions Weight Bearing Restrictions: No      Mobility Bed Mobility Overal bed mobility: Needs Assistance Bed Mobility: Supine to Sit     Supine to sit: Mod assist;+2 for physical assistance     General bed mobility comments: patient requires increased time to intiate movement with LEs towards EOB, min assist for L LE and mod assist for trunk support to ascend; cueing for  hand placement and sequencing   Transfers Overall transfer level: Needs assistance Equipment used: 2 person hand held assist Transfers: Sit to/from [employer] Sit to Stand: Mod assist;+2 physical assistance Stand pivot transfers: +2 physical assistance;Max assist       General transfer comment: assist to power up in to standing, for balance and safety, pushing towards L side in standing     Balance Overall balance assessment: Needs assistance Sitting-balance support: No upper extremity supported;Feet supported Sitting balance-Leahy Scale: Fair Sitting balance - Comments: min guard to min assist for safety statically   Standing balance support: During functional activity;Bilateral upper extremity supported Standing balance-Leahy Scale: Poor Standing balance comment: L lateral lean and pushing, reliant on UE and external support                           ADL either performed or assessed with clinical judgement   ADL Overall ADL's : Needs assistance/impaired     Grooming: Minimal assistance;Sitting Grooming Details (indicate cue type and reason): assist to wash hands, setup for face Upper Body Bathing: Moderate assistance;Sitting   Lower Body Bathing: Maximal assistance;Sit to/from stand;+2 for physical assistance Lower Body Bathing Details (indicate cue type and reason): impaired balance, mod assist +2 sit<>stand  Upper Body Dressing : Sitting;Maximal assistance   Lower Body Dressing: Total assistance;+2 for physical assistance;Sit to/from stand   Toilet Transfer: Moderate assistance;Stand-pivot Toilet Transfer Details (indicate cue type and reason): simulated to recliner          Functional mobility during ADLs: Moderate assistance;+2 for physical assistance General ADL Comments: pt limited by L  sided hemiparesis, impaired cognition and impaired balance     Vision   Additional Comments: further assessment needed, able to scan room with  increased time; functioanlly reaching for items when asked     Perception     Praxis      Pertinent Vitals/Pain Pain Assessment: Faces Faces Pain Scale: No hurt     Hand Dominance Left   Extremity/Trunk Assessment Upper Extremity Assessment Upper Extremity Assessment: LUE deficits/detail;Generalized weakness LUE Deficits / Details: appears flaccid, able to shrug shoulder and extend at elbow but inconsistently; 0/5 grasp  LUE Sensation: WNL(per pt report) LUE Coordination: decreased fine motor;decreased gross motor   Lower Extremity Assessment Lower Extremity Assessment: Defer to PT evaluation       Communication Communication Communication: Expressive difficulties(dysarthric)   Cognition Arousal/Alertness: Awake/alert Behavior During Therapy: Flat affect Overall Cognitive Status: Impaired/Different from baseline Area of Impairment: Attention;Memory;Following commands;Safety/judgement;Awareness;Problem solving                   Current Attention Level: Sustained Memory: Decreased short-term memory Following Commands: Follows one step commands inconsistently;Follows one step commands with increased time Safety/Judgement: Decreased awareness of safety;Decreased awareness of deficits Awareness: Intellectual Problem Solving: Slow processing;Decreased initiation;Difficulty sequencing;Requires verbal cues;Requires tactile cues General Comments: pt alert and oriented, follows 75% of commands with increased time, noted slow processing and initation   General Comments       Exercises     Shoulder Instructions      Home Living Family/patient expects to be discharged to:: Other (Comment)                                 Additional Comments: per chart review lives with family, in a hotel      Prior Functioning/Environment Level of Independence: Independent with assistive device(s)        Comments: reports independent with ADLs and mobility using  cane         OT Problem List: Decreased strength;Decreased range of motion;Decreased activity tolerance;Impaired balance (sitting and/or standing);Decreased coordination;Decreased cognition;Decreased safety awareness;Decreased knowledge of use of DME or AE;Decreased knowledge of precautions;Impaired tone;Impaired UE functional use      OT Treatment/Interventions: Self-care/ADL training;Neuromuscular education;DME and/or AE instruction;Therapeutic activities;Cognitive remediation/compensation;Patient/family education;Balance training    OT Goals(Current goals can be found in the care plan section) Acute Rehab OT Goals Patient Stated Goal: none stated Time For Goal Achievement: 12/30/18 Potential to Achieve Goals: Good  OT Frequency: Min 3X/week   Barriers to D/C:            Co-evaluation PT/OT/SLP Co-Evaluation/Treatment: Yes Reason for Co-Treatment: For patient/therapist safety;To address functional/ADL transfers   OT goals addressed during session: ADL's and self-care      AM-PAC OT "6 Clicks" Daily Activity     Outcome Measure Help from another person eating meals?: Total(NPO) Help from another person taking care of personal grooming?: A Lot Help from another person toileting, which includes using toliet, bedpan, or urinal?: Total Help from another person bathing (including washing, rinsing, drying)?: A Lot Help from another person to put on and taking off regular upper body clothing?: A Lot Help from another person to put on and taking off regular lower body clothing?: Total 6 Click Score: 9   End of Session Equipment Utilized During Treatment: Gait belt Nurse Communication: Mobility status  Activity Tolerance: Patient tolerated treatment well Patient left: in chair;with call bell/phone within reach;with chair alarm set;with nursing/sitter in  room  OT Visit Diagnosis: Other abnormalities of gait and mobility (R26.89);Muscle weakness (generalized) (M62.81);Hemiplegia and  hemiparesis;Other symptoms and signs involving cognitive function;Cognitive communication deficit (R41.841) Hemiplegia - Right/Left: Left Hemiplegia - dominant/non-dominant: Dominant                Time: [id number] OT Time Calculation (min): 20 min Charges:  OT General Charges $OT Visit: 1 Visit OT Evaluation $OT Eval Moderate Complexity: 1 Mod  Chancy Milroyhristie S Aunesty Tyson, OT Acute Rehabilitation Services Pager [phone] Office [phone]   Chancy MilroyChristie S Kahmya Pinkham 12/16/2018, 10:22 AM
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
## 2018-12-16 NOTE — Progress Notes (Signed)
PROGRESS NOTE  Miguel DohenyRay J Blackstock OZH:[id number] DOB: 19-Oct-1950 DOA: 12/15/2018 PCP: Pa, [facility]   LOS: 0 days   Brief narrative: Miguel [name] is a 68 y.o. 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 He presened to the ED on 5/19 with left-sided weakness. Patient reportedly had left-sided weakness the night prior associated with slurred speech.   Patient apparently had a fall while trying to get to the bathroom which resulted in him urinating on himself.  There was no witnessed seizure-like activity at that time.  He has apparently not been on any of his medications for a year.  In the ED, BP was elevated to 205/111.  Imagings: CT-scan of the brain: No hemorrhage; VP shunt stable in position, encephalomalacia in right frontal lobe and right temporal lobe, stable remote left frontal infarct; stable chronic lacunar infarcts.  CTA head/neck: no LVO; bil vertebral occlusion with reconstitution to PICA bilaterally; proximal basilar occlusion. Bilateral atherosclerosis without stenosis in carotids.  MRI brain - pending.   EEG - normal.  Subjective: Patient was seen and examined this morning.  Elderly African-American male.  Sitting up in chair.  Has facial deviation to the right, continues to have weakness in the left.  Evaluated along with neurology at bedside.  Assessment/Plan:  Principal Problem:   Left-sided weakness Active Problems:   Hypertension   Alcohol abuse   S/P ventriculoperitoneal shunt   Seizure disorder (HCC)   Atrial fibrillation (HCC)  Left-sided weakness and dysarthria: - Concern for acute stroke versus seizure with prolonged postictal phase and Todd's paralysis.   - Imagings as above. - MRI was planned but unable to obtain because patient has a VP shunt in place.  Defer to neurology for any other imaging modality. - EEG  negative.  IV Keppra was loaded in ED.  Defer to neurology for any need of continuation of the ED. - LDL 136, target less than 70.  Lipitor 80 mg daily started. - Hemoglobin A1c 5.9 - Echocardiogram pending. - Patient was supposed to bring Xarelto at home, questionable compliance. Currently Xarelto is on hold till imaging result largely intact. - PT/OT/ST eval. - Blood pressure monitoring.  Permissive hypertension.  History of seizure disorder: - Possible Todd's paralysis as above.  Patient apparently has not been taking home Keppra. - EEG normal. - Continue IV Keppra 500 mg twice daily - Seizure precautions  Atrial fibrillation: Remains in atrial fibrillation with controlled rate.  Noncompliant to Xarelto. -Holding Xarelto as above pending MRI brain  History of TBI status VP shunt: VP shunt catheter in place with stable ventricular size and morphology per CT head.  Hypertension: Allowing for permissive hypertension.  IV labetalol as needed for elevated BP as above.  Alcohol use: Reports last drink 2 days prior to admission. -CIWA protocol  Tobacco use: -Nicotine patch  Mobility: PT eval ordered Diet: Heart healthy diet DVT prophylaxis:  SCDs Code Status:   Code Status: Full Code  Family Communication:  Disposition Plan:  Pending PT eval  Consultants:  Neurology  Procedures:    Antimicrobials:  Anti-infectives (From admission, onward)   None      Infusions:  . sodium chloride 250 mL (12/16/18 0557)  . levETIRAcetam 500 mg (12/16/18 0601)    Scheduled Meds: . aspirin  325 mg Oral Daily   Or  . aspirin  300 mg Rectal Daily  . atorvastatin  80 mg  Oral q1800  . folic acid  1 mg Oral Daily  . multivitamin with minerals  1 tablet Oral Daily  . nicotine  21 mg Transdermal Daily  . sodium chloride flush  3 mL Intravenous Once  . thiamine  100 mg Oral Daily   Or  . thiamine  100 mg Intravenous Daily    PRN meds: sodium chloride, acetaminophen  **OR** acetaminophen (TYLENOL) oral liquid 160 mg/5 mL **OR** acetaminophen, labetalol, LORazepam **OR** LORazepam, senna-docusate   Objective: Vitals:   12/16/18 0315 12/16/18 0730  BP: (!) 169/82 (!) 142/111  Pulse: 77 73  Resp: 18 17  Temp: 97.8 F (36.6 C) (!) 97.5 F (36.4 C)  SpO2: 96% 100%    Intake/Output Summary (Last 24 hours) at 12/16/2018 1231 Last data filed at 12/16/2018 0321 Gross per 24 hour  Intake 120 ml  Output 200 ml  Net -80 ml   Filed Weights   12/15/18 1648 12/15/18 2007  Weight: 70.3 kg 71.6 kg   Weight change:  Body mass index is 21.41 kg/m.   Physical Exam: General exam: Elderly African-American male, sleepy, slumped in chair. Skin: No rashes, lesions or ulcers. HEENT: Atraumatic, normocephalic, supple neck, no obvious bleeding Lungs: Clear to auscultation bilaterally CVS: Regular rate and rhythm, no murmur GI/Abd soft, nontender, nondistended, bowel sound present CNS: Opens eyes on verbal command, facial deviation to the right, left-sided weakness Psychiatry: Depressed look Extremities: No pedal edema, no calf tenderness  Data Review: I have personally reviewed the laboratory data and studies available.  Recent Labs  Lab 12/15/18 1612 12/15/18 1618  WBC 7.3  --   NEUTROABS 3.6  --   HGB 14.3 17.0  HCT 44.4 50.0  MCV 93.5  --   PLT 162  --    Recent Labs  Lab 12/15/18 1612 12/15/18 1618  NA 137 137  K 3.9 4.9  CL 105 111  CO2 20*  --   GLUCOSE 95 96  BUN 11 17  CREATININE 1.04 0.70  CALCIUM 8.6*  --    Lipid Panel     Component Value Date/Time   CHOL 191 12/16/2018 0406   TRIG 67 12/16/2018 0406   HDL 42 12/16/2018 0406   CHOLHDL 4.5 12/16/2018 0406   VLDL 13 12/16/2018 0406   LDLCALC 136 (H) 12/16/2018 0406   Lab Results  Component Value Date   HGBA1C 5.9 (H) 12/16/2018   Lorin Glass, MD  [facility] 12/16/2018

## 2018-12-17 ENCOUNTER — Inpatient Hospital Stay (HOSPITAL_COMMUNITY): Payer: Medicare Other

## 2018-12-17 DIAGNOSIS — I63311 Cerebral infarction due to thrombosis of right middle cerebral artery: Principal | ICD-10-CM

## 2018-12-17 DIAGNOSIS — I498 Other specified cardiac arrhythmias: Secondary | ICD-10-CM

## 2018-12-17 MED ORDER — CLOPIDOGREL BISULFATE 75 MG PO TABS
75.0000 mg | ORAL_TABLET | Freq: Every day | ORAL | Status: DC
  Administered 2018-12-17 – 2018-12-20 (×3): 75 mg via ORAL
  Filled 2018-12-17 (×4): qty 1

## 2018-12-17 MED ORDER — ASPIRIN EC 81 MG PO TBEC
81.0000 mg | DELAYED_RELEASE_TABLET | Freq: Every day | ORAL | Status: DC
  Administered 2018-12-17 – 2018-12-18 (×2): 81 mg via ORAL
  Filled 2018-12-17 (×4): qty 1

## 2018-12-17 MED ORDER — LEVETIRACETAM 500 MG PO TABS
500.0000 mg | ORAL_TABLET | Freq: Two times a day (BID) | ORAL | Status: DC
  Administered 2018-12-17 – 2018-12-18 (×3): 500 mg via ORAL
  Filled 2018-12-17 (×4): qty 1

## 2018-12-17 MED ORDER — SODIUM CHLORIDE 0.9% FLUSH
10.0000 mL | Freq: Two times a day (BID) | INTRAVENOUS | Status: DC
  Administered 2018-12-17 – 2018-12-20 (×6): 10 mL

## 2018-12-17 MED ORDER — SODIUM CHLORIDE 0.9% FLUSH: 10.0000 mL | INTRAVENOUS | Status: DC | PRN

## 2018-12-17 NOTE — Progress Notes (Signed)
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 He presened to the ED on 5/19 with left-sided weakness. Patient reportedly had left-sided weakness the night prior associated with slurred speech.  Patient apparently had a fall while trying to get to the bathroom which resulted in him urinating on himself. There was no witnessed seizure-like activity at that time.  He has apparently not been on any of his medications for a year.  In the ED, BP was elevated to 205/111.  Imagings: CT-scan of the brain: No hemorrhage; VP shunt stable in position, encephalomalacia in right frontal lobe and right temporal lobe, stable remote left frontal infarct; stable chronic lacunar infarcts.  CTA head/neck: no LVO; bil vertebral occlusion with reconstitution to PICA bilaterally; proximal basilar occlusion. Bilateral atherosclerosis without stenosis in carotids.  MRI brain: 1. 14 mm acute ischemic nonhemorrhagic infarct involving the posterior limb of the right internal capsule. 2. Sequelae of prior traumatic brain injury with extensive bifrontal and temporal lobe encephalomalacia, stable from previous.  3. Right parietal approach VP shunt catheter in place with tip terminating at the septum pellucidum. Ventriculomegaly stable from previous.  4. Underlying advanced cerebral atrophy with chronic microvascular ischemic disease.    EEG - normal.  Subjective: Patient was seen and examined this morning.  Propped up in bed.  Lethargic. Struggling with secretions.  Speech therapy consult appreciated. Had MBS, dysphagia 2 diet started.  Assessment/Plan:  Principal Problem:   Left-sided weakness Active Problems:  Hypertension   Alcohol abuse   Smoker   S/P ventriculoperitoneal shunt   Seizure disorder (HCC)   Atrial fibrillation (HCC)   Cerebral embolism with cerebral infarction   Cerebrovascular accident (CVA) due to thrombosis of right middle cerebral artery (HCC)   Sinus arrhythmia  Stroke: Right PLIC infarct more consistent with small vessel disease - Imagings as above. - LDL 136, target less than 70. - Hemoglobin A1c 5.9 - Echocardiogram EF 60 to 65%, no PFO. - Patient was supposed to be on Xarelto at home, questionable compliance.  Apparently he had not seen a physician in more than a year. - Neurology consult appreciated.  Patient has been started on aspirin 81 mg daily and Plavix 75 mg daily.  Continue DAPT for 3 weeks then aspirin alone.   -  Not on a statin at home.  Lipitor 80 mg daily started. - PT/OT/ST eval obtained. - Dysphagia 2 diet per speech therapist recommendation. SNF recommended by PT.  History of seizure disorder: - Patient apparently has not been taking home Keppra. - EEG normal. - Continue IV Keppra 500 mg twice daily - Seizure precautions  ?? Atrial fibrillation: -It is unclear at this time patient has history of A. fib in the past.  He however has risk factors for development of A. fib.  Patient apparently was on Xarelto at home but noncompliant.   At this time on dual antiplatelet agents.  Recommend to follow-up with cardiology as an outpatient.  History of TBI status VP shunt: VP shunt catheter in place with stable ventricular size and morphology per CT head. MRI was obtained after clearance from neurosurgery.  Neurosurgery to evaluate PPS post MRI today.  Hypertensive urgency Blood pressure was elevated to 205/111 on admission.  Not on home medicines.  Allowing permissive hypertension for 24 to 48 hours.   If remains high, plan to start on oral meds from tomorrow. IV labetalol as needed for elevated BP more than 220/120  Alcohol use: Reports last  drink 2 days prior to admission. Currently on CIWA protocol.  Continue folic acid vitamin B1 and multivitamins.  Counseled to quit alcohol.  Tobacco use: -Nicotine patch  Mobility: PT eval ordered Diet: Heart healthy diet DVT prophylaxis: SCDs Code Status:  Code Status: Full Code  Family Communication: Disposition Plan: Pending PT eval  Consultants:  Neurology  Procedures:    Antimicrobials:  Anti-infectives (From admission, onward)   None      Infusions:  . sodium chloride Stopped (12/16/18 2236)    Scheduled Meds: . aspirin EC  81 mg Oral Daily  . atorvastatin  80 mg Oral q1800  . clopidogrel  75 mg Oral Daily  . folic acid  1 mg Oral Daily  . levETIRAcetam  500 mg Oral BID  . multivitamin with minerals  1 tablet Oral Daily  . nicotine  21 mg Transdermal Daily  . sodium chloride flush  10-40 mL Intracatheter Q12H  . sodium chloride flush  3 mL Intravenous Once  . thiamine  100 mg Oral Daily    PRN meds: sodium chloride, acetaminophen **OR** acetaminophen (TYLENOL) oral liquid 160 mg/5 mL **OR** acetaminophen, labetalol, LORazepam **OR** LORazepam, senna-docusate, sodium chloride flush   Objective: Vitals:   12/17/18 0844 12/17/18 1151  BP: (!) 159/82 108/60  Pulse: 74 85  Resp: 20 16  Temp: 98 F (36.7 C) 98.2 F (36.8 C)  SpO2: 97% 100%    Intake/Output Summary (Last 24 hours) at 12/17/2018 1425 Last data filed at 12/17/2018 1404 Gross per 24 hour  Intake 325.04 ml  Output 700 ml  Net -374.96 ml   Filed Weights   12/15/18 1648 12/15/18 2007  Weight: 70.3 kg 71.6 kg   Weight change:  Body mass index is 21.41 kg/m.   Physical Exam: General exam: Elderly African-American male.  Lying down in bed.  Lethargic, opens eyes on verbal command, dysarthric voice. Skin: No rashes, lesions or ulcers. HEENT: Atraumatic, normocephalic, supple neck, no obvious bleeding Lungs: Gurgling sounds transmitted from throat CVS: First and second heart  sound heard, no murmur GI/Abd soft, nontender, nondistended, bowel sound present CNS: Drowsy but opens eyes on verbal command, able to follows commands on the right side, left hemiparesis present  psychiatry: Depressed mood Extremities: No pedal edema, no calf tenderness  Data Review: I have personally reviewed the laboratory data and studies available.  Recent Labs  Lab 12/15/18 1612 12/15/18 1618  WBC 7.3  --   NEUTROABS 3.6  --   HGB 14.3 17.0  HCT 44.4 50.0  MCV 93.5  --   PLT 162  --    Recent Labs  Lab 12/15/18 1612 12/15/18 1618  NA 137 137  K 3.9 4.9  CL 105 111  CO2 20*  --   GLUCOSE 95 96  BUN 11 17  CREATININE 1.04 0.70  CALCIUM 8.6*  --    Lipid Panel     Component Value Date/Time   CHOL 191 12/16/2018 0406   TRIG 67 12/16/2018 0406   HDL 42 12/16/2018 0406   CHOLHDL 4.5 12/16/2018 0406   VLDL 13 12/16/2018 0406   LDLCALC 136 (H) 12/16/2018 0406    Lorin GlassBinaya Andrew Blasius, MD  [facility] 12/17/2018

## 2018-12-17 NOTE — TOC Initial Note (Addendum)
Transition of Care [facility]) - Initial/Assessment Note    Patient Details  Name: Miguel [name] MRN: [id number] Date of Birth: 08-Sep-1950  Transition of Care [facility]) CM/SW Contact:    Geralynn Ochs, LCSW Phone Number: 12/17/2018, 3:25 PM  Clinical Narrative:    CSW met with patient to discuss discharge plan. CSW discussed recommendation for SNF placement, and patient said "no". CSW asked about how the patient would go home due to his weakness, and the patient said he'd go home with his daughter. CSW also discussed patient's substance use, how the doctors wanted him to receive assistance. CSW asked if patient would like counseling resources, and he said no. CSW received permission to contact his daughter to discuss him going home. CSW spoke with Miguel [name], who agreed that she would take the patient home, refusing SNF. CSW read to [name] what the patient was able to do with therapy, highlighting that he required two people for transfers and is not walking. Miguel [name] still adamant about taking him home because she is afraid of him catching the coronavirus in a facility somewhere. Patient has a rolling walker at home, but no other equipment. Will need to be evaluated for equipment needs if he goes home. Daughter also requesting a urinal for him so maybe he won't have to get up so much, as he is not able to get up and walk right now. Daughter also agreeable to home health, requested [facility]. CSW gave referral.               Expected Discharge Plan: [place] Barriers to Discharge: Continued Medical Work up   Patient Goals and CMS Choice Patient states their goals for this hospitalization and ongoing recovery are:: go home with his daughter CMS Medicare.gov Compare Post Acute Care list provided to:: Patient Represenative (must comment) Choice offered to / list presented to : Adult Children  Expected Discharge Plan and Services Expected Discharge Plan: [place] Choice: Durable Medical Equipment, Home Health Living arrangements for the past 2 months: Apartment                           HH Arranged: RN, Speech Therapy, PT, OT, Nurse's Aide, Social Work, Refused SNF [place] Agency: [place] ([facility]) Date [name]: 12/17/18 Time [place]: 85 Representative spoke with at [place]: Butch Penny  Prior Living Arrangements/Services Living arrangements for the past 2 months: [place] with:: Self, Adult Children Patient language and need for interpreter reviewed:: No Do you feel safe going back to the place where you live?: Yes      Need for Family Participation in Patient Care: Yes (Comment) Care giver support system in place?: Yes (comment)   Criminal Activity/Legal Involvement Pertinent to Current Situation/Hospitalization: No - Comment as needed  Activities of Daily Living      Permission Sought/Granted Permission sought to share information with : Family Supports Permission granted to share information with : Yes, Verbal Permission Granted  Share Information with NAME: Miguel [name]     Permission granted to share info w Relationship: Daughter     Emotional Assessment Appearance:: Appears stated age Attitude/Demeanor/Rapport: Engaged, Lethargic Affect (typically observed): Appropriate Orientation: : Oriented to Self, Oriented to Place, Oriented to  Time, Oriented to Situation Alcohol / Substance Use: Alcohol Use Psych Involvement: No (comment)  Admission diagnosis:  Left-sided weakness [R53.1] Suspected cerebrovascular accident (CVA) [  R09.89] Patient Active Problem List   Diagnosis Date Noted  . Cerebrovascular accident (CVA) due to thrombosis of right middle cerebral artery ([place])   . Sinus arrhythmia   . Cerebral embolism with cerebral infarction 12/16/2018  . Left-sided weakness 12/15/2018  . Atrial fibrillation ([place]) 12/15/2018  . Seizure disorder ([place]) 10/18/2014  . Acute renal  failure (ARF) ([place]) 09/07/2014  . Acute encephalopathy 09/07/2014  . Homelessness 09/07/2014  . Dilantin level too low 09/07/2014  . 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 Alcohol abuse 02/25/2013  . Dilantin toxicity 02/25/2013  . Smoker 02/25/2013  . Noncompliance 02/25/2013  . Seizures ([place])   . Hypertension    PCP:  Pa, [facility] Pharmacy:   [facility] [place] [place] [address] Phone: [phone] Fax: [phone]     Social Determinants of Health (SDOH) Interventions    Readmission Risk Interventions No flowsheet data found.

## 2018-12-17 NOTE — Progress Notes (Addendum)
   Providing Compassionate, Quality Care - Together  VP shunt at 2.5 upon assessment. Reprogrammed to 1.0. Patient somnolent, but arousable. Oriented to self and place. Denies headache.  Val Eagle, DNP, AGNP-C Nurse Practitioner 4:56 PM   [facility] [address], [address], [place], [place] [address] P: [phone]    F: [phone]

## 2018-12-17 NOTE — Progress Notes (Signed)
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  Patient reportedly had onset of left-sided weakness last night associated with slurred speech. MRI currently pending but CT negative for acute changes.     PT Comments    Patient continues to require significant assist for transfers. He required +2 assistance to stand. He was able to stand 3x. He was unable to take side steps despite max a. He was able to move his right leg but could not move his left. Patients daughter hopes to bring him home. Therapy clalled the patients daughter and talked with her about the level of care he needs. She feels like she can meet that level of care. She will require a wheelchair and a bed side commode. Therapy advised her that he will require 2 people to transfer him to a bed side commode and he may not be able to verbalize when he needs to go. She reports she understands but does not want him to go to a SNF because of covid-19. Therapy will continue to work with the patient.   Follow Up Recommendations  SNF;Supervision/Assistance - 24 hour     Equipment Recommendations  Rolling walker with 5" wheels;Wheelchair (measurements PT);3in1 (PT)    Recommendations for Other Services       Precautions / Restrictions Precautions Precautions: Fall Restrictions Weight Bearing Restrictions: No    Mobility  Bed Mobility Overal bed mobility: Needs Assistance Bed Mobility: Supine to Sit     Supine to sit: Mod assist;+2 for physical assistance     General bed mobility comments: Patient abel to assist slightly with u  Transfers Overall transfer level: Needs assistance Equipment used: Rolling walker (2  wheeled)   Sit to Stand: +2 physical assistance;Mod assist;Max assist         General transfer comment: Patient required amx a on his first trial to stand. He had difficulty maintianing his left arm on the walker. The next two trials he required mod a to stand. Each trial he was able to stand for 30 seconds with miod a for balance. He leaned towards the oleft in standing. Therapy attmpeted to have him side step but he was only able to slightly move his left leg.   Ambulation/Gait                 Stairs             Wheelchair Mobility    Modified Rankin (Stroke Patients Only)       Balance Overall balance assessment: Needs assistance Sitting-balance support: No upper extremity supported;Feet supported Sitting balance-Leahy Scale: Fair Sitting balance - Comments: min guard to min assist for safety statically   Standing balance support: During functional activity;Bilateral upper extremity supported Standing balance-Leahy Scale: Poor Standing balance comment: L lateral lean and pushing, reliant on UE and external support                            Cognition Arousal/Alertness: Lethargic Behavior During Therapy: Flat affect Overall Cognitive Status: Impaired/Different from baseline Area of Impairment: Attention;Memory;Following commands;Safety/judgement;Awareness;Problem solving  Current Attention Level: Sustained Memory: Decreased short-term memory Following Commands: Follows one step commands inconsistently;Follows one step commands with increased time Safety/Judgement: Decreased awareness of safety;Decreased awareness of deficits Awareness: Intellectual Problem Solving: Slow processing;Decreased initiation;Difficulty sequencing;Requires verbal cues;Requires tactile cues General Comments: pt alert and oriented, follows 75% of commands with increased time, noted slow processing and initation      Exercises Other  Exercises Other Exercises: Worked on assisted long arc of the left leg. Patient does have some muscle contraction. Also had the patient contract quad muscle in bed x20. Patient alos able to perform leg press agaisnt the therpaist with the left leg.     General Comments        Pertinent Vitals/Pain Pain Assessment: Faces Faces Pain Scale: No hurt    Home Living                      Prior Function            PT Goals (current goals can now be found in the care plan section) Acute Rehab PT Goals Patient Stated Goal: none stated PT Goal Formulation: With patient Time For Goal Achievement: 12/30/18 Potential to Achieve Goals: Good Progress towards PT goals: Progressing toward goals    Frequency    Min 4X/week      PT Plan Current plan remains appropriate    Co-evaluation              AM-PAC PT "6 Clicks" Mobility   Outcome Measure  Help needed turning from your back to your side while in a flat bed without using bedrails?: A Little Help needed moving from lying on your back to sitting on the side of a flat bed without using bedrails?: A Lot Help needed moving to and from a bed to a chair (including a wheelchair)?: A Lot Help needed standing up from a chair using your arms (e.g., wheelchair or bedside chair)?: A Lot Help needed to walk in hospital room?: Total Help needed climbing 3-5 steps with a railing? : Total 6 Click Score: 11    End of Session Equipment Utilized During Treatment: Gait belt Activity Tolerance: Patient tolerated treatment well Patient left: in chair;with call bell/phone within reach;with chair alarm set Nurse Communication: Mobility status PT Visit Diagnosis: Unsteadiness on feet (R26.81);Muscle weakness (generalized) (M62.81);History of falling (Z91.81);Other symptoms and signs involving the nervous system (R29.898)     Time: 7322-0254 PT Time Calculation (min) (ACUTE ONLY): 20 min  Charges:  $Therapeutic Activity: 8-22  mins                       Dessie Coma PT DPT  12/17/2018, 4:11 PM

## 2018-12-17 NOTE — Progress Notes (Signed)
Modified Barium Swallow Progress Note  Patient Details  Name: Miguel [name] MRN: [id number] Date of Birth: 04/09/1951  Today's Date: 12/17/2018  Modified Barium Swallow completed.  Full report located under Chart Review in the Imaging Section.  Brief recommendations include the following:  Clinical Impression  Patient presents with a moderate oropharyngeal dysphagia characterized by silent pentration and sensed aspiration of thin liquids, flash penetration of nectar thick liquids, swallow initiation delays to level of vallecular sinus with purees, honey thick liquids and regular solids and to level of pyriform sinus with nectar thick and thin liquids. Patient did exhibit mild vallecular residuals with puree solids, nectar and honey thick liquids, but majority of residuals cleared with subsequent swallows. Patient is missing most of his dentition, which impacted mastication, however he also has a moderate delay oral transit of puree solids, honey thick liquids and regular solids boluses. Aspiration with thin liquids occured during the swallow and although patient did react with cough during aspiration, it was too late and not effective to transit aspirate back into laryngeal or pharyngeal vestibule.    Swallow Evaluation Recommendations       SLP Diet Recommendations: Nectar thick liquid;Dysphagia 2 (Fine chop) solids   Liquid Administration via: Cup   Medication Administration: Crushed with puree   Supervision: Staff to assist with self feeding;Full supervision/cueing for compensatory strategies   Compensations: Minimize environmental distractions;Slow rate;Small sips/bites   Postural Changes: Seated upright at 90 degrees   Oral Care Recommendations: Oral care BID   Other Recommendations: Order thickener from pharmacy;Prohibited food (jello, ice cream, thin soups);Remove water pitcher;Clarify dietary restrictions    Pablo Lawrence 12/17/2018,3:03 PM   Angela Nevin, MA,  CCC-SLP Speech Therapy [facility] Acute Rehab Pager: [phone]

## 2018-12-17 NOTE — Plan of Care (Signed)
Progressing towards goals

## 2018-12-17 NOTE — Progress Notes (Signed)
STROKE TEAM PROGRESS NOTE   INTERVAL HISTORY Pt lying in chair, nursing tech at bedside for bath. Pt seems more comfortable than yesterday. He is awake alert but very lethargic and dysarthric. Just had MBS and put on Dys2 with nectar thick. He still has left UE flaccid and left LE paresis with left facial droop.    Vitals:   12/16/18 1546 12/16/18 1917 12/16/18 2327 12/17/18 0315  BP: (!) 159/95 (!) 148/105 (!) 141/86 (!) 151/91  Pulse: 69 78 81 88  Resp: 18 18 18 18   Temp: (!) 97.5 F (36.4 C) 97.9 F (36.6 C) 98 F (36.7 C) 97.8 F (36.6 C)  TempSrc: Axillary Axillary Axillary Axillary  SpO2: 98% 99% 98% 99%  Weight:      Height:        CBC:  Recent Labs  Lab 12/15/18 1612 12/15/18 1618  WBC 7.3  --   NEUTROABS 3.6  --   HGB 14.3 17.0  HCT 44.4 50.0  MCV 93.5  --   PLT 162  --     Basic Metabolic Panel:  Recent Labs  Lab 12/15/18 1612 12/15/18 1618  NA 137 137  K 3.9 4.9  CL 105 111  CO2 20*  --   GLUCOSE 95 96  BUN 11 17  CREATININE 1.04 0.70  CALCIUM 8.6*  --    Lipid Panel:     Component Value Date/Time   CHOL 191 12/16/2018 0406   TRIG 67 12/16/2018 0406   HDL 42 12/16/2018 0406   CHOLHDL 4.5 12/16/2018 0406   VLDL 13 12/16/2018 0406   LDLCALC 136 (H) 12/16/2018 0406   HgbA1c:  Lab Results  Component Value Date   HGBA1C 5.9 (H) 12/16/2018   Urine Drug Screen:     Component Value Date/Time   LABOPIA NONE DETECTED 12/16/2018 0706   COCAINSCRNUR NONE DETECTED 12/16/2018 0706   LABBENZ NONE DETECTED 12/16/2018 0706   AMPHETMU NONE DETECTED 12/16/2018 0706   THCU POSITIVE (A) 12/16/2018 0706   LABBARB NONE DETECTED 12/16/2018 0706    Alcohol Level     Component Value Date/Time   ETH 200 (H) 06/17/2018 0654    IMAGING Ct Angio Head W Or Wo Contrast  Result Date: 12/15/2018 CLINICAL DATA:  Code stroke.  Left-sided weakness. EXAM: CT ANGIOGRAPHY HEAD AND NECK TECHNIQUE: Multidetector CT imaging of the head and neck was performed using  the standard protocol during bolus administration of intravenous contrast. Multiplanar CT image reconstructions and MIPs were obtained to evaluate the vascular anatomy. Carotid stenosis measurements (when applicable) are obtained utilizing NASCET criteria, using the distal internal carotid diameter as the denominator. CONTRAST:  75mL OMNIPAQUE IOHEXOL 350 MG/ML SOLN COMPARISON:  CT head 12/15/2018 FINDINGS: CTA NECK FINDINGS Aortic arch: Mild atherosclerotic disease in the aortic arch and proximal great vessels. Normal arch branching pattern. Proximal great vessels patent without significant stenosis. Right carotid system: Mild atherosclerotic calcification right carotid bulb without significant stenosis Left carotid system: Mild atherosclerotic disease left carotid bifurcation without significant stenosis. 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 Reconstitution of a very small right vertebral artery at the C3 level which supplies small PICA. Occlusion of the proximal left vertebral artery. Reconstitution of a very small distal left vertebral artery at the C2 level which supplies a small left PICA. Skeleton: Posterior cerclage and bony fusion C3 through C5. No acute skeletal abnormality. Other neck: Negative for mass or adenopathy Upper chest: Moderate to severe apically emphysema bilaterally. Review of the MIP  images confirms the above findings CTA HEAD FINDINGS Anterior circulation: Atherosclerotic calcification in the cavernous carotid bilaterally with mild stenosis bilaterally. Mild fusiform enlargement of the right M1 segment. No significant stenosis but mild irregularity suggesting atherosclerotic disease. Right middle cerebral artery branches patent. Both anterior cerebral arteries patent. Small caliber left M1 segment. No significant left MCA stenosis. Posterior circulation: Fetal origin of the left posterior cerebral artery appears to supply the basilar. Basilar is very small  and is occluded proximally. Small right posterior communicating artery. Both posterior cerebral arteries are patent with mild irregularity and mild stenosis on the left. PICA appears supplied from very small vertebral arteries bilaterally. Venous sinuses: Faint venous enhancement due to arterial phase scanning Anatomic variants: None Review of the MIP images confirms the above findings IMPRESSION: 1. Atherosclerotic disease in the carotid bifurcation bilaterally without significant stenosis. Mild stenosis in the cavernous carotid bilaterally. Atherosclerotic fusiform dilatation right M1 segment. 2. 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 Reconstitution of tiny distal vertebral artery bilaterally with supplies PICA bilaterally. Proximal basilar is occluded. Mid distal basilar supplied from the posterior communicating arteries bilaterally. Mild stenosis left PCA. 3. Negative for emergent large vessel occlusion Electronically Signed   By: Marlan Palau M.D.   On: 12/15/2018 16:53   Ct Angio Neck W Or Wo Contrast  Result Date: 12/15/2018 CLINICAL DATA:  Code stroke.  Left-sided weakness. EXAM: CT ANGIOGRAPHY HEAD AND NECK TECHNIQUE: Multidetector CT imaging of the head and neck was performed using the standard protocol during bolus administration of intravenous contrast. Multiplanar CT image reconstructions and MIPs were obtained to evaluate the vascular anatomy. Carotid stenosis measurements (when applicable) are obtained utilizing NASCET criteria, using the distal internal carotid diameter as the denominator. CONTRAST:  75mL OMNIPAQUE IOHEXOL 350 MG/ML SOLN COMPARISON:  CT head 12/15/2018 FINDINGS: CTA NECK FINDINGS Aortic arch: Mild atherosclerotic disease in the aortic arch and proximal great vessels. Normal arch branching pattern. Proximal great vessels patent without significant stenosis. Right carotid system: Mild atherosclerotic calcification right carotid bulb without significant  stenosis Left carotid system: Mild atherosclerotic disease left carotid bifurcation without significant stenosis. 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 Reconstitution of a very small right vertebral artery at the C3 level which supplies small PICA. Occlusion of the proximal left vertebral artery. Reconstitution of a very small distal left vertebral artery at the C2 level which supplies a small left PICA. Skeleton: Posterior cerclage and bony fusion C3 through C5. No acute skeletal abnormality. Other neck: Negative for mass or adenopathy Upper chest: Moderate to severe apically emphysema bilaterally. Review of the MIP images confirms the above findings CTA HEAD FINDINGS Anterior circulation: Atherosclerotic calcification in the cavernous carotid bilaterally with mild stenosis bilaterally. Mild fusiform enlargement of the right M1 segment. No significant stenosis but mild irregularity suggesting atherosclerotic disease. Right middle cerebral artery branches patent. Both anterior cerebral arteries patent. Small caliber left M1 segment. No significant left MCA stenosis. Posterior circulation: Fetal origin of the left posterior cerebral artery appears to supply the basilar. Basilar is very small and is occluded proximally. Small right posterior communicating artery. Both posterior cerebral arteries are patent with mild irregularity and mild stenosis on the left. PICA appears supplied from very small vertebral arteries bilaterally. Venous sinuses: Faint venous enhancement due to arterial phase scanning Anatomic variants: None Review of the MIP images confirms the above findings IMPRESSION: 1. Atherosclerotic disease in the carotid bifurcation bilaterally without significant stenosis. Mild stenosis in the cavernous carotid bilaterally.  Atherosclerotic fusiform dilatation right M1 segment. 2. 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 Reconstitution of tiny distal  vertebral artery bilaterally with supplies PICA bilaterally. Proximal basilar is occluded. Mid distal basilar supplied from the posterior communicating arteries bilaterally. Mild stenosis left PCA. 3. Negative for emergent large vessel occlusion Electronically Signed   By: Marlan Palau M.D.   On: 12/15/2018 16:53   Mr Brain Wo Contrast  Result Date: 12/16/2018 CLINICAL DATA:  Initial evaluation for acute focal neural deficit, left-sided weakness. History of TBI, stroke, hydrocephalus, status post VP shunt. EXAM: MRI HEAD WITHOUT CONTRAST TECHNIQUE: Multiplanar, multiecho pulse sequences of the brain and surrounding structures were obtained without intravenous contrast. COMPARISON:  Prior CT and CTA from 12/15/2018 as well as previous MRI from 03/18/2017. FINDINGS: Brain: Moderately advanced cerebral atrophy for age. Underlying mild to moderate chronic microvascular ischemic changes present within the periventricular white matter. Superimposed few scattered remote lacunar infarcts, most notable at the left thalamus. Extensive encephalomalacia and gliosis seen involving the anterior frontal and temporal lobes bilaterally, most likely related to traumatic brain injury. 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 Focal dural thickening and/or chronic extra-axial hygroma overlying the bilateral frontal convexities measure up to 3 mm on the right and 7 mm on the left (series 4, image 16), stable from previous. Associated ex vacuo dilatation of the frontal horns of both lateral ventricles, right greater than left. Right parietal approach VP shunt catheter in place with tip terminating at the septum pellucidum. Susceptibility artifact about the shunt reservoir at the right occipital scalp. Diffuse ventricular prominence relatively stable from previous. No mass lesion or new midline shift. 14 mm focus of restricted diffusion seen involving the posterior limb of the right internal capsule, compatible with acute  ischemic infarct (series 3, image 25). No associated hemorrhage or mass effect. No other evidence for acute or subacute ischemia. Gray-white matter differentiation otherwise maintained. Vascular: Major intracranial vascular flow voids grossly maintained at the skull base. Diminutive vertebrobasilar system noted. Skull and upper cervical spine: Craniocervical junction within normal limits. Upper cervical spine normal. Bone marrow signal intensity within normal limits. No acute scalp soft tissue abnormality. Susceptibility artifact from shunt reservoir or at the right occipital scalp. Sinuses/Orbits: No acute abnormality about the globes orbital soft tissues. Scattered chronic mucosal thickening throughout the paranasal sinuses, most notable at the right frontal sinus and ethmoidal air cells. No air-fluid level to suggest acute sinusitis. Small left mastoid effusion noted, of doubtful significance. Other: None. IMPRESSION: 1. 14 mm acute ischemic nonhemorrhagic infarct involving the posterior limb of the right internal capsule. 2. Sequelae of prior traumatic brain injury with extensive bifrontal and temporal lobe encephalomalacia, stable from previous. 3. Right parietal approach VP shunt catheter in place with tip terminating at the septum pellucidum. Ventriculomegaly stable from previous. 4. Underlying advanced cerebral atrophy with chronic microvascular ischemic disease. Electronically Signed   By: Rise Mu M.D.   On: 12/16/2018 18:54   Dg Chest Portable 1 View  Result Date: 12/15/2018 CLINICAL DATA:  Code stroke. EXAM: PORTABLE CHEST 1 VIEW COMPARISON:  03/19/2017 FINDINGS: The lungs are clear without focal pneumonia, edema, pneumothorax or pleural effusion. The cardiopericardial silhouette is within normal limits for size. The visualized bony structures of the thorax are intact. Telemetry leads overlie the chest. Shunt tubing over the right hemithorax again noted. IMPRESSION: No active disease.  Electronically Signed   By: Kennith Center M.D.   On: 12/15/2018 18:22   Ct Head Code Stroke Wo  Contrast  Result Date: 12/15/2018 CLINICAL DATA:  Code stroke. Initial evaluation for acute left-sided deficits. EXAM: CT HEAD WITHOUT CONTRAST TECHNIQUE: Contiguous axial images were obtained from the base of the skull through the vertex without intravenous contrast. COMPARISON:  Prior CT from 06/17/2018. FINDINGS: Brain: Advanced age-related cerebral atrophy with moderate chronic microvascular ischemic disease. Superimposed extensive bifrontal encephalomalacia, right greater than left with superimposed right frontal dystrophic calcification, stable. Associated ex vacuo dilatation of the frontal horns of both lateral ventricles, also greater on the right. Right parietal approach shunt catheter in place with tip terminating at the septum pellucidum, stable. Ventricular size and morphology unchanged. Left temporal lobe encephalomalacia of compatible with chronic ischemia, stable. Few scattered chronic lacunar infarcts noted involving the right basal ganglia and left thalamus. Small remote right cerebellar infarct. No acute intracranial hemorrhage. No acute large vessel territory infarct. No mass lesion, midline shift or mass effect. No hydrocephalus. No extra-axial fluid collection. Vascular: No asymmetric hyperdense vessel. Scattered vascular calcifications noted within the carotid siphons. Skull: Right parietal approach VP shunt catheter. Scalp soft tissues demonstrate no acute finding. Calvarium otherwise intact. Sinuses/Orbits: Globes and orbital soft tissues demonstrate no acute finding. Chronic mucosal thickening seen throughout the paranasal sinuses. Mastoids are clear. Other: None. ASPECTS [facility] Stroke Program Early CT Score) - Ganglionic level infarction (caudate, lentiform nuclei, internal capsule, insula, M1-M3 cortex): 7 - Supraganglionic infarction (M4-M6 cortex): 3 Total score (0-10 with 10 being  normal): 10 IMPRESSION: 1. No acute intracranial infarct or other abnormality. 2. ASPECTS is 10. 3. Extensive right greater than left bifrontal encephalomalacia with multiple remote ischemic infarcts as above. 4. Right parietal approach VP shunt catheter in place with stable ventricular size and morphology. 5. Underlying cerebral atrophy with chronic microvascular ischemic disease. These results were communicated to [place] at 4:39 pmon 5/19/2020by text page via the [facility] messaging system. Electronically Signed   By: Rise Mu M.D.   On: 12/15/2018 16:45   2D echocardiogram  1. The left ventricle has normal systolic function with an ejection fraction of 60-65%. The cavity size was normal. Left ventricular diastolic Doppler parameters are consistent with impaired relaxation.  2. The right ventricle has normal systolic function. The cavity was normal. There is no increase in right ventricular wall thickness. Right ventricular systolic pressure could not be assessed.  3. No evidence of mitral valve stenosis.  4. The aortic valve was not well visualized. No stenosis of the aortic valve.  EEG This is a technically difficult study due to the predominance of muscle and movement artifact.  Normal wakefulness and drowse are noted briefly.  There is some question of rare right frontal sharp waves with phase reversal at F8 that is difficult to distinguish from artifact.  Clinical correlation recommended.   PHYSICAL EXAM  Temp:  [97.5 F (36.4 C)-98 F (36.7 C)] 97.8 F (36.6 C) (05/21 0315) Pulse Rate:  [69-88] 88 (05/21 0315) Resp:  [18] 18 (05/21 0315) BP: (141-159)/(86-105) 151/91 (05/21 0315) SpO2:  [98 %-99 %] 99 % (05/21 0315)  General - Well nourished, well developed, mildly agitated.  Ophthalmologic - fundi not visualized due to noncooperation.  Cardiovascular - Irregular rhythm, but tele showed prominent sinus arrhythmia, not afib.  Neuro - limited exam as pt not cooperative.  Awake alert, orientated to place and self, but not to time or people. PERRL, able to track both sides, inconsistently blinking to visual threat, left gaze incomplete. Left facial droop and tongue protrusion not cooperative. Severe dysarthria and intangible.  Left UE 0/5 even with pain, RUE at least 4/5. RLE proximal 3-/5 and distal 3+/5. LLE proximal 0/5 and distal 3/5. Babinski negative bilaterally. Sensation, coordination not cooperative and gait not tested.   ASSESSMENT/PLAN Miguel [name] is a 68 y.o. male with history of TBI with hydrocephalus s/p VP shunt, seizures, alcohol use disorder, homelessness presenting with left-sided weakness following a fall.  No seizure activity.  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  Extensive R > L bifrontal encephalomalacia with multiple old ischemic infarcts.  Right parietal VP shunt with stable ventricular size and morphology.  CTA head & neck No LVO. B ICA atherosclerosis.  Fusiform dilatation of R M1.  Occlusion of proximal and mid the [place].  Proximal BA occluded.  Mild stenosis L PCA.    MRI right PLIC infarct - NSG Dr. Dutch Quint has been contacted to evaluate VPS post MRI today  2D Echo EF 60-65%  UDS positive for THC  HIV neg  LDL 136  HgbA1c 5.9  SCDs for VTE prophylaxis  No antithrombotic prior to admission (was on anticoagulation in the past, but has not seen a physician in greater than 1 year), now on ASA 81 and plavix 75 daily. Continue DAPT for 3 weeks and then ASA alone.   Therapy recommendations: SNF  Disposition: Pending  ?? Atrial Fibrillation ??  No cardiology note in the past  Most recent EKGs showed sinus arrhythmia vs. Ectopic rhythm, no clear afib. One EKG 06/03/18 read as afib but it was very artifactual.    EKG repeat pending  Pt certainly has high risk for developing or present of Afib  Now on DAPT, will continue on discharge at this  time.  Recommend to follow up with cardiology as outpt   Seizure    Hx of seizure in 02/2017  Loaded with Keppra followed by 500 IV twice daily   EEG question rare right frontal sharp waves with phase reversal vs. Artifact at F8  No clinical Sz  Continue keppra  Hx of stroke  02/2017 admission MRI showed 4mm RIGHT paramedian pons  Considered small vessel disease also  Hypertensive urgency  Blood pressure as high as 205/111   Home meds:  none  Normalizing  . Permissive hypertension (OK if < 220/120) but gradually normalize in 5-7 days . 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 Please call with questions. Pt will follow up with stroke clinic NP at [facility] in about 4 weeks. Thanks for the consult.   Marvel Plan, MD PhD Stroke Neurology 12/17/2018 10:37 AM   To contact Stroke Continuity provider, please refer to [email or web address]. After hours, contact General Neurology

## 2018-12-18 MED ORDER — LEVETIRACETAM 500 MG PO TABS: 500.0000 mg | ORAL_TABLET | Freq: Two times a day (BID) | ORAL | 0 refills | Status: DC

## 2018-12-18 MED ORDER — ATORVASTATIN CALCIUM 80 MG PO TABS: 80.0000 mg | ORAL_TABLET | Freq: Every day | ORAL | 2 refills | Status: DC

## 2018-12-18 MED ORDER — FOLIC ACID 1 MG PO TABS: 1.0000 mg | ORAL_TABLET | Freq: Every day | ORAL | 0 refills | Status: DC

## 2018-12-18 MED ORDER — CLOPIDOGREL BISULFATE 75 MG PO TABS: 75.0000 mg | ORAL_TABLET | Freq: Every day | ORAL | 0 refills | Status: DC

## 2018-12-18 MED ORDER — AMLODIPINE BESYLATE 5 MG PO TABS
5.0000 mg | ORAL_TABLET | Freq: Every day | ORAL | Status: DC
  Administered 2018-12-18 – 2018-12-20 (×2): 5 mg via ORAL
  Filled 2018-12-18 (×3): qty 1

## 2018-12-18 MED ORDER — ASPIRIN 81 MG PO TBEC: 81.0000 mg | DELAYED_RELEASE_TABLET | Freq: Every day | ORAL | 0 refills | Status: DC

## 2018-12-18 NOTE — Care Management (Addendum)
    Durable Medical Equipment  (From admission, onward)         Start     Ordered   12/18/18 1145  For home use only DME 3 n 1  Once     12/18/18 1144   12/18/18 1145  For home use only DME Other see comment  Once    Comments:  Michiel Sites lift   12/18/18 1144   12/18/18 1035  For home use only DME Wheelchair electric  Once     12/18/18 1034   12/18/18 1034  For home use only DME Hospital bed  Once    Question Answer Comment  Length of Need Lifetime   Bed type Semi-electric   Hoyer Lift Yes      12/18/18 1034         Pt will require hospital bed d/t dysphagia that causes frequent choking episodes. Pt needs to be elevated 30 degrees or more. A regular bed or wedge will not provide adequate elevation to prevent these issues.

## 2018-12-18 NOTE — Progress Notes (Signed)
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  Patient reportedly had onset of left-sided weakness last night associated with slurred speech. MRI currently pending but CT negative for acute changes.    OT comments  Pt requiring +2 mod assist for sit<>stand transfers and +2 max assist for stand pivot transfers (not advancing/pivoting feet without max cues/assist). Pt continues to demonstrate left side weakness and poor attention to left side.  Total Assist for positioning left UE when seated or in bed. Case manager reports pt plans to go home with family.  In OT evaluation, pt reported to be living in hotel.  Unsure that family can provide necessary level of assist for ADLs and functional transfers since pt is currently requiring +2 physical assist for basic transfers.  Pt would be good candidate for CIR or would recommend SNF if CIR cannot take him.  If pt and family are adamant about discharging to home, pt will need HHOT. Will continue to follow acutely.  Follow Up Recommendations  SNF;Supervision/Assistance - 24 hour;CIR    Equipment Recommendations  3 in 1 bedside commode;Hospital bed;Wheelchair (measurements OT);Wheelchair cushion (measurements OT)(hoyer lift)    Recommendations for Other Services Rehab consult    Precautions / Restrictions Precautions Precautions: Fall Restrictions Weight Bearing Restrictions: No       Mobility Bed Mobility Overal bed mobility: Needs Assistance Bed Mobility: Sit to Supine       Sit to supine: Mod assist;+2 for physical assistance   General bed mobility comments: assist to guide trunk and bring bilat LE into  bed  Transfers Overall transfer level: Needs assistance Equipment used: 2 person hand held assist Transfers: Sit to/from Stand;Stand Pivot Transfers Sit to Stand: +2 physical assistance;Mod assist Stand pivot transfers: Max assist;+2 physical assistance       General transfer comment: assist  to power up into standing; no L knee buckiling but assistance required for L LE mobility in standing; pt with poor motor planning and requires assistance on R side for sequencing; pt transfered chair to bed, bed to BSC, and BSC to EOB during session; each pivot pt required more assistance     Balance Overall balance assessment: Needs assistance Sitting-balance support: No upper extremity supported;Feet supported Sitting balance-Leahy Scale: Fair Sitting balance - Comments: min guard to min assist for safety statically   Standing balance support: During functional activity;Bilateral upper extremity supported Standing balance-Leahy Scale: Poor Standing balance comment: L lateral lean and pushing, reliant on UE and external support                           ADL either performed or assessed with clinical judgement   ADL Overall ADL's : Needs assistance/impaired     Grooming: Minimal assistance;Standing Grooming Details (indicate cue type and reason): Cues and intermittent assist to wipe mouth/nose             Lower Body Dressing: Total assistance;+2 for physical assistance;Sit to/from stand   Toilet Transfer: +2 for physical assistance;Maximal assistance;Stand-pivot;BSC   Toileting- Clothing Manipulation and Hygiene: +2 for physical assistance;Total assistance       Functional mobility during ADLs: +2 for physical assistance;Maximal assistance General ADL Comments: Pt  transferred from recliner to bed. While sitting EOB, he reported need to have BM on BSC. Transferred from bed to 3n1 and then back to bed. Pt unsuccessful with having BM.     Vision       Perception      Praxis      Cognition Arousal/Alertness: Awake/alert Behavior During Therapy: Flat affect Overall Cognitive Status: Difficult to assess Area of Impairment: Following commands;Safety/judgement;Awareness;Problem solving                       Following Commands: Follows one step commands inconsistently;Follows one step commands with increased time Safety/Judgement: Decreased awareness of safety;Decreased awareness of deficits Awareness: Intellectual Problem Solving: Decreased initiation;Difficulty sequencing;Requires verbal cues;Requires tactile cues;Slow processing General Comments: one word answers but mostly appropriate responses        Exercises     Shoulder Instructions       General Comments      Pertinent Vitals/ Pain       Pain Assessment: Faces Faces Pain Scale: No hurt Pain Intervention(s): Monitored during session  Home Living                                          Prior Functioning/Environment              Frequency  Min 3X/week        Progress Toward Goals  OT Goals(current goals can now be found in the care plan section)  Progress towards OT goals: Progressing toward goals  Acute Rehab OT Goals Patient Stated Goal: none stated Time For Goal Achievement: 12/30/18 Potential to Achieve Goals: Good ADL Goals Pt Will Perform Grooming: with set-up;sitting Pt Will Perform Upper Body Bathing: with min assist;sitting Pt Will Transfer to Toilet: with min assist;bedside commode;stand pivot transfer Pt Will Perform Toileting - Clothing Manipulation and hygiene: with min assist;sit to/from stand Pt/caregiver will Perform Home Exercise Program: Increased ROM;Left upper extremity;With written HEP provided Additional ADL Goal #1: Pt will follow 1 step commands with 90% accuracy in order to optimize participation in ADLs.  Plan Discharge plan needs to be updated;Frequency remains appropriate    Co-evaluation    PT/OT/SLP  Co-Evaluation/Treatment: Yes Reason for Co-Treatment: For patient/therapist safety;To address functional/ADL transfers PT goals addressed during session: Mobility/safety with mobility OT goals addressed during session: ADL's and self-care      AM-PAC OT "6 Clicks" Daily Activity     Outcome Measure   Help from another person eating meals?: Total Help from another person taking care of personal grooming?: A Lot Help from another person toileting, which includes using toliet, bedpan, or urinal?: Total Help from another person bathing (including washing, rinsing, drying)?: A Lot Help from another person to put on and taking off regular upper body clothing?: A Lot Help from another person to put on and taking off regular lower body clothing?: Total 6 Click Score: 9    End of Session Equipment Utilized During Treatment: Gait belt  OT Visit Diagnosis: Other abnormalities of gait and mobility (R26.89);Muscle weakness (generalized) (M62.81);Hemiplegia and hemiparesis;Other symptoms and signs involving cognitive function;Cognitive communication deficit (R41.841) Hemiplegia - Right/Left: Left Hemiplegia - dominant/non-dominant: Dominant   Activity Tolerance Patient tolerated treatment well   Patient Left in bed;with call bell/phone within reach;with bed alarm set   Nurse Communication Mobility status        Time: 1240-1315 OT  Time Calculation (min): 35 min  Charges: OT General Charges $OT Visit: 1 Visit OT Treatments $Self Care/Home Management : 8-22 mins     Cipriano MileJohnson,  Elizabeth OTR/L Acute Rehab Services 12/18/2018, 5:02 PM

## 2018-12-18 NOTE — Progress Notes (Signed)
  Speech Language Pathology Treatment: Dysphagia  Patient Details Name: Miguel [name] MRN: [id number] DOB: 1950-08-19 Today's Date: 12/18/2018 Time: 6812-7517 SLP Time Calculation (min) (ACUTE ONLY): 26 min  Assessment / Plan / Recommendation Clinical Impression  Pt with sleepy this afternoon and noted consistent periods of apnea during the entire session.  Pt has 6-7 respirations per minute, he ceases breathing for approximately 24 seconds and then demonstrates rapid deep breathing - transitioning to shallow breathing for approximately 36 seconds.  This did not cease during the entire session. SLP made RN aware and she obtained a set of vitals, all vitals stable.  This Cheynne-Stokes respiratory pattern may significantly negatively impact his ability to maintain po.  Do not see documentation of this in prior notes but ? If near baseline given his COPD hx.    Pt provided with intake of puree/nectar thick liquids *magic cup, pureed berries, nectar tea*.  He demonstrated delayed audible swallow and occasional wet voice but no overt indication of aspiration.  Pt encouraged to cough/clear his throat when voice is wet but he does not consistently perform and is very deconditioned thus his performance is weak.    Intake is listed as 25% and all vital stable.  Educated pt to aspiration precautions and updated swallow precaution signs.  Pt benefits from use of oral suction to clear retained secretions - recommend consider home oral suction or at least supply of toothettes to help pt clear secretions *of which he is not aware.    In the future, it would be beneficial for pt to have a palliative consult given his multiple comorbidities and ETOH/tobacco use.       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  Patient reportedly had  onset of left-sided weakness last night associated with slurred speech. MRI currently pending but CT negative for acute changes.       SLP Plan  Continue with current plan of care       Recommendations  Diet recommendations: Dysphagia 1 (puree);Nectar-thick liquid(CREAMY PUREE - ) Liquids provided via: Cup;Teaspoon Medication Administration: Crushed with puree Supervision: Full supervision/cueing for compensatory strategies Compensations: Minimize environmental distractions;Slow rate;Small sips/bites;Other (Comment)(assure pt swallows before giving more) Postural Changes and/or Swallow Maneuvers: Seated upright 90 degrees;Upright 30-60 min after meal                Oral Care Recommendations: Oral care BID Follow up Recommendations: Home health SLP SLP Visit Diagnosis: Dysphagia, oropharyngeal phase (R13.12) Plan: Continue with current plan of care       GO                Miguel [name] 12/18/2018, 4:47 PM  Miguel Burnet, MS [facility] SLP Acute Rehab Services Pager [phone] Office [phone]

## 2018-12-18 NOTE — Progress Notes (Signed)
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 Hepresenedto the ED on 5/19with left-sided weakness. Patient reportedly had left-sided weakness thenightpriorassociated with slurred speech.  Patient apparently had a fall whiletrying to get to the bathroom which resulted in him urinating on himself. There was no witnessed seizure-like activity at that time.  He has apparently not been on any of his medications for a year.  In the ED,BPwas elevated to205/111.  Subjective: Patient was seen and examinedthis morning. Lethargic. Struggling withsecretions. Speech therapy consult appreciated. Had MBS, dysphagia 2 diet started.  Assessment/Plan:  Principal Problem:   Left-sided weakness Active Problems:   Hypertension   Alcohol abuse   Smoker   S/P ventriculoperitoneal shunt   Seizure disorder (HCC)   Atrial fibrillation (HCC)   Cerebral embolism with cerebral infarction   Cerebrovascular accident (CVA) due to thrombosis of right middle cerebral artery (HCC)   Sinus arrhythmia  Stroke:Right PLICinfarct more consistent with small vessel disease -Imagings as above. -LDL 136, target less than 70. -Hemoglobin A1c 5.9 -Echocardiogram EF 60 to 65%, no PFO. -Patient was supposed to be on Xarelto at home, questionable compliance.  Apparently he had not seen a physician in more than a year. - Neurology consult appreciated.  Patient has been started on aspirin 81 mg daily and Plavix 75 mg daily.  Continue DAPT for 3 weeks then aspirin alone.   -  Not on a statin at home.  Lipitor 80 mg daily started. -PT/OT/ST eval obtained.  Patient had significant weakness on the left side along  with dysphagia. - Dysphagia 2 diet per speech therapist recommendation. SNF recommended by PT. -Patient's daughter does not want him to be placed in a facility because she is afraid he would catch coronavirus.  She wants to take him home with home health.  Home health and DME equipments ordered.  History of seizure disorder: -Patient apparently has not been taking home Keppra. -EEG normal. -Continue Keppra at home.  ?? Atrial fibrillation: -It is unclear at this time patient has history of A. fib in the past.  He however has risk factors for development of A. fib.  Patient apparently was on Xarelto at home but noncompliant.   -Telemetry monitoring in the hospital shows frequent sinus arrhythmia.  But no A. fib. - At this time on dual antiplatelet agents.  Recommend to follow-up with cardiology as an outpatient.  History ofTBI status VP shunt: VP shunt catheter in place with stable ventricular size and morphology per CT head. MRI was obtained after clearance from neurosurgery.  Neurosurgery evaluated and programmed VP shunt after the MRI.  Hypertensive urgency Blood pressure was elevated to 205/111 on admission.  Not on home medicines.   Allowed permissive hypertension for 24 to 48 hours.   Blood pressure has remained high in 150s and 160s.  We will start him on amlodipine 5 mg daily.  Alcohol use: History of chronic alcohol abuse.  Reported last drink 2 days prior to admission.  In the hospital, he was placed on CIWA protocol.  No withdrawal symptoms. Continue folic acid vitamin B1 and multivitamins.  Counseled to quit alcohol.  Tobacco use: -Nicotine patch.  Counseled to quit  Body mass index is 21.41 kg/m. Mobility: PT eval obtained. Diet: Cardiac diet DVT  prophylaxis:  SCDs Code Status:   Code Status: Full Code  Family Communication:  Disposition Plan:  Home with home health today versus tomorrow  Consultants:  Neurology  Procedures:    Antimicrobials:   Anti-infectives (From admission, onward)   None      Infusions:  . sodium chloride Stopped (12/16/18 2236)    Scheduled Meds: . aspirin EC  81 mg Oral Daily  . atorvastatin  80 mg Oral q1800  . clopidogrel  75 mg Oral Daily  . folic acid  1 mg Oral Daily  . levETIRAcetam  500 mg Oral BID  . multivitamin with minerals  1 tablet Oral Daily  . nicotine  21 mg Transdermal Daily  . sodium chloride flush  10-40 mL Intracatheter Q12H  . sodium chloride flush  3 mL Intravenous Once  . thiamine  100 mg Oral Daily    PRN meds: sodium chloride, acetaminophen **OR** acetaminophen (TYLENOL) oral liquid 160 mg/5 mL **OR** acetaminophen, labetalol, LORazepam **OR** LORazepam, senna-docusate, sodium chloride flush   Objective: Vitals:   12/18/18 0737 12/18/18 1124  BP: (!) 163/80 (!) 152/69  Pulse: 77 (!) 59  Resp: 20 16  Temp: 98.8 F (37.1 C) (!) 96.9 F (36.1 C)  SpO2: 100% 100%    Intake/Output Summary (Last 24 hours) at 12/18/2018 1205 Last data filed at 12/18/2018 0951 Gross per 24 hour  Intake 480 ml  Output 400 ml  Net 80 ml   Filed Weights   12/15/18 1648 12/15/18 2007  Weight: 70.3 kg 71.6 kg   Weight change:  Body mass index is 21.41 kg/m.   Physical Exam: General exam: Slumped in bed, continues to have left-sided weakness Skin: No rashes, lesions or ulcers. HEENT: Atraumatic, normocephalic, supple neck, no obvious bleeding Lungs: Transmitted sounds from throat, no crackles CVS: First and second heart sound heard, no murmur GI/Abd soft, nontender, nondistended, bowel present CNS: Alert, awake, dysarthric, left hemiparesis present Psychiatry: Depressed mood Extremities: No pedal edema, no calf tenderness  Data Review: I have personally reviewed the laboratory data and studies available.  Recent Labs  Lab 12/15/18 1612 12/15/18 1618  WBC 7.3  --   NEUTROABS 3.6  --   HGB 14.3 17.0  HCT 44.4 50.0  MCV 93.5  --   PLT 162  --    Recent Labs  Lab  12/15/18 1612 12/15/18 1618  NA 137 137  K 3.9 4.9  CL 105 111  CO2 20*  --   GLUCOSE 95 96  BUN 11 17  CREATININE 1.04 0.70  CALCIUM 8.6*  --    Lipid Panel     Component Value Date/Time   CHOL 191 12/16/2018 0406   TRIG 67 12/16/2018 0406   HDL 42 12/16/2018 0406   CHOLHDL 4.5 12/16/2018 0406   VLDL 13 12/16/2018 0406   LDLCALC 136 (H) 12/16/2018 0406   Lab Results  Component Value Date   HGBA1C 5.9 (H) 12/16/2018   Lorin Glass, MD  [facility] 12/18/2018

## 2018-12-18 NOTE — Plan of Care (Signed)
Patient stable, discussed POC with patient and daughter, agreeable with plan, denies question/concerns at this time.  

## 2018-12-18 NOTE — Progress Notes (Addendum)
Patient suffers from CVA with left side hemiplegia which impairs their ability to perform daily activities like ambulate in the home.  A walker alone will not resolve the issues with performing activities of daily living. A high strength light weight manual wheelchair will allow patient to safely perform daily activities.  The patient can self propel in the home or has a caregiver who can provide assistance.     Erline Levine, PTA Acute Rehabilitation Services Pager: [phone] Office: [phone]

## 2018-12-18 NOTE — Discharge Summary (Signed)
Physician Discharge Summary  [name]:[id number] DOB: 1951-02-27 DOA: 12/15/2018  PCP: [facility]  Admit date: 12/15/2018 Discharge date: 12/20/2018  Admitted From: Home Discharge disposition: Home with home health   Code Status: Full Code   Recommendations for Outpatient Follow-Up:   1. Follow-up with neurology as an outpatient 2. 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 Hepresenedto the ED on 5/19with left-sided weakness. Patient reportedly had left-sided weakness thenightpriorassociated with slurred speech.  Patient apparently had a fall whiletrying to get to the bathroom which resulted in him urinating on himself. There was no witnessed seizure-like activity at that time.  He has apparently not been on any of his medications for a year.  In the ED,BPwas elevated to205/111.  Imagings: CT-scan of the brain: No hemorrhage; VP shunt stable in position, encephalomalacia in right frontal lobe and right temporal lobe, stable remote left frontal infarct; stable chronic lacunar infarcts.  CTA head/neck: no LVO; bil vertebral occlusion with reconstitution to PICA bilaterally; proximal basilar occlusion. Bilateral atherosclerosis without stenosis in carotids.  MRI brain: 1. 14 mm acute ischemic nonhemorrhagic infarct involving the  posterior limb of the right internal capsule. 2. Sequelae of prior traumatic brain injury with extensive bifrontal and temporal lobe encephalomalacia, stable from previous.  3. Right parietal approach VP shunt catheter in place with tip terminating at the septum pellucidum. Ventriculomegaly stable from previous.  4. Underlying advanced cerebral atrophy with chronic microvascular ischemic disease.    EEG - normal.   Hospital Course:  Stroke:Right PLICinfarct more consistent with small vessel disease -Imagings as above. -LDL 136, target less than 70. -Hemoglobin A1c 5.9 -Echocardiogram EF 60 to 65%, no PFO. -Patient was supposed to be on Xarelto at home, questionable compliance.  Apparently he had not seen a physician in more than a year. - Neurology consult appreciated.  Patient has been started on aspirin 81 mg daily and Plavix 75 mg daily.  Continue DAPT for 3 weeks then aspirin alone.   -  Not on a statin at home.  Lipitor 80 mg daily started. -PT/OT/ST eval obtained.  Patient had significant weakness on the left side along with dysphagia. - Dysphagia 2 diet per speech therapist recommendation. SNF recommended by PT. -Patient's daughter does not want him to be placed in a facility because she is afraid he would catch coronavirus.  She wants to take him home with home health.  Home health and DME equipments ordered. -I discussed with patient's daughter yesterday.  She stated that recommends will be delivered today by 2 PM.  Dysphagia/high risk of aspiration -Patient has significant dysphagia.  He is also very weak and has reduced ability to clear secretions.    Speech therapy consult was obtained.  Patient is on dysphagia 2 diet.  He also needs frequent suctioning.    SNF was recommended.  However, family wants to discharge the patient home because of concerns regarding COVID-19 in  the facility.  He is at high risk of readmission from aspiration or respiratory failure.  We have  arranged for suction device at home through home care.  History of seizure disorder: -Patient apparently has not been taking home Keppra. -EEG normal. -Continue Keppra at home.  Unclear history of atrial fibrillation: -It is unclear at this time patient has history of A. fib in the past.  He however has risk factors for development of A. fib.  Patient apparently was on Xarelto at home but noncompliant for more than a year. -Telemetry monitoring in the hospital showed frequent sinus arrhythmia.  But no A. fib. - At this time, patient is on dual antiplatelet agents.  Recommend to follow-up with cardiology as an outpatient.  I discussed the case with neurologist Dr. Roda Shutters.  Patient is at high risk of bleeding from concomitant anticoagulation as well.  So we decided to stop Xarelto.  Patient was not taking it for last 1 year anyway.  History ofTBI status VP shunt: VP shunt catheter in place with stable ventricular size and morphology per CT head. MRI was obtained after clearance from neurosurgery.  Neurosurgery evaluated and programmed VP shunt after the MRI.  Hypertensive urgency Blood pressure was elevated to 205/111 on admission.  Not on home medicines.   Allowed permissive hypertension for 24 to 48 hours.   Blood pressure has remained high in 150s and 160s.   We started the patient on glipizide 5 mg daily.  Blood pressure is now mostly in normal range.  Continue the same dose of amlodipine for now.  Needs outpatient follow-up for dose adjustment based on blood pressure response.  Alcohol use: History of chronic alcohol abuse.  Reported last drink 2 days prior to admission.  In the hospital, he was placed on CIWA protocol.  No withdrawal symptoms. Continue folic acid vitamin B1 and multivitamins.  Counseled to quit alcohol.  Tobacco use: -Nicotine patch.  Counseled to quit  Patient is for discharge to home today with home health.  Subjective:  Patient was seen and examined this  morning.  Lethargic. Struggling with secretions.  Speech therapy consult appreciated. Had MBS, dysphagia 2 diet started.  Discharge Exam:   Vitals:   12/19/18 1931 12/19/18 2343 12/20/18 0317 12/20/18 0729  BP: 122/88 129/76 119/82 (!) 148/86  Pulse: 77 82 80 88  Resp: 20 18 18 18   Temp: 98.5 F (36.9 C) 98.3 F (36.8 C) 98.1 F (36.7 C) 98.3 F (36.8 C)  TempSrc: Oral Oral Oral Axillary  SpO2: 99% 99% 98% 97%  Weight:      Height:        Body mass index is 21.41 kg/m.  General exam: Slumped in bed, continues to have left-sided weakness Skin: No rashes, lesions or ulcers. HEENT: Atraumatic, normocephalic, supple neck, no obvious bleeding Lungs: Transmitted sounds from throat, no crackles CVS: First and second heart sound heard, no murmur GI/Abd soft, nontender, nondistended, bowel present CNS: Alert, awake, dysarthric, left hemiparesis present Psychiatry: Depressed mood Extremities: No pedal edema, no calf tenderness  Discharge Instructions:  Wound care: None Discharge Instructions    Ambulatory referral to Neurology   Complete by:  As directed    Follow up with Dr. Pearlean Brownie at [facility] in 4-6 weeks. Too complicated for NP to follow. Thanks.   Call MD for:  difficulty breathing, headache or visual disturbances   Complete by:  As directed    Diet - low sodium heart healthy   Complete by:  As directed  Increase activity slowly   Complete by:  As directed      Follow-up Information    Micki Riley, MD. Schedule an appointment as soon as possible for a visit in 4 week(s).   Specialties:  Neurology, Radiology Contact information: [address] [place] [address] [phone]          Allergies as of 12/20/2018   No Known Allergies     Medication List    STOP taking these medications   hydrALAZINE 25 MG tablet Commonly known as:  APRESOLINE   rivaroxaban 20 MG Tabs tablet Commonly known as:  XARELTO     TAKE these medications     amLODipine 5 MG tablet Commonly known as:  NORVASC Take 1 tablet (5 mg total) by mouth daily. Start taking on:  Dec 21, 2018   aspirin 81 MG EC tablet Take 1 tablet (81 mg total) by mouth daily.   atorvastatin 80 MG tablet Commonly known as:  LIPITOR Take 1 tablet (80 mg total) by mouth daily at 6 PM. What changed:    medication strength  how much to take   clopidogrel 75 MG tablet Commonly known as:  PLAVIX Take 1 tablet (75 mg total) by mouth daily for 21 days.   feeding supplement (ENSURE ENLIVE) Liqd Take 237 mLs by mouth 2 (two) times daily between meals.   folic acid 1 MG tablet Commonly known as:  FOLVITE Take 1 tablet (1 mg total) by mouth daily.   levETIRAcetam 500 MG tablet Commonly known as:  KEPPRA Take 1 tablet (500 mg total) by mouth 2 (two) times daily.   multivitamin with minerals Tabs tablet Take 1 tablet by mouth daily.   thiamine 100 MG tablet Take 1 tablet (100 mg total) by mouth daily.            Durable Medical Equipment  (From admission, onward)         Start     Ordered   12/18/18 1806  For home use only DME Suction  Once    Comments:  For lifetime need  Question:  Suction  Answer:  Oral   12/18/18 1806   12/18/18 1405  For home use only DME 3 n 1  Once     12/18/18 1404   12/18/18 1151  For home use only DME high strength lightweight manual wheelchair with seat cushion  Once    Comments:  Patient suffers from stroke which impairs their ability to perform daily activities like bathing, dressing, feeding, grooming and toileting in the home.  A cane, crutch or walker will not resolve  issue with performing activities of daily living. A wheelchair will allow patient to safely perform daily activities.Length of need 6 months . (THEN ONE OF THESE TWO:) Patient self-propels the wheelchair while engaging in frequent activities such as laundry, meals and toileting which cannot be performed in a standard or lightweight wheelchair due to the  weight of the chair. Accessories: elevating leg rests (ELRs), wheel locks, extensions and anti-tippers.   12/18/18 1151   12/18/18 1145  For home use only DME 3 n 1  Once     12/18/18 1144   12/18/18 1145  For home use only DME Other see comment  Once    Comments:  Michiel Sites lift   12/18/18 1144   12/18/18 1034  For home use only DME Hospital bed  Once    Question Answer Comment  Length of Need Lifetime   Bed  type Semi-electric   Hoyer Lift Yes      12/18/18 1034          Time coordinating discharge: 45 minutes  The results of significant diagnostics from this hospitalization (including imaging, microbiology, ancillary and laboratory) are listed below for reference.    Procedures and Diagnostic Studies:   Ct Angio Head W Or Wo Contrast  Result Date: 12/15/2018 CLINICAL DATA:  Code stroke.  Left-sided weakness. EXAM: CT ANGIOGRAPHY HEAD AND NECK TECHNIQUE: Multidetector CT imaging of the head and neck was performed using the standard protocol during bolus administration of intravenous contrast. Multiplanar CT image reconstructions and MIPs were obtained to evaluate the vascular anatomy. Carotid stenosis measurements (when applicable) are obtained utilizing NASCET criteria, using the distal internal carotid diameter as the denominator. CONTRAST:  75mL OMNIPAQUE IOHEXOL 350 MG/ML SOLN COMPARISON:  CT head 12/15/2018 FINDINGS: CTA NECK FINDINGS Aortic arch: Mild atherosclerotic disease in the aortic arch and proximal great vessels. Normal arch branching pattern. Proximal great vessels patent without significant stenosis. Right carotid system: Mild atherosclerotic calcification right carotid bulb without significant stenosis Left carotid system: Mild atherosclerotic disease left carotid bifurcation without significant stenosis. 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 Reconstitution of a very small right vertebral artery at the C3 level which supplies small PICA.  Occlusion of the proximal left vertebral artery. Reconstitution of a very small distal left vertebral artery at the C2 level which supplies a small left PICA. Skeleton: Posterior cerclage and bony fusion C3 through C5. No acute skeletal abnormality. Other neck: Negative for mass or adenopathy Upper chest: Moderate to severe apically emphysema bilaterally. Review of the MIP images confirms the above findings CTA HEAD FINDINGS Anterior circulation: Atherosclerotic calcification in the cavernous carotid bilaterally with mild stenosis bilaterally. Mild fusiform enlargement of the right M1 segment. No significant stenosis but mild irregularity suggesting atherosclerotic disease. Right middle cerebral artery branches patent. Both anterior cerebral arteries patent. Small caliber left M1 segment. No significant left MCA stenosis. Posterior circulation: Fetal origin of the left posterior cerebral artery appears to supply the basilar. Basilar is very small and is occluded proximally. Small right posterior communicating artery. Both posterior cerebral arteries are patent with mild irregularity and mild stenosis on the left. PICA appears supplied from very small vertebral arteries bilaterally. Venous sinuses: Faint venous enhancement due to arterial phase scanning Anatomic variants: None Review of the MIP images confirms the above findings IMPRESSION: 1. Atherosclerotic disease in the carotid bifurcation bilaterally without significant stenosis. Mild stenosis in the cavernous carotid bilaterally. Atherosclerotic fusiform dilatation right M1 segment. 2. 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 Reconstitution of tiny distal vertebral artery bilaterally with supplies PICA bilaterally. Proximal basilar is occluded. Mid distal basilar supplied from the posterior communicating arteries bilaterally. Mild stenosis left PCA. 3. Negative for emergent large vessel occlusion Electronically Signed   By: Marlan Palau  M.D.   On: 12/15/2018 16:53   Ct Angio Neck W Or Wo Contrast  Result Date: 12/15/2018 CLINICAL DATA:  Code stroke.  Left-sided weakness. EXAM: CT ANGIOGRAPHY HEAD AND NECK TECHNIQUE: Multidetector CT imaging of the head and neck was performed using the standard protocol during bolus administration of intravenous contrast. Multiplanar CT image reconstructions and MIPs were obtained to evaluate the vascular anatomy. Carotid stenosis measurements (when applicable) are obtained utilizing NASCET criteria, using the distal internal carotid diameter as the denominator. CONTRAST:  75mL OMNIPAQUE IOHEXOL 350 MG/ML SOLN COMPARISON:  CT head 12/15/2018 FINDINGS: CTA NECK FINDINGS  Aortic arch: Mild atherosclerotic disease in the aortic arch and proximal great vessels. Normal arch branching pattern. Proximal great vessels patent without significant stenosis. Right carotid system: Mild atherosclerotic calcification right carotid bulb without significant stenosis Left carotid system: Mild atherosclerotic disease left carotid bifurcation without significant stenosis. 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 Reconstitution of a very small right vertebral artery at the C3 level which supplies small PICA. Occlusion of the proximal left vertebral artery. Reconstitution of a very small distal left vertebral artery at the C2 level which supplies a small left PICA. Skeleton: Posterior cerclage and bony fusion C3 through C5. No acute skeletal abnormality. Other neck: Negative for mass or adenopathy Upper chest: Moderate to severe apically emphysema bilaterally. Review of the MIP images confirms the above findings CTA HEAD FINDINGS Anterior circulation: Atherosclerotic calcification in the cavernous carotid bilaterally with mild stenosis bilaterally. Mild fusiform enlargement of the right M1 segment. No significant stenosis but mild irregularity suggesting atherosclerotic disease. Right middle cerebral  artery branches patent. Both anterior cerebral arteries patent. Small caliber left M1 segment. No significant left MCA stenosis. Posterior circulation: Fetal origin of the left posterior cerebral artery appears to supply the basilar. Basilar is very small and is occluded proximally. Small right posterior communicating artery. Both posterior cerebral arteries are patent with mild irregularity and mild stenosis on the left. PICA appears supplied from very small vertebral arteries bilaterally. Venous sinuses: Faint venous enhancement due to arterial phase scanning Anatomic variants: None Review of the MIP images confirms the above findings IMPRESSION: 1. Atherosclerotic disease in the carotid bifurcation bilaterally without significant stenosis. Mild stenosis in the cavernous carotid bilaterally. Atherosclerotic fusiform dilatation right M1 segment. 2. 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 Reconstitution of tiny distal vertebral artery bilaterally with supplies PICA bilaterally. Proximal basilar is occluded. Mid distal basilar supplied from the posterior communicating arteries bilaterally. Mild stenosis left PCA. 3. Negative for emergent large vessel occlusion Electronically Signed   By: Marlan Palau M.D.   On: 12/15/2018 16:53   Mr Brain Wo Contrast  Result Date: 12/16/2018 CLINICAL DATA:  Initial evaluation for acute focal neural deficit, left-sided weakness. History of TBI, stroke, hydrocephalus, status post VP shunt. EXAM: MRI HEAD WITHOUT CONTRAST TECHNIQUE: Multiplanar, multiecho pulse sequences of the brain and surrounding structures were obtained without intravenous contrast. COMPARISON:  Prior CT and CTA from 12/15/2018 as well as previous MRI from 03/18/2017. FINDINGS: Brain: Moderately advanced cerebral atrophy for age. Underlying mild to moderate chronic microvascular ischemic changes present within the periventricular white matter. Superimposed few scattered remote lacunar  infarcts, most notable at the left thalamus. Extensive encephalomalacia and gliosis seen involving the anterior frontal and temporal lobes bilaterally, most likely related to traumatic brain injury. 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 Focal dural thickening and/or chronic extra-axial hygroma overlying the bilateral frontal convexities measure up to 3 mm on the right and 7 mm on the left (series 4, image 16), stable from previous. Associated ex vacuo dilatation of the frontal horns of both lateral ventricles, right greater than left. Right parietal approach VP shunt catheter in place with tip terminating at the septum pellucidum. Susceptibility artifact about the shunt reservoir at the right occipital scalp. Diffuse ventricular prominence relatively stable from previous. No mass lesion or new midline shift. 14 mm focus of restricted diffusion seen involving the posterior limb of the right internal capsule, compatible with acute ischemic infarct (series 3, image 25). No associated hemorrhage or mass effect. No other  evidence for acute or subacute ischemia. Gray-white matter differentiation otherwise maintained. Vascular: Major intracranial vascular flow voids grossly maintained at the skull base. Diminutive vertebrobasilar system noted. Skull and upper cervical spine: Craniocervical junction within normal limits. Upper cervical spine normal. Bone marrow signal intensity within normal limits. No acute scalp soft tissue abnormality. Susceptibility artifact from shunt reservoir or at the right occipital scalp. Sinuses/Orbits: No acute abnormality about the globes orbital soft tissues. Scattered chronic mucosal thickening throughout the paranasal sinuses, most notable at the right frontal sinus and ethmoidal air cells. No air-fluid level to suggest acute sinusitis. Small left mastoid effusion noted, of doubtful significance. Other: None. IMPRESSION: 1. 14 mm acute ischemic nonhemorrhagic infarct involving  the posterior limb of the right internal capsule. 2. Sequelae of prior traumatic brain injury with extensive bifrontal and temporal lobe encephalomalacia, stable from previous. 3. Right parietal approach VP shunt catheter in place with tip terminating at the septum pellucidum. Ventriculomegaly stable from previous. 4. Underlying advanced cerebral atrophy with chronic microvascular ischemic disease. Electronically Signed   By: Rise Mu M.D.   On: 12/16/2018 18:54   Dg Chest Portable 1 View  Result Date: 12/15/2018 CLINICAL DATA:  Code stroke. EXAM: PORTABLE CHEST 1 VIEW COMPARISON:  03/19/2017 FINDINGS: The lungs are clear without focal pneumonia, edema, pneumothorax or pleural effusion. The cardiopericardial silhouette is within normal limits for size. The visualized bony structures of the thorax are intact. Telemetry leads overlie the chest. Shunt tubing over the right hemithorax again noted. IMPRESSION: No active disease. Electronically Signed   By: Kennith Center M.D.   On: 12/15/2018 18:22   Ct Head Code Stroke Wo Contrast  Result Date: 12/15/2018 CLINICAL DATA:  Code stroke. Initial evaluation for acute left-sided deficits. EXAM: CT HEAD WITHOUT CONTRAST TECHNIQUE: Contiguous axial images were obtained from the base of the skull through the vertex without intravenous contrast. COMPARISON:  Prior CT from 06/17/2018. FINDINGS: Brain: Advanced age-related cerebral atrophy with moderate chronic microvascular ischemic disease. Superimposed extensive bifrontal encephalomalacia, right greater than left with superimposed right frontal dystrophic calcification, stable. Associated ex vacuo dilatation of the frontal horns of both lateral ventricles, also greater on the right. Right parietal approach shunt catheter in place with tip terminating at the septum pellucidum, stable. Ventricular size and morphology unchanged. Left temporal lobe encephalomalacia of compatible with chronic ischemia, stable. Few  scattered chronic lacunar infarcts noted involving the right basal ganglia and left thalamus. Small remote right cerebellar infarct. No acute intracranial hemorrhage. No acute large vessel territory infarct. No mass lesion, midline shift or mass effect. No hydrocephalus. No extra-axial fluid collection. Vascular: No asymmetric hyperdense vessel. Scattered vascular calcifications noted within the carotid siphons. Skull: Right parietal approach VP shunt catheter. Scalp soft tissues demonstrate no acute finding. Calvarium otherwise intact. Sinuses/Orbits: Globes and orbital soft tissues demonstrate no acute finding. Chronic mucosal thickening seen throughout the paranasal sinuses. Mastoids are clear. Other: None. ASPECTS [facility] Stroke Program Early CT Score) - Ganglionic level infarction (caudate, lentiform nuclei, internal capsule, insula, M1-M3 cortex): 7 - Supraganglionic infarction (M4-M6 cortex): 3 Total score (0-10 with 10 being normal): 10 IMPRESSION: 1. No acute intracranial infarct or other abnormality. 2. ASPECTS is 10. 3. Extensive right greater than left bifrontal encephalomalacia with multiple remote ischemic infarcts as above. 4. Right parietal approach VP shunt catheter in place with stable ventricular size and morphology. 5. Underlying cerebral atrophy with chronic microvascular ischemic disease. These results were communicated to [place] at 4:39 pmon 5/19/2020by text page via the [facility]  messaging system. Electronically Signed   By: Rise Mu M.D.   On: 12/15/2018 16:45     Labs:   Basic Metabolic Panel: Recent Labs  Lab 12/15/18 1612 12/15/18 1618 12/19/18 0915  NA 137 137 138  K 3.9 4.9 3.8  CL 105 111 108  CO2 20*  --  20*  GLUCOSE 95 96 109*  BUN CREATININE 1.04 0.70 0.87  CALCIUM 8.6*  --  9.1  MG  --   --  1.9  PHOS  --   --  3.6   GFR Estimated Creatinine Clearance: 83.4 mL/min (by C-G formula based on SCr of 0.87 mg/dL). Liver Function Tests: Recent  Labs  Lab 12/15/18 1612  AST 18  ALT 19  ALKPHOS 42  BILITOT 1.2  PROT 6.4*  ALBUMIN 3.3*   No results for input(s): LIPASE, AMYLASE in the last 168 hours. No results for input(s): AMMONIA in the last 168 hours. Coagulation profile Recent Labs  Lab 12/15/18 1612  INR 1.1    CBC: Recent Labs  Lab 12/15/18 1612 12/15/18 1618 12/19/18 0915  WBC 7.3  --  8.7  NEUTROABS 3.6  --  4.7  HGB 14.3 17.0 16.0  HCT 44.4 50.0 47.8  MCV 93.5  --  89.7  PLT 162  --  174   Cardiac Enzymes: No results for input(s): CKTOTAL, CKMB, CKMBINDEX, TROPONINI in the last 168 hours. BNP: Invalid input(s): POCBNP CBG: No results for input(s): GLUCAP in the last 168 hours. D-Dimer No results for input(s): DDIMER in the last 72 hours. Hgb A1c No results for input(s): HGBA1C in the last 72 hours. Lipid Profile No results for input(s): CHOL, HDL, LDLCALC, TRIG, CHOLHDL, LDLDIRECT in the last 72 hours. Thyroid function studies No results for input(s): TSH, T4TOTAL, T3FREE, THYROIDAB in the last 72 hours.  Invalid input(s): FREET3 Anemia work up No results for input(s): VITAMINB12, FOLATE, FERRITIN, TIBC, IRON, RETICCTPCT in the last 72 hours. Microbiology Recent Results (from the past 240 hour(s))  SARS Coronavirus 2 (CEPHEID - Performed in [facility] hospital lab), Hosp Order     Status: None   Collection Time: 12/15/18  4:49 PM  Result Value Ref Range Status   SARS Coronavirus 2 NEGATIVE NEGATIVE Final    Comment: (NOTE) If result is NEGATIVE SARS-CoV-2 target nucleic acids are NOT DETECTED. The SARS-CoV-2 RNA is generally detectable in upper and lower  respiratory specimens during the acute phase of infection. The lowest  concentration of SARS-CoV-2 viral copies this assay can detect is 250  copies / mL. A negative result does not preclude SARS-CoV-2 infection  and should not be used as the sole basis for treatment or other  patient management decisions.  A negative result may occur  with  improper specimen collection / handling, submission of specimen other  than nasopharyngeal swab, presence of viral mutation(s) within the  areas targeted by this assay, and inadequate number of viral copies  (<250 copies / mL). A negative result must be combined with clinical  observations, patient history, and epidemiological information. If result is POSITIVE SARS-CoV-2 target nucleic acids are DETECTED. The SARS-CoV-2 RNA is generally detectable in upper and lower  respiratory specimens dur ing the acute phase of infection.  Positive  results are indicative of active infection with SARS-CoV-2.  Clinical  correlation with patient history and other diagnostic information is  necessary to determine patient infection status.  Positive results do  not rule out bacterial infection or co-infection  with other viruses. If result is PRESUMPTIVE POSTIVE SARS-CoV-2 nucleic acids MAY BE PRESENT.   A presumptive positive result was obtained on the submitted specimen  and confirmed on repeat testing.  While 2019 novel coronavirus  (SARS-CoV-2) nucleic acids may be present in the submitted sample  additional confirmatory testing may be necessary for epidemiological  and / or clinical management purposes  to differentiate between  SARS-CoV-2 and other Sarbecovirus currently known to infect humans.  If clinically indicated additional testing with an alternate test  methodology [phone]([id number]) is advised. The SARS-CoV-2 RNA is generally  detectable in upper and lower respiratory sp ecimens during the acute  phase of infection. The expected result is Negative. Fact Sheet for Patients:  BoilerBrush.com.cyhttps://www.fda.gov/media/136312/download Fact Sheet for Healthcare Providers: https://pope.com/https://www.fda.gov/media/136313/download This test is not yet approved or cleared by the [place] FDA and has been authorized for detection and/or diagnosis of SARS-CoV-2 by FDA under an Emergency Use Authorization (EUA).  This EUA  will remain in effect (meaning this test can be used) for the duration of the COVID-19 declaration under Section 564(b)(1) of the Act, 21 U.S.C. section 360bbb-3(b)(1), unless the authorization is terminated or revoked sooner. Performed at [facility], [address] [place], [place] [address]     Signed: Lorin GlassBinaya Jeananne Bedwell  [facility] 12/20/2018, 10:12 AM

## 2018-12-18 NOTE — Care Management Important Message (Signed)
Important Message  Patient Details  Name: Miguel [name] MRN: [id number] Date of Birth: [birth year]   Medicare Important Message Given:  Yes    Dorena Bodo 12/18/2018, 1:54 PM

## 2018-12-18 NOTE — TOC Progression Note (Signed)
Transition of Care [facility]) - Progression Note    Patient Details  Name: Miguel [name] MRN: [id number] Date of Birth: 05/31/1951  Transition of Care [facility]) CM/SW Contact  Kermit Balo, RN Phone Number: 12/18/2018, 5:12 PM  Clinical Narrative:    All DME has been arranged and will be delivered to the home. Pt set up with [facility] for [facility] services.  Will need PTAR home when d/ced. Address in chart is correct.  Contact is daughter: [name]   Expected Discharge Plan: Home w Home Health Services Barriers to Discharge: Continued Medical Work up  Expected Discharge Plan and Services Expected Discharge Plan: Home w Home Health Services     Post Acute Care Choice: Durable Medical Equipment, Home Health Living arrangements for the past 2 months: Apartment                 DME Arranged: 3-N-1, Hospital bed, Other see comment, Wheelchair manual, Suction(hoyer lift) DME Agency: [employer] Date DME Agency Contacted: 12/18/18   Representative spoke with at DME Agency: [facility] HH Arranged: RN, Speech Therapy, PT, OT, Nurse's Aide, Social Work, Refused SNF HH Agency: [facility] Date HH Agency Contacted: 12/17/18 Time HH Agency Contacted: 1524 Representative spoke with at [facility] Agency: Lupita Leash   Social Determinants of Health (SDOH) Interventions    Readmission Risk Interventions No flowsheet data found.

## 2018-12-18 NOTE — Progress Notes (Signed)
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  Patient reportedly had onset of left-sided weakness last night associated with slurred speech. MRI currently pending but CT negative for acute changes.     PT Comments    Patient seen for mobility progression. Pt continues to present with L side weakness (UE > LE), impaired communication, and poor motor planning. Pt requires mod/max A +2 and max cues for functional transfer training. Continue to recommend post acute rehab for further skilled PT services to maximize independence and safety with mobility.    Pt would possibly be good CIR candidate if caregiver assistance is verified although CIR does not have a bed available at this time per CM. If pt/pt's daughter refuse SNF placement pt will need maximized HH therapies, non emergent ambulance transport home, hoyer lift, wheel chair, and 3 in 1.     Follow Up Recommendations  SNF;Supervision/Assistance - 24 hour; CIR     Equipment Recommendations  Rolling walker with 5" wheels;Wheelchair (measurements PT);3in1 (PT)    Recommendations for Other Services       Precautions / Restrictions Precautions Precautions: Fall    Mobility  Bed Mobility Overal bed mobility: Needs Assistance Bed Mobility: Sit to Supine       Sit to supine: Mod assist;+2 for physical assistance   General bed mobility comments: assist to guide trunk and bring bilat LE into bed  Transfers Overall transfer level: Needs assistance Equipment used: 2 person hand held assist(assist at trunk with gait belt)   Sit to Stand: +2 physical assistance;Mod assist Stand pivot transfers: Max assist;+2 physical  assistance       General transfer comment: assist  to power up into standing; no L knee buckiling but assistance required for L LE mobility in standing; pt with poor motor planning and requires assistance on R side for sequencing; pt transfered chair to bed, bed to BSC, and BSC to EOB during session; each pivot pt required more assistance   Ambulation/Gait                 Stairs             Wheelchair Mobility    Modified Rankin (Stroke Patients Only)       Balance Overall balance assessment: Needs assistance Sitting-balance support: No upper extremity supported;Feet supported Sitting balance-Leahy Scale: Fair Sitting balance - Comments: min guard to min assist for safety statically   Standing balance support: During functional activity;Bilateral upper extremity supported Standing balance-Leahy Scale: Poor                              Cognition Arousal/Alertness: Awake/alert Behavior During Therapy: Flat affect Overall Cognitive Status: Difficult to assess Area of Impairment: Following commands;Safety/judgement;Awareness;Problem solving                       Following Commands: Follows one step commands inconsistently;Follows one step commands with increased time Safety/Judgement: Decreased awareness of safety;Decreased awareness of deficits Awareness: Intellectual Problem Solving: Decreased initiation;Difficulty sequencing;Requires verbal cues;Requires tactile cues;Slow processing General Comments: one word answers but mostly appropriate responses      Exercises  General Comments        Pertinent Vitals/Pain Pain Assessment: Faces Faces Pain Scale: No hurt Pain Intervention(s): Monitored during session    Home Living                      Prior Function            PT Goals (current goals can now be found in the care plan section) Progress towards PT goals: Progressing toward goals    Frequency    Min  4X/week      PT Plan Current plan remains appropriate    Co-evaluation PT/OT/SLP Co-Evaluation/Treatment: Yes Reason for Co-Treatment: For patient/therapist safety;To address functional/ADL transfers PT goals addressed during session: Mobility/safety with mobility        AM-PAC PT "6 Clicks" Mobility   Outcome Measure  Help needed turning from your back to your side while in a flat bed without using bedrails?: A Lot Help needed moving from lying on your back to sitting on the side of a flat bed without using bedrails?: A Lot Help needed moving to and from a bed to a chair (including a wheelchair)?: A Lot Help needed standing up from a chair using your arms (e.g., wheelchair or bedside chair)?: A Lot Help needed to walk in hospital room?: Total Help needed climbing 3-5 steps with a railing? : Total 6 Click Score: 10    End of Session Equipment Utilized During Treatment: Gait belt Activity Tolerance: Patient tolerated treatment well Patient left: with call bell/phone within reach;in bed;with bed alarm set Nurse Communication: Mobility status PT Visit Diagnosis: Unsteadiness on feet (R26.81);Muscle weakness (generalized) (M62.81);History of falling (Z91.81);Other symptoms and signs involving the nervous system (R29.898)     Time: 1240-1316 PT Time Calculation (min) (ACUTE ONLY): 36 min  Charges:  $Gait Training: 8-22 mins                     Erline Levine, PTA Acute Rehabilitation Services Pager: [phone] Office: [phone]     Carolynne Edouard 12/18/2018, 4:47 PM

## 2018-12-18 NOTE — Progress Notes (Addendum)
Patient suffers from stroke which impairs their ability to perform daily activities like bathing, dressing, feeding, grooming and toileting in the home.  A cane, crutch or walker will not resolve issue with performing activities of daily living. A wheelchair will allow patient to safely perform daily activities.  Patient self-propels the wheelchair while engaging in frequent activities such as laundry, meals and toileting which cannot be performed in a standard or lightweight wheelchair due to the weight of the chair.

## 2018-12-19 LAB — CBC WITH DIFFERENTIAL/PLATELET
Abs Immature Granulocytes: 0.02 10*3/uL (ref 0.00–0.07)
Basophils Absolute: 0.1 10*3/uL (ref 0.0–0.1)
Basophils Relative: 1 %
Eosinophils Absolute: 0.3 10*3/uL (ref 0.0–0.5)
Eosinophils Relative: 3 %
HCT: 47.8 % (ref 39.0–52.0)
Hemoglobin: 16 g/dL (ref 13.0–17.0)
Immature Granulocytes: 0 %
Lymphocytes Relative: 33 %
Lymphs Abs: 2.8 10*3/uL (ref 0.7–4.0)
MCH: 30 pg (ref 26.0–34.0)
MCHC: 33.5 g/dL (ref 30.0–36.0)
MCV: 89.7 fL (ref 80.0–100.0)
Monocytes Absolute: 0.9 10*3/uL (ref 0.1–1.0)
Monocytes Relative: 10 %
Neutro Abs: 4.7 10*3/uL (ref 1.7–7.7)
Neutrophils Relative %: 53 %
Platelets: 174 10*3/uL (ref 150–400)
RBC: 5.33 MIL/uL (ref 4.22–5.81)
RDW: 14.5 % (ref 11.5–15.5)
WBC: 8.7 10*3/uL (ref 4.0–10.5)
nRBC: 0 % (ref 0.0–0.2)

## 2018-12-19 LAB — BASIC METABOLIC PANEL
Anion gap: 10 (ref 5–15)
BUN: 11 mg/dL (ref 8–23)
CO2: 20 mmol/L — ABNORMAL LOW (ref 22–32)
Calcium: 9.1 mg/dL (ref 8.9–10.3)
Chloride: 108 mmol/L (ref 98–111)
Creatinine, Ser: 0.87 mg/dL (ref 0.61–1.24)
GFR calc Af Amer: >60 mL/min (ref >60)
GFR calc non Af Amer: >60 mL/min (ref >60)
Glucose, Bld: 109 mg/dL — ABNORMAL HIGH (ref 70–99)
Potassium: 3.8 mmol/L (ref 3.5–5.1)
Sodium: 138 mmol/L (ref 135–145)

## 2018-12-19 LAB — MAGNESIUM: Magnesium: 1.9 mg/dL (ref 1.7–2.4)

## 2018-12-19 LAB — PHOSPHORUS: Phosphorus: 3.6 mg/dL (ref 2.5–4.6)

## 2018-12-19 MED ORDER — LEVETIRACETAM IN NACL 500 MG/100ML IV SOLN
500.0000 mg | Freq: Two times a day (BID) | INTRAVENOUS | Status: DC
  Administered 2018-12-19 – 2018-12-20 (×3): 500 mg via INTRAVENOUS
  Filled 2018-12-19 (×3): qty 100

## 2018-12-19 NOTE — Care Management (Signed)
Spoke w patient's daughter [name], [phone] who states that she was told by [employer] that home equipment will be delivered Sunday between 10-2.

## 2018-12-19 NOTE — Progress Notes (Signed)
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  Patient reportedly had onset of left-sided weakness last night associated with slurred speech. MRI currently pending but CT negative for acute changes.     PT Comments    Patient seen for mobility progression. Pt is making progress toward PT goals. Pt does continue to require +2 assist for functional transfer and gait training this session. Pt demonstrates improved L LE coordination with pivotal steps. Continue to progress as tolerated. Current plan remains appropriate.    Follow Up Recommendations  SNF;Supervision/Assistance - 24 hour; CIR     Equipment Recommendations  Rolling walker with 5" wheels;Wheelchair (measurements PT);3in1 (PT) (maximized HH Hoyer lift, PTAR transport if declines post acute rehab)   Recommendations for Other Services       Precautions / Restrictions Precautions Precautions: Fall    Mobility  Bed Mobility Overal bed mobility: Needs Assistance Bed Mobility: Supine to Sit       Sit to supine: Mod assist;HOB elevated   General bed mobility comments: cues for sequencing; pt is able to bring bilat LE to EOB and brought L UE to R side with cues; assistance required to elevate trunk into sitting and to bring hips to EOB with bed pad  Transfers Overall transfer level: Needs assistance Equipment used: 2 person hand held assist(assist at trunk with gait belt) Transfers: Sit to/from BJ'sStand;Stand Pivot Transfers Sit to Stand: +2 physical assistance;Mod assist Stand pivot transfers: +2 physical assistance;Mod assist       General transfer comment: assist to power up into standing and gain balance  upon stand X 2 trials; pt demonstrates improved L LE movement/coordination with pivot transfer compared to previous session; cues for safe hand placement and sequencing throughout; L UE supported by therapist  Ambulation/Gait Ambulation/Gait assistance: Mod assist;Max assist;+2 physical assistance;+2 safety/equipment Gait Distance (Feet): 2 Feet Assistive device: 2 person hand held assist(assist at trunk with gait belt) Gait Pattern/deviations: Step-to pattern;Decreased step length - left;Decreased dorsiflexion - left;Decreased dorsiflexion - right;Decreased step length - right(L lateral lean)     General Gait Details: assistance for balance and weight shifting; mulitmodal cues for weight shifting and sequencing; pt is able to take steps ~2 ft until fatigued   Stairs             Wheelchair Mobility    Modified Rankin (Stroke Patients Only)       Balance Overall balance assessment: Needs assistance Sitting-balance support: No upper extremity supported;Feet supported Sitting balance-Leahy Scale: Fair     Standing balance support: During functional activity;Bilateral upper extremity supported Standing balance-Leahy Scale: Poor                              Cognition Arousal/Alertness: Awake/alert Behavior During Therapy: Flat affect Overall Cognitive Status: Difficult to assess Area of Impairment: Following commands;Safety/judgement;Awareness;Problem solving                       Following Commands: Follows one step commands inconsistently;Follows one step commands with increased time Safety/Judgement: Decreased awareness of safety;Decreased awareness of deficits Awareness: Intellectual Problem Solving: Decreased initiation;Difficulty sequencing;Requires verbal cues;Slow processing General Comments: mostly one word  answers       Exercises      General Comments General comments (skin integrity, edema, etc.): has difficulty managing secretions       Pertinent Vitals/Pain Pain Assessment: Faces Faces Pain Scale: No hurt Pain Intervention(s): Monitored during session    Home Living                      Prior Function            PT Goals (current goals can now be found in the care plan section) Progress towards PT goals: Progressing toward goals    Frequency    Min 4X/week      PT Plan Current plan remains appropriate    Co-evaluation PT/OT/SLP Co-Evaluation/Treatment: Yes Reason for Co-Treatment: For patient/therapist safety;To address functional/ADL transfers PT goals addressed during session: Mobility/safety with mobility;Balance        AM-PAC PT "6 Clicks" Mobility   Outcome Measure  Help needed turning from your back to your side while in a flat bed without using bedrails?: A Little Help needed moving from lying on your back to sitting on the side of a flat bed without using bedrails?: A Lot Help needed moving to and from a bed to a chair (including a wheelchair)?: A Lot Help needed standing up from a chair using your arms (e.g., wheelchair or bedside chair)?: A Lot Help needed to walk in hospital room?: A Lot Help needed climbing 3-5 steps with a railing? : Total 6 Click Score: 12    End of Session Equipment Utilized During Treatment: Gait belt Activity Tolerance: Patient tolerated treatment well Patient left: with call bell/phone within reach;in chair;with chair alarm set Nurse Communication: Mobility status PT Visit Diagnosis: Unsteadiness on feet (R26.81);Muscle weakness (generalized) (M62.81);History of falling (Z91.81);Other symptoms and signs involving the nervous system (V29.191)     Time: 6606-0045 PT Time Calculation (min) (ACUTE ONLY): 35 min  Charges:  $Gait Training: 8-22 mins                     Erline Levine, PTA Acute Rehabilitation Services Pager: [phone] Office: [phone]     Carolynne Edouard 12/19/2018, 10:48 AM

## 2018-12-19 NOTE — Progress Notes (Signed)
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 Hepresenedto the ED on 5/19with left-sided weakness. Patient reportedly had left-sided weakness thenightpriorassociated with slurred speech.  Patient apparently had a fall whiletrying to get to the bathroom which resulted in him urinating on himself. There was no witnessed seizure-like activity at that time.  He has apparently not been on any of his medications for a year.  In the ED,BPwas elevated to205/111.  Subjective: Patient was seen and examined this morning.  Lethargic.  Very slow to respond.  Weak.  Has lot of secretions requiring frequent suctioning.  Assessment/Plan:  Principal Problem:   Left-sided weakness Active Problems:   Hypertension   Alcohol abuse   Smoker   S/P ventriculoperitoneal shunt   Seizure disorder (HCC)   Atrial fibrillation (HCC)   Cerebral embolism with cerebral infarction   Cerebrovascular accident (CVA) due to thrombosis of right middle cerebral artery (HCC)   Sinus arrhythmia  Stroke:Right PLICinfarct more consistent with small vessel disease -Imagings as above. -LDL 136, target less than 70. -Hemoglobin A1c 5.9 -EchocardiogramEF 60 to 65%, no PFO. -Patient was supposed tobe onXarelto at home, questionable compliance.Apparently he had not seen a physician in more than a year. -Neurology consult appreciated. Patient has been started on aspirin 81 mg daily and Plavix 75 mg daily. Continue DAPT for 3 weeks then aspirin alone.  -Not on a statin at home.Lipitor 80 mg daily started. -PT/OT/ST evalobtained.  Patient had significant weakness on the left side along with dysphagia.  -Dysphagia 2 diet per speech therapist recommendation. SNFrecommended by PT. - Patient's daughter does not want him to be placed in a facility because she is afraid he would catch coronavirus.  She wants to take him home with home health.  Home health and DME equipments arranged, apparently will be delivered to home on Sunday 5/24.  Dysphagia/high risk of aspiration -Patient has significant dysphagia.  He is also very weak and unable to clear secretions.  He needs frequent suctioning.  Per family's request, patient will be discharged to home.  However, he is at high risk of readmission from aspiration or respiratory failure.  -Speech therapy reevaluation ordered today. -Continue PEG tube would help his nutritional aspirin but will not prevent the risk of aspirating his own secretions.  History of seizure disorder: -Patient apparently has not been taking home Keppra. -EEG normal. -Continue Keppra at home.  ??Atrial fibrillation: - It is unclear at this time patient has history of A. fib in the past. He however has risk factors for development of A. fib. Patient apparently was on Xarelto at home but noncompliant.  -Telemetry monitoring in the hospital shows frequent sinus arrhythmia.  But no A. fib. - At this time on dual antiplatelet agents. Recommend to follow-up with cardiology as an outpatient.  History ofTBI status VP shunt: VP shunt catheter in place with stable ventricular size and morphology per CT head. MRI was obtained after clearance from neurosurgery. Neurosurgery evaluated and programmed VP shunt after the MRI.  Hypertensive urgency Blood pressure was elevated to 205/111 on admission. Not on home medicines.  Allowed permissive hypertension for 24 to 48 hours.  Blood pressure has remained high in 150s and 160s.  I started him on amlodipine 5 mg daily yesterday. Continue to  monitor blood pressure.  Alcohol use: History of chronic alcohol abuse.  Reported last  drink 2 days prior to admission.  In the hospital, he was placed on CIWA protocol.  No withdrawal symptoms. Continue folic acid vitamin B1 and multivitamins. Counseled to quit alcohol.  Tobacco use: -Nicotine patch.  Counseled to quit  Body mass index is 21.41 kg/m. Mobility: PT eval obtained. Diet: Cardiac diet DVT prophylaxis: SCDs Code Status:  Code Status: Full Code  Family Communication: Disposition Plan: Home with home health likely tomorrow.  Consultants:  Neurology  Procedures:    Antimicrobials:  Anti-infectives (From admission, onward)   None      Infusions:  . sodium chloride 250 mL (12/19/18 1156)  . levETIRAcetam Stopped (12/19/18 1212)    Scheduled Meds: . amLODipine  5 mg Oral Daily  . aspirin EC  81 mg Oral Daily  . atorvastatin  80 mg Oral q1800  . clopidogrel  75 mg Oral Daily  . folic acid  1 mg Oral Daily  . multivitamin with minerals  1 tablet Oral Daily  . nicotine  21 mg Transdermal Daily  . sodium chloride flush  10-40 mL Intracatheter Q12H  . sodium chloride flush  3 mL Intravenous Once  . thiamine  100 mg Oral Daily    PRN meds: sodium chloride, acetaminophen **OR** acetaminophen (TYLENOL) oral liquid 160 mg/5 mL **OR** acetaminophen, labetalol, senna-docusate, sodium chloride flush   Objective: Vitals:   12/19/18 0823 12/19/18 1124  BP: 128/87 (!) 148/75  Pulse: 71 70  Resp: 18 17  Temp: 98.1 F (36.7 C) 98.3 F (36.8 C)  SpO2: 98% 97%    Intake/Output Summary (Last 24 hours) at 12/19/2018 1446 Last data filed at 12/19/2018 1300 Gross per 24 hour  Intake 250.93 ml  Output 200 ml  Net 50.93 ml   Filed Weights   12/15/18 1648 12/15/18 2007  Weight: 70.3 kg 71.6 kg   Weight change:  Body mass index is 21.41 kg/m.   Physical Exam: General exam: Awake, slow to respond.  Skin: No rashes, lesions or ulcers. HEENT: Atraumatic, normocephalic, supple neck, no obvious bleeding Lungs: Transmitted sound in both  lungs CVS: Regular rate and rhythm, no murmur GI/Abd soft, nontender, nondistended, bowel sound present CNS: Alert, awake, slow to respond, confused, Psychiatry: Mood appropriate Extremities: No pedal edema, no calf tenderness  Data Review: I have personally reviewed the laboratory data and studies available.  Recent Labs  Lab 12/15/18 1612 12/15/18 1618 12/19/18 0915  WBC 7.3  --  8.7  NEUTROABS 3.6  --  4.7  HGB 14.3 17.0 16.0  HCT 44.4 50.0 47.8  MCV 93.5  --  89.7  PLT 162  --  174   Recent Labs  Lab 12/15/18 1612 12/15/18 1618 12/19/18 0915  NA 137 137 138  K 3.9 4.9 3.8  CL 105 111 108  CO2 20*  --  20*  GLUCOSE 95 96 109*  BUN 11 17 11   CREATININE 1.04 0.70 0.87  CALCIUM 8.6*  --  9.1  MG  --   --  1.9  PHOS  --   --  3.6    Lorin Glass, MD  [facility] 12/19/2018

## 2018-12-19 NOTE — Progress Notes (Addendum)
Patient with increase coughs with couple bites of oral intake. Rn follow SLP rec'd with feeding, pt appears unable to follow command. Oral intake stop and make NPO until eval from MD. MD notified. VSS.  Sim Boast, RN

## 2018-12-19 NOTE — Progress Notes (Signed)
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  Patient reportedly had onset of left-sided weakness last night associated with slurred speech. MRI currently pending but CT negative for acute changes.    OT comments  Pt requiring mod to maxA+2 for bed mobility and stand pivot transfers from bed <-> BSC to recliner. Pt using RUE to weight shift with hand held assist for movement of BLEs. LUE continues to be nearly flaccid with cues to attend to LUE. Pt sat on BSC x10 mins with fair sitting balance and leaning to R side "on purpose" per pt. Pt totalA for toielt hygiene and RUE required for stability and LUE flaccid. Pt performing ADL functional mobility with max+2 and better movement of BLEs from previous notes with max verbal cues for proper technique. Pt would benefit from continued OT skilled services for ADL, mobility and safety in SNF setting. If family adamant about taking him home, below equipment is required and HHOT. OT following acutely.     Follow Up Recommendations  SNF;Supervision/Assistance - 24 hour(pt may progress to [facility] if +2 at home for transfers or hoyer)    Equipment Recommendations  3 in 1 bedside commode;Hospital bed;Wheelchair (measurements OT);Wheelchair cushion (measurements OT)    Recommendations for Other Services      Precautions / Restrictions Precautions Precautions: Fall Restrictions Weight Bearing Restrictions: No       Mobility Bed Mobility Overal bed mobility: Needs Assistance Bed Mobility: Supine to Sit       Sit to supine: Mod assist;HOB elevated   General bed mobility comments: cues for sequencing; pt is able to bring bilat LE to  EOB and brought L UE to R side with cues; assistance required to elevate trunk into sitting and to bring hips to EOB with bed pad  Transfers Overall transfer level: Needs assistance Equipment used: 2 person hand held assist(assist at trunk with gait belt) Transfers: Sit to/from [employer] Transfers Sit to Stand: Mod assist;+2 physical assistance Stand pivot transfers: Mod assist;+2 physical assistance       General transfer comment: assist to power up into standing and gain balance upon stand X 2 trials; pt demonstrates improved L LE movement/coordination with pivot transfer compared to previous session; cues for safe hand placement and sequencing throughout; L UE supported by therapist    Balance Overall balance assessment: Needs assistance Sitting-balance support: No upper extremity supported;Feet supported Sitting balance-Leahy Scale: Fair     Standing balance support: During functional activity;Bilateral upper extremity supported Standing balance-Leahy Scale: Poor                             ADL either performed or assessed with clinical judgement   ADL Overall ADL's : Needs assistance/impaired                                     Functional mobility during ADLs: Maximal assistance;+2 for physical assistance General ADL Comments: Pt transferred from recliner to bed. While sitting EOB, he reported need to have BM on BSC. Transferred from bed to 3n1 and then back to bed. Pt  unsuccessful with having BM.     Vision   Vision Assessment?: No apparent visual deficits   Perception     Praxis      Cognition Arousal/Alertness: Awake/alert Behavior During Therapy: Flat affect Overall Cognitive Status: Difficult to assess Area of Impairment: Following commands;Safety/judgement;Awareness;Problem solving                       Following Commands: Follows one step commands inconsistently;Follows one step commands with increased  time Safety/Judgement: Decreased awareness of safety;Decreased awareness of deficits Awareness: Intellectual Problem Solving: Decreased initiation;Difficulty sequencing;Requires verbal cues;Slow processing General Comments: mostly one word answers         Exercises     Shoulder Instructions       General Comments Pt required assist to wipe secretions. Pt leaning forward to R side on commode and required cues to pay attention to LUE.    Pertinent Vitals/ Pain       Pain Assessment: No/denies pain Faces Pain Scale: No hurt Pain Intervention(s): Monitored during session  Home Living                                          Prior Functioning/Environment              Frequency  Min 3X/week        Progress Toward Goals  OT Goals(current goals can now be found in the care plan section)  Progress towards OT goals: Progressing toward goals  Acute Rehab OT Goals Patient Stated Goal: none stated Time For Goal Achievement: 12/30/18 Potential to Achieve Goals: Good ADL Goals Pt Will Perform Grooming: with set-up;sitting Pt Will Perform Upper Body Bathing: with min assist;sitting Pt Will Transfer to Toilet: with min assist;bedside commode;stand pivot transfer Pt Will Perform Toileting - Clothing Manipulation and hygiene: with min assist;sit to/from stand Pt/caregiver will Perform Home Exercise Program: Increased ROM;Left upper extremity;With written HEP provided Additional ADL Goal #1: Pt will follow 1 step commands with 90% accuracy in order to optimize participation in ADLs.  Plan Discharge plan remains appropriate    Co-evaluation      Reason for Co-Treatment: Complexity of the patient's impairments (multi-system involvement);To address functional/ADL transfers   OT goals addressed during session: ADL's and self-care      AM-PAC OT "6 Clicks" Daily Activity     Outcome Measure   Help from another person eating meals?: Total Help from another  person taking care of personal grooming?: A Lot Help from another person toileting, which includes using toliet, bedpan, or urinal?: Total Help from another person bathing (including washing, rinsing, drying)?: A Lot Help from another person to put on and taking off regular upper body clothing?: A Lot Help from another person to put on and taking off regular lower body clothing?: Total 6 Click Score: 9    End of Session Equipment Utilized During Treatment: Gait belt  OT Visit Diagnosis: Other abnormalities of gait and mobility (R26.89);Muscle weakness (generalized) (M62.81);Hemiplegia and hemiparesis;Other symptoms and signs involving cognitive function;Cognitive communication deficit (R41.841) Symptoms and signs involving cognitive functions: Cerebral infarction Hemiplegia - Right/Left: Left Hemiplegia - dominant/non-dominant: Dominant Hemiplegia - caused by: Unspecified   Activity Tolerance Patient tolerated treatment well   Patient Left in chair;with call bell/phone within reach;with chair alarm set   Nurse Communication Mobility status        Time: [phone] OT  Time Calculation (min): 32 min  Charges: OT General Charges $OT Visit: 1 Visit OT Treatments $Neuromuscular Re-education: 8-22 mins  Cristi LoronAllison (Jelenek) [place]ell [place] OTR/L Acute Rehabilitation Services Pager: [phone] Office: [phone]    Lonzo CloudLLISON J Ediel Unangst 12/19/2018, 4:29 PM

## 2018-12-19 NOTE — Progress Notes (Signed)
SLP Cancellation Note  Patient Details Name: Miguel [name] MRN: [id number] DOB: 30-Nov-1950   Cancelled treatment:       Reason Eval/Treat Not Completed: Patient declined, no reason specified   Tressie Stalker, M.S., CCC-SLP 12/19/2018, 3:12 PM

## 2018-12-20 MED ORDER — LEVETIRACETAM 500 MG PO TABS: 500.0000 mg | ORAL_TABLET | Freq: Two times a day (BID) | ORAL | 0 refills | Status: DC

## 2018-12-20 MED ORDER — CLOPIDOGREL BISULFATE 75 MG PO TABS: 75.0000 mg | ORAL_TABLET | Freq: Every day | ORAL | 0 refills | Status: AC

## 2018-12-20 MED ORDER — ATORVASTATIN CALCIUM 80 MG PO TABS: 80.0000 mg | ORAL_TABLET | Freq: Every day | ORAL | 2 refills | Status: DC

## 2018-12-20 MED ORDER — ASPIRIN 81 MG PO TBEC: 81.0000 mg | DELAYED_RELEASE_TABLET | Freq: Every day | ORAL | 0 refills | Status: DC

## 2018-12-20 MED ORDER — AMLODIPINE BESYLATE 5 MG PO TABS: 5.0000 mg | ORAL_TABLET | Freq: Every day | ORAL | 0 refills | Status: DC

## 2018-12-20 MED ORDER — FOLIC ACID 1 MG PO TABS: 1.0000 mg | ORAL_TABLET | Freq: Every day | ORAL | 0 refills | Status: DC
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
## 2018-12-20 NOTE — Progress Notes (Signed)
RN reviewed AVS and feeding plan with patient's daughter by phone.Awaiting for PTAR for transportation. All belongings sent with patient to home by PTAR.   Sim Boast, RN

## 2018-12-20 NOTE — TOC Transition Note (Signed)
Transition of Care [facility]) - CM/SW Discharge Note   Patient Details  Name: MUSCAB GOODLOW MRN: [id number] Date of Birth: 1950-11-13  Transition of Care [facility]) CM/SW Contact:  Lawerance Sabal, RN Phone Number: 12/20/2018, 10:56 AM   Clinical Narrative:    Sherron Monday w patient's daughter. She confirms she has been contacted by [employer] to deliver DME to house today. There is a window until 2pm, she will need some time to set up bed etc, and we discussed patient pick up via PTAR around 4pm. Notified [facility] that patient will DC today. Updated bedside RN Havy on plan. PTAR papers placed on chart.       Barriers to Discharge: Continued Medical Work up   Patient Goals and CMS Choice Patient states their goals for this hospitalization and ongoing recovery are:: go home with his daughter CMS Medicare.gov Compare Post Acute Care list provided to:: Patient Represenative (must comment) Choice offered to / list presented to : Adult Children  Discharge Placement                       Discharge Plan and Services     Post Acute Care Choice: Durable Medical Equipment, Home Health          DME Arranged: 3-N-1, Hospital bed, Other see comment, Wheelchair manual, Suction(hoyer lift) DME Agency: [facility] Date DME Agency Contacted: 12/18/18   Representative spoke with at DME Agency: Timothy Lasso HH Arranged: RN, Speech Therapy, PT, OT, Nurse's Aide, Social Work, Refused SNF HH Agency: [facility] Date HH Agency Contacted: 12/17/18 Time HH Agency Contacted: 1524 Representative spoke with at [name] Agency: Lupita Leash  Social Determinants of Health (SDOH) Interventions     Readmission Risk Interventions No flowsheet data found.

## 2018-12-22 ENCOUNTER — Telehealth: Payer: Self-pay | Admitting: Neurology

## 2018-12-22 NOTE — Telephone Encounter (Signed)
Pt gave consent for a video visit with our office. Pt understands that although there may be some limitations with this type of visit, we will take all precautions to reduce any security or privacy concerns.  Pt understands that this will be treated like an in office visit and we will file with pt's insurance, and there may be a patient responsible charge related to this service.

## 2018-12-24 ENCOUNTER — Other Ambulatory Visit: Payer: Self-pay | Admitting: *Deleted

## 2018-12-24 NOTE — Patient Outreach (Addendum)
[facility] Care Management  12/24/2018  Miguel [name] [birth year] [id number]   Subjective: Per chart review, Patient has significant dysphagia. Telephone call to patient's home number, spoke with patient, received verbal consent from patient to speak with daughter [name]([name] Mayfield), regarding healthcare needs.  Spoke with patient and daughter on speaker phone throughout call.   Daughter stated patient's name, date of birth, and address.    States she currently handling all of patient's medical affairs and is planning to assist patient with completion of power of attorney ( Advanced Directive) documents in the future.   [facility] Medicare EMMI General Discharge Red Flag Alert follow up, daughter voiced understanding, and is in agreement to follow up on patient's behalf. States she remember receiving EMMI automated calls and answered on patient's behalf.  Daughter states patient lives with her, she provides 24 hour care, she has a nursing assistance background, has had back surgery in the past unable to do heavy lifiting, she is being trained by home health agency on hoyer lift, and other care items (food preparation due to dysphagia).  Daughter states she does not have a car, home health physical therapist assisted her with starting the SCAT application, requesting assistance with expediting the SCAT arrangement to patient's hospital follow up [facility] to establish primary care with Dr. Hoy RegisterEnobong Newlin.  Daughter in agreement to referral to [facility] Social Worker for transportation assistance and other possible community services related to daughter being patient's primary caregiver in lieu of rehab placement.  Daughter states she refused skilled nursing home placement for rehab due to Covid 19 pandemic and wanted patient's rehab at home.  States patient has home health services (RN,  Speech therapy, Physical therapy, Occupational therapy, Aide) through [facility] and services are going well.  Patient also has the following durable medical equipment: manual wheelchair, hoyer lift, and bedside commode.  Daughter states Virgina EvenerFrench Shaw is no assisting with  patient's care, requested removal of Ms. Shaw's name from patient's emergency contact list, and patient is in agreement. Daughter states patient  does not have any education material, EMMI follow up, care coordination, care management, disease monitoring, or pharmacy needs at this time.  States she is very appreciative of the follow up and is in agreement to receive [facility] services on patient's behalf.    Objective:  Per KPN (Knowledge Performance Now, point of care tool) and chart review, patient hospitalized 12/15/2018 - 12/20/2018 for left side weakness, Stroke:Right PLICinfarct more consistent with small vessel disease, Dysphagia/high risk of aspiration.  Patient also has a history of seizure disorder, hypertension, Unclear history of atrial fibrillation, traumatic brain injury (TBI)  with VP shunt, chronic alcohol abuse, Tobacco use, Cerebral embolism with cerebral infarction, and Cerebrovascular accident (CVA) due to thrombosis of right middle cerebral artery.    Assessment: Received Medicare EMMI Stroke Red Flag Alert follow up referral on 12/23/2018.  Red Flag Triggers times 2, Day #1, patient answered no to the following question: Have a way to get to follow-up appointment?   Patient answered yes to the following question: Problems setting up rehab?  EMMI follow up completed and patient will be [facility] Social Worker for  transportation assistance and other possible community services related to daughter being patient's primary caregiver in lieu of rehab placement.      Plan: RNCM will [facility]  for transportation assistance and other possible  community services related to daughter being patient's primary caregiver in lieu of rehab placement.  RNCM will send request to Iverson Alamin at [facility] Care Management to remove Ms. Virgina Evener from patient's emergency contact list per patient and patient's daughter.       Romar Woodrick H. Gardiner Barefoot, BSN, CCM [facility] Care Management [facility] Telephonic CM Phone: [phone] Fax: [phone]

## 2018-12-25 ENCOUNTER — Other Ambulatory Visit: Payer: Self-pay

## 2018-12-25 NOTE — Patient Outreach (Signed)
[facility] Care Management  12/25/2018  Miguel [name] [birth year] [id number]   Successful outreach to patient's daughter, [name], regarding social work referral for transportation and in-home aide resources.  Daughter reports that SCAT application has been completed and signed by PT, however it has not yet been submitted.  BSW provided her with mailing address to send application. BSW informed daughter that patient may be eligible for Medicaid transportation.  BSW and daughter conducted three way call to [facility] DSS.  An assessment must be completed to determine patient's eligibility for transportation services.  Daughter will be contacted by a caseworker within the next 24-48 hours for completion of assessment.  Patient has follow up appointment scheduled for 01/06/19.  BSW agreed to arrange transportation via [facility] contract with [employer] if patient does not get approved for transportation prior to this date.  BSW will call daughter back next week regarding status of Medicaid transportation. BSW educated daughter about process for requesting personal care services.  Daughter plans to discuss this with Dr. Alvis Lemmings during the appointment on 6/10 and ask that the request form be completed and sent to [facility].   BSW will follow up with daughter next week regarding transportation and will follow up again after 6/10 appointment regarding status of request for PCS.  Malachy Chamber, BSW Social Worker [phone]

## 2019-01-01 ENCOUNTER — Other Ambulatory Visit: Payer: Self-pay

## 2019-01-01 NOTE — Patient Outreach (Signed)
[facility]) Care Management  01/01/2019  BRIK BRADFORD [birth year] [id number]  Successful outreach to patient's daughter regarding social work referral for transportation resources.  Daughter has not yet submitted SCAT application but patient was approved for Medicaid transportation.  Daughter prefers to attend MD appointments with patient but was unsure if Medicaid transportation allows escorts.  BSW contacted [facility] DSS to inquire about this.  Patients are allowed escorts if MD writes a note speaking to this necessity.  Unfortunately, Dr. Alvis Lemmings is unable to provide this note because he has not yet seen this patient.   BSW will arrange transportation via [employer] for the upcoming appointment.  BSW encouraged daughter to submit SCAT application as soon as possible.  BSW also encouraged her to request that Dr. Alvis Lemmings complete a letter during this appointment so that she may ride with patient via Medicaid transportation. BSW will follow up with daughter after appointment with Dr. Alvis Lemmings.  Malachy Chamber, BSW Social Worker [phone]

## 2019-01-06 ENCOUNTER — Ambulatory Visit: Payer: Medicare Other | Attending: Family Medicine | Admitting: Family Medicine

## 2019-01-06 ENCOUNTER — Encounter: Payer: Self-pay | Admitting: Family Medicine

## 2019-01-06 ENCOUNTER — Other Ambulatory Visit: Payer: Self-pay

## 2019-01-06 VITALS — BP 107/73 | HR 78 | Temp 97.8°F

## 2019-01-06 DIAGNOSIS — R399 Unspecified symptoms and signs involving the genitourinary system: Secondary | ICD-10-CM | POA: Diagnosis not present

## 2019-01-06 DIAGNOSIS — G40909 Epilepsy, unspecified, not intractable, without status epilepticus: Secondary | ICD-10-CM

## 2019-01-06 DIAGNOSIS — K5909 Other constipation: Secondary | ICD-10-CM

## 2019-01-06 DIAGNOSIS — I63311 Cerebral infarction due to thrombosis of right middle cerebral artery: Secondary | ICD-10-CM

## 2019-01-06 DIAGNOSIS — I10 Essential (primary) hypertension: Secondary | ICD-10-CM | POA: Diagnosis not present

## 2019-01-06 LAB — POCT URINALYSIS DIP (CLINITEK)
Blood, UA: NEGATIVE
Glucose, UA: NEGATIVE mg/dL
Leukocytes, UA: NEGATIVE
Nitrite, UA: NEGATIVE
POC PROTEIN,UA: 30 — AB
Spec Grav, UA: >=1.03 — AB (ref 1.010–1.025)
Urobilinogen, UA: 1 E.U./dL
pH, UA: 6 (ref 5.0–8.0)

## 2019-01-06 MED ORDER — AMLODIPINE BESYLATE 5 MG PO TABS: 5.0000 mg | ORAL_TABLET | Freq: Every day | ORAL | 1 refills | Status: DC

## 2019-01-06 MED ORDER — POLYETHYLENE GLYCOL 3350 17 GM/SCOOP PO POWD: 17.0000 g | Freq: Every day | ORAL | 1 refills | Status: DC

## 2019-01-06 MED ORDER — ATORVASTATIN CALCIUM 80 MG PO TABS: 80.0000 mg | ORAL_TABLET | Freq: Every day | ORAL | 1 refills | Status: DC
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
## 2019-01-07 ENCOUNTER — Telehealth: Payer: Self-pay

## 2019-01-07 ENCOUNTER — Other Ambulatory Visit: Payer: Self-pay

## 2019-01-07 ENCOUNTER — Encounter: Payer: Self-pay | Admitting: Family Medicine

## 2019-01-07 NOTE — Telephone Encounter (Signed)
Amber with [facility] [employer] called stating the patients daughter needs a letter sent to [facility] in order for her to be able to ride with him. Please follow up.

## 2019-01-07 NOTE — Patient Outreach (Signed)
[place] [facility] [facility]) Care Management  01/07/2019  HY SWIATEK 21-Nov-1950 [id number]  Successful follow up call to patient's daughter, [name], regarding social work referral for transportation resources and request for [employer].  Patient has been approved for Medicaid transportation.  Daughter prefers to attend MD appointments with patient but a note from his MD is required in order for her to ride with him via Medicaid transportation.  Daughter was going to discuss this with [name] during appointment yesterday but forgot.  BSW contacted [employer] and Wellness regarding this request.  Representative took message and said a nurse would call back.    Daughter would also like for patient to be assessed for [place].  Request form faxed to [name]. BSW will follow up with daughter when response is received regarding MD note for Medicaid transportation.   [name], BSW Social Worker [phone]

## 2019-01-07 NOTE — Telephone Encounter (Signed)
Will route to PCP for review. 

## 2019-01-11 ENCOUNTER — Ambulatory Visit: Payer: Self-pay

## 2019-01-11 NOTE — Telephone Encounter (Signed)
Letter is ready for pick-up.

## 2019-01-12 ENCOUNTER — Other Ambulatory Visit: Payer: Self-pay

## 2019-01-12 NOTE — Patient Outreach (Signed)
[facility]) Care Management  01/12/2019  Miguel [name] [birth year] [id number]   Spoke with Miguel [name] at [facility] and Wellness.  [name] completed letter so that daughter can ride with patient via Medicaid Transportation to MD appointments.  For HIPAA reasons, letter cannot be faxed directly to [place] without consent.  Daughter can request that letter be mailed to patient's address. BSW inquired about status of request for [employer] that was faxed on 01/07/19.  Miguel [name] said she would follow up with [name] nurse and call me back. BSW contacted patient's daughter and provided her with the above information.   BSW will follow up with her again when response is received about status of request for pcs.  Miguel [name], BSW Social Worker [phone]

## 2019-01-13 NOTE — Telephone Encounter (Signed)
Patient daughter has been called and informed of letter being ready for pick up.

## 2019-01-14 ENCOUNTER — Other Ambulatory Visit: Payer: Self-pay

## 2019-01-14 ENCOUNTER — Inpatient Hospital Stay: Payer: Medicare Other | Admitting: Neurology

## 2019-01-19 ENCOUNTER — Telehealth: Payer: Self-pay | Admitting: Family Medicine

## 2019-01-19 ENCOUNTER — Other Ambulatory Visit: Payer: Self-pay

## 2019-01-19 NOTE — Patient Outreach (Addendum)
[place] [name]) Care Management  01/19/2019  Miguel [name] 1950/10/26 [id number]   Outreach to [facility] and Wellness regarding the status of request for [place] that was faxed on 01/07/19.  Spoke with [name].  He was unable to confirm that fax was received but reported that it was likely forwarded to [name] nurse.  [name] he would message this nurse and have them contact me if request not received or additional information is needed. Will follow up again next week if completed form has not been received by then.  Addendum: PCS form received, however, Section 4 was not completed.  [name] will not process incomplete form.  Faxed back to [name].  [name], BSW Social Worker [phone]

## 2019-01-19 NOTE — Telephone Encounter (Signed)
Paperwork has been completed by PCP and faxed over to [facility]

## 2019-01-19 NOTE — Telephone Encounter (Signed)
Amber from [employer]) called to request an update on some paperwork faxed 01/07/19, this paperwork is for [place], she is aware the letter for medicaid has been completed and would just like an update or a call back with any question or concerns about the remaining documents. Please follow up as soon as possible  -[phone]

## 2019-01-20 NOTE — Telephone Encounter (Signed)
I called pts Miguel [name] to remind her of pts appt with Dr.Sethi on Monday. She has been unable to get in contact with [employer] for transportation. She cancel appt until she get transportation set up The daughter had a video visit on 6/18 with Dr.SEthi but was unable to get connection.

## 2019-01-21 ENCOUNTER — Other Ambulatory Visit: Payer: Self-pay

## 2019-01-21 NOTE — Patient Outreach (Signed)
[facility] [facility]) Care Management  01/21/2019  EDMAR BLANKENBURG Nov 27, 1950 [id number]   Completed request for [place] faxed to [facility].  Will follow up within the next two business days to ensure receipt.  [name], BSW Social Worker [phone]

## 2019-01-25 ENCOUNTER — Other Ambulatory Visit: Payer: Self-pay

## 2019-01-25 ENCOUNTER — Ambulatory Visit: Payer: Self-pay | Admitting: Neurology

## 2019-01-25 NOTE — Patient Outreach (Signed)
[place]) Care Management  01/25/2019  WALT GEATHERS [birth year] [id number]   [place] regarding status of request for [place].  Request has been received and patient's daughter will be contacted to schedule assessment.  BSW contacted daughter to inform her of this.  BSW provided her with contact information for [facility] and encouraged her to contact them to schedule assessment versus waiting for call.  BSW will follow up with daughter in the next two weeks regarding result of assessment.  [name], BSW Social Worker [phone]

## 2019-02-01 ENCOUNTER — Telehealth: Payer: Self-pay | Admitting: Family Medicine

## 2019-02-01 NOTE — Telephone Encounter (Signed)
Miguel [name] was called and given verbal orders for speech therapy.

## 2019-02-01 NOTE — Telephone Encounter (Signed)
Sonia Baller called requesting orders for speech therapy speech and swallowing problems after his stroke..once a week for 3 weeks, please follow up

## 2019-02-08 ENCOUNTER — Other Ambulatory Visit: Payer: Self-pay

## 2019-02-08 NOTE — Patient Outreach (Signed)
[facility]) Care Management  02/08/2019  Miguel [name] 05-Dec-1950 [id number]   Successful follow up call to patient's daughter regarding status of request for personal care services.  Patient has been assessed and approved for services.  Patient and daughter selected three potential agencies to provide service and they are awaiting follow up from [facility] about availability.  BSW will follow up with daughter again within the next two weeks to ensure agency has been located.  [name], BSW Social Worker [phone]

## 2019-02-08 NOTE — Telephone Encounter (Signed)
Patient verified DOB Ma PROVIDED 1 MORE VISIT FOR PATIENT.

## 2019-02-22 ENCOUNTER — Other Ambulatory Visit: Payer: Self-pay

## 2019-02-22 NOTE — Patient Outreach (Signed)
[facility]) Care Management  02/22/2019  Miguel [name] Mar 19, 1951 [id number]   Outreach to patient's daughter regarding status of request for personal care services.  Patient was approved for services but they have decided not to proceed at this time due to concerns about COVID19.  Daughter is not comfortable with staff coming into patient's home due to the potential for exposure. BSW did inform daughter that another request form will likely have to be completed and patient will have to be assessed again when they decide to proceed with services.  BSW is closing case but encouraged daughter to call if additional needs arise  [name], [place] Worker [phone]

## 2019-02-24 ENCOUNTER — Inpatient Hospital Stay: Payer: Medicare Other | Admitting: Neurology

## 2019-03-02 ENCOUNTER — Encounter: Payer: Self-pay | Admitting: Neurology

## 2019-03-07 ENCOUNTER — Other Ambulatory Visit: Payer: Self-pay

## 2019-03-07 ENCOUNTER — Encounter (HOSPITAL_COMMUNITY): Payer: Self-pay | Admitting: Emergency Medicine

## 2019-03-07 ENCOUNTER — Emergency Department (HOSPITAL_COMMUNITY): Payer: Medicare Other

## 2019-03-07 ENCOUNTER — Inpatient Hospital Stay (HOSPITAL_COMMUNITY)
Admission: EM | Admit: 2019-03-07 | Discharge: 2019-03-22 | DRG: 391 | Disposition: A | Discharge status: Skilled Nursing Facility | Payer: Medicare Other | Attending: Internal Medicine | Admitting: Internal Medicine

## 2019-03-07 DIAGNOSIS — F1010 Alcohol abuse, uncomplicated: Secondary | ICD-10-CM | POA: Diagnosis not present

## 2019-03-07 DIAGNOSIS — Z20828 Contact with and (suspected) exposure to other viral communicable diseases: Secondary | ICD-10-CM | POA: Diagnosis present

## 2019-03-07 DIAGNOSIS — I69392 Facial weakness following cerebral infarction: Secondary | ICD-10-CM | POA: Diagnosis not present

## 2019-03-07 DIAGNOSIS — E860 Dehydration: Secondary | ICD-10-CM | POA: Diagnosis present

## 2019-03-07 DIAGNOSIS — E43 Unspecified severe protein-calorie malnutrition: Secondary | ICD-10-CM | POA: Diagnosis present

## 2019-03-07 DIAGNOSIS — R1310 Dysphagia, unspecified: Secondary | ICD-10-CM

## 2019-03-07 DIAGNOSIS — E785 Hyperlipidemia, unspecified: Secondary | ICD-10-CM | POA: Diagnosis present

## 2019-03-07 DIAGNOSIS — Z86718 Personal history of other venous thrombosis and embolism: Secondary | ICD-10-CM

## 2019-03-07 DIAGNOSIS — I69391 Dysphagia following cerebral infarction: Secondary | ICD-10-CM | POA: Diagnosis not present

## 2019-03-07 DIAGNOSIS — R1312 Dysphagia, oropharyngeal phase: Principal | ICD-10-CM | POA: Diagnosis present

## 2019-03-07 DIAGNOSIS — R509 Fever, unspecified: Secondary | ICD-10-CM

## 2019-03-07 DIAGNOSIS — I4891 Unspecified atrial fibrillation: Secondary | ICD-10-CM | POA: Diagnosis present

## 2019-03-07 DIAGNOSIS — R112 Nausea with vomiting, unspecified: Secondary | ICD-10-CM | POA: Diagnosis not present

## 2019-03-07 DIAGNOSIS — R4702 Dysphasia: Secondary | ICD-10-CM | POA: Diagnosis present

## 2019-03-07 DIAGNOSIS — Z6820 Body mass index (BMI) 20.0-20.9, adult: Secondary | ICD-10-CM

## 2019-03-07 DIAGNOSIS — T17908A Unspecified foreign body in respiratory tract, part unspecified causing other injury, initial encounter: Secondary | ICD-10-CM

## 2019-03-07 DIAGNOSIS — G40909 Epilepsy, unspecified, not intractable, without status epilepticus: Secondary | ICD-10-CM | POA: Diagnosis present

## 2019-03-07 DIAGNOSIS — R29810 Facial weakness: Secondary | ICD-10-CM | POA: Diagnosis present

## 2019-03-07 DIAGNOSIS — F1011 Alcohol abuse, in remission: Secondary | ICD-10-CM | POA: Diagnosis present

## 2019-03-07 DIAGNOSIS — J9811 Atelectasis: Secondary | ICD-10-CM | POA: Diagnosis present

## 2019-03-07 DIAGNOSIS — I739 Peripheral vascular disease, unspecified: Secondary | ICD-10-CM | POA: Diagnosis present

## 2019-03-07 DIAGNOSIS — I69321 Dysphasia following cerebral infarction: Secondary | ICD-10-CM | POA: Diagnosis not present

## 2019-03-07 DIAGNOSIS — I10 Essential (primary) hypertension: Secondary | ICD-10-CM | POA: Diagnosis present

## 2019-03-07 DIAGNOSIS — Z87891 Personal history of nicotine dependence: Secondary | ICD-10-CM | POA: Diagnosis not present

## 2019-03-07 DIAGNOSIS — Z931 Gastrostomy status: Secondary | ICD-10-CM | POA: Diagnosis not present

## 2019-03-07 DIAGNOSIS — Z823 Family history of stroke: Secondary | ICD-10-CM

## 2019-03-07 DIAGNOSIS — Z982 Presence of cerebrospinal fluid drainage device: Secondary | ICD-10-CM | POA: Diagnosis not present

## 2019-03-07 DIAGNOSIS — R1110 Vomiting, unspecified: Secondary | ICD-10-CM

## 2019-03-07 DIAGNOSIS — Z79899 Other long term (current) drug therapy: Secondary | ICD-10-CM

## 2019-03-07 DIAGNOSIS — I69398 Other sequelae of cerebral infarction: Secondary | ICD-10-CM | POA: Diagnosis not present

## 2019-03-07 DIAGNOSIS — I69354 Hemiplegia and hemiparesis following cerebral infarction affecting left non-dominant side: Secondary | ICD-10-CM | POA: Diagnosis not present

## 2019-03-07 DIAGNOSIS — Z87820 Personal history of traumatic brain injury: Secondary | ICD-10-CM

## 2019-03-07 DIAGNOSIS — J690 Pneumonitis due to inhalation of food and vomit: Secondary | ICD-10-CM

## 2019-03-07 DIAGNOSIS — Z9114 Patient's other noncompliance with medication regimen: Secondary | ICD-10-CM

## 2019-03-07 DIAGNOSIS — Z8679 Personal history of other diseases of the circulatory system: Secondary | ICD-10-CM | POA: Diagnosis not present

## 2019-03-07 DIAGNOSIS — Z7982 Long term (current) use of aspirin: Secondary | ICD-10-CM | POA: Diagnosis not present

## 2019-03-07 DIAGNOSIS — I63311 Cerebral infarction due to thrombosis of right middle cerebral artery: Secondary | ICD-10-CM | POA: Diagnosis present

## 2019-03-07 DIAGNOSIS — R7989 Other specified abnormal findings of blood chemistry: Secondary | ICD-10-CM | POA: Diagnosis not present

## 2019-03-07 DIAGNOSIS — R64 Cachexia: Secondary | ICD-10-CM | POA: Diagnosis not present

## 2019-03-07 LAB — COMPREHENSIVE METABOLIC PANEL
ALT: 22 U/L (ref 0–44)
AST: 21 U/L (ref 15–41)
Albumin: 4 g/dL (ref 3.5–5.0)
Alkaline Phosphatase: 69 U/L (ref 38–126)
Anion gap: 14 (ref 5–15)
BUN: 16 mg/dL (ref 8–23)
CO2: 21 mmol/L — ABNORMAL LOW (ref 22–32)
Calcium: 9.5 mg/dL (ref 8.9–10.3)
Chloride: 106 mmol/L (ref 98–111)
Creatinine, Ser: 1.15 mg/dL (ref 0.61–1.24)
GFR calc Af Amer: >60 mL/min (ref >60)
GFR calc non Af Amer: >60 mL/min (ref >60)
Glucose, Bld: 75 mg/dL (ref 70–99)
Potassium: 4 mmol/L (ref 3.5–5.1)
Sodium: 141 mmol/L (ref 135–145)
Total Bilirubin: 1.3 mg/dL — ABNORMAL HIGH (ref 0.3–1.2)
Total Protein: 8.1 g/dL (ref 6.5–8.1)

## 2019-03-07 LAB — CBC WITH DIFFERENTIAL/PLATELET
Abs Immature Granulocytes: 0.02 10*3/uL (ref 0.00–0.07)
Basophils Absolute: 0.1 10*3/uL (ref 0.0–0.1)
Basophils Relative: 1 %
Eosinophils Absolute: 0.4 10*3/uL (ref 0.0–0.5)
Eosinophils Relative: 5 %
HCT: 49.1 % (ref 39.0–52.0)
Hemoglobin: 15.9 g/dL (ref 13.0–17.0)
Immature Granulocytes: 0 %
Lymphocytes Relative: 44 %
Lymphs Abs: 3.3 10*3/uL (ref 0.7–4.0)
MCH: 30 pg (ref 26.0–34.0)
MCHC: 32.4 g/dL (ref 30.0–36.0)
MCV: 92.6 fL (ref 80.0–100.0)
Monocytes Absolute: 0.6 10*3/uL (ref 0.1–1.0)
Monocytes Relative: 8 %
Neutro Abs: 3.1 10*3/uL (ref 1.7–7.7)
Neutrophils Relative %: 42 %
Platelets: 231 10*3/uL (ref 150–400)
RBC: 5.3 MIL/uL (ref 4.22–5.81)
RDW: 14.5 % (ref 11.5–15.5)
WBC: 7.5 10*3/uL (ref 4.0–10.5)
nRBC: 0 % (ref 0.0–0.2)

## 2019-03-07 LAB — SARS CORONAVIRUS 2 BY RT PCR (HOSPITAL ORDER, PERFORMED IN ~~LOC~~ HOSPITAL LAB): SARS Coronavirus 2: NEGATIVE

## 2019-03-07 MED ORDER — ACETAMINOPHEN 325 MG PO TABS: 650.0000 mg | ORAL_TABLET | Freq: Four times a day (QID) | ORAL | Status: DC | PRN

## 2019-03-07 MED ORDER — LEVETIRACETAM IN NACL 500 MG/100ML IV SOLN
500.0000 mg | Freq: Two times a day (BID) | INTRAVENOUS | Status: DC
  Administered 2019-03-07 – 2019-03-20 (×26): 500 mg via INTRAVENOUS
  Filled 2019-03-07 (×26): qty 100

## 2019-03-07 MED ORDER — THIAMINE HCL 100 MG/ML IJ SOLN
100.0000 mg | Freq: Every day | INTRAMUSCULAR | Status: DC
  Administered 2019-03-07 – 2019-03-22 (×16): 100 mg via INTRAVENOUS
  Filled 2019-03-07 (×16): qty 2

## 2019-03-07 MED ORDER — ACETAMINOPHEN 650 MG RE SUPP: 650.0000 mg | Freq: Four times a day (QID) | RECTAL | Status: DC | PRN

## 2019-03-07 MED ORDER — ASPIRIN 300 MG RE SUPP
150.0000 mg | Freq: Every day | RECTAL | Status: DC
  Administered 2019-03-08 – 2019-03-20 (×13): 150 mg via RECTAL
  Filled 2019-03-07 (×13): qty 1

## 2019-03-07 MED ORDER — FOLIC ACID 5 MG/ML IJ SOLN
1.0000 mg | Freq: Every day | INTRAMUSCULAR | Status: DC
  Administered 2019-03-07 – 2019-03-22 (×16): 1 mg via INTRAVENOUS
  Filled 2019-03-07 (×18): qty 0.2

## 2019-03-07 MED ORDER — ENOXAPARIN SODIUM 40 MG/0.4ML ~~LOC~~ SOLN
40.0000 mg | SUBCUTANEOUS | Status: DC
  Administered 2019-03-08 – 2019-03-13 (×6): 40 mg via SUBCUTANEOUS
  Filled 2019-03-07 (×7): qty 0.4

## 2019-03-07 MED ORDER — SODIUM CHLORIDE 0.9 % IV BOLUS
1000.0000 mL | Freq: Once | INTRAVENOUS | Status: AC
  Administered 2019-03-07: 20:00:00 1000 mL via INTRAVENOUS

## 2019-03-07 MED ORDER — SODIUM CHLORIDE 0.9 % IV SOLN
INTRAVENOUS | Status: AC
  Administered 2019-03-07 – 2019-03-08 (×2): via INTRAVENOUS

## 2019-03-07 MED ORDER — ONDANSETRON HCL 4 MG/2ML IJ SOLN
4.0000 mg | Freq: Four times a day (QID) | INTRAMUSCULAR | Status: DC | PRN
  Administered 2019-03-16: 09:00:00 4 mg via INTRAVENOUS
  Filled 2019-03-07 (×2): qty 2

## 2019-03-07 MED ORDER — IPRATROPIUM-ALBUTEROL 0.5-2.5 (3) MG/3ML IN SOLN
3.0000 mL | Freq: Four times a day (QID) | RESPIRATORY_TRACT | Status: DC
  Administered 2019-03-08 (×4): 3 mL via RESPIRATORY_TRACT
  Filled 2019-03-07 (×4): qty 3

## 2019-03-07 MED ORDER — SODIUM CHLORIDE 0.9% FLUSH
3.0000 mL | Freq: Two times a day (BID) | INTRAVENOUS | Status: DC
  Administered 2019-03-08 – 2019-03-22 (×22): 3 mL via INTRAVENOUS

## 2019-03-07 MED ORDER — ONDANSETRON HCL 4 MG PO TABS: 4.0000 mg | ORAL_TABLET | Freq: Four times a day (QID) | ORAL | Status: DC | PRN

## 2019-03-07 NOTE — ED Triage Notes (Signed)
Pt BIB caregiver, she reports pt has had excessive drooling and has been unable to swallow since Friday, after eating a breakfast bowl from [employer]. Hx of stroke, has been on thickened liquids and special diet since.
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
## 2019-03-07 NOTE — ED Notes (Signed)
Attempted to call report.  Nurse unavailable. 

## 2019-03-07 NOTE — H&P (Addendum)
Date: 03/07/2019               Patient Name:  Miguel [name] MRN: [id number]  DOB: 07-28-1951 Age / Sex: 68 y.o., male   PCP: Hoy RegisterNewlin, Enobong, MD         Medical Service: Internal Medicine Teaching Service         Attending Physician: Dr. Inez CatalinaMullen, Emily B, MD    First Contact: Dr. Yetta BarreJones Pager: [phone]  Second Contact: Dr. Frances FurbishWinfrey Pager: [phone]        After Hours (After 5p/  First Contact Pager: [phone]  weekends / holidays): Second Contact Pager: [phone]   Chief Complaint: dysphagia  History of Present Illness: This is a 68 year old male presents the ED for 2-day history of dysphasia.  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  Due to dysphasia secondary to CVA in May, patient was unable to contribute to history. Spoke with patient's daughter, Archie Pattenonya, who notes that his symptoms began on Friday after feeding him a breakfast bowl. He coughed a little at that time however did not appear to be choking. Later that evening, while giving patient his evening pills, patient appeared to be unable to swallow and began drooling. Again, did he cough a little at that time. Symptoms continued through today, resulting him bringing him into the ED. She notes that he has been unable to take any of his medications or anything else PO over the past 2 days due to this dysphagia. She endorses constant drooling throughout this time. She denies any recent fever, chills, n/v/d or constipation. She denied any recent changes in strength or otherwise. She denies any other recent issues with swallowing. It appears that he was placed on a phase 1 diet per SLP recs.  Patient was able to deny having any pain at the time of exam. Patient's daughter notes that patient is unable to verbally communicate at baseline. He is generally able to walk around independently within the confines of the house.    Meds:   Current Meds  Medication Sig  . amLODipine (NORVASC) 5 MG tablet Take 1 tablet (5 mg total) by mouth daily.  Marland Kitchen. aspirin 81 MG EC tablet Take 1 tablet (81 mg total) by mouth daily.  Marland Kitchen. atorvastatin (LIPITOR) 80 MG tablet Take 1 tablet (80 mg total) by mouth daily at 6 PM.  . folic acid (FOLVITE) 1 MG tablet Take 1 tablet (1 mg total) by mouth daily.  Marland Kitchen. levETIRAcetam (KEPPRA) 500 MG tablet Take 1 tablet (500 mg total) by mouth 2 (two) times daily.  . Multiple Vitamin (MULTIVITAMIN WITH MINERALS) TABS tablet Take 1 tablet by mouth daily. (Patient taking differently: Take 1 tablet by mouth daily. Centrum Silver)     Allergies: Allergies as of 03/07/2019  . (No Known Allergies)   Past Medical History:  Diagnosis Date  . Alcohol abuse   . Atrial fibrillation ([facility])   . Closed head injury 05/2006   hx/notes 11/01/2009  . DVT (deep venous thrombosis) ([facility]) 06/2006   [name]/notes 10/19/2009  . Heart murmur   . Hypertension   . Post-traumatic hydrocephalus ([facility]) 11/2006   [name]/notes 11/28/2010  . Seizures ([facility])    [name]/notes 10/19/2009  . Stroke [facility]) 09/2009   w/right sided weakness/notes 10/31/2009    Family History: father-CVA, kidney disease  Social History: patient currently resides at home. 48 pack year smoking hx.  Review of Systems: Unable  to be obtained due to patient's dysphasia secondary to stroke/dysphasia  Physical Exam: Blood pressure 117/78, pulse (!) 50, temperature 98.4 F (36.9 C), temperature source Oral, resp. rate 18, SpO2 100 %.  GENERAL: cachetic, chronically ill appearing. HEENT: head atraumatic. No conjunctival injection. Nares patent. Tongue dry. VP shunt palpable along right SCM. No drooling CARDIAC: heart RRR. No peripheral edema.  PULMONARY: no crackles auscultation. Expiratory wheezes throughout. Weak cough ABDOMEN: soft. Nontender to palpation.  Nondistended.  NEURO: right facial droop. Unsymmetrical handgrip--right weaker. Able to raise both legs off the bed. Able to more  head from side to side.  SKIN: no rash or lesions on limited exam. Appears dehydrated. Extremities cold to touch PSYCH: alert. Able to nod yes and no to answer some questions  CXR: personally reviewed my interpretation is unimpressive bibasilar infiltrates. CT HEAD: no acute pathology noted  Assessment & Plan by Problem: Principal Problem:   Dysphagia Active Problems:   Hypertension   Alcohol abuse   History of smoking 30 or more pack years   Seizure disorder ([facility])   Cerebrovascular accident (CVA) due to thrombosis of right middle cerebral artery ([place])   Dehydration  #1. Dysphagia. Hx of CVA in May of 2020. Imaging at that time revealed infarcts involving posterior limb of right internal capsule. Stroke resulted in significant motor impairment and dysphagia. Pt was discharged with Plavix x3w and asprin. Pt has home health and has been working with therapy. -03/07/2019 CT head-no acute changes -failed bedside swallow eval -03/07/2019 CXR: reports indicated bibasilar infiltrates however this is not very impressive upon my personal review. Additionally, VSS--no hypoxia. Labs do not reveal white count and remainder of labs are unremarkable. This does not appear to have infectious component at this time. Will hold off on antibiotics at this time but will have low threshold for starting them should patient develop s/s of infection.  -may need to consider endoscopy if further drooling is observed as this could be indicative of esophogeal obstruction -formal bedside eval ordered. May need to consider PEG placement if this is patient's baseline.  -will repeat CXR in AM  -repeat labs in AM -NPO -Aspiration precautions -PT/OT/ST  2. Dehydration. Slight increase in renal function labs.  -IVF 9mL/hr -repeat BMP in am  3.  48 pack year smoking history. On exam, patient has expiratory wheezes. -duoneb ordered -would likely benefit from COPD workup  2. Hypertension. Hyperlipidemia -hold  amlodipine 5mg  -hold 80mg  lipitor -continue asprin  3. Seizure disorder.  -continue keppra 500mg  BID -seizure precautions  2. Atrial fibrillation. Patient was previously on xarelto however it appears that patient was non compliant with this so it was subsequently discontinued. -continue aspirin 81mg   3. History of ETOH abuse. Unclear if he is currently using alcohol.  -CIWA    CODE STATUS: FULL CODE Diet: NPO IV fluids: 53mL/hr DVT for prophylaxis: lovenox Lines: peripheral IV Social considerations: resides with daughter, [name]. Her number is [phone]. Dispo: Admit patient to Inpatient with expected length of stay greater than 2 midnights.  Signed: Mitzi Hansen, MD 03/07/2019, 9:17 PM  Pager: [phone]
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
## 2019-03-08 ENCOUNTER — Inpatient Hospital Stay (HOSPITAL_COMMUNITY): Payer: Medicare Other

## 2019-03-08 DIAGNOSIS — E43 Unspecified severe protein-calorie malnutrition: Secondary | ICD-10-CM | POA: Insufficient documentation

## 2019-03-08 DIAGNOSIS — Z87891 Personal history of nicotine dependence: Secondary | ICD-10-CM

## 2019-03-08 DIAGNOSIS — I69392 Facial weakness following cerebral infarction: Secondary | ICD-10-CM

## 2019-03-08 DIAGNOSIS — G40909 Epilepsy, unspecified, not intractable, without status epilepticus: Secondary | ICD-10-CM

## 2019-03-08 DIAGNOSIS — E785 Hyperlipidemia, unspecified: Secondary | ICD-10-CM

## 2019-03-08 DIAGNOSIS — Z7982 Long term (current) use of aspirin: Secondary | ICD-10-CM

## 2019-03-08 DIAGNOSIS — I4891 Unspecified atrial fibrillation: Secondary | ICD-10-CM

## 2019-03-08 DIAGNOSIS — Z982 Presence of cerebrospinal fluid drainage device: Secondary | ICD-10-CM

## 2019-03-08 DIAGNOSIS — R1310 Dysphagia, unspecified: Secondary | ICD-10-CM

## 2019-03-08 DIAGNOSIS — R7989 Other specified abnormal findings of blood chemistry: Secondary | ICD-10-CM

## 2019-03-08 DIAGNOSIS — I10 Essential (primary) hypertension: Secondary | ICD-10-CM

## 2019-03-08 DIAGNOSIS — I69391 Dysphagia following cerebral infarction: Secondary | ICD-10-CM

## 2019-03-08 DIAGNOSIS — E860 Dehydration: Secondary | ICD-10-CM

## 2019-03-08 DIAGNOSIS — I69354 Hemiplegia and hemiparesis following cerebral infarction affecting left non-dominant side: Secondary | ICD-10-CM

## 2019-03-08 DIAGNOSIS — Z87820 Personal history of traumatic brain injury: Secondary | ICD-10-CM

## 2019-03-08 DIAGNOSIS — F1010 Alcohol abuse, uncomplicated: Secondary | ICD-10-CM

## 2019-03-08 DIAGNOSIS — Z79899 Other long term (current) drug therapy: Secondary | ICD-10-CM

## 2019-03-08 LAB — BASIC METABOLIC PANEL
Anion gap: 14 (ref 5–15)
BUN: 17 mg/dL (ref 8–23)
CO2: 20 mmol/L — ABNORMAL LOW (ref 22–32)
Calcium: 8.8 mg/dL — ABNORMAL LOW (ref 8.9–10.3)
Chloride: 109 mmol/L (ref 98–111)
Creatinine, Ser: 0.99 mg/dL (ref 0.61–1.24)
GFR calc Af Amer: >60 mL/min (ref >60)
GFR calc non Af Amer: >60 mL/min (ref >60)
Glucose, Bld: 71 mg/dL (ref 70–99)
Potassium: 4.3 mmol/L (ref 3.5–5.1)
Sodium: 143 mmol/L (ref 135–145)

## 2019-03-08 LAB — CBC
HCT: 44.4 % (ref 39.0–52.0)
Hemoglobin: 14.9 g/dL (ref 13.0–17.0)
MCH: 30 pg (ref 26.0–34.0)
MCHC: 33.6 g/dL (ref 30.0–36.0)
MCV: 89.5 fL (ref 80.0–100.0)
Platelets: 138 10*3/uL — ABNORMAL LOW (ref 150–400)
RBC: 4.96 MIL/uL (ref 4.22–5.81)
RDW: 14.2 % (ref 11.5–15.5)
WBC: 7.1 10*3/uL (ref 4.0–10.5)
nRBC: 0 % (ref 0.0–0.2)

## 2019-03-08 MED ORDER — SODIUM CHLORIDE 0.9 % IV SOLN
INTRAVENOUS | Status: DC
  Administered 2019-03-08: 20:00:00 via INTRAVENOUS

## 2019-03-08 NOTE — TOC Initial Note (Signed)
Transition of Care [facility](TOC) - Initial/Assessment Note    Patient Details  Name: Miguel [name] MRN: [id number] Date of Birth: 1951/02/03  Transition of Care [facility](TOC) CM/SW Contact:    Kermit BaloKelli F Nicholai Willette, RN Phone Number: 03/08/2019, 2:11 PM  Clinical Narrative:                 CM spoke to pt's daughter: [name] over the phone. [facility] since [facility] have [facility] in the past.  Daughter is able to provide transport home when medically ready.  Expected Discharge Plan: Home w Home Health Services Barriers to Discharge: Continued Medical Work up   Patient Goals and CMS Choice   CMS Medicare.gov Compare Post Acute Care list provided to:: Patient Represenative (must comment) Choice offered to / list presented to : Adult Children(daughter)  Expected Discharge Plan and Services Expected Discharge Plan: Home w Home Health Services   Discharge Planning Services: CM Consult Post Acute Care Choice: Home Health                               [facility]        Prior Living Arrangements/Services   Lives with:: Adult Children Patient language and need for interpreter reviewed:: Yes(no needs) Do you feel safe going back to the place where you live?: Yes      Need for Family Participation in Patient Care: Yes (Comment)(24 hour supervision) Care giver support system in place?: Yes (comment)(daughter provides 24 hour supervision at home.) Current home services: DME(bed, wheelchair, 3 in 1, suction) Criminal Activity/Legal Involvement Pertinent to Current Situation/Hospitalization: No - Comment as needed  Activities of Daily Living Home Assistive Devices/Equipment: Dan HumphreysWalker (specify type) ADL Screening (condition at time of admission) Patient's cognitive ability adequate to safely complete daily activities?: Yes Is the patient deaf or have difficulty hearing?: No Does the patient have difficulty seeing, even when wearing glasses/contacts?:  No Does the patient have difficulty concentrating, remembering, or making decisions?: No Patient able to express need for assistance with ADLs?: Yes Does the patient have difficulty dressing or bathing?: No Independently performs ADLs?: Yes (appropriate for developmental age) Does the patient have difficulty walking or climbing stairs?: Yes Weakness of Legs: Both Weakness of Arms/Hands: None  Permission Sought/Granted                  Emotional Assessment Appearance:: Appears stated age         Psych Involvement: No (comment)  Admission diagnosis:  Aspiration into airway [T17.908A] Dysphagia, unspecified type [R13.10] Aspiration pneumonia, unspecified aspiration pneumonia type, unspecified laterality, unspecified part of lung (HCC) [J69.0] Patient Active Problem List   Diagnosis Date Noted  . Protein-calorie malnutrition, severe 03/08/2019  . Dysphagia 03/07/2019  . Dehydration 03/07/2019  . Cerebrovascular accident (CVA) due to thrombosis of right middle cerebral artery (HCC)   . Sinus arrhythmia   . Cerebral embolism with cerebral infarction 12/16/2018  . Left-sided weakness 12/15/2018  . Atrial fibrillation (HCC) 12/15/2018  . Seizure disorder (HCC) 10/18/2014  . Acute renal failure (ARF) (HCC) 09/07/2014  . Acute encephalopathy 09/07/2014  . Homelessness 09/07/2014  . Dilantin level too low 09/07/2014  . 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 Alcohol abuse 02/25/2013  . Dilantin toxicity 02/25/2013  . History of smoking 30 or more pack years 02/25/2013  . Noncompliance 02/25/2013  . Seizures (HCC)   . Hypertension  PCP:  Charlott Rakes, MD Pharmacy:   [id number] - [place], [place] [address] Phone: [phone] Fax: [phone]     Social Determinants of Health (SDOH) Interventions    Readmission Risk Interventions No flowsheet data found.

## 2019-03-08 NOTE — Progress Notes (Signed)
Initial Nutrition Assessment  DOCUMENTATION CODES:   Severe malnutrition in context of chronic illness  INTERVENTION:   -RD will follow for diet advancement and supplement as appropriate -If pt unable to take PO's, consider initiation of nutrition support. Recommend:  Initiate Osmolite 1.5 @ 20 ml/hr and increase by 10 ml every 8 hours to goal rate of 60 ml/hr.   30 ml Prostat daily.    If no IVFS, recommend 160 ml free water flush every 4 hours  Tube feeding regimen provides 2260 kcal (100% of needs), 105 grams of protein, and 2057 ml of H2O.   NUTRITION DIAGNOSIS:   Severe Malnutrition related to chronic illness(CVA) as evidenced by moderate fat depletion, severe fat depletion, moderate muscle depletion, severe muscle depletion.  GOAL:   Patient will meet greater than or equal to 90% of their needs  MONITOR:   Diet advancement, Labs, Weight trends, Skin, I & O's  REASON FOR ASSESSMENT:   Malnutrition Screening Tool    ASSESSMENT:   68 year old male presents the ED for 2-day history of dysphasia.  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  Pt admitted with dysphagia and dehydration.   Pt sitting up in bed at time of visit. He is unable to participate in interview or provide additional history.   Per H&P, pt is on a dysphagia 2 diet with nectar thick liquids at baseline. He has had more difficulty with dysphagia over the past few days per daughter's report.   Reviewed wt hx, which revealed pt has experienced a 7.1 % wt loss over the past 3 months. While this is not significant for time frame, it is still concerning given pt's history of dysphagia and severe fat and muscle depletions.   Pt s/p BSE with SLP earlier this morning and is recommending current NPO status. Plan for MBSS later on today to assess for ability to advance diet.   Medications reviewed and  include thiamine and folic acid.   Labs reviewed.   NUTRITION - FOCUSED PHYSICAL EXAM:    Most Recent Value  Orbital Region  Severe depletion  Upper Arm Region  Moderate depletion  Thoracic and Lumbar Region  Moderate depletion  Buccal Region  Severe depletion  Temple Region  Severe depletion  Clavicle Bone Region  Moderate depletion  Clavicle and Acromion Bone Region  Moderate depletion  Scapular Bone Region  Moderate depletion  Dorsal Hand  Severe depletion  Patellar Region  Severe depletion  Anterior Thigh Region  Severe depletion  Posterior Calf Region  Severe depletion  Edema (RD Assessment)  None  Hair  Reviewed  Eyes  Reviewed  Mouth  Reviewed  Skin  Reviewed  Nails  Reviewed       Diet Order:   Diet Order            Diet NPO time specified  Diet effective now              EDUCATION NEEDS:   Not appropriate for education at this time  Skin:  Skin Assessment: Reviewed RN Assessment  Last BM:  PTA  Height:   Ht Readings from Last 1 Encounters:  12/15/18 6' (1.829 m)    Weight:   Wt Readings from Last 1 Encounters:  03/07/19 66.5 kg    Ideal Body Weight:  80.9 kg  BMI:  Body mass index is 19.88 kg/m.  Estimated Nutritional Needs:   Kcal:  2000-2200  Protein:  100-115 grams  Fluid:  > 2.0 L    Toluwanimi Radebaugh A. Jimmye Norman, RD, LDN, [place] Registered Dietitian II Certified Diabetes Care and Education Specialist Pager: [phone] After hours Pager: [phone]

## 2019-03-08 NOTE — Evaluation (Addendum)
Occupational Therapy Evaluation Patient Details Name: Miguel [name] MRN: [id number] DOB: Nov 28, 1950 Today's Date: 03/08/2019    History of Present Illness 68 year old male presents the ED for 2-day history of dysphasia.  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   Clinical Impression   P{t admitted with above diagnoses, decreased activity tolerance and generalized weakness from dx listed above limiting ability to participate in BADL at desired level of ind. Hx taken from chart review and per dtr whom PT spoke with via phone. PTA pt completed household functional mobility. He is nonverbal at baseline, but seems to respond appropriately to yes/no questioning. Provided pt with paper and pen, he was able to write his name with L hand when cued. Suggested communication board/white board for increased ind from pt to communicate needs. He shares that he dresses himself but has assist with bathing IADL activity. At time of evaluation pt is min A for bed mobility, mod A to power to stand, and min A with RW. HE is able to complete grooming at set up A level EOB. At this time, recommend pt return home with HHOT to maintain safety and ind in home environment with dtr continuing to provide 24/7 assist (dtr in agreement). Will benefit from acute OT to address following deficits listed below.    Follow Up Recommendations  Home health OT;Supervision/Assistance - 24 hour    Equipment Recommendations  None recommended by OT    Recommendations for Other Services       Precautions / Restrictions Precautions Precautions: Fall;Other (comment) Precaution Comments: non-verbal at baseline due to previous CVA; communicates with yes/no      Mobility Bed Mobility Overal bed mobility: Needs Assistance Bed Mobility: Supine to Sit     Supine to sit: Min assist     General bed mobility comments: increased  time, cues for sequencing  Transfers Overall transfer level: Needs assistance Equipment used: Rolling walker (2 wheeled) Transfers: Sit to/from Stand Sit to Stand: Mod assist         General transfer comment: mod A to power up, cueing for hand placement    Balance Overall balance assessment: Needs assistance Sitting-balance support: No upper extremity supported;Feet supported Sitting balance-Leahy Scale: Fair     Standing balance support: Bilateral upper extremity supported;During functional activity Standing balance-Leahy Scale: Poor Standing balance comment: reliant on RW/ external support                           ADL either performed or assessed with clinical judgement   ADL Overall ADL's : Needs assistance/impaired Eating/Feeding: NPO   Grooming: Set up;Sitting;Wash/dry face Grooming Details (indicate cue type and reason): sitting EOB Upper Body Bathing: Set up;Sitting   Lower Body Bathing: Minimal assistance;Sit to/from stand;Sitting/lateral leans   Upper Body Dressing : Set up;Sitting   Lower Body Dressing: Minimal assistance;Sit to/from stand;Sitting/lateral leans Lower Body Dressing Details (indicate cue type and reason): to don socks Toilet Transfer: Moderate assistance;Minimal assistance;BSC;RW   Toileting- Clothing Manipulation and Hygiene: Minimal assistance;Sit to/from stand;Sitting/lateral lean   Tub/ Shower Transfer: Minimal assistance;Moderate assistance;Shower seat;Rolling walker   Functional mobility during ADLs: Minimal assistance;Rolling walker General ADL Comments: pt generally needing set up-min A for global BADL engagement     Vision Baseline Vision/History: No visual deficits       Perception     Praxis      Pertinent Vitals/Pain Pain  Assessment: Faces Faces Pain Scale: No hurt     Hand Dominance Left   Extremity/Trunk Assessment Upper Extremity Assessment Upper Extremity Assessment: Generalized weakness;LUE  deficits/detail LUE Deficits / Details: LUE reported weaker in chart review, but pt initiating writing with this extremity LUE Coordination: decreased fine motor;decreased gross motor   Lower Extremity Assessment Lower Extremity Assessment: Generalized weakness   Cervical / Trunk Assessment Cervical / Trunk Assessment: Kyphotic   Communication Communication Communication: Expressive difficulties   Cognition Arousal/Alertness: Awake/alert Behavior During Therapy: Flat affect Overall Cognitive Status: Difficult to assess                                 General Comments: pt is nonverbal at baseline, was following yes/no commands appopriately and wrote name when cued to do so   General Comments       Exercises     Shoulder Instructions      Home Living Family/patient expects to be discharged to:: Private residence Living Arrangements: Children(dtr) Available Help at Discharge: Available 24 hours/day;Family Type of Home: Apartment Home Access: Stairs to enter [employer] of Steps: 2 Entrance Stairs-Rails: Right;Left Home Layout: One level     Bathroom Shower/Tub: Teacher, early years/pre: Standard     Home Equipment: Cane - single point;Walker - 2 wheels;Bedside commode;Wheelchair - manual;Hospital bed;Other (comment)(hoyer)   Additional Comments: home set up/hx taken per PT who spole with pt dtr over phone      Prior Functioning/Environment Level of Independence: Needs assistance  Gait / Transfers Assistance Needed: uses RW around house ADL's / Homemaking Assistance Needed: dtr assist with bathing and home maintaining/meals. Pt states he does dress himself Communication / Swallowing Assistance Needed: non-verbal Comments: Daughter reports no falls at home.        OT Problem List: Decreased strength;Decreased knowledge of use of DME or AE;Decreased coordination;Decreased activity tolerance;Decreased range of motion;Decreased  cognition;Impaired UE functional use;Impaired balance (sitting and/or standing)      OT Treatment/Interventions: Self-care/ADL training;Therapeutic exercise;Patient/family education;Neuromuscular education;Balance training;Energy conservation;Therapeutic activities;DME and/or AE instruction;Cognitive remediation/compensation    OT Goals(Current goals can be found in the care plan section) Acute Rehab OT Goals Patient Stated Goal: unable to verbalize OT Goal Formulation: With patient Time For Goal Achievement: 03/22/19 Potential to Achieve Goals: Good  OT Frequency: Min 2X/week   Barriers to D/C:            Co-evaluation PT/OT/SLP Co-Evaluation/Treatment: Yes Reason for Co-Treatment: To address functional/ADL transfers;Necessary to address cognition/behavior during functional activity PT goals addressed during session: Mobility/safety with mobility;Balance;Proper use of DME OT goals addressed during session: ADL's and self-care      AM-PAC OT "6 Clicks" Daily Activity     Outcome Measure Help from another person eating meals?: Total(NPO) Help from another person taking care of personal grooming?: None Help from another person toileting, which includes using toliet, bedpan, or urinal?: A Little Help from another person bathing (including washing, rinsing, drying)?: A Little Help from another person to put on and taking off regular upper body clothing?: None Help from another person to put on and taking off regular lower body clothing?: A Little 6 Click Score: 18   End of Session Equipment Utilized During Treatment: Gait belt;Rolling walker Nurse Communication: Mobility status  Activity Tolerance: Patient tolerated treatment well Patient left: in chair;with call bell/phone within reach;with chair alarm set  OT Visit Diagnosis: Other abnormalities of gait and mobility (R26.89);Muscle  weakness (generalized) (M62.81);Cognitive communication deficit (R41.841);Other symptoms and signs  involving cognitive function                Time: 0933-1010 OT Time Calculation (min): 37 min Charges:  OT General Charges $OT Visit: 1 Visit OT Evaluation $OT Eval Moderate Complexity: 1 Mod  Dalphine HandingKaylee Kerly Rigsbee, MSOT, OTR/L Behavioral Health OT/ Acute Relief OT0 [facility] Office: [phone]   Dalphine HandingKaylee Dyamon Sosinski 03/08/2019, 11:20 AM

## 2019-03-08 NOTE — Progress Notes (Signed)
Subjective: Pt seen at the bedside this morning. Non-verbal but shakes head to answer questions. Denies chest pain or pressure, shortness of breath, nausea, or abdominal pain.  Objective:  Vital signs in last 24 hours: Vitals:   03/08/19 0101 03/08/19 0115 03/08/19 0355 03/08/19 0744  BP: 127/88  (!) 163/80   Pulse: 91  72   Resp: 18  18   Temp: 98.1 F (36.7 C)  97.6 F (36.4 C)   TempSrc: Oral  Oral   SpO2: 97% 98% 97% 96%  Weight:       Physical Exam Constitutional:      Appearance: He is ill-appearing (chronically).  HENT:     Mouth/Throat:     Mouth: Mucous membranes are dry.     Comments: No drooling Cardiovascular:     Rate and Rhythm: Normal rate and regular rhythm.     Heart sounds: Normal heart sounds. No murmur. No friction rub. No gallop.   Pulmonary:     Effort: Pulmonary effort is normal. No respiratory distress.     Breath sounds: Transmitted upper airway sounds present. No wheezing, rhonchi or rales.  Abdominal:     General: Abdomen is flat. Bowel sounds are normal.     Tenderness: There is no abdominal tenderness.  Musculoskeletal:     Right lower leg: No edema.     Left lower leg: No edema.  Neurological:     Mental Status: He is alert.     Cranial Nerves: Facial asymmetry (R facial droop) present.     Comments: Able to move all extremities though strength asymmetric strength (L weaker than R). Contractures on L side.     Assessment/Plan: Mr. Miguel [name] is a 68 year old male who presented with a 2-day history of dysphasia after possible aspiration event at home. 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 Pt has residual deficits of R facial droop and L hemiparesis. Had been on diet of dysphagia 2 (chopped)/nectar-thickened liquids, and has home health therapy.  Acute on chronic dysphagia Questionable adherence to dysphagia diet at home. CT head negative for acute changes, no new neuro deficits  making new stroke less likely. Bibasilar infiltrates on CXR in the ED which were not present on repeat. Pt not requiring supplemental oxygen, no leukocytosis, and afebrile - not appearing to be an aspiration pneumonia so not on antibiotics at this time. - failed bedside swallow - formal speech eval today  - question if pt can manage oral secretions at this time  - NPO   - f/u for PO readiness and potential repeat modified barium swallow - could consider endoscopy if drooling and concern for esophogeal obstruction - could consider PEG placement if pt unable to swallow long-term  - aspiration precautions - PT/OT   Malnutrition Type: Nutrition Problem: Severe Malnutrition Etiology: chronic illness(CVA) Malnutrition Characteristics: Signs/Symptoms: moderate fat depletion, severe fat depletion, moderate muscle depletion, severe muscle depletion Nutrition Interventions: Interventions: Refer to RD note for recommendations   Seizure disorder -continue keppra 500mg  BID IV (while pt NPO) -seizure precautions  History of atrial fibrillation per chart - not seen on EKG Pt previously on xarelto however is currently discontinued -continue aspirin 150mg  suppository  Hypertension  -hold home amlodipine 5mg    Hyperlipidemia -hold home lipitor 80mg   History of ETOH abuse Unclear if he is currently using alcohol.  -CIWA    Diet - NPO pending speech f/u and potential MBS Fluids - none DVT ppx - enoxaparin  40mg  subQ daily CODE STATUS - FULL CODE   Dispo: Anticipated discharge in approximately 1-2 day(s) pending barium swallow.  Live with daughter, [name] - [phone].   Ladona Horns, MD 03/08/2019, 7:51 AM Pager: [phone]

## 2019-03-08 NOTE — Evaluation (Addendum)
Physical Therapy Evaluation Patient Details Name: Miguel [name] MRN: [id number] DOB: Apr 17, 1951 Today's Date: 03/08/2019   History of Present Illness  68 year old male presents the ED for 2-day history of dysphasia.  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    Clinical Impression  Pt admitted with above diagnosis. Pt currently with functional limitations due to the deficits listed below (see PT Problem List). One eval, pt required min assist bed mobility, mod assist sit to stand, and min assist ambulation 5 feet with RW. Pt will benefit from skilled PT to increase their independence and safety with mobility to allow discharge to the venue listed below.  Spoke with pt's daughter, Miguel [name], via phone. She prefers pt return home with [facility] services. She is able to provide needed level of assist. Pt has all needed home DME.      Follow Up Recommendations Home health PT;Supervision/Assistance - 24 hour    Equipment Recommendations  None recommended by PT    Recommendations for Other Services       Precautions / Restrictions Precautions Precautions: Fall;Other (comment) Precaution Comments: non-verbal at baseline due to previous CVA      Mobility  Bed Mobility Overal bed mobility: Needs Assistance Bed Mobility: Supine to Sit     Supine to sit: Min assist;HOB elevated     General bed mobility comments: +rail, cues for sequencing, increased time  Transfers Overall transfer level: Needs assistance Equipment used: Rolling walker (2 wheeled) Transfers: Sit to/from Stand Sit to Stand: Mod assist         General transfer comment: cues for hand placement, assist to power up  Ambulation/Gait Ambulation/Gait assistance: Min assist Gait Distance (Feet): 5 Feet Assistive device: Rolling walker (2 wheeled) Gait Pattern/deviations: Step-through pattern;Decreased stride length Gait  velocity: decreased   General Gait Details: Pt agreeable to ambulation to recliner only.  Stairs            Wheelchair Mobility    Modified Rankin (Stroke Patients Only)       Balance Overall balance assessment: Needs assistance Sitting-balance support: No upper extremity supported;Feet supported Sitting balance-Leahy Scale: Fair     Standing balance support: Bilateral upper extremity supported;During functional activity Standing balance-Leahy Scale: Poor Standing balance comment: reliant on RW                             Pertinent Vitals/Pain Pain Assessment: Faces Faces Pain Scale: No hurt    Home Living Family/patient expects to be discharged to:: Private residence Living Arrangements: Children(daughter) Available Help at Discharge: Available 24 hours/day;Family Type of Home: Apartment Home Access: Stairs to enter Entrance Stairs-Rails: Right;Left Entrance Stairs-Number of Steps: 2 Home Layout: One level Home Equipment: Cane - single point;Walker - 2 wheels;Bedside commode;Wheelchair - manual;Hospital bed;Other (comment)(hoyer lift) Additional Comments: Home set up/history taken from pt's daughter, Miguel [name], via phone.    Prior Function Level of Independence: Needs assistance   Gait / Transfers Assistance Needed: minimal ambulation in the house with RW  ADL's / Homemaking Assistance Needed: daughter assists with all ADLs  Comments: Daughter reports no falls at home.     Hand Dominance   Dominant Hand: Left    Extremity/Trunk Assessment   Upper Extremity Assessment Upper Extremity Assessment: Defer to OT evaluation    Lower Extremity Assessment Lower Extremity Assessment: Generalized weakness(weakness L>R)    Cervical / Trunk Assessment Cervical /  Trunk Assessment: Kyphotic  Communication   Communication: Expressive difficulties  Cognition Arousal/Alertness: Awake/alert Behavior During Therapy: Flat affect Overall Cognitive Status:  Difficult to assess                                 General Comments: Appropriate. Follows commands. Appears withdrawn.      General Comments      Exercises     Assessment/Plan    PT Assessment Patient needs continued PT services  PT Problem List Decreased strength;Decreased mobility;Decreased activity tolerance;Decreased balance       PT Treatment Interventions Therapeutic activities;Gait training;Therapeutic exercise;Patient/family education;Balance training;Stair training;Functional mobility training    PT Goals (Current goals can be found in the Care Plan section)  Acute Rehab PT Goals Patient Stated Goal: home per daughter PT Goal Formulation: With patient/family Time For Goal Achievement: 03/22/19 Potential to Achieve Goals: Good    Frequency Min 3X/week   Barriers to discharge        Co-evaluation PT/OT/SLP Co-Evaluation/Treatment: Yes Reason for Co-Treatment: To address functional/ADL transfers;Necessary to address cognition/behavior during functional activity PT goals addressed during session: Mobility/safety with mobility;Balance;Proper use of DME         AM-PAC PT "6 Clicks" Mobility  Outcome Measure Help needed turning from your back to your side while in a flat bed without using bedrails?: None Help needed moving from lying on your back to sitting on the side of a flat bed without using bedrails?: A Little Help needed moving to and from a bed to a chair (including a wheelchair)?: A Little Help needed standing up from a chair using your arms (e.g., wheelchair or bedside chair)?: A Little Help needed to walk in hospital room?: A Little Help needed climbing 3-5 steps with a railing? : A Lot 6 Click Score: 18    End of Session Equipment Utilized During Treatment: Gait belt Activity Tolerance: Patient tolerated treatment well Patient left: in chair;with chair alarm set;with call bell/phone within reach Nurse Communication: Mobility  status PT Visit Diagnosis: Other abnormalities of gait and mobility (R26.89);Muscle weakness (generalized) (M62.81)    Time: [id number] PT Time Calculation (min) (ACUTE ONLY): 21 min   Charges:   PT Evaluation $PT Eval Moderate Complexity: 1 Mod          Aida RaiderWendy Raymondo Garcialopez, PT  Office # [phone] Pager [phone]   Ilda FoilGarrow, Zyquan Crotty Rene 03/08/2019, 9:56 AM
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
## 2019-03-09 DIAGNOSIS — Z8679 Personal history of other diseases of the circulatory system: Secondary | ICD-10-CM

## 2019-03-09 MED ORDER — DEXTROSE-NACL 5-0.2 % IV SOLN
INTRAVENOUS | Status: DC
  Administered 2019-03-09 – 2019-03-19 (×15): via INTRAVENOUS
  Filled 2019-03-09 (×3): qty 1000

## 2019-03-09 MED ORDER — IPRATROPIUM-ALBUTEROL 0.5-2.5 (3) MG/3ML IN SOLN
3.0000 mL | Freq: Two times a day (BID) | RESPIRATORY_TRACT | Status: DC
  Administered 2019-03-09 – 2019-03-20 (×20): 3 mL via RESPIRATORY_TRACT
  Filled 2019-03-09 (×23): qty 3

## 2019-03-09 MED ORDER — IPRATROPIUM-ALBUTEROL 0.5-2.5 (3) MG/3ML IN SOLN: 3.0000 mL | Freq: Four times a day (QID) | RESPIRATORY_TRACT | Status: DC | PRN

## 2019-03-09 NOTE — Progress Notes (Signed)
Physical Therapy Treatment Patient Details Name: Miguel DohenyRay J [name] MRN: [id number] DOB: 03-17-1951 Today's Date: 03/09/2019    History of Present Illness 68 year old male presents the ED for 2-day history of dysphasia.  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    PT Comments    Pt did longer walk today - worked on Field seismologistsafety with RW and wider base.  Pt tired after 50 feet walk.  Pt cooperative.  Follow Up Recommendations  Home health PT;Supervision/Assistance - 24 hour     Equipment Recommendations  None recommended by PT    Recommendations for Other Services       Precautions / Restrictions      Mobility  Bed Mobility               General bed mobility comments: pt was sitting in chair on arrival  Transfers Overall transfer level: Needs assistance Equipment used: Rolling walker (2 wheeled) Transfers: Sit to/from Stand Sit to Stand: Min guard;Min assist         General transfer comment: min to mod A to power up from recliner, cueing for hand placement  Ambulation/Gait Ambulation/Gait assistance: Min assist Gait Distance (Feet): 50 Feet Assistive device: Rolling walker (2 wheeled) Gait Pattern/deviations: Step-to pattern;Decreased step length - left;Decreased stance time - left;Narrow base of support;Trunk flexed     General Gait Details: Pt walked with RW - h e was able to maneuver RW but harder when turning.  pt educated on keeping feet inside Rw to turn.  pt educated on maintaining wider base when turning.  pt with step to gait - cued for longer step on left leg but this is hard for pt.  pt tired and more flexed at end of walk - he agreed he was tired.  I am unsure how much walking he does at home.   Stairs             Wheelchair Mobility    Modified Rankin (Stroke Patients Only)       Balance Overall balance assessment: Needs assistance              Standing balance comment: reliant on RW/ external support.  Educated pt on keeping wider base of support when standing for improved stability                            Cognition Arousal/Alertness: Awake/alert Behavior During Therapy: Flat affect Overall Cognitive Status: Difficult to assess                                 General Comments: pt is nonverbal at baseline, was following yes/no commands appropriately.  he would occasionally nod yes and no      Exercises      General Comments        Pertinent Vitals/Pain Pain Assessment: No/denies pain Faces Pain Scale: No hurt    Home Living                      Prior Function            PT Goals (current goals can now be found in the care plan section) Progress towards PT goals: Progressing toward goals    Frequency    Min 3X/week      PT Plan Current plan  remains appropriate    Co-evaluation              AM-PAC PT "6 Clicks" Mobility   Outcome Measure  Help needed turning from your back to your side while in a flat bed without using bedrails?: None Help needed moving from lying on your back to sitting on the side of a flat bed without using bedrails?: A Little Help needed moving to and from a bed to a chair (including a wheelchair)?: A Little Help needed standing up from a chair using your arms (e.g., wheelchair or bedside chair)?: A Little Help needed to walk in hospital room?: A Little Help needed climbing 3-5 steps with a railing? : Total 6 Click Score: 17    End of Session Equipment Utilized During Treatment: Gait belt Activity Tolerance: Patient tolerated treatment well Patient left: in chair;with chair alarm set;with call bell/phone within reach Nurse Communication: Mobility status PT Visit Diagnosis: Other abnormalities of gait and mobility (R26.89);Muscle weakness (generalized) (M62.81)     Time: 1740-8144 PT Time Calculation (min) (ACUTE  ONLY): 20 min  Charges:  $Gait Training: 8-22 mins                     03/09/2019   Rande Lawman, PT    Miguel [name] 03/09/2019, 1:17 PM

## 2019-03-09 NOTE — Progress Notes (Signed)
   Subjective: Pt seen this morning, sitting up in the chair working with speech therapy. Writes "I feel all right".  Objective:  Vital signs in last 24 hours: Vitals:   03/08/19 1949 03/08/19 2034 03/08/19 2359 03/09/19 0404  BP: (!) 155/84  (!) 159/77 133/76  Pulse: 67 74 68 62  Resp: 18 16 16 16   Temp: 98.1 F (36.7 C)  98.7 F (37.1 C) 98.1 F (36.7 C)  TempSrc: Oral  Oral Oral  SpO2: 94%  96% 97%  Weight:       Physical Exam Vitals signs reviewed.  Constitutional:      General: He is not in acute distress.    Comments: Non-verbal, shakes head yes/no and writes on bedside whiteboard  HENT:     Mouth/Throat:     Comments: Drooling  Cardiovascular:     Rate and Rhythm: Normal rate and regular rhythm.     Heart sounds: Normal heart sounds. No murmur. No friction rub. No gallop.   Pulmonary:     Effort: Pulmonary effort is normal. No respiratory distress.     Breath sounds: Wheezing present.  Abdominal:     General: Bowel sounds are normal. There is no distension.     Palpations: Abdomen is soft.     Tenderness: There is no abdominal tenderness.  Skin:    General: Skin is warm and dry.  Neurological:     Mental Status: He is alert.     Cranial Nerves: Facial asymmetry (R facial droop) present.     Comments: Moves all extremities against gravity (L side weaker than R)     Assessment/Plan: Miguel [name] is a 68 year old male who presented with a 2-day history of dysphasia after possible aspiration event at home. 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 Pt has residual deficits of R facial droop and L hemiparesis. Had been on diet of dysphagia 2 (chopped)/nectar-thickened liquids.  Acute on chronic dysphagia No evidence for previous aspiration pneumonia at this time - not requiring supplemental oxygen, no leukocytosis, and afebrile - formal speech eval today  - question if pt can manage oral secretions at this  time  - NPO   - repeat swallow study tomorrow - no urgent need for NG tube placement - could continue goals of care discussions and consider PEG placement in the future if pt unable to swallow long-term  - aspiration precautions - continue PT/OT - recommending home health   Nutrition consulted, appreciate their recommendations. Malnutrition Type: Nutrition Problem: Severe Malnutrition Etiology: chronic illness(CVA) Malnutrition Characteristics: Signs/Symptoms: moderate fat depletion, severe fat depletion, moderate muscle depletion, severe muscle depletion Nutrition Interventions: Interventions: Refer to RD note for recommendations   Seizure disorder -continue keppra 500mg  BID IV (while pt NPO) -seizure precautions  History of atrial fibrillation per chart - not seen on EKG Pt previously on xarelto however is currently discontinued -continue aspirin 150mg  suppository  Hypertension  -hold home amlodipine 5mg    Hyperlipidemia -hold home lipitor 80mg   History of ETOH abuse Unclear if he is currently using alcohol.  -CIWA    Diet - NPO pending speech evaluation Fluids - 63mL D5 1/2NS DVT ppx - enoxaparin 40mg  subQ daily CODE STATUS - FULL CODE   Dispo: Anticipated discharge in approximately 1-2 day(s) pending swallow study.  Live with daughter, Miguel [name] - [phone].   Ladona Horns, MD 03/09/2019, 7:23 AM Pager: [phone]

## 2019-03-09 NOTE — Progress Notes (Signed)
  Speech Language Pathology Treatment: Dysphagia  Patient Details Name: Miguel [name] MRN: [id number] DOB: 03/04/1951 Today's Date: 03/09/2019 Time: 1025-1100 SLP Time Calculation (min) (ACUTE ONLY): 35 min  Assessment / Plan / Recommendation Clinical Impression  Patient seen to address dysphagia goals with trials of nectar thick liquids and puree solids. Patient is non-verbal but consistently and accurately able to answer yes/no questions, oriented to month and year with verbal or printed choice cues and when asked to write how he was feeling, he wrote "I feel all right". SLP initiated trials of nectar thick liquids via teaspoon sips and alternated this with spoon bites of puree solids (vanilla pudding). Patient continues with anterior spillage of PO's mixed with saliva due to significant oral-motor weakness, specifically with bilabial strength and lingual strength. He was able to initiate swallow, however was delayed and very audible, with questionable swallowing of air as well as PO's. Although non-verbal, patient did intermittently vocalize which consisted of a growl, and during this, SLP observed wet vocal quality. Patient was able to feed self with cup sips of nectar thick liquids and spoon bites of puree solids after SLP setup. He self-managed saliva/PO spillage with dry washcloth, but SLP still suctioned his oral cavity to remove mostly clear saliva/secretions mixed with a small amount of PO's. Patient was educated on plan for repeat swallow study likely tomorrow. He nodded to indicate understanding, but his affect is very flat and although he seems accurate with yes/no, it was unclear if he understood all of what SLP was saying.    HPI HPI: 68 year-old male presents the ED for 2-day history of dysphasia.  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      SLP Plan  Continue with current plan  of care;MBS       Recommendations  Diet recommendations: NPO Medication Administration: Via alternative means                Oral Care Recommendations: Oral care QID Follow up Recommendations: Home health SLP;Skilled Nursing facility;Inpatient Rehab;24 hour supervision/assistance SLP Visit Diagnosis: Dysphagia, oropharyngeal phase (R13.12) Plan: Continue with current plan of care;MBS       GO                Dannial Monarch 03/09/2019, 11:15 AM    Sonia Baller, MA, CCC-SLP Speech Therapy [facility]

## 2019-03-09 NOTE — Discharge Summary (Addendum)
Name: Miguel [name] MRN: [id number] DOB: 20-Dec-1950 68 y.o. PCP: Hoy RegisterNewlin, Enobong, MD  Date of Admission: 03/07/2019  3:50 PM Date of Discharge: 03/22/2019 Attending Physician: Anne Shutteraines, Alexander N, MD  Discharge Diagnosis: 1. Dysphagia  Discharge Medications: Allergies as of 03/22/2019   No Known Allergies      Medication List     STOP taking these medications    aspirin 81 MG EC tablet Replaced by: aspirin 81 MG chewable tablet   multivitamin with minerals Tabs tablet       TAKE these medications    amLODipine 5 MG tablet Commonly known as: NORVASC Place 1 tablet (5 mg total) into feeding tube daily. What changed: how to take this   aspirin 81 MG chewable tablet Place 1 tablet (81 mg total) into feeding tube daily. Start taking on: March 23, 2019 Replaces: aspirin 81 MG EC tablet   atorvastatin 80 MG tablet Commonly known as: LIPITOR Place 1 tablet (80 mg total) into feeding tube daily at 6 PM. What changed: how to take this   chlorhexidine 0.12 % solution Commonly known as: PERIDEX 15 mLs by Mouth Rinse route 2 (two) times daily.   feeding supplement (OSMOLITE 1.5 CAL) Liqd Place 474 mLs into feeding tube 3 (three) times daily. What changed:  how much to take how to take this when to take this   feeding supplement (PRO-STAT SUGAR FREE 64) Liqd Place 30 mLs into feeding tube daily at 12 noon.   folic acid 1 MG tablet Commonly known as: FOLVITE Place 1 tablet (1 mg total) into feeding tube daily. What changed: how to take this   free water Soln Place 200 mLs into feeding tube 4 (four) times daily.   ipratropium-albuterol 0.5-2.5 (3) MG/3ML Soln Commonly known as: DUONEB Take 3 mLs by nebulization every 6 (six) hours as needed.   levETIRAcetam 500 MG tablet Commonly known as: KEPPRA Take 1 tablet (500 mg total) by mouth 2 (two) times daily.   mouth rinse Liqd solution 15 mLs by Mouth Rinse route 2 times daily at 12 noon and 4 pm.     polyethylene glycol powder 17 GM/SCOOP powder Commonly known as: GLYCOLAX/MIRALAX Place 17 g into feeding tube daily. What changed: how to take this   thiamine 100 MG tablet Take 1 tablet (100 mg total) by mouth daily.        Disposition and follow-up:   Miguel [name].  At the hospital follow up visit please address:  1.  Pt found to have severe oral deficits on modified barium swallow. G-tube placed for long-term nutrition needs on 8/17. Pt tolerating bolus tube feeds TID and free water flushes QID at discharge. Continue to monitor pt's nutritional status as he is severely malnourished. Continues to be high risk for aspiration, should remain NPO.   2.  Labs / imaging needed at time of follow-up: NONE  3.  Pending labs/ test needing follow-up: NONE  Follow-up Appointments: Contact information for after-discharge care     Destination     [facility] .   Service: Skilled Nursing Contact information: [address] [place] [phone]                Hospital Course by problem list: Miguel [name] is a 68 year old male who presented with a 2-day history of dysphasia after a possible aspiration event at home where he choked on breakfast and  then was unable to tolerate PO for the following two days. His medical history is significant for CVA in May of 2020 leaving him with residual deficits of dysphagia, R facial droop, and L hemiparesis. Was previously on a dysphagia 2 diet.  Pt was afebrile, had no leukocytosis, and did not require supplemental oxygen so aspiration pneumonia was thought to be unlikely. On 8/13, modified barium swallow showed severe oral deficits making it difficult for pt to meet nutritional needs by PO alone. He was keep NPO and on maintenance fluids. On 8/17, IR placed a G tube placed for long-term nutrition. Trickle feeds started on  8/19. On 8/21, pt was at full tube feed goal as below.  Dietitian consulted for feeding regimen - Osmolite 1.5 formula with goal volume 474 ml (2 cartons/ARCs) given TID - Provide 30 ml Prostat (or equivalent) once daily per tube.  - Free water flushes of 200 ml given QID in between bolus feeds.  Tube feeding regimen to provide 2233 kcal (100% of needs), 104 grams of protein, and 1881 ml water.   Pt given teaspoons of nectar thick liquid on 8/21, which he acknowledges that he choked on. Likely aspirated, and had one fever of 100.5 that afternoon. CXR showing atelectasis vs early infiltrate at both lung bases. No leukocytosis or supplemental oxygen required, and his respiratory status improved with no further fevers.   Discharge Vitals:   BP (!) 147/95 (BP Location: Left Arm)    Pulse 93    Temp 98.1 F (36.7 C) (Oral)    Resp 18    Ht 6' (1.829 m)    Wt 67 kg    SpO2 95%    BMI 20.03 kg/m   Pertinent Labs, Studies, and Procedures:  CBC Latest Ref Rng & Units 03/22/2019 03/16/2019 03/15/2019  WBC 4.0 - 10.5 K/uL 8.2 13.0(H) 5.1  Hemoglobin 13.0 - 17.0 g/dL 13.1 14.4 14.6  Hematocrit 39.0 - 52.0 % 39.7 42.3 43.0  Platelets 150 - 400 K/uL 259 190 180   BMP Latest Ref Rng & Units 03/22/2019 03/21/2019 03/20/2019  Glucose 70 - 99 mg/dL 121(H) 107(H) 111(H)  BUN 8 - 23 mg/dL 14 13 11   Creatinine 0.61 - 1.24 mg/dL 0.92 0.72 0.77  Sodium 135 - 145 mmol/L 138 138 137  Potassium 3.5 - 5.1 mmol/L 4.5 5.0 4.1  Chloride 98 - 111 mmol/L 105 106 104  CO2 22 - 32 mmol/L 24 22 22   Calcium 8.9 - 10.3 mg/dL 9.1 8.9 8.2(L)   MBS-Modified Barium Swallow Study 8/13 Pt presents with a severe oral and mild pharyngeal dysphagia.  There is significant inability to control and propel solid boluses into the pharynx.  There is limited anterior-posterior tongue movement, and despite pt's efforts, solids had to be orally suctioned from his mouth.  Thin and nectar liquids had to be delivered by teaspoon, due to  inability to seal lips around cup edge/straw; there was bilateral spillage anteriorly from mouth, and the rest of the liquid bolus spilled passively into the pharynx.  Pt was able to trigger a protective swallow, with adequate laryngeal vestibule closure for nectars.  Thin liquids traveled too quickly and spilled into the larynx and airway before pt could  achieve airway closure.  Aspiration was accompanied by a cough.  There was no pharyngeal residue post-swallow.  While there is a component of pharyngeal deficit, the oral deficits are severe and will be unfortunately prohibitive - it will likely be quite difficult for pt to meet his  nutritional needs by PO alone.  He could reasonably consume nectar-thick liquids by teaspoon, both for pleasure and therapeutically, but pt/family may need to explore longer-term options for nutrition (PEG) if this is consistent with their goals for interventions.      CXR 8/21 CLINICAL DATA:  Evaluate for new onset low grade fever today - hx of htn, DVT, hydrocephalus, stroke, seizure   EXAM: PORTABLE CHEST 1 VIEW   COMPARISON:  03/08/2019   FINDINGS: Film is made with more shallow lung inflation compared with multiple prior exams. Heart size is normal. There is patchy density at both lung bases. This could represent hypoinflation or early infiltrate. No evidence for pulmonary edema. Patient has a ventriculoperitoneal shunt, coursing across the RIGHT hemithorax. Remote ORIF of the LEFT humerus.   IMPRESSION: Atelectasis versus early infiltrate at both lung bases.     Discharge Instructions: Discharge Instructions     Diet - low sodium heart healthy   Complete by: As directed    Discharge instructions   Complete by: As directed    Please follow discharge instructions as outlined in the discharge summary   Increase activity slowly   Complete by: As directed        Signed: Thom ChimesJones, Autry Prust, MD 03/22/2019, 11:00 AM   Pager: [phone]

## 2019-03-09 NOTE — Progress Notes (Signed)
Occupational Therapy Treatment Patient Details Name: Miguel [name] MRN: [id number] DOB: 26-Jul-1951 Today's Date: 03/09/2019    History of present illness 68 year old male presents the ED for 2-day history of dysphasia.  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   OT comments  Pt progressing toward stated goals, focused session on mobility progression and BADL. Pt was min A for bed mobility and OOB t/fs this date. He needed safety cues to control descent into chair. Facilitated further communication with pt via whiteboard and communication board. With communication board, pt able to name correct month. He also was able to write "I feel alright". Encouraged pt to write more frequently when at home to help communicate needs. Continue to recommend HHOT to facilitate safe and functional d/c home. Will continue to follow.   Follow Up Recommendations  Home health OT;Supervision/Assistance - 24 hour    Equipment Recommendations  None recommended by OT    Recommendations for Other Services      Precautions / Restrictions Precautions Precaution Comments: non-verbal at baseline due to previous CVA; communicates with yes/no Restrictions Weight Bearing Restrictions: No       Mobility Bed Mobility Overal bed mobility: Needs Assistance Bed Mobility: Supine to Sit     Supine to sit: Min assist     General bed mobility comments: min A for BLE translation to EOB and getting feet to floor for safe t/f  Transfers Overall transfer level: Needs assistance Equipment used: Rolling walker (2 wheeled) Transfers: Sit to/from Stand Sit to Stand: Min assist         General transfer comment: min A to power up to standing, cueing for hand palcement and safety    Balance Overall balance assessment: Needs assistance Sitting-balance support: No upper extremity supported;Feet supported Sitting  balance-Leahy Scale: Fair     Standing balance support: Bilateral upper extremity supported;During functional activity Standing balance-Leahy Scale: Poor Standing balance comment: reliant on external support/RW                           ADL either performed or assessed with clinical judgement   ADL Overall ADL's : Needs assistance/impaired Eating/Feeding: NPO                   Lower Body Dressing: Minimal assistance;Sit to/from stand;Sitting/lateral leans Lower Body Dressing Details (indicate cue type and reason): to don socks Toilet Transfer: Minimal assistance;BSC;Grab bars;RW;Cueing for safety Toilet Transfer Details (indicate cue type and reason): simulated with recliner, needs safety cues to control descent           General ADL Comments: pt needing safety cues this date to control descent with BADL transfers     Vision Baseline Vision/History: No visual deficits     Perception     Praxis      Cognition Arousal/Alertness: Awake/alert Behavior During Therapy: Flat affect Overall Cognitive Status: Difficult to assess                                 General Comments: nonverbal at baseline. When asked to name month pt was oriented to correct month. He also appropriately followed yes/no questions this date with some increased processing time        Exercises     Shoulder Instructions       General Comments  Pertinent Vitals/ Pain       Pain Assessment: No/denies pain Faces Pain Scale: No hurt  Home Living                                          Prior Functioning/Environment              Frequency  Min 2X/week        Progress Toward Goals  OT Goals(current goals can now be found in the care plan section)  Progress towards OT goals: Progressing toward goals  Acute Rehab OT Goals Patient Stated Goal: unable to verbalize OT Goal Formulation: With patient Time For Goal Achievement:  03/22/19 Potential to Achieve Goals: Good  Plan Discharge plan remains appropriate;Frequency remains appropriate    Co-evaluation                 AM-PAC OT "6 Clicks" Daily Activity     Outcome Measure   Help from another person eating meals?: Total(NPO) Help from another person taking care of personal grooming?: None Help from another person toileting, which includes using toliet, bedpan, or urinal?: A Little Help from another person bathing (including washing, rinsing, drying)?: A Little Help from another person to put on and taking off regular upper body clothing?: None Help from another person to put on and taking off regular lower body clothing?: A Little 6 Click Score: 18    End of Session Equipment Utilized During Treatment: Gait belt;Rolling walker  OT Visit Diagnosis: Other abnormalities of gait and mobility (R26.89);Muscle weakness (generalized) (M62.81);Cognitive communication deficit (R41.841);Other symptoms and signs involving cognitive function   Activity Tolerance Patient tolerated treatment well   Patient Left in chair;with call bell/phone within reach;with chair alarm set   Nurse Communication          Time: 1020-1030 OT Time Calculation (min): 10 min  Charges: OT General Charges $OT Visit: 1 Visit OT Treatments $Self Care/Home Management : 8-22 mins  Dalphine HandingKaylee Everlina Gotts, MSOT, OTR/L Behavioral Health OT/ Acute Relief OT [facility] Office: [phone]  Dalphine HandingKaylee Carrissa Taitano 03/09/2019, 2:35 PM

## 2019-03-09 NOTE — Progress Notes (Signed)
  Date: 03/09/2019  Patient name: Miguel [name]  Medical record number: [id number]  Date of birth: [birth year]   I have seen and evaluated this patient and I have discussed the plan of care with the house staff. Please see Dr. Ronnald Ramp' note for complete details. I concur with her findings and plan.    Sid Falcon, MD 03/09/2019, 3:31 PM

## 2019-03-10 LAB — BASIC METABOLIC PANEL
Anion gap: 11 (ref 5–15)
BUN: 5 mg/dL — ABNORMAL LOW (ref 8–23)
CO2: 24 mmol/L (ref 22–32)
Calcium: 9.1 mg/dL (ref 8.9–10.3)
Chloride: 103 mmol/L (ref 98–111)
Creatinine, Ser: 0.88 mg/dL (ref 0.61–1.24)
GFR calc Af Amer: >60 mL/min (ref >60)
GFR calc non Af Amer: >60 mL/min (ref >60)
Glucose, Bld: 103 mg/dL — ABNORMAL HIGH (ref 70–99)
Potassium: 3.4 mmol/L — ABNORMAL LOW (ref 3.5–5.1)
Sodium: 138 mmol/L (ref 135–145)

## 2019-03-10 LAB — GLUCOSE, CAPILLARY: Glucose-Capillary: 85 mg/dL (ref 70–99)

## 2019-03-10 LAB — CBC
HCT: 42.3 % (ref 39.0–52.0)
Hemoglobin: 14.5 g/dL (ref 13.0–17.0)
MCH: 30.5 pg (ref 26.0–34.0)
MCHC: 34.3 g/dL (ref 30.0–36.0)
MCV: 89.1 fL (ref 80.0–100.0)
Platelets: 189 10*3/uL (ref 150–400)
RBC: 4.75 MIL/uL (ref 4.22–5.81)
RDW: 13.8 % (ref 11.5–15.5)
WBC: 5.5 10*3/uL (ref 4.0–10.5)
nRBC: 0 % (ref 0.0–0.2)

## 2019-03-10 MED ORDER — HYDRALAZINE HCL 20 MG/ML IJ SOLN
2.0000 mg | Freq: Four times a day (QID) | INTRAMUSCULAR | Status: DC | PRN
  Administered 2019-03-10: 2 mg via INTRAVENOUS
  Filled 2019-03-10: qty 1

## 2019-03-10 MED ORDER — ORAL CARE MOUTH RINSE
15.0000 mL | Freq: Two times a day (BID) | OROMUCOSAL | Status: DC
  Administered 2019-03-10 – 2019-03-21 (×22): 15 mL via OROMUCOSAL

## 2019-03-10 MED ORDER — POTASSIUM CHLORIDE 10 MEQ/100ML IV SOLN
10.0000 meq | INTRAVENOUS | Status: AC
  Administered 2019-03-10 (×2): 10 meq via INTRAVENOUS
  Filled 2019-03-10 (×2): qty 100

## 2019-03-10 MED ORDER — CHLORHEXIDINE GLUCONATE 0.12 % MT SOLN
15.0000 mL | Freq: Two times a day (BID) | OROMUCOSAL | Status: DC
  Administered 2019-03-10 – 2019-03-22 (×25): 15 mL via OROMUCOSAL
  Filled 2019-03-10 (×24): qty 15

## 2019-03-10 NOTE — Progress Notes (Signed)
  Speech Language Pathology Treatment: Dysphagia  Patient Details Name: Miguel [name] MRN: [id number] DOB: 09/16/1950 Today's Date: 03/10/2019 Time: 1216-1232 SLP Time Calculation (min) (ACUTE ONLY): 16 min  Assessment / Plan / Recommendation Clinical Impression  Skilled observation of intake with nectar-thickened liquids/puree with min verbal/tactile cues provided; oral manipulation/propulsion decreased with oral intake; anterior loss minimized with placement of spoon at midline during intake of puree/nectar-thickened liquids; multiple audible swallows noted with delay in the initiation of the swallow with all consistencies; MBS recommended to r/o aspiration and determine safest consistency at current time prior to d/c and scheduling availability with radiology department; continue NPO until objective assessment completed.  HPI HPI: 68 year-old male presents the ED for 2-day history of dysphasia.  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      SLP Plan  Continue with current plan of care;MBS       Recommendations  Diet recommendations: NPO Medication Administration: Via alternative means                General recommendations: Other(comment)(HH SLP) Oral Care Recommendations: Oral care QID Follow up Recommendations: Home health SLP;Skilled Nursing facility;Inpatient Rehab;24 hour supervision/assistance SLP Visit Diagnosis: Dysphagia, oropharyngeal phase (R13.12) Plan: Continue with current plan of care;MBS                      Elvina Sidle, M.S., CCC-SLP 03/10/2019, 12:45 PM

## 2019-03-10 NOTE — Progress Notes (Signed)
  Date: 03/10/2019  Patient name: Miguel [name]  Medical record number: [id number]  Date of birth: [birth year]   I have seen and evaluated this patient and I have discussed the plan of care with the house staff. Please see Dr. Ronnald Ramp' note for complete details. I concur with her findings and plan.  MBS is planned, hopefully this will allow further deliniation of whether it is safe for him to take PO food/liquids.  If the recommendation is to remain NPO we will need to discuss further options with him and his daughter.    Sid Falcon, MD 03/10/2019, 3:07 PM

## 2019-03-10 NOTE — Progress Notes (Signed)
Subjective: Pt seen at the bedside this morning. Looks comfortable in bed. States he is feeling well. Denies abdominal pain or shortness of breath.  Objective:  Vital signs in last 24 hours: Vitals:   03/09/19 2014 03/10/19 0000 03/10/19 0034 03/10/19 0355  BP: (!) 177/103  (!) 188/101 (!) 160/103  Pulse: 60 72 67 60  Resp: 18  20 19   Temp: 97.7 F (36.5 C)  98.2 F (36.8 C) (!) 97.4 F (36.3 C)  TempSrc:    Oral  SpO2: 94%   91%  Weight:       Physical Exam Vitals signs reviewed.  Constitutional:      General: He is not in acute distress. HENT:     Mouth/Throat:     Mouth: Mucous membranes are moist.     Comments: Slight drool in corner of mouth and beard Cardiovascular:     Rate and Rhythm: Normal rate and regular rhythm.     Heart sounds: Normal heart sounds. No murmur. No friction rub. No gallop.   Pulmonary:     Effort: Pulmonary effort is normal. No prolonged expiration or respiratory distress.     Breath sounds: No rhonchi or rales.     Comments: End expiratory wheezes at the bases. Abdominal:     General: Bowel sounds are normal. There is no distension.     Palpations: Abdomen is soft.     Tenderness: There is no abdominal tenderness.  Musculoskeletal:     Right lower leg: No edema.     Left lower leg: No edema.  Skin:    General: Skin is warm and dry.  Neurological:     Mental Status: He is alert.     Cranial Nerves: Facial asymmetry (R facial droop) present.     Comments: Able to move all extremities and follow commands. L side weaker than R.  Psychiatric:        Mood and Affect: Affect is flat.     Assessment/Plan: Miguel [name] is a 68 year old male who presented with a 2-day history of dysphasia after possible aspiration event at home. 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 Pt has residual deficits of R facial droop and L hemiparesis. Had previously been on diet of dysphagia 2  (chopped)/nectar-thickened liquids.  Acute on chronic dysphagia No evidence for aspiration pneumonia at this time - not requiring supplemental oxygen, no leukocytosis, and afebrile - speech therapy recommendations  - NPO   - modified barium swallow to rule out aspiration - no urgent need for NG tube placement today - potassium chloride IV 9mEq x2 - could consider PEG placement in the future if pt unable to swallow long-term  - aspiration precautions - continue PT/OT - recommending home health   Hypertension  - hold home amlodipine 5mg   - PRN hydralazine 2mg  available   Nutrition consulted, appreciate their recommendations. Malnutrition Type: Nutrition Problem: Severe Malnutrition Etiology: chronic illness(CVA) Malnutrition Characteristics: Signs/Symptoms: moderate fat depletion, severe fat depletion, moderate muscle depletion, severe muscle depletion Nutrition Interventions: Interventions: Refer to RD note for recommendations   Seizure disorder -continue keppra 500mg  BID IV (while pt NPO) -seizure precautions  History of atrial fibrillation per chart - not seen on EKG Pt previously on xarelto however is currently discontinued -continue aspirin 150mg  suppository   Hyperlipidemia -hold home lipitor 80mg   History of ETOH abuse Unclear if he is currently using alcohol.  -CIWA    Diet - NPO Fluids -  75mL D5 1/2NS DVT ppx - enoxaparin 40mg  subQ daily CODE STATUS - FULL CODE   Dispo: Anticipated discharge in approximately 1-2 day(s) pending swallow study.  Live with daughter, Miguel [name] - [phone].   Thom ChimesJones, Miguel Karapetian, MD 03/10/2019, 6:44 AM Pager: [phone]

## 2019-03-10 NOTE — Care Management Important Message (Signed)
Important Message  Patient Details  Name: Miguel [name] MRN: [id number] Date of Birth: 04/11/1951   Medicare Important Message Given:  Yes     Orbie Pyo 03/10/2019, 2:12 PM

## 2019-03-11 ENCOUNTER — Inpatient Hospital Stay (HOSPITAL_COMMUNITY): Payer: Medicare Other

## 2019-03-11 LAB — BASIC METABOLIC PANEL
Anion gap: 10 (ref 5–15)
BUN: <5 mg/dL — ABNORMAL LOW (ref 8–23)
CO2: 21 mmol/L — ABNORMAL LOW (ref 22–32)
Calcium: 9.1 mg/dL (ref 8.9–10.3)
Chloride: 107 mmol/L (ref 98–111)
Creatinine, Ser: 0.87 mg/dL (ref 0.61–1.24)
GFR calc Af Amer: >60 mL/min (ref >60)
GFR calc non Af Amer: >60 mL/min (ref >60)
Glucose, Bld: 100 mg/dL — ABNORMAL HIGH (ref 70–99)
Potassium: 3.4 mmol/L — ABNORMAL LOW (ref 3.5–5.1)
Sodium: 138 mmol/L (ref 135–145)

## 2019-03-11 NOTE — Progress Notes (Signed)
Physical Therapy Treatment Patient Details Name: Miguel DohenyRay J Sneath MRN: [id number] DOB: [birth year] Today's Date: 03/11/2019    History of Present Illness 68 year old male presents the ED for 2-day history of dysphasia.  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    PT Comments    Patient seen for mobility progression. Pt requires min/mod A for gait training with RW due to difficulty with L LE advancement. Pt was very fatigued with increased gait distance this session.  Continue to recommend further skilled PT services to maximize independence and safety with mobility.   Follow Up Recommendations  Home health PT;Supervision/Assistance - 24 hour     Equipment Recommendations  None recommended by PT    Recommendations for Other Services       Precautions / Restrictions Precautions Precautions: Fall;Other (comment) Precaution Comments: non-verbal at baseline due to previous CVA; communicates with yes/no Restrictions Weight Bearing Restrictions: No    Mobility  Bed Mobility Overal bed mobility: Needs Assistance Bed Mobility: Supine to Sit     Supine to sit: Min assist     General bed mobility comments: assist to elevate trunk into sitting and to scoot hips to EOB  Transfers Overall transfer level: Needs assistance Equipment used: Rolling walker (2 wheeled) Transfers: Sit to/from Stand Sit to Stand: Min assist;Mod assist         General transfer comment: assist to power up into standing with cues for safe hand placement  Ambulation/Gait Ambulation/Gait assistance: Min assist;Mod assist Gait Distance (Feet): 70 Feet Assistive device: Rolling walker (2 wheeled) Gait Pattern/deviations: Step-to pattern;Decreased step length - left;Trunk flexed Gait velocity: decreased   General Gait Details: multimodal cues and assist at times for L LE advancement; increased  difficulty with sequencing and L LE mobility with fatigue and/or distracting environment; verbal cues for safe use of AD; assistance required for balance and guiding RW; pt very fatigued upon return to room and required sitting in chair close to door then SPT into recliner    Stairs             Wheelchair Mobility    Modified Rankin (Stroke Patients Only)       Balance Overall balance assessment: Needs assistance Sitting-balance support: No upper extremity supported;Feet supported Sitting balance-Leahy Scale: Fair     Standing balance support: Bilateral upper extremity supported;During functional activity Standing balance-Leahy Scale: Poor Standing balance comment: reliant on external support/RW                            Cognition Arousal/Alertness: Awake/alert Behavior During Therapy: Flat affect Overall Cognitive Status: Difficult to assess                                 General Comments: nonverbal at baseline; pt follows single step commands consistently      Exercises      General Comments        Pertinent Vitals/Pain Pain Assessment: No/denies pain    Home Living                      Prior Function            PT Goals (current goals can now be found in the care plan section) Progress towards PT goals: Progressing toward goals    Frequency  Min 3X/week      PT Plan Current plan remains appropriate    Co-evaluation              AM-PAC PT "6 Clicks" Mobility   Outcome Measure  Help needed turning from your back to your side while in a flat bed without using bedrails?: None Help needed moving from lying on your back to sitting on the side of a flat bed without using bedrails?: A Little Help needed moving to and from a bed to a chair (including a wheelchair)?: A Little Help needed standing up from a chair using your arms (e.g., wheelchair or bedside chair)?: A Little Help needed to walk in hospital  room?: A Little Help needed climbing 3-5 steps with a railing? : Total 6 Click Score: 17    End of Session Equipment Utilized During Treatment: Gait belt Activity Tolerance: Patient tolerated treatment well Patient left: in chair;with chair alarm set;with call bell/phone within reach Nurse Communication: Mobility status PT Visit Diagnosis: Other abnormalities of gait and mobility (R26.89);Muscle weakness (generalized) (M62.81)     Time: 8315-1761 PT Time Calculation (min) (ACUTE ONLY): 28 min  Charges:  $Gait Training: 23-37 mins                     Earney Navy, PTA Acute Rehabilitation Services Pager: [phone] Office: [phone]     Darliss Cheney 03/11/2019, 2:31 PM

## 2019-03-11 NOTE — Progress Notes (Signed)
Patient is still NPOwith occassional drooling from mouth.  Poor dental hygiene noted but pt refuses mouth care r/t to   wiggling  teeth which are  painful when mouth care is attempted by nursing staff.

## 2019-03-11 NOTE — Progress Notes (Signed)
  Date: 03/11/2019  Patient name: Miguel [name]  Medical record number: [id number]  Date of birth: 07-05-1951   I have seen and evaluated this patient and I have discussed the plan of care with the house staff. Please see Dr. Ronnald Ramp' note for complete details. I concur with her findings and plan.     Sid Falcon, MD 03/11/2019, 8:20 PM

## 2019-03-11 NOTE — Progress Notes (Signed)
[facility] Seen at the bedside this PM, patient's daughter at the bedside.   Objective:  Vital signs in last 24 hours: Vitals:   03/10/19 2020 03/10/19 2313 03/11/19 0335 03/11/19 0400  BP:  (!) 153/93 (!) 135/92   Pulse:  71 65   Resp:  18 16   Temp:  98.1 F (36.7 C) 97.9 F (36.6 C)   TempSrc:   Oral   SpO2: 97%  98%   Weight:      Height:    6' (1.829 m)   Physical Exam Constitutional:      General: He is not in acute distress.    Comments: Pt sitting in the chair.  HENT:     Mouth/Throat:     Mouth: Mucous membranes are moist.     Comments: Drooling. Cardiovascular:     Rate and Rhythm: Normal rate and regular rhythm.     Heart sounds: Normal heart sounds. No murmur. No friction rub. No gallop.   Pulmonary:     Effort: Pulmonary effort is normal. No tachypnea or respiratory distress.     Comments: Scattered rhonchi Abdominal:     General: Abdomen is flat. Bowel sounds are normal. There is no distension.     Palpations: Abdomen is soft.     Tenderness: There is no abdominal tenderness.  Musculoskeletal:     Right lower leg: No edema.     Left lower leg: No edema.  Neurological:     Mental Status: He is alert.     Cranial Nerves: Facial asymmetry (R facial droop) present.  Psychiatric:        Mood and Affect: Affect is flat.     Assessment/Plan: Mr. Miguel [name] is a 68 year old male who presented with a 2-day history of dysphasia after possible aspiration event at home. 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 Pt has residual deficits of R facial droop and L hemiparesis. Had previously been on diet of dysphagia 2 (chopped)/nectar-thickened liquids.  Acute on chronic dysphagia  - modified barium swallow today - oral deficits are severe and will be unfortunately prohibitive, it will likely be quite difficult for pt to meet his nutritional needs by PO alone - could reasonably consume  nectar-thick liquids by teaspoon, both for pleasure and therapeutically Discussed the results with pt and daughter at the bedside this afternoon. Explained the options of PEG vs comfort care. Pt's affect flat at baseline, but appeared dejected during the conversation. Daughter, Miguel [name], disappointed at the long-term prognosis for the pt's condition. She was interested in speaking with other family members about goals of care. - plan to follow up with family again tomorrow - NPO - aspiration precautions - continue PT/OT - recommending home health   Hypertension  - hold home amlodipine 5mg   - PRN hydralazine 2mg  available   Nutrition consulted, appreciate their recommendations. Malnutrition Type: Nutrition Problem: Severe Malnutrition Etiology: chronic illness(CVA) Malnutrition Characteristics: Signs/Symptoms: moderate fat depletion, severe fat depletion, moderate muscle depletion, severe muscle depletion Nutrition Interventions: Interventions: Refer to RD note for recommendations   Seizure disorder -continue keppra 500mg  BID IV (while pt NPO) -seizure precautions  History of atrial fibrillation per chart - not seen on EKG Pt previously on xarelto however is currently discontinued -continue aspirin 150mg  suppository   Hyperlipidemia -hold home lipitor 80mg   History of ETOH abuse Unclear if he is currently using alcohol.  -CIWA    Diet - NPO  Fluids - 81mL D5 1/2NS DVT ppx - enoxaparin 40mg  subQ daily CODE STATUS - FULL CODE   Dispo: Anticipated discharge in approximately 1-2 day(s) pending PEG placement vs comfort care.  Live with daughter, Miguel [name] - [phone].   Ladona Horns, MD 03/11/2019, 6:52 AM Pager: [phone]

## 2019-03-11 NOTE — Progress Notes (Signed)
Modified Barium Swallow Progress Note  Patient Details  Name: Miguel [name] MRN: [id number] Date of Birth: 02/20/1951  Today's Date: 03/11/2019  Modified Barium Swallow completed.  Full report located under Chart Review in the Imaging Section.  Brief recommendations include the following:  Clinical Impression  Pt presents with a severe oral and mild pharyngeal dysphagia.  There is significant inability to control and propel solid boluses into the pharynx.  There is limited anterior-posterior tongue movement, and despite pt's efforts, solids had to be orally suctioned from his mouth.  Thin and nectar liquids had to be delivered by teaspoon, due to inability to seal lips around cup edge/straw; there was bilateral spillage anteriorly from mouth, and the rest of the liquid bolus spilled passively into the pharynx.  Pt was able to trigger a protective swallow, with adequate laryngeal vestibule closure for nectars.  Thin liquids traveled too quickly and spilled into the larynx and airway before pt could  achieve airway closure.  Aspiration was accompanied by a cough.  There was no pharyngeal residue post-swallow.  While there is a component of pharyngeal deficit, the oral deficits are severe and will be unfortunately prohibitive - it will likely be quite difficult for pt to meet his nutritional needs by PO alone.  He could reasonably consume nectar-thick liquids by teaspoon, both for pleasure and therapeutically, but pt/family may need to explore longer-term options for nutrition (PEG) if this is consistent with their goals for interventions.      Swallow Evaluation Recommendations       SLP Diet Recommendations: Nectar thick liquid(sips of nectar thick from spoon)       Medication Administration: Other (Comment)(crushed with liquid or liquid form when possible)   Supervision: Staff to assist with self feeding           Oral Care Recommendations: Oral care QID       Miguel [name], [place] Office number [phone] Pager [phone]  Miguel [name] 03/11/2019,12:07 PM

## 2019-03-12 ENCOUNTER — Telehealth: Payer: Self-pay | Admitting: Family Medicine

## 2019-03-12 LAB — BASIC METABOLIC PANEL
Anion gap: 8 (ref 5–15)
BUN: <5 mg/dL — ABNORMAL LOW (ref 8–23)
CO2: 21 mmol/L — ABNORMAL LOW (ref 22–32)
Calcium: 8.8 mg/dL — ABNORMAL LOW (ref 8.9–10.3)
Chloride: 108 mmol/L (ref 98–111)
Creatinine, Ser: 0.83 mg/dL (ref 0.61–1.24)
GFR calc Af Amer: >60 mL/min (ref >60)
GFR calc non Af Amer: >60 mL/min (ref >60)
Glucose, Bld: 107 mg/dL — ABNORMAL HIGH (ref 70–99)
Potassium: 3.1 mmol/L — ABNORMAL LOW (ref 3.5–5.1)
Sodium: 137 mmol/L (ref 135–145)

## 2019-03-12 MED ORDER — POTASSIUM CHLORIDE 10 MEQ/100ML IV SOLN
10.0000 meq | INTRAVENOUS | Status: AC
  Administered 2019-03-12 (×5): 10 meq via INTRAVENOUS
  Filled 2019-03-12 (×5): qty 100

## 2019-03-12 NOTE — Progress Notes (Addendum)
Subjective: Pt seen at the bedside this morning. Awake, sitting up in bed comfortably. Inquired if pt recalled the discussion yesterday regarding the PEG tube, he nodded yes. When asked if he was interesting in having a PEG tube placed, he pointed to "No" on the language card.   Objective:  Vital signs in last 24 hours: Vitals:   03/11/19 2015 03/11/19 2052 03/11/19 2347 03/12/19 0346  BP: (!) 140/91  (!) 147/81 126/78  Pulse: 62 71 63 66  Resp: 19  16 17   Temp: (!) 97.5 F (36.4 C)  97.7 F (36.5 C) 97.8 F (36.6 C)  TempSrc: Oral     SpO2: (!) 75% 100% 100% 99%  Weight:      Height:       Physical Exam Vitals signs reviewed.  Constitutional:      General: He is not in acute distress. HENT:     Mouth/Throat:     Mouth: Mucous membranes are moist.     Comments: drooling Cardiovascular:     Rate and Rhythm: Normal rate and regular rhythm.     Heart sounds: Normal heart sounds. No murmur. No friction rub. No gallop.   Pulmonary:     Effort: Pulmonary effort is normal. No tachypnea or respiratory distress.     Breath sounds: No decreased air movement. No wheezing or rales.     Comments: Scattered rhonchi. Abdominal:     General: Bowel sounds are normal. There is no distension.     Palpations: Abdomen is soft.     Tenderness: There is no abdominal tenderness.  Musculoskeletal:     Right lower leg: No edema.     Left lower leg: No edema.  Neurological:     Mental Status: He is alert and oriented to person, place, and time.     Cranial Nerves: Facial asymmetry (R facial droop) present.  Psychiatric:        Mood and Affect: Affect is flat.     Assessment/Plan: Mr. Eliberto Ivoryustin is a 68 year old male who presented with a 2-day history of dysphasia after possible aspiration event at home. 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 Pt has residual deficits of dysphagia, R facial droop, and L hemiparesis.  Acute on  chronic dysphagia  Modified barium swallow - oral deficits are severe and will be unfortunately prohibitive, it will likely be quite difficult for pt to meet his nutritional needs by PO alone - could reasonably consume nectar-thick liquids by teaspoon, both for pleasure and therapeutically  Discussed the results with pt and daughter at the bedside yesterday afternoon, explaining the options of PEG vs comfort care. Pt appeared dejected during this conversation, and communicated this morning that he is not interested in PED tube placement. Called his daughter, Archie Pattenonya, who is disappointed at the long-term prognosis for her father, but recalls that he didn't appear to want the PEG tube during yesterday's discussion. She stated she would continue discussions with her family. Explained that the pt is competent to make decisions regarding his own medical care, and at this time the treatment team would not pursue PEG placement. She was understanding.  - pt not interested in PEG tube placement at this time - hospice consult - NPO - aspiration precautions - continue PT/OT - recommending home health   Hypertension  - hold home amlodipine 5mg   - PRN hydralazine 2mg  available   Nutrition consulted, appreciate their recommendations. Malnutrition Type: Nutrition Problem: Severe Malnutrition  Etiology: chronic illness(CVA) Malnutrition Characteristics: Signs/Symptoms: moderate fat depletion, severe fat depletion, moderate muscle depletion, severe muscle depletion Nutrition Interventions: Interventions: Refer to RD note for recommendations  Seizure disorder -continue keppra 500mg  BID IV (while pt NPO) -seizure precautions  History of atrial fibrillation per chart - not seen on EKG Pt previously on xarelto however is currently discontinued -continue aspirin 150mg  suppository  Hyperlipidemia -hold home lipitor 80mg   History of ETOH abuse Unclear if he is currently using alcohol.  -CIWA     Diet - NPO Fluids - 87mL D5 1/2NS DVT ppx - enoxaparin 40mg  subQ daily CODE STATUS - FULL CODE   Dispo: Anticipated discharge in approximately 1-2 day(s) pending hospice arrangements. Live with daughter, [name] - [phone].   ~~~ADDENDUM~~~ Pt seen at the bedside again this afternoon. Explained that it is important to have clarity on what the pt's wishes are. Reiterated that if the pt does not get the PEG tube placed, he will not be able to maintain his nutrition and will die. Pt's affect continues to be flat. It took him a long time to respond to questions. When asked if he understood that not getting the tube would cause him to die, he pointed between the responses "No" and "Yes" on the language card. When asked if he wanted the tube and to indicate "No" for no tube and "Yes" for tube, he again pointed between the answers and shook his head yes. ?It is difficult to assess the pt's understanding of the situation (choosing between PEG and comfort). Palliative care has been consulted at this time to facilitate discussions between the pt and his family to better illuminate his goals of care.    Ladona Horns, MD 03/12/2019, 6:33 AM Pager: [phone]

## 2019-03-12 NOTE — Progress Notes (Signed)
Hydrologist [facility])  Hospital Liaison: RN note    Notified by Jacqualin Combes, CMRN of patient/family request for [facility] services at home after discharge. Chart and patient information under review by [facility] physician. Hospice eligibility pending currently.     Writer spoke with daughter, [name] to initiate education related to hospice philosophy, services and team approach to care.           verbalized understanding of information given.    Patient will need prescriptions for discharge comfort medications.     DME needs have been discussed, patient currently has the following equipment in the home: Hospital bed, W/C and walker.  Patient/family requests the following DME for delivery to the home: none.    [facility] aware of the above. Please notify [facility] when patient is ready to leave the unit at discharge. (Call [phone] or [phone] after 5pm.) [facility] information and contact numbers given to [facility].      Please call with any hospice related questions.     Thank you for this referral.     Farrel Gordon, RN, CCM  [place] (listed on [facility] under Hospice and [place] [facility] [place])  [phone]

## 2019-03-12 NOTE — Progress Notes (Signed)
  Speech Language Pathology Treatment: Dysphagia  Patient Details Name: Miguel [name] MRN: [id number] DOB: 1951/05/02 Today's Date: 03/12/2019 Time: 2122-4825 SLP Time Calculation (min) (ACUTE ONLY): 15 min  Assessment / Plan / Recommendation Clinical Impression  F/u after yesterday's MBS.  Reviewed, in basic terms, results from yesterday's study.  Offered pt teaspoons of nectar-thick liquid, which he appeared eager to try.  He consumed eight teaspoons of nectar, requiring placement of spoon on mid-posterior tongue in an effort to bypass oral phase. Pt achieved palpable swallow with no overt s/s of aspiration.  He appeared to derive some pleasure from even minimal oral intake.  Recommend allowing sips of nectar thick liquid from a spoon.  D/W RN.  SLP will follow for plan of care.    HPI HPI: 68 year-old male presents the ED for 2-day history of acute on chronic dysphagia.  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  Brain imaging shows Large areas of old infarct and encephalomalacia noted involving the frontal lobes bilaterally as well as an area of old infarct involving the left temporal lobe. Chronic right paramedian pontine infarction.   MBS 8/13 with severe oral dysphagia, milder pharyngeal component. Family is considering comfort approach vs PEG.       SLP Plan  Continue with current plan of care       Recommendations  Diet recommendations: Nectar-thick liquid Liquids provided via: Teaspoon Medication Administration: Via alternative means Compensations: Small sips/bites Postural Changes and/or Swallow Maneuvers: Seated upright 90 degrees                Oral Care Recommendations: Oral care QID Follow up Recommendations: Home health SLP SLP Visit Diagnosis: Dysphagia, oropharyngeal phase (R13.12) Plan: Continue with current plan of care       GO               Shell Blanchette L. Tivis Ringer, [place] Office number [phone] Pager [phone]   Miguel [name] 03/12/2019, 9:49 AM

## 2019-03-12 NOTE — Progress Notes (Signed)
Nutrition Follow-up  DOCUMENTATION CODES:   Severe malnutrition in context of chronic illness  INTERVENTION:  Nectar thick liquids by teaspoon for comfort.   NUTRITION DIAGNOSIS:   Severe Malnutrition related to chronic illness(CVA) as evidenced by moderate fat depletion, severe fat depletion, moderate muscle depletion, severe muscle depletion; ongoing  GOAL:   Patient will meet greater than or equal to 90% of their needs; met  MONITOR:   Diet advancement, Labs, Weight trends, Skin, I & O's  REASON FOR ASSESSMENT:   Malnutrition Screening Tool    ASSESSMENT:   68 year old male presents the ED for 2-day history of dysphasia.  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 Pt admitted with worsening dysphagia and dehydration. Pt with dysphagia at baseline and on a dysphagia 2 diet with nectar thick liquids prior to admission. MBS 8/13 with severe oral dysphagia, milder pharyngeal component.   Pt refused PEG tube. Per SLP, may allow pt sips of nectar thick liquids from a spoon for pleasure. Hospice arrangements pending. Labs and medications reviewed.   Diet Order:   Diet Order            Diet NPO time specified  Diet effective now              EDUCATION NEEDS:   Not appropriate for education at this time  Skin:  Skin Assessment: Reviewed RN Assessment  Last BM:  Unknown  Height:   Ht Readings from Last 1 Encounters:  03/11/19 6' (1.829 m)    Weight:   Wt Readings from Last 1 Encounters:  03/07/19 66.5 kg    Ideal Body Weight:  80.9 kg  BMI:  Body mass index is 19.88 kg/m.  Estimated Nutritional Needs:   Kcal:  2000-2200  Protein:  100-115 grams  Fluid:  > 2.0 L    Corrin Parker, MS, RD, LDN Pager # [phone] After hours/ weekend pager # [phone]

## 2019-03-12 NOTE — Progress Notes (Signed)
OT Cancellation Note  Patient Details Name: Miguel [name] MRN: [id number] DOB: 1950-11-02   Cancelled Treatment:    Reason Eval/Treat Not Completed: Other (comment) Met with pt lying in bed, attempted to initiate OT session. Pt shaking head no and able to write "stay in bed" on whiteboard. Despite encouragement and education, pt politely choosing to stay in bed at this time. Will continue to follow per OT POC.   Zenovia Jarred, MSOT, OTR/L Behavioral Health OT/ Acute Relief OT [facility] Office: [phone]  Zenovia Jarred 03/12/2019, 4:50 PM

## 2019-03-12 NOTE — TOC Progression Note (Signed)
Transition of Care [facility]) - Progression Note    Patient Details  Name: Miguel [name] MRN: [id number] Date of Birth: [birth year]  Transition of [facility] CM/SW Contact  Pollie Friar, RN Phone Number: 03/12/2019, 2:02 PM  Clinical Narrative:    CM received information that patient is refusing PEG and the plan is for hospice at home. CM spoke to daughter: [name] and she voiced that she was still talking to family but wants to honor her fathers wishes. She is agreeable to home hospice. Choice provided and [facility] selected.  CM will notify [facility] of referral. TOC following.   Expected Discharge Plan: [place] Barriers to Discharge: Continued Medical Work up  Expected Discharge Plan and Services Expected Discharge Plan: [place]   Discharge Planning Services: CM Consult Post Acute Care Choice: [place]: [place] ([facility])         Social Determinants of Health (SDOH) Interventions    Readmission Risk Interventions No flowsheet data found.

## 2019-03-12 NOTE — Progress Notes (Signed)
  Date: 03/12/2019  Patient name: Miguel [name]  Medical record number: [id number]  Date of birth: 1951/02/06   I have seen and evaluated this patient and I have discussed the plan of care with the house staff. Please see Dr. Ronnald Ramp' note for complete details. I concur with her findings and plan.  I was present for discussion with [name] this morning.  He very clearly thought about the situation (PEG placement) and pointed to "no" on the language card.  I feel he understood our conversation.  He is alert and follows conversation, but has difficulty forming words.  We will place a hospice consult.    Sid Falcon, MD 03/12/2019, 12:40 PM

## 2019-03-12 NOTE — Telephone Encounter (Signed)
New Message   Amy with [employer] is calling to see if Dr, Margarita Rana would be the attendee provider for this patient. Please f/u

## 2019-03-13 ENCOUNTER — Inpatient Hospital Stay (HOSPITAL_COMMUNITY): Payer: Medicare Other

## 2019-03-13 LAB — BASIC METABOLIC PANEL
Anion gap: 9 (ref 5–15)
BUN: <5 mg/dL — ABNORMAL LOW (ref 8–23)
CO2: 21 mmol/L — ABNORMAL LOW (ref 22–32)
Calcium: 8.9 mg/dL (ref 8.9–10.3)
Chloride: 109 mmol/L (ref 98–111)
Creatinine, Ser: 0.84 mg/dL (ref 0.61–1.24)
GFR calc Af Amer: >60 mL/min (ref >60)
GFR calc non Af Amer: >60 mL/min (ref >60)
Glucose, Bld: 93 mg/dL (ref 70–99)
Potassium: 3.8 mmol/L (ref 3.5–5.1)
Sodium: 139 mmol/L (ref 135–145)

## 2019-03-13 LAB — SARS CORONAVIRUS 2 BY RT PCR (HOSPITAL ORDER, PERFORMED IN ~~LOC~~ HOSPITAL LAB): SARS Coronavirus 2: NEGATIVE

## 2019-03-13 MED ORDER — ENOXAPARIN SODIUM 40 MG/0.4ML ~~LOC~~ SOLN: 40.0000 mg | SUBCUTANEOUS | Status: DC

## 2019-03-13 NOTE — Progress Notes (Signed)
Chart reviewed.  Discussed with Dr. Daryll Drown.  FMTS has handled what patient needs with regards to Palliative services.  I will cancel the consult and request that FMTS please call us back again if they feel we can be of service.  Florentina Jenny, PA-C Palliative Medicine Pager: [phone]

## 2019-03-13 NOTE — Progress Notes (Addendum)
PT Cancellation Note  Patient Details Name: Miguel [name] MRN: [id number] DOB: 21-Dec-1950   Cancelled Treatment:    Reason Eval/Treat Not Completed: Patient at procedure or test/unavailable. Pt in CT. PT to re-attempt as time allows.   Addendum 1306: Re-attempted PT. Pt sleeping soundly. Unable to arouse.   Lorriane Shire 03/13/2019, 11:22 AM   Lorrin Goodell, PT  Office # [phone] Pager [phone]

## 2019-03-13 NOTE — Plan of Care (Signed)
Patient unable to make verbal comments due to dysphagia.
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
## 2019-03-13 NOTE — Progress Notes (Signed)
  Date: 03/13/2019  Patient name: Miguel [name]  Medical record number: [id number]  Date of birth: 1951/01/05   I have seen and evaluated this patient and I have discussed the plan of care with the house staff. Please see Miguel [name] note for complete details. I concur with his findings and plan.  I was present for discussion with Miguel [name] and agree with assessment.  Will proceed with PEG tube placement.    Miguel Falcon, MD 03/13/2019, 8:47 PM

## 2019-03-13 NOTE — Progress Notes (Signed)
   Subjective: Pt somewhat more interactive today.  He is able to nod yes or no.  He indicated he has thought more about the PEG tube.  He now indicates he would like PEG tube placement.  Discussed this with the family who is also in agreement with going forward with PEG tube placement.  He denies any pain or shortness of breath  Objective:  Vital signs in last 24 hours: Vitals:   03/12/19 2345 03/13/19 0416 03/13/19 0753 03/13/19 0954  BP: (!) 156/82 (!) 158/88  128/80  Pulse: 65 64  71  Resp: 16 18  16   Temp: 98 F (36.7 C) 97.6 F (36.4 C)  97.6 F (36.4 C)  TempSrc: Oral Oral  Axillary  SpO2: 97% 98% 97% 98%  Weight:      Height:       Cardiac: JVD flat, normal rate and rhythm, clear s1 and s2, no murmurs, rubs or gallops, no LE edema Pulmonary: scattered rhonci but good air movement Abdominal: non distended abdomen, soft and nontender Psych: Alert, nodding yes or no appropriately to questions   Assessment/Plan: Mr. Bartell is a 68 year old male who presented with a 2-day history of dysphasia after possible aspiration event at home. 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 Pt has residual deficits of dysphagia, R facial droop, and L hemiparesis.  Acute on chronic dysphagia  Modified barium swallow - oral deficits are severe and will be unfortunately prohibitive, it will likely be quite difficult for pt to meet his nutritional needs by PO alone - could reasonably consume nectar-thick liquids by teaspoon, both for pleasure and therapeutically  Spoke with the daughter over the phone today, we had an extensive conversation.  I relayed the patient's wishes to her.  She informed me that herself, the patient's brothers are in favor to proceed with PEG tube placement.  The family is in agreement, the patient is in agreement as well.  - consult IR for peg tube placement - NPO - aspiration precautions - continue PT/OT -  recommending home health  Hypertension   - hold home amlodipine 5mg   - PRN hydralazine 2mg  available  Seizure disorder  -continue keppra 500mg  BID IV (while pt NPO) -seizure precautions  hx of CVA - history of afib was highly in question more likely was sinus arrhythmia.  Stroke more consistent with small vessel disease.  Neurology felt due to bleeding risk, stroke presentation and questionable history of afib antiplatelet therapy would be more appropriate.  -continue aspirin 150mg  suppository  Hyperlipidemia -hold home lipitor 80mg  while NPO  History of ETOH abuse Unclear if he is currently using alcohol.  -CIWA    Diet - NPO Fluids - 73mL D5 1/2NS DVT ppx - enoxaparin 40mg  subQ daily CODE STATUS - FULL CODE  Vickki Muff MD PGY-3 Internal Medicine   03/13/2019, 10:28 AM  Dispo: Anticipated discharge in approximately 1-2 day(s) pending PEG placement. Live with daughter, [name] - [phone].  Katherine Roan, MD

## 2019-03-14 DIAGNOSIS — R64 Cachexia: Secondary | ICD-10-CM

## 2019-03-14 LAB — BASIC METABOLIC PANEL
Anion gap: 10 (ref 5–15)
BUN: <5 mg/dL — ABNORMAL LOW (ref 8–23)
CO2: 21 mmol/L — ABNORMAL LOW (ref 22–32)
Calcium: 8.8 mg/dL — ABNORMAL LOW (ref 8.9–10.3)
Chloride: 107 mmol/L (ref 98–111)
Creatinine, Ser: 0.86 mg/dL (ref 0.61–1.24)
GFR calc Af Amer: >60 mL/min (ref >60)
GFR calc non Af Amer: >60 mL/min (ref >60)
Glucose, Bld: 102 mg/dL — ABNORMAL HIGH (ref 70–99)
Potassium: 3.4 mmol/L — ABNORMAL LOW (ref 3.5–5.1)
Sodium: 138 mmol/L (ref 135–145)

## 2019-03-14 LAB — CBC
HCT: 43.4 % (ref 39.0–52.0)
Hemoglobin: 14.5 g/dL (ref 13.0–17.0)
MCH: 30.2 pg (ref 26.0–34.0)
MCHC: 33.4 g/dL (ref 30.0–36.0)
MCV: 90.4 fL (ref 80.0–100.0)
Platelets: 189 10*3/uL (ref 150–400)
RBC: 4.8 MIL/uL (ref 4.22–5.81)
RDW: 13.8 % (ref 11.5–15.5)
WBC: 5.8 10*3/uL (ref 4.0–10.5)
nRBC: 0 % (ref 0.0–0.2)

## 2019-03-14 LAB — PROTIME-INR
INR: 1.1 (ref 0.8–1.2)
Prothrombin Time: 13.8 seconds (ref 11.4–15.2)

## 2019-03-14 MED ORDER — POTASSIUM CHLORIDE 10 MEQ/100ML IV SOLN
10.0000 meq | INTRAVENOUS | Status: AC
  Administered 2019-03-14 (×4): 10 meq via INTRAVENOUS
  Filled 2019-03-14 (×4): qty 100

## 2019-03-14 MED ORDER — ENOXAPARIN SODIUM 40 MG/0.4ML ~~LOC~~ SOLN
40.0000 mg | SUBCUTANEOUS | Status: DC
  Administered 2019-03-17 – 2019-03-22 (×6): 40 mg via SUBCUTANEOUS
  Filled 2019-03-14 (×6): qty 0.4

## 2019-03-14 NOTE — Progress Notes (Signed)
  Date: 03/14/2019  Patient name: Miguel [name]  Medical record number: [id number]  Date of birth: 1951-01-19   I have seen and evaluated this patient and I have discussed the plan of care with the house staff. Please see Dr. Ronnald Ramp' note for complete details. I concur with her findings and plan.  Patient will go for PEG placement early this week.  Fatigue likely due to prolonged NPO (6 days in house).  Hopefully improved nutrition will improve his ability to work with therapy.     Sid Falcon, MD 03/14/2019, 11:50 AM

## 2019-03-14 NOTE — Progress Notes (Signed)
Subjective: Pt seen at the bedside this morning. Declining PT due to fatigue. Pt appears tired laying in bed. Denies pain or shortness of breath.   Objective:  Vital signs in last 24 hours: Vitals:   03/13/19 1947 03/13/19 2018 03/13/19 2316 03/14/19 0354  BP:  (!) 147/81 (!) 148/85 (!) 175/102  Pulse:  74 (!) 59 78  Resp:  14 18 16   Temp:  97.7 F (36.5 C) 97.7 F (36.5 C) 98 F (36.7 C)  TempSrc:  Oral Oral   SpO2: 98%   98%  Weight:      Height:       Physical Exam Vitals signs and nursing note reviewed.  Constitutional:      General: He is not in acute distress.    Appearance: He is cachectic. He is ill-appearing (chronically).  Cardiovascular:     Rate and Rhythm: Normal rate and regular rhythm.     Heart sounds: Normal heart sounds. No murmur. No friction rub. No gallop.   Pulmonary:     Effort: Pulmonary effort is normal. No respiratory distress.     Breath sounds: Normal breath sounds.  Abdominal:     General: Abdomen is flat. Bowel sounds are normal. There is no distension.     Palpations: Abdomen is soft.     Tenderness: There is no abdominal tenderness.  Musculoskeletal:     Right lower leg: No edema.     Left lower leg: No edema.  Skin:    General: Skin is warm and dry.  Neurological:     Mental Status: He is alert.     Cranial Nerves: Facial asymmetry (R facial droop) present.     Comments: Able to move all extremities though L weaker than R. Finger squeeze weaker than on admission (due to fatigue vs deconditioning?)  Psychiatric:        Mood and Affect: Affect is flat.     Assessment/Plan: Mr. Miguel [name] is a 68 year old male who presented with a 2-day history of dysphasia after possible aspiration event at home. 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 Pt has residual deficits of dysphagia, R facial droop, and L hemiparesis.  Acute on chronic dysphagia - Pt and family wish to proceed with PEG  tube placement for nutrition  Modified barium swallow - oral deficits are severe and will be unfortunately prohibitive, it will likely be quite difficult for pt to meet his nutritional needs by PO alone - could reasonably consume nectar-thick liquids by teaspoon, both for pleasure and therapeutically  - IR consult for PEG placement, likely 8/17 or 8/18  - repeat COVID testing negative  - hold enoxaparin the day of procedure  - NS confirmed no contraindication for G tube with existing VP shunt - NPO - aspiration precautions - continue PT/OT - recommending home health   Hypertension  - hold home amlodipine 5mg   - PRN hydralazine 2mg  available   Nutrition consulted, appreciate their recommendations. Malnutrition Type: Nutrition Problem: Severe Malnutrition Etiology: chronic illness(CVA) Malnutrition Characteristics: Signs/Symptoms: moderate fat depletion, severe fat depletion, moderate muscle depletion, severe muscle depletion Nutrition Interventions: Interventions: Refer to RD note for recommendations  Seizure disorder -continue keppra 500mg  BID IV (while pt NPO) -seizure precautions  History of CVA - stroke consistent with small vessel disease -continue aspirin 150mg  suppository  Hyperlipidemia -hold home lipitor 80mg   History of ETOH abuse Unclear if he is currently using alcohol.  -CIWA    Diet -  NPO Fluids - 26mL D5 1/2NS DVT ppx - enoxaparin 40mg  subQ daily CODE STATUS - FULL CODE   Dispo: Anticipated discharge in approximately 1-2 day(s) pending PEG placement. Live with daughter, [name] - [phone].   Ladona Horns, MD 03/14/2019, 7:20 AM Pager: [phone]

## 2019-03-14 NOTE — Progress Notes (Signed)
Physical Therapy Treatment Patient Details Name: Miguel [name] MRN: [id number] DOB: [birth year] Today's Date: 03/14/2019    History of Present Illness 68 year old male presents the ED for 2-day history of dysphasia.  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    PT Comments    Pt sleeping on arrival. Easily aroused and agreeable to therapy. He required min assist bed mobility, min assist sit to stand and min/mod assist ambulation 50 feet. Pt positioned in recliner at end of session. Pt is now agreeable to PEG tube. Plan is for PEG placement 8/17 vs 8/18.    Follow Up Recommendations  Home health PT;Supervision/Assistance - 24 hour     Equipment Recommendations  None recommended by PT    Recommendations for Other Services       Precautions / Restrictions Precautions Precautions: Fall;Other (comment) Precaution Comments: non-verbal at baseline due to previous CVA; communicates with yes/no    Mobility  Bed Mobility Overal bed mobility: Needs Assistance Bed Mobility: Supine to Sit     Supine to sit: Min assist     General bed mobility comments: +rail, increased time  Transfers Overall transfer level: Needs assistance Equipment used: Rolling walker (2 wheeled) Transfers: Sit to/from Stand Sit to Stand: Min assist         General transfer comment: assist to power up  Ambulation/Gait Ambulation/Gait assistance: Min assist;Mod assist Gait Distance (Feet): 50 Feet Assistive device: Rolling walker (2 wheeled) Gait Pattern/deviations: Step-to pattern;Decreased step length - left;Trunk flexed Gait velocity: decreased Gait velocity interpretation: <1.8 ft/sec, indicate of risk for recurrent falls General Gait Details: cues for step length LLE. Min assist for ambulation. LOB x 1 during turn/pivot requiring mod assist to recover balance.   Stairs              Wheelchair Mobility    Modified Rankin (Stroke Patients Only)       Balance Overall balance assessment: Needs assistance Sitting-balance support: No upper extremity supported;Feet supported Sitting balance-Leahy Scale: Fair     Standing balance support: Bilateral upper extremity supported;During functional activity Standing balance-Leahy Scale: Poor Standing balance comment: reliant on external support/RW                            Cognition Arousal/Alertness: Awake/alert Behavior During Therapy: Flat affect Overall Cognitive Status: Difficult to assess                                 General Comments: nonverbal at baseline; pt follows single step commands consistently      Exercises      General Comments        Pertinent Vitals/Pain Pain Assessment: No/denies pain    Home Living                      Prior Function            PT Goals (current goals can now be found in the care plan section) Acute Rehab PT Goals Patient Stated Goal: unable to verbalize PT Goal Formulation: With patient/family Time For Goal Achievement: 03/22/19 Potential to Achieve Goals: Good Progress towards PT goals: Progressing toward goals    Frequency    Min 3X/week      PT Plan Current plan remains appropriate    Co-evaluation  AM-PAC PT "6 Clicks" Mobility   Outcome Measure  Help needed turning from your back to your side while in a flat bed without using bedrails?: None Help needed moving from lying on your back to sitting on the side of a flat bed without using bedrails?: A Little Help needed moving to and from a bed to a chair (including a wheelchair)?: A Little Help needed standing up from a chair using your arms (e.g., wheelchair or bedside chair)?: A Little Help needed to walk in hospital room?: A Little Help needed climbing 3-5 steps with a railing? : Total 6 Click Score: 17    End of Session Equipment  Utilized During Treatment: Gait belt Activity Tolerance: Patient tolerated treatment well Patient left: in chair;with chair alarm set;with call bell/phone within reach Nurse Communication: Mobility status PT Visit Diagnosis: Other abnormalities of gait and mobility (R26.89);Muscle weakness (generalized) (M62.81)     Time: 9390-3009 PT Time Calculation (min) (ACUTE ONLY): 17 min  Charges:  $Gait Training: 8-22 mins                     Miguel [name], [place]  Office # [phone] Pager [phone]    Miguel [name] 03/14/2019, 1:10 PM

## 2019-03-15 ENCOUNTER — Encounter (HOSPITAL_COMMUNITY): Payer: Self-pay | Admitting: Interventional Radiology

## 2019-03-15 ENCOUNTER — Inpatient Hospital Stay (HOSPITAL_COMMUNITY): Payer: Medicare Other

## 2019-03-15 HISTORY — PX: IR GASTROSTOMY TUBE MOD SED: IMG625

## 2019-03-15 LAB — BASIC METABOLIC PANEL
Anion gap: 9 (ref 5–15)
BUN: <5 mg/dL — ABNORMAL LOW (ref 8–23)
CO2: 21 mmol/L — ABNORMAL LOW (ref 22–32)
Calcium: 8.9 mg/dL (ref 8.9–10.3)
Chloride: 108 mmol/L (ref 98–111)
Creatinine, Ser: 0.89 mg/dL (ref 0.61–1.24)
GFR calc Af Amer: >60 mL/min (ref >60)
GFR calc non Af Amer: >60 mL/min (ref >60)
Glucose, Bld: 101 mg/dL — ABNORMAL HIGH (ref 70–99)
Potassium: 3.4 mmol/L — ABNORMAL LOW (ref 3.5–5.1)
Sodium: 138 mmol/L (ref 135–145)

## 2019-03-15 LAB — CBC WITH DIFFERENTIAL/PLATELET
Abs Immature Granulocytes: 0 10*3/uL (ref 0.00–0.07)
Basophils Absolute: 0.1 10*3/uL (ref 0.0–0.1)
Basophils Relative: 1 %
Eosinophils Absolute: 0.4 10*3/uL (ref 0.0–0.5)
Eosinophils Relative: 7 %
HCT: 43 % (ref 39.0–52.0)
Hemoglobin: 14.6 g/dL (ref 13.0–17.0)
Immature Granulocytes: 0 %
Lymphocytes Relative: 47 %
Lymphs Abs: 2.4 10*3/uL (ref 0.7–4.0)
MCH: 30.4 pg (ref 26.0–34.0)
MCHC: 34 g/dL (ref 30.0–36.0)
MCV: 89.4 fL (ref 80.0–100.0)
Monocytes Absolute: 0.5 10*3/uL (ref 0.1–1.0)
Monocytes Relative: 10 %
Neutro Abs: 1.8 10*3/uL (ref 1.7–7.7)
Neutrophils Relative %: 35 %
Platelets: 180 10*3/uL (ref 150–400)
RBC: 4.81 MIL/uL (ref 4.22–5.81)
RDW: 13.9 % (ref 11.5–15.5)
WBC: 5.1 10*3/uL (ref 4.0–10.5)
nRBC: 0 % (ref 0.0–0.2)

## 2019-03-15 LAB — PROTIME-INR
INR: 1.1 (ref 0.8–1.2)
Prothrombin Time: 13.9 seconds (ref 11.4–15.2)

## 2019-03-15 LAB — SURGICAL PCR SCREEN
MRSA, PCR: NEGATIVE
Staphylococcus aureus: NEGATIVE

## 2019-03-15 MED ORDER — LIDOCAINE HCL 1 % IJ SOLN
INTRAMUSCULAR | Status: AC
  Filled 2019-03-15: qty 20

## 2019-03-15 MED ORDER — POTASSIUM CHLORIDE 10 MEQ/100ML IV SOLN
10.0000 meq | INTRAVENOUS | Status: AC
  Administered 2019-03-15 (×3): 10 meq via INTRAVENOUS
  Filled 2019-03-15 (×3): qty 100

## 2019-03-15 MED ORDER — IOHEXOL 300 MG/ML  SOLN
50.0000 mL | Freq: Once | INTRAMUSCULAR | Status: AC | PRN
  Administered 2019-03-15: 15 mL

## 2019-03-15 MED ORDER — MIDAZOLAM HCL 2 MG/2ML IJ SOLN
INTRAMUSCULAR | Status: AC
  Filled 2019-03-15: qty 4

## 2019-03-15 MED ORDER — FENTANYL CITRATE (PF) 100 MCG/2ML IJ SOLN
INTRAMUSCULAR | Status: AC
  Filled 2019-03-15: qty 2

## 2019-03-15 MED ORDER — LIDOCAINE HCL 1 % IJ SOLN
INTRAMUSCULAR | Status: AC | PRN
  Administered 2019-03-15: 10 mL

## 2019-03-15 MED ORDER — BACITRACIN-NEOMYCIN-POLYMYXIN OINTMENT TUBE
1.0000 "application " | TOPICAL_OINTMENT | Freq: Every day | CUTANEOUS | Status: AC
  Administered 2019-03-15 – 2019-03-21 (×7): 1 via TOPICAL
  Filled 2019-03-15: qty 14

## 2019-03-15 MED ORDER — FENTANYL CITRATE (PF) 100 MCG/2ML IJ SOLN
INTRAMUSCULAR | Status: AC | PRN
  Administered 2019-03-15: 50 ug via INTRAVENOUS

## 2019-03-15 MED ORDER — MIDAZOLAM HCL 2 MG/2ML IJ SOLN
INTRAMUSCULAR | Status: AC | PRN
  Administered 2019-03-15 (×2): 1 mg via INTRAVENOUS

## 2019-03-15 MED ORDER — GLUCAGON HCL RDNA (DIAGNOSTIC) 1 MG IJ SOLR
INTRAMUSCULAR | Status: AC
  Filled 2019-03-15: qty 1

## 2019-03-15 NOTE — Telephone Encounter (Signed)
Amy wants to know if you are the attending Physician for his hospice case.

## 2019-03-15 NOTE — Progress Notes (Signed)
Physical Therapy Treatment Patient Details Name: Miguel [name] MRN: [id number] DOB: 1950-08-03 Today's Date: 03/15/2019    History of Present Illness 68 year old male presents the ED for 2-day history of dysphasia.  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    PT Comments    Patient seen for mobility progression. Pt requires min guard-min A for functional transfers, short distance gait training, and stair training this session. Current plan remains appropriate.   Follow Up Recommendations  Home health PT;Supervision/Assistance - 24 hour     Equipment Recommendations  None recommended by PT    Recommendations for Other Services       Precautions / Restrictions Precautions Precautions: Fall;Other (comment) Precaution Comments: non-verbal at baseline due to previous CVA Restrictions Weight Bearing Restrictions: No    Mobility  Bed Mobility Overal bed mobility: Needs Assistance Bed Mobility: Supine to Sit     Supine to sit: Supervision     General bed mobility comments: +rail, increased time  Transfers Overall transfer level: Needs assistance Equipment used: Rolling walker (2 wheeled) Transfers: Sit to/from Stand Sit to Stand: Min assist         General transfer comment: assist to power up  Ambulation/Gait Ambulation/Gait assistance: Min assist Gait Distance (Feet): (30 ft then 20 ft) Assistive device: Rolling walker (2 wheeled) Gait Pattern/deviations: Step-to pattern;Decreased step length - left;Trunk flexed Gait velocity: decreased   General Gait Details: assist to steady and cues for step length symmetry   Stairs Stairs: Yes Stairs assistance: Min guard;Min assist Stair Management: Step to pattern;Two rails Number of Stairs: 2 General stair comments: min A to descend    Wheelchair Mobility    Modified Rankin (Stroke Patients Only)       Balance Overall balance assessment: Needs assistance Sitting-balance support: No upper extremity supported;Feet supported Sitting balance-Leahy Scale: Fair     Standing balance support: Bilateral upper extremity supported;During functional activity Standing balance-Leahy Scale: Poor Standing balance comment: reliant on external support/RW                            Cognition Arousal/Alertness: Awake/alert Behavior During Therapy: Flat affect Overall Cognitive Status: Difficult to assess                                 General Comments: nonverbal at baseline; pt follows single step commands consistently      Exercises      General Comments        Pertinent Vitals/Pain Faces Pain Scale: No hurt    Home Living                      Prior Function            PT Goals (current goals can now be found in the care plan section) Acute Rehab PT Goals Patient Stated Goal: unable to verbalize Progress towards PT goals: Progressing toward goals    Frequency    Min 3X/week      PT Plan Current plan remains appropriate    Co-evaluation              AM-PAC PT "6 Clicks" Mobility   Outcome Measure  Help needed turning from your back to your side while in a flat bed without using bedrails?: None Help needed moving from  lying on your back to sitting on the side of a flat bed without using bedrails?: A Little Help needed moving to and from a bed to a chair (including a wheelchair)?: A Little Help needed standing up from a chair using your arms (e.g., wheelchair or bedside chair)?: A Little Help needed to walk in hospital room?: A Little Help needed climbing 3-5 steps with a railing? : A Little 6 Click Score: 19    End of Session Equipment Utilized During Treatment: Gait belt Activity Tolerance: Patient tolerated treatment well Patient left: in chair;with call bell/phone within reach;Other (comment)(OT present) Nurse  Communication: Mobility status PT Visit Diagnosis: Other abnormalities of gait and mobility (R26.89);Muscle weakness (generalized) (M62.81)     Time: 6045-40981112-1138 PT Time Calculation (min) (ACUTE ONLY): 26 min  Charges:  $Gait Training: 23-37 mins                     Erline LevineKellyn Asser Lucena, PTA Acute Rehabilitation Services Pager: [phone] Office: [phone]     Miguel [name] 03/15/2019, 1:57 PM

## 2019-03-15 NOTE — Progress Notes (Signed)
Subjective: Pt seen at the bedside this morning. Sleeping. When awoken, nods head "yes" that he is feeling alright. Nods head "no" when asked if he was having any pain. Nods "yes" that he remembers walking with PT yesterday.  Objective:  Vital signs in last 24 hours: Vitals:   03/14/19 1943 03/14/19 2033 03/15/19 0016 03/15/19 0406  BP: (!) 164/73  137/85 (!) 154/84  Pulse: 64 67 63 79  Resp: 12 16 16 16   Temp: 97.9 F (36.6 C)  97.8 F (36.6 C) 98.1 F (36.7 C)  TempSrc: Oral  Oral Oral  SpO2: 99% 100% 96% 97%  Weight:      Height:       Physical Exam Constitutional:      General: He is not in acute distress.    Appearance: He is cachectic. He is ill-appearing (chronically).     Comments: Was sleeping upon entering the room. Awoke to voice, then was alert.  Cardiovascular:     Rate and Rhythm: Normal rate and regular rhythm.     Heart sounds: Normal heart sounds. No murmur. No friction rub. No gallop.   Pulmonary:     Effort: Pulmonary effort is normal. No tachypnea or respiratory distress.     Breath sounds: Normal breath sounds.     Comments: Scattered wheezing this morning. Abdominal:     General: Abdomen is flat. Bowel sounds are increased.     Palpations: Abdomen is soft.  Musculoskeletal:     Right lower leg: No edema.     Left lower leg: No edema.  Neurological:     Cranial Nerves: Facial asymmetry (R facial droop) present.     Comments: Shaking head yes and no.     Assessment/Plan: Mr. Bowley is a 68 year old male who presented with a 2-day history of dysphasia after possible aspiration event at home. 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 Pt has residual deficits of dysphagia, R facial droop, and L hemiparesis.  Acute on chronic dysphagia - Proceeding with PEG tube placement for nutrition  Modified barium swallow - oral deficits are severe and will be unfortunately prohibitive, it will likely be  quite difficult for pt to meet his nutritional needs by PO alone - could reasonably consume nectar-thick liquids by teaspoon, both for pleasure and therapeutically  - IR consult for PEG placement, likely today or tomorrow  - repeat COVID testing negative  - hold enoxaparin for procedure  - NS confirmed no contraindication for G tube with existing VP shunt - NPO - aspiration precautions - continue PT/OT - recommending home health - generalized deconditioning, pt has been NPO for 7 days, should improve with nutrition through PEG   Hypertension  - hold home amlodipine 5mg   - PRN hydralazine 2mg  available for SBP >170   Nutrition consulted, appreciate their recommendations. Malnutrition Type: Nutrition Problem: Severe Malnutrition Etiology: chronic illness(CVA) Malnutrition Characteristics: Signs/Symptoms: moderate fat depletion, severe fat depletion, moderate muscle depletion, severe muscle depletion Nutrition Interventions: Interventions: Refer to RD note for recommendations  Seizure disorder -continue keppra 500mg  BID IV (while pt NPO) -seizure precautions  History of CVA - stroke consistent with small vessel disease -continue aspirin 150mg  suppository  Hyperlipidemia -hold home lipitor 80mg   History of ETOH abuse Unclear if he is currently using alcohol. D/c CIWA, has been 0 since admission    Diet - NPO Fluids - [id number] D5 1/2NS DVT ppx - enoxaparin 40mg  subQ daily CODE STATUS -  FULL CODE   Dispo: Anticipated discharge in approximately 1-2 day(s) pending PEG placement. Live with daughter, [name] - [phone].   Ladona Horns, MD 03/15/2019, 6:36 AM Pager: [phone]

## 2019-03-15 NOTE — Progress Notes (Signed)
SLP Cancellation Note  Patient Details Name: Miguel [name] MRN: [id number] DOB: April 24, 1951   Cancelled treatment:        Attempted to see pt for swallowing therapy.  Pt is NPO for possible PEG placement today or tomorrow.  SLP will reattempt when medically appropriate.   Celedonio Savage, MA, [place] Office: [phone] 03/15/2019, 9:32 AM

## 2019-03-15 NOTE — Progress Notes (Signed)
  Date: 03/15/2019  Patient name: Miguel [name]  Medical record number: [id number]  Date of birth: [birth year]   I have seen and evaluated this patient and I have discussed the plan of care with the house staff. Please see their note for complete details. I concur with their findings.  Lenice Pressman, M.D., Ph.D. 03/15/2019, 11:36 AM

## 2019-03-15 NOTE — Progress Notes (Signed)
Pt back to room from IR; pt sleeping but arousal to voice and commands; PEG site dsg remains clean, dry and intact with no stain or active bleeding noted. PEG clamped; VSS; pt in bed with call light within reach and bed alarm on; Pt denies any pain. Will continue to closely monitor. Delia Heady RN   03/15/19 1716  Vital Signs  BP (!) 145/80  BP Location Right Arm  Patient Position (if appropriate) Lying  BP Method Automatic  Pulse Rate 75  Pulse Rate Source Monitor  Resp 18  Temp 97.8 F (36.6 C)  Temp Source Oral  Oxygen Therapy  SpO2 93 %  O2 Device Room Air

## 2019-03-15 NOTE — Progress Notes (Signed)
Occupational Therapy Treatment Patient Details Name: Miguel [name] MRN: [id number] DOB: [birth year] Today's Date: 03/15/2019    History of present illness 68 year old male presents the ED for 2-day history of dysphasia.  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   OT comments  Pt progressing and mobilizing with PT upon entering room. Pt given suction after mouthwashing teeth with toothette and pt able to suction mouth with LUE with minA for accuracy. Pt shaking head no when asked to stand at sink for grooming x2 times. Pt handed washcloth in sitting and pt performing face washing with LUE. Pt would greatly benefit from continued OT skilled services. Pt appears close to baseline and further treatment deferred as pt writing "i'm finished" on whiteboard during OT session when asked to perform additional ADL tasks. OT following acutely.     Follow Up Recommendations  Home health OT;Supervision/Assistance - 24 hour    Equipment Recommendations  None recommended by OT    Recommendations for Other Services      Precautions / Restrictions Precautions Precautions: Fall;Other (comment) Precaution Comments: non-verbal at baseline due to previous CVA Restrictions Weight Bearing Restrictions: No       Mobility Bed Mobility Overal bed mobility: Needs Assistance Bed Mobility: Supine to Sit     Supine to sit: Supervision     General bed mobility comments: in recliner.  Transfers Overall transfer level: Needs assistance Equipment used: Rolling walker (2 wheeled) Transfers: Sit to/from Stand Sit to Stand: Min assist         General transfer comment: assist to power up    Balance Overall balance assessment: Needs assistance Sitting-balance support: No upper extremity supported;Feet supported Sitting balance-Leahy Scale: Fair     Standing balance support: Bilateral upper  extremity supported;During functional activity Standing balance-Leahy Scale: Poor Standing balance comment: reliant on external support/RW                           ADL either performed or assessed with clinical judgement   ADL Overall ADL's : Needs assistance/impaired     Grooming: Set up;Sitting Grooming Details (indicate cue type and reason): Pt given suction after mouthwashing teeth with toothette and pt able to suction mouth with LUE with minA for accuracy. Pt shaking head no when asked to stand at sink for grooming x2 times. Pt handed washcloth in sitting and pt performing face washing with LUE.                             Functional mobility during ADLs: Minimal assistance;Rolling walker General ADL Comments: Pt requiring encouraging words to continue to mobilize.     Vision   Vision Assessment?: No apparent visual deficits   Perception     Praxis      Cognition Arousal/Alertness: Awake/alert Behavior During Therapy: Flat affect Overall Cognitive Status: Difficult to assess                                 General Comments: nonverbal at baseline; pt follows single step commands consistently        Exercises     Shoulder Instructions       General Comments      Pertinent Vitals/ Pain       Pain Assessment: Faces Faces Pain  Scale: No hurt Pain Intervention(s): Monitored during session  Home Living                                          Prior Functioning/Environment              Frequency  Min 2X/week        Progress Toward Goals  OT Goals(current goals can now be found in the care plan section)  Progress towards OT goals: Progressing toward goals  Acute Rehab OT Goals Patient Stated Goal: unable to verbalize OT Goal Formulation: With patient Time For Goal Achievement: 03/22/19 Potential to Achieve Goals: Good ADL Goals Pt Will Perform Grooming: with set-up;standing;sitting Pt Will  Perform Upper Body Bathing: with set-up;sitting Pt Will Perform Lower Body Bathing: with set-up;sit to/from stand;sitting/lateral leans Pt Will Perform Upper Body Dressing: with set-up;sitting Pt Will Perform Lower Body Dressing: with set-up;sitting/lateral leans;sit to/from stand Pt Will Transfer to Toilet: with set-up;with supervision;bedside commode;ambulating Pt Will Perform Tub/Shower Transfer: with set-up;shower seat;ambulating;rolling walker Pt/caregiver will Perform Home Exercise Program: Increased strength;Increased ROM;Left upper extremity Additional ADL Goal #1: Pt will use communication board/whiteboard to independently communicate needs to direct care  Plan Discharge plan remains appropriate;Frequency remains appropriate    Co-evaluation                 AM-PAC OT "6 Clicks" Daily Activity     Outcome Measure   Help from another person eating meals?: Total(NPO) Help from another person taking care of personal grooming?: None Help from another person toileting, which includes using toliet, bedpan, or urinal?: A Little Help from another person bathing (including washing, rinsing, drying)?: A Little Help from another person to put on and taking off regular upper body clothing?: None Help from another person to put on and taking off regular lower body clothing?: A Little 6 Click Score: 18    End of Session Equipment Utilized During Treatment: Gait belt;Rolling walker  OT Visit Diagnosis: Other abnormalities of gait and mobility (R26.89);Muscle weakness (generalized) (M62.81);Cognitive communication deficit (R41.841);Other symptoms and signs involving cognitive function Symptoms and signs involving cognitive functions: Cerebral infarction   Activity Tolerance Patient tolerated treatment well   Patient Left in chair;with call bell/phone within reach;with chair alarm set   Nurse Communication Mobility status        Time: [id number] OT Time Calculation (min): 19  min  Charges: OT General Charges $OT Visit: 1 Visit OT Treatments $Self Care/Home Management : 8-22 mins  Miguel [name] OTR/L Acute Rehabilitation Services Pager: [phone] Office: [phone]]    Lonzo CloudLLISON J Jacquan Savas 03/15/2019, 3:18 PM

## 2019-03-15 NOTE — Progress Notes (Signed)
Pt transported off unit via bed to IR for procedure. Delia Heady RN

## 2019-03-15 NOTE — Procedures (Signed)
Interventional Radiology Procedure Note  Procedure: Placement of percutaneous 20F pull-through gastrostomy tube. Complications: None Recommendations: - NPO except for sips and chips remainder of today and overnight - Maintain G-tube to LWS until tomorrow morning  - May advance diet as tolerated and begin using tube tomorrow morning  Signed,   Nell Schrack S. Asha Grumbine, DO   

## 2019-03-16 ENCOUNTER — Inpatient Hospital Stay (HOSPITAL_COMMUNITY): Payer: Medicare Other

## 2019-03-16 DIAGNOSIS — Z931 Gastrostomy status: Secondary | ICD-10-CM

## 2019-03-16 LAB — CBC
HCT: 42.3 % (ref 39.0–52.0)
Hemoglobin: 14.4 g/dL (ref 13.0–17.0)
MCH: 30.6 pg (ref 26.0–34.0)
MCHC: 34 g/dL (ref 30.0–36.0)
MCV: 89.8 fL (ref 80.0–100.0)
Platelets: 190 10*3/uL (ref 150–400)
RBC: 4.71 MIL/uL (ref 4.22–5.81)
RDW: 13.9 % (ref 11.5–15.5)
WBC: 13 10*3/uL — ABNORMAL HIGH (ref 4.0–10.5)
nRBC: 0 % (ref 0.0–0.2)

## 2019-03-16 LAB — COMPREHENSIVE METABOLIC PANEL
ALT: 22 U/L (ref 0–44)
AST: 24 U/L (ref 15–41)
Albumin: 3.2 g/dL — ABNORMAL LOW (ref 3.5–5.0)
Alkaline Phosphatase: 52 U/L (ref 38–126)
Anion gap: 9 (ref 5–15)
BUN: 5 mg/dL — ABNORMAL LOW (ref 8–23)
CO2: 26 mmol/L (ref 22–32)
Calcium: 8.9 mg/dL (ref 8.9–10.3)
Chloride: 104 mmol/L (ref 98–111)
Creatinine, Ser: 1.15 mg/dL (ref 0.61–1.24)
GFR calc Af Amer: >60 mL/min (ref >60)
GFR calc non Af Amer: >60 mL/min (ref >60)
Glucose, Bld: 114 mg/dL — ABNORMAL HIGH (ref 70–99)
Potassium: 3.5 mmol/L (ref 3.5–5.1)
Sodium: 139 mmol/L (ref 135–145)
Total Bilirubin: 1.1 mg/dL (ref 0.3–1.2)
Total Protein: 6 g/dL — ABNORMAL LOW (ref 6.5–8.1)

## 2019-03-16 LAB — MAGNESIUM: Magnesium: 1.7 mg/dL (ref 1.7–2.4)

## 2019-03-16 MED ORDER — FREE WATER
100.0000 mL | Freq: Once | Status: AC
  Administered 2019-03-16: 100 mL

## 2019-03-16 MED ORDER — ENOXAPARIN SODIUM 40 MG/0.4ML ~~LOC~~ SOLN
40.0000 mg | Freq: Once | SUBCUTANEOUS | Status: AC
  Administered 2019-03-16: 17:00:00 40 mg via SUBCUTANEOUS
  Filled 2019-03-16: qty 0.4

## 2019-03-16 MED ORDER — METOCLOPRAMIDE HCL 5 MG/ML IJ SOLN
10.0000 mg | Freq: Four times a day (QID) | INTRAMUSCULAR | Status: DC
  Administered 2019-03-16 – 2019-03-22 (×22): 10 mg via INTRAVENOUS
  Filled 2019-03-16 (×21): qty 2

## 2019-03-16 NOTE — Care Management Important Message (Signed)
Important Message  Patient Details  Name: Miguel [name] MRN: [id number] Date of Birth: 19-Mar-1951   Medicare Important Message Given:  Yes     Orbie Pyo 03/16/2019, 3:36 PM

## 2019-03-16 NOTE — Progress Notes (Signed)
Referring Physician(s): Dr Ezzie Dural  Supervising Physician: Corrie Mckusick  Patient Status:  [facility], [place] - In-pt  Chief Complaint:  Dysphagia   Subjective:  Percutaneous gastric tube placed 8/17 Resting Looks to have had some vomit previously---has some dried dark vomitus on blanket in bed  Allergies: Patient has no known allergies.  Medications: Prior to Admission medications   Medication Sig Start Date End Date Taking? Authorizing Provider  amLODipine (NORVASC) 5 MG tablet Take 1 tablet (5 mg total) by mouth daily. 01/06/19 04/06/19 Yes Charlott Rakes, MD  aspirin 81 MG EC tablet Take 1 tablet (81 mg total) by mouth daily. 12/20/18 03/20/19 Yes Dahal, Marlowe Aschoff, MD  atorvastatin (LIPITOR) 80 MG tablet Take 1 tablet (80 mg total) by mouth daily at 6 PM. 01/06/19 04/06/19 Yes Newlin, Charlane Ferretti, MD  folic acid (FOLVITE) 1 MG tablet Take 1 tablet (1 mg total) by mouth daily. 12/20/18 03/20/19 Yes Dahal, Marlowe Aschoff, MD  levETIRAcetam (KEPPRA) 500 MG tablet Take 1 tablet (500 mg total) by mouth 2 (two) times daily. 12/20/18 03/20/19 Yes Dahal, Marlowe Aschoff, MD  Multiple Vitamin (MULTIVITAMIN WITH MINERALS) TABS tablet Take 1 tablet by mouth daily. Patient taking differently: Take 1 tablet by mouth daily. Centrum Silver 03/25/17  Yes Mikhail, [place], DO  feeding supplement, ENSURE ENLIVE, (ENSURE ENLIVE) LIQD Take 237 mLs by mouth 2 (two) times daily between meals. Patient not taking: Reported on 12/16/2018 03/25/17   Cristal Ford, DO  polyethylene glycol powder (GLYCOLAX/MIRALAX) 17 GM/SCOOP powder Take 17 g by mouth daily. Patient not taking: Reported on 03/07/2019 01/06/19   Charlott Rakes, MD  thiamine 100 MG tablet Take 1 tablet (100 mg total) by mouth daily. Patient not taking: Reported on 12/16/2018 08/29/15   Oswald Hillock, MD     Vital Signs: BP (!) 143/67 (BP Location: Right Arm)    Pulse 97    Temp 98.5 F (36.9 C) (Oral)    Resp 16    Ht 6' (1.829 m)    Wt 146 lb 9.7 oz (66.5 kg)    SpO2 95%    BMI  19.88 kg/m   Physical Exam Vitals signs reviewed.  Abdominal:     General: Bowel sounds are normal.     Palpations: Abdomen is soft.  Skin:    General: Skin is warm and dry.     Comments: Site of G tube is clean and dry NT No hematoma     Imaging: Ct Abdomen Wo Contrast  Result Date: 03/13/2019 CLINICAL DATA:  68 year old male undergoing anatomic evaluation prior to percutaneous gastrostomy tube placement. EXAM: CT ABDOMEN WITHOUT CONTRAST TECHNIQUE: Multidetector CT imaging of the abdomen was performed following the standard protocol without IV contrast. COMPARISON:  None. FINDINGS: Lower chest: Mild dependent atelectasis. The visualized cardiac structures are normal in size. Unremarkable distal thoracic esophagus. Hepatobiliary: Normal hepatic contour and morphology. No discrete hepatic lesions. Normal appearance of the gallbladder. No intra or extrahepatic biliary ductal dilatation. Pancreas: Unremarkable. No pancreatic ductal dilatation or surrounding inflammatory changes. Spleen: Normal in size without focal abnormality. Adrenals/Urinary Tract: Unremarkable adrenal glands. No hydronephrosis or nephrolithiasis. The ureters are unremarkable. Stomach/Bowel: Normal gastric anatomy. No significant wall thickening or other un abnormality. Normal position of the colon. Vascular/Lymphatic: Limited evaluation in the absence of intravenous contrast. Scattered atherosclerotic calcifications. A non retrievable TrapEase IVC filter is present in the infrarenal IVC. Other: Incompletely imaged ventriculoperitoneal shunt catheter coursing along the subcutaneous fat of the right anterior abdominal wall before entering the peritoneum in the right  upper quadrant. The catheter tip is present in the left upper quadrant. No ascites. Musculoskeletal: No acute fracture or aggressive appearing lytic or blastic osseous lesion. IMPRESSION: 1. Suitable anatomy for percutaneous gastrostomy tube. 2. A ventriculoperitoneal  shunt catheter is present. 3. Non retrievable OptEase infrarenal IVC filter in place. 4.  Aortic Atherosclerosis (ICD10-170.0). Electronically Signed   By: Malachy MoanHeath  McCullough M.D.   On: 03/13/2019 13:22   Ir Gastrostomy Tube Mod Sed  Result Date: 03/15/2019 INDICATION: 68 year old male with a history of dysphagia EXAM: PERC PLACEMENT GASTROSTOMY MEDICATIONS: 2 g Ancef; Antibiotics were administered within 1 hour of the procedure. ANESTHESIA/SEDATION: Versed 2.0 mg IV; Fentanyl 50 mcg IV Moderate Sedation Time:  10 The patient was continuously monitored during the procedure by the interventional radiology nurse under my direct supervision. CONTRAST:  15mL OMNIPAQUE IOHEXOL 300 MG/ML SOLN - administered into the gastric lumen. FLUOROSCOPY TIME:  Fluoroscopy Time: 1 minutes 12 seconds (10 mGy). COMPLICATIONS: None PROCEDURE: Informed written consent was obtained from the patient and the patient's family after a thorough discussion of the procedural risks, benefits and alternatives. All questions were addressed. Maximal Sterile Barrier Technique was utilized including caps, mask, sterile gowns, sterile gloves, sterile drape, hand hygiene and skin antiseptic. A timeout was performed prior to the initiation of the procedure. The epigastrium was prepped with Betadine in a sterile fashion, and a sterile drape was applied covering the operative field. A sterile gown and sterile gloves were used for the procedure. A 5-French orogastric tube is placed under fluoroscopic guidance. Scout imaging of the abdomen confirms barium within the transverse colon. The stomach was distended with gas. Under fluoroscopic guidance, an 18 gauge needle was utilized to puncture the anterior wall of the body of the stomach. An Amplatz wire was advanced through the needle passing a T fastener into the lumen of the stomach. The T fastener was secured for gastropexy. A 9-French sheath was inserted. A snare was advanced through the 9-French  sheath. A Teena DunkBenson was advanced through the orogastric tube. It was snared then pulled out the oral cavity, pulling the snare, as well. The leading edge of the gastrostomy was attached to the snare. It was then pulled down the esophagus and out the percutaneous site. Tube secured in place. Contrast was injected. Patient tolerated the procedure well and remained hemodynamically stable throughout. No complications were encountered and no significant blood loss encountered. IMPRESSION: Status post fluoroscopic placed percutaneous gastrostomy tube, with 20 JamaicaFrench pull-through. Signed, Yvone NeuJaime S. Loreta AveWagner, DO [facility] Electronically Signed   By: Gilmer MorJaime  Wagner D.O.   On: 03/15/2019 16:17    Labs:  CBC: Recent Labs    03/08/19 0656 03/10/19 0333 03/14/19 0643 03/15/19 0843  WBC 7.1 5.5 5.8 5.1  HGB 14.9 14.5 14.5 14.6  HCT 44.4 42.3 43.4 43.0  PLT 138* 189 189 180    COAGS: Recent Labs    12/15/18 1612 03/14/19 0643 03/15/19 0843  INR 1.1 1.1 1.1  APTT 28  --   --     BMP: Recent Labs    03/12/19 0507 03/13/19 0417 03/14/19 0643 03/15/19 0843  NA 137 139 138 138  K 3.1* 3.8 3.4* 3.4*  CL 108 109 107 108  CO2 21* 21* 21* 21*  GLUCOSE 107* 93 102* 101*  BUN <5* <5* <5* <5*  CALCIUM 8.8* 8.9 8.8* 8.9  CREATININE 0.83 0.84 0.86 0.89  GFRNONAA >60 >60 >60 >60  GFRAA >60 >60 >60 >60  LIVER FUNCTION TESTS: Recent Labs    06/04/18 2034 06/17/18 0654 12/15/18 1612 03/07/19 1600  BILITOT 1.1 0.7 1.2 1.3*  AST 46* 31 18 21   ALT 21 22 19 22   ALKPHOS 51 54 42 69  PROT 7.1 7.5 6.4* 8.1  ALBUMIN 4.0 3.8 3.3* 4.0    Assessment and Plan:  May use G tube now  Electronically Signed: Robet LeuPamela A Chenae Brager, PA-C 03/16/2019, 6:49 AM   I spent a total of 15 Minutes at the the patient's bedside AND on the patient's hospital floor or unit, greater than 50% of which was counseling/coordinating care for G tube

## 2019-03-16 NOTE — Progress Notes (Signed)
Subjective: Pt seen at the bedside this morning. Nursing reports pt vomiting brown liquid x4 overnight and vomiting again this morning. Gave ondansetron 20 minutes prior to arrival in the room.PEG tube connected to low suction. Pulling dark brown/black contents.   Pt appears tired this morning. Endorses nausea and abdominal pain. Can not point to location of pain.   Objective:  Vital signs in last 24 hours: Vitals:   03/15/19 1925 03/15/19 2020 03/16/19 0007 03/16/19 0422  BP: 126/77  (!) 149/74 (!) 143/67  Pulse: 62 84 96 97  Resp: 18 16 16 16   Temp: (!) 97.5 F (36.4 C)  97.7 F (36.5 C) 98.5 F (36.9 C)  TempSrc: Oral  Oral Oral  SpO2: 100%  96% 95%  Weight:      Height:       Physical Exam Constitutional:      Appearance: He is cachectic. He is ill-appearing (chronically).  Cardiovascular:     Rate and Rhythm: Normal rate and regular rhythm.     Heart sounds: Normal heart sounds.  Pulmonary:     Effort: Pulmonary effort is normal. No tachypnea or respiratory distress.     Breath sounds: Normal breath sounds. Decreased air movement present.  Abdominal:     General: There is distension.     Palpations: Abdomen is soft.     Tenderness: There is no abdominal tenderness.     Comments: Bowel sounds present. Not high pitched. G-tube dressing clean and dry. Non-tender.  Musculoskeletal:     Right lower leg: No edema.     Left lower leg: No edema.     Comments: SCDs in place and functioning  Neurological:     Mental Status: He is alert.     Cranial Nerves: Facial asymmetry (R facial droop) present.     Assessment/Plan: Miguel [name] is a 68 year old male who presented with a 2-day history of dysphasia after possible aspiration event at home. 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 Pt has residual deficits of dysphagia, R facial droop, and L hemiparesis. Modified barium swallow on 8/13 showed severe oral deficits  making it difficult for pt to meet nutritional needs by PO alone.   Acute on chronic dysphagia - PEG tube placed yesterday afternoon Pt continuing to vomit this morning. PEG located in the stomach, draining gastric contents. Pt could have post-op ileus, though would be odd with conscious sedation used in IR procedure. Uncertain if suction was functioning appropriately overnight. - x-Odel of the abdomen - ondansetron 4mg  IV q6h PRN for nausea - ordered CBC, CMP and Mg - NPO - IR consulted for PEG placement, recommending G tube for use now - resume enoxaparin today - aspiration precautions - continue PT/OT - recommending home health - generalized deconditioning, pt has been NPO for entire hospitalization, should improve with nutrition through PEG   Nutrition consulted, appreciate their recommendations. Malnutrition Type: Nutrition Problem: Severe Malnutrition Etiology: chronic illness(CVA) Malnutrition Characteristics: Signs/Symptoms: moderate fat depletion, severe fat depletion, moderate muscle depletion, severe muscle depletion Nutrition Interventions: Interventions: Refer to RD note for recommendations  Hypertension  - hold home amlodipine 5mg   - PRN hydralazine 2mg  available for SBP >170  Seizure disorder -continue keppra 500mg  BID IV (while pt NPO) -seizure precautions  History of CVA - stroke consistent with small vessel disease -continue aspirin 150mg  suppository  Hyperlipidemia -hold home lipitor 80mg   Diet - NPO Fluids - 2mL D5 1/2NS DVT ppx - enoxaparin  40mg  subQ daily CODE STATUS - FULL CODE   Dispo: Anticipated discharge in approximately 1-2 day(s) pending ability to use PEG Live with daughter, Miguel [name] - [phone].   Miguel [name], Miguel Parlett, MD 03/16/2019, 6:44 AM Pager: [phone]

## 2019-03-16 NOTE — Progress Notes (Signed)
  Date: 03/16/2019  Patient name: Miguel [name]  Medical record number: [id number]  Date of birth: 03-21-1951   I have seen and evaluated this patient and I have discussed the plan of care with the house staff. Please see their note for complete details. I concur with their findings with the following additions/corrections:   G-tube placed by IR yesterday. Vomiting overnight and this morning, appears dark brown/black. Apparently, initiation of suction of G-tube yesterday was delayed, may have contributed to vomiting. Gave zofran, checking AXR - no acute findings, and labs - mild leukocytosis, mild increased Cr (does not meet AKI criteria). Will see how he does, delay initiation of tube feeds until vomiting has subsided and can take of wall suction.   Lenice Pressman, M.D., Ph.D. 03/16/2019, 1:44 PM

## 2019-03-16 NOTE — Progress Notes (Signed)
Pt vomited moderate amount of brownish liquidx4. PEG tube still on low suction till 9am.

## 2019-03-16 NOTE — Progress Notes (Signed)
SLP Cancellation Note  Patient Details Name: Miguel [name] MRN: [id number] DOB: 1950/08/22   Cancelled treatment:       Reason Eval/Treat Not Completed: Medical issues which prohibited therapy(Pt has been vomiting; not appropriate for PO trials today. )PEG placed yesterday.   Lilianne Delair L. Tivis Ringer, [place] Office number [phone] Pager [phone]  Juan Quam Laurice 03/16/2019, 10:31 AM

## 2019-03-16 NOTE — Progress Notes (Signed)
Pt vomited 3 times. Zofran was given. MD made aware.

## 2019-03-16 NOTE — Telephone Encounter (Signed)
This patient is still hospitalized, I am unaware of who referred him to hospice.

## 2019-03-17 LAB — GLUCOSE, CAPILLARY
Glucose-Capillary: 123 mg/dL — ABNORMAL HIGH (ref 70–99)
Glucose-Capillary: 113 mg/dL — ABNORMAL HIGH (ref 70–99)
Glucose-Capillary: 114 mg/dL — ABNORMAL HIGH (ref 70–99)
Glucose-Capillary: 121 mg/dL — ABNORMAL HIGH (ref 70–99)

## 2019-03-17 LAB — RENAL FUNCTION PANEL
Albumin: 2.8 g/dL — ABNORMAL LOW (ref 3.5–5.0)
Anion gap: 11 (ref 5–15)
BUN: 12 mg/dL (ref 8–23)
CO2: 23 mmol/L (ref 22–32)
Calcium: 8.4 mg/dL — ABNORMAL LOW (ref 8.9–10.3)
Chloride: 102 mmol/L (ref 98–111)
Creatinine, Ser: 1.06 mg/dL (ref 0.61–1.24)
GFR calc Af Amer: >60 mL/min (ref >60)
GFR calc non Af Amer: >60 mL/min (ref >60)
Glucose, Bld: 132 mg/dL — ABNORMAL HIGH (ref 70–99)
Phosphorus: 2.3 mg/dL — ABNORMAL LOW (ref 2.5–4.6)
Potassium: 3 mmol/L — ABNORMAL LOW (ref 3.5–5.1)
Sodium: 136 mmol/L (ref 135–145)

## 2019-03-17 MED ORDER — VITAL HIGH PROTEIN PO LIQD
1000.0000 mL | ORAL | Status: DC
  Filled 2019-03-17: qty 1000

## 2019-03-17 MED ORDER — POTASSIUM CHLORIDE 10 MEQ/100ML IV SOLN
10.0000 meq | INTRAVENOUS | Status: AC
  Administered 2019-03-17 – 2019-03-18 (×6): 10 meq via INTRAVENOUS
  Filled 2019-03-17 (×6): qty 100

## 2019-03-17 MED ORDER — OSMOLITE 1.5 CAL PO LIQD
1000.0000 mL | ORAL | Status: DC
  Administered 2019-03-17: 1000 mL
  Filled 2019-03-17: qty 1000

## 2019-03-17 NOTE — Progress Notes (Signed)
(  IM) Christian informed of pt's potassium 3.0.

## 2019-03-17 NOTE — Progress Notes (Signed)
   Subjective: Pt seen at the bedside this morning. Nods that he feels much improved from yesterday. Denies abdominal pain and nausea. Nursing reports that he has had no more vomiting episodes and the G tube had been clamped overnight.  Objective:  Vital signs in last 24 hours: Vitals:   03/16/19 1941 03/16/19 2013 03/16/19 2345 03/17/19 0351  BP:  128/66 (!) 141/75 (!) 101/54  Pulse:  (!) 107 99 87  Resp:  17 15   Temp:  98.2 F (36.8 C) 99.5 F (37.5 C) 99.2 F (37.3 C)  TempSrc:  Oral Oral Oral  SpO2: 98% 98%  95%  Weight:      Height:        Physical Exam Vitals signs and nursing note reviewed.  Constitutional:      General: He is not in acute distress.    Appearance: He is cachectic.  HENT:     Mouth/Throat:     Comments: Drooling. Cardiovascular:     Rate and Rhythm: Normal rate and regular rhythm.  Pulmonary:     Effort: Pulmonary effort is normal. No tachypnea or respiratory distress.  Abdominal:     Comments: G tube dressing dry and in place. Distension much improved from yesterday.  Musculoskeletal:     Right lower leg: No edema.     Left lower leg: No edema.  Neurological:     Mental Status: He is alert.     Cranial Nerves: Facial asymmetry (R facial droop) present.      Assessment/Plan: Miguel [name] is a 68 year old male who presented with a 2-day history of dysphasia after possible aspiration event at home. PMH significant for CVA in May of 2020, with residual deficits of dysphagia, R facial droop, and L hemiparesis. Modified barium swallow on 8/13 showed severe oral deficits making it difficult for pt to meet nutritional needs by PO alone. G tube placed on 8/17 for long-term nutrition.   Acute on chronic dysphagia - PEG tube placed 8/17 Pt's vomiting resolved and feeling much improved. Abdominal x-Miguel [name] with no signs of bowel obstruction. CMP, CBC, and Mg yesterday unremarkable. - IR consulted for PEG placement, recommending G tube for use 8/18 - will  begin trickle feeds (18mL/hr) through the tube today - dietitian consulted for tube feeding management  - check BMP and Phos, as pt is at high risk for refeeding syndrome  - metocloprimide 5mg  IV q6h for nausea - aspiration precautions - continue PT/OT - recommending home health - generalized deconditioning, should improve with nutrition through PEG   Hypertension  - hold home amlodipine 5mg   - PRN hydralazine 2mg  available for SBP >170  Seizure disorder -continue keppra 500mg  BID IV (while pt NPO) -seizure precautions  History of CVA - stroke consistent with small vessel disease -continue aspirin 150mg  suppository  Hyperlipidemia -hold home lipitor 80mg   Diet - begin trickle feeds today Fluids - 97mL D5 1/2NS, can decrease as pt begins to get more intake from G tube DVT ppx - enoxaparin 40mg  subQ daily CODE STATUS - FULL CODE   Dispo: Anticipated discharge in approximately 1-2 day(s) Live with daughter, Miguel [name] - [phone].   Miguel Horns, MD 03/17/2019, 6:33 AM Pager: [phone]

## 2019-03-17 NOTE — Progress Notes (Signed)
Nutrition Follow-up  DOCUMENTATION CODES:   Severe malnutrition in context of chronic illness  INTERVENTION:   Trickle tube feeds using Osmolite 1.5 formula via G-tube at rate of 20 ml/hr.    Monitor magnesium, potassium, and phosphorus daily for at least 3 days, MD to replete as needed, as pt is at risk for refeeding syndrome given severe malnutrition and little to no nutrition since admission.   Once able to advance past trickle feeds, recommend advancing Osmolite 1.5 formula by 10 ml every 8 hours to goal rate of 60 ml/hr.   Recommend 30 ml Prostat once daily per tube. Tube feeding regimen at goal rate to provide 2260 kcal (100% of needs), 105 grams of protein, and 1094 ml water.    Once IV fluids are discontinued, recommend free water flushes of 200 ml given QID.   NUTRITION DIAGNOSIS:   Severe Malnutrition related to chronic illness(CVA) as evidenced by moderate fat depletion, severe fat depletion, moderate muscle depletion, severe muscle depletion; ongoing  GOAL:   Patient will meet greater than or equal to 90% of their needs; progressing  MONITOR:   TF tolerance, Skin, Weight trends, Labs, I & O's  REASON FOR ASSESSMENT:   Malnutrition Screening Tool    ASSESSMENT:   68 year old male presents the ED for 2-day history of dysphasia.  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  Pt and family agreeable to G-tube for enteral nutrition. G-tube placed 8/17. Pt continues on NPO status.  Per MD, will begin trickle feeds today to ensure tolerance as pt at refeeding risk due given little to no nutrition to admission and severe malnutrition. Recommendations for tube feeding advancement stated above once able to advance past trickle feeds. RD to continue to monitor.   Labs and medications reviewed.   Diet Order:   Diet Order            Diet NPO time specified  Diet  effective midnight              EDUCATION NEEDS:   Not appropriate for education at this time  Skin:  Skin Assessment: Reviewed RN Assessment  Last BM:  8/16  Height:   Ht Readings from Last 1 Encounters:  03/11/19 6' (1.829 m)    Weight:   Wt Readings from Last 1 Encounters:  03/07/19 66.5 kg    Ideal Body Weight:  80.9 kg  BMI:  Body mass index is 19.88 kg/m.  Estimated Nutritional Needs:   Kcal:  2000-2200  Protein:  100-115 grams  Fluid:  > 2.0 L    Corrin Parker, MS, RD, LDN Pager # [phone] After hours/ weekend pager # [phone]

## 2019-03-17 NOTE — Progress Notes (Signed)
Occupational Therapy Treatment Patient Details Name: Miguel DohenyRay J Ingraham MRN: [id number] DOB: 07-May-1951 Today's Date: 03/17/2019    History of present illness 68 year old male presents the ED for 2-day history of dysphasia.  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   OT comments  Pt with gradual progress towards OT goals, presenting with increased fatigue during mobility today. Pt standing with PT upon arrival to room having just exited bathroom. Pt with increased fatigue and imbalance and required seated rest break prior to full transition to sitting EOB. Use of two person assist for safe completion of additional mobility using RW end of session to return to EOB/supine. Pt overall following simple commands and intermittently nodding head yes/no in response to therapist questions. Given pt's continued weakness recommend pt have hands on 24hr supervision/assist at time of discharge to ensure his safety during ADL and mobility tasks. If pt doesn't have 24hr assist may need to consider post acute therapy services. Will continue per POC.   Follow Up Recommendations  Home health OT;Supervision/Assistance - 24 hour    Equipment Recommendations  None recommended by OT          Precautions / Restrictions Precautions Precautions: Fall;Other (comment) Precaution Comments: non-verbal at baseline due to previous CVA Restrictions Weight Bearing Restrictions: No       Mobility Bed Mobility Overal bed mobility: Needs Assistance Bed Mobility: Sit to Supine       Sit to supine: Min assist   General bed mobility comments: light assist for LEs onto EOB, assist for straightening/positioning once in supine  Transfers Overall transfer level: Needs assistance Equipment used: Rolling walker (2 wheeled) Transfers: Sit to/from Stand Sit to Stand: Min assist;Mod assist;+2 physical assistance;+2  safety/equipment         General transfer comment: increased boosting assist to rise from straight back chair without armrests, VCs for safe hand placement on RW    Balance Overall balance assessment: Needs assistance Sitting-balance support: No upper extremity supported;Feet supported Sitting balance-Leahy Scale: Fair     Standing balance support: Bilateral upper extremity supported;During functional activity Standing balance-Leahy Scale: Poor Standing balance comment: reliant on external support/RW                           ADL either performed or assessed with clinical judgement   ADL Overall ADL's : Needs assistance/impaired     Grooming: Set up;Sitting                               Functional mobility during ADLs: Minimal assistance;Rolling walker;Moderate assistance;+2 for physical assistance;+2 for safety/equipment General ADL Comments: pt exiting bathroom with PT upon arrival, required additional assist as pt with increased fatigue and difficulty making it back to sit on EOB, assisted pt to sit in straight back chair, then after seated rest assisted with transfer back to bed      Vision       Perception     Praxis      Cognition Arousal/Alertness: Awake/alert Behavior During Therapy: Flat affect Overall Cognitive Status: Difficult to assess                                 General Comments: nonverbal at baseline; pt follows single step commands for the majority, nodding  head yes/no intermittently given increased time/cues        Exercises Exercises: General Upper Extremity General Exercises - Upper Extremity Shoulder Flexion: AAROM;Left;Supine;5 reps Elbow Flexion: AAROM;Left;10 reps;Supine Elbow Extension: AAROM;Left;10 reps;Supine   Shoulder Instructions       General Comments      Pertinent Vitals/ Pain       Pain Assessment: Faces Faces Pain Scale: No hurt Pain Intervention(s): Monitored during  session  Home Living                                          Prior Functioning/Environment              Frequency  Min 2X/week        Progress Toward Goals  OT Goals(current goals can now be found in the care plan section)  Progress towards OT goals: OT to reassess next treatment  Acute Rehab OT Goals Patient Stated Goal: unable to verbalize OT Goal Formulation: With patient Time For Goal Achievement: 03/22/19 Potential to Achieve Goals: Good ADL Goals Pt Will Perform Grooming: with set-up;standing;sitting Pt Will Perform Upper Body Bathing: with set-up;sitting Pt Will Perform Lower Body Bathing: with set-up;sit to/from stand;sitting/lateral leans Pt Will Perform Upper Body Dressing: with set-up;sitting Pt Will Perform Lower Body Dressing: with set-up;sitting/lateral leans;sit to/from stand Pt Will Transfer to Toilet: with set-up;with supervision;bedside commode;ambulating Pt Will Perform Tub/Shower Transfer: with set-up;shower seat;ambulating;rolling walker Pt/caregiver will Perform Home Exercise Program: Increased strength;Increased ROM;Left upper extremity Additional ADL Goal #1: Pt will use communication board/whiteboard to independently communicate needs to direct care  Plan Discharge plan remains appropriate;Frequency remains appropriate    Co-evaluation    PT/OT/SLP Co-Evaluation/Treatment: Yes(overlap with PT) Reason for Co-Treatment: For patient/therapist safety   OT goals addressed during session: ADL's and self-care      AM-PAC OT "6 Clicks" Daily Activity     Outcome Measure   Help from another person eating meals?: Total(NPO) Help from another person taking care of personal grooming?: None Help from another person toileting, which includes using toliet, bedpan, or urinal?: A Little Help from another person bathing (including washing, rinsing, drying)?: A Little Help from another person to put on and taking off regular upper body  clothing?: None Help from another person to put on and taking off regular lower body clothing?: A Little 6 Click Score: 18    End of Session Equipment Utilized During Treatment: Rolling walker  OT Visit Diagnosis: Other abnormalities of gait and mobility (R26.89);Muscle weakness (generalized) (M62.81);Cognitive communication deficit (R41.841);Other symptoms and signs involving cognitive function Symptoms and signs involving cognitive functions: Cerebral infarction   Activity Tolerance Patient tolerated treatment well   Patient Left in bed;with bed alarm set;with call bell/phone within reach   Nurse Communication Mobility status        Time: 3329-5188 OT Time Calculation (min): 23 min  Charges: OT General Charges $OT Visit: 1 Visit OT Treatments $Therapeutic Activity: 8-22 mins  Lou Cal, [place] Pager [phone] Office [place] 03/17/2019, 3:38 PM

## 2019-03-17 NOTE — Progress Notes (Signed)
Physical Therapy Treatment Patient Details Name: Miguel [name] MRN: [id number] DOB: 14-Jul-1951 Today's Date: 03/17/2019    History of Present Illness 68 year old male presents the ED for 2-day history of dysphasia.  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    PT Comments    Patient seen for mobility progression. Pt requires min/mod A (+2 for safety and/or physical assist) for short distance gait and functional transfer training this session. Pt ambulated to bathroom and became fatigued and with LOB one the way back to EOB. Chair brought up behind pt to prevent fall and then able to SPT back to bed after rest. Continue to progress as tolerated.     Follow Up Recommendations  Home health PT;Supervision/Assistance - 24 hour     Equipment Recommendations  None recommended by PT    Recommendations for Other Services       Precautions / Restrictions Precautions Precautions: Fall;Other (comment) Precaution Comments: non-verbal at baseline due to previous CVA Restrictions Weight Bearing Restrictions: No    Mobility  Bed Mobility Overal bed mobility: Needs Assistance Bed Mobility: Sit to Supine;Supine to Sit     Supine to sit: Min assist;HOB elevated Sit to supine: Min assist   General bed mobility comments: cues for sequencing/reaching for bed rail; assist to elevate trunk into sitting; light assist for LEs onto EOB, assist for straightening/positioning once in supine  Transfers Overall transfer level: Needs assistance Equipment used: Rolling walker (2 wheeled) Transfers: Sit to/from Stand Sit to Stand: Min assist;Mod assist;+2 physical assistance;+2 safety/equipment         General transfer comment: mod A for initial stand from EOB; increased boosting assist to rise from straight back chair without armrests, VCs for safe hand placement on  RW  Ambulation/Gait Ambulation/Gait assistance: Min assist;Mod assist Gait Distance (Feet): (~18 ft total) Assistive device: Rolling walker (2 wheeled) Gait Pattern/deviations: Step-to pattern;Decreased step length - left;Decreased stride length;Trunk flexed     General Gait Details: cues and assistance for safe use of AD and sequencing; assistance required to balance; pt ambulated to bathroom and then almost back to EOB until pt became fatigued, lost balance, and required +2 to maintain balance while chair brought up behind   Stairs             Wheelchair Mobility    Modified Rankin (Stroke Patients Only)       Balance Overall balance assessment: Needs assistance Sitting-balance support: No upper extremity supported;Feet supported Sitting balance-Leahy Scale: Fair     Standing balance support: Bilateral upper extremity supported;During functional activity Standing balance-Leahy Scale: Poor Standing balance comment: reliant on external support/RW                            Cognition Arousal/Alertness: Awake/alert Behavior During Therapy: Flat affect Overall Cognitive Status: Difficult to assess                                 General Comments: nonverbal at baseline; pt follows single step commands for the majority, nodding head yes/no intermittently given increased time/cues      Exercises General Exercises - Upper Extremity Shoulder Flexion: AAROM;Left;Supine;5 reps Elbow Flexion: AAROM;Left;10 reps;Supine Elbow Extension: AAROM;Left;10 reps;Supine    General Comments        Pertinent Vitals/Pain Pain Assessment: Faces Faces Pain Scale: No hurt  Pain Intervention(s): Monitored during session    Home Living                      Prior Function            PT Goals (current goals can now be found in the care plan section) Acute Rehab PT Goals Patient Stated Goal: unable to verbalize Progress towards PT goals:  Progressing toward goals    Frequency    Min 3X/week      PT Plan Current plan remains appropriate    Co-evaluation PT/OT/SLP Co-Evaluation/Treatment: Yes Reason for Co-Treatment: For patient/therapist safety   OT goals addressed during session: ADL's and self-care      AM-PAC PT "6 Clicks" Mobility   Outcome Measure  Help needed turning from your back to your side while in a flat bed without using bedrails?: None Help needed moving from lying on your back to sitting on the side of a flat bed without using bedrails?: A Little Help needed moving to and from a bed to a chair (including a wheelchair)?: A Little Help needed standing up from a chair using your arms (e.g., wheelchair or bedside chair)?: A Little Help needed to walk in hospital room?: A Lot Help needed climbing 3-5 steps with a railing? : A Lot 6 Click Score: 17    End of Session Equipment Utilized During Treatment: Gait belt Activity Tolerance: Patient tolerated treatment well Patient left: in bed;with call bell/phone within reach;with bed alarm set;with SCD's reapplied Nurse Communication: Mobility status PT Visit Diagnosis: Other abnormalities of gait and mobility (R26.89);Muscle weakness (generalized) (M62.81)     Time: [id number] PT Time Calculation (min) (ACUTE ONLY): 43 min  Charges:  $Gait Training: 23-37 mins                     Erline LevineKellyn Reshard Guillet, PTA Acute Rehabilitation Services Pager: [phone] Office: [phone]     Carolynne EdouardKellyn R Bertine Schlottman 03/17/2019, 4:06 PM

## 2019-03-17 NOTE — Progress Notes (Signed)
  Date: 03/17/2019  Patient name: Miguel [name]  Medical record number: [id number]  Date of birth: 08-Apr-1951   I have seen and evaluated this patient and I have discussed the plan of care with the house staff. Please see their note for complete details. I concur with their findings with the following additions/corrections:   No further vomiting, tolerating PEG tube clamped, will start and slowly advance feeds today. Once tolerating full feeds, will be able to stop IV fluids and should be ready for discharge.  Lenice Pressman, M.D., Ph.D. 03/17/2019, 11:35 AM

## 2019-03-17 NOTE — Plan of Care (Signed)
  Problem: Nutrition: Goal: Dietary intake will improve Outcome: Progressing   Problem: Clinical Measurements: Goal: Will remain free from infection Outcome: Progressing

## 2019-03-18 LAB — GLUCOSE, CAPILLARY
Glucose-Capillary: 130 mg/dL — ABNORMAL HIGH (ref 70–99)
Glucose-Capillary: 126 mg/dL — ABNORMAL HIGH (ref 70–99)
Glucose-Capillary: 126 mg/dL — ABNORMAL HIGH (ref 70–99)
Glucose-Capillary: 131 mg/dL — ABNORMAL HIGH (ref 70–99)
Glucose-Capillary: 115 mg/dL — ABNORMAL HIGH (ref 70–99)

## 2019-03-18 LAB — RENAL FUNCTION PANEL
Albumin: 2.6 g/dL — ABNORMAL LOW (ref 3.5–5.0)
Anion gap: 7 (ref 5–15)
BUN: 8 mg/dL (ref 8–23)
CO2: 25 mmol/L (ref 22–32)
Calcium: 8.4 mg/dL — ABNORMAL LOW (ref 8.9–10.3)
Chloride: 104 mmol/L (ref 98–111)
Creatinine, Ser: 0.88 mg/dL (ref 0.61–1.24)
GFR calc Af Amer: >60 mL/min (ref >60)
GFR calc non Af Amer: >60 mL/min (ref >60)
Glucose, Bld: 132 mg/dL — ABNORMAL HIGH (ref 70–99)
Phosphorus: 2.3 mg/dL — ABNORMAL LOW (ref 2.5–4.6)
Potassium: 3.7 mmol/L (ref 3.5–5.1)
Sodium: 136 mmol/L (ref 135–145)

## 2019-03-18 LAB — MAGNESIUM: Magnesium: 1.8 mg/dL (ref 1.7–2.4)

## 2019-03-18 MED ORDER — SODIUM PHOSPHATES 45 MMOLE/15ML IV SOLN
20.0000 mmol | Freq: Once | INTRAVENOUS | Status: AC
  Administered 2019-03-18: 20 mmol via INTRAVENOUS
  Filled 2019-03-18: qty 6.67

## 2019-03-18 MED ORDER — PRO-STAT SUGAR FREE PO LIQD
30.0000 mL | Freq: Every day | ORAL | Status: DC
  Administered 2019-03-18 – 2019-03-22 (×5): 30 mL
  Filled 2019-03-18 (×4): qty 30

## 2019-03-18 MED ORDER — OSMOLITE 1.5 CAL PO LIQD
474.0000 mL | Freq: Three times a day (TID) | ORAL | Status: DC
  Administered 2019-03-18 – 2019-03-22 (×12): 474 mL
  Filled 2019-03-18 (×14): qty 474

## 2019-03-18 MED ORDER — FREE WATER
200.0000 mL | Freq: Four times a day (QID) | Status: DC
  Administered 2019-03-19 – 2019-03-22 (×14): 200 mL

## 2019-03-18 NOTE — Progress Notes (Signed)
  Date: 03/18/2019  Patient name: ORIE BAXENDALE  Medical record number: [id number]  Date of birth: July 06, 1951   I have seen and evaluated this patient and I have discussed the plan of care with the house staff. Please see their note for complete details. I concur with their findings with the following additions/corrections:   Doing well on tube feeds, no report of vomiting. At 60 cc/hr continuous, now would like to consolidate to bolus feeds so he can have some time off the pump. Can also add free water flushes and stop IV fluids. Given his prolonged severe malnutrition with minimal caloric intake, he is at high risk for refeeding syndrome and will need electrolyte monitoring daily for at least one more day. Could potentially go home after that if family is educated regarding use of pump and home health set up.  Lenice Pressman, M.D., Ph.D. 03/18/2019, 2:18 PM

## 2019-03-18 NOTE — Progress Notes (Signed)
Subjective: Pt seen at the bedside this morning. Nods he is feeling well. Denies abdominal pain, nausea, or vomiting. Tube feeds on at goal rate of 8160mL/hr.  Objective:  Vital signs in last 24 hours: Vitals:   03/17/19 2001 03/17/19 2326 03/18/19 0403 03/18/19 0525  BP:  140/82 117/63   Pulse: 90 94 78   Resp: 20 17 16    Temp:  99 F (37.2 C) 99.1 F (37.3 C)   TempSrc:  Oral Oral   SpO2: 95% 96% 94%   Weight:    66.7 kg  Height:        Physical Exam Vitals signs and nursing note reviewed.  Cardiovascular:     Rate and Rhythm: Normal rate and regular rhythm.     Heart sounds: Normal heart sounds.  Pulmonary:     Effort: Pulmonary effort is normal. No respiratory distress.     Breath sounds: Normal breath sounds.  Abdominal:     General: Abdomen is flat. Bowel sounds are normal.     Comments: G tube dressing clean, dry and intact  Musculoskeletal:     Right lower leg: No edema.     Left lower leg: No edema.  Neurological:     Mental Status: He is alert.     Cranial Nerves: Facial asymmetry (R facial droop) present.  Psychiatric:        Mood and Affect: Affect is flat.     Assessment/Plan: Miguel [name] is a 68 year old male who presented with a 2-day history of dysphasia after possible aspiration event at home. CVA in May of 2020 with residual deficits of dysphagia, R facial droop, and L hemiparesis. Modified barium swallow on 8/13 showed severe oral deficits making it difficult for pt to meet nutritional needs by PO alone. G tube placed on 8/17 and feedings began 8/19.   Acute on chronic dysphagia - PEG tube placed 8/17 - IR consulted for PEG placement, recommending G tube for use 8/18 - began trickle feeds yesterday, at goal rate this morning  Dietitian consulted for tube feeding management  - Monitor magnesium, potassium, and phosphorus daily for at least 3 days, MD to replete as needed, as pt is at risk for refeeding syndrome given severe malnutrition and little  to no nutrition since admission. - Once able to advance past trickle feeds, recommend advancing Osmolite 1.5 formula by 10 ml every 8 hours to goal rate of 60 ml/hr. - Recommend 30 ml Prostat once daily per tube. Tube feeding regimen at goal rate to provide 2260 kcal (100% of needs), 105 grams of protein, and 1094 ml water.  - Once IV fluids are discontinued, recommend free water flushes of 200 ml given QID.    - K 3.7, Phos 2.3, and Mg pending this morning  - replete Phos with 20mEq IV  - aspiration precautions - continue PT/OT - recommending home health   Hypertension  - hold home amlodipine 5mg   - PRN hydralazine 2mg  available for SBP >170  Seizure disorder -continue keppra 500mg  BID IV (while pt NPO) -seizure precautions  History of CVA - stroke consistent with small vessel disease -continue aspirin 150mg  suppository  Hyperlipidemia -hold home lipitor 80mg   Diet - tube feeds as above Fluids - d/c IV fluids, free water flushes 200mL QID DVT ppx - enoxaparin 40mg  subQ daily CODE STATUS - FULL CODE   Dispo: Anticipated discharge in approximately 3 days due to monitoring for refeeding syndrome Live with daughter, Archie Pattenonya - [phone].   Thom ChimesJones, Terence Bart,  MD 03/18/2019, 6:36 AM Pager: [phone]

## 2019-03-18 NOTE — Progress Notes (Signed)
Nutrition Follow-up   RD working remotely.  DOCUMENTATION CODES:   Severe malnutrition in context of chronic illness  INTERVENTION:  Transition to bolus tube feeds via PEG: At 1800, start bolus feed using Osmolite 1.5 formula at volume of 120 ml (half carton/ARC) and advanced by 120 ml every feeding to goal volume of 474 ml (2 cartons/ARCs) given TID.   Provide 30 ml Prostat (or equivalent) once daily per tube.   Once IV fluids are discontinued, provide free water flushes of 200 ml given QID in between bolus feeds.   Tube feeding regimen to provide 2233 kcal (100% of needs), 104 grams of protein, and 1881 ml water.   NUTRITION DIAGNOSIS:   Severe Malnutrition related to chronic illness(CVA) as evidenced by moderate fat depletion, severe fat depletion, moderate muscle depletion, severe muscle depletion; ongoing  GOAL:   Patient will meet greater than or equal to 90% of their needs; met with TF  MONITOR:   TF tolerance, Skin, Weight trends, Labs, I & O's  REASON FOR ASSESSMENT:   Malnutrition Screening Tool    ASSESSMENT:   68 year old male presents the ED for 2-day history of dysphasia.  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 PEG placed  8/17.  Pt has been tolerating his trickle tube feeds. Tube feeds rate to increase to goal of 60 ml/hr today. Refeeding labs monitored by MD. Plan for one more day of refeeding labs. RD contacted by MD regarding plans to transition to bolus tube feeds for preparation for home. Family to be educated on bolus tube feed administration and PEG care prior to pt discharge home. RD to continue to monitor for tolerance.   Labs and medications reviewed. Phosphorous low at 2.3. Potassium and magnesium WNL.   Diet Order:   Diet Order            Diet NPO time specified  Diet effective midnight              EDUCATION NEEDS:   Not  appropriate for education at this time  Skin:  Skin Assessment: Reviewed RN Assessment  Last BM:  8/16  Height:   Ht Readings from Last 1 Encounters:  03/11/19 6' (1.829 m)    Weight:   Wt Readings from Last 1 Encounters:  03/18/19 66.7 kg    Ideal Body Weight:  80.9 kg  BMI:  Body mass index is 19.94 kg/m.  Estimated Nutritional Needs:   Kcal:  2000-2200  Protein:  100-115 grams  Fluid:  > 2.0 L    Corrin Parker, MS, RD, LDN Pager # [phone] After hours/ weekend pager # [phone]

## 2019-03-18 NOTE — Plan of Care (Signed)
Problem: Education: Goal: Knowledge of disease or condition will improve Outcome: Progressing Goal: Knowledge of secondary prevention will improve Outcome: Progressing Goal: Knowledge of patient specific risk factors addressed and post discharge goals established will improve Outcome: Progressing Goal: Individualized Educational Video(s) Outcome: Progressing   Problem: Coping: Goal: Will verbalize positive feelings about self Outcome: Progressing Goal: Will identify appropriate support needs Outcome: Progressing   Problem: Health Behavior/Discharge Planning: Goal: Ability to manage health-related needs will improve Outcome: Progressing   Problem: Self-Care: Goal: Ability to participate in self-care as condition permits will improve Outcome: Progressing Goal: Verbalization of feelings and concerns over difficulty with self-care will improve Outcome: Progressing Goal: Ability to communicate needs accurately will improve Outcome: Progressing   Problem: Nutrition: Goal: Risk of aspiration will decrease 03/18/2019 0129 by Ronalee RedGuy, Nashalie Sallis C, RN Outcome: Progressing 03/18/2019 0129 by Ronalee RedGuy, Charlesia Canaday C, RN Outcome: Progressing Goal: Dietary intake will improve 03/18/2019 0129 by Ronalee RedGuy, Yaneth Fairbairn C, RN Outcome: Progressing 03/18/2019 0129 by Ronalee RedGuy, Quantasia Stegner C, RN Outcome: Progressing   Problem: Ischemic Stroke/TIA Tissue Perfusion: Goal: Complications of ischemic stroke/TIA will be minimized Outcome: Progressing   Problem: Education: Goal: Knowledge of General Education information will improve Description: Including pain rating scale, medication(s)/side effects and non-pharmacologic comfort measures 03/18/2019 0129 by Ronalee RedGuy, Viral Schramm C, RN Outcome: Progressing 03/18/2019 0129 by Ronalee RedGuy, Markie Frith C, RN Outcome: Progressing   Problem: Health Behavior/Discharge Planning: Goal: Ability to manage health-related needs will improve 03/18/2019 0129 by Ronalee RedGuy, Retta Pitcher C, RN Outcome: Progressing 03/18/2019  0129 by Ronalee RedGuy, Meghana Tullo C, RN Outcome: Progressing   Problem: Clinical Measurements: Goal: Ability to maintain clinical measurements within normal limits will improve 03/18/2019 0129 by Ronalee RedGuy, Markeisha Mancias C, RN Outcome: Progressing 03/18/2019 0129 by Ronalee RedGuy, Tashi Andujo C, RN Outcome: Progressing Goal: Will remain free from infection 03/18/2019 0129 by Ronalee RedGuy, Ronita Hargreaves C, RN Outcome: Progressing 03/18/2019 0129 by Ronalee RedGuy, Momo Braun C, RN Outcome: Progressing Goal: Diagnostic test results will improve 03/18/2019 0129 by Ronalee RedGuy, Terrye Dombrosky C, RN Outcome: Progressing 03/18/2019 0129 by Ronalee RedGuy, Matther Labell C, RN Outcome: Progressing Goal: Respiratory complications will improve 03/18/2019 0129 by Ronalee RedGuy, Virginio Isidore C, RN Outcome: Progressing 03/18/2019 0129 by Ronalee RedGuy, Linde Wilensky C, RN Outcome: Progressing Goal: Cardiovascular complication will be avoided 03/18/2019 0129 by Ronalee RedGuy, Kelseigh Diver C, RN Outcome: Progressing 03/18/2019 0129 by Ronalee RedGuy, Takeria Marquina C, RN Outcome: Progressing   Problem: Activity: Goal: Risk for activity intolerance will decrease 03/18/2019 0129 by Ronalee RedGuy, Harvard Zeiss C, RN Outcome: Progressing 03/18/2019 0129 by Ronalee RedGuy, Donis Kotowski C, RN Outcome: Progressing   Problem: Nutrition: Goal: Adequate nutrition will be maintained 03/18/2019 0129 by Ronalee RedGuy, Caytlyn Evers C, RN Outcome: Progressing 03/18/2019 0129 by Ronalee RedGuy, Yanelie Abraha C, RN Outcome: Progressing   Problem: Coping: Goal: Level of anxiety will decrease 03/18/2019 0129 by Ronalee RedGuy, Lorey Pallett C, RN Outcome: Progressing 03/18/2019 0129 by Ronalee RedGuy, Ashmi Blas C, RN Outcome: Progressing   Problem: Elimination: Goal: Will not experience complications related to bowel motility 03/18/2019 0129 by Ronalee RedGuy, Celines Femia C, RN Outcome: Progressing 03/18/2019 0129 by Ronalee RedGuy, Marajade Lei C, RN Outcome: Progressing Goal: Will not experience complications related to urinary retention 03/18/2019 0129 by Ronalee RedGuy, Domonic Hiscox C, RN Outcome: Progressing 03/18/2019 0129 by Ronalee RedGuy, Grainger Mccarley C, RN Outcome: Progressing   Problem: Pain Managment: Goal:  General experience of comfort will improve 03/18/2019 0129 by Ronalee RedGuy, Tye Vigo C, RN Outcome: Progressing 03/18/2019 0129 by Ronalee RedGuy, Macalister Arnaud C, RN Outcome: Progressing   Problem: Safety: Goal: Ability to remain free from injury will improve 03/18/2019 0129 by Ronalee RedGuy, Ezequiel Macauley C, RN Outcome: Progressing 03/18/2019 0129 by Ronalee RedGuy, Jilberto Vanderwall C, RN Outcome: Progressing  Problem: Skin Integrity: Goal: Risk for impaired skin integrity will decrease 03/18/2019 0129 by Lennox Grumbles, RN Outcome: Progressing 03/18/2019 0129 by Lennox Grumbles, RN Outcome: Progressing   Ival Bible, BSN, RN

## 2019-03-18 NOTE — NC FL2 (Signed)
[place] MEDICAID FL2 LEVEL OF CARE SCREENING TOOL     IDENTIFICATION  Patient Name: Miguel [name] Birthdate: Nov 19, 1950 Sex: male Admission Date (Current Location): 03/07/2019  [facility] [place]   Facility and Address:  [facility] [place], [address], [place], [place] [address]      Provider Number: [id number]  Attending Physician Name and Address:  Anne Shutteraines, Alexander N, MD  Relative Name and Phone Number:       Current Level of Care: Hospital Recommended Level of Care: Skilled Nursing Facility Prior Approval Number:    Date Approved/Denied:   PASRR Number: [id number]  Discharge Plan: SNF    Current Diagnoses: Patient Active Problem List   Diagnosis Date Noted  . Protein-calorie malnutrition, severe 03/08/2019  . Dysphagia 03/07/2019  . Dehydration 03/07/2019  . Cerebrovascular accident (CVA) due to thrombosis of right middle cerebral artery (HCC)   . Sinus arrhythmia   . Cerebral embolism with cerebral infarction 12/16/2018  . Left-sided weakness 12/15/2018  . Atrial fibrillation (HCC) 12/15/2018  . Seizure disorder (HCC) 10/18/2014  . Acute renal failure (ARF) (HCC) 09/07/2014  . Acute encephalopathy 09/07/2014  . Homelessness 09/07/2014  . Dilantin level too low 09/07/2014  . 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 Alcohol abuse 02/25/2013  . Dilantin toxicity 02/25/2013  . History of smoking 30 or more pack years 02/25/2013  . Noncompliance 02/25/2013  . Seizures (HCC)   . Hypertension     Orientation RESPIRATION BLADDER Height & Weight     Self(pt is mute and follows commands appropriatly)  Normal Incontinent Weight: 66.7 kg Height:  6' (182.9 cm)  BEHAVIORAL SYMPTOMS/MOOD NEUROLOGICAL BOWEL NUTRITION STATUS    Convulsions/Seizures Continent Feeding tube(Osmolite 1.5cal--474 ml per tube 3 times a day)  AMBULATORY STATUS COMMUNICATION OF NEEDS Skin   Extensive Assist Non-Verbally(nods and shakes his  head no) Surgical wounds(new PEG placement with gauze at the site)                       Personal Care Assistance Level of Assistance  Bathing, Feeding, Dressing Bathing Assistance: Limited assistance Feeding assistance: Maximum assistance Dressing Assistance: Limited assistance     Functional Limitations Info  Sight, Hearing, Speech Sight Info: Adequate Hearing Info: Adequate Speech Info: Impaired(pt is mute)    SPECIAL CARE FACTORS FREQUENCY  PT (By licensed PT), OT (By licensed OT), Speech therapy     PT Frequency: 5x/wk OT Frequency: 5x/wk     Speech Therapy Frequency: 5x/wk      Contractures Contractures Info: Not present    Additional Factors Info  Code Status, Allergies, Suctioning Needs Code Status Info: Full Allergies Info: NKA       Suctioning Needs: oral suctioning per Larena SoxYankeur that patient is able to do independently   Current Medications (03/18/2019):  This is the current hospital active medication list Current Facility-Administered Medications  Medication Dose Route Frequency Provider Last Rate Last Dose  . acetaminophen (TYLENOL) suppository 650 mg  650 mg Rectal Q6H PRN Santos-Sanchez, Chelsea PrimusIdalys, MD      . aspirin suppository 150 mg  150 mg Rectal Daily Santos-Sanchez, Idalys, MD   150 mg at 03/18/19 1032  . chlorhexidine (PERIDEX) 0.12 % solution 15 mL  15 mL Mouth Rinse BID Debe CoderMullen, Emily B, MD   15 mL at 03/18/19 1032  . dextrose 5 % and 0.2 % NaCl infusion   Intravenous Continuous Thom ChimesJones, Ellen, MD 75 mL/hr at 03/18/19 1202    .  enoxaparin (LOVENOX) injection 40 mg  40 mg Subcutaneous Q24H Ladona Horns, MD   40 mg at 03/18/19 1032  . feeding supplement (OSMOLITE 1.5 CAL) liquid 474 mL  474 mL Per Tube TID Oda Kilts, MD      . feeding supplement (PRO-STAT SUGAR FREE 64) liquid 30 mL  30 mL Per Tube Q1200 Oda Kilts, MD      . folic acid injection 1 mg  1 mg Intravenous Daily Welford Roche, MD   1 mg at 03/17/19 0846  .  [START ON 03/19/2019] free water 200 mL  200 mL Per Tube QID Oda Kilts, MD      . hydrALAZINE (APRESOLINE) injection 2 mg  2 mg Intravenous Q6H PRN Ladona Horns, MD   2 mg at 03/10/19 1644  . ipratropium-albuterol (DUONEB) 0.5-2.5 (3) MG/3ML nebulizer solution 3 mL  3 mL Nebulization BID Gilles Chiquito B, MD   3 mL at 03/18/19 0837  . ipratropium-albuterol (DUONEB) 0.5-2.5 (3) MG/3ML nebulizer solution 3 mL  3 mL Nebulization Q6H PRN Gilles Chiquito B, MD      . levETIRAcetam (KEPPRA) IVPB 500 mg/100 mL premix  500 mg Intravenous Q12H Santos-Sanchez, Merlene Morse, MD 400 mL/hr at 03/18/19 1033 500 mg at 03/18/19 1033  . MEDLINE mouth rinse  15 mL Mouth Rinse q12n4p Gilles Chiquito B, MD   15 mL at 03/17/19 1740  . metoCLOPramide (REGLAN) injection 10 mg  10 mg Intravenous Q6H Ladona Horns, MD   10 mg at 03/18/19 0535  . neomycin-bacitracin-polymyxin (NEOSPORIN) ointment 1 application  1 application Topical Daily Corrie Mckusick, DO   1 application at 40/81/44 1034  . sodium chloride flush (NS) 0.9 % injection 3 mL  3 mL Intravenous Q12H Welford Roche, MD   3 mL at 03/17/19 2121  . sodium phosphate 20 mmol in dextrose 5 % 250 mL infusion  20 mmol Intravenous Once Ladona Horns, MD 43 mL/hr at 03/18/19 1034 20 mmol at 03/18/19 1034  . thiamine (B-1) injection 100 mg  100 mg Intravenous Daily Welford Roche, MD   100 mg at 03/18/19 1032     Discharge Medications: Please see discharge summary for a list of discharge medications.  Relevant Imaging Results:  Relevant Lab Results:   Additional Information SS#: [id number] /// after rehab will return to live with his daughter  Pollie Friar, RN

## 2019-03-18 NOTE — TOC Progression Note (Signed)
Transition of Care [facility]) - Progression Note    Patient Details  Name: Miguel [name] MRN: [id number] Date of Birth: 01-Oct-1950  [facility] CM/SW Contact  Pollie Friar, RN Phone Number: 03/18/2019, 3:35 PM  Clinical Narrative:    CM called and spoke to daughter: [name] about coming to the hospital to learn about giving her dad tube feedings at home. Currently she doesn't feel she is able to care for him at home until he is stronger and the PEG is more mature. She is asking that he be faxed out to rehabs in the [place] area. CM completed the [facility] and faxed him out. Will provide bed offers to the daughter tomorrow. TOC following.   Expected Discharge Plan: [place] Barriers to Discharge: Continued Medical Work up  Expected Discharge Plan and Services Expected Discharge Plan: [place]   Discharge Planning Services: CM Consult Post Acute Care Choice: [place]: [place] ([facility])         Social Determinants of Health (SDOH) Interventions    Readmission Risk Interventions No flowsheet data found.

## 2019-03-19 ENCOUNTER — Encounter (HOSPITAL_COMMUNITY): Payer: Self-pay | Admitting: *Deleted

## 2019-03-19 ENCOUNTER — Inpatient Hospital Stay (HOSPITAL_COMMUNITY): Payer: Medicare Other

## 2019-03-19 LAB — GLUCOSE, CAPILLARY
Glucose-Capillary: 107 mg/dL — ABNORMAL HIGH (ref 70–99)
Glucose-Capillary: 111 mg/dL — ABNORMAL HIGH (ref 70–99)
Glucose-Capillary: 115 mg/dL — ABNORMAL HIGH (ref 70–99)
Glucose-Capillary: 132 mg/dL — ABNORMAL HIGH (ref 70–99)
Glucose-Capillary: 172 mg/dL — ABNORMAL HIGH (ref 70–99)
Glucose-Capillary: 180 mg/dL — ABNORMAL HIGH (ref 70–99)

## 2019-03-19 LAB — RENAL FUNCTION PANEL
Albumin: 2.9 g/dL — ABNORMAL LOW (ref 3.5–5.0)
Anion gap: 11 (ref 5–15)
BUN: 8 mg/dL (ref 8–23)
CO2: 22 mmol/L (ref 22–32)
Calcium: 8.9 mg/dL (ref 8.9–10.3)
Chloride: 105 mmol/L (ref 98–111)
Creatinine, Ser: 0.93 mg/dL (ref 0.61–1.24)
GFR calc Af Amer: >60 mL/min (ref >60)
GFR calc non Af Amer: >60 mL/min (ref >60)
Glucose, Bld: 116 mg/dL — ABNORMAL HIGH (ref 70–99)
Phosphorus: 2.1 mg/dL — ABNORMAL LOW (ref 2.5–4.6)
Potassium: 4.2 mmol/L (ref 3.5–5.1)
Sodium: 138 mmol/L (ref 135–145)

## 2019-03-19 LAB — MAGNESIUM: Magnesium: 1.6 mg/dL — ABNORMAL LOW (ref 1.7–2.4)

## 2019-03-19 LAB — URINALYSIS, ROUTINE W REFLEX MICROSCOPIC
Bilirubin Urine: NEGATIVE
Glucose, UA: NEGATIVE mg/dL
Hgb urine dipstick: NEGATIVE
Ketones, ur: NEGATIVE mg/dL
Leukocytes,Ua: NEGATIVE
Nitrite: NEGATIVE
Protein, ur: NEGATIVE mg/dL
Specific Gravity, Urine: 1.01 (ref 1.005–1.030)
pH: 7 (ref 5.0–8.0)

## 2019-03-19 MED ORDER — SODIUM PHOSPHATES 45 MMOLE/15ML IV SOLN
30.0000 mmol | Freq: Once | INTRAVENOUS | Status: AC
  Administered 2019-03-19: 22:00:00 30 mmol via INTRAVENOUS
  Filled 2019-03-19: qty 10

## 2019-03-19 MED ORDER — ACETAMINOPHEN 325 MG PO TABS
650.0000 mg | ORAL_TABLET | Freq: Four times a day (QID) | ORAL | Status: DC | PRN
  Administered 2019-03-19: 650 mg
  Filled 2019-03-19: qty 2

## 2019-03-19 MED ORDER — MAGNESIUM SULFATE 2 GM/50ML IV SOLN
2.0000 g | Freq: Once | INTRAVENOUS | Status: AC
  Administered 2019-03-19: 2 g via INTRAVENOUS
  Filled 2019-03-19: qty 50

## 2019-03-19 MED ORDER — SODIUM PHOSPHATES 45 MMOLE/15ML IV SOLN
30.0000 mmol | Freq: Once | INTRAVENOUS | Status: AC
  Administered 2019-03-19: 12:00:00 30 mmol via INTRAVENOUS
  Filled 2019-03-19: qty 10

## 2019-03-19 NOTE — Progress Notes (Signed)
  Speech Language Pathology Treatment: Dysphagia  Patient Details Name: Miguel [name] MRN: [id number] DOB: 1951-06-29 Today's Date: 03/19/2019 Time: 9735-3299 SLP Time Calculation (min) (ACUTE ONLY): 12 min  Assessment / Plan / Recommendation Clinical Impression  Pt seen for dysphagia tx. Elevated HOB, pt participating in own oral care.  Increased difficulty managing secretions notable this am, requiring oral suctioning to remove excessive frothy secretions from oral cavity.  Wet cough at baseline.  Pt required min verbal cues to cough, expectorate and/or self-section; mod cues for effortful swallow.  He consumed teaspoons of nectar thick liquid (safest quantity and consistency per last week's MBS) with audible swallow, several sub swallows necessary for likely clearance.  Pt continued to require intermittent oral suctioning to remove material that he could not pass into pharynx.  Continues with severe oral>pharyngeal dysphagia.  Added language to his communication board so that he can point to/request thickened liquids PRN. Primary nutrition via PEG.  SLP will continue to follow.   HPI HPI: 68 year-old male presents the ED for 2-day history of acute on chronic dysphagia.  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  Brain imaging shows Large areas of old infarct and encephalomalacia noted involving the frontal lobes bilaterally as well as an area of old infarct involving the left temporal lobe. Chronic right paramedian pontine infarction.   MBS 8/13 with severe oral dysphagia, milder pharyngeal component. Family is considering comfort approach vs PEG.       SLP Plan  Continue with current plan of care       Recommendations  Diet recommendations: Nectar-thick liquid Liquids provided via: Teaspoon Medication Administration: Via alternative means Compensations: Small sips/bites Postural  Changes and/or Swallow Maneuvers: Seated upright 90 degrees                Oral Care Recommendations: Oral care QID Follow up Recommendations: Skilled Nursing facility SLP Visit Diagnosis: Dysphagia, oropharyngeal phase (R13.12) Plan: Continue with current plan of care       GO                Juan Quam Laurice 03/19/2019, 11:01 AM  Estill Bamberg L. Tivis Ringer, [place] Office number [phone] Pager [phone]

## 2019-03-19 NOTE — Progress Notes (Signed)
Subjective: Pt seen at the bedside this morning. Denies abdominal pain, nausea, or shortness of breath. Nods that he is feeling well this morning.   Objective:  Vital signs in last 24 hours: Vitals:   03/18/19 2001 03/19/19 0015 03/19/19 0410 03/19/19 0500  BP: (!) 142/71 (!) 160/77 (!) 144/65   Pulse: 83 91 (!) 108   Resp: 17 17 17    Temp: 99.4 F (37.4 C)  97.6 F (36.4 C)   TempSrc: Oral  Oral   SpO2: 95%  93%   Weight:    66.8 kg  Height:        Physical Exam Vitals signs and nursing note reviewed.  Constitutional:      General: He is not in acute distress.    Appearance: He is cachectic. He is ill-appearing (chronically).  Cardiovascular:     Rate and Rhythm: Normal rate and regular rhythm.     Heart sounds: Normal heart sounds. No murmur. No friction rub. No gallop.   Pulmonary:     Effort: Pulmonary effort is normal. No respiratory distress.     Breath sounds: Normal breath sounds. Transmitted upper airway sounds present.  Abdominal:     General: Abdomen is flat. Bowel sounds are normal.     Palpations: Abdomen is soft.     Comments: G tube dressing clean, dry, and intact.  Musculoskeletal:     Right lower leg: No edema.     Left lower leg: No edema.  Neurological:     Mental Status: He is alert.  Psychiatric:     Comments: Affect more animated than prior days.     Assessment/Plan: Mr. Senger is a 68 year old male who presented with a 2-day history of dysphasia after possible aspiration event at home. CVA in May of 2020 with residual deficits of dysphagia, R facial droop, and L hemiparesis. Modified barium swallow on 8/13 showed severe oral deficits making it difficult for pt to meet nutritional needs by PO alone. G tube placed on 8/17 and feedings began 8/19.   Acute on chronic dysphagia - PEG tube being used for long-term nutrition Dietitian consulted for tube feeding management  - Monitor magnesium, potassium, and phosphorus daily for at least 3 days, MD  to replete as needed, as pt is at risk for refeeding syndrome given severe malnutrition and little to no nutrition since admission. - At 1800 on 8/20, start bolus feed using Osmolite 1.5 formula at volume of 120 ml (half carton/ARC) and advanced by 120 ml every feeding to goal volume of 474 ml (2 cartons/ARCs) given TID.  - Provide 30 ml Prostat (or equivalent) once daily per tube.  - Free water flushes of 200 ml given QID in between bolus feeds.  - Tube feeding regimen to provide 2233 kcal (100% of needs), 104 grams of protein, and 1881 ml water.   - K 4.2, Phos 2.1, and Mg 1.6  - replete Phos with 15mEq IV x2  - replete Mg with 2g IV  - aspiration precautions - continue PT/OT and speech   Hypertension  - hold home amlodipine 5mg   - PRN hydralazine 2mg  available for SBP >170  Seizure disorder -continue keppra 500mg  BID IV (while pt NPO) -seizure precautions  History of CVA - stroke consistent with small vessel disease -continue aspirin 150mg  suppository  Hyperlipidemia -hold home lipitor 80mg   Diet - tube feeds as above Fluids - free water flushes 263mL QID DVT ppx - enoxaparin 40mg  subQ daily CODE STATUS - FULL CODE  Dispo: Discharge pending necessary monitoring for refeeding syndrome and SNF placement  Live with daughter, Archie Pattenonya - [phone].   Miguel [name], Miguel Briles, MD 03/19/2019, 6:52 AM Pager: [phone]

## 2019-03-19 NOTE — Care Management Important Message (Signed)
Important Message  Patient Details  Name: Miguel [name] MRN: [id number] Date of Birth: 05/31/1951   Medicare Important Message Given:  Yes     Orbie Pyo 03/19/2019, 4:12 PM

## 2019-03-19 NOTE — Telephone Encounter (Signed)
Patient will be in hospice once released from the hospital and they are wanting to know if you will be the attending physician once he is in hospice.

## 2019-03-19 NOTE — Progress Notes (Signed)
  Date: 03/19/2019  Patient name: Miguel [name]  Medical record number: [id number]  Date of birth: 08-22-1950   I have seen and evaluated this patient and I have discussed the plan of care with the house staff. Please see their note for complete details. I concur with their findings with the following additions/corrections:   Tolerating tube feeds quite well, working on consolidating to bolus feeds for convenience.  Unfortunately, has developed mild refeeding syndrome with hypophosphatemia, but potassium remains normal.  We will aggressively replete his phosphate but continue feeding as his electrolyte disturbances only mild at this time with no symptoms.  Unfortunately, new fever as well today to 100.9.  Unclear etiology, but he has difficulty reporting symptoms.  He has been a little more tachypneic and is obviously at high risk of aspiration.  We will check at CXR and urinalysis.  Lenice Pressman, M.D., Ph.D. 03/19/2019, 5:00 PM

## 2019-03-19 NOTE — Progress Notes (Signed)
Physical Therapy Treatment Patient Details Name: Miguel [name] MRN: [id number] DOB: 05/27/1951 Today's Date: 03/19/2019    History of Present Illness 68 year old male presents the ED for 2-day history of dysphasia.  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    PT Comments    Patient required more assist this session from previous. He was able to progress OOB to recliner chair with mod A +2. At this time pt is unsafe to attempt ambulation. Pt has shown a decline in mobility for the last two sessions. If this continues, we may need to update d/c recommendations.  Pt's daughter present for session and became tearful seeing her father's current level for function. Doubtful that pt's daughter will be able to provide the amount of physical assist currently required. Will continue to follow acutely.   Follow Up Recommendations  Home health PT;Supervision/Assistance - 24 hour     Equipment Recommendations  None recommended by PT    Recommendations for Other Services       Precautions / Restrictions Precautions Precautions: Fall;Other (comment) Precaution Comments: non-verbal at baseline due to previous CVA Restrictions Weight Bearing Restrictions: No    Mobility  Bed Mobility Overal bed mobility: Needs Assistance Bed Mobility: Supine to Sit     Supine to sit: HOB elevated;+2 for physical assistance;+2 for safety/equipment;Min assist     General bed mobility comments: Cues for sequencing/reaching for bed rail; Assist to elevate trunk into sitting.   Transfers Overall transfer level: Needs assistance Equipment used: Rolling walker (2 wheeled) Transfers: Sit to/from [facility] Sit to Stand: Mod assist;+2 physical assistance;+2 safety/equipment;From elevated surface Stand pivot transfers: Mod assist       General transfer comment: pt unable to stand on  first attempt secondary to LLE buckling. He was able to stand on second attempt with PTA blocking L LE. Mod A to power up and steady during transfer.  Ambulation/Gait             General Gait Details: unsafe to attempt today.   Stairs             Wheelchair Mobility    Modified Rankin (Stroke Patients Only)       Balance Overall balance assessment: Needs assistance Sitting-balance support: No upper extremity supported;Feet supported Sitting balance-Leahy Scale: Fair     Standing balance support: Bilateral upper extremity supported;During functional activity Standing balance-Leahy Scale: Poor Standing balance comment: reliant on external support/RW                            Cognition Arousal/Alertness: Awake/alert Behavior During Therapy: Flat affect Overall Cognitive Status: Difficult to assess                                 General Comments: nonverbal at baseline; pt follows single step commands for the majority, nodding head yes/no intermittently given increased time/cues      Exercises      General Comments        Pertinent Vitals/Pain Pain Assessment: No/denies pain Faces Pain Scale: No hurt    Home Living                      Prior Function            PT Goals (current goals can now  be found in the care plan section) Acute Rehab PT Goals Patient Stated Goal: unable to verbalize PT Goal Formulation: With patient/family Time For Goal Achievement: 03/22/19 Potential to Achieve Goals: Good Progress towards PT goals: Not progressing toward goals - comment    Frequency    Min 3X/week      PT Plan Current plan remains appropriate    Co-evaluation              AM-PAC PT "6 Clicks" Mobility   Outcome Measure  Help needed turning from your back to your side while in a flat bed without using bedrails?: A Little Help needed moving from lying on your back to sitting on the side of a flat bed  without using bedrails?: A Lot Help needed moving to and from a bed to a chair (including a wheelchair)?: A Lot Help needed standing up from a chair using your arms (e.g., wheelchair or bedside chair)?: A Lot Help needed to walk in hospital room?: Total Help needed climbing 3-5 steps with a railing? : Total 6 Click Score: 11    End of Session Equipment Utilized During Treatment: Gait belt Activity Tolerance: Other (comment)(limited by weakness) Patient left: with call bell/phone within reach;in chair;with chair alarm set Nurse Communication: Mobility status PT Visit Diagnosis: Other abnormalities of gait and mobility (R26.89);Muscle weakness (generalized) (M62.81)     Time: [id number] PT Time Calculation (min) (ACUTE ONLY): 28 min  Charges:  $Therapeutic Activity: 23-37 mins                    Kallie LocksHannah Gen Clagg, [place] Pager [phone] Acute Rehab   Sheral ApleyHannah E Terica Yogi 03/19/2019, 3:26 PM

## 2019-03-19 NOTE — TOC Progression Note (Signed)
Transition of Care [facility]) - Progression Note    Patient Details  Name: Miguel [name] MRN: [id number] Date of Birth: 1951/05/04  Transition of Care [facility]) CM/SW Contact  Pollie Friar, RN Phone Number: 03/19/2019, 1:24 PM  Clinical Narrative:    CM provided the daughter choice for SNF rehab. She selected [facility] They will not have a bed for several days. CM updated the daughter and she is agreeable to [place] if he is ready for d/c before [place] would have a bed.  TOC following.   Expected Discharge Plan: [place] Barriers to Discharge: Continued Medical Work up  Expected Discharge Plan and Services Expected Discharge Plan: [place]   Discharge Planning Services: CM Consult Post Acute Care Choice: [place]: [place] ([facility])         Social Determinants of Health (SDOH) Interventions    Readmission Risk Interventions No flowsheet data found.

## 2019-03-20 LAB — GLUCOSE, CAPILLARY
Glucose-Capillary: 104 mg/dL — ABNORMAL HIGH (ref 70–99)
Glucose-Capillary: 174 mg/dL — ABNORMAL HIGH (ref 70–99)
Glucose-Capillary: 185 mg/dL — ABNORMAL HIGH (ref 70–99)
Glucose-Capillary: 89 mg/dL (ref 70–99)
Glucose-Capillary: 94 mg/dL (ref 70–99)
Glucose-Capillary: 98 mg/dL (ref 70–99)

## 2019-03-20 LAB — RENAL FUNCTION PANEL
Albumin: 2.4 g/dL — ABNORMAL LOW (ref 3.5–5.0)
Anion gap: 11 (ref 5–15)
BUN: 11 mg/dL (ref 8–23)
CO2: 22 mmol/L (ref 22–32)
Calcium: 8.2 mg/dL — ABNORMAL LOW (ref 8.9–10.3)
Chloride: 104 mmol/L (ref 98–111)
Creatinine, Ser: 0.77 mg/dL (ref 0.61–1.24)
GFR calc Af Amer: >60 mL/min (ref >60)
GFR calc non Af Amer: >60 mL/min (ref >60)
Glucose, Bld: 111 mg/dL — ABNORMAL HIGH (ref 70–99)
Phosphorus: 5.5 mg/dL — ABNORMAL HIGH (ref 2.5–4.6)
Potassium: 4.1 mmol/L (ref 3.5–5.1)
Sodium: 137 mmol/L (ref 135–145)

## 2019-03-20 LAB — MAGNESIUM: Magnesium: 1.9 mg/dL (ref 1.7–2.4)

## 2019-03-20 MED ORDER — POLYETHYLENE GLYCOL 3350 17 G PO PACK
17.0000 g | PACK | Freq: Every day | ORAL | Status: DC
  Administered 2019-03-20 – 2019-03-22 (×3): 17 g
  Filled 2019-03-20 (×3): qty 1

## 2019-03-20 MED ORDER — ASPIRIN 300 MG RE SUPP
150.0000 mg | Freq: Every day | RECTAL | Status: DC
  Administered 2019-03-21: 10:00:00 150 mg via RECTAL
  Filled 2019-03-20: qty 1

## 2019-03-20 MED ORDER — LEVETIRACETAM IN NACL 500 MG/100ML IV SOLN
500.0000 mg | Freq: Two times a day (BID) | INTRAVENOUS | Status: DC
  Administered 2019-03-21 – 2019-03-22 (×2): 500 mg via INTRAVENOUS
  Filled 2019-03-20 (×4): qty 100

## 2019-03-20 MED ORDER — LEVETIRACETAM 500 MG PO TABS
500.0000 mg | ORAL_TABLET | Freq: Two times a day (BID) | ORAL | Status: DC
  Administered 2019-03-20 – 2019-03-21 (×2): 500 mg via ORAL
  Filled 2019-03-20 (×3): qty 1

## 2019-03-20 MED ORDER — ASPIRIN EC 81 MG PO TBEC
81.0000 mg | DELAYED_RELEASE_TABLET | Freq: Every day | ORAL | Status: DC
  Administered 2019-03-22: 81 mg via ORAL
  Filled 2019-03-20: qty 1

## 2019-03-20 NOTE — Plan of Care (Signed)
  Problem: Pain Managment: Goal: General experience of comfort will improve Outcome: Progressing   

## 2019-03-20 NOTE — Progress Notes (Signed)
   Subjective: Pt denies any pain or shortness of breath. He is not nauseous, has not vomited has no complaints.  Afebrile since yesterday morning  Objective:  Vital signs in last 24 hours: Vitals:   03/20/19 0348 03/20/19 0500 03/20/19 0754 03/20/19 0816  BP: 123/75  126/77   Pulse: 87  88   Resp: 18  20   Temp: 98.7 F (37.1 C)  98.6 F (37 C)   TempSrc: Oral  Axillary   SpO2: 97%  95% 96%  Weight:  69 kg    Height:        Cardiac: JVD flat, normal rate and rhythm, clear s1 and s2, no murmurs, rubs or gallops, no LE edema Pulmonary: not making an effort today when asked to inhale deeply, breathing more comfortably today, no acute distress Abdominal: non distended abdomen, soft and nontender Psych: Alert, flat affect   Assessment/Plan: Miguel [name] is a 68 year old male who presented with a 2-day history of dysphasia after possible aspiration event at home. CVA in May of 2020 with residual deficits of dysphagia, R facial droop, and L hemiparesis. Modified barium swallow on 8/13 showed severe oral deficits making it difficult for pt to meet nutritional needs by PO alone. G tube placed on 8/17 and feedings began 8/19.   Acute on chronic dysphagia - PEG tube being used for long-term nutrition.  Dietitian consulted for tube feeding management.  Seems to be tolerating well and coming out of the window of time where we are concerned for refeeding syndrome. 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   Katherine Roan, MD 03/20/2019, 11:14 AM

## 2019-03-21 LAB — GLUCOSE, CAPILLARY
Glucose-Capillary: 111 mg/dL — ABNORMAL HIGH (ref 70–99)
Glucose-Capillary: 178 mg/dL — ABNORMAL HIGH (ref 70–99)
Glucose-Capillary: 164 mg/dL — ABNORMAL HIGH (ref 70–99)
Glucose-Capillary: 109 mg/dL — ABNORMAL HIGH (ref 70–99)

## 2019-03-21 LAB — RENAL FUNCTION PANEL
Albumin: 2.5 g/dL — ABNORMAL LOW (ref 3.5–5.0)
Anion gap: 10 (ref 5–15)
BUN: 13 mg/dL (ref 8–23)
CO2: 22 mmol/L (ref 22–32)
Calcium: 8.9 mg/dL (ref 8.9–10.3)
Chloride: 106 mmol/L (ref 98–111)
Creatinine, Ser: 0.72 mg/dL (ref 0.61–1.24)
GFR calc Af Amer: >60 mL/min (ref >60)
GFR calc non Af Amer: >60 mL/min (ref >60)
Glucose, Bld: 107 mg/dL — ABNORMAL HIGH (ref 70–99)
Phosphorus: 3.4 mg/dL (ref 2.5–4.6)
Potassium: 5 mmol/L (ref 3.5–5.1)
Sodium: 138 mmol/L (ref 135–145)

## 2019-03-21 LAB — MAGNESIUM: Magnesium: 2 mg/dL (ref 1.7–2.4)

## 2019-03-21 MED ORDER — AMLODIPINE BESYLATE 5 MG PO TABS
5.0000 mg | ORAL_TABLET | Freq: Every day | ORAL | Status: DC
  Administered 2019-03-22: 5 mg
  Filled 2019-03-21: qty 1

## 2019-03-21 NOTE — Progress Notes (Signed)
  Date: 03/21/2019  Patient name: Miguel [name]  Medical record number: [id number]  Date of birth: [birth year]   I have seen and evaluated this patient and I have discussed the plan of care with the house staff. Please see their note for complete details. I concur with their findings with the following additions/corrections:   No further fevers. K and Phos normal, mild refeeding syndrome appears to have resolved, will check again tomorrow and if normal, cleared for discharge to SNF when bed available.  Lenice Pressman, M.D., Ph.D. 03/21/2019, 2:19 PM

## 2019-03-21 NOTE — Progress Notes (Signed)
   Subjective: Pt denies any pain or shortness of breath. He is not nauseous.  He shakes his head no that he does not need anything.  He acknowledges that he did choke on the oral spoon feeds yesterday.    Objective:  Vital signs in last 24 hours: Vitals:   03/21/19 0423 03/21/19 0500 03/21/19 0759 03/21/19 1147  BP: 132/86  (!) 145/83 (!) 149/77  Pulse: 85  90 94  Resp: 18  20 20   Temp: 65.0 F (36.8 C)  98.8 F (37.1 C) 98.1 F (36.7 C)  TempSrc: Oral  Oral Oral  SpO2: 95%  96% 97%  Weight:  67.9 kg    Height:        Cardiac: JVD flat, normal rate and rhythm, clear s1 and s2, no murmurs, rubs or gallops, no LE edema Pulmonary: scattered rhonchi, worse compared with yesterday, normal RR, not in distress Abdominal: non distended abdomen, soft and nontender Psych: Alert, flat affect   Assessment/Plan: Mr. Heinle is a 68 year old male who presented with a 2-day history of dysphasia after possible aspiration event at home. CVA in May of 2020 with residual deficits of dysphagia, R facial droop, and L hemiparesis. Modified barium swallow on 8/13 showed severe oral deficits making it difficult for pt to meet nutritional needs by PO alone. G tube placed on 8/17 and feedings began 8/19.   Acute on chronic dysphagia - PEG tube being used for long-term nutrition.  Dietitian consulted for tube feeding management.  Seems to be tolerating well and coming out of the window of time where we are concerned for refeeding syndrome. 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   Katherine Roan, MD 03/21/2019, 2:10 PM

## 2019-03-22 DIAGNOSIS — Z6820 Body mass index (BMI) 20.0-20.9, adult: Secondary | ICD-10-CM

## 2019-03-22 LAB — CBC
HCT: 39.7 % (ref 39.0–52.0)
Hemoglobin: 13.1 g/dL (ref 13.0–17.0)
MCH: 30.1 pg (ref 26.0–34.0)
MCHC: 33 g/dL (ref 30.0–36.0)
MCV: 91.3 fL (ref 80.0–100.0)
Platelets: 259 10*3/uL (ref 150–400)
RBC: 4.35 MIL/uL (ref 4.22–5.81)
RDW: 14.5 % (ref 11.5–15.5)
WBC: 8.2 10*3/uL (ref 4.0–10.5)
nRBC: 0 % (ref 0.0–0.2)

## 2019-03-22 LAB — RENAL FUNCTION PANEL
Albumin: 2.5 g/dL — ABNORMAL LOW (ref 3.5–5.0)
Anion gap: 9 (ref 5–15)
BUN: 14 mg/dL (ref 8–23)
CO2: 24 mmol/L (ref 22–32)
Calcium: 9.1 mg/dL (ref 8.9–10.3)
Chloride: 105 mmol/L (ref 98–111)
Creatinine, Ser: 0.92 mg/dL (ref 0.61–1.24)
GFR calc Af Amer: >60 mL/min (ref >60)
GFR calc non Af Amer: >60 mL/min (ref >60)
Glucose, Bld: 121 mg/dL — ABNORMAL HIGH (ref 70–99)
Phosphorus: 3.7 mg/dL (ref 2.5–4.6)
Potassium: 4.5 mmol/L (ref 3.5–5.1)
Sodium: 138 mmol/L (ref 135–145)

## 2019-03-22 LAB — GLUCOSE, CAPILLARY
Glucose-Capillary: 113 mg/dL — ABNORMAL HIGH (ref 70–99)
Glucose-Capillary: 109 mg/dL — ABNORMAL HIGH (ref 70–99)

## 2019-03-22 LAB — MAGNESIUM: Magnesium: 1.9 mg/dL (ref 1.7–2.4)

## 2019-03-22 LAB — SARS CORONAVIRUS 2 BY RT PCR (HOSPITAL ORDER, PERFORMED IN ~~LOC~~ HOSPITAL LAB): SARS Coronavirus 2: NEGATIVE

## 2019-03-22 MED ORDER — CHLORHEXIDINE GLUCONATE 0.12 % MT SOLN: 15.0000 mL | Freq: Two times a day (BID) | OROMUCOSAL | 0 refills | Status: AC

## 2019-03-22 MED ORDER — FOLIC ACID 1 MG PO TABS: 1.0000 mg | ORAL_TABLET | Freq: Every day | ORAL | 0 refills | Status: AC

## 2019-03-22 MED ORDER — IPRATROPIUM-ALBUTEROL 0.5-2.5 (3) MG/3ML IN SOLN: 3.0000 mL | Freq: Four times a day (QID) | RESPIRATORY_TRACT | Status: AC | PRN

## 2019-03-22 MED ORDER — OSMOLITE 1.5 CAL PO LIQD: 474.0000 mL | Freq: Three times a day (TID) | ORAL | 0 refills | Status: AC

## 2019-03-22 MED ORDER — AMLODIPINE BESYLATE 5 MG PO TABS: 5.0000 mg | ORAL_TABLET | Freq: Every day | ORAL | 1 refills | Status: AC

## 2019-03-22 MED ORDER — POLYETHYLENE GLYCOL 3350 17 GM/SCOOP PO POWD: 17.0000 g | Freq: Every day | ORAL | 1 refills | Status: AC

## 2019-03-22 MED ORDER — PRO-STAT SUGAR FREE PO LIQD: 30.0000 mL | Freq: Every day | ORAL | 0 refills | Status: AC

## 2019-03-22 MED ORDER — FREE WATER: 200.0000 mL | Freq: Four times a day (QID) | 0 refills | Status: AC

## 2019-03-22 MED ORDER — ASPIRIN 81 MG PO CHEW: 81.0000 mg | CHEWABLE_TABLET | Freq: Every day | ORAL | 0 refills | Status: AC

## 2019-03-22 MED ORDER — ASPIRIN 81 MG PO CHEW: 81.0000 mg | CHEWABLE_TABLET | Freq: Every day | ORAL | Status: DC

## 2019-03-22 MED ORDER — ATORVASTATIN CALCIUM 80 MG PO TABS: 80.0000 mg | ORAL_TABLET | Freq: Every day | ORAL | 1 refills | Status: AC

## 2019-03-22 MED ORDER — ORAL CARE MOUTH RINSE: 15.0000 mL | Freq: Two times a day (BID) | OROMUCOSAL | 0 refills | Status: AC

## 2019-03-22 NOTE — Care Management Important Message (Signed)
Important Message  Patient Details  Name: Miguel [name] MRN: [id number] Date of Birth: 1950-08-09   Medicare Important Message Given:  Yes     Orbie Pyo 03/22/2019, 3:14 PM

## 2019-03-22 NOTE — Telephone Encounter (Signed)
I can

## 2019-03-22 NOTE — Progress Notes (Signed)
  Date: 03/22/2019  Patient name: [name]  Medical record number: [id number]  Date of birth: Sep 08, 1950   I have seen and evaluated this patient and I have discussed the plan of care with the house staff. Please see their note for complete details. I concur with their findings with the following additions/corrections:   Refeeding syndrome has resolved, medically stable for discharge to SNF as soon as bed is available.  Lenice Pressman, M.D., Ph.D. 03/22/2019, 11:15 AM

## 2019-03-22 NOTE — Progress Notes (Signed)
   Subjective: Pt seen at the bedside this morning. Denies any questions or concerns. Nods that he is feeling well. Denies shortness of breath, abdominal pain, or nausea.  Objective:  Vital signs in last 24 hours: Vitals:   03/21/19 2036 03/21/19 2336 03/22/19 0354 03/22/19 0500  BP: (!) 156/70 (!) 146/92 135/70   Pulse: 92 71 90   Resp: 18 16 16    Temp: 98.3 F (36.8 C) 97.8 F (36.6 C) 98 F (36.7 C)   TempSrc: Oral Oral Oral   SpO2: 98% 98% 93%   Weight:    67 kg  Height:        Physical Exam Vitals signs and nursing note reviewed.  Constitutional:      General: He is not in acute distress.    Appearance: He is cachectic.  Cardiovascular:     Rate and Rhythm: Normal rate and regular rhythm.     Heart sounds: Normal heart sounds. No murmur. No friction rub. No gallop.   Pulmonary:     Effort: Pulmonary effort is normal. No tachypnea or respiratory distress.     Comments: Scattered rhonchi throughout. Abdominal:     General: Abdomen is flat. Bowel sounds are normal.     Palpations: Abdomen is soft.  Musculoskeletal:     Right lower leg: No edema.     Left lower leg: No edema.  Neurological:     Mental Status: He is alert.     Cranial Nerves: Facial asymmetry (R facial droop) present.      Assessment/Plan: Mr. Frieden is a 68 year old male who presented with a 2-day history of dysphasia after possible aspiration event at home. CVA in May of 2020 with residual deficits of dysphagia, R facial droop, and L hemiparesis. Modified barium swallow on 8/13 showed severe oral deficits making it difficult for pt to meet nutritional needs by PO alone. G tube placed on 8/17 and feedings began 8/19.   Acute on chronic dysphagia - PEG tube being used for long-term nutrition. Dietitian consulted for tube feeding management.  - Out of high risk window for refeeding syndrome  - K 4.5, Phos 3.8, and Mg 1.9 Tolerating full feeds well - Osmolite 1.5 formula with goal volume 474 ml (2  cartons/ARCs) given TID - Provide 30 ml Prostat (or equivalent) once daily per tube.  - Free water flushes of 200 ml given QID in between bolus feeds.  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   Ladona Horns, MD 03/22/2019, 6:24 AM Pager: [phone]

## 2019-03-22 NOTE — TOC Transition Note (Signed)
Transition of Care [facility]) - CM/SW Discharge Note   Patient Details  Name: Miguel [name] MRN: [id number] Date of Birth: 07-Jan-1951  Transition of Care [facility]) CM/SW Contact:  Pollie Friar, RN Phone Number: 03/22/2019, 1:21 PM   Clinical Narrative:    Pt's daughter has decided to have him d/c to [facility]. They have an available bed today.  Pt to transport to the facility via PTAR. Bedside RN and daughter aware of timing for PTAR.  D/c packet is at the desk.  Number for report: [phone] Room number at [facility]: 114   Final next level of care: Skilled Nursing Facility Barriers to Discharge: No Barriers Identified   Patient Goals and CMS Choice   CMS Medicare.gov Compare Post Acute Care list provided to:: Patient Represenative (must comment) Choice offered to / list presented to : Adult Children(daughter)  Discharge Placement              Patient chooses bed at: [facility] Patient to be transferred to facility by: [place] Name of family member notified: [name]--daughter Patient and family notified of of transfer: 03/22/19(1321 pm)  Discharge Plan and Services   Discharge Planning Services: CM Consult Post Acute Care Choice: [place] Agency: [place] ([facility])        Social Determinants of Health (SDOH) Interventions     Readmission Risk Interventions No flowsheet data found.

## 2019-03-23 NOTE — Telephone Encounter (Signed)
Miguel [name] was called and she states that patient decided to go to therapy instead of hospice.

## 2019-03-29 ENCOUNTER — Telehealth: Payer: Self-pay | Admitting: Neurology

## 2019-03-29 NOTE — Telephone Encounter (Signed)
Pt's step daughter was called back and told that because pt is in rehab(due to him being sent to hospital for excessive drooling and not being able swallow food and now being on a feeding tube in his stomach and poor brain function).  Step daughter was told that RN wants a virtual visit but the name, e-mail address and a phone # of a point of contact so that a [email or web address] could be set up.  Step daughter states she is waiting on a call back from the rehab facilty.

## 2019-03-29 NOTE — Telephone Encounter (Signed)
Pt step daughter(spoke with RN 01-20-19, no DPR on file) she is asking for a call from RN to discuss appointment options since pt is on feeding tube and has poor brain function

## 2019-03-30 NOTE — Telephone Encounter (Signed)
Pt step daughter has called back.  She was asked if she got the name and e-mail address of someone at the facility of where pt is.  She gave name of RN Randell Patient and was told to have office call them for Charlene's e-mail address for the [place] visit. RN Charlene's # at the facility is [phone]

## 2019-04-01 NOTE — Telephone Encounter (Signed)
Spoke with patient's step daughter [name] in regards to rescheduling patient's appointment. They have no preference as to whether the visit is done in office or through video, they just want to make sure patient gets rescheduled. I spoke with Tammy at [facility] [phone] and we scheduled an in office visit for pt in October.

## 2019-04-01 NOTE — Telephone Encounter (Signed)
Pt's stepdaughter called again wanting to know if the nurse was called for the email so that they can go ahead and get the VV appt scheduled. Please advise.

## 2019-04-12 ENCOUNTER — Ambulatory Visit: Payer: Medicare Other | Admitting: Family Medicine

## 2019-05-04 ENCOUNTER — Ambulatory Visit (INDEPENDENT_AMBULATORY_CARE_PROVIDER_SITE_OTHER): Payer: Medicare Other | Admitting: Neurology

## 2019-05-04 ENCOUNTER — Other Ambulatory Visit: Payer: Self-pay

## 2019-05-04 ENCOUNTER — Encounter: Payer: Self-pay | Admitting: Neurology

## 2019-05-04 VITALS — BP 145/75 | HR 85 | Temp 97.8°F

## 2019-05-04 DIAGNOSIS — R490 Dysphonia: Secondary | ICD-10-CM | POA: Diagnosis not present

## 2019-05-04 DIAGNOSIS — R1319 Other dysphagia: Secondary | ICD-10-CM

## 2019-05-04 DIAGNOSIS — R569 Unspecified convulsions: Secondary | ICD-10-CM | POA: Diagnosis not present

## 2019-05-04 DIAGNOSIS — J383 Other diseases of vocal cords: Secondary | ICD-10-CM

## 2019-05-04 DIAGNOSIS — I63311 Cerebral infarction due to thrombosis of right middle cerebral artery: Secondary | ICD-10-CM | POA: Diagnosis not present

## 2019-05-04 DIAGNOSIS — S0990XS Unspecified injury of head, sequela: Secondary | ICD-10-CM

## 2019-05-04 NOTE — Progress Notes (Signed)
[facility] [address] [place] [id number] [phone]       OFFICE CONSULT NOTE  Mr. Miguel [name] Date of Birth:  1950/12/04 Medical Record Number:  [id number]   Referring MD: Marvel PlanJindong Xu Reason for Referral: Stroke and dysphagia  HPI: Miguel [name] is a 68 year old African-American male seen today for initial office consultation visit.  History is obtained from his daughter as well as review of electronic medical records and have personally reviewed imaging films in PACS.  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  At that admission patient was found to have severely dysarthric speech but left hemiparesis to 6/5 strength in the upper extremity and 3/20 in the lower extremities.  His MRI scan showed a small right internal capsule infarct as well as failure of encephalomalacia in both frontal and left temporal lobe along with a VP shunt.  2D echo showed normal ejection fraction.  Urine drug screen was positive for marijuana.  LDL cholesterol is elevated 136 mg percent and hemoglobin A1c was 5.9.  There was a question of A. fib in the chart but no definite documentation was found hence patient was continued on antiplatelet therapy.  Patient recovered from this and was discharged home with home therapies.  He was doing well till August when he developed.  He was readmitted on 03/07/2019 with 2-day history of possible aspiration she had an event at home when he choked on breakfast and had difficulty tolerating oral intake for 2 days.  CT scan of the head was obtained on 03/07/2019 which showed no acute abnormalities.  He underwent modified barium swallow which showed severe oral deficits  tube feeds permanently recommended hence patient underwent PEG tube placed by interventional radiology on 03/15/2019.  Patient has since been in the nursing home and getting physical occupational speech therapy.  He is still unable to speak any words or swallow.  He has continued aspirin and medication for blood pressure and cholesterol.  He also remains on Keppra and has not not had any seizures for a long time since his initial head injury 15 years ago.  Patient was previously looked after at his home prior to his stroke and swallowing problems and the daughter now wonders if she will be able to care for him at home by herself.  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   [name]/notes 11/28/2010   Seizures (HCC)    [name]/notes 10/19/2009   Stroke (HCC) 09/2009   w/right sided weakness/notes 10/31/2009    Social History:  Social History   Socioeconomic History   Marital status: Widowed    Spouse name: Not on file   Number of children: Not on file   Years of education: Not on file   Highest education level: Not on file  Occupational History  Not on file  Social Needs   Financial resource strain: Not on file   Food insecurity    Worry: Not on file    Inability: Not on file   Transportation needs    Medical: Not on file    Non-medical: Not on file  Tobacco Use   Smoking status: Never Smoker   Smokeless tobacco: Former User    Types: Chew   Tobacco comment: "quit chewing in ~ 2010"  Substance and Sexual Activity   Alcohol use: Yes    Alcohol/week: 22.0 standard drinks     Types: 22 Shots of liquor per week    Comment: 09/07/2014 "1 pint maybe 2 days/wk"   Drug use: Yes    Types: Heroin    Comment: 09/07/2014 "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; been clean ~ 10 yrs"   Sexual activity: Not Currently  Lifestyle   Physical activity    Days per week: Not on file    Minutes per session: Not on file   Stress: Not on file  Relationships   Social connections    Talks on phone: Not on file    Gets together: Not on file    Attends religious service: Not on file    Active member of club or organization: Not on file    Attends meetings of clubs or organizations: Not on file    Relationship status: Not on file   Intimate partner violence    Fear of current or ex partner: Not on file    Emotionally abused: Not on file    Physically abused: Not on file    Forced sexual activity: Not on file  Other Topics Concern   Not on file  Social History Narrative   Not on file    Medications:   Current Outpatient Medications on File Prior to Visit  Medication Sig Dispense Refill   Amino Acids-Protein Hydrolys (FEEDING SUPPLEMENT, PRO-STAT SUGAR FREE 64,) LIQD Place 30 mLs into feeding tube daily at 12 noon. 887 mL 0   amLODipine (NORVASC) 5 MG tablet Place 1 tablet (5 mg total) into feeding tube daily. 90 tablet 1   aspirin 81 MG chewable tablet Place 1 tablet (81 mg total) into feeding tube daily. 30 tablet 0   atorvastatin (LIPITOR) 80 MG tablet Place 1 tablet (80 mg total) into feeding tube daily at 6 PM. 90 tablet 1   chlorhexidine (PERIDEX) 0.12 % solution 15 mLs by Mouth Rinse route 2 (two) times daily. 120 mL 0   famotidine (PEPCID) 20 MG tablet Place 20 mg into feeding tube 2 (two) times daily.     folic acid (FOLVITE) 1 MG tablet Place 1 tablet (1 mg total) into feeding tube daily. 90 tablet 0   ipratropium-albuterol (DUONEB) 0.5-2.5 (3) MG/3ML SOLN Take 3 mLs by nebulization every 6 (six) hours as needed. 360 mL    levETIRAcetam (KEPPRA) 100  MG/ML solution Place 500 mg into feeding tube 2 (two) times daily.     mouth rinse LIQD solution 15 mLs by Mouth Rinse route 2 times daily at 12 noon and 4 pm.  0   Nutritional Supplements (FEEDING SUPPLEMENT, OSMOLITE 1.5 CAL,) LIQD Place 474 mLs into feeding tube 3 (three) times daily.  0   polyethylene glycol powder (GLYCOLAX/MIRALAX) 17 GM/SCOOP powder Place 17 g into feeding tube daily. 3350 g 1   thiamine 100 MG tablet Take 1 tablet (100 mg total) by mouth daily. 30 tablet 2   Water For Irrigation,  Sterile (FREE WATER) SOLN Place 200 mLs into feeding tube 4 (four) times daily. 1000 mL 0   No current facility-administered medications on file prior to visit.     Allergies:  No Known Allergies  Physical Exam General: Frail middle-aged African-American male seated, in no evident distress Head: head normocephalic and atraumatic.   Neck: supple with no carotid or supraclavicular bruits Cardiovascular: regular rate and rhythm, no murmurs Musculoskeletal: no deformity.  Has left ankle foot brace Skin:  no rash/petichiae Vascular:  Normal pulses all extremities  Neurologic Exam Mental Status: Awake and fully alert.  Patient is mute and and arthritic but can follow simple midline in 1 and occasional two-step commands.  He nods with his head appropriately.  [place] is brisk. Cranial Nerves: Fundoscopic exam reveals sharp disc margins. Pupils equal, briskly reactive to light. Extraocular movements full without nystagmus. Visual fields full to confrontation. Hearing intact. Facial sensation intact.  Diminished facial expression.  Brisk jaw jerk.  Poor movements of the tongue.  Increase oral secretions.   Motor: Mild left hemiparesis 4/5 strength with weakness of left grip intrinsic hand muscles and left ankle dorsiflexors.  Diminished fine finger movements on the left.  Orbits right over left upper extremity.  Tone is slightly increased in all 4 extremities.. Sensory.: intact to touch ,  pinprick , position and vibratory sensation.  Coordination: Rapid alternating movements normal in all extremities. Finger-to-nose and heel-to-shin performed accurately bilaterally. Gait and Station: Deferred as patient is wheelchair-bound Reflexes: 3+ and symmetric. Toes downgoing.     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     PLAN: I had a long discussion with the patient and his daughter regarding his remote closed head injury and residual deficits with superimposed right internal capsule infarct in May 2020 and possibly another small brainstem infarct in August leading to severe dysphagia and anarthria and due to pseudobulbar state.  I recommend continue PEG tube feeding and physical occupational and speech therapy which is ongoing.  Continue aspirin for stroke prevention and strict control of hypertension with blood pressure goal below 130/90, lipids with LDL cholesterol goal below 70 mg percent.  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  He may return for follow-up in the future only as necessary.  Greater than 50% time during this 45-minute consultation visit was spent on counseling and coordination of care about his stroke, pseudobulbar state and dysphagia and answering questions no schedule appointment was made. Delia Heady, MD  [facility] [address] [place], [place] [address]  Phone [phone] Fax [phone] Note: This document was prepared with digital dictation and possible smart phrase technology. Any transcriptional errors that result from this process are unintentional.

## 2019-05-04 NOTE — Patient Instructions (Signed)
I had a long discussion with the patient and his daughter regarding his remote closed head injury and residual deficits with superimposed right internal capsule infarct in May 2020 and possibly another small brainstem infarct in August leading to severe dysphagia and anarthria and due to pseudobulbar state.  I recommend continue PEG tube feeding and physical occupational and speech therapy which is ongoing.  Continue aspirin for stroke prevention and strict control of hypertension with blood pressure goal below 130/90, lipids with LDL cholesterol goal below 70 mg percent.  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  He may return for follow-up in the future only as necessary.  No schedule appointment was made.

## 2019-06-16 ENCOUNTER — Inpatient Hospital Stay (HOSPITAL_COMMUNITY): Payer: Medicare Other

## 2019-06-16 ENCOUNTER — Encounter (HOSPITAL_COMMUNITY): Payer: Self-pay

## 2019-06-16 ENCOUNTER — Emergency Department (HOSPITAL_COMMUNITY): Payer: Medicare Other

## 2019-06-16 ENCOUNTER — Other Ambulatory Visit: Payer: Self-pay

## 2019-06-16 ENCOUNTER — Inpatient Hospital Stay (HOSPITAL_COMMUNITY)
Admission: EM | Admit: 2019-06-16 | Discharge: 2019-06-29 | DRG: 871 | Disposition: E | Payer: Medicare Other | Attending: Pulmonary Disease | Admitting: Pulmonary Disease

## 2019-06-16 DIAGNOSIS — F1010 Alcohol abuse, uncomplicated: Secondary | ICD-10-CM | POA: Diagnosis present

## 2019-06-16 DIAGNOSIS — E869 Volume depletion, unspecified: Secondary | ICD-10-CM | POA: Diagnosis present

## 2019-06-16 DIAGNOSIS — Z7982 Long term (current) use of aspirin: Secondary | ICD-10-CM

## 2019-06-16 DIAGNOSIS — A419 Sepsis, unspecified organism: Secondary | ICD-10-CM | POA: Diagnosis not present

## 2019-06-16 DIAGNOSIS — K559 Vascular disorder of intestine, unspecified: Secondary | ICD-10-CM | POA: Diagnosis present

## 2019-06-16 DIAGNOSIS — G913 Post-traumatic hydrocephalus, unspecified: Secondary | ICD-10-CM | POA: Diagnosis present

## 2019-06-16 DIAGNOSIS — R6521 Severe sepsis with septic shock: Secondary | ICD-10-CM | POA: Diagnosis present

## 2019-06-16 DIAGNOSIS — R011 Cardiac murmur, unspecified: Secondary | ICD-10-CM | POA: Diagnosis present

## 2019-06-16 DIAGNOSIS — U071 COVID-19: Secondary | ICD-10-CM | POA: Diagnosis present

## 2019-06-16 DIAGNOSIS — F1290 Cannabis use, unspecified, uncomplicated: Secondary | ICD-10-CM | POA: Diagnosis present

## 2019-06-16 DIAGNOSIS — E874 Mixed disorder of acid-base balance: Secondary | ICD-10-CM | POA: Diagnosis present

## 2019-06-16 DIAGNOSIS — G9349 Other encephalopathy: Secondary | ICD-10-CM | POA: Diagnosis present

## 2019-06-16 DIAGNOSIS — I2699 Other pulmonary embolism without acute cor pulmonale: Secondary | ICD-10-CM | POA: Diagnosis present

## 2019-06-16 DIAGNOSIS — G9340 Encephalopathy, unspecified: Secondary | ICD-10-CM

## 2019-06-16 DIAGNOSIS — F1190 Opioid use, unspecified, uncomplicated: Secondary | ICD-10-CM | POA: Diagnosis present

## 2019-06-16 DIAGNOSIS — Z931 Gastrostomy status: Secondary | ICD-10-CM

## 2019-06-16 DIAGNOSIS — E860 Dehydration: Secondary | ICD-10-CM | POA: Diagnosis present

## 2019-06-16 DIAGNOSIS — Z978 Presence of other specified devices: Secondary | ICD-10-CM

## 2019-06-16 DIAGNOSIS — R1310 Dysphagia, unspecified: Secondary | ICD-10-CM | POA: Diagnosis present

## 2019-06-16 DIAGNOSIS — J9601 Acute respiratory failure with hypoxia: Secondary | ICD-10-CM | POA: Diagnosis present

## 2019-06-16 DIAGNOSIS — Z982 Presence of cerebrospinal fluid drainage device: Secondary | ICD-10-CM

## 2019-06-16 DIAGNOSIS — I4891 Unspecified atrial fibrillation: Secondary | ICD-10-CM | POA: Diagnosis present

## 2019-06-16 DIAGNOSIS — A4189 Other specified sepsis: Secondary | ICD-10-CM | POA: Diagnosis present

## 2019-06-16 DIAGNOSIS — Z87820 Personal history of traumatic brain injury: Secondary | ICD-10-CM

## 2019-06-16 DIAGNOSIS — R6520 Severe sepsis without septic shock: Secondary | ICD-10-CM | POA: Diagnosis not present

## 2019-06-16 DIAGNOSIS — Z79899 Other long term (current) drug therapy: Secondary | ICD-10-CM

## 2019-06-16 DIAGNOSIS — Z87891 Personal history of nicotine dependence: Secondary | ICD-10-CM

## 2019-06-16 DIAGNOSIS — E870 Hyperosmolality and hypernatremia: Secondary | ICD-10-CM | POA: Diagnosis present

## 2019-06-16 DIAGNOSIS — Z823 Family history of stroke: Secondary | ICD-10-CM

## 2019-06-16 DIAGNOSIS — R0902 Hypoxemia: Secondary | ICD-10-CM | POA: Diagnosis present

## 2019-06-16 DIAGNOSIS — E1165 Type 2 diabetes mellitus with hyperglycemia: Secondary | ICD-10-CM

## 2019-06-16 DIAGNOSIS — G40909 Epilepsy, unspecified, not intractable, without status epilepticus: Secondary | ICD-10-CM | POA: Diagnosis present

## 2019-06-16 DIAGNOSIS — J439 Emphysema, unspecified: Secondary | ICD-10-CM | POA: Diagnosis present

## 2019-06-16 DIAGNOSIS — Z8673 Personal history of transient ischemic attack (TIA), and cerebral infarction without residual deficits: Secondary | ICD-10-CM

## 2019-06-16 DIAGNOSIS — I10 Essential (primary) hypertension: Secondary | ICD-10-CM | POA: Diagnosis present

## 2019-06-16 DIAGNOSIS — J1289 Other viral pneumonia: Secondary | ICD-10-CM | POA: Diagnosis present

## 2019-06-16 DIAGNOSIS — N170 Acute kidney failure with tubular necrosis: Secondary | ICD-10-CM | POA: Diagnosis present

## 2019-06-16 DIAGNOSIS — R739 Hyperglycemia, unspecified: Secondary | ICD-10-CM | POA: Diagnosis present

## 2019-06-16 DIAGNOSIS — R0602 Shortness of breath: Secondary | ICD-10-CM | POA: Diagnosis present

## 2019-06-16 DIAGNOSIS — R7303 Prediabetes: Secondary | ICD-10-CM | POA: Diagnosis present

## 2019-06-16 DIAGNOSIS — R569 Unspecified convulsions: Secondary | ICD-10-CM

## 2019-06-16 DIAGNOSIS — E1101 Type 2 diabetes mellitus with hyperosmolarity with coma: Secondary | ICD-10-CM | POA: Diagnosis not present

## 2019-06-16 DIAGNOSIS — E1100 Type 2 diabetes mellitus with hyperosmolarity without nonketotic hyperglycemic-hyperosmolar coma (NKHHC): Secondary | ICD-10-CM

## 2019-06-16 DIAGNOSIS — R7989 Other specified abnormal findings of blood chemistry: Secondary | ICD-10-CM

## 2019-06-16 DIAGNOSIS — Z86718 Personal history of other venous thrombosis and embolism: Secondary | ICD-10-CM

## 2019-06-16 DIAGNOSIS — D72829 Elevated white blood cell count, unspecified: Secondary | ICD-10-CM

## 2019-06-16 DIAGNOSIS — N179 Acute kidney failure, unspecified: Secondary | ICD-10-CM | POA: Diagnosis present

## 2019-06-16 DIAGNOSIS — J9600 Acute respiratory failure, unspecified whether with hypoxia or hypercapnia: Secondary | ICD-10-CM

## 2019-06-16 LAB — CBC WITH DIFFERENTIAL/PLATELET
Abs Immature Granulocytes: 0.08 10*3/uL — ABNORMAL HIGH (ref 0.00–0.07)
Basophils Absolute: 0 10*3/uL (ref 0.0–0.1)
Basophils Relative: 0 %
Eosinophils Absolute: 0 10*3/uL (ref 0.0–0.5)
Eosinophils Relative: 0 %
HCT: 54.7 % — ABNORMAL HIGH (ref 39.0–52.0)
Hemoglobin: 16 g/dL (ref 13.0–17.0)
Immature Granulocytes: 1 %
Lymphocytes Relative: 13 %
Lymphs Abs: 1.7 10*3/uL (ref 0.7–4.0)
MCH: 29.3 pg (ref 26.0–34.0)
MCHC: 29.3 g/dL — ABNORMAL LOW (ref 30.0–36.0)
MCV: 100 fL (ref 80.0–100.0)
Monocytes Absolute: 0.9 10*3/uL (ref 0.1–1.0)
Monocytes Relative: 7 %
Neutro Abs: 10.7 10*3/uL — ABNORMAL HIGH (ref 1.7–7.7)
Neutrophils Relative %: 79 %
Platelets: 309 10*3/uL (ref 150–400)
RBC: 5.47 MIL/uL (ref 4.22–5.81)
RDW: 18.5 % — ABNORMAL HIGH (ref 11.5–15.5)
WBC: 13.5 10*3/uL — ABNORMAL HIGH (ref 4.0–10.5)
nRBC: 0 % (ref 0.0–0.2)

## 2019-06-16 LAB — BLOOD GAS, ARTERIAL
Acid-base deficit: 3.1 mmol/L — ABNORMAL HIGH (ref 0.0–2.0)
Bicarbonate: 19.2 mmol/L — ABNORMAL LOW (ref 20.0–28.0)
O2 Content: 5 L/min
O2 Saturation: 93.7 %
Patient temperature: 98.6
pCO2 arterial: 28.9 mmHg — ABNORMAL LOW (ref 32.0–48.0)
pH, Arterial: 7.437 (ref 7.350–7.450)
pO2, Arterial: 78.3 mmHg — ABNORMAL LOW (ref 83.0–108.0)

## 2019-06-16 LAB — URINALYSIS, ROUTINE W REFLEX MICROSCOPIC
Bacteria, UA: NONE SEEN
Bilirubin Urine: NEGATIVE
Glucose, UA: >=500 mg/dL — AB
Hgb urine dipstick: NEGATIVE
Ketones, ur: NEGATIVE mg/dL
Leukocytes,Ua: NEGATIVE
Nitrite: NEGATIVE
Protein, ur: NEGATIVE mg/dL
Specific Gravity, Urine: 1.027 (ref 1.005–1.030)
pH: 5 (ref 5.0–8.0)

## 2019-06-16 LAB — BASIC METABOLIC PANEL
Anion gap: 17 — ABNORMAL HIGH (ref 5–15)
BUN: 71 mg/dL — ABNORMAL HIGH (ref 8–23)
CO2: 17 mmol/L — ABNORMAL LOW (ref 22–32)
Calcium: 8.4 mg/dL — ABNORMAL LOW (ref 8.9–10.3)
Chloride: 125 mmol/L — ABNORMAL HIGH (ref 98–111)
Creatinine, Ser: 2.57 mg/dL — ABNORMAL HIGH (ref 0.61–1.24)
GFR calc Af Amer: 29 mL/min — ABNORMAL LOW (ref >60)
GFR calc non Af Amer: 25 mL/min — ABNORMAL LOW (ref >60)
Glucose, Bld: 982 mg/dL (ref 70–99)
Potassium: 4.5 mmol/L (ref 3.5–5.1)
Sodium: 159 mmol/L — ABNORMAL HIGH (ref 135–145)

## 2019-06-16 LAB — COMPREHENSIVE METABOLIC PANEL
ALT: 163 U/L — ABNORMAL HIGH (ref 0–44)
AST: 63 U/L — ABNORMAL HIGH (ref 15–41)
Albumin: 2.9 g/dL — ABNORMAL LOW (ref 3.5–5.0)
Alkaline Phosphatase: 76 U/L (ref 38–126)
Anion gap: 16 — ABNORMAL HIGH (ref 5–15)
BUN: 75 mg/dL — ABNORMAL HIGH (ref 8–23)
CO2: 18 mmol/L — ABNORMAL LOW (ref 22–32)
Calcium: 8.9 mg/dL (ref 8.9–10.3)
Chloride: 120 mmol/L — ABNORMAL HIGH (ref 98–111)
Creatinine, Ser: 2.48 mg/dL — ABNORMAL HIGH (ref 0.61–1.24)
GFR calc Af Amer: 30 mL/min — ABNORMAL LOW (ref >60)
GFR calc non Af Amer: 26 mL/min — ABNORMAL LOW (ref >60)
Glucose, Bld: 1200 mg/dL (ref 70–99)
Potassium: 5.3 mmol/L — ABNORMAL HIGH (ref 3.5–5.1)
Sodium: 154 mmol/L — ABNORMAL HIGH (ref 135–145)
Total Bilirubin: 1 mg/dL (ref 0.3–1.2)
Total Protein: 7.3 g/dL (ref 6.5–8.1)

## 2019-06-16 LAB — GLUCOSE, CAPILLARY
Glucose-Capillary: >600 mg/dL (ref 70–99)
Glucose-Capillary: >600 mg/dL (ref 70–99)

## 2019-06-16 LAB — LACTIC ACID, PLASMA
Lactic Acid, Venous: 6.2 mmol/L (ref 0.5–1.9)
Lactic Acid, Venous: 8 mmol/L (ref 0.5–1.9)

## 2019-06-16 LAB — C-REACTIVE PROTEIN: CRP: 4.6 mg/dL — ABNORMAL HIGH (ref <1.0)

## 2019-06-16 LAB — TRIGLYCERIDES: Triglycerides: 178 mg/dL — ABNORMAL HIGH (ref <150)

## 2019-06-16 LAB — PROCALCITONIN: Procalcitonin: 0.88 ng/mL

## 2019-06-16 LAB — FIBRINOGEN: Fibrinogen: 618 mg/dL — ABNORMAL HIGH (ref 210–475)

## 2019-06-16 LAB — D-DIMER, QUANTITATIVE: D-Dimer, Quant: 11.13 ug/mL-FEU — ABNORMAL HIGH (ref 0.00–0.50)

## 2019-06-16 LAB — TROPONIN I (HIGH SENSITIVITY): Troponin I (High Sensitivity): 48 ng/L — ABNORMAL HIGH (ref <18)

## 2019-06-16 LAB — FERRITIN: Ferritin: 890 ng/mL — ABNORMAL HIGH (ref 24–336)

## 2019-06-16 LAB — LACTATE DEHYDROGENASE: LDH: 241 U/L — ABNORMAL HIGH (ref 98–192)

## 2019-06-16 MED ORDER — INSULIN REGULAR(HUMAN) IN NACL 100-0.9 UT/100ML-% IV SOLN
INTRAVENOUS | Status: DC
  Filled 2019-06-16: qty 100

## 2019-06-16 MED ORDER — DEXTROSE 50 % IV SOLN
0.0000 mL | INTRAVENOUS | Status: DC | PRN
  Filled 2019-06-16: qty 50

## 2019-06-16 MED ORDER — SODIUM CHLORIDE 0.9 % IV SOLN
INTRAVENOUS | Status: DC
  Administered 2019-06-16: 21:00:00 via INTRAVENOUS

## 2019-06-16 MED ORDER — SODIUM CHLORIDE 0.9 % IV BOLUS: 1000.0000 mL | Freq: Once | INTRAVENOUS | Status: DC

## 2019-06-16 MED ORDER — IOHEXOL 350 MG/ML SOLN
100.0000 mL | Freq: Once | INTRAVENOUS | Status: AC | PRN
  Administered 2019-06-17: 80 mL via INTRAVENOUS

## 2019-06-16 MED ORDER — ATORVASTATIN CALCIUM 40 MG PO TABS: 80.0000 mg | ORAL_TABLET | Freq: Every day | ORAL | Status: DC

## 2019-06-16 MED ORDER — SODIUM CHLORIDE (PF) 0.9 % IJ SOLN
INTRAMUSCULAR | Status: AC
  Filled 2019-06-16: qty 50

## 2019-06-16 MED ORDER — SODIUM CHLORIDE 0.9 % IV SOLN: 2.0000 g | INTRAVENOUS | Status: DC

## 2019-06-16 MED ORDER — CHLORHEXIDINE GLUCONATE 0.12 % MT SOLN
15.0000 mL | Freq: Two times a day (BID) | OROMUCOSAL | Status: DC
  Administered 2019-06-16: 15 mL via OROMUCOSAL

## 2019-06-16 MED ORDER — DEXTROSE-NACL 5-0.45 % IV SOLN: INTRAVENOUS | Status: DC

## 2019-06-16 MED ORDER — IPRATROPIUM-ALBUTEROL 0.5-2.5 (3) MG/3ML IN SOLN: 3.0000 mL | Freq: Four times a day (QID) | RESPIRATORY_TRACT | Status: DC | PRN

## 2019-06-16 MED ORDER — SODIUM CHLORIDE 0.9 % IV SOLN: INTRAVENOUS | Status: DC

## 2019-06-16 MED ORDER — ASPIRIN 81 MG PO CHEW: 81.0000 mg | CHEWABLE_TABLET | Freq: Every day | ORAL | Status: DC

## 2019-06-16 MED ORDER — DEXTROSE-NACL 5-0.45 % IV SOLN
INTRAVENOUS | Status: DC
  Administered 2019-06-17: 06:00:00 via INTRAVENOUS

## 2019-06-16 MED ORDER — FAMOTIDINE 20 MG PO TABS
20.0000 mg | ORAL_TABLET | Freq: Two times a day (BID) | ORAL | Status: DC
  Filled 2019-06-16: qty 1

## 2019-06-16 MED ORDER — ACETAMINOPHEN 160 MG/5ML PO SOLN
650.0000 mg | Freq: Once | ORAL | Status: AC
  Administered 2019-06-16: 650 mg via ORAL
  Filled 2019-06-16: qty 20.3

## 2019-06-16 MED ORDER — SODIUM CHLORIDE 0.9 % IV BOLUS
1000.0000 mL | Freq: Once | INTRAVENOUS | Status: AC
  Administered 2019-06-16: 1000 mL via INTRAVENOUS

## 2019-06-16 MED ORDER — VANCOMYCIN HCL 10 G IV SOLR
1250.0000 mg | Freq: Once | INTRAVENOUS | Status: AC
  Administered 2019-06-17: 1250 mg via INTRAVENOUS
  Filled 2019-06-16 (×2): qty 1250

## 2019-06-16 MED ORDER — SODIUM CHLORIDE 0.9 % IV SOLN
2.0000 g | Freq: Once | INTRAVENOUS | Status: AC
  Administered 2019-06-16: 2 g via INTRAVENOUS
  Filled 2019-06-16: qty 2

## 2019-06-16 MED ORDER — ORAL CARE MOUTH RINSE
15.0000 mL | Freq: Two times a day (BID) | OROMUCOSAL | Status: DC
  Administered 2019-06-17: 15 mL via OROMUCOSAL

## 2019-06-16 MED ORDER — HEPARIN SODIUM (PORCINE) 5000 UNIT/ML IJ SOLN
5000.0000 [IU] | Freq: Three times a day (TID) | INTRAMUSCULAR | Status: DC
  Administered 2019-06-17: 5000 [IU] via SUBCUTANEOUS
  Filled 2019-06-16 (×2): qty 1

## 2019-06-16 MED ORDER — LEVETIRACETAM 100 MG/ML PO SOLN
500.0000 mg | Freq: Two times a day (BID) | ORAL | Status: DC
  Filled 2019-06-16 (×2): qty 5

## 2019-06-16 MED ORDER — DEXTROSE 50 % IV SOLN: 0.0000 mL | INTRAVENOUS | Status: DC | PRN

## 2019-06-16 MED ORDER — INSULIN REGULAR(HUMAN) IN NACL 100-0.9 UT/100ML-% IV SOLN
INTRAVENOUS | Status: DC
  Administered 2019-06-16: 6 [IU]/h via INTRAVENOUS
  Filled 2019-06-16: qty 100

## 2019-06-16 NOTE — Progress Notes (Signed)
ABG drawn by RT and sent to lab. Lab notified of incoming sample.
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
## 2019-06-16 NOTE — Plan of Care (Signed)
Spoke with Daughter Miguel [name]. Spoke with her concerning contrasted CT scan and increased risk of worsening AKI and need for dailysis with this. She is okay with pursuing CTA chest despite potential worsening of renal function and need for dialysis. However think that ruling out PE and other source of infection important. Also will attain head CT as well to ensure safety of starting [name]   Newell Coral DO Internal Medicine/Pediatrics Pulmonary and Critical Care Fellow PGY-6

## 2019-06-16 NOTE — ED Triage Notes (Signed)
Per EMS: Pt from [facility].  Pt has had increased SOB.  Pt tested positive for COVID 3.5 weeks ago.  Pt placed on non-rebreather. CBG greater than 500 LAC 20 Gauge 112/76

## 2019-06-16 NOTE — Progress Notes (Signed)
A consult was received from an ED physician for vancomhycin per pharmacy dosing.  The patient's profile has been reviewed for ht/wt/allergies/indication/available labs.   Used wt from 03/22/2019 = 67 kg.  A one time order has been placed for vancomycin 1250 mg IV x 1 dose.  Further antibiotics/pharmacy consults should be ordered by admitting physician if indicated.                       Thank you,  Eudelia Bunch, Pharm.D [phone] 07-14-2019 5:39 PM

## 2019-06-17 ENCOUNTER — Inpatient Hospital Stay (HOSPITAL_COMMUNITY): Payer: Medicare Other

## 2019-06-17 ENCOUNTER — Encounter (HOSPITAL_COMMUNITY): Payer: Self-pay

## 2019-06-17 DIAGNOSIS — U071 COVID-19: Secondary | ICD-10-CM

## 2019-06-17 DIAGNOSIS — G9340 Encephalopathy, unspecified: Secondary | ICD-10-CM

## 2019-06-17 DIAGNOSIS — A419 Sepsis, unspecified organism: Secondary | ICD-10-CM

## 2019-06-17 DIAGNOSIS — E1101 Type 2 diabetes mellitus with hyperosmolarity with coma: Secondary | ICD-10-CM

## 2019-06-17 DIAGNOSIS — R6521 Severe sepsis with septic shock: Secondary | ICD-10-CM

## 2019-06-17 DIAGNOSIS — R0602 Shortness of breath: Secondary | ICD-10-CM

## 2019-06-17 DIAGNOSIS — I2699 Other pulmonary embolism without acute cor pulmonale: Secondary | ICD-10-CM

## 2019-06-17 LAB — BLOOD GAS, VENOUS
Acid-base deficit: 4.9 mmol/L — ABNORMAL HIGH (ref 0.0–2.0)
Bicarbonate: 24.2 mmol/L (ref 20.0–28.0)
O2 Saturation: 73.8 %
Patient temperature: 98.6
pCO2, Ven: 62.8 mmHg — ABNORMAL HIGH (ref 44.0–60.0)
pH, Ven: 7.211 — ABNORMAL LOW (ref 7.250–7.430)
pO2, Ven: 53 mmHg — ABNORMAL HIGH (ref 32.0–45.0)

## 2019-06-17 LAB — BASIC METABOLIC PANEL
Anion gap: 14 (ref 5–15)
BUN: 67 mg/dL — ABNORMAL HIGH (ref 8–23)
BUN: 66 mg/dL — ABNORMAL HIGH (ref 8–23)
CO2: 16 mmol/L — ABNORMAL LOW (ref 22–32)
CO2: 23 mmol/L (ref 22–32)
Calcium: 7.9 mg/dL — ABNORMAL LOW (ref 8.9–10.3)
Calcium: 7.5 mg/dL — ABNORMAL LOW (ref 8.9–10.3)
Chloride: 130 mmol/L — ABNORMAL HIGH (ref 98–111)
Chloride: >130 mmol/L (ref 98–111)
Creatinine, Ser: 2.48 mg/dL — ABNORMAL HIGH (ref 0.61–1.24)
Creatinine, Ser: 2.5 mg/dL — ABNORMAL HIGH (ref 0.61–1.24)
GFR calc Af Amer: 30 mL/min — ABNORMAL LOW (ref >60)
GFR calc Af Amer: 29 mL/min — ABNORMAL LOW (ref >60)
GFR calc non Af Amer: 26 mL/min — ABNORMAL LOW (ref >60)
GFR calc non Af Amer: 25 mL/min — ABNORMAL LOW (ref >60)
Glucose, Bld: 476 mg/dL — ABNORMAL HIGH (ref 70–99)
Glucose, Bld: 188 mg/dL — ABNORMAL HIGH (ref 70–99)
Potassium: 3.2 mmol/L — ABNORMAL LOW (ref 3.5–5.1)
Potassium: 4.5 mmol/L (ref 3.5–5.1)
Sodium: 160 mmol/L — ABNORMAL HIGH (ref 135–145)
Sodium: 172 mmol/L (ref 135–145)

## 2019-06-17 LAB — BRAIN NATRIURETIC PEPTIDE: B Natriuretic Peptide: 100.5 pg/mL — ABNORMAL HIGH (ref 0.0–100.0)

## 2019-06-17 LAB — PHOSPHORUS: Phosphorus: 3.5 mg/dL (ref 2.5–4.6)

## 2019-06-17 LAB — CBC
HCT: 52.9 % — ABNORMAL HIGH (ref 39.0–52.0)
Hemoglobin: 15.2 g/dL (ref 13.0–17.0)
MCH: 29.1 pg (ref 26.0–34.0)
MCHC: 28.7 g/dL — ABNORMAL LOW (ref 30.0–36.0)
MCV: 101.3 fL — ABNORMAL HIGH (ref 80.0–100.0)
Platelets: 261 10*3/uL (ref 150–400)
RBC: 5.22 MIL/uL (ref 4.22–5.81)
RDW: 17.2 % — ABNORMAL HIGH (ref 11.5–15.5)
WBC: 16 10*3/uL — ABNORMAL HIGH (ref 4.0–10.5)
nRBC: 0 % (ref 0.0–0.2)

## 2019-06-17 LAB — APTT: aPTT: 31 seconds (ref 24–36)

## 2019-06-17 LAB — BLOOD GAS, ARTERIAL
Acid-base deficit: 3.1 mmol/L — ABNORMAL HIGH (ref 0.0–2.0)
Bicarbonate: 21.8 mmol/L (ref 20.0–28.0)
Drawn by: 103701
FIO2: 100
O2 Saturation: 91.5 %
PEEP: 5 cmH2O
Patient temperature: 98.6
RATE: 22 resp/min
pCO2 arterial: 40.7 mmHg (ref 32.0–48.0)
pH, Arterial: 7.349 — ABNORMAL LOW (ref 7.350–7.450)
pO2, Arterial: 69.2 mmHg — ABNORMAL LOW (ref 83.0–108.0)

## 2019-06-17 LAB — PROTIME-INR
INR: 1.5 — ABNORMAL HIGH (ref 0.8–1.2)
Prothrombin Time: 17.6 seconds — ABNORMAL HIGH (ref 11.4–15.2)

## 2019-06-17 LAB — LACTIC ACID, PLASMA
Lactic Acid, Venous: 5.4 mmol/L (ref 0.5–1.9)
Lactic Acid, Venous: 4.1 mmol/L (ref 0.5–1.9)

## 2019-06-17 LAB — URINE CULTURE: Culture: NO GROWTH

## 2019-06-17 LAB — GLUCOSE, CAPILLARY
Glucose-Capillary: 116 mg/dL — ABNORMAL HIGH (ref 70–99)
Glucose-Capillary: 118 mg/dL — ABNORMAL HIGH (ref 70–99)
Glucose-Capillary: 174 mg/dL — ABNORMAL HIGH (ref 70–99)
Glucose-Capillary: 198 mg/dL — ABNORMAL HIGH (ref 70–99)
Glucose-Capillary: 244 mg/dL — ABNORMAL HIGH (ref 70–99)
Glucose-Capillary: 245 mg/dL — ABNORMAL HIGH (ref 70–99)
Glucose-Capillary: 245 mg/dL — ABNORMAL HIGH (ref 70–99)
Glucose-Capillary: 409 mg/dL — ABNORMAL HIGH (ref 70–99)
Glucose-Capillary: 427 mg/dL — ABNORMAL HIGH (ref 70–99)
Glucose-Capillary: 435 mg/dL — ABNORMAL HIGH (ref 70–99)
Glucose-Capillary: 511 mg/dL (ref 70–99)
Glucose-Capillary: 588 mg/dL (ref 70–99)
Glucose-Capillary: >600 mg/dL (ref 70–99)
Glucose-Capillary: >600 mg/dL (ref 70–99)

## 2019-06-17 LAB — MRSA PCR SCREENING: MRSA by PCR: NEGATIVE

## 2019-06-17 LAB — OSMOLALITY
Osmolality: 426 mOsm/kg (ref 275–295)
Osmolality: 400 mOsm/kg (ref 275–295)

## 2019-06-17 LAB — MAGNESIUM: Magnesium: 3 mg/dL — ABNORMAL HIGH (ref 1.7–2.4)

## 2019-06-17 LAB — SARS CORONAVIRUS 2 (TAT 6-24 HRS): SARS Coronavirus 2: POSITIVE — AB

## 2019-06-17 MED ORDER — ALBUMIN HUMAN 5 % IV SOLN: 12.5000 g | Freq: Once | INTRAVENOUS | Status: DC

## 2019-06-17 MED ORDER — ALBUMIN HUMAN 25 % IV SOLN
INTRAVENOUS | Status: AC
  Filled 2019-06-17: qty 50

## 2019-06-17 MED ORDER — POTASSIUM CHLORIDE 10 MEQ/50ML IV SOLN
10.0000 meq | INTRAVENOUS | Status: AC
  Administered 2019-06-17 (×3): 10 meq via INTRAVENOUS
  Filled 2019-06-17 (×4): qty 50

## 2019-06-17 MED ORDER — ALBUMIN HUMAN 25 % IV SOLN
INTRAVENOUS | Status: AC
  Filled 2019-06-17: qty 100

## 2019-06-17 MED ORDER — LACTATED RINGERS IV BOLUS
1000.0000 mL | Freq: Once | INTRAVENOUS | Status: AC
  Administered 2019-06-17: 1000 mL via INTRAVENOUS

## 2019-06-17 MED ORDER — FENTANYL CITRATE (PF) 100 MCG/2ML IJ SOLN
100.0000 ug | Freq: Once | INTRAMUSCULAR | Status: AC
  Administered 2019-06-17: 100 ug via INTRAVENOUS

## 2019-06-17 MED ORDER — HEPARIN BOLUS VIA INFUSION
2000.0000 [IU] | Freq: Once | INTRAVENOUS | Status: AC
  Administered 2019-06-17: 2000 [IU] via INTRAVENOUS
  Filled 2019-06-17: qty 2000

## 2019-06-17 MED ORDER — CHLORHEXIDINE GLUCONATE 0.12% ORAL RINSE (MEDLINE KIT)
15.0000 mL | Freq: Two times a day (BID) | OROMUCOSAL | Status: DC
  Administered 2019-06-17: 15 mL via OROMUCOSAL

## 2019-06-17 MED ORDER — FENTANYL 2500MCG IN NS 250ML (10MCG/ML) PREMIX INFUSION
0.0000 ug/h | INTRAVENOUS | Status: DC
  Administered 2019-06-17: 50 ug/h via INTRAVENOUS
  Filled 2019-06-17: qty 250

## 2019-06-17 MED ORDER — ROCURONIUM BROMIDE 50 MG/5ML IV SOLN
100.0000 mg | Freq: Once | INTRAVENOUS | Status: AC
  Administered 2019-06-17: 100 mg via INTRAVENOUS
  Filled 2019-06-17: qty 10

## 2019-06-17 MED ORDER — VASOPRESSIN 20 UNIT/ML IV SOLN
0.0300 [IU]/min | INTRAVENOUS | Status: DC
  Administered 2019-06-17: 0.03 [IU]/min via INTRAVENOUS
  Filled 2019-06-17: qty 2

## 2019-06-17 MED ORDER — NOREPINEPHRINE 4 MG/250ML-% IV SOLN
INTRAVENOUS | Status: AC
  Filled 2019-06-17: qty 250

## 2019-06-17 MED ORDER — SODIUM BICARBONATE 8.4 % IV SOLN
INTRAVENOUS | Status: AC
  Administered 2019-06-17: 50 meq
  Filled 2019-06-17: qty 50

## 2019-06-17 MED ORDER — ETOMIDATE 2 MG/ML IV SOLN
20.0000 mg | Freq: Once | INTRAVENOUS | Status: AC
  Administered 2019-06-17: 20 mg via INTRAVENOUS

## 2019-06-17 MED ORDER — SODIUM CHLORIDE 0.9% FLUSH: 10.0000 mL | INTRAVENOUS | Status: DC | PRN

## 2019-06-17 MED ORDER — SODIUM CHLORIDE 0.9% FLUSH: 10.0000 mL | Freq: Two times a day (BID) | INTRAVENOUS | Status: DC

## 2019-06-17 MED ORDER — SODIUM CHLORIDE 0.9 % IV SOLN
500.0000 mg | Freq: Two times a day (BID) | INTRAVENOUS | Status: DC
  Administered 2019-06-17: 500 mg via INTRAVENOUS
  Filled 2019-06-17: qty 500
  Filled 2019-06-17: qty 0.5

## 2019-06-17 MED ORDER — VANCOMYCIN HCL 10 G IV SOLR: 1250.0000 mg | Freq: Once | INTRAVENOUS | Status: DC

## 2019-06-17 MED ORDER — CHLORHEXIDINE GLUCONATE CLOTH 2 % EX PADS
6.0000 | MEDICATED_PAD | Freq: Every day | CUTANEOUS | Status: DC
  Administered 2019-06-17: 6 via TOPICAL

## 2019-06-17 MED ORDER — FAMOTIDINE 40 MG/5ML PO SUSR
20.0000 mg | Freq: Two times a day (BID) | ORAL | Status: DC
  Filled 2019-06-17 (×2): qty 2.5

## 2019-06-17 MED ORDER — FENTANYL CITRATE (PF) 100 MCG/2ML IJ SOLN
INTRAMUSCULAR | Status: AC
  Filled 2019-06-17: qty 2

## 2019-06-17 MED ORDER — ALBUMIN HUMAN 5 % IV SOLN
25.0000 g | Freq: Once | INTRAVENOUS | Status: AC
  Administered 2019-06-17: 25 g via INTRAVENOUS
  Filled 2019-06-17: qty 250

## 2019-06-17 MED ORDER — SODIUM CHLORIDE 0.9 % IV BOLUS: 500.0000 mL | Freq: Once | INTRAVENOUS | Status: DC

## 2019-06-17 MED ORDER — HEPARIN (PORCINE) 25000 UT/250ML-% IV SOLN
1200.0000 [IU]/h | INTRAVENOUS | Status: DC
  Administered 2019-06-17: 1200 [IU]/h via INTRAVENOUS
  Filled 2019-06-17: qty 250

## 2019-06-17 MED ORDER — SODIUM CHLORIDE 0.9 % IV SOLN: INTRAVENOUS | Status: DC | PRN

## 2019-06-17 MED ORDER — NOREPINEPHRINE 4 MG/250ML-% IV SOLN
0.0000 ug/min | INTRAVENOUS | Status: DC
  Administered 2019-06-17: 20 ug/min via INTRAVENOUS
  Administered 2019-06-17 (×2): 40 ug/min via INTRAVENOUS
  Filled 2019-06-17: qty 250
  Filled 2019-06-17: qty 500
  Filled 2019-06-17: qty 250

## 2019-06-17 MED ORDER — VANCOMYCIN HCL 10 G IV SOLR: 1250.0000 mg | INTRAVENOUS | Status: DC

## 2019-06-17 MED ORDER — STERILE WATER FOR INJECTION IV SOLN
INTRAVENOUS | Status: DC
  Administered 2019-06-17: 05:00:00 via INTRAVENOUS
  Filled 2019-06-17: qty 850

## 2019-06-17 MED ORDER — LEVETIRACETAM IN NACL 500 MG/100ML IV SOLN
500.0000 mg | Freq: Two times a day (BID) | INTRAVENOUS | Status: DC
  Administered 2019-06-17 (×2): 500 mg via INTRAVENOUS
  Filled 2019-06-17 (×3): qty 100

## 2019-06-18 LAB — HEMOGLOBIN A1C
Hgb A1c MFr Bld: 9.6 % — ABNORMAL HIGH (ref 4.8–5.6)
Mean Plasma Glucose: 229 mg/dL

## 2019-06-19 LAB — CULTURE, RESPIRATORY W GRAM STAIN: Culture: NORMAL

## 2019-06-20 IMAGING — CT CT HEAD W/O CM
3 series · 16 of 47 positions shown, 19 images · non-contrast
Comparison: 03/17/2017

CLINICAL DATA: VP shunt with possible seizure

EXAM:
CT HEAD WITHOUT CONTRAST
TECHNIQUE: Contiguous axial images were obtained from the base of the skull
through the vertex without intravenous contrast.

[Series 2: head wo · axial · 0.48mm/px · z∈[-175,-45]mm · 10 of 32 slices shown, 13 images]
[im 3/32  brain]
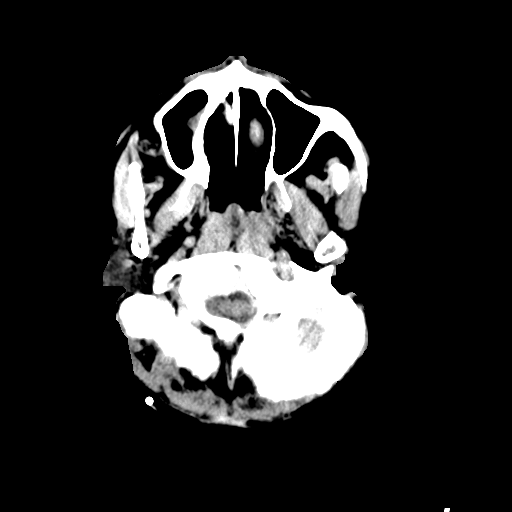
[im 3/32  bone]
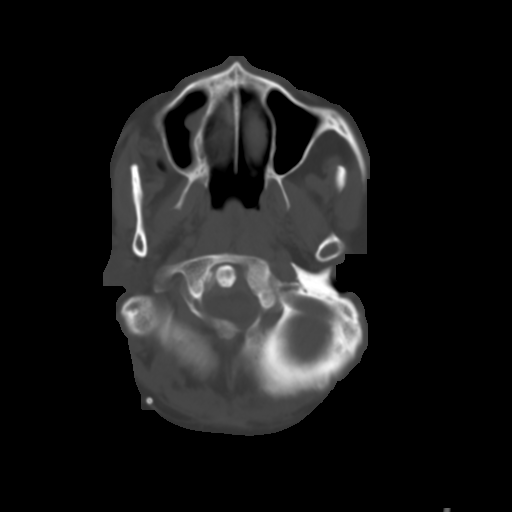
[im 6/32  brain]
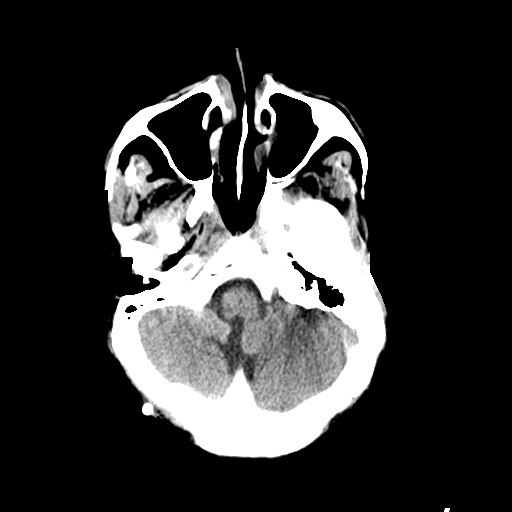
[im 9/32  brain]
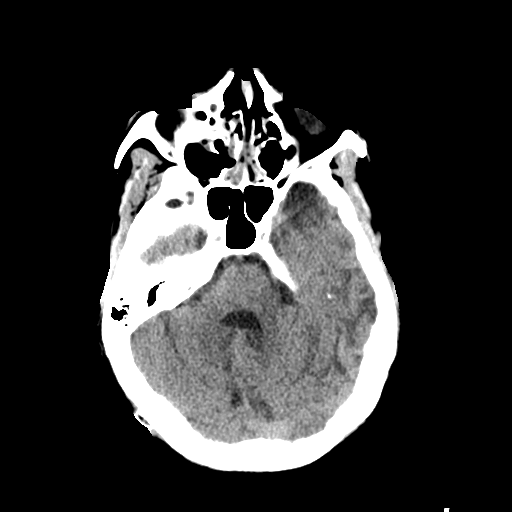
[im 11/32  brain]
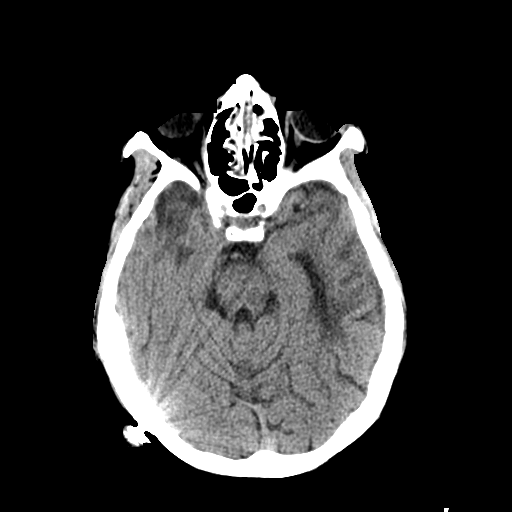
[im 14/32  brain]
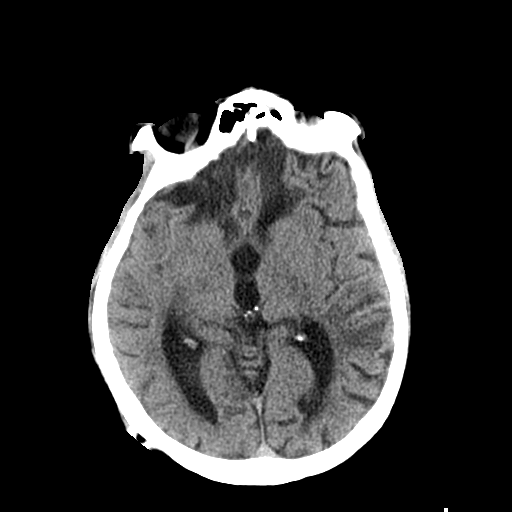
[im 14/32  bone]
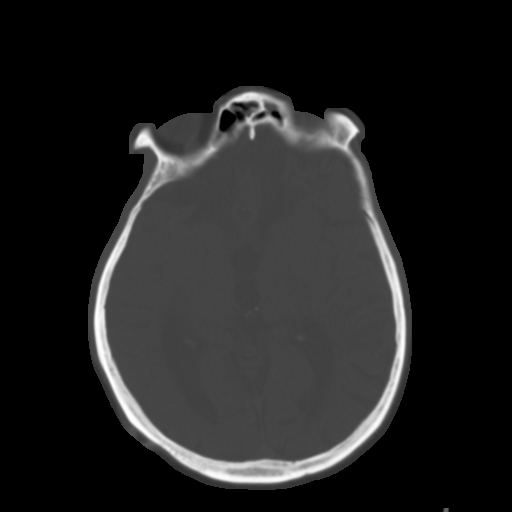
[im 18/32  brain]
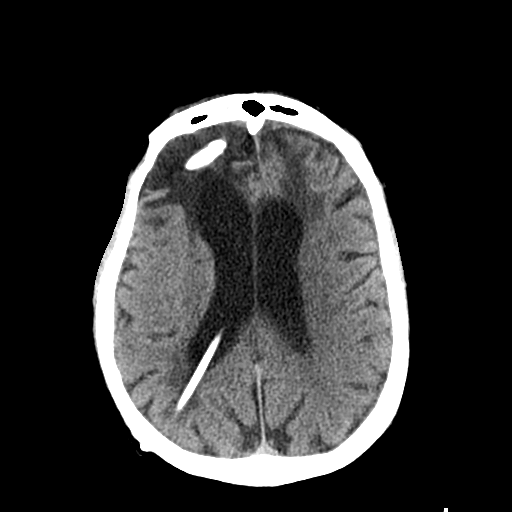
[im 21/32  brain]
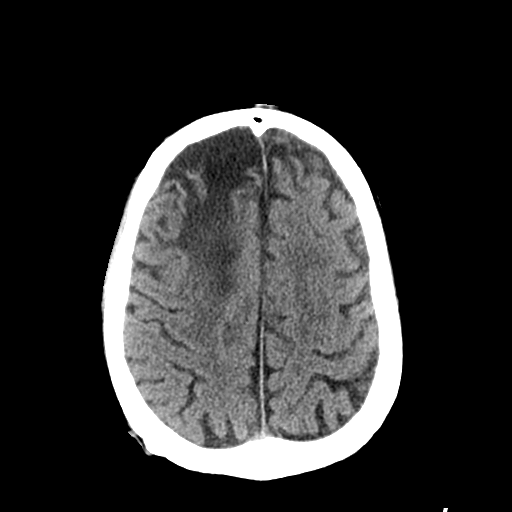
[im 24/32  brain]
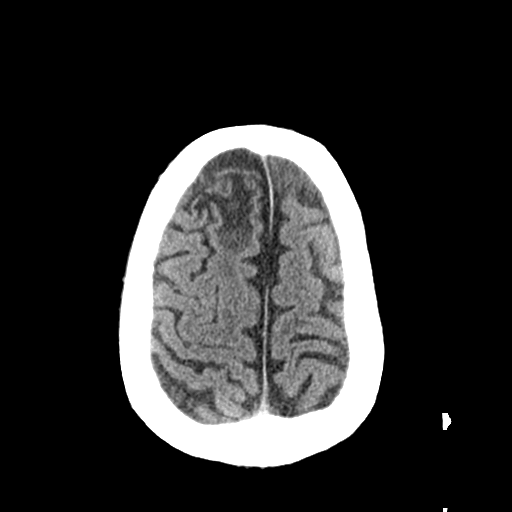
[im 26/32  brain]
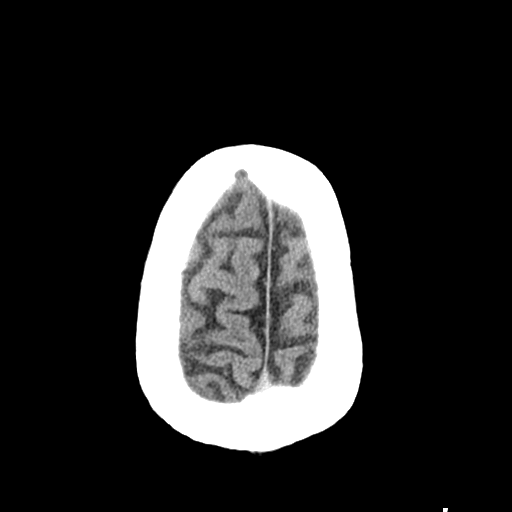
[im 26/32  bone]
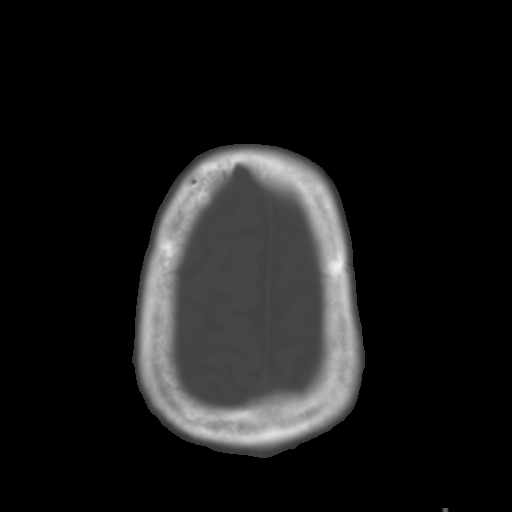
[im 29/32  brain]
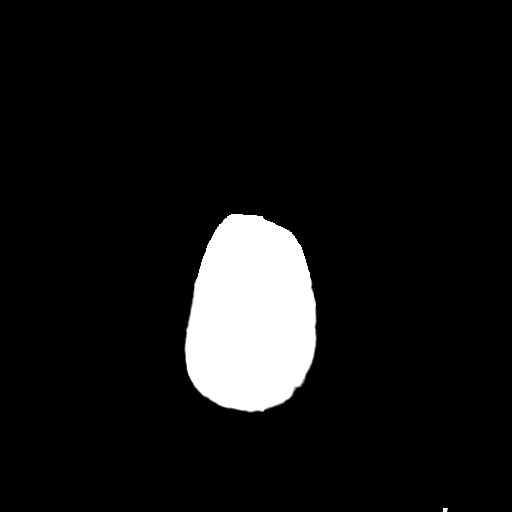

[Series 5: coronal soft tissue · coronal · 0.30mm/px · 3 of 68 slices shown]
[im 23/68  brain]
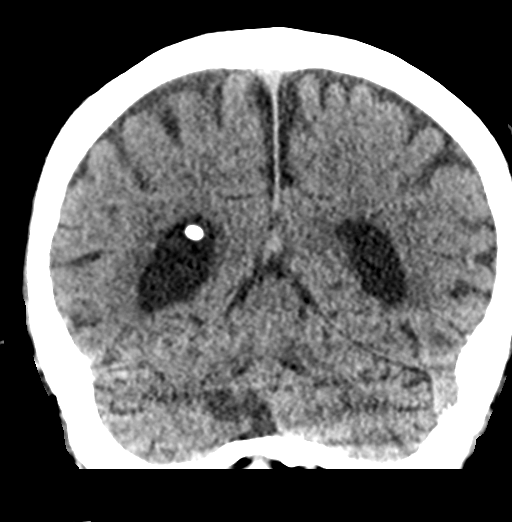
[im 30/68  brain]
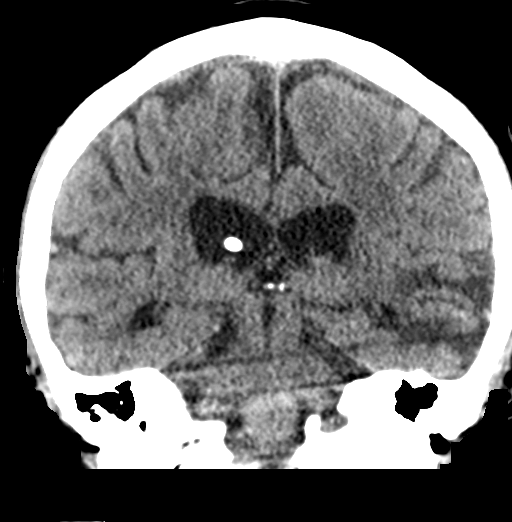
[im 38/68  brain]
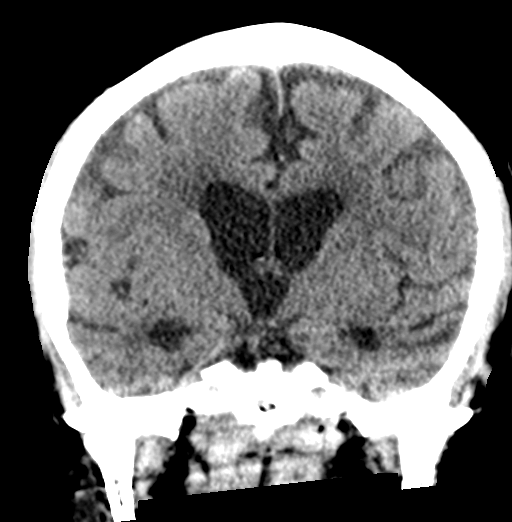

[Series 6: sagittal soft tissue · sagittal · 0.31mm/px · 3 of 52 slices shown]
[im 18/52  brain]
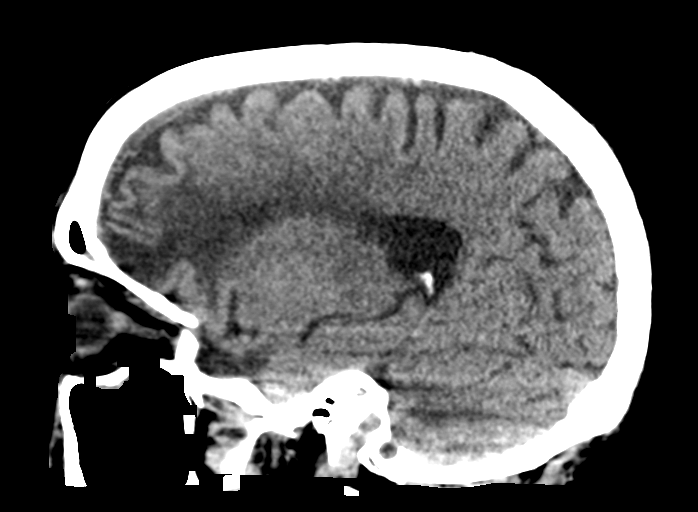
[im 26/52  brain]
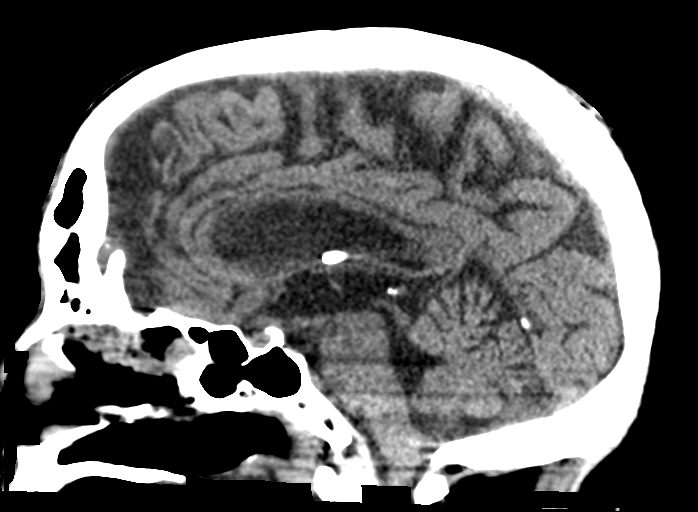
[im 35/52  brain]
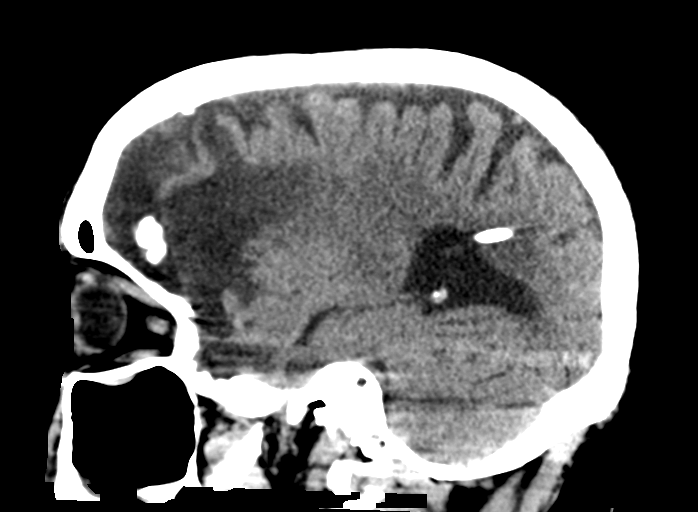

[16 of 47 positions shown; findings below may reference images not displayed]

FINDINGS: Brain: Atrophic brain with bifrontal and left more than right
temporal lobe encephalomalacia, likely posttraumatic. There is
generalized atrophy. Remote lacunar/perforator infarct in the
lateral left thalamus and corona radiata. VP shunt from a right
parietal approach with stable ventricular size.

Vascular: Atherosclerotic calcification.

Skull: No acute finding.

Sinuses/Orbits: Active sinusitis.
IMPRESSION: 1. No acute finding.
2. Extensive posttraumatic and post ischemic encephalomalacia.
3. VP shunt with stable ventricular volume.

## 2019-06-21 LAB — CULTURE, BLOOD (ROUTINE X 2)
Culture: NO GROWTH
Culture: NO GROWTH
Special Requests: ADEQUATE
Special Requests: ADEQUATE
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
## 2019-06-28 ENCOUNTER — Telehealth: Payer: Self-pay

## 2019-06-28 NOTE — Telephone Encounter (Signed)
06/28/19: Received DC from [employer]. Sending to Dr. Elsworth Soho on 71M for signature.  06/30/19 Received signed CD. Faxed copy to [employer] and called them to pick up.

## 2019-06-29 NOTE — Plan of Care (Signed)
Have attempted to call surgeon on call x3. Have not been able to establish contact. Have contacted Surgeon at [facility] who will attempt to help contact on call provider. Will intubate as patient still tachypneic and underlying process unlikely to be resolved in the near future.   Newell Coral DO Internal Medicine/Pediatrics Pulmonary and Critical Care Fellow PGY-6

## 2019-06-29 NOTE — Procedures (Signed)
Endotracheal Intubation Procedure Note  Indication for endotracheal intubation: airway compromise, impending airway compromise and respiratory failure. This was done as an emergent procedure. Daughter had already given verbal consent Airway Assessment: Mallampati Class: I (soft palate, uvula, fauces, and tonsillar pillars visible). Sedation: etomidate. Paralytic: rocuronium. Lidocaine: no. Atropine: no. Equipment: Macintosh 4 laryngoscope blade. Cricoid Pressure: yes. Number of attempts: 1. ETT location confirmed by by auscultation.  Miguel [name] 05/30/2019

## 2019-06-29 NOTE — Progress Notes (Signed)
VASCULAR LAB PRELIMINARY  PRELIMINARY  PRELIMINARY  PRELIMINARY  Bilateral lower extremity venous duplex completed.    Preliminary report:  See CV proc for preliminary results.  Huntley Demedeiros, RVT Jul 12, 2019, 12:35 PM

## 2019-06-29 NOTE — Procedures (Signed)
Central Venous Catheter Insertion Procedure Note AUGUST LONGEST [id number] [birth year]  Procedure: Insertion of Central Venous Catheter Indications: Assessment of intravascular volume  Procedure Details Consent: Risks of procedure as well as the alternatives and risks of each were explained to the (patient/caregiver).  Consent for procedure obtained. Time Out: Verified patient identification, verified procedure, site/side was marked, verified correct patient position, special equipment/implants available, medications/allergies/relevent history reviewed, required imaging and test results available.  Performed  Maximum sterile technique was used including antiseptics, gloves, gown, hand hygiene and mask. Skin prep: Chlorhexidine; local anesthetic administered A antimicrobial bonded/coated triple lumen catheter was placed in the left internal jugular vein using the Seldinger technique.  Evaluation Blood flow good Complications: No apparent complications Patient did tolerate procedure well. Chest X-Kevan ordered to verify placement.  CXR: pending.  Tyna Jaksch 2019-06-21, 3:54 AM

## 2019-06-29 NOTE — Consult Note (Signed)
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 coronary artery calcifications 1. Mediastinum/Nodes: No mediastinal, hilar, or axillary adenopathy. Lungs/Pleura: Moderate to advanced a centrilobular and paraseptal emphysema. 2.2 cm rounded nodule in the right lower lobe at the right lung base. Consolidation in the right lower lobe posteriorly concerning for pneumonia. No effusions. Musculoskeletal: Ventriculoperitoneal shunt catheter noted in the anterior chest wall. No acute bony abnormality. Review of the MIP images confirms the above findings.  CTA ABDOMEN AND PELVIS FINDINGS VASCULAR Aorta: Aorta is normal caliber. Moderate calcified and noncalcified plaque in the mid and distal aorta. No dissection. Celiac: Patent without evidence of aneurysm, dissection, vasculitis or significant stenosis. SMA: Patent without evidence of aneurysm, dissection, vasculitis or significant stenosis. Renals: Both renal arteries are patent without evidence of aneurysm, dissection, vasculitis, fibromuscular dysplasia or significant stenosis. IMA: Patent without evidence of aneurysm, dissection, vasculitis or significant stenosis. Inflow: Atherosclerosis.  No aneurysm or dissection. Veins: IVC filter in place.  No obvious venous abnormality. Review of the MIP images confirms the above findings. NON-VASCULAR Hepatobiliary: There is portal venous gas noted in the intrahepatic portal venous branches, main portal vein, and superior mesenteric vein. No focal hepatic abnormality. No biliary ductal dilatation. Gallbladder grossly unremarkable. Pancreas: No focal abnormality or ductal dilatation. Spleen: No focal abnormality.  Normal size. Adrenals/Urinary Tract: No adrenal abnormality. No focal renal abnormality. No stones or hydronephrosis. Urinary bladder is unremarkable. Stomach/Bowel: Extensive abnormally dilated small bowel loops in the abdomen and pelvis. Distal small bowel loops are decompressed suggesting small-bowel obstruction. Within the dilated small bowel loops, there is mottled appearance of material layering as well as mottled appearance along the wall concerning for pneumatosis, likely the source of the portal venous gas. Large bowel is decompressed. Stomach grossly unremarkable. Gastrostomy tube in place within the stomach. Lymphatic: No adenopathy. Reproductive: No visible focal abnormality. Other: No free fluid or free air. Musculoskeletal: No acute bony abnormality. Review of the MIP images confirms the above findings. IMPRESSION: Abnormal appearance of small bowel  loops in the abdomen and pelvis which are dilated. Areas of pneumatosis suspected. Findings are concerning for ischemic bowel. Distal small bowel is decompressed suggesting a component of partial small bowel obstruction as well. Associated extensive portal venous gas.  No free air. No evidence of aortic aneurysm or dissection. Aortic atherosclerosis. Coronary artery disease. Small pulmonary embolus within the left upper lobe. Coronary  artery disease. Moderate to advanced emphysema. 2.2 cm right lower lobe pulmonary nodule. Consolidation in the right lower lobe concerning for pneumonia. Critical Value/emergent results were called by telephone at the time of interpretation on 07/03/2019 at 1:55 am to [facility] , who verbally acknowledged these results. Electronically Signed   By: Charlett Nose M.D.   On: 2019-07-03 01:55    Review of Systems  Unable to perform ROS: Critical illness   Blood pressure (!) 84/61, pulse (!) 114, temperature 98.6 F (37 C), temperature source Rectal, resp. rate (!) 31, height 6' (1.829 m), weight 57.6 kg, SpO2 98 %. Physical Exam  Constitutional: He appears well-developed.  intubated  HENT:  Head: Normocephalic and atraumatic.  Eyes: Pupils are equal, round, and reactive to light. Conjunctivae and EOM are normal.  Neck: Normal range of motion. Neck supple.  Cardiovascular: Regular rhythm.  Respiratory: Effort normal. No respiratory distress.  GI: Soft. He exhibits no distension. There is no abdominal tenderness.  Musculoskeletal: Normal range of motion.  Skin: Skin is warm and dry.    Assessment/Plan: 68 year old male with concern for GI ischemia.  On exam today he is abdomen is soft and minimally distended.  There is no obvious signs of pain with exam but he is intubated and sedated.  It is my opinion that an exploratory laparotomy would do more harm than good currently.  Lactic acidosis is decreasing with IV fluids.  I will place his G-tube to gravity  drain.  Hopefully this will decompress his GI tract.  Consider broadening his antibiotics.  We will follow him closely.  Repeat lactic acid in 4 to 6 hours.  Vanita Panda Jul 03, 2019, 4:05 AM

## 2019-06-29 NOTE — Progress Notes (Addendum)
NAME:  Miguel [name], MRN:  [id number], DOB:  February 12, 1951, LOS: 1 ADMISSION DATE:  06/10/2019, CONSULTATION DATE:  06/15/09 REFERRING MD:  Dr. Orpah Melter, CHIEF COMPLAINT:  Work of breathing  Brief History   68 yo NHR with TBI / PEG , atrial fibrillation admitted with Fever and HHS. Noted to have COVID 3 weeks ago .  Work-up showed pneumatosis in the bowel suggesting small bowel ischemia and small PE left upper lobe  History of present illness   This is a 68 yo with history of seizure disorder, CVA, TBI with hydrocephalus sp shunt, A fib, HTN and etoh use who had presented  On 8/10 with dysphagia. At baseline patient unable to communicate except with yes and no. Patient had G tube placed for noted oral deficits. Patient presented this evening for increased SOB noted at [place] and decrease in O2 sats. Of note had just been transitioned off the COVID unit the day prior and usually they keep patients for at least 2 weeks on the COVID unit per the staff. Patient had not been placed on any medications specifically for COVID while in the nursing facility. They noted he had accessory muscle use and . Of note had tested positive for COVID 3 weeks previously.   In Ed wokrup notable for LA of 8.0, UA with elvated glucose and blood glucose of 1200. Corrected NA of around 175. Creatinine of 2.8. Previously creatninie had been around 0.92. CBC with leukocytosis of 13.5.  Trop 48  Past Medical History  CVA TBI A fib HTN etoh use Dysphagia sp PEG tube.   Significant Hospital Events   Patient admitted to hospital service on 11/18  Consults:  PCCM  Procedures:  NA  Significant Diagnostic Tests:   CT angio chest/abdomen/pelvis 11/18 >> advanced emphysema, 2.2 cm nodule in the right lower lobe, consolidation right lower lobe, small PE left upper lobe Dilated small bowel loops, pneumatosis, portal venous gas  Head CT 11/18 old bilateral frontal infarct with encephalomalacia  Micro  Data:  UCX 11/18>> BCX 11/18>> SARS COV2->> POS  Antimicrobials:  Vanc 11/18  >> Meropenem 11/18 >>  Interim history/subjective:    Critically ill, intubated On pressors Levophed and vasopressin added due to persistent hypotension Last fever documented 4 PM yesterday  Objective   Blood pressure (!) 114/43, pulse (!) 107, temperature 98.6 F (37 C), temperature source Rectal, resp. rate (!) 30, height 6' (1.829 m), weight 57.6 kg, SpO2 100 %.    Vent Mode: PRVC FiO2 (%):  [100 %] 100 % Set Rate:  [18 bmp-22 bmp] 22 bmp Vt Set:  [460 mL-620 mL] 460 mL PEEP:  [5 cmH20] 5 cmH20 Plateau Pressure:  [13 cmH20] 13 cmH20   Intake/Output Summary (Last 24 hours) at 2019/07/13 1003 Last data filed at 07/13/19 0908 Gross per 24 hour  Intake 4626.36 ml  Output 180 ml  Net 4446.36 ml   Filed Weights   06/04/2019 2202 07-13-2019 0231  Weight: 57.6 kg 57.6 kg    Examination: General:  chronically ill appearing, intubated HENT: Dry mucous membranes Lungs: Diminshed breath sounds bilaterally Cardiovascular: S1-S2 irregular Abdomen: G tube in place.  Dark output, diffuse tenderness, soft nondistended Extremities: atrophied and cooler to touch in legs Neuro: Moves right side to pain but not the left, eyes open but does not follow commands GU: Foley   Chest x-Thedore 11/19 personally reviewed no new infiltrates, ET tube in position right lower lobe nodule noted before   Resolved Hospital Problem  list   NA  Assessment & Plan:   Current issues appear to be septic shock from small bowel ischemia likely embolic due to atrial fibrillation not on anticoagulation .  Not sure if small PE is playing any role  Shock, likely septic-due to small bowel ischemia  echo on 11/2018 with EF 65% some impaired relaxation  -Add vasopressin to Levophed -Place arterial line -Follow lactate, decreasing, every 8 hours -Broad-spectrum antibiotics meropenem and vancomycin    HHS- Previous A1c with pre  diabetic state at 5.9. No insulin at home per chart review.  Likely triggered by acute event --Insulin drip per endo tool -Continue D5 half-normal saline , do not want osmolality to come down quickly  Acute hypoxic respiratory failure -Small PE but doubt that this is driving hemodynamic instability -Heparin IV --Repeat echo for completion and venous duplex  Small bowel ischemia -surgery following, they felt he was not a candidate for exploratory laparotomy overnight, will have them reassess, continue G tube to LIS  COVID-History of. Patient had been positive at least weeks ago. Had just come off the COVID unit the day prior and they keep patients on this for at least 2 weeks.  -Precautions as noted previously  -Does not need steroids since no significant infiltrates -Out of window for remdesivir  AKI -likely ATN  continue IV fluids    Hypernatremia-Corrected NA over 170 on admission. 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 On duonebs at facility -Continue duonebs PRN. No wheezing. Do not think this is exacerbation at this time.    Summary-seems like small bowel ischemia is likely driving all of this related to embolic event, guarded prognosis without surgery Discussed with stepdaughter [name], she was willing to  defer medical decisions to was, "if it is his time God will take him", agreed to no CPR no cardioversion, limited CODE STATUS issued    Best practice:  Diet: NPO Pain/Anxiety/Delirium protocol (if indicated): NA VAP protocol (if indicated): NA DVT prophylaxis: Heparin sub q for now. Likely to start drip later GI prophylaxis: NA Glucose control: endo tool Mobility: NA Code Status: Full code Family Communication: step-  daughter [name] Disposition: ICU   The patient is critically ill with multiple organ systems failure and requires high complexity decision making for assessment and support, frequent evaluation and titration of therapies, application of advanced monitoring technologies and extensive interpretation of multiple databases. Critical Care Time devoted to patient care services described in this note independent of APP/resident  time and procedures is 45 minutes.   Kara Mead MD. [place]. [facility]  If no response to pager , please call [phone]   June 22, 2019

## 2019-06-29 NOTE — Progress Notes (Addendum)
Patient expired at 12:22. Pronounced by myself and Nelva Bush RN. Dr Elsworth Soho also outside of the room and aware of patients passing. Fentanyl 240 cc wasted in sink. I spoke with the daughter-in-law and updated her on patients time of death. [facility] notified [employer].

## 2019-06-29 NOTE — Progress Notes (Signed)
Spoke with  Step-daughter Cresenciano Lick and answered her questions. Dr Elsworth Soho also spoke with her and updated cide status. We are trying to connect in room with [facility] so she can see patient.

## 2019-06-29 NOTE — Progress Notes (Signed)
MD verbal OK to pull labs off of IV until central line can be placed due to poor vasculature. Lab made aware.

## 2019-06-29 NOTE — Progress Notes (Signed)
Two RTs attempted twice but was unsuccessful in placing arterial line.  RN aware.

## 2019-06-29 NOTE — Discharge Summary (Signed)
NAME:  Miguel [name], MRN:  [id number], DOB:  [birth year], LOS: 1 ADMISSION DATE:  06/27/2019, CONSULTATION DATE:  06/15/09 REFERRING MD:  Dr. Orpah Melter, CHIEF COMPLAINT:  Work of breathing  Brief History   68 yo NHR with TBI / PEG , atrial fibrillation admitted with Fever and HHS. Noted to have COVID 3 weeks ago .  Work-up showed pneumatosis in the bowel suggesting small bowel ischemia and small PE left upper lobe  History of present illness   This is a 68 yo with history of seizure disorder, CVA, TBI with hydrocephalus sp shunt, A fib, HTN and etoh use who had presented  On 8/10 with dysphagia. At baseline patient unable to communicate except with yes and no. Patient had G tube placed for noted oral deficits. Patient presented this evening for increased SOB noted at [place] and decrease in O2 sats. Of note had just been transitioned off the COVID unit the day prior and usually they keep patients for at least 2 weeks on the COVID unit per the staff. Patient had not been placed on any medications specifically for COVID while in the nursing facility. They noted he had accessory muscle use and . Of note had tested positive for COVID 3 weeks previously.   In Ed wokrup notable for LA of 8.0, UA with elvated glucose and blood glucose of 1200. Corrected NA of around 175. Creatinine of 2.8. Previously creatninie had been around 0.92. CBC with leukocytosis of 13.5.  Trop 48  Past Medical History  CVA TBI A fib HTN etoh use Dysphagia sp PEG tube.   Significant Hospital Events   Patient admitted to hospital service on 11/18  Consults:  PCCM  Procedures:  NA  Significant Diagnostic Tests:   CT angio chest/abdomen/pelvis 11/18 >> advanced emphysema, 2.2 cm nodule in the right lower lobe, consolidation right lower lobe, small PE left upper lobe Dilated small bowel loops, pneumatosis, portal venous gas  Head CT 11/18 old bilateral frontal infarct with encephalomalacia  Micro  Data:  UCX 11/18>>ng BCX 11/18>>ng 11/19 resp >> nml flora SARS COV2->> POS  Antimicrobials:  Vanc 11/18  >> Meropenem 11/18 >>   Assessment & Plan:   Current issues appear to be septic shock from small bowel ischemia likely embolic due to atrial fibrillation not on anticoagulation .  Not sure if small PE is playing any role  Shock, likely septic-due to small bowel ischemia  echo on 11/2018 with EF 65% some impaired relaxation  -Added vasopressin to Levophed -Placed arterial line -Follow lactate, decreasing, every 8 hours -Broad-spectrum antibiotics meropenem and vancomycin    HHS- Previous A1c with pre diabetic state at 5.9. No insulin at home per chart review.  Likely triggered by acute event --Insulin drip per endo tool -Continue D5 half-normal saline , do not want osmolality to come down quickly  Acute hypoxic respiratory failure -Small PE but doubt that this is driving hemodynamic instability -Heparin IV --Repeat echo for completion and venous duplex  Small bowel ischemia -surgery following, they felt he was not a candidate for exploratory laparotomy overnight, will have them reassess, continue G tube to LIS  COVID-History of. Patient had been positive at least weeks ago. Had just come off the COVID unit the day prior and they keep patients on this for at least 2 weeks.  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 On duonebs at facility -Continue duonebs PRN. No wheezing. Do not think this is exacerbation at this time.    COURSE --seems like small bowel ischemia is likely driving all of this related to embolic event, guarded prognosis without surgery Discussed with stepdaughter [name], she was willing to defer medical decisions to was, "if it is his time God will take him", agreed to no CPR no cardioversion, limited CODE STATUS issued.  Evaluated by general surgeon Dr. Daphine Deutscher but he was hypotensive in spite of pressors, not considered to be a surgical candidate.  He passed away soon after  Cause of death-septic shock, ischemic bowel , COVID-19 positive test (U07.1, COVID-19) with Acute Pneumonia (J12.89, Other viral pneumonia)  CoMorbidities-CVA, dysphagia  Rakesh V. Vassie Loll

## 2019-06-29 NOTE — Progress Notes (Signed)
Echocardiogram 2D Echocardiogram has been performed.  Oneal Deputy Cathryn Gallery 07-03-2019, 8:54 AM

## 2019-06-29 NOTE — Progress Notes (Signed)
MD made aware that Endotool was not able to be adequately up to date with insulin drip as patient critical and spent a lot of time down in CT with this RN. Patient is also Covid positive, creating a barrier to receiving timely Q30 minute CBG checks. Blood sugars checked as able and insulin drip adjusted accordingly. Will continue to monitor.

## 2019-06-29 NOTE — Progress Notes (Signed)
[place] Progress Note Patient Name: Miguel [name] DOB: 12-Apr-1951 MRN: [id number]   Date of Service  06/18/2019  HPI/Events of Note  Septic shock  eICU Interventions  LR 1000 ml iv bolus now, Albumin 5 % 500 ml iv bolus x 1, Vasopressin infusion, continue Norepinephrine infusion        Okoronkwo U Ogan 06/11/2019, 7:08 AM

## 2019-06-29 NOTE — Progress Notes (Signed)
Pharmacy Antibiotic Note  Miguel [name] is a 68 y.o. 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 Tested + COVID ~ 3 weeks ago.  Pharmacy has been consulted for Vancomycin.  Surgery is changing Cefepime to Meropenem dosing. PMH also significant for TBI with hx seizures currently on Keppra-->both Cefepime & Meropenem have been associated with increased seizure risk.     06/26/2019:  Tm 101.78F  Increased WBC (16) & LA (4.1)  AKI- est CrCl ~ 51ml/min  Plan: Continue Vancomycin 1250 mg IV Q 48 hrs. Goal AUC 400-550. Meropenem 500mg  IV q12h Monitor renal function and cx data  Monitor for seizure activity  Height: 6' (182.9 cm) Weight: 126 lb 15.8 oz (57.6 kg) IBW/kg (Calculated) : 77.6  Temp (24hrs), Avg:100.1 F (37.8 C), Min:98.6 F (37 C), Max:101.6 F (38.7 C)  Recent Labs  Lab 06/23/2019 1710 2019/06/23 1910 June 23, 2019 2207 06/03/2019 0246 06/18/2019 0524  WBC 13.5*  --   --  16.0*  --   CREATININE 2.48*  --  2.57* 2.48*  --   LATICACIDVEN 6.2* 8.0*  --  5.4* 4.1*    Estimated Creatinine Clearance: 23.2 mL/min (A) (by C-G formula based on SCr of 2.48 mg/dL (H)).    No Known Allergies  Antimicrobials this admission: Vancomycin 06/15/2019 >> Cefepime 06/24/2019 >> 11/19 Meropenem 11/19>>   Dose adjustments this admission:  Microbiology results: 11/18 BCx:  11/18 UCx:  11/18: MRSA PCR: negative 11/18 COVID +  Thank you for allowing pharmacy to be a part of this patient's care.  Netta Cedars, PharmD, BCPS 05/31/2019 7:40 AM

## 2019-06-29 NOTE — Progress Notes (Signed)
Pharmacy Antibiotic Note  Miguel [name] is a 68 y.o. male admitted on 06/04/2019 with pneumonia.  Pharmacy has been consulted for Vancomycin, cefepime dosing.  Plan: Vancomycin 1.25 gm iv x1, then Vancomycin 1250 mg IV Q 48 hrs. Goal AUC 400-550. Expected AUC: 496 SCr used: 2.48  Cefepime 2gm iv x1, then 2gm iv q24hr   Height: 6' (182.9 cm) Weight: 126 lb 15.8 oz (57.6 kg) IBW/kg (Calculated) : 77.6  Temp (24hrs), Avg:100.1 F (37.8 C), Min:98.6 F (37 C), Max:101.6 F (38.7 C)  Recent Labs  Lab 06/28/2019 1710 06/12/2019 1910 05/31/2019 2207 Jun 20, 2019 0246  WBC 13.5*  --   --  16.0*  CREATININE 2.48*  --  2.57* 2.48*  LATICACIDVEN 6.2* 8.0*  --  5.4*    Estimated Creatinine Clearance: 23.2 mL/min (A) (by C-G formula based on SCr of 2.48 mg/dL (H)).    No Known Allergies  Antimicrobials this admission: Vancomycin 2019/06/20 >> Cefepime 06-20-2019 >>    Dose adjustments this admission: -  Microbiology results: -  Thank you for allowing pharmacy to be a part of this patient's care.  Nani Skillern Crowford 06/20/2019 6:21 AM

## 2019-06-29 NOTE — Procedures (Signed)
Arterial Catheter Insertion Procedure Note DARIOUS REHMAN [id number] [birth year]  Procedure: Insertion of Arterial Catheter  Indications: Blood pressure monitoring  Procedure Details Consent: Unable to obtain consent because of altered level of consciousness. Time Out: Verified patient identification, verified procedure, site/side was marked, verified correct patient position, special equipment/implants available, medications/allergies/relevent history reviewed, required imaging and test results available.  Performed  Maximum sterile technique was used including antiseptics, cap, gloves, gown, hand hygiene, mask and sheet. Skin prep: Chlorhexidine; local anesthetic administered 20 gauge catheter was inserted into right femoral artery using the Seldinger technique. ULTRASOUND GUIDANCE USED: YES Evaluation Blood flow good; BP tracing good. Complications: No apparent complications.   Leanna Sato Elsworth Soho 06/08/2019

## 2019-06-29 NOTE — Progress Notes (Signed)
Patient's PEG tube placed to gravity drain per verbal order from surgeon.

## 2019-06-29 NOTE — Progress Notes (Signed)
ANTICOAGULATION CONSULT NOTE - Initial Consult  Pharmacy Consult for Heparin Indication: pulmonary embolus  No Known Allergies  Patient Measurements: Height: 6' (182.9 cm) Weight: 126 lb 15.8 oz (57.6 kg) IBW/kg (Calculated) : 77.6 Heparin Dosing Weight: actual weight  Vital Signs: Temp: 98.6 F (37 C) (11/18 2200) Temp Source: Rectal (11/18 2200) BP: 90/37 (11/19 0611) Pulse Rate: 109 (11/19 0345)  Labs: Recent Labs    06/15/2019 1710 06/04/2019 2207 07-Jul-2019 0246  HGB 16.0  --  15.2  HCT 54.7*  --  52.9*  PLT 309  --  261  CREATININE 2.48* 2.57* 2.48*  TROPONINIHS 48*  --   --     Estimated Creatinine Clearance: 23.2 mL/min (A) (by C-G formula based on SCr of 2.48 mg/dL (H)).   Medical History: Past Medical History:  Diagnosis Date  . Alcohol abuse   . Atrial fibrillation ([place])   . Closed head injury 05/2006   hx/notes 11/01/2009  . DVT (deep venous thrombosis) ([place]) 06/2006   Archie Endo 10/19/2009  . Heart murmur   . Hypertension   . 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 Seizures ([place])    Archie Endo 10/19/2009  . Stroke ([place]) 09/2009   w/right sided weakness/notes 10/31/2009    Medications:  Infusions:  . sodium chloride    . albumin human    . ceFEPime (MAXIPIME) IV    . dextrose 5 % and 0.45% NaCl Stopped (06/25/2019 2127)  . dextrose 5 % and 0.45% NaCl 75 mL/hr at 07-07-2019 0606  . fentaNYL infusion INTRAVENOUS Stopped (07-Jul-2019 4481)  . insulin    . lactated ringers    . levETIRAcetam Stopped (2019-07-07 0636)  . norepinephrine Stopped (07/07/19 0453)  . norepinephrine (LEVOPHED) Adult infusion 20 mcg/min (Jul 07, 2019 0453)  . potassium chloride 10 mEq (07/07/2019 0651)  .  sodium bicarbonate (isotonic) infusion in sterile water 75 mL/hr at 07-Jul-2019 0457  . sodium chloride    . sodium chloride Stopped (07/07/2019 8563)  . [START ON 06/18/2019] vancomycin    . vasopressin (PITRESSIN) infusion - *FOR SHOCK*      Assessment: 68 yo M with hx  Afib, CVA, DVT and recent COVID PNA presents with shortness of breath.  Chest CT + PE.  Not on anticoagulation PTA however he did receive sq heparin 5000 units at 0155. Baseline CBC WNL.  Baseline INR, aPTT pending. Patient with AKI and increased LFTs on admission.   Goal of Therapy:  Heparin level 0.3-0.7 units/ml Monitor platelets by anticoagulation protocol: Yes   Plan:  D/C SQ Heparin Give 2000 units bolus x 1 Start heparin infusion at 1200 units/hr Check anti-Xa level in 8 hours and daily while on heparin Continue to monitor H&H and platelets  Netta Cedars, PharmD, BCPS 07-07-2019,7:20 AM

## 2019-06-29 NOTE — Progress Notes (Signed)
CC: Short of breath/altered mental status  Subjective: Called by Dr. Elsworth Soho.  Patient has diffuse abdominal tenderness.  Even after fentanyl he still tender on exam.  No bowel sounds.  Drainage from the gastrostomy tube is bloody.  Objective: Vital signs in last 24 hours: Temp:  [98.6 F (37 C)-101.6 F (38.7 C)] 98.6 F (37 C) (11/18 2200) Pulse Rate:  [28-124] 109 (11/19 0345) Resp:  [22-48] 22 (11/19 0611) BP: (75-170)/(37-100) 90/37 (11/19 0611) SpO2:  [72 %-100 %] 98 % (11/19 0515) FiO2 (%):  [100 %] 100 % (11/19 0515) Weight:  [57.6 kg] 57.6 kg (11/19 0231) Last BM Date: (Prior to admission, unable to assess) 1765 IV yesterday; 2860 so far this AM. Urine 150 recorded yesterday Drain 30 T-max 101.6 on admission; second temperature yesterday 98.6 Respiratory rate currently between 25-30. Blood pressure 68/54 >> 114/43 Heart rate 110-120 range Admission glucose 476 pH 7.21 PCO2 62.8 PO2 53 Bicarb 24.2 NA 172 K+ 4.5 Chloride > 130 Creatinine 2.5 Calcium 7.5 INR 1.5 Intake/Output from previous day: 11/18 0701 - 11/19 0700 In: 1388.3 [I.V.:377.4; IV Piggyback:1011] Out: -  Intake/Output this shift: Total I/O In: 1388.3 [I.V.:377.4; IV Piggyback:1011] Out: -   General appearance: Patient is sedated on the ventilator.  They are prepping him for a femoral A-line. Resp: Patient with adequate saturations intubated, and on the ventilator. Cardio: Tachycardic GI: Abdomen is distended.  He is diffusely tender on palpation even with fentanyl and some sedation on board.  No bowel sounds.  Gastrostomy tube in place for dysphagia.  Drainage from the gastrostomy tube is bloody appearing  Lab Results:  Recent Labs    06/15/2019 1710 07-06-2019 0246  WBC 13.5* 16.0*  HGB 16.0 15.2  HCT 54.7* 52.9*  PLT 309 261    BMET Recent Labs    06/13/2019 2207 07-06-2019 0246  NA 159* 160*  K 4.5 3.2*  CL 125* 130*  CO2 17* 16*  GLUCOSE 982* 476*  BUN 71* 67*  CREATININE  2.57* 2.48*  CALCIUM 8.4* 7.9*   PT/INR No results for input(s): LABPROT, INR in the last 72 hours.  Recent Labs  Lab 06/15/2019 1710  AST 63*  ALT 163*  ALKPHOS 76  BILITOT 1.0  PROT 7.3  ALBUMIN 2.9*     Lipase  No results found for: LIPASE   Medications: . chlorhexidine gluconate (MEDLINE KIT)  15 mL Mouth Rinse BID  . Chlorhexidine Gluconate Cloth  6 each Topical Daily  . famotidine  20 mg Per Tube BID  . fentaNYL      . heparin  5,000 Units Subcutaneous Q8H  . mouth rinse  15 mL Mouth Rinse q12n4p  . mouth rinse  15 mL Mouth Rinse q12n4p  . sodium chloride (PF)      . sodium chloride flush  10-40 mL Intracatheter Q12H   . sodium chloride    . ceFEPime (MAXIPIME) IV    . dextrose 5 % and 0.45% NaCl Stopped (06/22/2019 2127)  . dextrose 5 % and 0.45% NaCl 75 mL/hr at 06-Jul-2019 0606  . fentaNYL infusion INTRAVENOUS Stopped (07/06/19 8403)  . insulin    . levETIRAcetam Stopped (2019-07-06 0636)  . norepinephrine Stopped (2019-07-06 0453)  . norepinephrine (LEVOPHED) Adult infusion 20 mcg/min (06-Jul-2019 0453)  . potassium chloride 10 mEq (07-06-19 0651)  .  sodium bicarbonate (isotonic) infusion in sterile water 75 mL/hr at 2019/07/06 0457  . sodium chloride    . sodium chloride Stopped (2019/07/06 7543)  . 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 ID: Cefepime 11/18 >> day 2: vancomycin 11/19 >> day 1 DVT: Heparin Follow-up: TBD   Plan: Patient is acutely ill with possible GI ischemia.  He is tachycardic, tachypneic, on pressors for blood pressure support.  Acidotic this a.m. 0621 hrs.  Lactate still elevated. Hyponatremic.  Dr. Hassell Done will discuss with Dr. Elsworth Soho as soon as he gets out of the OR.    LOS: 1 day    Alayza Pieper 2019-07-02 Please see Amion

## 2019-06-29 NOTE — Progress Notes (Signed)
Initial Nutrition Assessment  DOCUMENTATION CODES:   Underweight, suspect some degree of malnutrition but RD is unable to confirm without NFPE  INTERVENTION:   Tube feeding recommendations when medically able: - Vital AF 1.2 @ 60 ml/hr (1440 ml/day) via G-tube  Tube feeding regimen provides 1728 kcal, 108 grams of protein, and 1168 ml of H2O.   NUTRITION DIAGNOSIS:   Inadequate oral intake related to inability to eat as evidenced by NPO status.  GOAL:   Patient will meet greater than or equal to 90% of their needs  MONITOR:   Vent status, Labs, Weight trends, I & O's  REASON FOR ASSESSMENT:   Ventilator    ASSESSMENT:   68 year old male who presented to the ED on 11/18 with SOB. Noted to have COVID-19 3 weeks ago. PMH of seizure disorder, CVA, TBI with hydrocephalus s/p shunt, atrial fibrillation, HTN, EtOH abuse. Pt presented on 8/10 with dysphagia and had G-tube placed. Pt is COVID-19 positive on admission. Pt admitted with sepsis.   11/19 - intubated  G-tube is to gravity drain at this time per Surgery due to concern for GI ischemia. Drainage appearance recorded as dark red, coffee ground.  RD will leave tube feeding recommendations for use once medically appropriate.  Reviewed weight history in chart. Pt with steady decline in weight over the last 6 months. Pt with a 14 kg total weight loss since 12/15/18. This is a 19.6% weight loss in 6 months which is severe and significant for timeframe. Suspect pt with some degree of malnutrition but RD unable to confirm without NFPE.  Patient is currently intubated on ventilator support MV: 10.7 L/min Temp (24hrs), Avg:100.1 F (37.8 C), Min:98.6 F (37 C), Max:101.6 F (38.7 C) BP(cuff): 149/126 MAP (cuff): 132  Drips: D5 in 1/2NS: 75 ml/hr Heparin: 12 ml/hr Levophed: 150 ml/hr Vasopressin: 11.2 ml/hr  Medications reviewed and include: Pepcid, IV Keppra, IV abx  Labs reviewed: sodium 172, chloride >130, BUN 66,  creatinine 2.50, magnesium 3.0 CBG's: 198-588 x 24 hours (trending down)  NUTRITION - FOCUSED PHYSICAL EXAM:  Unable to complete at this time.  Diet Order:   Diet Order            Diet NPO time specified  Diet effective now              EDUCATION NEEDS:   No education needs have been identified at this time  Skin:  Skin Assessment: Reviewed RN Assessment  Last BM:  no documented BM  Height:   Ht Readings from Last 1 Encounters:  06/02/2019 6' (1.829 m)    Weight:   Wt Readings from Last 1 Encounters:  06/06/2019 57.6 kg    Ideal Body Weight:  80.9 kg  BMI:  Body mass index is 17.22 kg/m.  Estimated Nutritional Needs:   Kcal:  6578  Protein:  90-110 grams  Fluid:  >/= 1.7 L    Gaynell Face, MS, RD, LDN Inpatient Clinical Dietitian Pager: [phone] Weekend/After Hours: [phone]

## 2019-06-29 DEATH — deceased

## 2020-01-01 IMAGING — CT CT HEAD CODE STROKE W/O CM
3 series · 14 of 47 positions shown, 16 images · non-contrast
Comparison: Prior CT from 06/17/2018.

CLINICAL DATA: Code stroke. Initial evaluation for acute left-sided
deficits.

EXAM:
CT HEAD WITHOUT CONTRAST
TECHNIQUE: Contiguous axial images were obtained from the base of the skull
through the vertex without intravenous contrast.

[Series 2: head 5.0 st · axial · 0.46mm/px · z∈[-110,+40]mm · 8 of 36 slices shown, 10 images]
[im 3/36  brain]
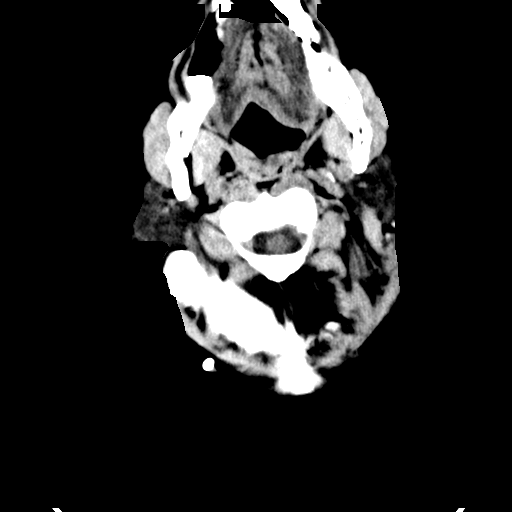
[im 3/36  bone]
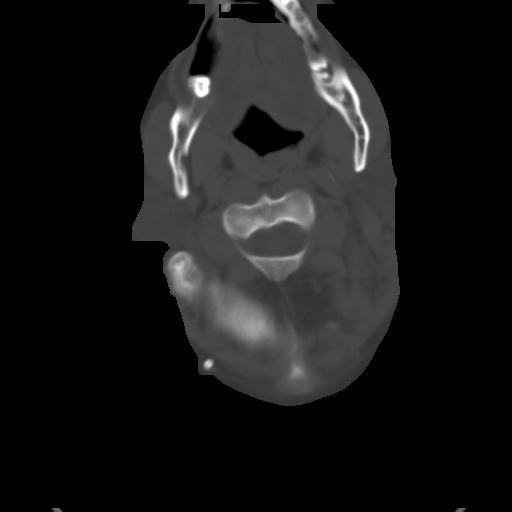
[im 8/36  brain]
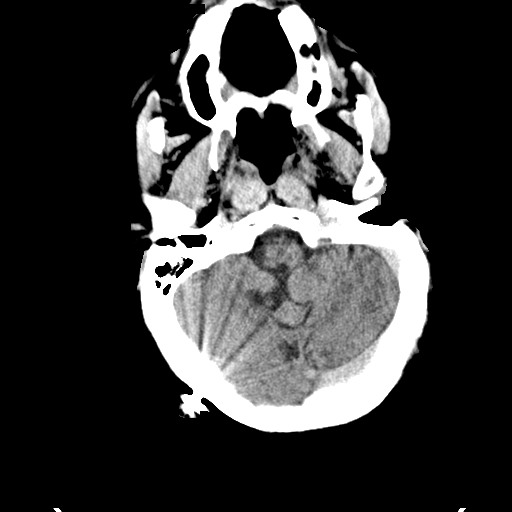
[im 11/36  brain]
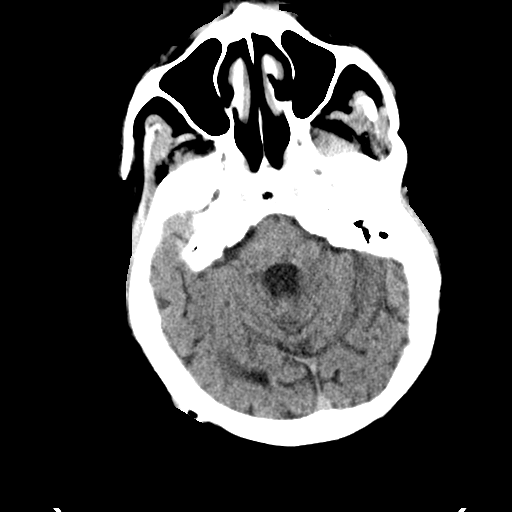
[im 16/36  brain]
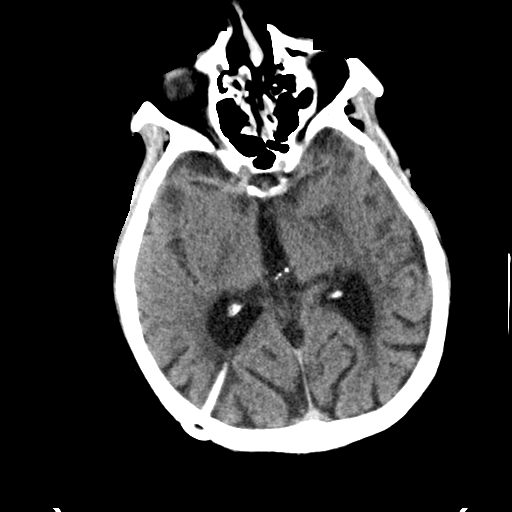
[im 20/36  brain]
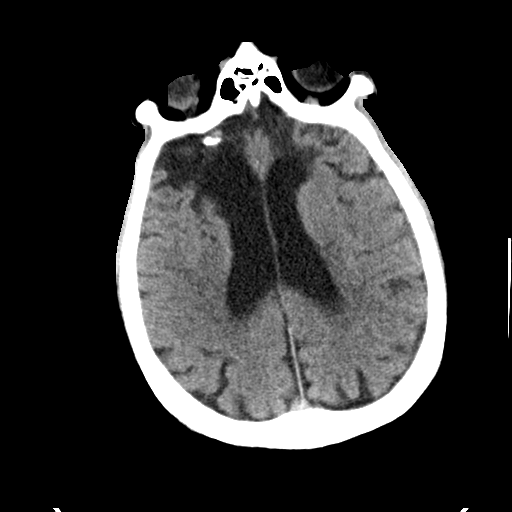
[im 20/36  bone]
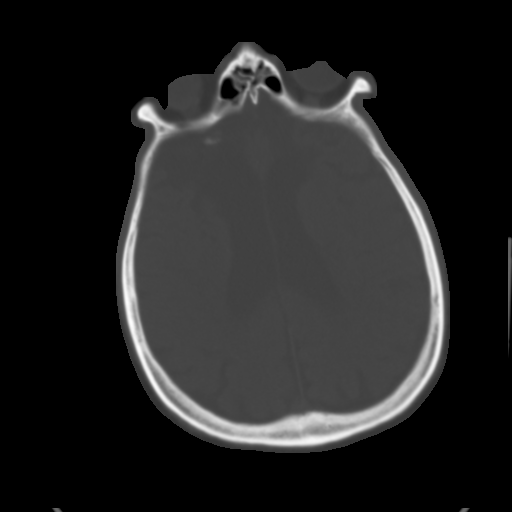
[im 25/36  brain]
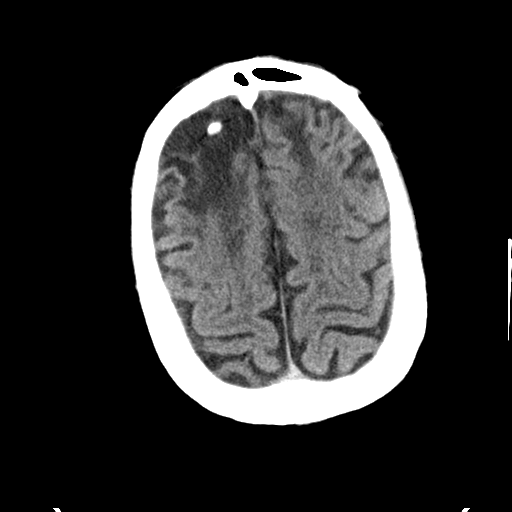
[im 28/36  brain]
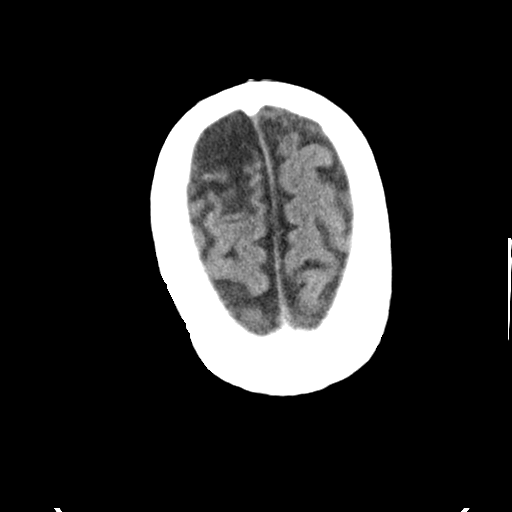
[im 33/36  brain]
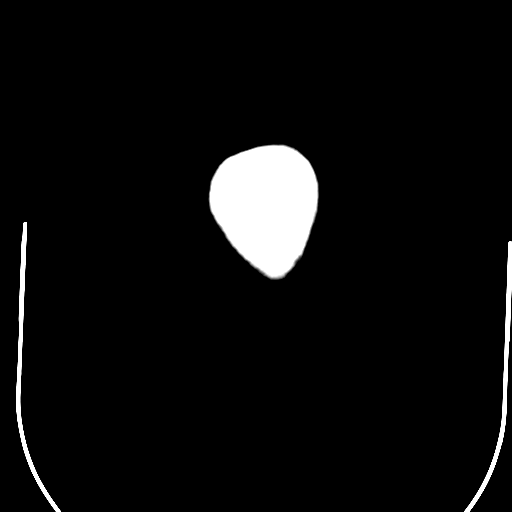

[Series 4: head 3.0 cor st · coronal · 0.35mm/px · 3 of 73 slices shown]
[im 25/73  brain]
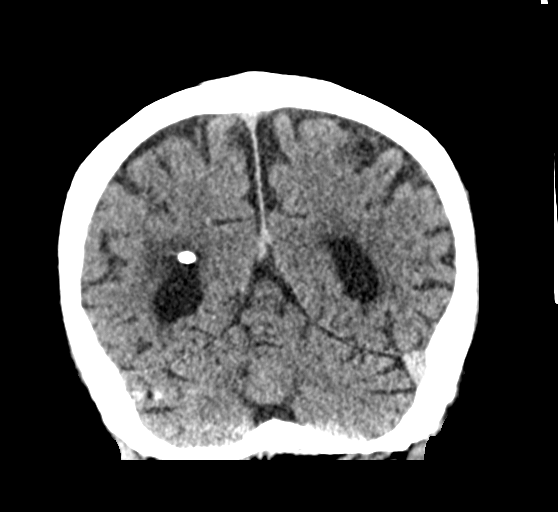
[im 33/73  brain]
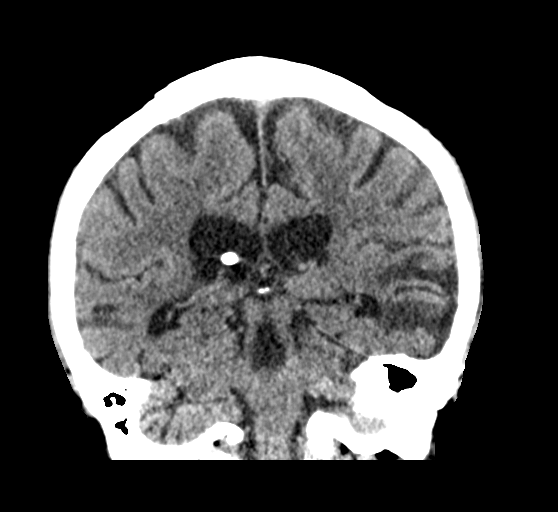
[im 41/73  brain]
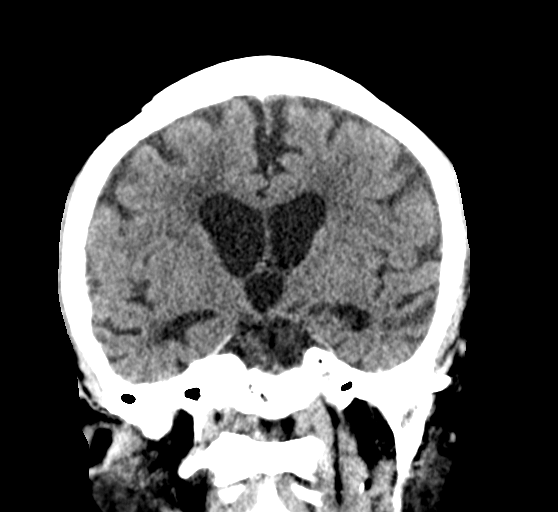

[Series 5: head 3.0 sag st · sagittal · 0.35mm/px · 3 of 62 slices shown]
[im 21/62  brain]
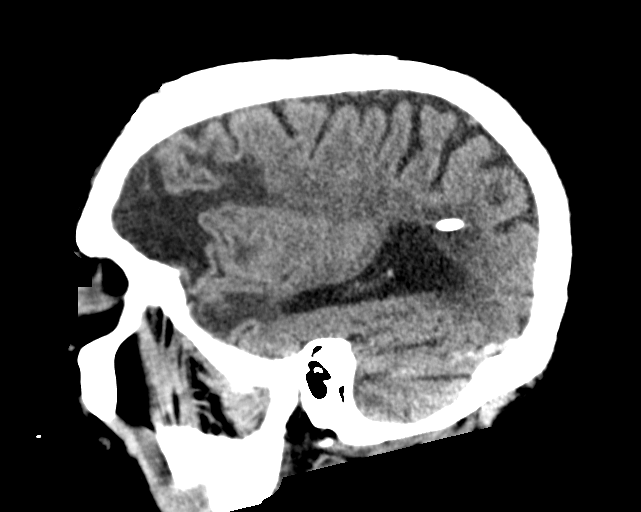
[im 31/62  brain]
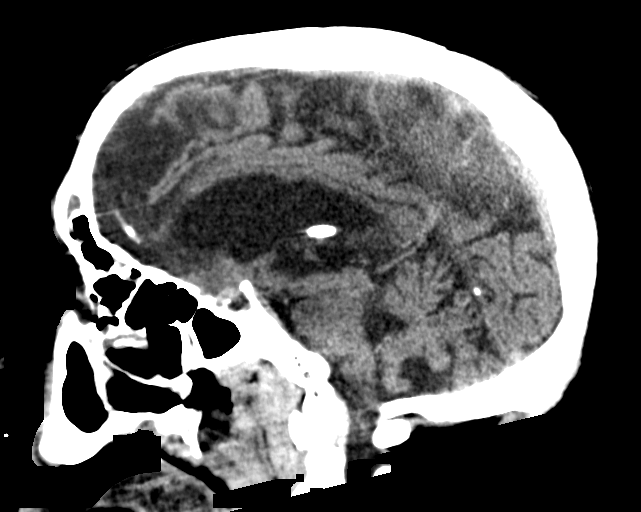
[im 41/62  brain]
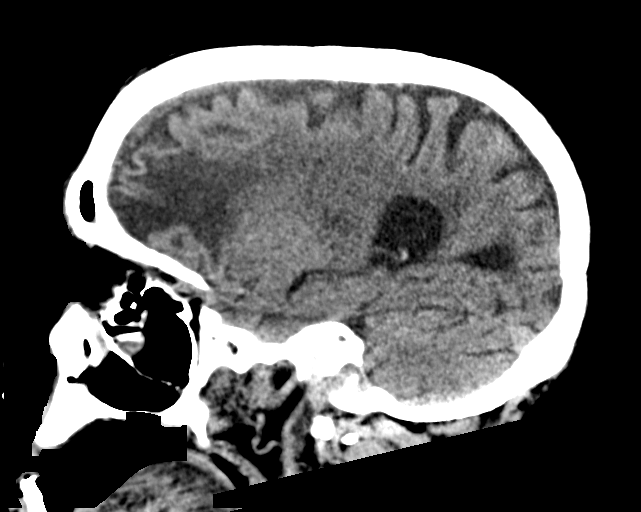

[14 of 47 positions shown; findings below may reference images not displayed]

FINDINGS: Brain: Advanced age-related cerebral atrophy with moderate chronic
microvascular ischemic disease. Superimposed extensive bifrontal
encephalomalacia, right greater than left with superimposed right
frontal dystrophic calcification, stable. Associated ex vacuo
dilatation of the frontal horns of both lateral ventricles, also
greater on the right. Right parietal approach shunt catheter in
place with tip terminating at the septum pellucidum, stable.
Ventricular size and morphology unchanged. Left temporal lobe
encephalomalacia of compatible with chronic ischemia, stable. Few
scattered chronic lacunar infarcts noted involving the right basal
ganglia and left thalamus. Small remote right cerebellar infarct.

No acute intracranial hemorrhage. No acute large vessel territory
infarct. No mass lesion, midline shift or mass effect. No
hydrocephalus. No extra-axial fluid collection.

Vascular: No asymmetric hyperdense vessel. Scattered vascular
calcifications noted within the carotid siphons.

Skull: Right parietal approach VP shunt catheter. Scalp soft tissues
demonstrate no acute finding. Calvarium otherwise intact.

Sinuses/Orbits: Globes and orbital soft tissues demonstrate no acute
finding. Chronic mucosal thickening seen throughout the paranasal
sinuses. Mastoids are clear.

Other: None.

ASPECTS ([place]berta Stroke Program Early CT Score)

- Ganglionic level infarction (caudate, lentiform nuclei, internal
capsule, insula, M1-M3 cortex): 7

- Supraganglionic infarction (M4-M6 cortex): 3

Total score (0-10 with 10 being normal): 10
IMPRESSION: 1. No acute intracranial infarct or other abnormality.
2. ASPECTS is 10.
3. Extensive right greater than left bifrontal encephalomalacia with
multiple remote ischemic infarcts as above.
4. Right parietal approach VP shunt catheter in place with stable
ventricular size and morphology.
5. Underlying cerebral atrophy with chronic microvascular ischemic
disease.

These results were communicated to Eugenio Alejandro at [DATE] pmon 12/15/2018by
text page via the AMION messaging system.

## 2020-01-01 IMAGING — DX PORTABLE CHEST - 1 VIEW
1 series · 1 of 1 positions shown · non-contrast
Comparison: 03/19/2017

CLINICAL DATA: Code stroke.

EXAM:
PORTABLE CHEST 1 VIEW

[chest]
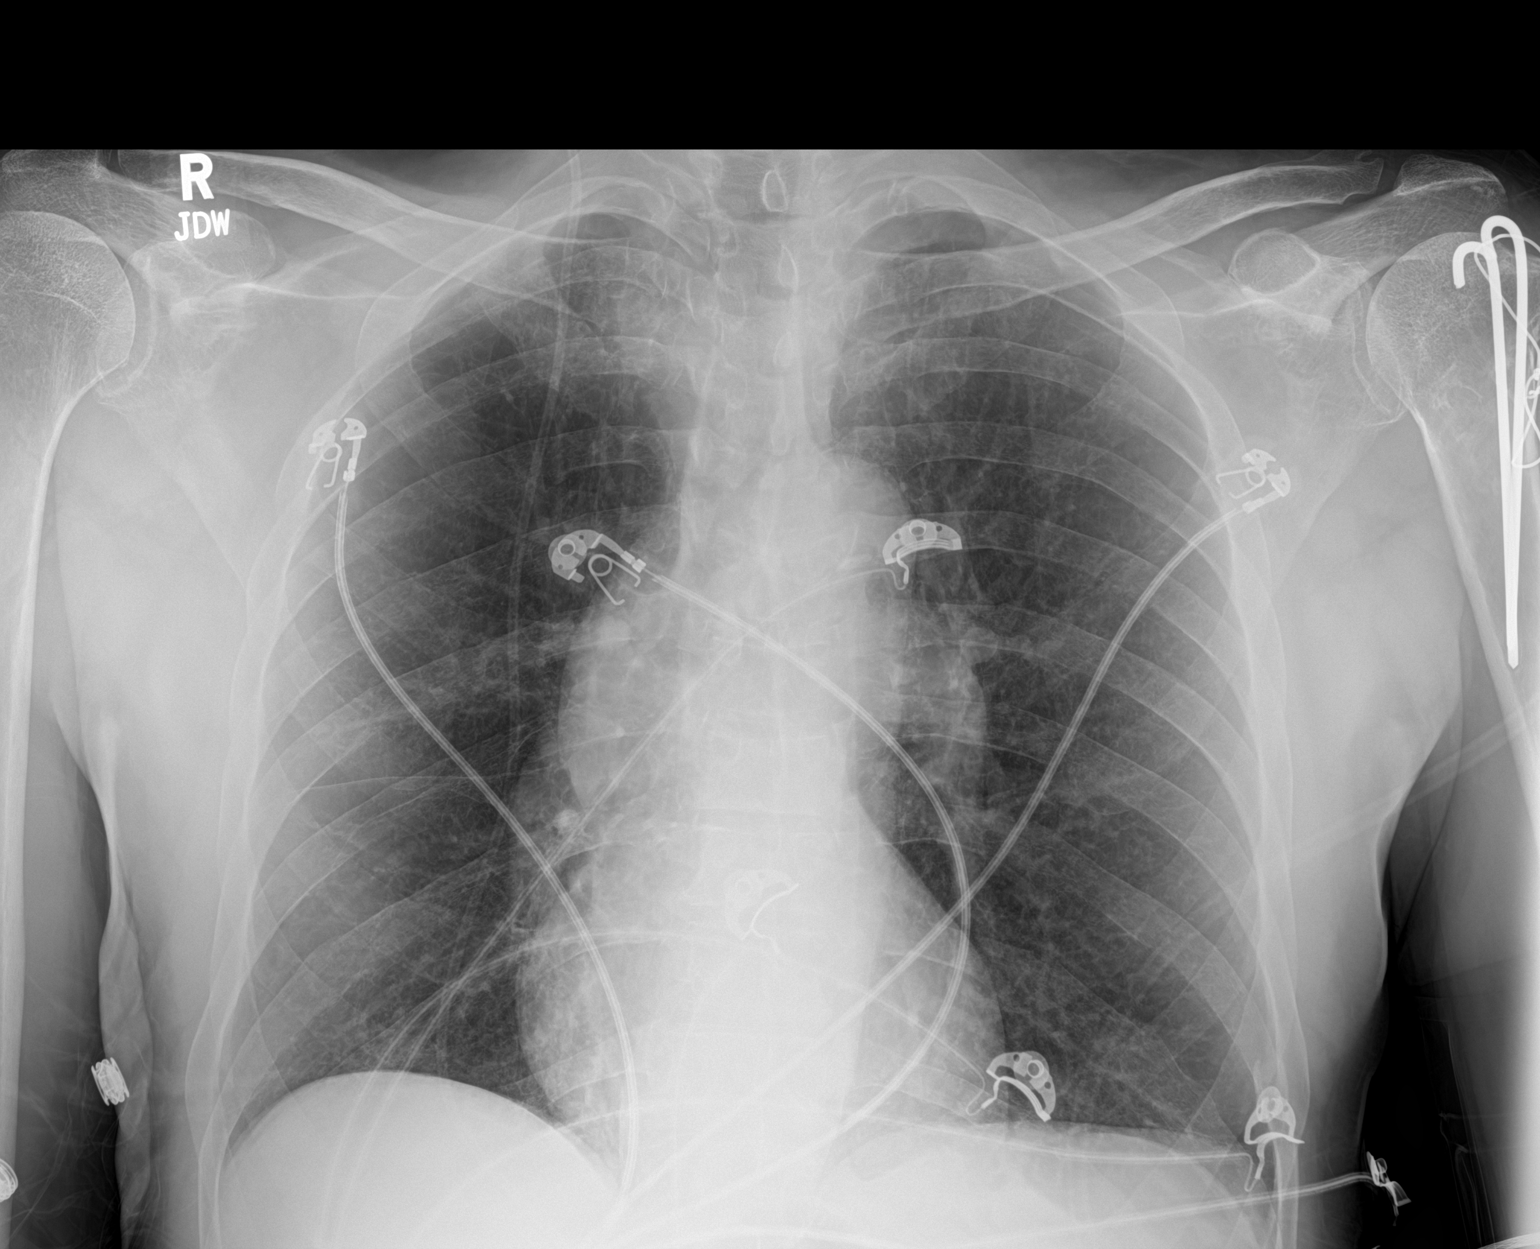

[1 of 1 positions shown; findings below may reference images not displayed]

FINDINGS: The lungs are clear without focal pneumonia, edema, pneumothorax or
pleural effusion. The cardiopericardial silhouette is within normal
limits for size. The visualized bony structures of the thorax are
intact. Telemetry leads overlie the chest. Shunt tubing over the
right hemithorax again noted.
IMPRESSION: No active disease.

## 2020-01-01 IMAGING — CT CT ANGIOGRAPHY HEAD
2 of 7 series · 8 of 33 positions shown · IV contrast (OMNI 350)
Comparison: CT head 12/15/2018

CLINICAL DATA: Code stroke.  Left-sided weakness.

EXAM:
CT ANGIOGRAPHY HEAD AND NECK
TECHNIQUE: Multidetector CT imaging of the head and neck was performed using
the standard protocol during bolus administration of intravenous
contrast. Multiplanar CT image reconstructions and MIPs were
obtained to evaluate the vascular anatomy. Carotid stenosis
measurements (when applicable) are obtained utilizing NASCET
criteria, using the distal internal carotid diameter as the
denominator.
CONTRAST:  75mL OMNIPAQUE IOHEXOL 350 MG/ML SOLN

[Series 5: cta neck · axial · 0.46mm/px · z∈[-188,-70]mm · 2 of 179 slices shown]
[im 60/179  soft-tissue]
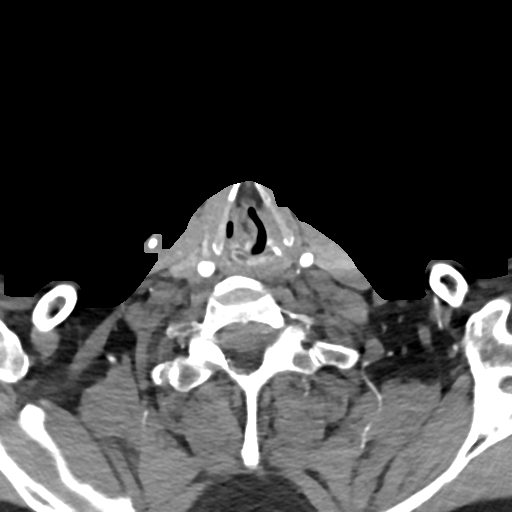
[im 119/179  soft-tissue]
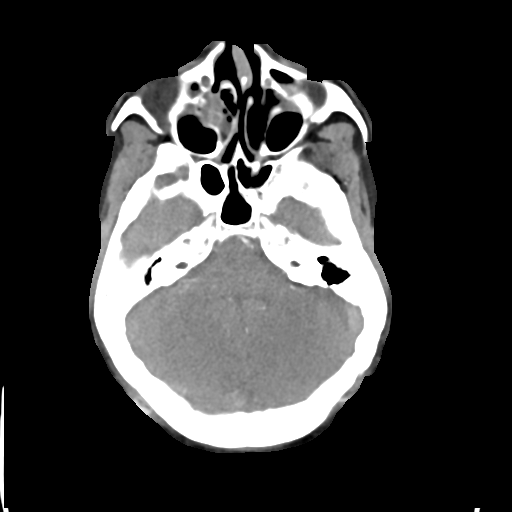

[Series 7: cta neck axial · axial · 0.39mm/px · z∈[-254,+1]mm · 6 of 357 slices shown]
[im 51/357  soft-tissue]
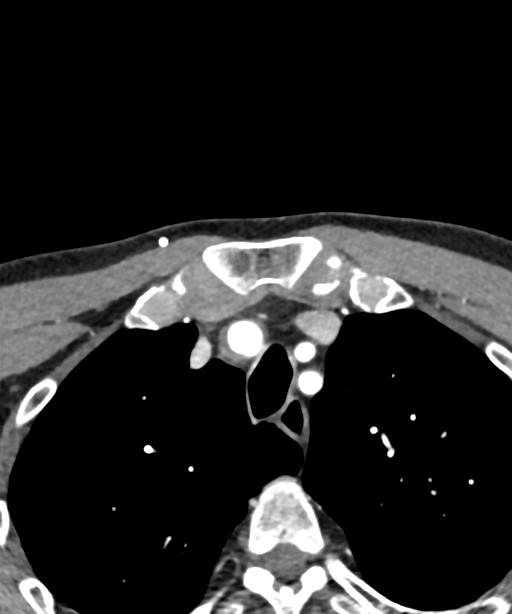
[im 102/357  bone]
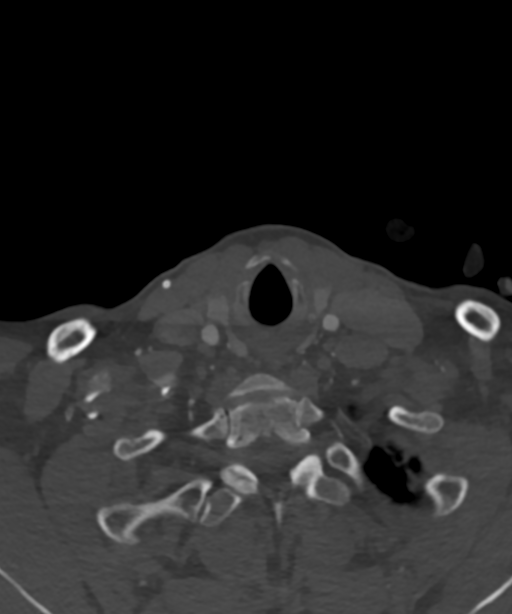
[im 153/357  soft-tissue]
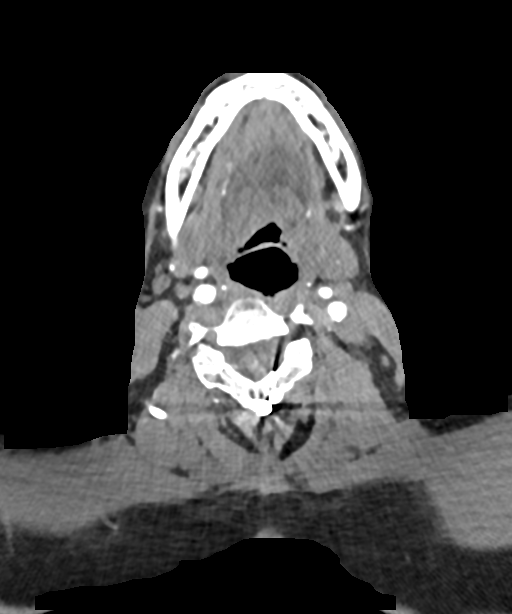
[im 204/357  bone]
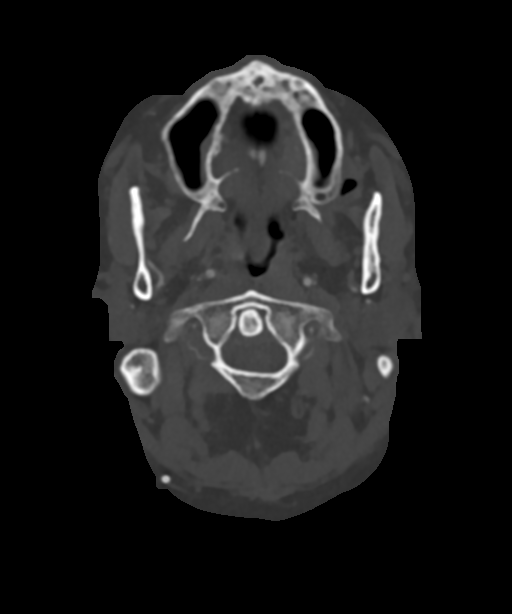
[im 255/357  soft-tissue]
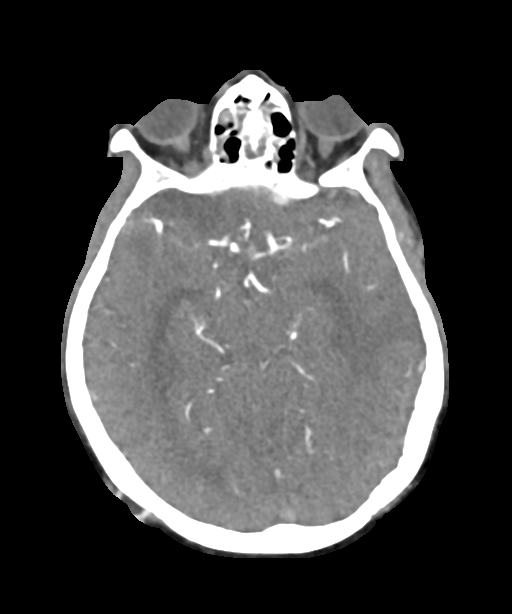
[im 306/357  bone]
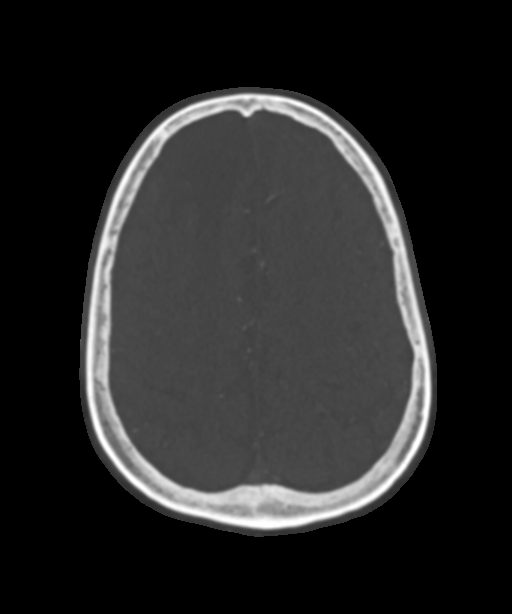

[8 of 33 positions shown; findings below may reference images not displayed]

FINDINGS: CTA NECK FINDINGS

Aortic arch: Mild atherosclerotic disease in the aortic arch and
proximal great vessels. Normal arch branching pattern. Proximal
great vessels patent without significant stenosis.

Right carotid system: Mild atherosclerotic calcification right
carotid bulb without significant stenosis

Left carotid system: Mild atherosclerotic disease left carotid
bifurcation without significant stenosis.

Vertebral arteries: Occlusion of the proximal right vertebral
artery. Reconstitution of a very small right vertebral artery at the
C3 level which supplies small PICA.

Occlusion of the proximal left vertebral artery. Reconstitution of a
very small distal left vertebral artery at the C2 level which
supplies a small left PICA.

Skeleton: Posterior cerclage and bony fusion C3 through C5. No acute
skeletal abnormality.

Other neck: Negative for mass or adenopathy

Upper chest: Moderate to severe apically emphysema bilaterally.

Review of the MIP images confirms the above findings

CTA HEAD FINDINGS

Anterior circulation: Atherosclerotic calcification in the cavernous
carotid bilaterally with mild stenosis bilaterally.

Mild fusiform enlargement of the right M1 segment. No significant
stenosis but mild irregularity suggesting atherosclerotic disease.
Right middle cerebral artery branches patent. Both anterior cerebral
arteries patent. Small caliber left M1 segment. No significant left
MCA stenosis.

Posterior circulation: Fetal origin of the left posterior cerebral
artery appears to supply the basilar. Basilar is very small and is
occluded proximally. Small right posterior communicating artery.
Both posterior cerebral arteries are patent with mild irregularity
and mild stenosis on the left. PICA appears supplied from very small
vertebral arteries bilaterally.

Venous sinuses: Faint venous enhancement due to arterial phase
scanning

Anatomic variants: None

Review of the MIP images confirms the above findings
IMPRESSION: 1. Atherosclerotic disease in the carotid bifurcation bilaterally
without significant stenosis. Mild stenosis in the cavernous carotid
bilaterally. Atherosclerotic fusiform dilatation right M1 segment.
2. Occlusion of the proximal and mid vertebral artery bilaterally.
Reconstitution of tiny distal vertebral artery bilaterally with
supplies PICA bilaterally. Proximal basilar is occluded. Mid distal
basilar supplied from the posterior communicating arteries
bilaterally. Mild stenosis left PCA.
3. Negative for emergent large vessel occlusion

## 2020-01-02 IMAGING — MR MRI HEAD WITHOUT CONTRAST
11 of 16 series · 35 of 48 positions shown · non-contrast
Comparison: Prior CT and CTA from 12/15/2018 as well as previous
MRI from 03/18/2017.

CLINICAL DATA: Initial evaluation for acute focal neural deficit,
left-sided weakness. History of TBI, stroke, hydrocephalus, status
post VP shunt.

EXAM:
MRI HEAD WITHOUT CONTRAST
TECHNIQUE: Multiplanar, multiecho pulse sequences of the brain and surrounding
structures were obtained without intravenous contrast.

[Series 3: DWI · axial · 3.0mm · 1.09mm/px · z∈[-58,+80]mm · 8 of 94 slices shown (1 of 5)]
[im 1/94]
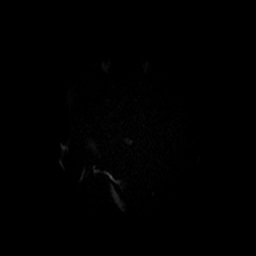
[im 14/94]
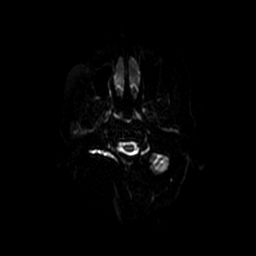
[im 27/94]
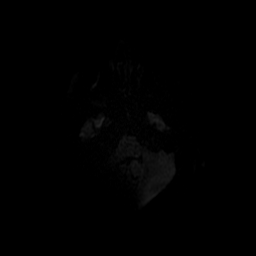
[im 40/94]
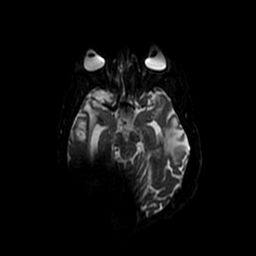
[im 54/94]
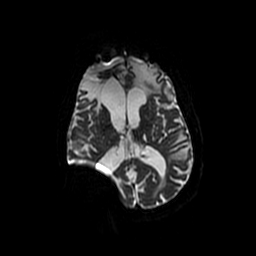
[im 67/94]
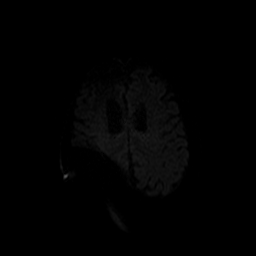
[im 80/94]
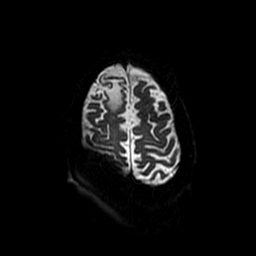
[im 94/94]
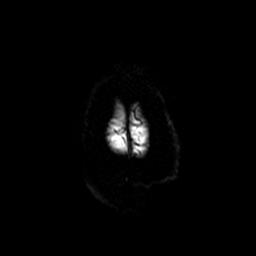

[Series 4: FLAIR · axial · 3.0mm · 0.47mm/px · z∈[-59,+79]mm · 2 of 24 slices shown (1 of 4)]
[im 1/24]
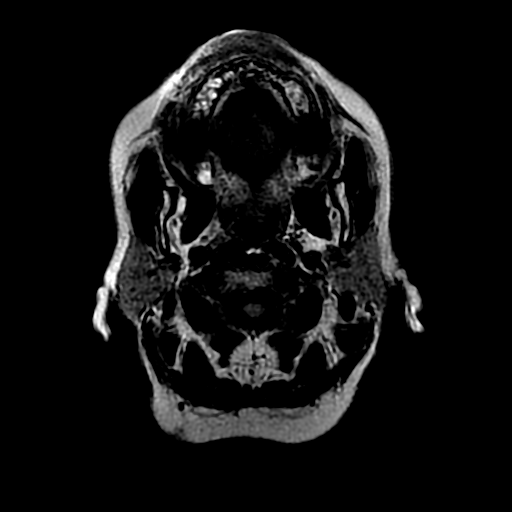
[im 24/24]
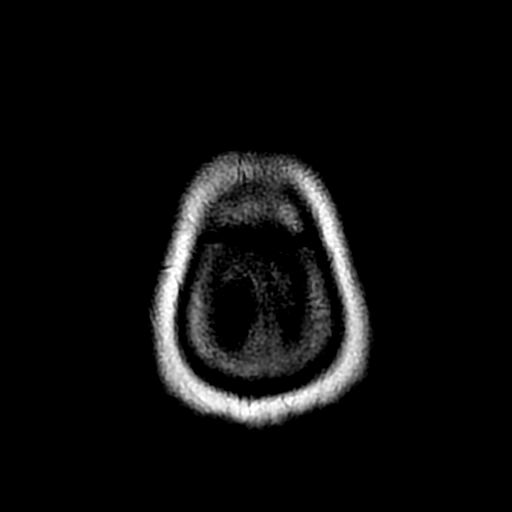

[Series 6: T2 · axial · 5.0mm · 0.47mm/px · 1 of 24 slices shown]
[im 1/24]
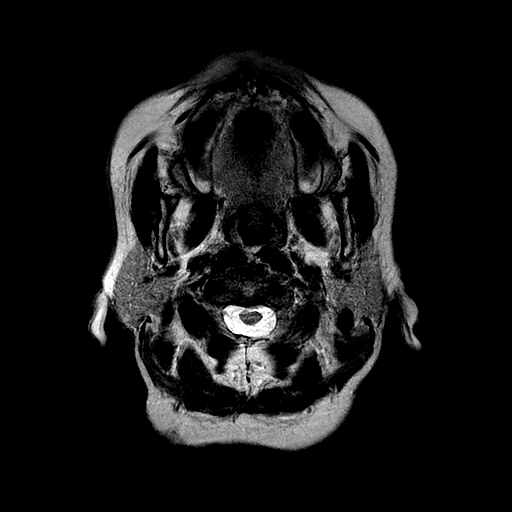

[Series 7: DWI · coronal · 5.0mm · 1.09mm/px · 6 of 70 slices shown (2 of 5)]
[im 1/70]
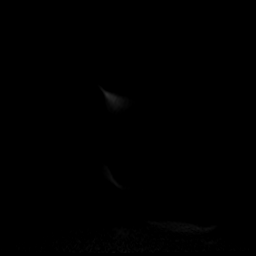
[im 14/70]
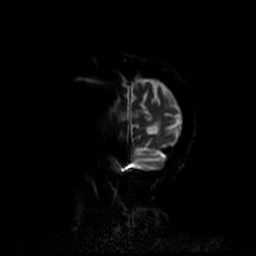
[im 28/70]
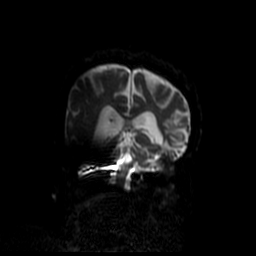
[im 42/70]
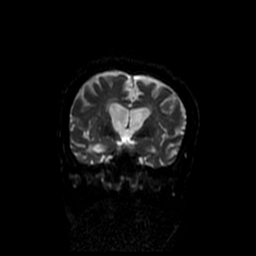
[im 56/70]
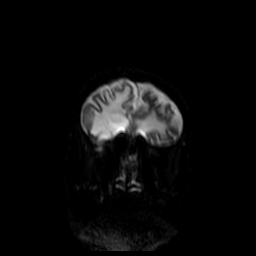
[im 70/70]
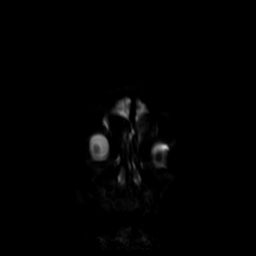

[Series 8: DWI · axial · 5.0mm · 0.94mm/px · z∈[-59,+79]mm · 2 of 24 slices shown (3 of 5)]
[im 1/24]
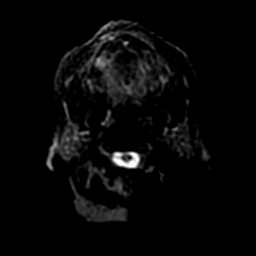
[im 24/24]
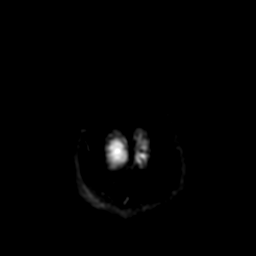

[Series 9: FLAIR · sagittal · 5.0mm · 0.47mm/px · 2 of 23 slices shown (2 of 4)]
[im 1/23]
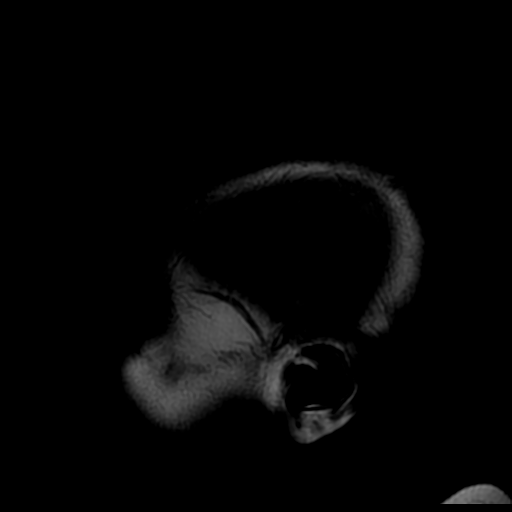
[im 23/23]
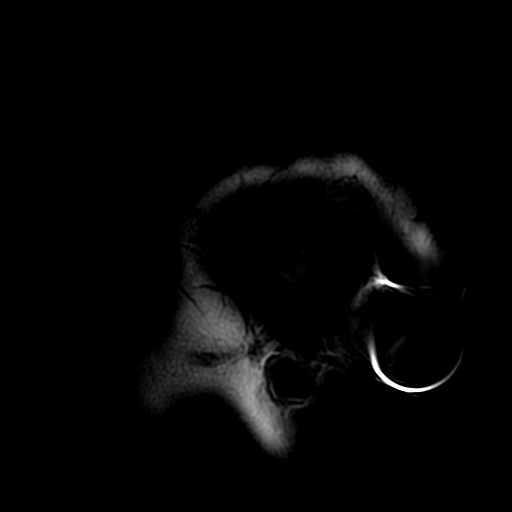

[Series 10: FLAIR · axial · 5.0mm · 0.47mm/px · z∈[-59,+79]mm · 2 of 24 slices shown (3 of 4)]
[im 1/24]
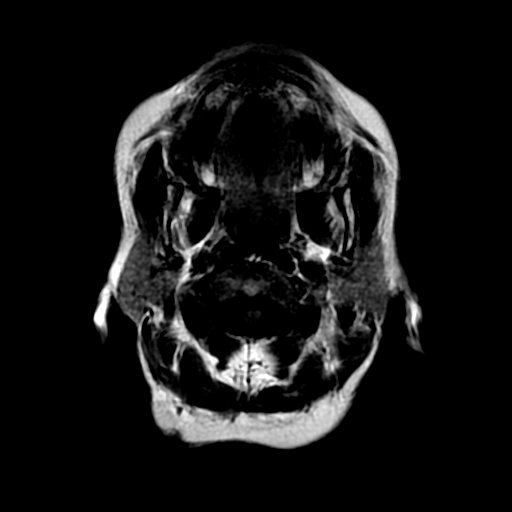
[im 24/24]
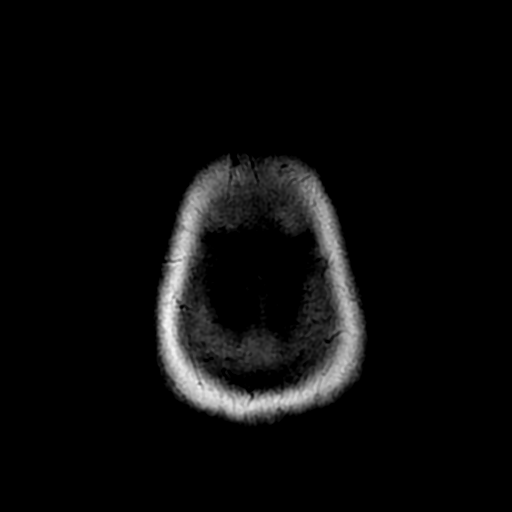

[Series 13: FLAIR · coronal · 3.0mm · 0.35mm/px · 3 of 29 slices shown (4 of 4)]
[im 1/29]
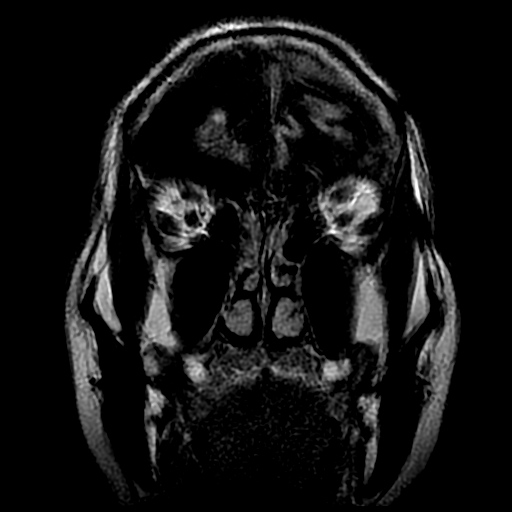
[im 15/29]
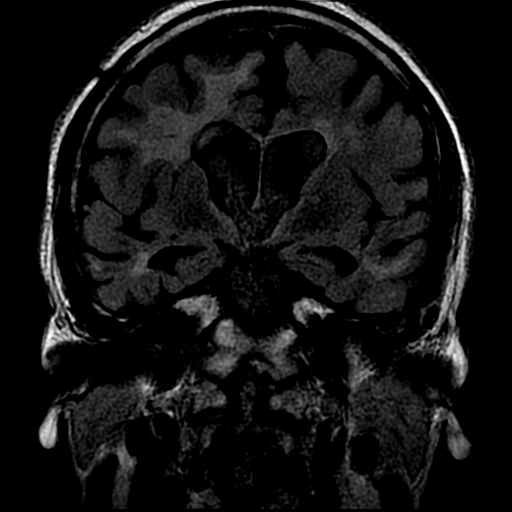
[im 29/29]
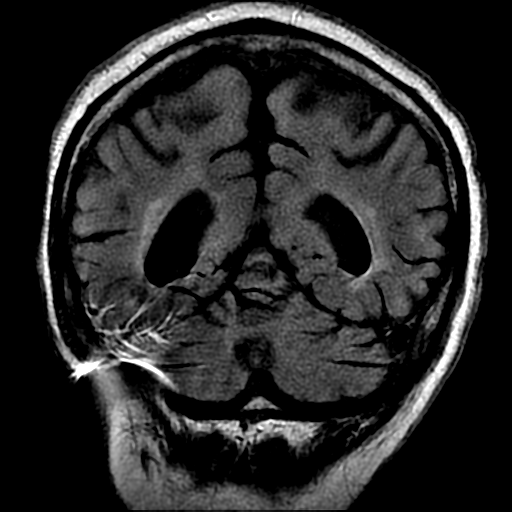

[Series 300: DWI · axial · 3.0mm · 1.09mm/px · z∈[-58,+80]mm · 4 of 47 slices shown (4 of 5)]
[im 1/47]
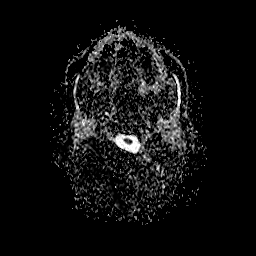
[im 16/47]
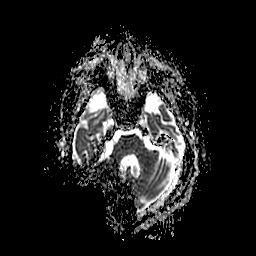
[im 31/47]
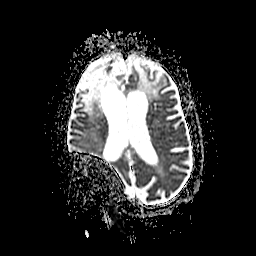
[im 47/47]
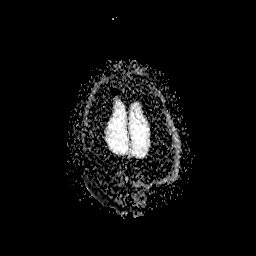

[Series 700: DWI · coronal · 5.0mm · 1.09mm/px · 3 of 35 slices shown (5 of 5)]
[im 1/35]
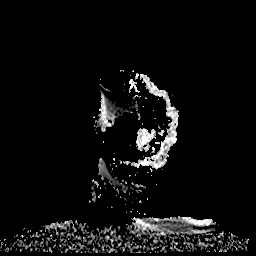
[im 18/35]
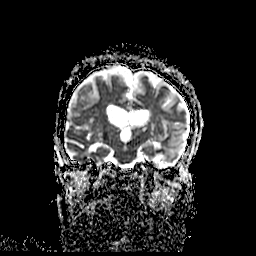
[im 35/35]
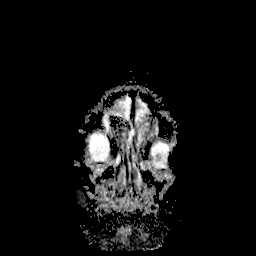

[Series 800: ADC · axial · 5.0mm · 0.94mm/px · z∈[-59,+79]mm · 2 of 24 slices shown]
[im 1/24]
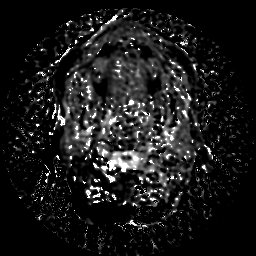
[im 24/24]
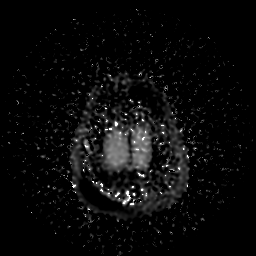

[35 of 48 positions shown; findings below may reference images not displayed]

FINDINGS: Brain: Moderately advanced cerebral atrophy for age. Underlying mild
to moderate chronic microvascular ischemic changes present within
the periventricular white matter. Superimposed few scattered remote
lacunar infarcts, most notable at the left thalamus.

Extensive encephalomalacia and gliosis seen involving the anterior
frontal and temporal lobes bilaterally, most likely related to
traumatic brain injury. Superficial siderosis present within these
regions. Focal dural thickening and/or chronic extra-axial hygroma
overlying the bilateral frontal convexities measure up to 3 mm on
the right and 7 mm on the left (series 4, image 16), stable from
previous. Associated ex vacuo dilatation of the frontal horns of
both lateral ventricles, right greater than left. Right parietal
approach VP shunt catheter in place with tip terminating at the
septum pellucidum. Susceptibility artifact about the shunt reservoir
at the right occipital scalp. Diffuse ventricular prominence
relatively stable from previous. No mass lesion or new midline
shift.

14 mm focus of restricted diffusion seen involving the posterior
limb of the right internal capsule, compatible with acute ischemic
infarct (series 3, image 25). No associated hemorrhage or mass
effect. No other evidence for acute or subacute ischemia. Gray-white
matter differentiation otherwise maintained.

Vascular: Major intracranial vascular flow voids grossly maintained
at the skull base. Diminutive vertebrobasilar system noted.

Skull and upper cervical spine: Craniocervical junction within
normal limits. Upper cervical spine normal. Bone marrow signal
intensity within normal limits. No acute scalp soft tissue
abnormality. Susceptibility artifact from shunt reservoir or at the
right occipital scalp.

Sinuses/Orbits: No acute abnormality about the globes orbital soft
tissues. Scattered chronic mucosal thickening throughout the
paranasal sinuses, most notable at the right frontal sinus and
ethmoidal air cells. No air-fluid level to suggest acute sinusitis.
Small left mastoid effusion noted, of doubtful significance.

Other: None.
IMPRESSION: 1. 14 mm acute ischemic nonhemorrhagic infarct involving the
posterior limb of the right internal capsule.
2. Sequelae of prior traumatic brain injury with extensive bifrontal
and temporal lobe encephalomalacia, stable from previous.
3. Right parietal approach VP shunt catheter in place with tip
terminating at the septum pellucidum. Ventriculomegaly stable from
previous.
4. Underlying advanced cerebral atrophy with chronic microvascular
ischemic disease.

## 2020-03-23 IMAGING — CT CT HEAD WITHOUT CONTRAST
4 series · 16 of 47 positions shown, 18 images · non-contrast
Comparison: Head CT dated 06/17/2018

CLINICAL DATA: 67-year-old male with altered mental status. History
of strokes and seizure.

EXAM:
CT HEAD WITHOUT CONTRAST
TECHNIQUE: Contiguous axial images were obtained from the base of the skull
through the vertex without intravenous contrast.

[Series 3: head wo · axial · 0.46mm/px · z∈[-158,-33]mm · 7 of 35 slices shown, 9 images]
[im 5/35  brain]
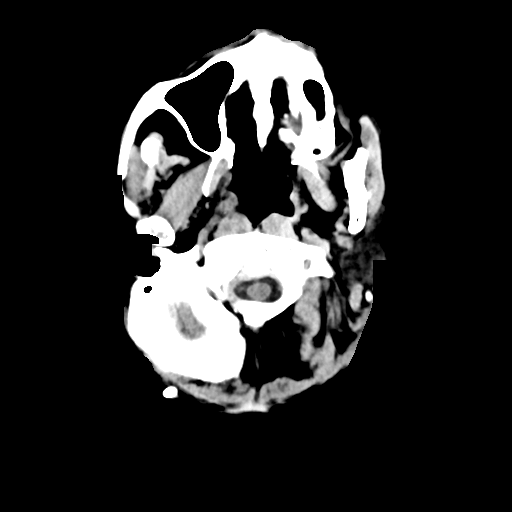
[im 5/35  bone]
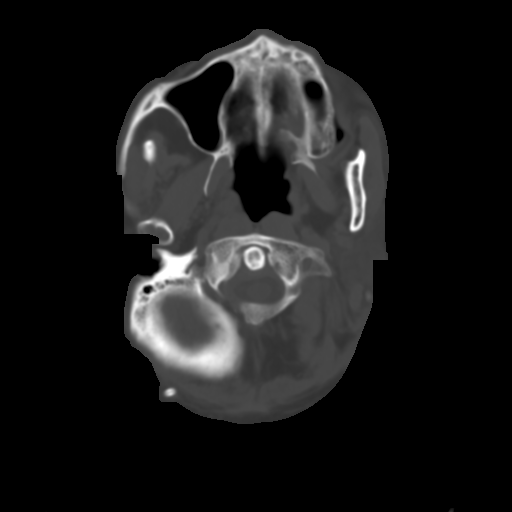
[im 9/35  brain]
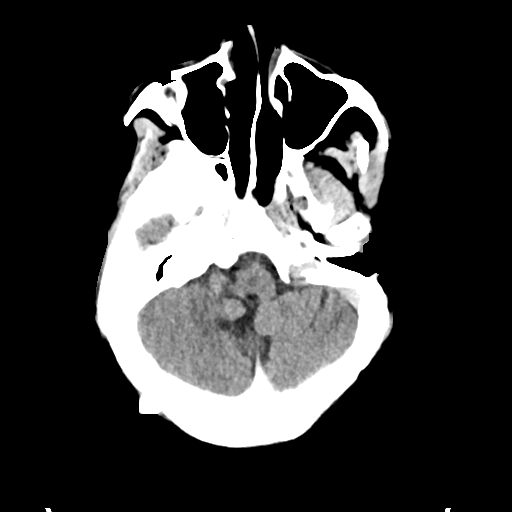
[im 13/35  brain]
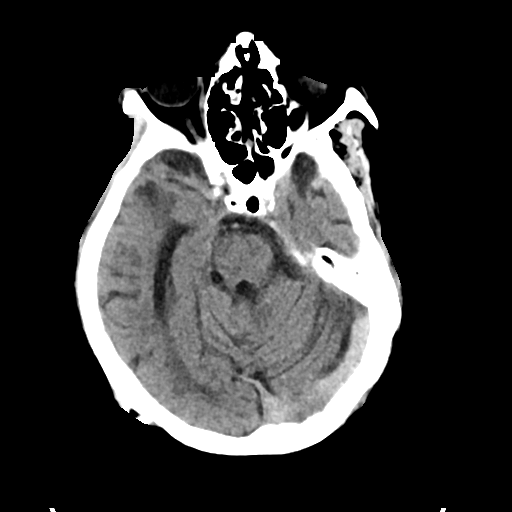
[im 18/35  brain]
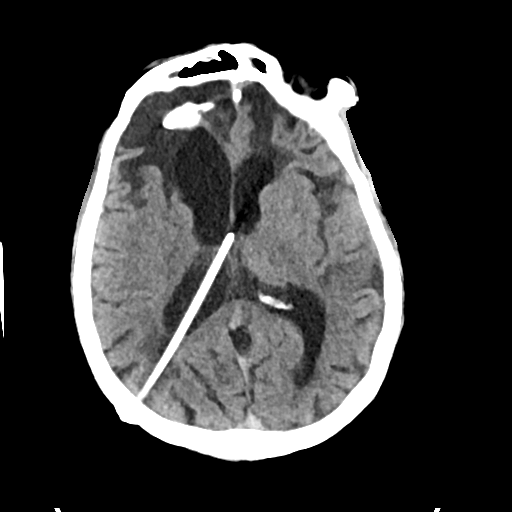
[im 22/35  brain]
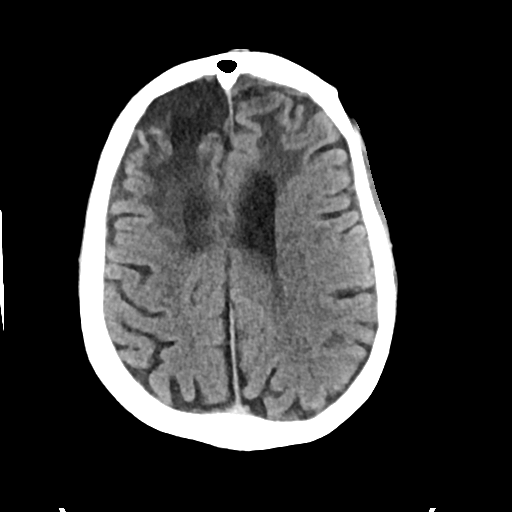
[im 22/35  bone]
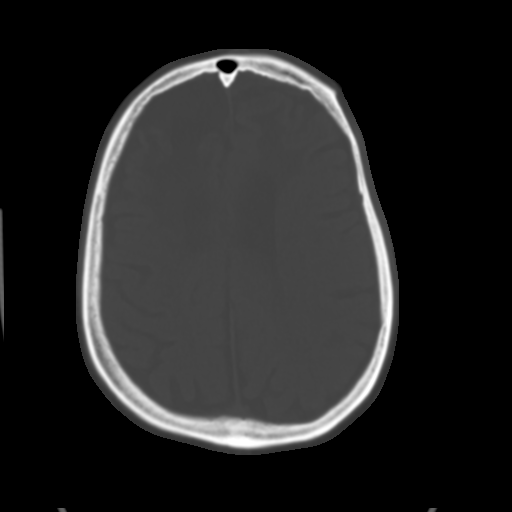
[im 26/35  brain]
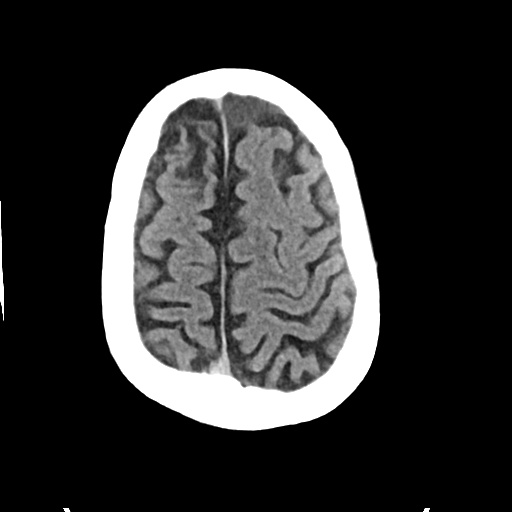
[im 30/35  brain]
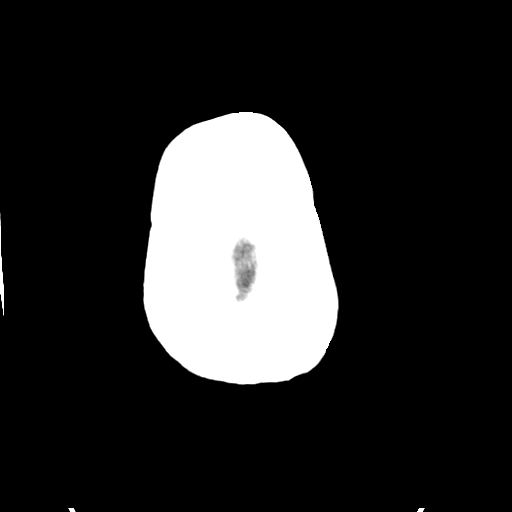

[Series 4: head bone · axial · 0.46mm/px · z∈[-162,-128]mm · 3 of 87 slices shown]
[im 9/87  bone]
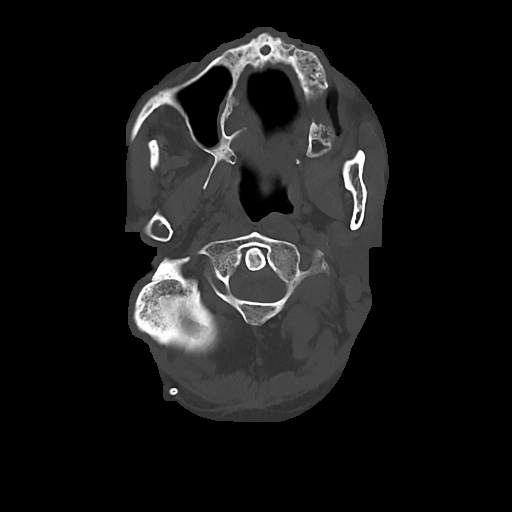
[im 18/87  bone]
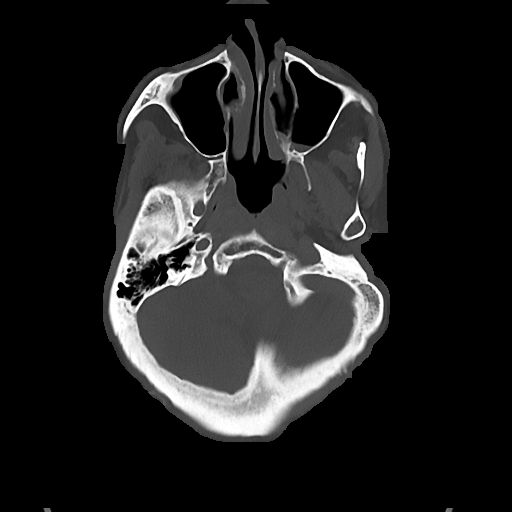
[im 26/87  bone]
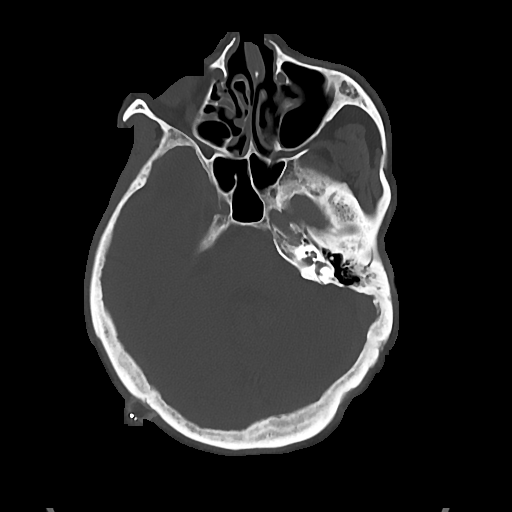

[Series 5: cor soft · coronal · 0.34mm/px · 3 of 73 slices shown]
[im 25/73  brain]
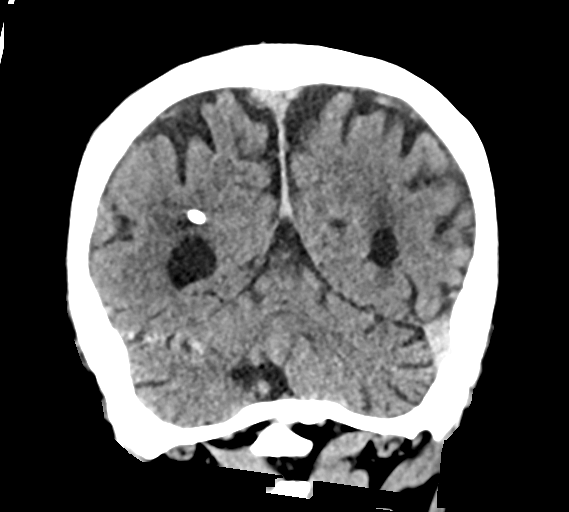
[im 33/73  brain]
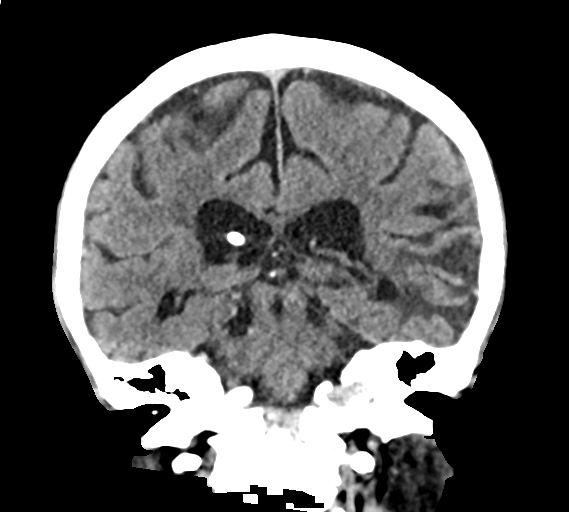
[im 41/73  brain]
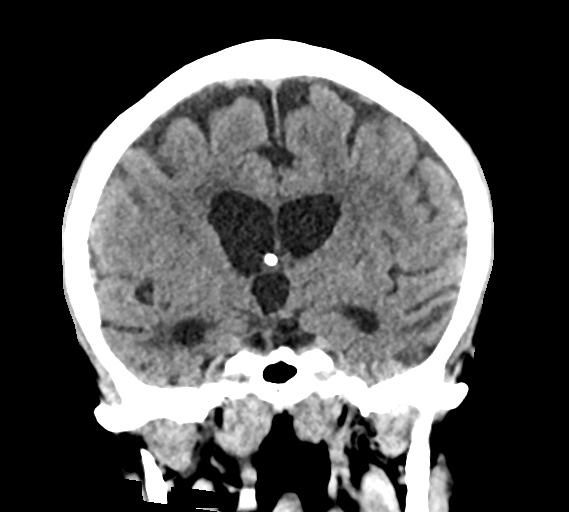

[Series 6: sag soft · sagittal · 0.34mm/px · 3 of 60 slices shown]
[im 20/60  brain]
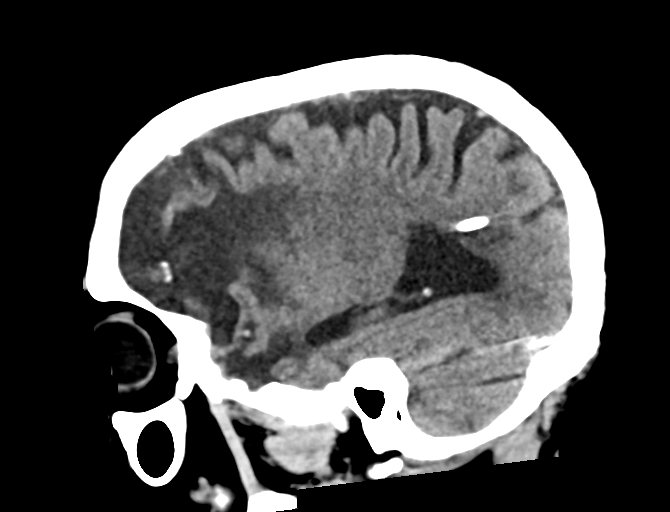
[im 30/60  brain]
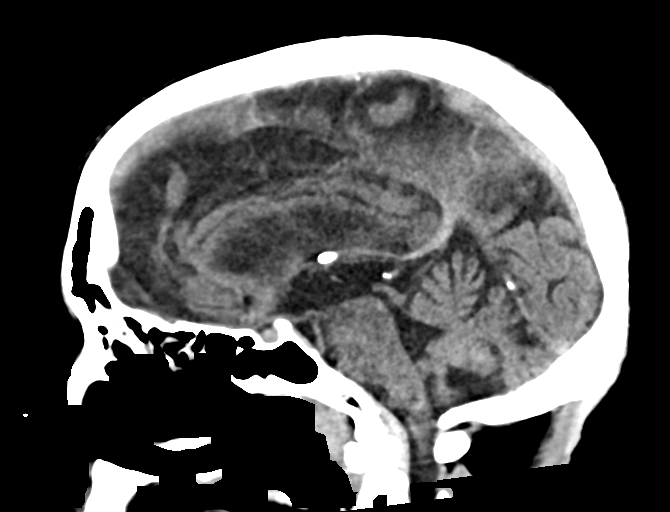
[im 40/60  brain]
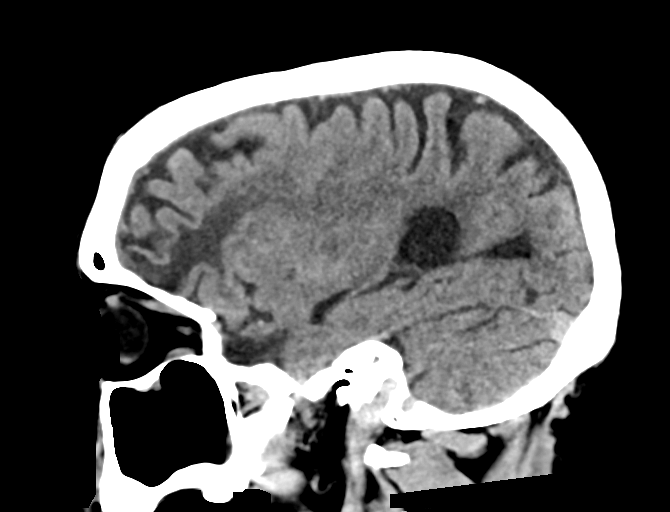

[16 of 47 positions shown; findings below may reference images not displayed]

FINDINGS: Brain: Right parietal ventriculostomy shunt with tip over the
midline at the level of the foramen Asif in similar position as
before. No significant interval change in ventricular dilatation.
Large areas of old infarct and encephalomalacia noted involving the
frontal lobes bilaterally as well as an area of old infarct
involving the left temporal lobe. Focal coarse calcification in the
right frontal lobe is chronic, likely dystrophic calcification.
There is no acute intracranial hemorrhage. No midline shift.

Vascular: No hyperdense vessel or unexpected calcification.

Skull: No acute calvarial pathology.

Sinuses/Orbits: Mild mucoperiosteal thickening paranasal sinuses. No
air-fluid level. The mastoid air cells are clear.

Other: None
IMPRESSION: 1. No acute intracranial hemorrhage.  Interval change.
2. Right parietal ventriculostomy shunt in similar position as
before. No significant interval change in the ventricular size.
3. Bifrontal and left temporal lobes old infarcts.

## 2020-03-23 IMAGING — DX PORTABLE CHEST - 1 VIEW
1 series · 1 of 1 positions shown · non-contrast
Comparison: December 15, 2018

CLINICAL DATA: Aspiration

EXAM:
PORTABLE CHEST 1 VIEW

[chest]
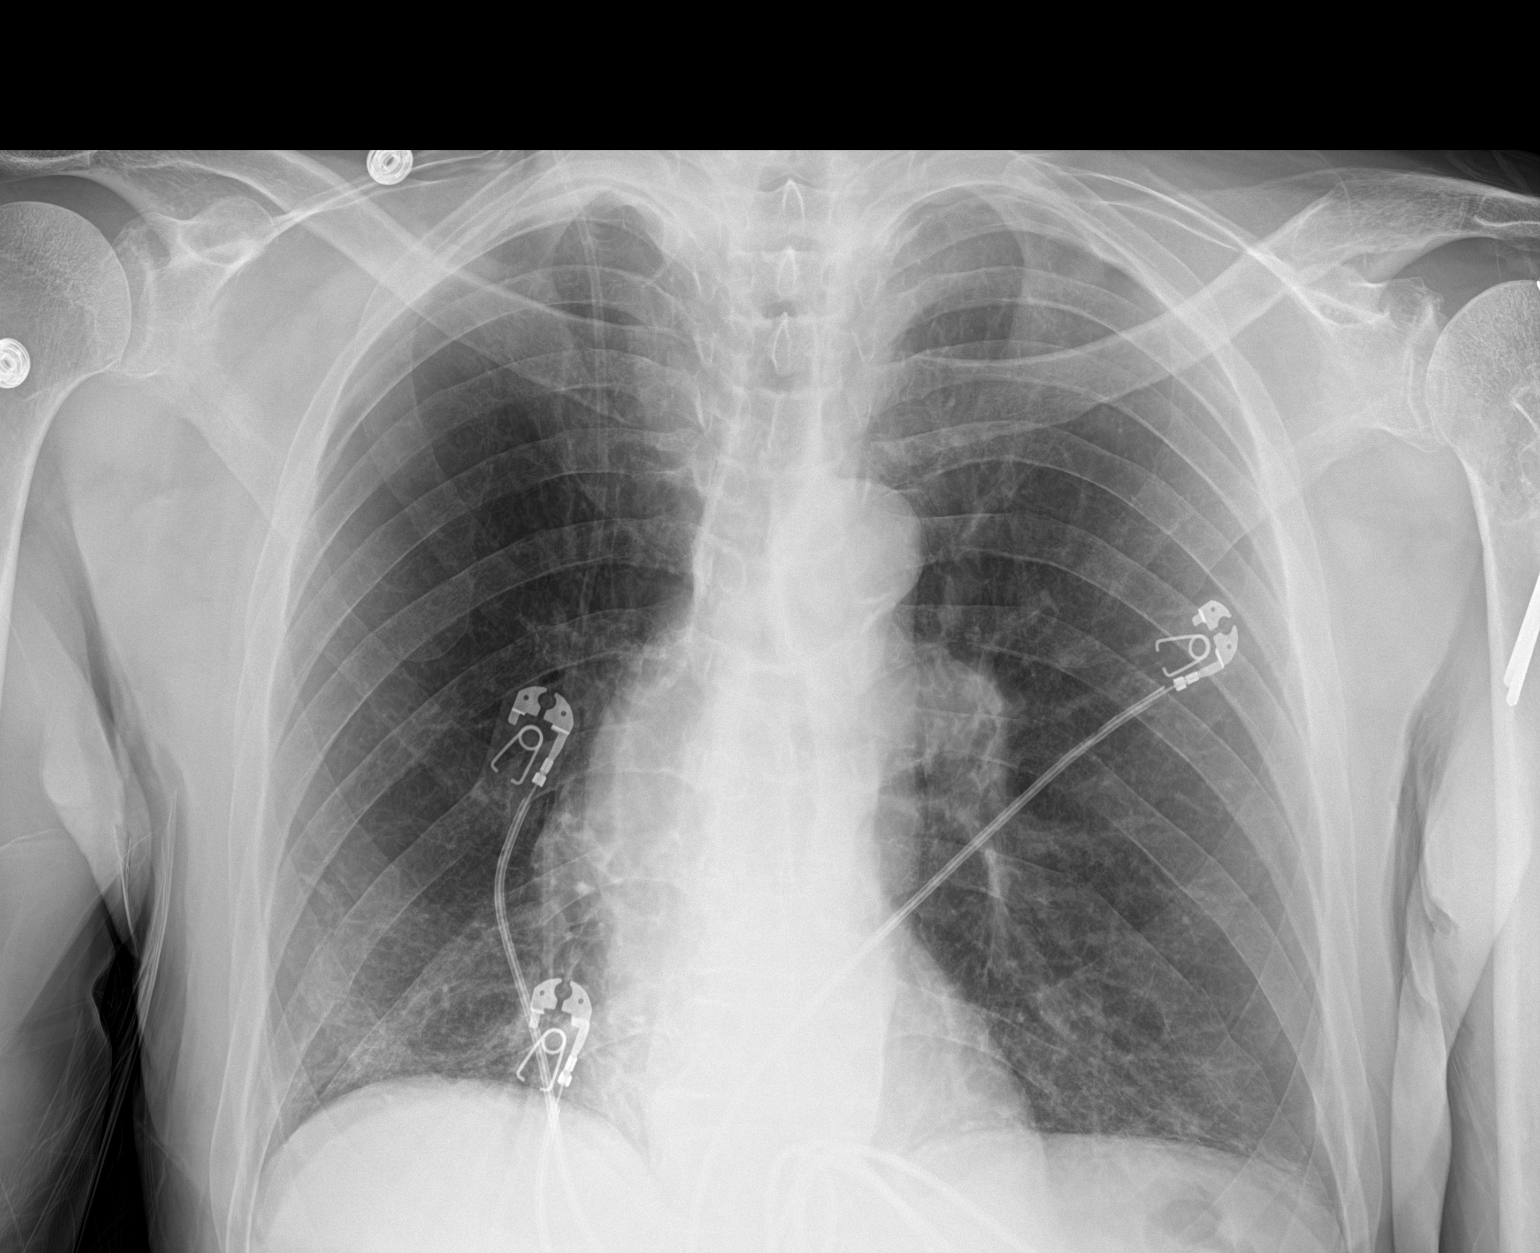

[1 of 1 positions shown; findings below may reference images not displayed]

FINDINGS: There is a catheter that projects over the patient's right chest
wall, favored to represent a VP shunt. The heart size is stable.
Aortic calcifications are noted. There are bibasilar airspace
opacities that appear new since prior study [REDACTED]. There is no
pneumothorax. No significant pleural effusion. There is no acute
osseous abnormality.
IMPRESSION: New bibasilar airspace opacities which may represent atelectasis,
aspiration, or infiltrate.

## 2020-03-24 IMAGING — DX PORTABLE CHEST - 1 VIEW
1 series · 1 of 1 positions shown · non-contrast
Comparison: Radiograph March 07, 2019.

CLINICAL DATA: Shortness of breath, aspiration.

EXAM:
PORTABLE CHEST 1 VIEW

[chest ap]
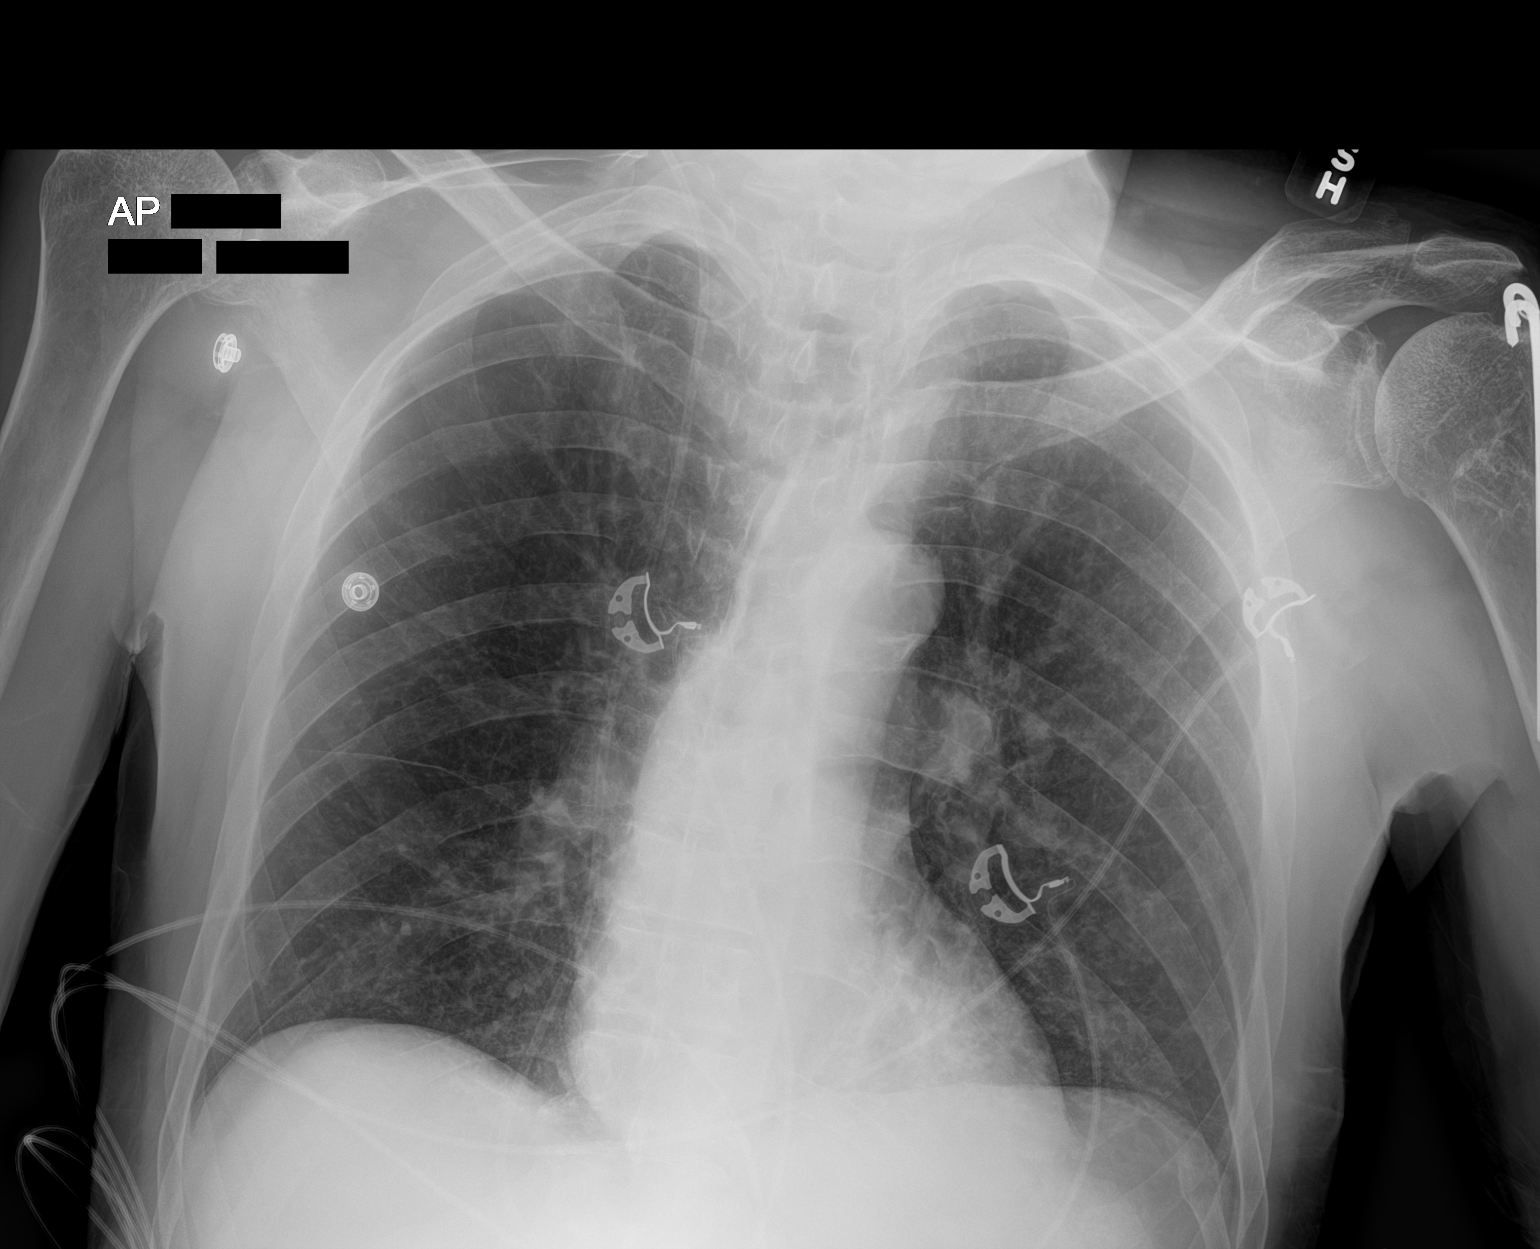

[1 of 1 positions shown; findings below may reference images not displayed]

FINDINGS: The heart size and mediastinal contours are within normal limits.
Right internal jugular catheter is unchanged in position. No
pneumothorax or pleural both lungs are clear. The visualized
skeletal structures are unremarkable.
IMPRESSION: No active disease.

## 2020-03-29 IMAGING — CT CT ABDOMEN WITHOUT CONTRAST
2 of 4 series · 16 of 46 positions shown, 18 images · non-contrast
Comparison: None.

CLINICAL DATA: 67-year-old male undergoing anatomic evaluation
prior to percutaneous gastrostomy tube placement.

EXAM:
CT ABDOMEN WITHOUT CONTRAST
TECHNIQUE: Multidetector CT imaging of the abdomen was performed following the
standard protocol without IV contrast.

[Series 3: abd/ pelvis 5.0 i30f 2 · axial · 0.73mm/px · z∈[+1179,+1399]mm · 13 of 49 slices shown, 15 images]
[im 3/49  soft-tissue]
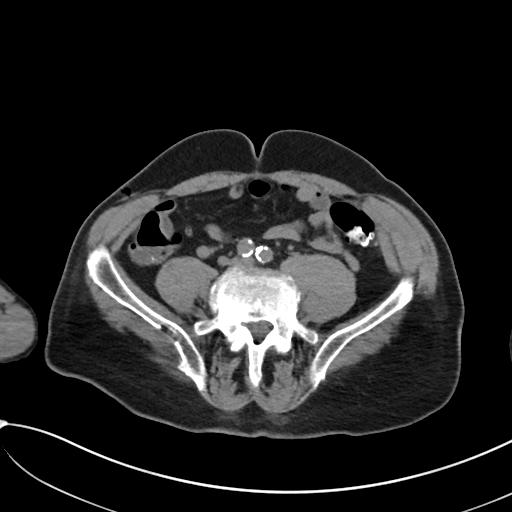
[im 3/49  bone]
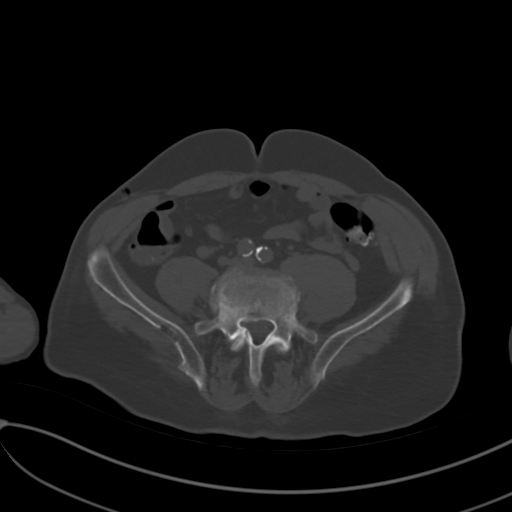
[im 7/49  soft-tissue]
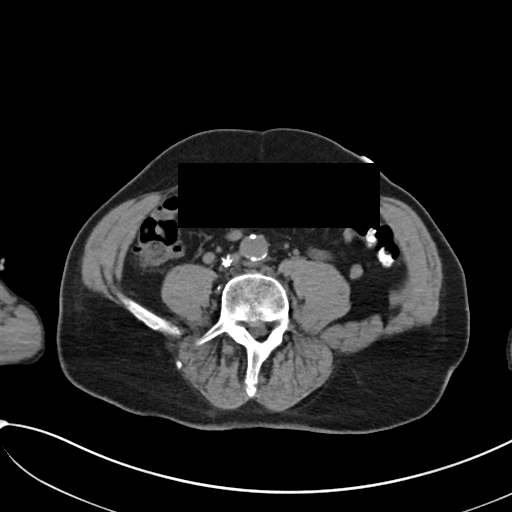
[im 11/49  soft-tissue]
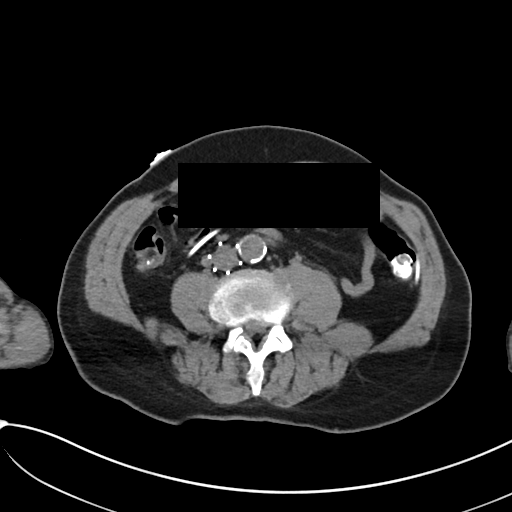
[im 15/49  soft-tissue]
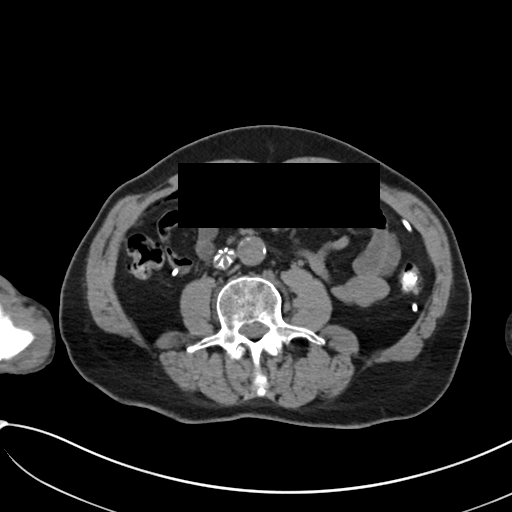
[im 17/49  soft-tissue]
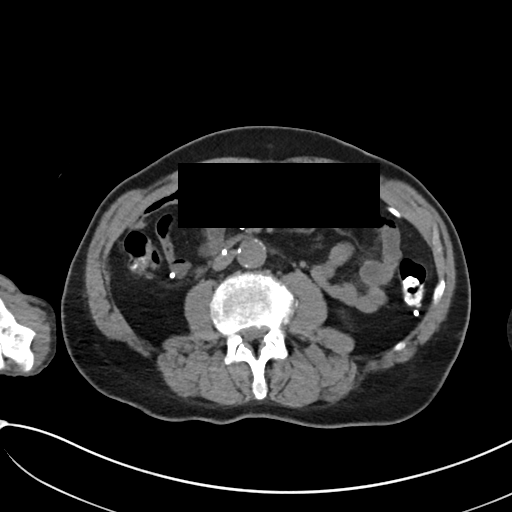
[im 21/49  soft-tissue]
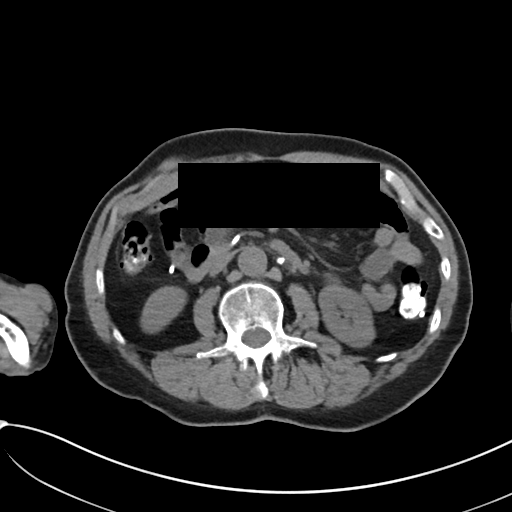
[im 25/49  soft-tissue]
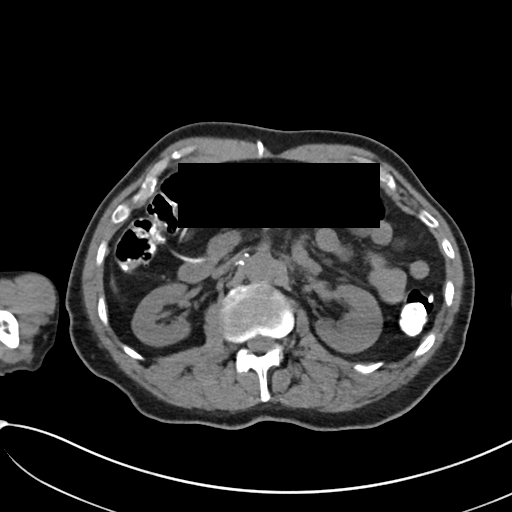
[im 29/49  soft-tissue]
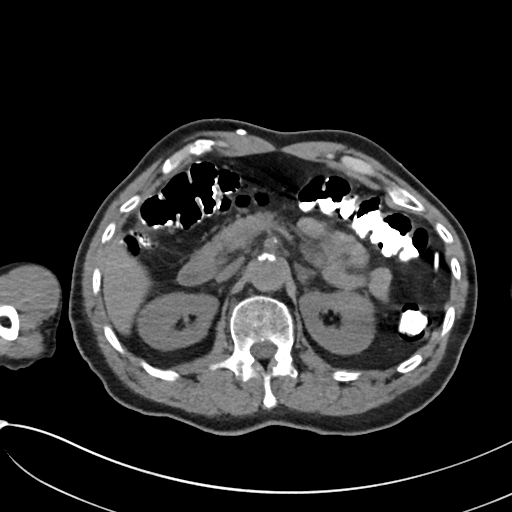
[im 33/49  soft-tissue]
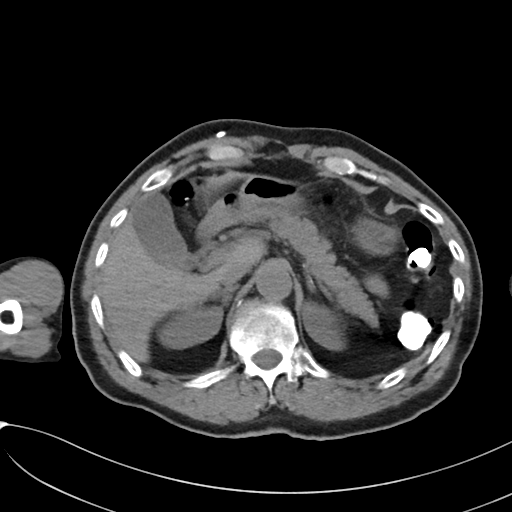
[im 33/49  bone]
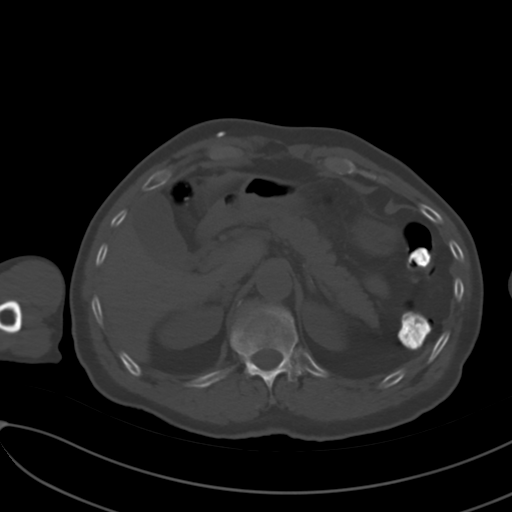
[im 35/49  soft-tissue]
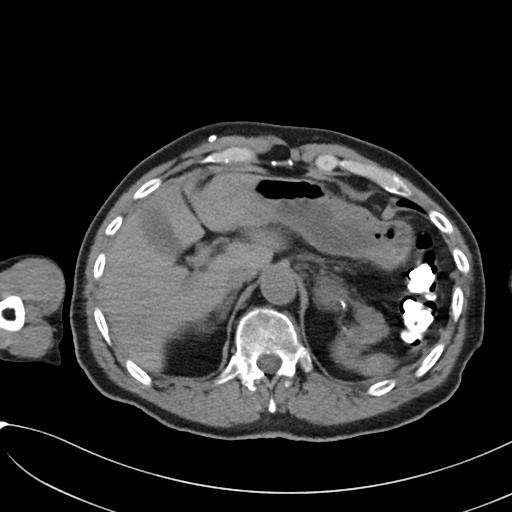
[im 39/49  soft-tissue]
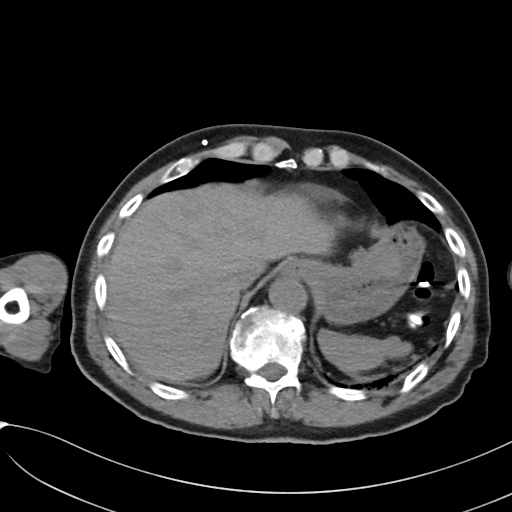
[im 43/49  soft-tissue]
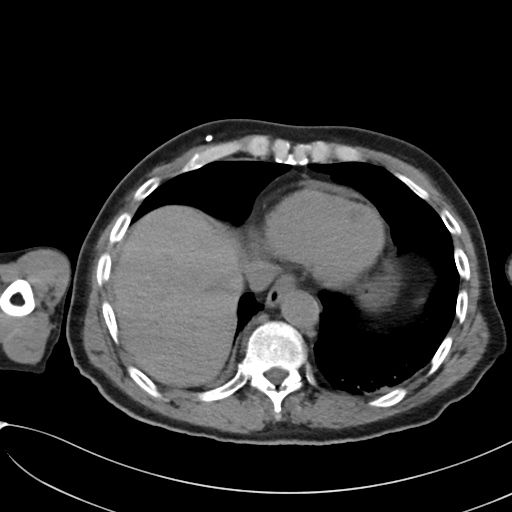
[im 47/49  soft-tissue]
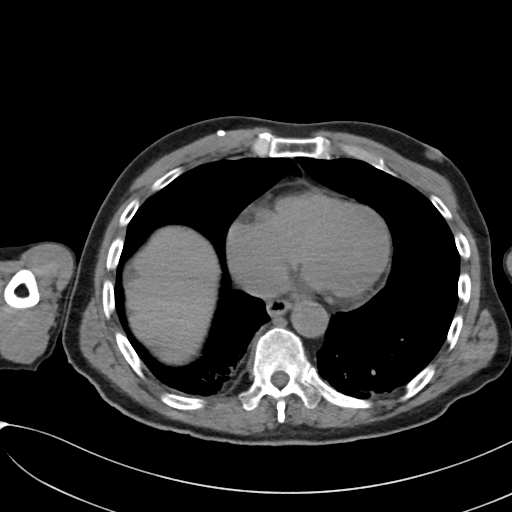

[Series 6: cor st · coronal · 0.49mm/px · 3 of 98 slices shown]
[im 33/98  soft-tissue]
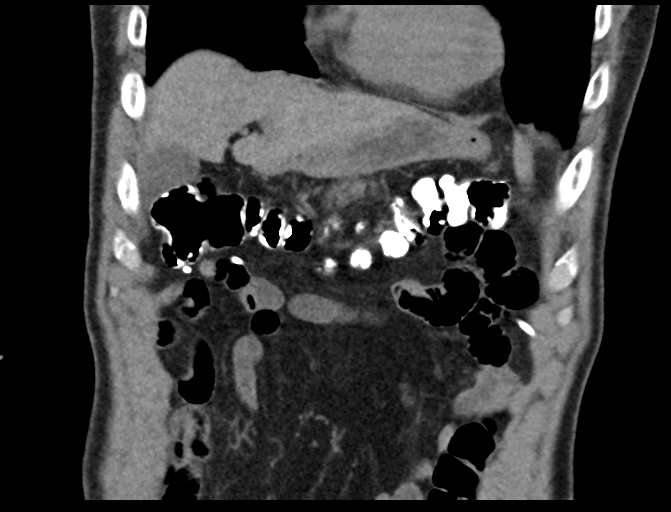
[im 44/98  soft-tissue]
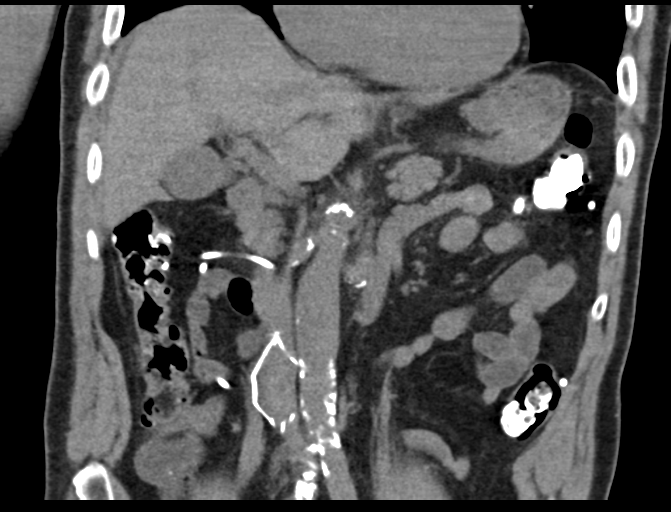
[im 54/98  soft-tissue]
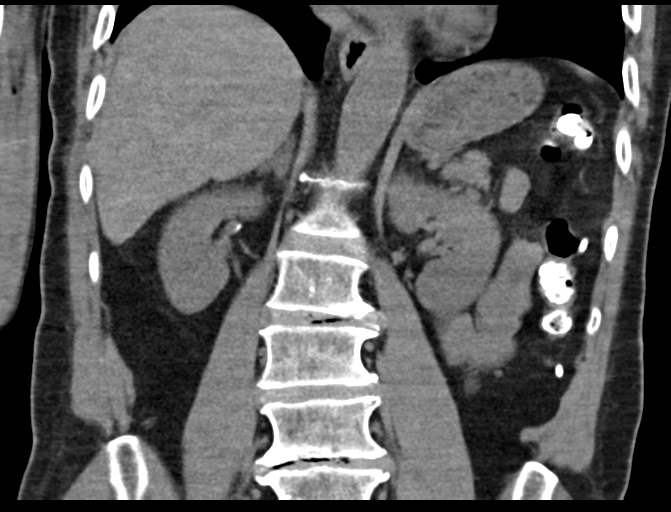

[16 of 46 positions shown; findings below may reference images not displayed]

FINDINGS: Lower chest: Mild dependent atelectasis. The visualized cardiac
structures are normal in size. Unremarkable distal thoracic
esophagus.

Hepatobiliary: Normal hepatic contour and morphology. No discrete
hepatic lesions. Normal appearance of the gallbladder. No intra or
extrahepatic biliary ductal dilatation.

Pancreas: Unremarkable. No pancreatic ductal dilatation or
surrounding inflammatory changes.

Spleen: Normal in size without focal abnormality.

Adrenals/Urinary Tract: Unremarkable adrenal glands. No
hydronephrosis or nephrolithiasis. The ureters are unremarkable.

Stomach/Bowel: Normal gastric anatomy. No significant wall
thickening or other un abnormality. Normal position of the colon.

Vascular/Lymphatic: Limited evaluation in the absence of intravenous
contrast. Scattered atherosclerotic calcifications. A non
retrievable TrapEase IVC filter is present in the infrarenal IVC.

Other: Incompletely imaged ventriculoperitoneal shunt catheter
coursing along the subcutaneous fat of the right anterior abdominal
wall before entering the peritoneum in the right upper quadrant. The
catheter tip is present in the left upper quadrant. No ascites.

Musculoskeletal: No acute fracture or aggressive appearing lytic or
blastic osseous lesion.
IMPRESSION: 1. Suitable anatomy for percutaneous gastrostomy tube.
2. A ventriculoperitoneal shunt catheter is present.
3. Non retrievable OptEase infrarenal IVC filter in place.
4.  Aortic Atherosclerosis ([id number]-170.0).

## 2020-04-01 IMAGING — DX ABDOMEN - 1 VIEW
2 series · 2 of 2 positions shown · non-contrast
Comparison: CT 03/13/2019

CLINICAL DATA: Nausea, vomiting

EXAM:
ABDOMEN - 1 VIEW

[abdomen kub (1 of 2)]
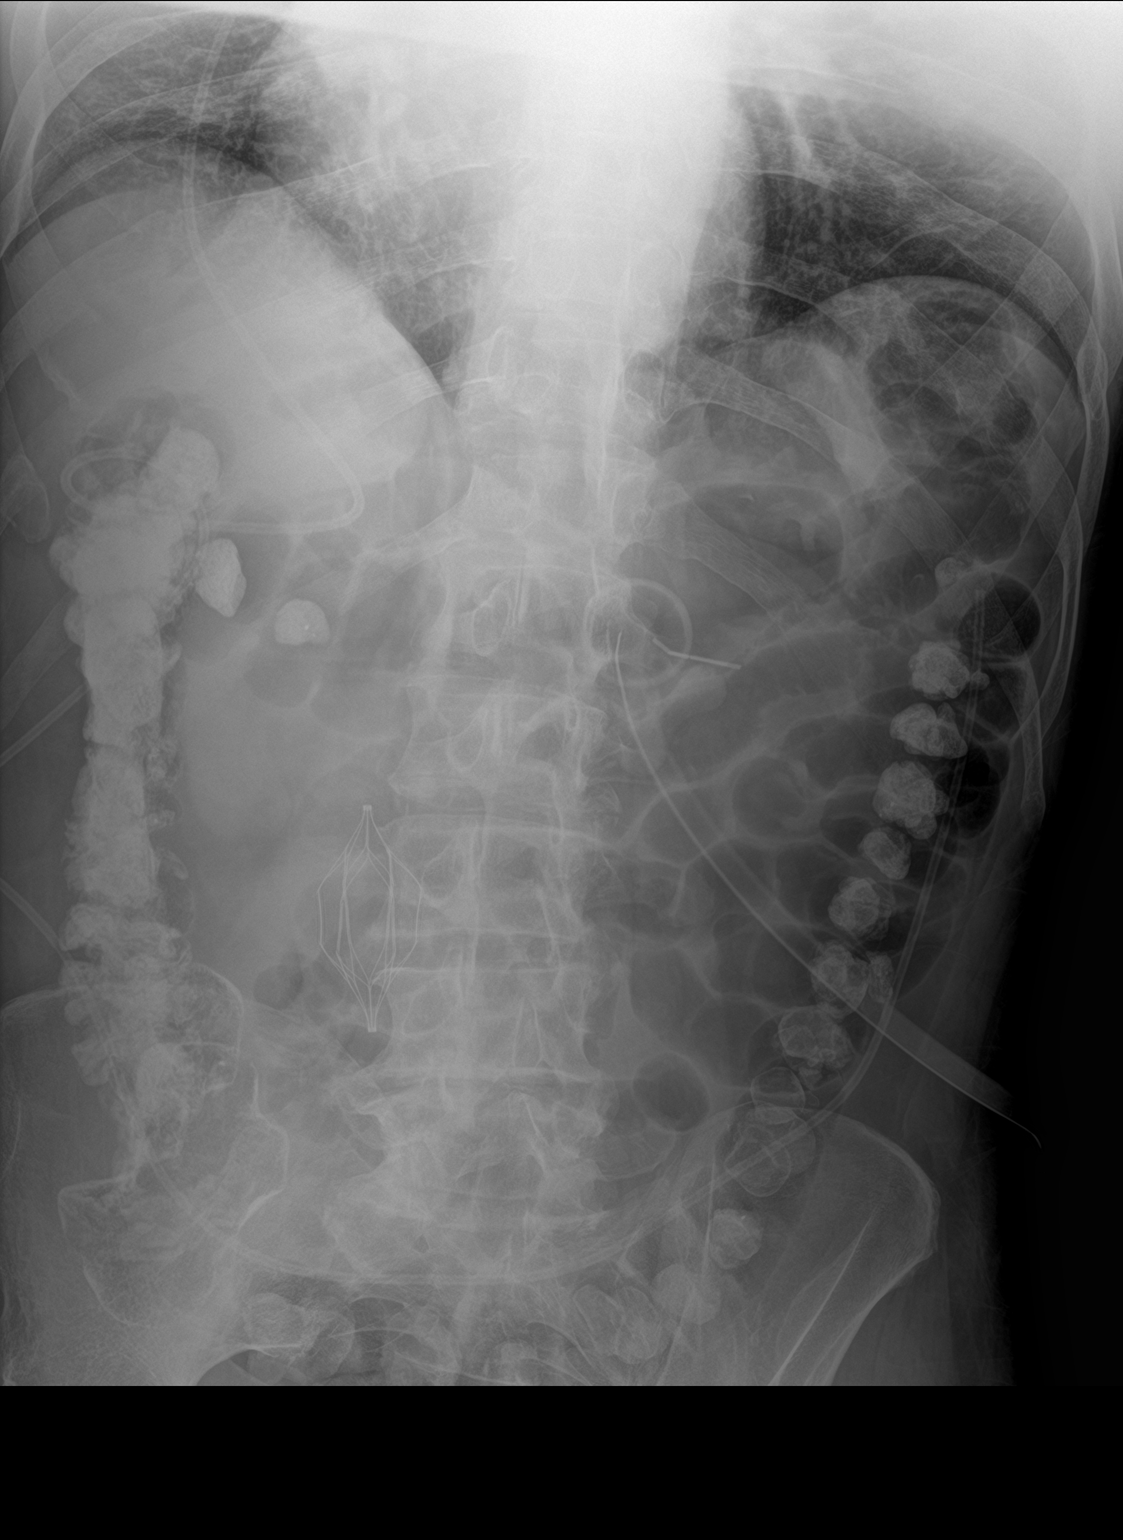

[abdomen kub (2 of 2)]
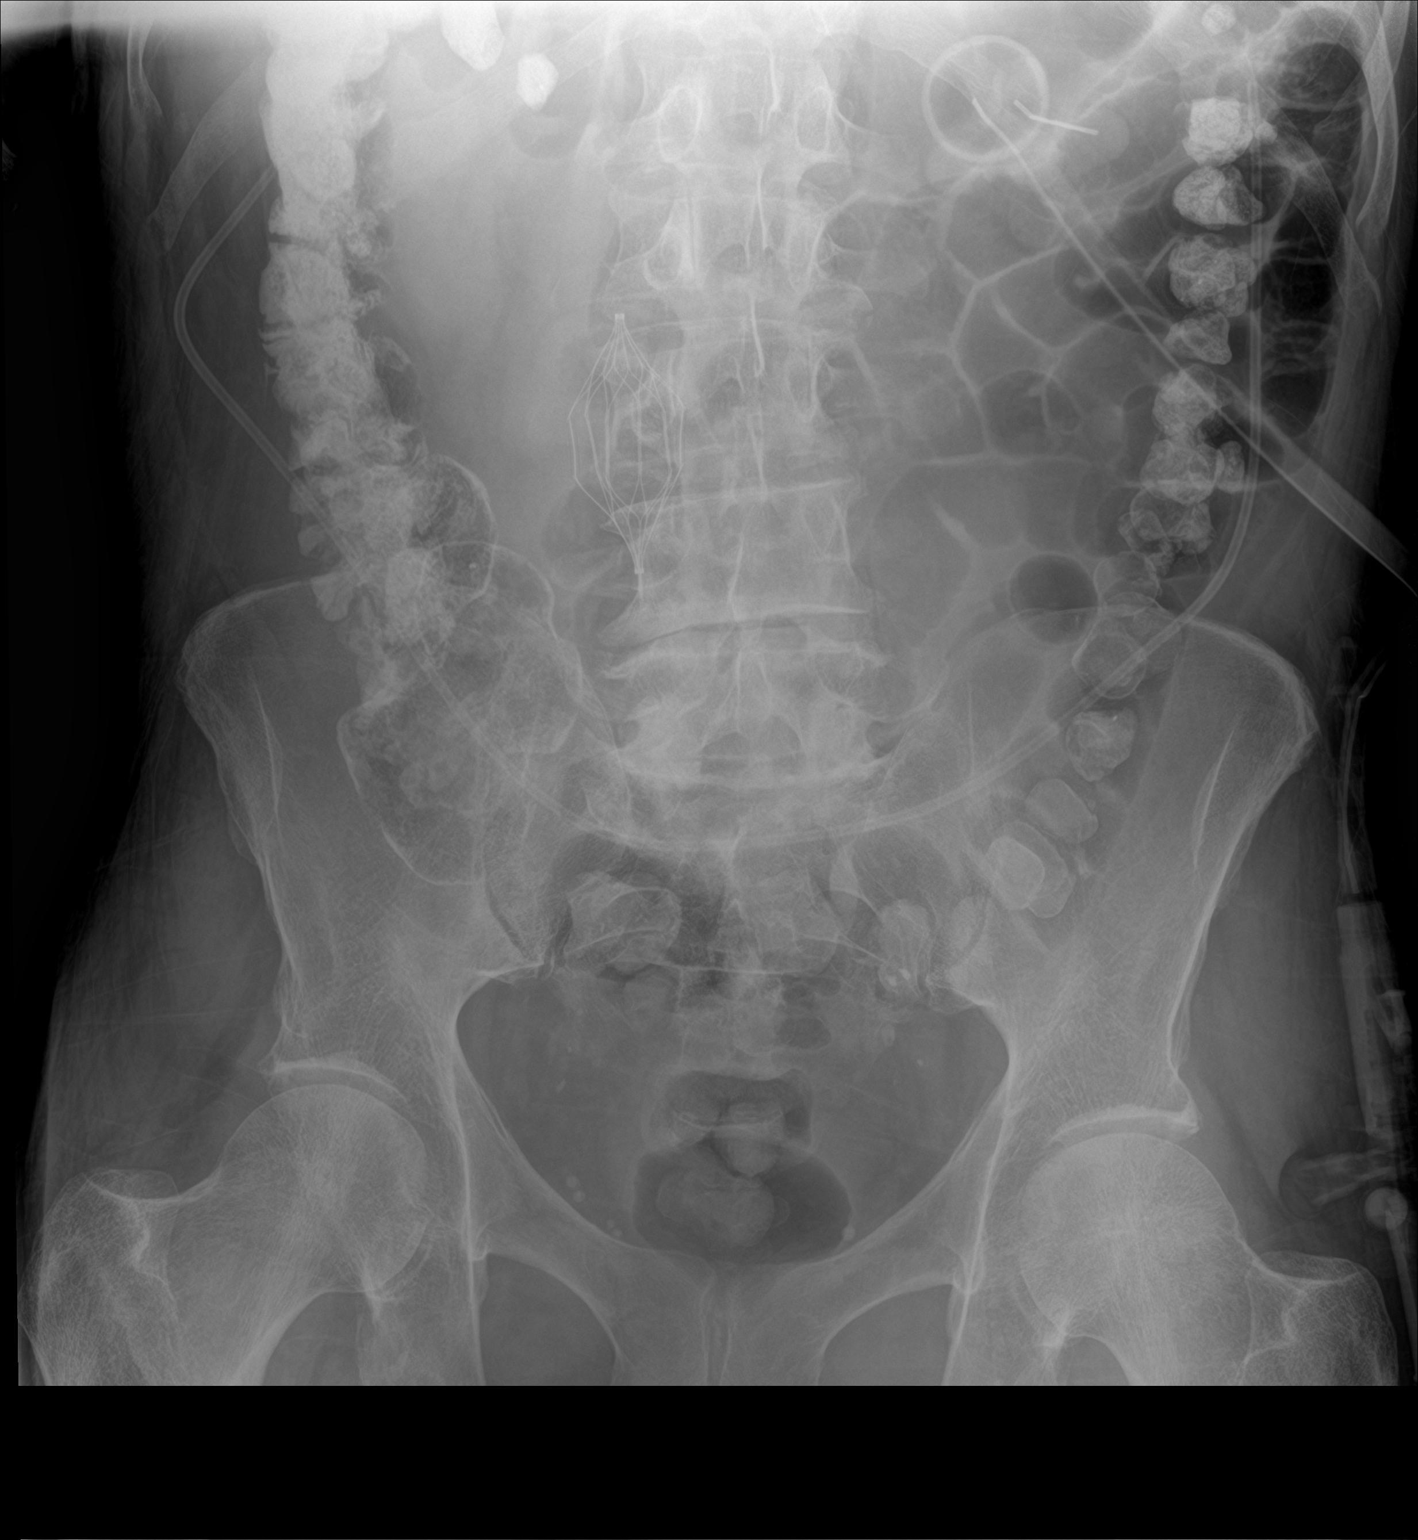

[2 of 2 positions shown; findings below may reference images not displayed]

FINDINGS: Gastrostomy tube projects over the stomach. Oral contrast material
with[REDACTED]ompressed colon. Mild gaseous distention of several small
bowel loops in the left abdomen are similar to prior CT. No
convincing evidence for bowel obstruction. VP shunt catheter remains
in place. IVC filter in place. No organomegaly or free air.
IMPRESSION: No acute findings.  No evidence of bowel obstruction or free air.

## 2020-04-04 IMAGING — DX PORTABLE CHEST - 1 VIEW
1 series · 1 of 1 positions shown · non-contrast
Comparison: 03/08/2019

CLINICAL DATA: Evaluate for new onset low grade fever today - hx of
htn, DVT, hydrocephalus, stroke, seizure

EXAM:
PORTABLE CHEST 1 VIEW

[chest]
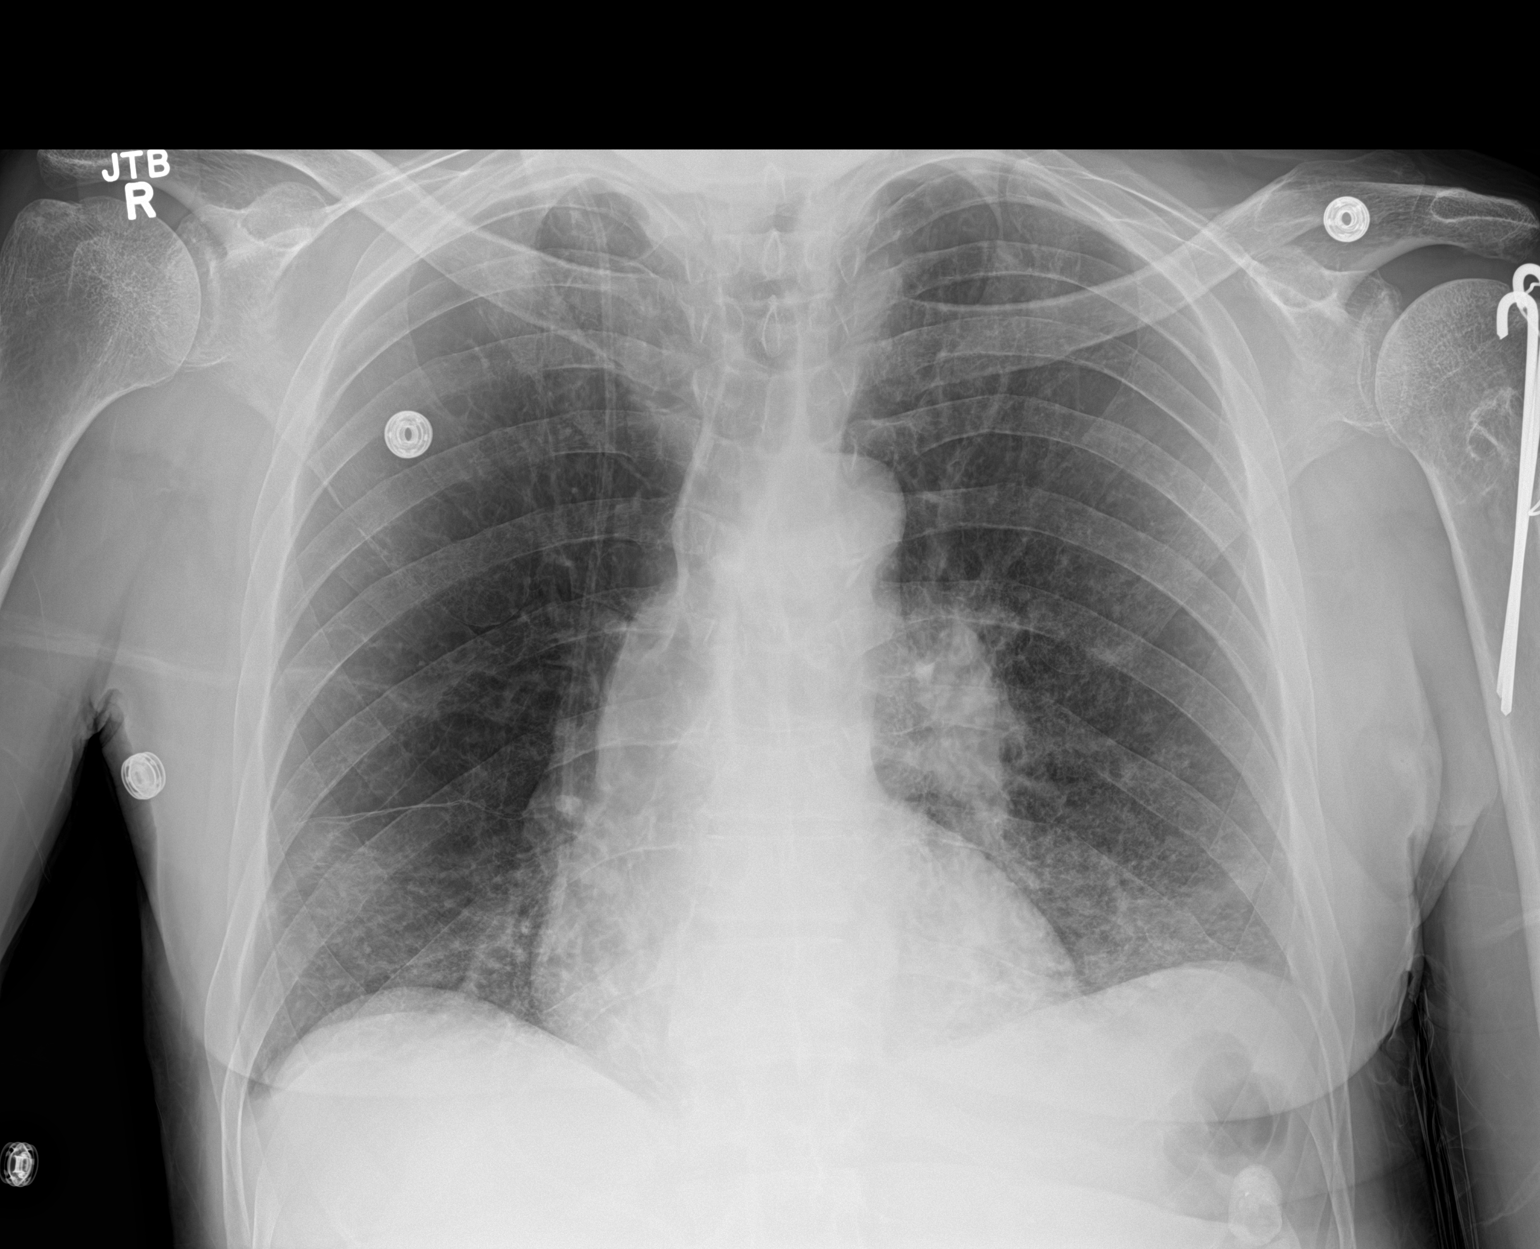

[1 of 1 positions shown; findings below may reference images not displayed]

FINDINGS: Film is made with more shallow lung inflation compared with multiple
prior exams. Heart size is normal. There is patchy density at both
lung bases. This could represent hypoinflation or early infiltrate.
No evidence for pulmonary edema. Patient has a ventriculoperitoneal
shunt, coursing across the RIGHT hemithorax. Remote ORIF of the LEFT
humerus.
IMPRESSION: Atelectasis versus early infiltrate at both lung bases.

## 2020-07-02 IMAGING — CT CT HEAD W/O CM
3 of 4 series · 15 of 47 positions shown, 18 images · non-contrast
Comparison: 03/07/2019

CLINICAL DATA: In cephalopathy

EXAM:
CT HEAD WITHOUT CONTRAST
TECHNIQUE: Contiguous axial images were obtained from the base of the skull
through the vertex without intravenous contrast.

[Series 2: head wo · axial · 0.47mm/px · z∈[+1355,+1500]mm · 9 of 35 slices shown, 12 images]
[im 3/35  brain]
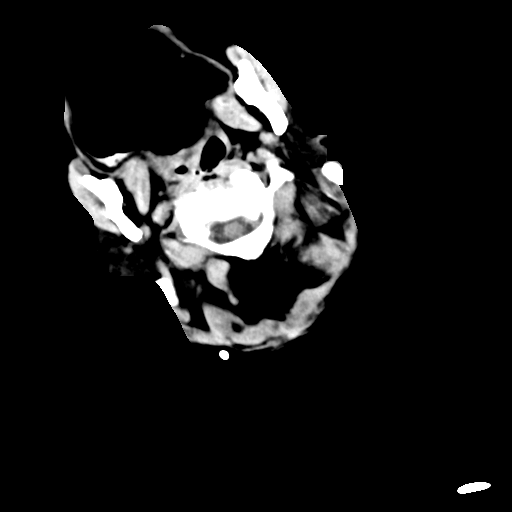
[im 3/35  bone]
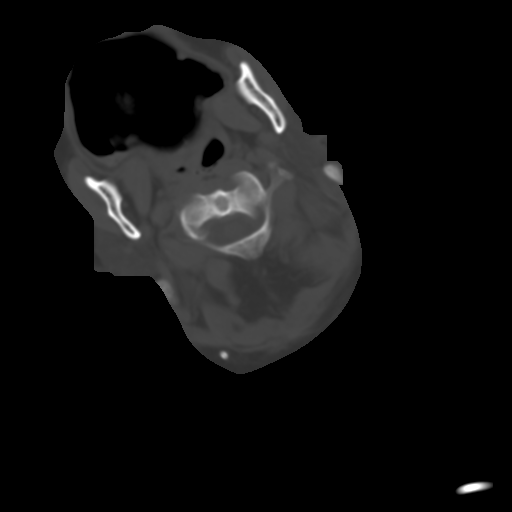
[im 8/35  brain]
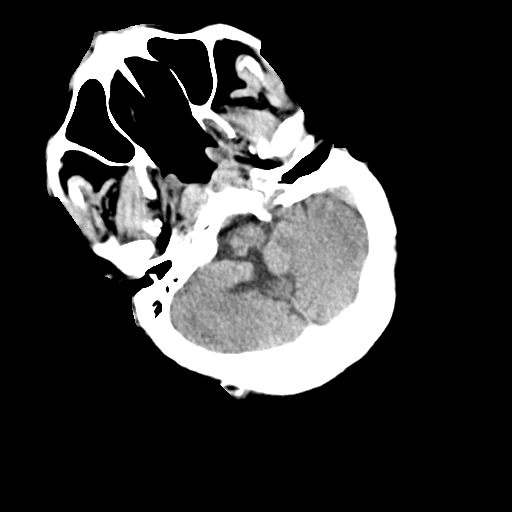
[im 10/35  brain]
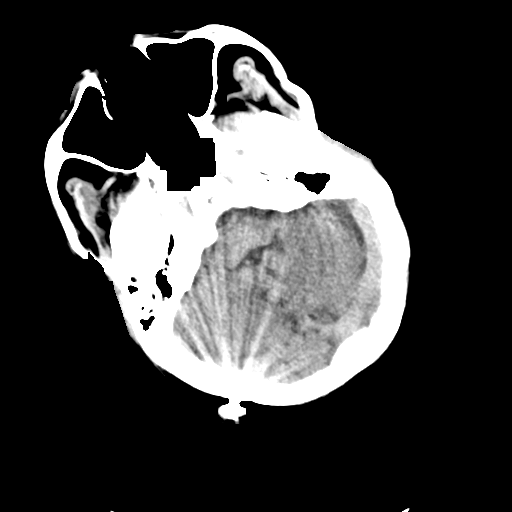
[im 15/35  brain]
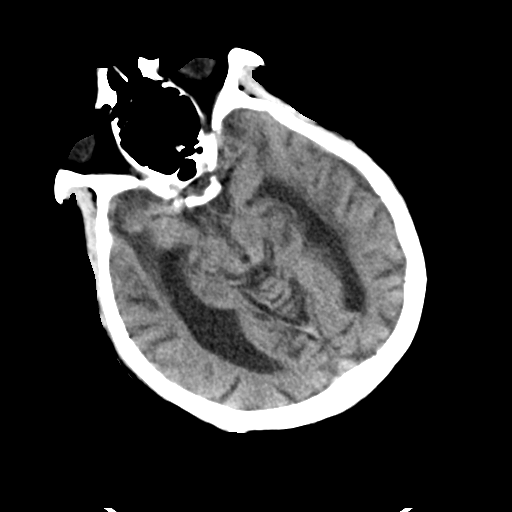
[im 18/35  brain]
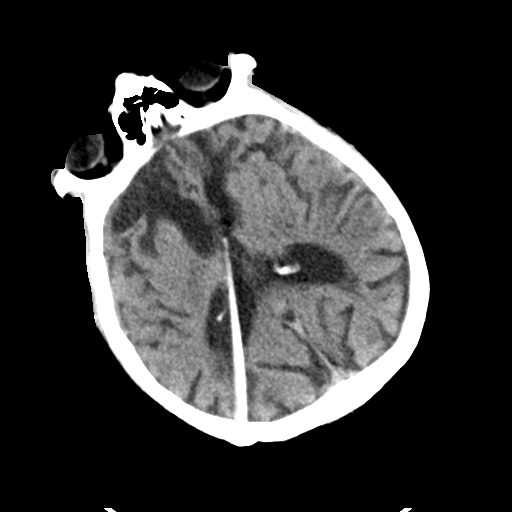
[im 18/35  bone]
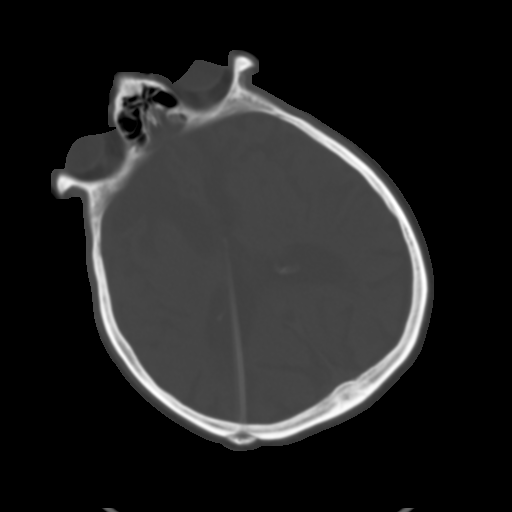
[im 20/35  brain]
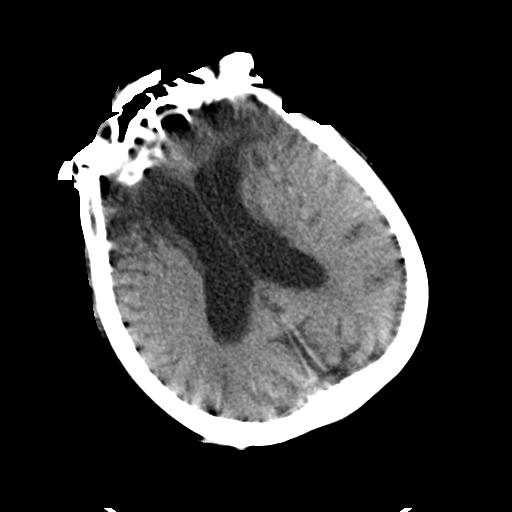
[im 25/35  brain]
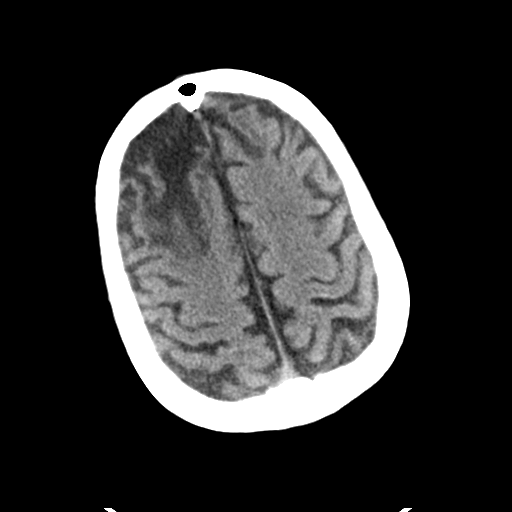
[im 27/35  brain]
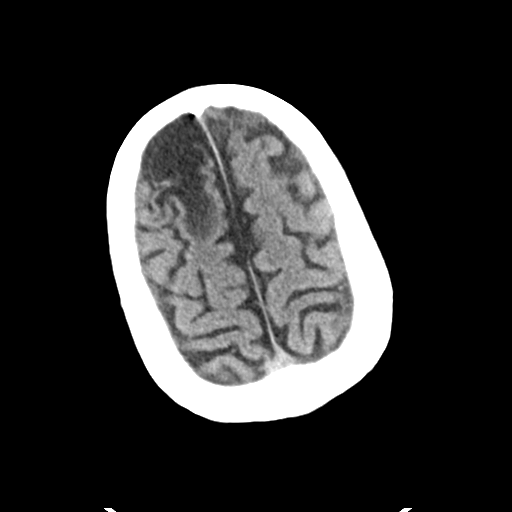
[im 32/35  brain]
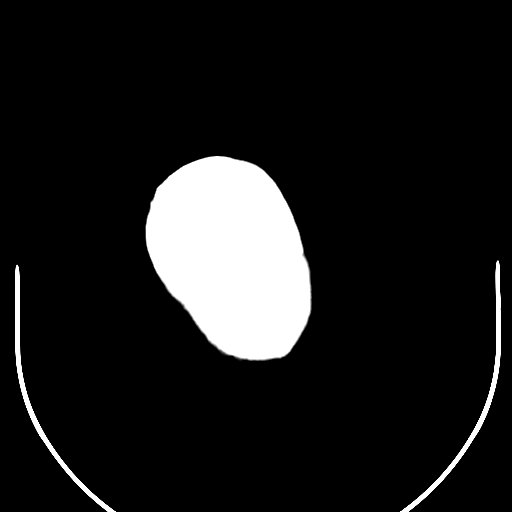
[im 32/35  bone]
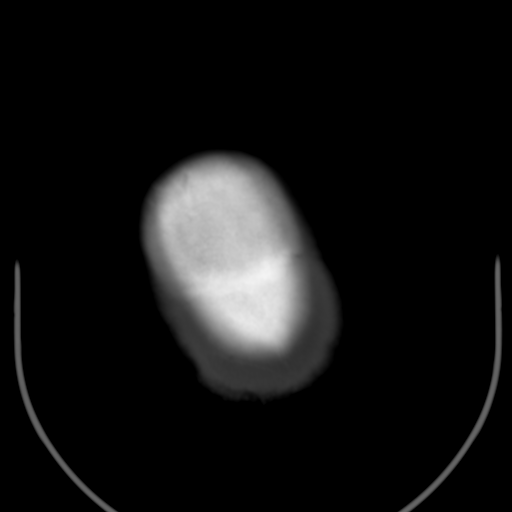

[Series 4: coronal soft tissue · coronal · 0.31mm/px · 3 of 67 slices shown]
[im 23/67  brain]
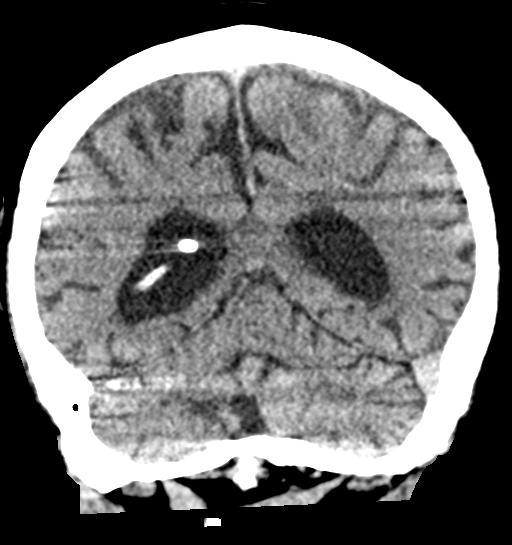
[im 30/67  brain]
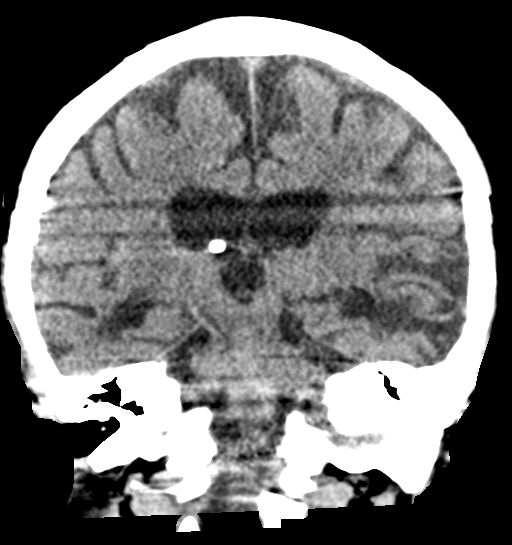
[im 37/67  brain]
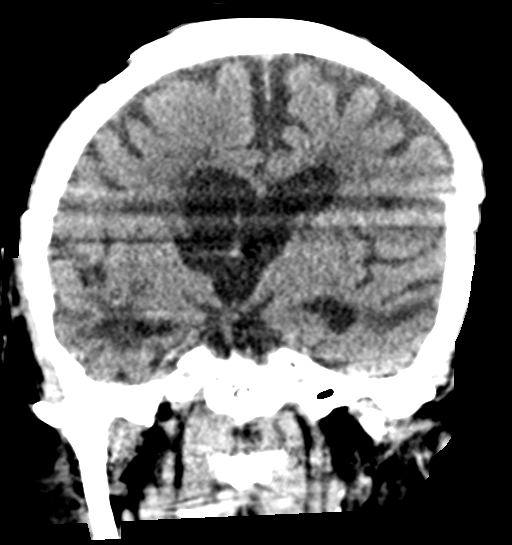

[Series 5: sagittal soft tissue · sagittal · 0.32mm/px · 3 of 51 slices shown]
[im 17/51  brain]
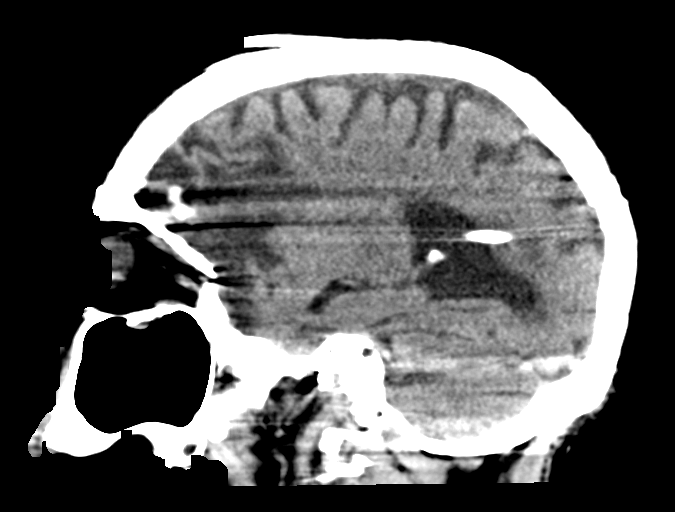
[im 26/51  brain]
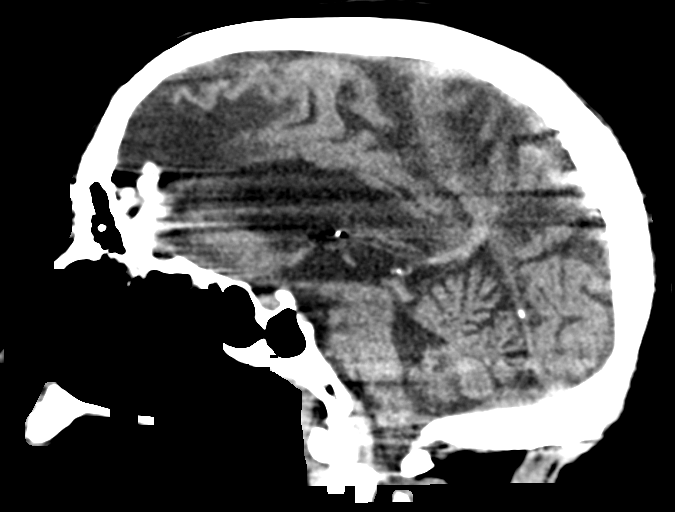
[im 34/51  brain]
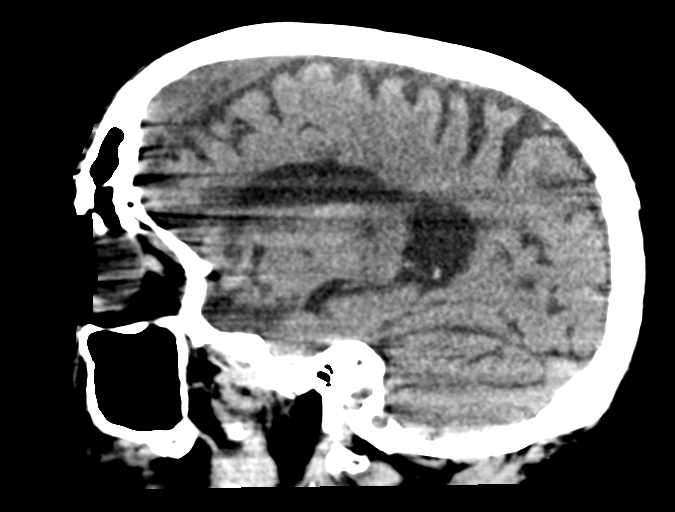

[15 of 47 positions shown; findings below may reference images not displayed]

FINDINGS: Brain: Right parietal ventriculoperitoneal shunt again seen in
stable position. Bifrontal infarcts are again noted, unchanged with
encephalomalacia. There is atrophy and chronic small vessel disease
changes. No visible acute infarct or hemorrhage. Old right basal
ganglia lacunar infarct. Mild ventriculomegaly is stable since prior
study.

Vascular: No hyperdense vessel or unexpected calcification.

Skull: No acute calvarial abnormality.

Sinuses/Orbits: Visualized paranasal sinuses and mastoids clear.
Orbital soft tissues unremarkable.

Other: None
IMPRESSION: Right parietal VP shunt in stable position. Stable ventricular size.

Atrophy, chronic small vessel disease.

Old bilateral frontal infarcts with encephalomalacia.

No acute intracranial abnormality.

## 2020-07-03 IMAGING — DX DG CHEST 1V PORT
1 series · 1 of 1 positions shown · non-contrast
Comparison: CTA earlier this day. Chest radiograph yesterday.

CLINICAL DATA: Intubation. Line placement.

EXAM:
PORTABLE CHEST 1 VIEW

[chest ap]
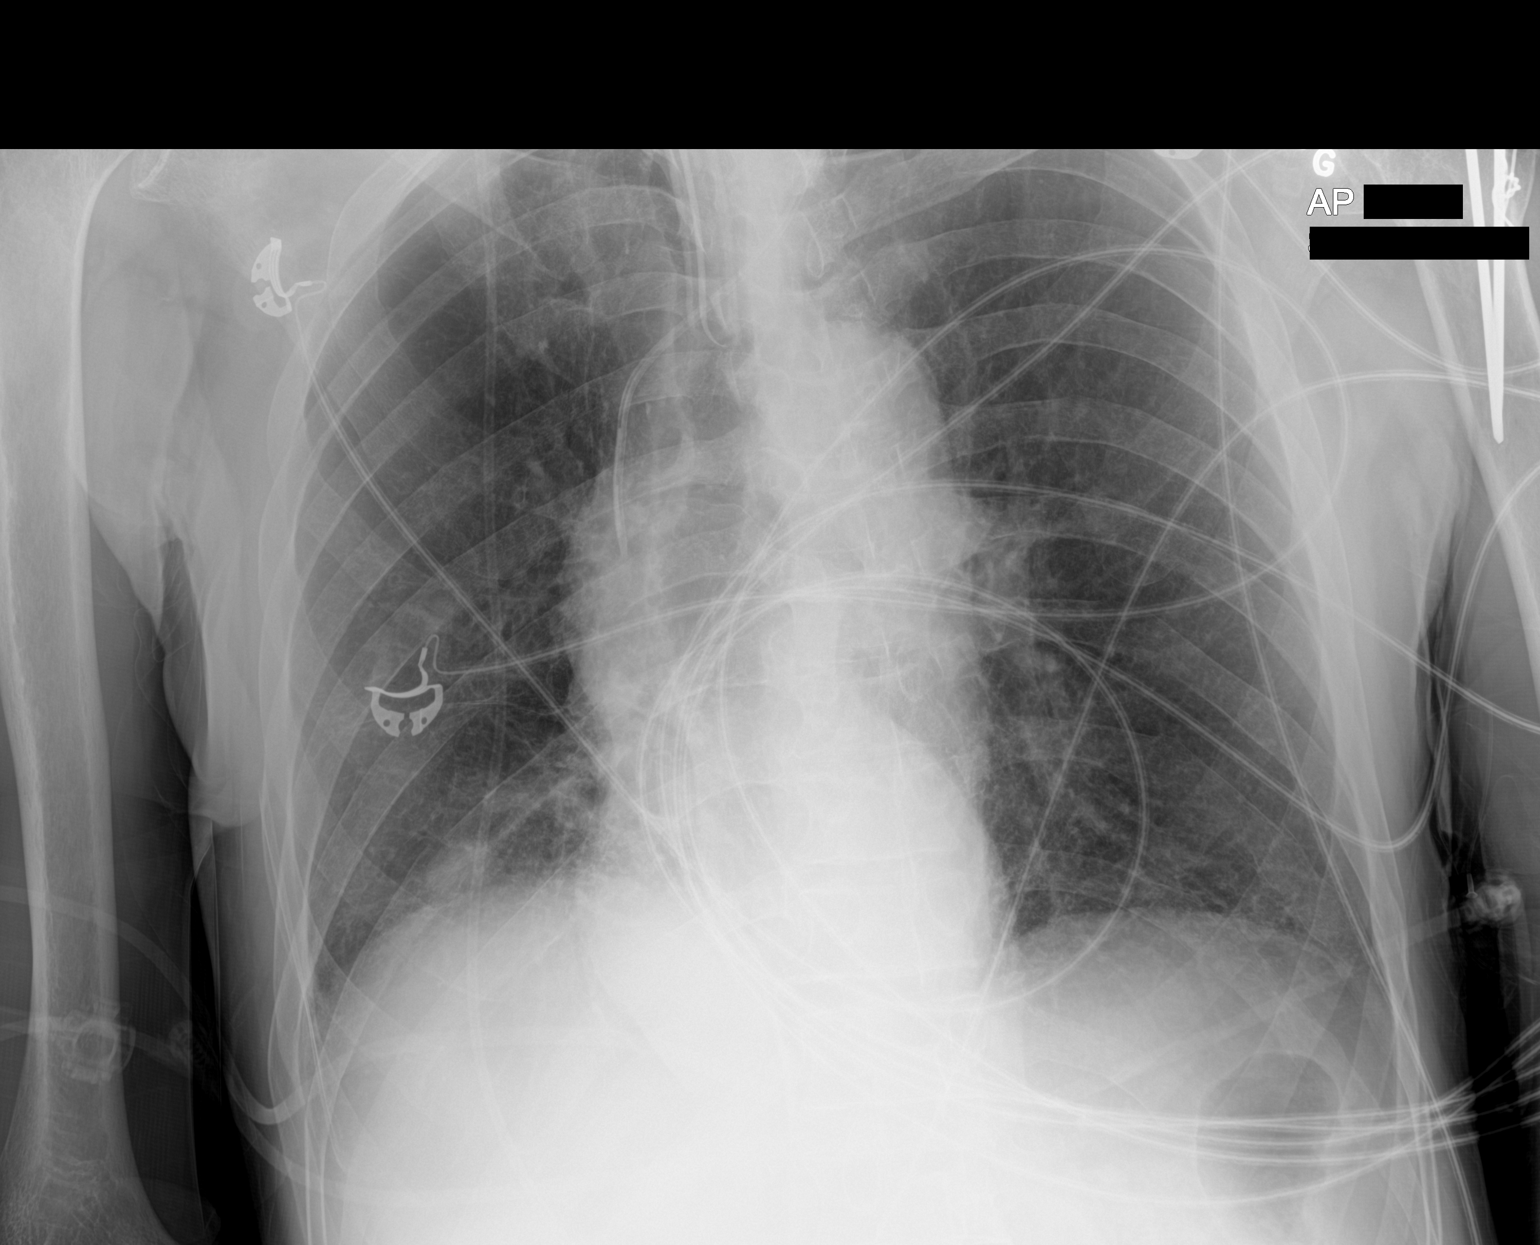

[1 of 1 positions shown; findings below may reference images not displayed]

FINDINGS: Endotracheal tube tip 4.3 cm from the carina. Left internal jugular
central line tip in the mid SVC. No pneumothorax. Unchanged heart
size and mediastinal contours with aortic tortuosity. Right lung
base atelectasis with 2.1 cm right lower lobe nodule. Emphysematous
changes on CT not as well demonstrated. No new airspace disease. No
significant pleural fluid. Ventriculoperitoneal shunt catheter
tubing courses over the right chest. K-wires in the left proximal
humerus.
IMPRESSION: 1. Endotracheal tube tip 4.3 cm from the carina.
2. Left central line tip in the mid SVC.  No pneumothorax.
3. Right lung base atelectasis with 2.1 cm right lower lobe nodule,
as seen on recent CT.

## 2020-07-03 IMAGING — CT CT CTA ABD/PEL W/CM AND/OR W/O CM
2 of 6 series · 12 of 46 positions shown, 14 images · IV contrast (omnipaque)
Comparison: None.

CLINICAL DATA: Recent diagnosis of COVID. Elevated lactic acid.
Evaluate for thrombosis. Respiratory distress.

EXAM:
CT ANGIOGRAPHY CHEST, ABDOMEN AND PELVIS
TECHNIQUE: Multidetector CT imaging through the chest, abdomen and pelvis was
performed using the standard protocol during bolus administration of
intravenous contrast. Multiplanar reconstructed images and MIPs were
obtained and reviewed to evaluate the vascular anatomy.
CONTRAST:  80mL OMNIPAQUE IOHEXOL 350 MG/ML SOLN

[Series 5: axial arterial · axial · arterial · 0.66mm/px · z∈[+686,+1217]mm · 9 of 212 slices shown, 11 images]
[im 23/212  soft-tissue]
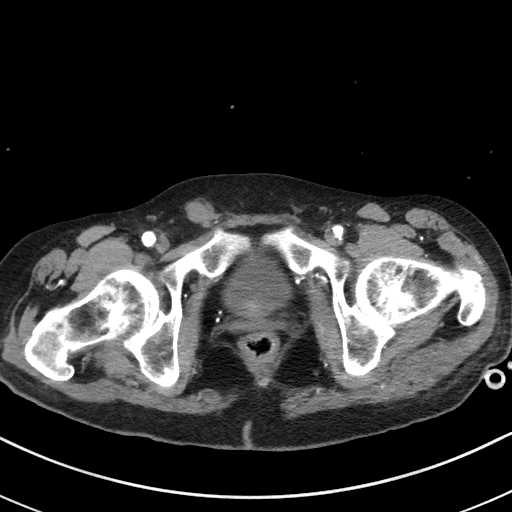
[im 23/212  bone]
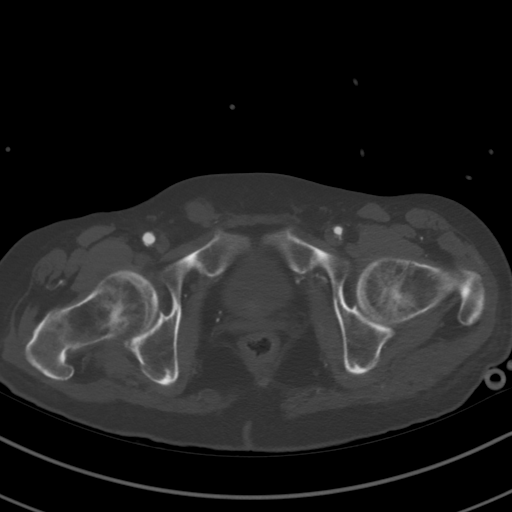
[im 45/212  soft-tissue]
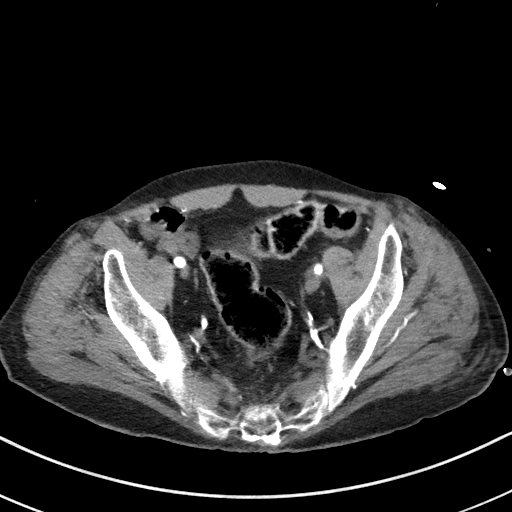
[im 67/212  soft-tissue]
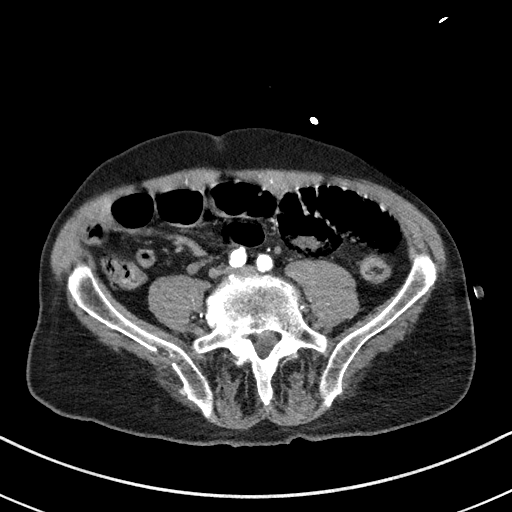
[im 89/212  soft-tissue]
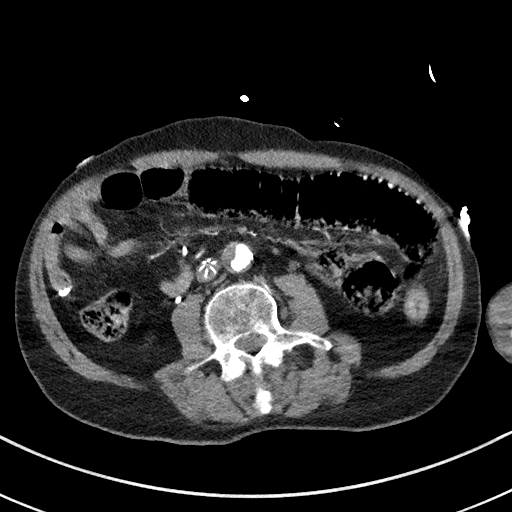
[im 112/212  soft-tissue]
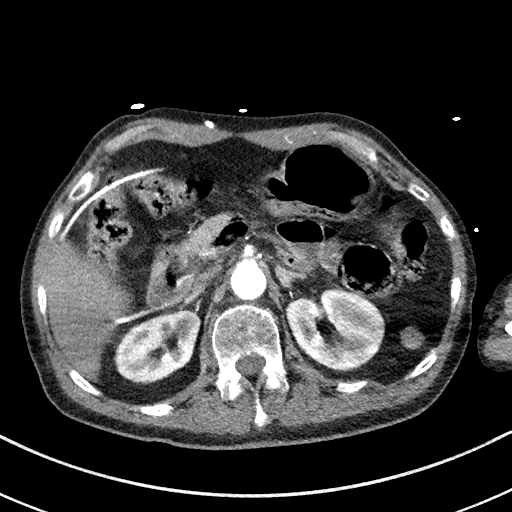
[im 134/212  soft-tissue]
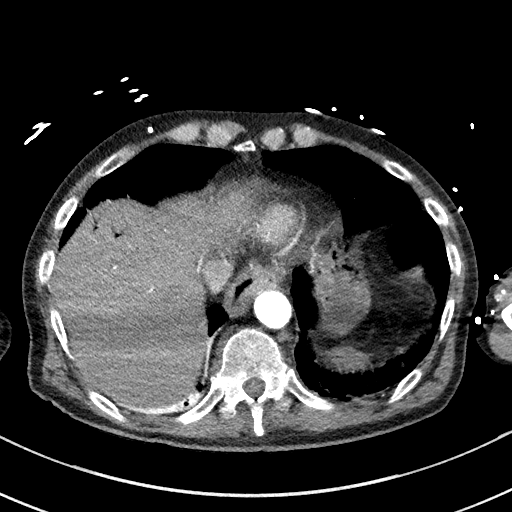
[im 156/212  soft-tissue]
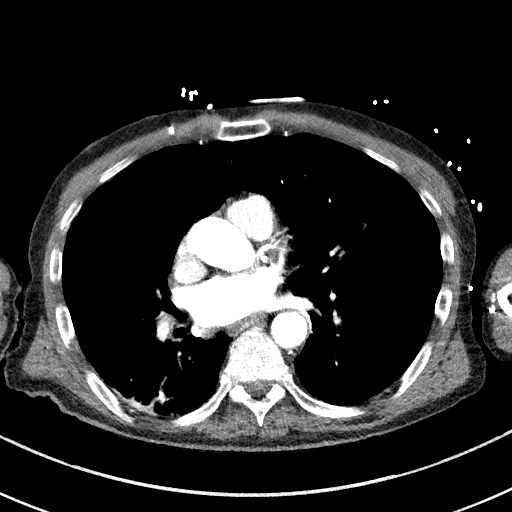
[im 178/212  soft-tissue]
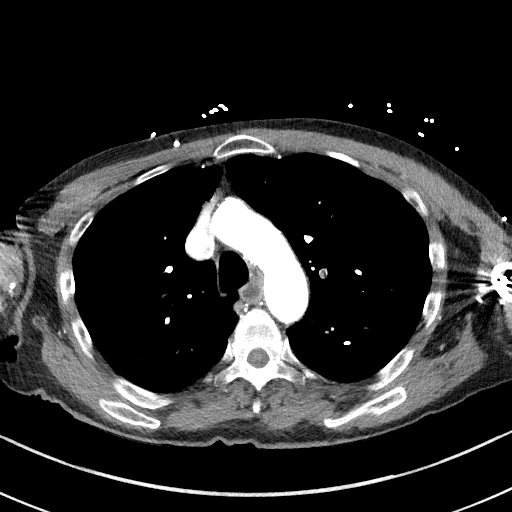
[im 200/212  soft-tissue]
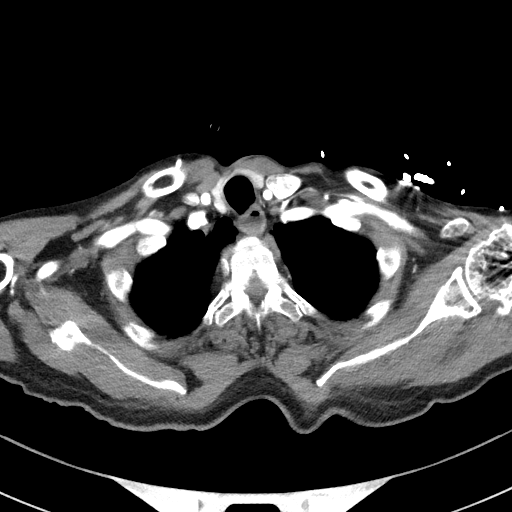
[im 200/212  bone]
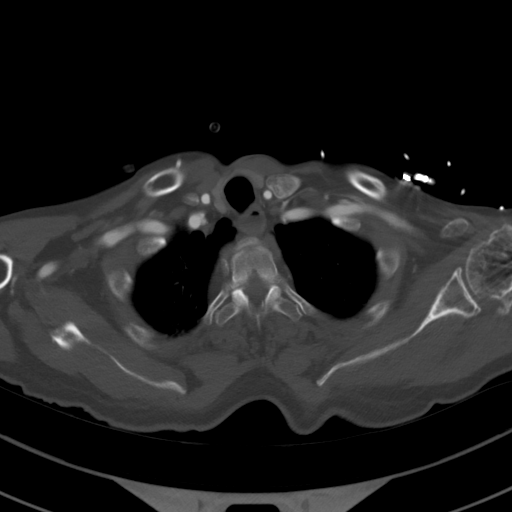

[Series 8: coronals · coronal · 0.62mm/px · 3 of 114 slices shown]
[im 29/114  soft-tissue]
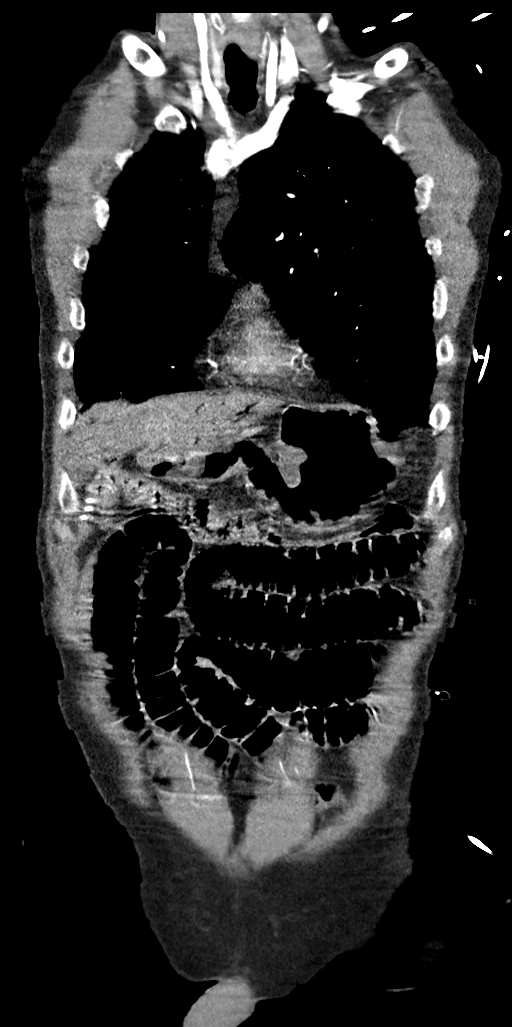
[im 57/114  soft-tissue]
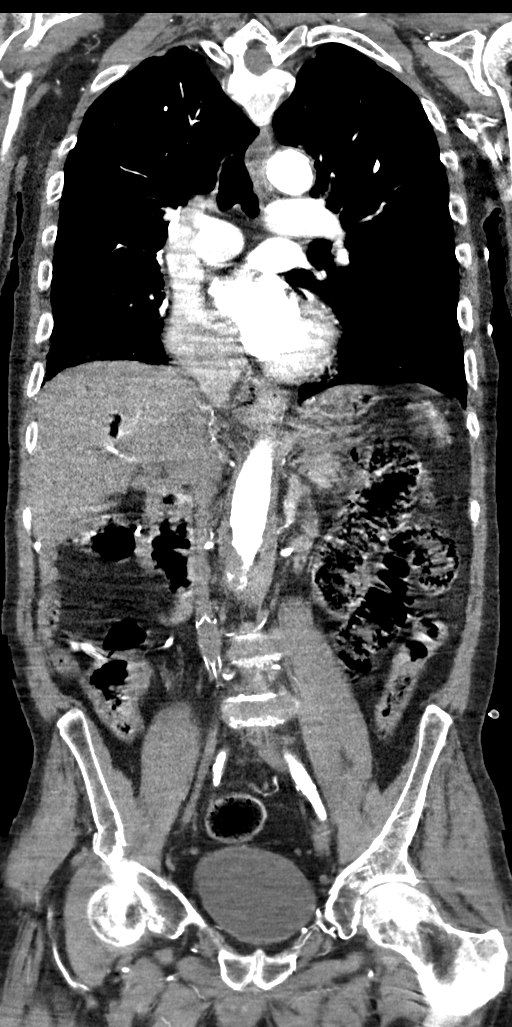
[im 85/114  soft-tissue]
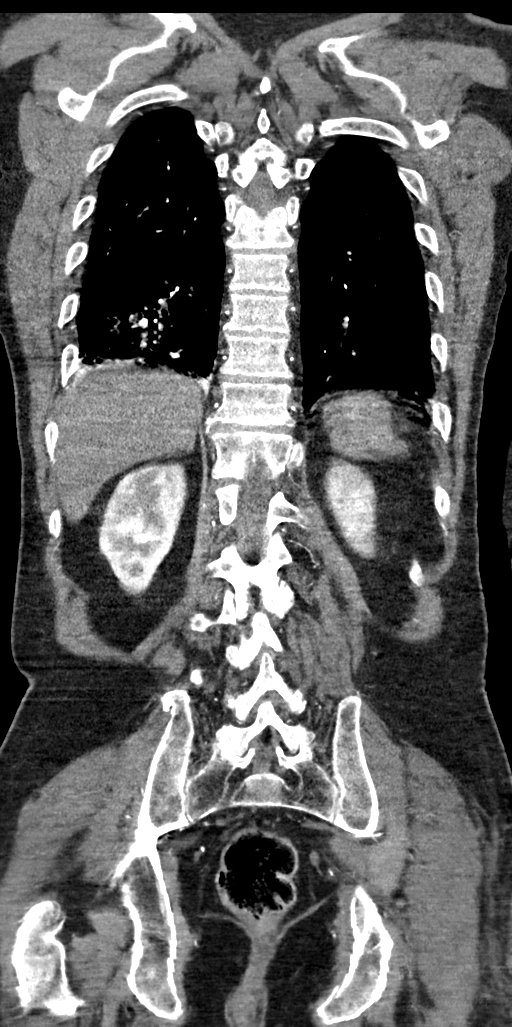

[12 of 46 positions shown; findings below may reference images not displayed]

FINDINGS: CTA CHEST FINDINGS

Cardiovascular: Heart is normal size. Aorta is normal caliber. No
dissection. Great vessels are patent. Mild atherosclerotic disease
with calcifications in the aortic arch and proximal great vessels.
Filling defect within a left upper lobe pulmonary arterial branch
compatible with small pulmonary embolus. No other visible pulmonary
emboli. Scattered coronary artery calcifications 1.

Mediastinum/Nodes: No mediastinal, hilar, or axillary adenopathy.

Lungs/Pleura: Moderate to advanced a centrilobular and paraseptal
emphysema. 2.2 cm rounded nodule in the right lower lobe at the
right lung base. Consolidation in the right lower lobe posteriorly
concerning for pneumonia. No effusions.

Musculoskeletal: Ventriculoperitoneal shunt catheter noted in the
anterior chest wall. No acute bony abnormality.

Review of the MIP images confirms the above findings.

CTA ABDOMEN AND PELVIS FINDINGS

VASCULAR

Aorta: Aorta is normal caliber. Moderate calcified and noncalcified
plaque in the mid and distal aorta. No dissection.

Celiac: Patent without evidence of aneurysm, dissection, vasculitis
or significant stenosis.

SMA: Patent without evidence of aneurysm, dissection, vasculitis or
significant stenosis.

Renals: Both renal arteries are patent without evidence of aneurysm,
dissection, vasculitis, fibromuscular dysplasia or significant
stenosis.

IMA: Patent without evidence of aneurysm, dissection, vasculitis or
significant stenosis.

Inflow: Atherosclerosis.  No aneurysm or dissection.

Veins: IVC filter in place.  No obvious venous abnormality.

Review of the MIP images confirms the above findings.

NON-VASCULAR

Hepatobiliary: There is portal venous gas noted in the intrahepatic
portal venous branches, main portal vein, and superior mesenteric
vein. No focal hepatic abnormality. No biliary ductal dilatation.
Gallbladder grossly unremarkable.

Pancreas: No focal abnormality or ductal dilatation.

Spleen: No focal abnormality.  Normal size.

Adrenals/Urinary Tract: No adrenal abnormality. No focal renal
abnormality. No stones or hydronephrosis. Urinary bladder is
unremarkable.

Stomach/Bowel: Extensive abnormally dilated small bowel loops in the
abdomen and pelvis. Distal small bowel loops are decompressed
suggesting small-bowel obstruction. Within the dilated small bowel
loops, there is mottled appearance of material layering as well as
mottled appearance along the wall concerning for pneumatosis, likely
the source of the portal venous gas. Large bowel is decompressed.
Stomach grossly unremarkable. Gastrostomy tube in place within the
stomach.

Lymphatic: No adenopathy.

Reproductive: No visible focal abnormality.

Other: No free fluid or free air.

Musculoskeletal: No acute bony abnormality.

Review of the MIP images confirms the above findings.
IMPRESSION: Abnormal appearance of small bowel loops in the abdomen and pelvis
which are dilated. Areas of pneumatosis suspected. Findings are
concerning for ischemic bowel. Distal small bowel is decompressed
suggesting a component of partial small bowel obstruction as well.

Associated extensive portal venous gas.  No free air.

No evidence of aortic aneurysm or dissection. Aortic
atherosclerosis. Coronary artery disease.

Small pulmonary embolus within the left upper lobe.

Coronary artery disease.

Moderate to advanced emphysema.

2.2 cm right lower lobe pulmonary nodule.

Consolidation in the right lower lobe concerning for pneumonia.

Critical Value/emergent results were called by telephone at the time
of interpretation on 06/17/2019 at [DATE] to Mereckas
NIINII , who verbally acknowledged these results.

## 2020-07-03 IMAGING — CT CT ANGIO CHEST
2 of 7 series · 14 of 46 positions shown · IV contrast (OMNIPAQUE)
Comparison: None.

CLINICAL DATA: Recent diagnosis of COVID. Elevated lactic acid.
Evaluate for thrombosis. Respiratory distress.

EXAM:
CT ANGIOGRAPHY CHEST, ABDOMEN AND PELVIS
TECHNIQUE: Multidetector CT imaging through the chest, abdomen and pelvis was
performed using the standard protocol during bolus administration of
intravenous contrast. Multiplanar reconstructed images and MIPs were
obtained and reviewed to evaluate the vascular anatomy.
CONTRAST:  80mL OMNIPAQUE IOHEXOL 350 MG/ML SOLN

[Series 7: arterial thins · axial · arterial · 0.66mm/px · z∈[+666,+1206]mm · 11 of 2115 slices shown]
[im 157/2115  lung]
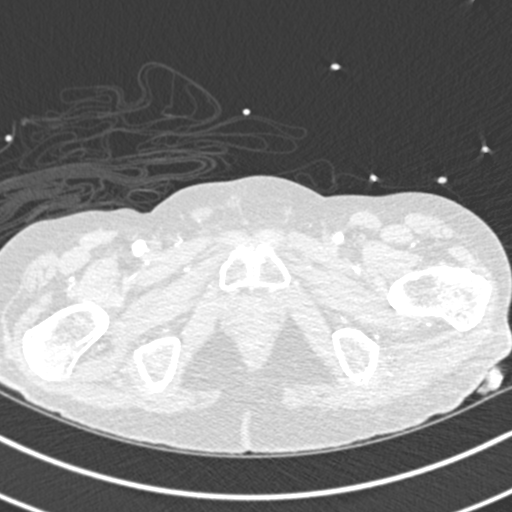
[im 314/2115  soft-tissue]
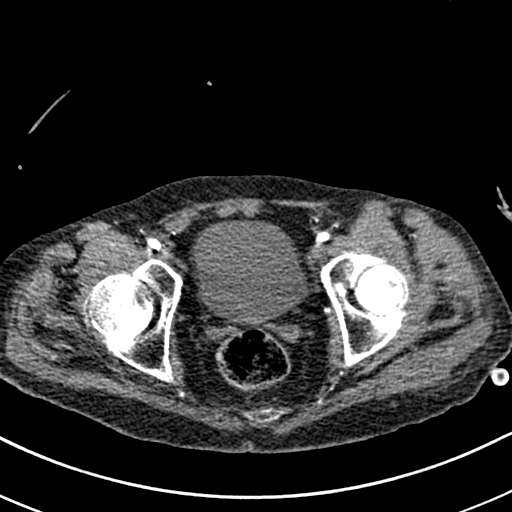
[im 470/2115  lung]
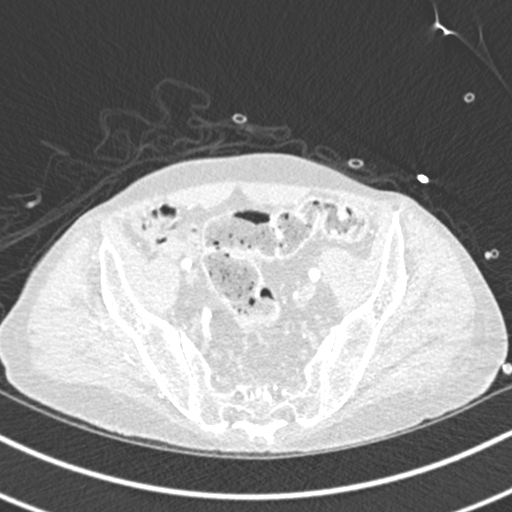
[im 705/2115  soft-tissue]
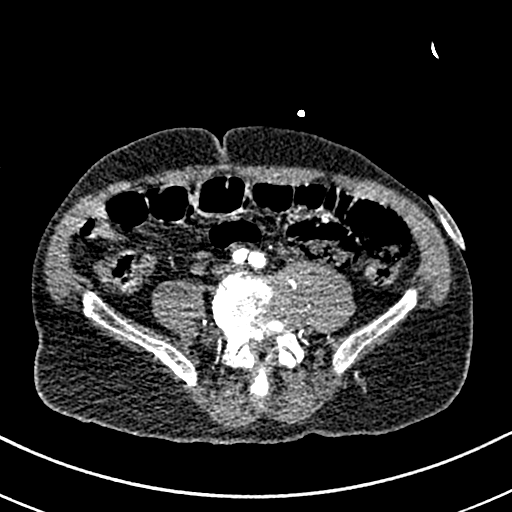
[im 862/2115  lung]
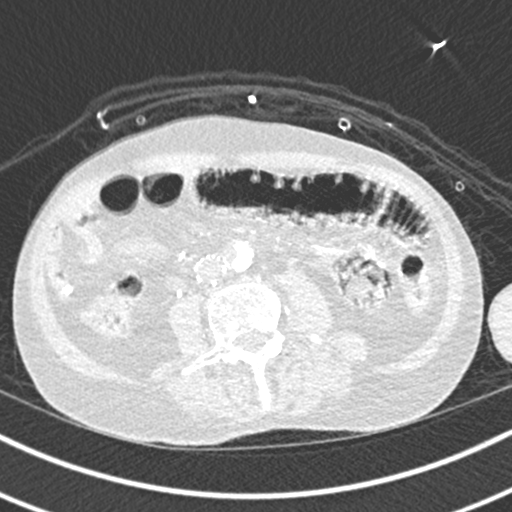
[im 1097/2115  soft-tissue]
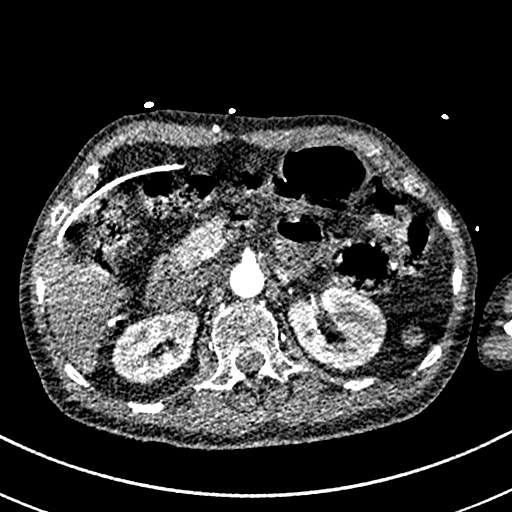
[im 1253/2115  lung]
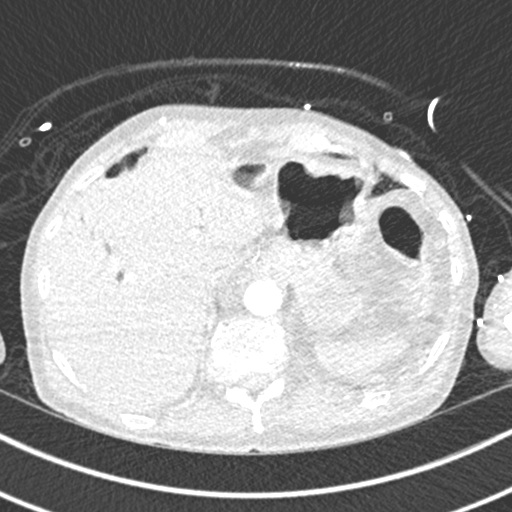
[im 1410/2115  soft-tissue]
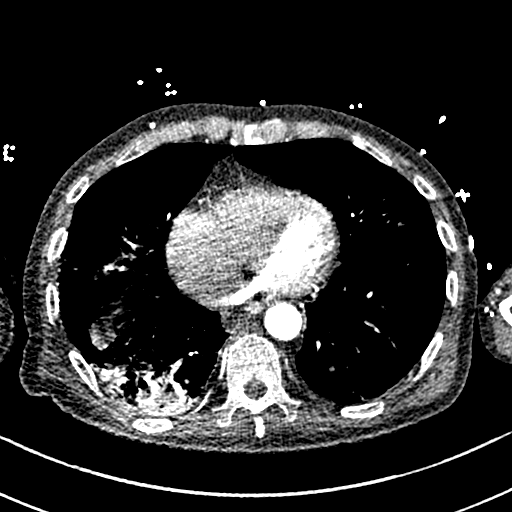
[im 1645/2115  lung]
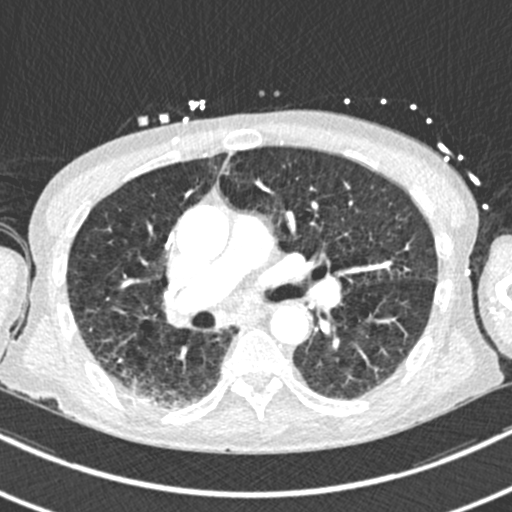
[im 1801/2115  soft-tissue]
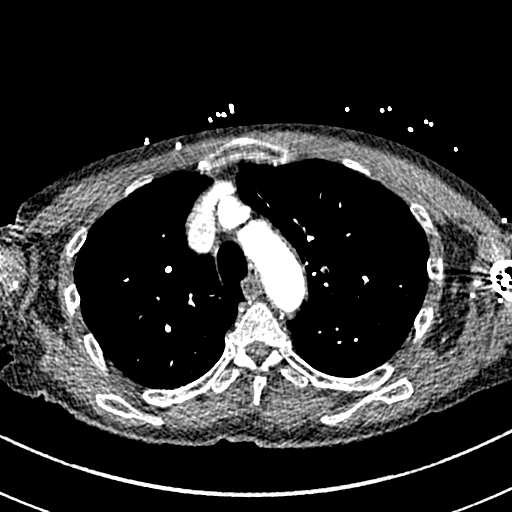
[im 1958/2115  lung]
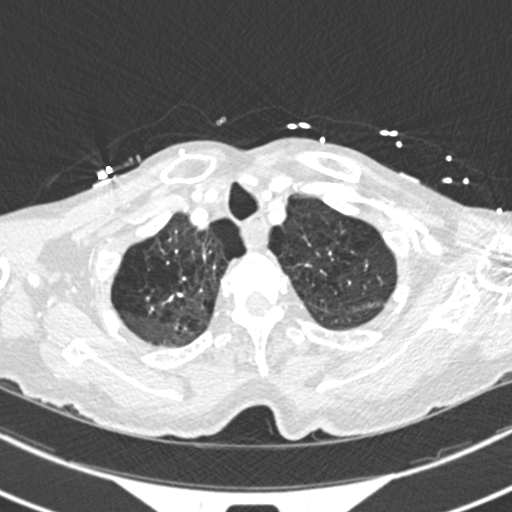

[Series 8: coronals · coronal · 0.62mm/px · 3 of 114 slices shown]
[im 29/114  soft-tissue]
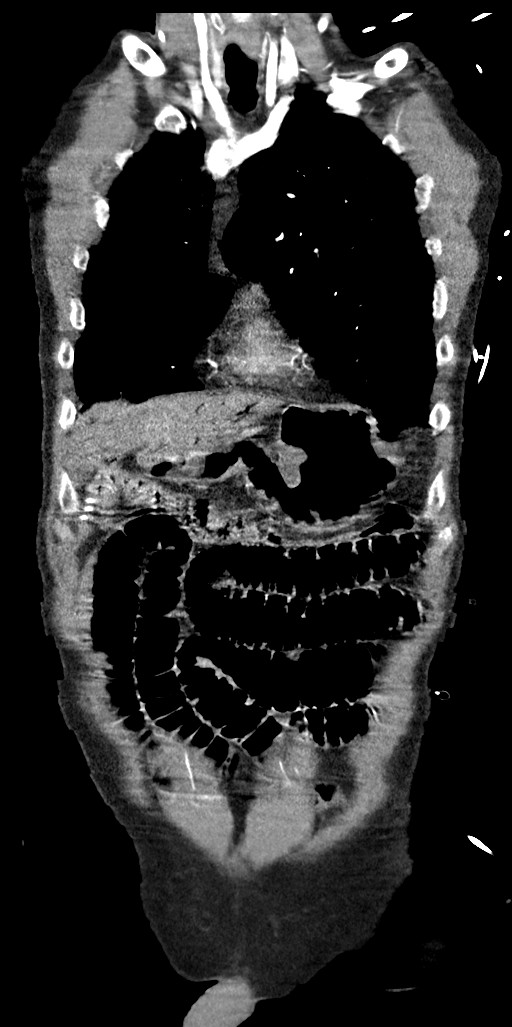
[im 57/114  soft-tissue]
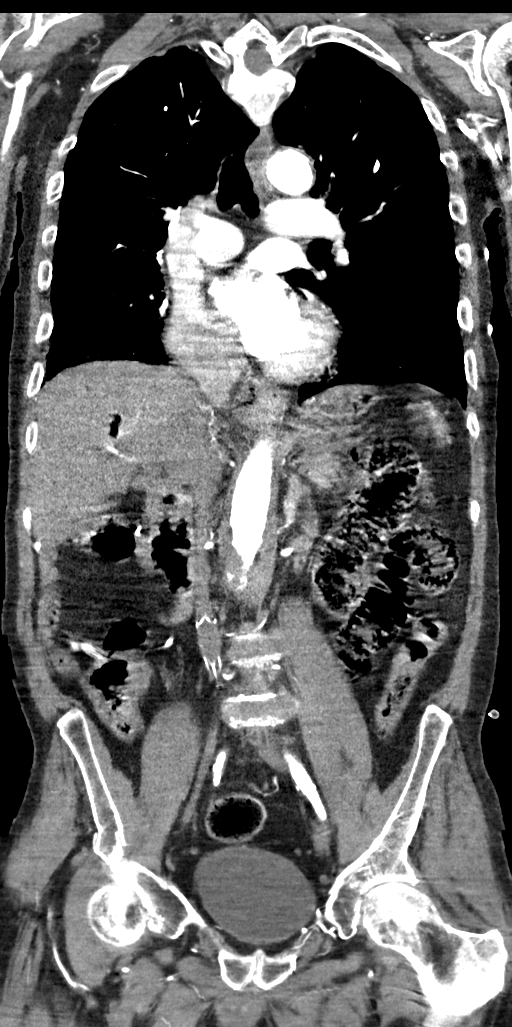
[im 85/114  soft-tissue]
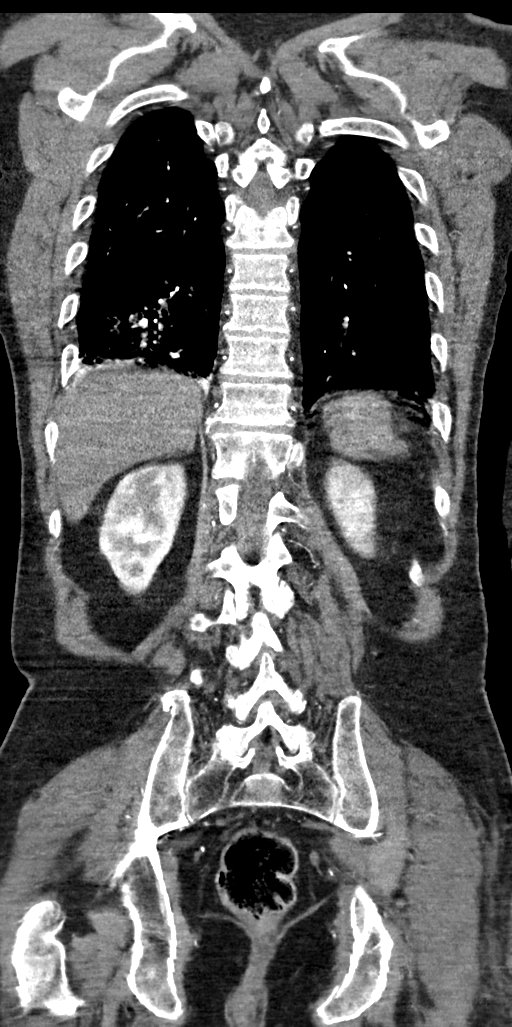

[14 of 46 positions shown; findings below may reference images not displayed]

FINDINGS: CTA CHEST FINDINGS

Cardiovascular: Heart is normal size. Aorta is normal caliber. No
dissection. Great vessels are patent. Mild atherosclerotic disease
with calcifications in the aortic arch and proximal great vessels.
Filling defect within a left upper lobe pulmonary arterial branch
compatible with small pulmonary embolus. No other visible pulmonary
emboli. Scattered coronary artery calcifications 1.

Mediastinum/Nodes: No mediastinal, hilar, or axillary adenopathy.

Lungs/Pleura: Moderate to advanced a centrilobular and paraseptal
emphysema. 2.2 cm rounded nodule in the right lower lobe at the
right lung base. Consolidation in the right lower lobe posteriorly
concerning for pneumonia. No effusions.

Musculoskeletal: Ventriculoperitoneal shunt catheter noted in the
anterior chest wall. No acute bony abnormality.

Review of the MIP images confirms the above findings.

CTA ABDOMEN AND PELVIS FINDINGS

VASCULAR

Aorta: Aorta is normal caliber. Moderate calcified and noncalcified
plaque in the mid and distal aorta. No dissection.

Celiac: Patent without evidence of aneurysm, dissection, vasculitis
or significant stenosis.

SMA: Patent without evidence of aneurysm, dissection, vasculitis or
significant stenosis.

Renals: Both renal arteries are patent without evidence of aneurysm,
dissection, vasculitis, fibromuscular dysplasia or significant
stenosis.

IMA: Patent without evidence of aneurysm, dissection, vasculitis or
significant stenosis.

Inflow: Atherosclerosis.  No aneurysm or dissection.

Veins: IVC filter in place.  No obvious venous abnormality.

Review of the MIP images confirms the above findings.

NON-VASCULAR

Hepatobiliary: There is portal venous gas noted in the intrahepatic
portal venous branches, main portal vein, and superior mesenteric
vein. No focal hepatic abnormality. No biliary ductal dilatation.
Gallbladder grossly unremarkable.

Pancreas: No focal abnormality or ductal dilatation.

Spleen: No focal abnormality.  Normal size.

Adrenals/Urinary Tract: No adrenal abnormality. No focal renal
abnormality. No stones or hydronephrosis. Urinary bladder is
unremarkable.

Stomach/Bowel: Extensive abnormally dilated small bowel loops in the
abdomen and pelvis. Distal small bowel loops are decompressed
suggesting small-bowel obstruction. Within the dilated small bowel
loops, there is mottled appearance of material layering as well as
mottled appearance along the wall concerning for pneumatosis, likely
the source of the portal venous gas. Large bowel is decompressed.
Stomach grossly unremarkable. Gastrostomy tube in place within the
stomach.

Lymphatic: No adenopathy.

Reproductive: No visible focal abnormality.

Other: No free fluid or free air.

Musculoskeletal: No acute bony abnormality.

Review of the MIP images confirms the above findings.
IMPRESSION: Abnormal appearance of small bowel loops in the abdomen and pelvis
which are dilated. Areas of pneumatosis suspected. Findings are
concerning for ischemic bowel. Distal small bowel is decompressed
suggesting a component of partial small bowel obstruction as well.

Associated extensive portal venous gas.  No free air.

No evidence of aortic aneurysm or dissection. Aortic
atherosclerosis. Coronary artery disease.

Small pulmonary embolus within the left upper lobe.

Coronary artery disease.

Moderate to advanced emphysema.

2.2 cm right lower lobe pulmonary nodule.

Consolidation in the right lower lobe concerning for pneumonia.

Critical Value/emergent results were called by telephone at the time
of interpretation on 06/17/2019 at [DATE] to Mereckas
NIINII , who verbally acknowledged these results.
